# Patient Record
Sex: Male | Born: 1990 | Race: White | Hispanic: No | Marital: Married | Smoking: Current every day smoker
Health system: Southern US, Community
[De-identification: ages and names within clinical notes are randomized; demographics above are authoritative.]

## PROBLEM LIST (undated history)

## (undated) DIAGNOSIS — N179 Acute kidney failure, unspecified: Secondary | ICD-10-CM

## (undated) DIAGNOSIS — M6282 Rhabdomyolysis: Secondary | ICD-10-CM

## (undated) DIAGNOSIS — J45909 Unspecified asthma, uncomplicated: Secondary | ICD-10-CM

## (undated) DIAGNOSIS — K649 Unspecified hemorrhoids: Secondary | ICD-10-CM

## (undated) DIAGNOSIS — N529 Male erectile dysfunction, unspecified: Secondary | ICD-10-CM

## (undated) DIAGNOSIS — F1121 Opioid dependence, in remission: Secondary | ICD-10-CM

## (undated) DIAGNOSIS — F329 Major depressive disorder, single episode, unspecified: Secondary | ICD-10-CM

## (undated) DIAGNOSIS — Z89611 Acquired absence of right leg above knee: Secondary | ICD-10-CM

## (undated) DIAGNOSIS — G935 Compression of brain: Secondary | ICD-10-CM

## (undated) DIAGNOSIS — F4323 Adjustment disorder with mixed anxiety and depressed mood: Secondary | ICD-10-CM

## (undated) DIAGNOSIS — I469 Cardiac arrest, cause unspecified: Secondary | ICD-10-CM

## (undated) DIAGNOSIS — F1910 Other psychoactive substance abuse, uncomplicated: Secondary | ICD-10-CM

## (undated) DIAGNOSIS — T50901A Poisoning by unspecified drugs, medicaments and biological substances, accidental (unintentional), initial encounter: Secondary | ICD-10-CM

## (undated) DIAGNOSIS — J40 Bronchitis, not specified as acute or chronic: Secondary | ICD-10-CM

## (undated) DIAGNOSIS — J690 Pneumonitis due to inhalation of food and vomit: Secondary | ICD-10-CM

## (undated) DIAGNOSIS — R569 Unspecified convulsions: Secondary | ICD-10-CM

## (undated) DIAGNOSIS — Z720 Tobacco use: Secondary | ICD-10-CM

## (undated) DIAGNOSIS — G629 Polyneuropathy, unspecified: Secondary | ICD-10-CM

## (undated) DIAGNOSIS — R251 Tremor, unspecified: Secondary | ICD-10-CM

## (undated) DIAGNOSIS — I10 Essential (primary) hypertension: Secondary | ICD-10-CM

## (undated) DIAGNOSIS — T8859XA Other complications of anesthesia, initial encounter: Secondary | ICD-10-CM

## (undated) DIAGNOSIS — R570 Cardiogenic shock: Secondary | ICD-10-CM

## (undated) DIAGNOSIS — F1410 Cocaine abuse, uncomplicated: Secondary | ICD-10-CM

## (undated) DIAGNOSIS — D649 Anemia, unspecified: Secondary | ICD-10-CM

## (undated) DIAGNOSIS — F411 Generalized anxiety disorder: Secondary | ICD-10-CM

## (undated) DIAGNOSIS — Z8709 Personal history of other diseases of the respiratory system: Secondary | ICD-10-CM

## (undated) HISTORY — PX: OTHER SURGICAL HISTORY: SHX169

## (undated) HISTORY — PX: CERVICAL SPINE SURGERY: SHX589

## (undated) HISTORY — DX: Unspecified convulsions: R56.9

## (undated) HISTORY — DX: Polyneuropathy, unspecified: G62.9

---
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
## 2008-08-13 ENCOUNTER — Emergency Department: Payer: Self-pay | Admitting: Unknown Physician Specialty

## 2008-11-24 ENCOUNTER — Emergency Department: Payer: Self-pay | Admitting: Emergency Medicine

## 2010-04-21 ENCOUNTER — Emergency Department: Payer: Self-pay | Admitting: Emergency Medicine

## 2010-06-03 ENCOUNTER — Emergency Department: Payer: Self-pay | Admitting: Emergency Medicine

## 2010-11-18 ENCOUNTER — Emergency Department: Payer: Self-pay | Admitting: Internal Medicine

## 2011-08-15 ENCOUNTER — Emergency Department: Payer: Self-pay | Admitting: *Deleted

## 2011-08-21 ENCOUNTER — Emergency Department: Payer: Self-pay | Admitting: *Deleted

## 2011-12-07 ENCOUNTER — Emergency Department: Payer: Self-pay | Admitting: Emergency Medicine

## 2011-12-27 DIAGNOSIS — M79609 Pain in unspecified limb: Secondary | ICD-10-CM | POA: Insufficient documentation

## 2012-01-08 ENCOUNTER — Emergency Department: Payer: Self-pay | Admitting: Emergency Medicine

## 2012-02-02 ENCOUNTER — Emergency Department: Payer: Self-pay | Admitting: Emergency Medicine

## 2012-02-25 ENCOUNTER — Emergency Department: Payer: Self-pay | Admitting: *Deleted

## 2012-05-03 ENCOUNTER — Emergency Department: Payer: Self-pay | Admitting: Emergency Medicine

## 2012-05-04 ENCOUNTER — Emergency Department: Payer: Self-pay | Admitting: Emergency Medicine

## 2012-06-06 ENCOUNTER — Emergency Department: Payer: Self-pay | Admitting: Emergency Medicine

## 2012-06-06 LAB — URINALYSIS, COMPLETE
Blood: NEGATIVE
Glucose,UR: NEGATIVE mg/dL (ref 0–75)
Nitrite: NEGATIVE
Ph: 8 (ref 4.5–8.0)
Protein: NEGATIVE
Specific Gravity: 1.012 (ref 1.003–1.030)
WBC UR: <1 /HPF (ref 0–5)

## 2012-06-06 LAB — DRUG SCREEN, URINE
Amphetamines, Ur Screen: NEGATIVE (ref ?–1000)
Cocaine Metabolite,Ur ~~LOC~~: NEGATIVE (ref ?–300)
MDMA (Ecstasy)Ur Screen: NEGATIVE (ref ?–500)
Methadone, Ur Screen: NEGATIVE (ref ?–300)
Opiate, Ur Screen: POSITIVE (ref ?–300)
Phencyclidine (PCP) Ur S: NEGATIVE (ref ?–25)

## 2012-06-06 LAB — CBC
HCT: 47.8 % (ref 40.0–52.0)
HGB: 16.7 g/dL (ref 13.0–18.0)
MCH: 31.9 pg (ref 26.0–34.0)
MCHC: 35 g/dL (ref 32.0–36.0)
MCV: 91 fL (ref 80–100)
RBC: 5.24 10*6/uL (ref 4.40–5.90)
RDW: 12.9 % (ref 11.5–14.5)

## 2012-06-06 LAB — COMPREHENSIVE METABOLIC PANEL
Albumin: 4.7 g/dL (ref 3.4–5.0)
Calcium, Total: 9.1 mg/dL (ref 8.5–10.1)
Chloride: 105 mmol/L (ref 98–107)
Co2: 27 mmol/L (ref 21–32)
EGFR (African American): >60
EGFR (Non-African Amer.): >60
SGOT(AST): 25 U/L (ref 15–37)
SGPT (ALT): 22 U/L (ref 12–78)
Sodium: 142 mmol/L (ref 136–145)

## 2012-06-06 LAB — TSH: Thyroid Stimulating Horm: 0.32 u[IU]/mL — ABNORMAL LOW

## 2012-06-06 LAB — ETHANOL
Ethanol %: <0.003 % (ref 0.000–0.080)
Ethanol: <3 mg/dL

## 2013-02-06 ENCOUNTER — Emergency Department: Payer: Self-pay | Admitting: Emergency Medicine

## 2013-02-19 ENCOUNTER — Emergency Department: Payer: Self-pay | Admitting: Emergency Medicine

## 2013-02-19 LAB — URINALYSIS, COMPLETE
Bilirubin,UR: NEGATIVE
Blood: NEGATIVE
Nitrite: NEGATIVE
Protein: NEGATIVE
Specific Gravity: 1.006 (ref 1.003–1.030)

## 2013-02-19 LAB — COMPREHENSIVE METABOLIC PANEL
Albumin: 4.5 g/dL (ref 3.4–5.0)
Bilirubin,Total: 0.6 mg/dL (ref 0.2–1.0)
Chloride: 108 mmol/L — ABNORMAL HIGH (ref 98–107)
Creatinine: 0.98 mg/dL (ref 0.60–1.30)
EGFR (Non-African Amer.): >60
Glucose: 96 mg/dL (ref 65–99)
Osmolality: 276 (ref 275–301)
Potassium: 4.2 mmol/L (ref 3.5–5.1)
SGPT (ALT): 35 U/L (ref 12–78)
Sodium: 139 mmol/L (ref 136–145)
Total Protein: 7.6 g/dL (ref 6.4–8.2)

## 2013-02-19 LAB — CBC
HCT: 44.4 % (ref 40.0–52.0)
HGB: 15.7 g/dL (ref 13.0–18.0)
MCH: 31.9 pg (ref 26.0–34.0)
MCHC: 35.4 g/dL (ref 32.0–36.0)
MCV: 90 fL (ref 80–100)
Platelet: 282 10*3/uL (ref 150–440)
RBC: 4.92 10*6/uL (ref 4.40–5.90)
RDW: 12.9 % (ref 11.5–14.5)
WBC: 12.5 10*3/uL — ABNORMAL HIGH (ref 3.8–10.6)

## 2013-03-20 ENCOUNTER — Emergency Department: Payer: Self-pay | Admitting: Emergency Medicine

## 2013-03-20 LAB — CBC
HCT: 47 % (ref 40.0–52.0)
HGB: 16.3 g/dL (ref 13.0–18.0)
MCH: 31.3 pg (ref 26.0–34.0)
MCHC: 34.7 g/dL (ref 32.0–36.0)
MCV: 90 fL (ref 80–100)
Platelet: 265 10*3/uL (ref 150–440)
RBC: 5.21 10*6/uL (ref 4.40–5.90)
RDW: 12.5 % (ref 11.5–14.5)
WBC: 11.8 10*3/uL — ABNORMAL HIGH (ref 3.8–10.6)

## 2013-03-20 LAB — URINALYSIS, COMPLETE
Bacteria: NONE SEEN
Bilirubin,UR: NEGATIVE
Glucose,UR: NEGATIVE mg/dL (ref 0–75)
Leukocyte Esterase: NEGATIVE
Nitrite: NEGATIVE
Protein: NEGATIVE
RBC,UR: 1 /HPF (ref 0–5)
Squamous Epithelial: NONE SEEN
WBC UR: 1 /HPF (ref 0–5)

## 2013-03-20 LAB — BASIC METABOLIC PANEL
Calcium, Total: 9.6 mg/dL (ref 8.5–10.1)
Chloride: 104 mmol/L (ref 98–107)
Co2: 27 mmol/L (ref 21–32)
Creatinine: 1.04 mg/dL (ref 0.60–1.30)
EGFR (Non-African Amer.): >60
Glucose: 87 mg/dL (ref 65–99)
Potassium: 4 mmol/L (ref 3.5–5.1)

## 2013-04-17 ENCOUNTER — Emergency Department: Payer: Self-pay | Admitting: Emergency Medicine

## 2013-04-17 LAB — URINALYSIS, COMPLETE
Bacteria: NONE SEEN
Bilirubin,UR: NEGATIVE
Blood: NEGATIVE
Ketone: NEGATIVE
Ph: 6 (ref 4.5–8.0)
RBC,UR: 1 /HPF (ref 0–5)
Specific Gravity: 1.02 (ref 1.003–1.030)
Squamous Epithelial: NONE SEEN

## 2013-04-19 LAB — URINE CULTURE

## 2013-04-20 ENCOUNTER — Emergency Department: Payer: Self-pay | Admitting: Internal Medicine

## 2013-04-20 LAB — URINALYSIS, COMPLETE
Bacteria: NONE SEEN
Glucose,UR: NEGATIVE mg/dL (ref 0–75)
Protein: NEGATIVE
RBC,UR: 67 /HPF (ref 0–5)
Specific Gravity: 1.021 (ref 1.003–1.030)
WBC UR: 5 /HPF (ref 0–5)

## 2013-04-20 LAB — BASIC METABOLIC PANEL
Co2: 26 mmol/L (ref 21–32)
EGFR (African American): >60
EGFR (Non-African Amer.): >60
Potassium: 4.1 mmol/L (ref 3.5–5.1)

## 2013-04-20 LAB — CBC
HGB: 16.3 g/dL (ref 13.0–18.0)
MCV: 91 fL (ref 80–100)
Platelet: 222 10*3/uL (ref 150–440)
RBC: 5.21 10*6/uL (ref 4.40–5.90)

## 2013-06-15 ENCOUNTER — Emergency Department: Payer: Self-pay | Admitting: Emergency Medicine

## 2013-06-17 ENCOUNTER — Emergency Department: Payer: Self-pay | Admitting: Emergency Medicine

## 2013-06-19 LAB — BETA STREP CULTURE(ARMC)

## 2013-07-16 ENCOUNTER — Emergency Department: Payer: Self-pay | Admitting: Emergency Medicine

## 2013-07-17 ENCOUNTER — Emergency Department: Payer: Self-pay | Admitting: Emergency Medicine

## 2013-07-17 LAB — CBC WITH DIFFERENTIAL/PLATELET
Basophil %: 0.3 %
HGB: 15.5 g/dL (ref 13.0–18.0)
Lymphocyte %: 7.2 %
MCH: 31.1 pg (ref 26.0–34.0)
MCV: 91 fL (ref 80–100)
Monocyte #: 1.4 x10 3/mm — ABNORMAL HIGH (ref 0.2–1.0)
Monocyte %: 8.5 %
Neutrophil %: 83.9 %
RBC: 4.98 10*6/uL (ref 4.40–5.90)
RDW: 13 % (ref 11.5–14.5)

## 2013-07-17 LAB — URINALYSIS, COMPLETE
Bilirubin,UR: NEGATIVE
Blood: NEGATIVE
Ketone: NEGATIVE
Leukocyte Esterase: NEGATIVE
Ph: 5 (ref 4.5–8.0)
Protein: NEGATIVE
RBC,UR: 1 /HPF (ref 0–5)
Squamous Epithelial: NONE SEEN
WBC UR: 2 /HPF (ref 0–5)

## 2013-07-17 LAB — COMPREHENSIVE METABOLIC PANEL
Bilirubin,Total: 0.5 mg/dL (ref 0.2–1.0)
Calcium, Total: 9.6 mg/dL (ref 8.5–10.1)
Chloride: 105 mmol/L (ref 98–107)
Creatinine: 0.93 mg/dL (ref 0.60–1.30)
EGFR (African American): >60
Osmolality: 277 (ref 275–301)
Potassium: 4.2 mmol/L (ref 3.5–5.1)
SGOT(AST): 21 U/L (ref 15–37)
SGPT (ALT): 22 U/L (ref 12–78)

## 2013-07-18 ENCOUNTER — Emergency Department (HOSPITAL_COMMUNITY)
Admission: EM | Admit: 2013-07-18 | Discharge: 2013-07-18 | Discharge status: Against Medical Advice | Payer: Self-pay | Attending: Emergency Medicine | Admitting: Emergency Medicine

## 2013-07-18 ENCOUNTER — Other Ambulatory Visit: Payer: Self-pay

## 2013-07-18 ENCOUNTER — Encounter (HOSPITAL_COMMUNITY): Payer: Self-pay | Admitting: Emergency Medicine

## 2013-07-18 DIAGNOSIS — R0789 Other chest pain: Secondary | ICD-10-CM | POA: Insufficient documentation

## 2013-07-18 DIAGNOSIS — R1031 Right lower quadrant pain: Secondary | ICD-10-CM | POA: Insufficient documentation

## 2013-07-18 DIAGNOSIS — R42 Dizziness and giddiness: Secondary | ICD-10-CM | POA: Insufficient documentation

## 2013-07-18 DIAGNOSIS — F17200 Nicotine dependence, unspecified, uncomplicated: Secondary | ICD-10-CM | POA: Insufficient documentation

## 2013-07-18 NOTE — ED Notes (Signed)
Pt states that since yesterday has been having dizziness, reports feet gave out on him, leg pain, and burning in his chest.  Pt reports RLQ pain since this am that is stabbing

## 2013-07-18 NOTE — ED Notes (Signed)
Unable to locate patient for room x3 

## 2013-10-09 ENCOUNTER — Emergency Department: Payer: Self-pay | Admitting: Emergency Medicine

## 2013-10-18 ENCOUNTER — Emergency Department: Payer: Self-pay | Admitting: Emergency Medicine

## 2013-10-18 LAB — CBC
HCT: 46.5 % (ref 40.0–52.0)
HGB: 15.9 g/dL (ref 13.0–18.0)
MCH: 31.4 pg (ref 26.0–34.0)
MCHC: 34.1 g/dL (ref 32.0–36.0)
MCV: 92 fL (ref 80–100)
PLATELETS: 248 10*3/uL (ref 150–440)
RBC: 5.05 10*6/uL (ref 4.40–5.90)
RDW: 12.6 % (ref 11.5–14.5)
WBC: 9.6 10*3/uL (ref 3.8–10.6)

## 2013-10-18 LAB — URINALYSIS, COMPLETE
BACTERIA: NONE SEEN
BLOOD: NEGATIVE
Bilirubin,UR: NEGATIVE
Glucose,UR: NEGATIVE mg/dL (ref 0–75)
Ketone: NEGATIVE
Leukocyte Esterase: NEGATIVE
Nitrite: NEGATIVE
PH: 6 (ref 4.5–8.0)
Protein: NEGATIVE
RBC,UR: <1 /HPF (ref 0–5)
Specific Gravity: 1.016 (ref 1.003–1.030)
Squamous Epithelial: NONE SEEN

## 2013-10-18 LAB — COMPREHENSIVE METABOLIC PANEL
ALK PHOS: 46 U/L
AST: 29 U/L (ref 15–37)
Albumin: 4.5 g/dL (ref 3.4–5.0)
Anion Gap: 5 — ABNORMAL LOW (ref 7–16)
BILIRUBIN TOTAL: 0.3 mg/dL (ref 0.2–1.0)
BUN: 10 mg/dL (ref 7–18)
CALCIUM: 9.1 mg/dL (ref 8.5–10.1)
CREATININE: 1.04 mg/dL (ref 0.60–1.30)
Chloride: 107 mmol/L (ref 98–107)
Co2: 26 mmol/L (ref 21–32)
EGFR (African American): >60
EGFR (Non-African Amer.): >60
Glucose: 87 mg/dL (ref 65–99)
Osmolality: 274 (ref 275–301)
POTASSIUM: 3.9 mmol/L (ref 3.5–5.1)
SGPT (ALT): 25 U/L (ref 12–78)
Sodium: 138 mmol/L (ref 136–145)
Total Protein: 8.1 g/dL (ref 6.4–8.2)

## 2013-10-18 LAB — DRUG SCREEN, URINE
AMPHETAMINES, UR SCREEN: NEGATIVE (ref ?–1000)
Barbiturates, Ur Screen: NEGATIVE (ref ?–200)
Benzodiazepine, Ur Scrn: NEGATIVE (ref ?–200)
COCAINE METABOLITE, UR ~~LOC~~: NEGATIVE (ref ?–300)
Cannabinoid 50 Ng, Ur ~~LOC~~: NEGATIVE (ref ?–50)
MDMA (Ecstasy)Ur Screen: NEGATIVE (ref ?–500)
METHADONE, UR SCREEN: NEGATIVE (ref ?–300)
OPIATE, UR SCREEN: POSITIVE (ref ?–300)
PHENCYCLIDINE (PCP) UR S: NEGATIVE (ref ?–25)
Tricyclic, Ur Screen: NEGATIVE (ref ?–1000)

## 2013-10-18 LAB — ETHANOL
Ethanol %: <0.003 % (ref 0.000–0.080)
Ethanol: <3 mg/dL

## 2013-10-18 LAB — ACETAMINOPHEN LEVEL

## 2013-10-18 LAB — SALICYLATE LEVEL: Salicylates, Serum: 2.7 mg/dL

## 2014-03-16 ENCOUNTER — Emergency Department (HOSPITAL_COMMUNITY)
Admission: EM | Admit: 2014-03-16 | Discharge: 2014-03-16 | Disposition: A | Discharge status: Home / Self Care | Payer: No Typology Code available for payment source | Attending: Emergency Medicine | Admitting: Emergency Medicine

## 2014-03-16 ENCOUNTER — Encounter (HOSPITAL_COMMUNITY): Payer: Self-pay | Admitting: Emergency Medicine

## 2014-03-16 DIAGNOSIS — K0510 Chronic gingivitis, plaque induced: Secondary | ICD-10-CM

## 2014-03-16 DIAGNOSIS — K044 Acute apical periodontitis of pulpal origin: Secondary | ICD-10-CM | POA: Insufficient documentation

## 2014-03-16 DIAGNOSIS — F17200 Nicotine dependence, unspecified, uncomplicated: Secondary | ICD-10-CM | POA: Diagnosis not present

## 2014-03-16 DIAGNOSIS — K047 Periapical abscess without sinus: Secondary | ICD-10-CM

## 2014-03-16 DIAGNOSIS — K029 Dental caries, unspecified: Secondary | ICD-10-CM

## 2014-03-16 DIAGNOSIS — K089 Disorder of teeth and supporting structures, unspecified: Secondary | ICD-10-CM | POA: Diagnosis present

## 2014-03-16 MED ORDER — AMOXICILLIN 500 MG PO CAPS: 500.0000 mg | ORAL_CAPSULE | Freq: Three times a day (TID) | ORAL | Status: DC

## 2014-03-16 MED ORDER — IBUPROFEN 800 MG PO TABS: 800.0000 mg | ORAL_TABLET | Freq: Three times a day (TID) | ORAL | Status: DC

## 2014-03-16 MED ORDER — ACETAMINOPHEN-CODEINE #3 300-30 MG PO TABS: 1.0000 | ORAL_TABLET | Freq: Four times a day (QID) | ORAL | Status: DC | PRN

## 2014-03-16 NOTE — ED Provider Notes (Signed)
CSN: [id number]     Arrival date & time 03/16/14  1312 History  This chart was scribed for non-physician practitioner Johnnette Gourd, working with Geoffery Lyons, MD by Carl Best, ED Scribe. This patient was seen in room TR08C/TR08C and the patient's care was started at 1:24 PM.    No chief complaint on file.  The history is provided by the patient. No language interpreter was used.   HPI Comments: Joshua [name] is a 22 y.o. male who presents to the Emergency Department complaining of constant lower dental pain that started 3 days ago.  He states that he cracked his left lower molar prior to the onset of his pain and believes he has an infection in his gums.  He states that he has taken Tylenol with no relief to his symptoms.  He denies fever and trouble swallowing as associated symptoms.  The patient states that he cannot make an appointment with his dentist because he is on vacation.  He is a smoker and allergic to codeine.     History reviewed. No pertinent past medical history. Past Surgical History  Procedure Laterality Date  . Ankle/foot surgery with metal plates     No family history on file. History  Substance Use Topics  . Smoking status: Current Every Day Smoker  . Smokeless tobacco: Not on file  . Alcohol Use: No     Comment: occ    Review of Systems  Constitutional: Negative for fever.  HENT: Positive for dental problem. Negative for trouble swallowing.   All other systems reviewed and are negative.     Allergies  Review of patient's allergies indicates no known allergies.  Home Medications   Prior to Admission medications   Medication Sig Start Date End Date Taking? Authorizing Provider  acetaminophen-codeine (TYLENOL #3) 300-30 MG per tablet Take 1-2 tablets by mouth every 6 (six) hours as needed for moderate pain. 03/16/14   Trevor Mace, PA-C  amoxicillin (AMOXIL) 500 MG capsule Take 1 capsule (500 mg total) by mouth 3 (three) times daily. 03/16/14   Trevor Mace, PA-C  ibuprofen (ADVIL,MOTRIN) 800 MG tablet Take 1 tablet (800 mg total) by mouth 3 (three) times daily. 03/16/14   Trevor Mace, PA-C   Triage Vitals: BP 138/85  Pulse 78  Temp(Src) 99 F (37.2 C) (Oral)  Resp 18  SpO2 100%  Physical Exam  Nursing note and vitals reviewed. Constitutional: He is oriented to person, place, and time. He appears well-developed and well-nourished.  HENT:  Head: Normocephalic and atraumatic.  Poor dentition with multiple carries.  Chipped left lower second molar with surrounding erythema and edema.  No abscess.   Dark discoloration to the front lower aspect of his gingiva.    Eyes: EOM are normal.  Neck: Normal range of motion.  Cardiovascular: Normal rate.   Pulmonary/Chest: Effort normal.  Musculoskeletal: Normal range of motion.  Neurological: He is alert and oriented to person, place, and time.  Skin: Skin is warm and dry.  Psychiatric: He has a normal mood and affect. His behavior is normal.    ED Course  Procedures (including critical care time)  DIAGNOSTIC STUDIES: Oxygen Saturation is 100% on room air, normal by my interpretation.    COORDINATION OF CARE: 1:25 PM- Discussed discharging the patient with antibiotic and pain medication and the patient agreed to the treatment plan.   Labs Review Labs Reviewed - No data to display  Imaging Review No results found.   EKG  Interpretation None      MDM   Final diagnoses:  Dental caries  Gingivitis  Dental infection     Dental pain associated with dental infection, door dentition and gingivitis. No evidence of dental abscess. Patient is afebrile, non toxic appearing and swallowing secretions well. I gave patient referral to dentist and stressed the importance of dental follow up for ultimate management of dental pain. I will also give amoxicillin and pain control. Patient voices understanding and is agreeable to plan.   I personally performed the services described in this  documentation, which was scribed in my presence. The recorded information has been reviewed and is accurate.   Trevor MaceRobyn M Albert, PA-C 03/16/14 1336

## 2014-03-16 NOTE — Discharge Instructions (Signed)
Take antibiotic to completion. Take Tylenol #3 for severe pain only. No driving or operating heavy machinery while taking Tylenol #3. This medication may cause drowsiness. Follow up with the dentist.  Dental Care and Dentist Visits Dental care supports good overall health. Regular dental visits can also help you avoid dental pain, bleeding, infection, and other more serious health problems in the future. It is important to keep the mouth healthy because diseases in the teeth, gums, and other oral tissues can spread to other areas of the body. Some problems, such as diabetes, heart disease, and pre-term labor have been associated with poor oral health.  See your dentist every 6 months. If you experience emergency problems such as a toothache or broken tooth, go to the dentist right away. If you see your dentist regularly, you may catch problems early. It is easier to be treated for problems in the early stages.  WHAT TO EXPECT AT A DENTIST VISIT  Your dentist will look for many common oral health problems and recommend proper treatment. At your regular dental visit, you can expect:  Gentle cleaning of the teeth and gums. This includes scraping and polishing. This helps to remove the sticky substance around the teeth and gums (plaque). Plaque forms in the mouth shortly after eating. Over time, plaque hardens on the teeth as tartar. If tartar is not removed regularly, it can cause problems. Cleaning also helps remove stains.  Periodic X-rays. These pictures of the teeth and supporting bone will help your dentist assess the health of your teeth.  Periodic fluoride treatments. Fluoride is a natural mineral shown to help strengthen teeth. Fluoride treatmentinvolves applying a fluoride gel or varnish to the teeth. It is most commonly done in children.  Examination of the mouth, tongue, jaws, teeth, and gums to look for any oral health problems, such as:  Cavities (dental caries). This is decay on the  tooth caused by plaque, sugar, and acid in the mouth. It is best to catch a cavity when it is small.  Inflammation of the gums caused by plaque buildup (gingivitis).  Problems with the mouth or malformed or misaligned teeth.  Oral cancer or other diseases of the soft tissues or jaws. KEEP YOUR TEETH AND GUMS HEALTHY For healthy teeth and gums, follow these general guidelines as well as your dentist's specific advice:  Have your teeth professionally cleaned at the dentist every 6 months.  Brush twice daily with a fluoride toothpaste.  Floss your teeth daily.  Ask your dentist if you need fluoride supplements, treatments, or fluoride toothpaste.  Eat a healthy diet. Reduce foods and drinks with added sugar.  Avoid smoking. TREATMENT FOR ORAL HEALTH PROBLEMS If you have oral health problems, treatment varies depending on the conditions present in your teeth and gums.  Your caregiver will most likely recommend good oral hygiene at each visit.  For cavities, gingivitis, or other oral health disease, your caregiver will perform a procedure to treat the problem. This is typically done at a separate appointment. Sometimes your caregiver will refer you to another dental specialist for specific tooth problems or for surgery. SEEK IMMEDIATE DENTAL CARE IF:  You have pain, bleeding, or soreness in the gum, tooth, jaw, or mouth area.  A permanent tooth becomes loose or separated from the gum socket.  You experience a blow or injury to the mouth or jaw area. Document Released: 04/23/2011 Document Revised: 11/03/2011 Document Reviewed: 04/23/2011 [facility] Patient Information 2015 [place]. This information is not intended to  replace advice given to you by your health care provider. Make sure you discuss any questions you have with your health care provider.  Dental Caries Dental caries (also called tooth decay) is the most common oral disease. It can occur at any age but is more  common in children and young adults.  HOW DENTAL CARIES DEVELOPS  The process of decay begins when bacteria and foods (particularly sugars and starches) combine in your mouth to produce plaque. Plaque is a substance that sticks to the hard, outer surface of a tooth (enamel). The bacteria in plaque produce acids that attack enamel. These acids may also attack the root surface of a tooth (cementum) if it is exposed. Repeated attacks dissolve these surfaces and create holes in the tooth (cavities). If left untreated, the acids destroy the other layers of the tooth.  RISK FACTORS  Frequent sipping of sugary beverages.   Frequent snacking on sugary and starchy foods, especially those that easily get stuck in the teeth.   Poor oral hygiene.   Dry mouth.   Substance abuse such as methamphetamine abuse.   Broken or poor-fitting dental restorations.   Eating disorders.   Gastroesophageal reflux disease (GERD).   Certain radiation treatments to the head and neck. SYMPTOMS In the early stages of dental caries, symptoms are seldom present. Sometimes white, chalky areas may be seen on the enamel or other tooth layers. In later stages, symptoms may include:  Pits and holes on the enamel.  Toothache after sweet, hot, or cold foods or drinks are consumed.  Pain around the tooth.  Swelling around the tooth. DIAGNOSIS  Most of the time, dental caries is detected during a regular dental checkup. A diagnosis is made after a thorough medical and dental history is taken and the surfaces of your teeth are checked for signs of dental caries. Sometimes special instruments, such as lasers, are used to check for dental caries. Dental X-ray exams may be taken so that areas not visible to the eye (such as between the contact areas of the teeth) can be checked for cavities.  TREATMENT  If dental caries is in its early stages, it may be reversed with a fluoride treatment or an application of a  remineralizing agent at the dental office. Thorough brushing and flossing at home is needed to aid these treatments. If it is in its later stages, treatment depends on the location and extent of tooth destruction:   If a small area of the tooth has been destroyed, the destroyed area will be removed and cavities will be filled with a material such as gold, silver amalgam, or composite resin.   If a large area of the tooth has been destroyed, the destroyed area will be removed and a cap (crown) will be fitted over the remaining tooth structure.   If the center part of the tooth (pulp) is affected, a procedure called a root canal will be needed before a filling or crown can be placed.   If most of the tooth has been destroyed, the tooth may need to be pulled (extracted). HOME CARE INSTRUCTIONS You can prevent, stop, or reverse dental caries at home by practicing good oral hygiene. Good oral hygiene includes:  Thoroughly cleaning your teeth at least twice a day with a toothbrush and dental floss.   Using a fluoride toothpaste. A fluoride mouth rinse may also be used if recommended by your dentist or health care provider.   Restricting the amount of sugary and starchy foods  and sugary liquids you consume.   Avoiding frequent snacking on these foods and sipping of these liquids.   Keeping regular visits with a dentist for checkups and cleanings. PREVENTION   Practice good oral hygiene.  Consider a dental sealant. A dental sealant is a coating material that is applied by your dentist to the pits and grooves of teeth. The sealant prevents food from being trapped in them. It may protect the teeth for several years.  Ask about fluoride supplements if you live in a community without fluorinated water or with water that has a low fluoride content. Use fluoride supplements as directed by your dentist or health care provider.  Allow fluoride varnish applications to teeth if directed by your  dentist or health care provider. Document Released: 05/03/2002 Document Revised: 12/26/2013 Document Reviewed: 08/13/2012 [facility] Patient Information 2015 [place], [place]. This information is not intended to replace advice given to you by your health care provider. Make sure you discuss any questions you have with your health care provider.  Dental Pain A tooth ache may be caused by cavities (tooth decay). Cavities expose the nerve of the tooth to air and hot or cold temperatures. It may come from an infection or abscess (also called a boil or furuncle) around your tooth. It is also often caused by dental caries (tooth decay). This causes the pain you are having. DIAGNOSIS  Your caregiver can diagnose this problem by exam. TREATMENT   If caused by an infection, it may be treated with medications which kill germs (antibiotics) and pain medications as prescribed by your caregiver. Take medications as directed.  Only take over-the-counter or prescription medicines for pain, discomfort, or fever as directed by your caregiver.  Whether the tooth ache today is caused by infection or dental disease, you should see your dentist as soon as possible for further care. SEEK MEDICAL CARE IF: The exam and treatment you received today has been provided on an emergency basis only. This is not a substitute for complete medical or dental care. If your problem worsens or new problems (symptoms) appear, and you are unable to meet with your dentist, call or return to this location. SEEK IMMEDIATE MEDICAL CARE IF:   You have a fever.  You develop redness and swelling of your face, jaw, or neck.  You are unable to open your mouth.  You have severe pain uncontrolled by pain medicine. MAKE SURE YOU:   Understand these instructions.  Will watch your condition.  Will get help right away if you are not doing well or get worse. Document Released: 08/11/2005 Document Revised: 11/03/2011 Document Reviewed:  03/29/2008 [facility] Patient Information 2015 [place], [place]. This information is not intended to replace advice given to you by your health care provider. Make sure you discuss any questions you have with your health care provider.  Gingivitis Gingivitis is a form of gum (periodontal) disease that causes redness, soreness, and swelling (inflammation) of your gums. CAUSES The most common cause of gingivitis is poor oral hygiene. A sticky substance made of bacteria, mucus, and food particles (plaque), is deposited on the exposed part of teeth. As plaque builds up, it reacts with the saliva in your mouth to form something called  tartar. Tartar is a hard deposit that becomes trapped around the base of the tooth. Plaque and tartar irritate the gums, leading to the formation of gingivitis. Other factors that increase your risk for gingivitis include:   Tobacco use.  Diabetes.  Older age.  Certain medications.  Certain viral or fungal infections.  Dry mouth.  Hormonal changes such as during pregnancy.  Poor nutrition.  Substance abuse.  Poor fitting dental restorations or appliances. SYMPTOMS You may notice inflammation of the soft tissue (gingiva) around the teeth. When these tissues become inflamed, they bleed easily, especially during flossing or brushing. The gums may also be:   Tender to the touch.  Bright red, purple red, or have a shiny appearance.  Swollen.  Wearing away from the teeth (receding), which exposes more of the tooth. Bad breath is often present. Continued infection around teeth can eventually cause cavities and loosen teeth. This may lead to eventual tooth loss. DIAGNOSIS A medical and dental history will be taken. Your mouth, teeth, and gums will be examined. Your dentist will look for soft, swollen purple-red, irritated gums. There may be deposits of plaque and tartar at the base of the teeth. Your gums will be looked at for the degree of redness, puffiness,  and bleeding tendencies. Your dentist will see if any of the teeth are loose. X-rays may be taken to see if the inflammation has spread to the supporting structures of the teeth. TREATMENT The goal is to reduce and reverse the inflammation. Proper treatment can usually reverse the symptoms of gingivitis and prevent further progression of the disease. Have your teeth cleaned. During the cleaning, all plaque and tartar will be removed. Instruction for proper home care will be given. You will need regular professional cleanings and check-ups in the future. HOME CARE INSTRUCTIONS  Brush your teeth twice a day and floss at least once per day. When flossing, it is best to floss first then brush.  Limit sugar between meals and maintain a well-balanced diet.  Even the best dental hygiene will not prevent plaque from developing. It is necessary for you to see your dentist on a regular basis for cleaning and regular checkups.  Your dentist can recommend proper oral hygiene and mouth care and suggest special toothpastes or mouth rinses.  Stop smoking. SEEK DENTAL OR MEDICAL CARE IF:  You have painful, reddened tissue around your teeth, or you have puffy swollen gums.  You have difficulty chewing.  You notice any loose or infected teeth.  You have swollen glands.  Your gums bleed easily when you brush your teeth or are very tender to the touch. Document Released: 02/04/2001 Document Revised: 11/03/2011 Document Reviewed: 11/15/2010 [facility] Patient Information 2015 [place], [place]. This information is not intended to replace advice given to you by your health care provider. Make sure you discuss any questions you have with your health care provider.

## 2014-03-16 NOTE — ED Notes (Signed)
Pt c/o painful gums x 2-3 days. Also reports a broken molar, left lower.

## 2014-03-17 NOTE — ED Provider Notes (Signed)
Medical screening examination/treatment/procedure(s) were performed by non-physician practitioner and as supervising physician I was immediately available for consultation/collaboration.     Sindee Stucker, MD 03/17/14 1527 

## 2014-03-26 ENCOUNTER — Emergency Department: Payer: Self-pay | Admitting: Emergency Medicine

## 2014-03-26 LAB — URINALYSIS, COMPLETE
BLOOD: NEGATIVE
Bacteria: NONE SEEN
Bilirubin,UR: NEGATIVE
Glucose,UR: NEGATIVE mg/dL (ref 0–75)
Ketone: NEGATIVE
Leukocyte Esterase: NEGATIVE
NITRITE: NEGATIVE
PROTEIN: NEGATIVE
Ph: 6 (ref 4.5–8.0)
RBC,UR: 1 /HPF (ref 0–5)
Specific Gravity: 1.02 (ref 1.003–1.030)
Squamous Epithelial: NONE SEEN
WBC UR: <1 /HPF (ref 0–5)

## 2014-03-26 LAB — GC/CHLAMYDIA PROBE AMP

## 2014-05-02 ENCOUNTER — Emergency Department: Payer: Self-pay | Admitting: Student

## 2014-05-08 ENCOUNTER — Emergency Department: Payer: Self-pay | Admitting: Emergency Medicine

## 2014-05-08 LAB — CBC WITH DIFFERENTIAL/PLATELET
BASOS ABS: 0.2 10*3/uL — AB (ref 0.0–0.1)
Basophil %: 1.3 %
EOS ABS: 0.4 10*3/uL (ref 0.0–0.7)
Eosinophil %: 3.3 %
HCT: 48 % (ref 40.0–52.0)
HGB: 16.2 g/dL (ref 13.0–18.0)
LYMPHS ABS: 3.9 10*3/uL — AB (ref 1.0–3.6)
Lymphocyte %: 28.4 %
MCH: 31.4 pg (ref 26.0–34.0)
MCHC: 33.7 g/dL (ref 32.0–36.0)
MCV: 93 fL (ref 80–100)
Monocyte #: 1.1 x10 3/mm — ABNORMAL HIGH (ref 0.2–1.0)
Monocyte %: 7.7 %
Neutrophil #: 8.1 10*3/uL — ABNORMAL HIGH (ref 1.4–6.5)
Neutrophil %: 59.3 %
Platelet: 297 10*3/uL (ref 150–440)
RBC: 5.16 10*6/uL (ref 4.40–5.90)
RDW: 12.4 % (ref 11.5–14.5)
WBC: 13.7 10*3/uL — ABNORMAL HIGH (ref 3.8–10.6)

## 2014-05-08 LAB — COMPREHENSIVE METABOLIC PANEL
AST: 27 U/L (ref 15–37)
Albumin: 4.1 g/dL (ref 3.4–5.0)
Alkaline Phosphatase: 51 U/L
Anion Gap: 7 (ref 7–16)
BUN: 19 mg/dL — ABNORMAL HIGH (ref 7–18)
Bilirubin,Total: 0.2 mg/dL (ref 0.2–1.0)
CREATININE: 1.02 mg/dL (ref 0.60–1.30)
Calcium, Total: 9.6 mg/dL (ref 8.5–10.1)
Chloride: 103 mmol/L (ref 98–107)
Co2: 31 mmol/L (ref 21–32)
EGFR (African American): >60
EGFR (Non-African Amer.): >60
Glucose: 95 mg/dL (ref 65–99)
Osmolality: 283 (ref 275–301)
POTASSIUM: 4 mmol/L (ref 3.5–5.1)
SGPT (ALT): 37 U/L
Sodium: 141 mmol/L (ref 136–145)
Total Protein: 7.5 g/dL (ref 6.4–8.2)

## 2014-05-08 LAB — D-DIMER(ARMC): D-Dimer: 70 ng/ml

## 2014-05-08 LAB — TROPONIN I: Troponin-I: <0.02 ng/mL

## 2014-05-08 LAB — LIPASE, BLOOD: LIPASE: 147 U/L (ref 73–393)

## 2014-05-09 LAB — URINALYSIS, COMPLETE
BACTERIA: NONE SEEN
BILIRUBIN, UR: NEGATIVE
Blood: NEGATIVE
Glucose,UR: NEGATIVE mg/dL (ref 0–75)
Ketone: NEGATIVE
Leukocyte Esterase: NEGATIVE
Nitrite: NEGATIVE
PROTEIN: NEGATIVE
Ph: 8 (ref 4.5–8.0)
RBC,UR: 2 /HPF (ref 0–5)
SPECIFIC GRAVITY: 1.012 (ref 1.003–1.030)
Squamous Epithelial: NONE SEEN
WBC UR: NONE SEEN /HPF (ref 0–5)

## 2014-07-10 ENCOUNTER — Emergency Department: Payer: Self-pay | Admitting: Emergency Medicine

## 2014-11-09 ENCOUNTER — Emergency Department: Payer: Self-pay | Admitting: Emergency Medicine

## 2014-12-19 ENCOUNTER — Emergency Department
Admit: 2014-12-19 | Disposition: A | Discharge status: Home / Self Care | Payer: Self-pay | Admitting: Emergency Medicine

## 2015-05-21 ENCOUNTER — Encounter: Payer: Self-pay | Admitting: Emergency Medicine

## 2015-05-21 ENCOUNTER — Other Ambulatory Visit: Payer: Self-pay

## 2015-05-21 ENCOUNTER — Emergency Department
Admission: EM | Admit: 2015-05-21 | Discharge: 2015-05-21 | Disposition: A | Discharge status: Home / Self Care | Payer: 59 | Attending: Emergency Medicine | Admitting: Emergency Medicine

## 2015-05-21 ENCOUNTER — Emergency Department: Payer: 59

## 2015-05-21 DIAGNOSIS — J069 Acute upper respiratory infection, unspecified: Secondary | ICD-10-CM | POA: Diagnosis not present

## 2015-05-21 DIAGNOSIS — Z791 Long term (current) use of non-steroidal anti-inflammatories (NSAID): Secondary | ICD-10-CM | POA: Insufficient documentation

## 2015-05-21 DIAGNOSIS — J45909 Unspecified asthma, uncomplicated: Secondary | ICD-10-CM | POA: Diagnosis not present

## 2015-05-21 DIAGNOSIS — R05 Cough: Secondary | ICD-10-CM | POA: Diagnosis present

## 2015-05-21 DIAGNOSIS — Z720 Tobacco use: Secondary | ICD-10-CM | POA: Insufficient documentation

## 2015-05-21 DIAGNOSIS — Z792 Long term (current) use of antibiotics: Secondary | ICD-10-CM | POA: Insufficient documentation

## 2015-05-21 DIAGNOSIS — Z79899 Other long term (current) drug therapy: Secondary | ICD-10-CM | POA: Diagnosis not present

## 2015-05-21 HISTORY — DX: Unspecified asthma, uncomplicated: J45.909

## 2015-05-21 HISTORY — DX: Bronchitis, not specified as acute or chronic: J40

## 2015-05-21 LAB — URINALYSIS COMPLETE WITH MICROSCOPIC (ARMC ONLY)
BACTERIA UA: NONE SEEN
BILIRUBIN URINE: NEGATIVE
Glucose, UA: NEGATIVE mg/dL
Hgb urine dipstick: NEGATIVE
Ketones, ur: NEGATIVE mg/dL
Leukocytes, UA: NEGATIVE
NITRITE: NEGATIVE
Protein, ur: NEGATIVE mg/dL
Specific Gravity, Urine: 1.013 (ref 1.005–1.030)
Squamous Epithelial / LPF: NONE SEEN
pH: 5 (ref 5.0–8.0)

## 2015-05-21 MED ORDER — IPRATROPIUM-ALBUTEROL 0.5-2.5 (3) MG/3ML IN SOLN
3.0000 mL | Freq: Once | RESPIRATORY_TRACT | Status: AC
  Administered 2015-05-21: 3 mL via RESPIRATORY_TRACT
  Filled 2015-05-21: qty 3

## 2015-05-21 MED ORDER — BENZONATATE 100 MG PO CAPS: 100.0000 mg | ORAL_CAPSULE | Freq: Three times a day (TID) | ORAL | Status: DC | PRN

## 2015-05-21 MED ORDER — ALBUTEROL SULFATE HFA 108 (90 BASE) MCG/ACT IN AERS: 2.0000 | INHALATION_SPRAY | Freq: Four times a day (QID) | RESPIRATORY_TRACT | Status: DC | PRN

## 2015-05-21 NOTE — Discharge Instructions (Signed)
Upper Respiratory Infection, Adult An upper respiratory infection (URI) is also sometimes known as the common cold. The upper respiratory tract includes the nose, sinuses, throat, trachea, and bronchi. Bronchi are the airways leading to the lungs. Most people improve within 1 week, but symptoms can last up to 2 weeks. A residual cough may last even longer.  CAUSES Many different viruses can infect the tissues lining the upper respiratory tract. The tissues become irritated and inflamed and often become very moist. Mucus production is also common. A cold is contagious. You can easily spread the virus to others by oral contact. This includes kissing, sharing a glass, coughing, or sneezing. Touching your mouth or nose and then touching a surface, which is then touched by another person, can also spread the virus. SYMPTOMS  Symptoms typically develop 1 to 3 days after you come in contact with a cold virus. Symptoms vary from person to person. They may include:  Runny nose.  Sneezing.  Nasal congestion.  Sinus irritation.  Sore throat.  Loss of voice (laryngitis).  Cough.  Fatigue.  Muscle aches.  Loss of appetite.  Headache.  Low-grade fever. DIAGNOSIS  You might diagnose your own cold based on familiar symptoms, since most people get a cold 2 to 3 times a year. Your caregiver can confirm this based on your exam. Most importantly, your caregiver can check that your symptoms are not due to another disease such as strep throat, sinusitis, pneumonia, asthma, or epiglottitis. Blood tests, throat tests, and X-rays are not necessary to diagnose a common cold, but they may sometimes be helpful in excluding other more serious diseases. Your caregiver will decide if any further tests are required. RISKS AND COMPLICATIONS  You may be at risk for a more severe case of the common cold if you smoke cigarettes, have chronic heart disease (such as heart failure) or lung disease (such as asthma), or if  you have a weakened immune system. The very young and very old are also at risk for more serious infections. Bacterial sinusitis, middle ear infections, and bacterial pneumonia can complicate the common cold. The common cold can worsen asthma and chronic obstructive pulmonary disease (COPD). Sometimes, these complications can require emergency medical care and may be life-threatening. PREVENTION  The best way to protect against getting a cold is to practice good hygiene. Avoid oral or hand contact with people with cold symptoms. Wash your hands often if contact occurs. There is no clear evidence that vitamin C, vitamin E, echinacea, or exercise reduces the chance of developing a cold. However, it is always recommended to get plenty of rest and practice good nutrition. TREATMENT  Treatment is directed at relieving symptoms. There is no cure. Antibiotics are not effective, because the infection is caused by a virus, not by bacteria. Treatment may include:  Increased fluid intake. Sports drinks offer valuable electrolytes, sugars, and fluids.  Breathing heated mist or steam (vaporizer or shower).  Eating chicken soup or other clear broths, and maintaining good nutrition.  Getting plenty of rest.  Using gargles or lozenges for comfort.  Controlling fevers with ibuprofen or acetaminophen as directed by your caregiver.  Increasing usage of your inhaler if you have asthma. Zinc gel and zinc lozenges, taken in the first 24 hours of the common cold, can shorten the duration and lessen the severity of symptoms. Pain medicines may help with fever, muscle aches, and throat pain. A variety of non-prescription medicines are available to treat congestion and runny nose. Your caregiver   can make recommendations and may suggest nasal or lung inhalers for other symptoms.  HOME CARE INSTRUCTIONS   Only take over-the-counter or prescription medicines for pain, discomfort, or fever as directed by your  caregiver.  Use a warm mist humidifier or inhale steam from a shower to increase air moisture. This may keep secretions moist and make it easier to breathe.  Drink enough water and fluids to keep your urine clear or pale yellow.  Rest as needed.  Return to work when your temperature has returned to normal or as your caregiver advises. You may need to stay home longer to avoid infecting others. You can also use a face mask and careful hand washing to prevent spread of the virus. SEEK MEDICAL CARE IF:   After the first few days, you feel you are getting worse rather than better.  You need your caregiver's advice about medicines to control symptoms.  You develop chills, worsening shortness of breath, or brown or red sputum. These may be signs of pneumonia.  You develop yellow or brown nasal discharge or pain in the face, especially when you bend forward. These may be signs of sinusitis.  You develop a fever, swollen neck glands, pain with swallowing, or white areas in the back of your throat. These may be signs of strep throat. SEEK IMMEDIATE MEDICAL CARE IF:   You have a fever.  You develop severe or persistent headache, ear pain, sinus pain, or chest pain.  You develop wheezing, a prolonged cough, cough up blood, or have a change in your usual mucus (if you have chronic lung disease).  You develop sore muscles or a stiff neck. Document Released: 02/04/2001 Document Revised: 11/03/2011 Document Reviewed: 11/16/2013 [employer] Patient Information 2015 [employer]. This information is not intended to replace advice given to you by your health care provider. Make sure you discuss any questions you have with your health care provider.  

## 2015-05-21 NOTE — ED Notes (Signed)
Pt presents with cough, shortness of breath. Pt with hx of bronchitis. Pt wants antibiotics, does not have PCP.

## 2015-05-21 NOTE — ED Provider Notes (Signed)
[facility] Emergency Department Provider Note  ____________________________________________  Time seen: Approximately 8:42 AM  I have reviewed the triage vital signs and the nursing notes.   HISTORY  Chief Complaint Cough and Bronchitis    HPI Joshua [name] is a 24 y.o. male presenting with cough and SOB since yesterday. Patient has history of asthma and bronchitis. Patient states that he is out of his inhaler and he does not have a PCP. Patient states that he has a pain in the middle of his chest when he takes a deep breath that feels like a "stab with a knife" and "like a cinder block is sitting on my chest." The pain does not radiate. Pain is worsened with dry cough and makes him feel SOB. Patient has taken Tylenol and Ibuprofen that do not relieve the symptoms. Patient denies trauma to the chest. Patient denies wheezing and tongue/throat swelling. Patient denies sick contacts and recent travel. Patient is UTD on vaccinations. Patient denies fever, chills, nasal congestion, sore throat, diarrhea, and constipation. Patient smokes 1 ppd.   Patient states he has been having acid reflux that causes him to vomit daily for the last few weeks. He takes an OTC generic Tums that mildly relieves symptoms. With the acid reflux he occasionally has nausea and a decreased appetite.    Past Medical History  Diagnosis Date  . Bronchitis   . Asthma     There are no active problems to display for this patient.   Past Surgical History  Procedure Laterality Date  . Ankle/foot surgery with metal plates      Current Outpatient Rx  Name  Route  Sig  Dispense  Refill  . acetaminophen-codeine (TYLENOL #3) 300-30 MG per tablet   Oral   Take 1-2 tablets by mouth every 6 (six) hours as needed for moderate pain.   6 tablet   0   . albuterol (PROVENTIL HFA;VENTOLIN HFA) 108 (90 BASE) MCG/ACT inhaler   Inhalation   Inhale 2 puffs into the lungs every 6 (six) hours as needed for  wheezing or shortness of breath.   1 Inhaler   2   . amoxicillin (AMOXIL) 500 MG capsule   Oral   Take 1 capsule (500 mg total) by mouth 3 (three) times daily.   21 capsule   0   . benzonatate (TESSALON PERLES) 100 MG capsule   Oral   Take 1 capsule (100 mg total) by mouth 3 (three) times daily as needed for cough.   30 capsule   0   . ibuprofen (ADVIL,MOTRIN) 800 MG tablet   Oral   Take 1 tablet (800 mg total) by mouth 3 (three) times daily.   21 tablet   0     Allergies Review of patient's allergies indicates no known allergies.  No family history on file.  Social History Social History  Substance Use Topics  . Smoking status: Current Every Day Smoker  . Smokeless tobacco: None  . Alcohol Use: No     Comment: occ    Review of Systems Constitutional: No fever/chills Eyes: No visual changes. ENT: No sore throat. No nasal congestion.  Cardiovascular: Positive for chest pain worsened with cough. Respiratory: Positive for shortness of breath. Gastrointestinal: No abdominal pain.  Positive for acid reflux with occasional nausea and vomiting.  No diarrhea.  No constipation. Genitourinary: Occasionally feels a pain near his testicles for the last 10 years that occasionally causes dysuria. He wants to see a urologist. Pain  is not currently feeling pain or dysuria. Negative for hematuria.  Musculoskeletal: Negative for back pain. Skin: Negative for rash. Neurological: Negative for headaches, focal weakness or numbness. Allergic/Immunilogical: no known allergies  10-point ROS otherwise negative.  ____________________________________________   PHYSICAL EXAM:  VITAL SIGNS: ED Triage Vitals  Enc Vitals Group     BP 05/21/15 0815 131/74 mmHg     Pulse Rate 05/21/15 0815 84     Resp 05/21/15 0815 20     Temp 05/21/15 0815 98.5 F (36.9 C)     Temp Source 05/21/15 0815 Oral     SpO2 05/21/15 0815 96 %     Weight 05/21/15 0814 178 lb (80.74 kg)     Height 05/21/15  0814  (1.778 m)     Head Cir --      Peak Flow --      Pain Score 05/21/15 0814 8     Pain Loc --      Pain Edu? --      Excl. in GC? --     Constitutional: Alert and oriented. Well appearing and in no acute distress. Eyes: Conjunctivae are normal. PERRL. EOMI. Head: Atraumatic. Nose: No congestion/rhinnorhea. Mouth/Throat: Mucous membranes are moist.  Oropharynx non-erythematous. Neck: No stridor. Neck mildly tender to palpation   Hematological/Lymphatic/Immunilogical: No cervical lymphadenopathy. Cardiovascular: Normal rate, regular rhythm. Grossly normal heart sounds.  Good peripheral circulation. Chest pain reproducible on palpation of sternum and right chest. No swelling, bruising, or erythema noted to chest. No retractions.  Respiratory: Normal respiratory effort but states it is painful to take a deep breath.  Lungs CTAB.  Gastrointestinal: Soft and nontender. No distention. No abdominal bruits. No CVA tenderness. Musculoskeletal: No lower extremity tenderness nor edema.  No joint effusions. Neurologic:  Normal speech and language. No gross focal neurologic deficits are appreciated. No gait instability. Skin:  Skin is warm, dry and intact. No rash noted. Psychiatric: Mood and affect are normal. Speech and behavior are normal.  ____________________________________________   LABS (all labs ordered are listed, but only abnormal results are displayed)  Labs Reviewed  URINALYSIS COMPLETEWITH MICROSCOPIC ([facility] ONLY) - Abnormal; Notable for the following:    Color, Urine YELLOW (*)    APPearance CLEAR (*)    All other components within normal limits   ____________________________________________  EKG  EKG: vent rate 74 bpm, PR interval 150 ms. Normal axis. No STEMI.  ____________________________________________  RADIOLOGY  DG Chest 2 View: IMPRESSION: No active cardiopulmonary disease. ____________________________________________   PROCEDURES  Procedure(s)  performed: None  Critical Care performed: No  ____________________________________________   INITIAL IMPRESSION / ASSESSMENT AND PLAN / ED COURSE  Pertinent labs & imaging results that were available during my care of the patient were reviewed by me and considered in my medical decision making (see chart for details).  24 yo male presenting with cough, SOB, and chest pain. Patient with history of asthma and bronchitis. No recent antibiotics. Patient is afebrile in ED. Chest pain reproducible on exam. EKG normal with no evidence of STEMI. CXR negative for active cardiopulmonary disease. Symptoms most consistent with URI. DuoNeb's administered in ED for SOB, which improved symptoms. Rx Proventil 2 puffs q6h prn for SOB and wheezing. Rx Tessalon Perles prn for cough.   UA due to history of dysuria and wish for urology referral. UA is normal except for mucous present.   Patient voices no other emergency medical complaints at this time. Patient to follow up with PCP or return to ED  if symptoms continue or worsen. Work note given.  ____________________________________________   FINAL CLINICAL IMPRESSION(S) / ED DIAGNOSES  Final diagnoses:  URI (upper respiratory infection)     Evangeline Dakin, PA-C 05/21/15 1016  Sharyn Creamer, MD 05/21/15 [id number]

## 2015-05-29 ENCOUNTER — Encounter: Payer: Self-pay | Admitting: Emergency Medicine

## 2015-05-29 ENCOUNTER — Emergency Department
Admission: EM | Admit: 2015-05-29 | Discharge: 2015-05-30 | Discharge status: Against Medical Advice | Payer: 59 | Attending: Emergency Medicine | Admitting: Emergency Medicine

## 2015-05-29 DIAGNOSIS — Z720 Tobacco use: Secondary | ICD-10-CM | POA: Diagnosis not present

## 2015-05-29 DIAGNOSIS — R3911 Hesitancy of micturition: Secondary | ICD-10-CM | POA: Diagnosis not present

## 2015-05-29 DIAGNOSIS — N50811 Right testicular pain: Secondary | ICD-10-CM | POA: Diagnosis not present

## 2015-05-29 DIAGNOSIS — N50812 Left testicular pain: Secondary | ICD-10-CM | POA: Diagnosis present

## 2015-05-29 NOTE — ED Notes (Signed)
No answer when called from lobby 

## 2015-05-29 NOTE — ED Notes (Addendum)
Patient ambulatory to triage with steady gait, without difficulty or distress noted; pt reports teTamela OddiShaAla KMadaline GuthrZOX:WJKentuckyeri M1[place]61Bernerd LNorman Regional Health System 40-5Designer, fashion/cloParkway Surgery Center Dba ParkwJiGarne208GarTamela OddiShaAla [place]810-4098Catha GosNormaEHeJeralMadaline GuthrZOX:WJKentuckyeri M1[place]61Bernerd Lim289[facility] S27u8Designer, fashion/cloKaiser Fnd Hosp - San DiegotRJiGarnet(413)Indian SpringAdriana SimasATamela OddiShaAla (3[place]30) 4098CMadaline GuthrZOX:WJKentuckJiGarnet601KadokAdriana SimasTamela OddiShaAla ([place]646)4098Catha GosNormaERMadaline GuthrZOX:WJKentuckyeri M1[place]61Bernerd LimAmbulatory Surgery Cen74(9Designer, fashion/cloPresbyterian HospiJiGarnetChatta(802Los RanchoAdriana STamela OddiShaAla [place]209-4098CathMadaline GuthrZOX:WJKentuckyeri M1[place]61BernerdThedacare Medical Center63 3Designer, fashion/cloSanford University Of South Dakota MedicJiGarnetBent707DiehlstadTamela OddiShaAla ([place]217)4098Catha GosNoMadaline GuthrZOX:WJKentuckyeri M1[place]61Bernerd Lim7Fa[facility] Health Servic21e4Designer, fashion/cloEuclid JiGarn(910SpringvillAdriTamela OddiShaAla [place]361-4098Catha GosNormaEWewaJMadaline GuthrZOX:WJKentuckyeri M1[place]61Bernerd Lim8253Marion Ge56n3Designer, fashion/cloCitrus Valley Medical Center - QJiGarnetL770ArtoiTamela OddiShaAla (5KentuckMadaline GuthrZOX:WJKentuckyeri M1[place]61BernMaine Centers 57F7Designer, fashion/cloOrlando Outpatient Surgery CenterJiGarne(507)Santa SusaTamela OddiShaAla [place]579-4098Catha GosNormaEGreeMadaline GuthrZOX:WJKentuckyeri M1[place]61Bernerd Lim2Helena S44u9Designer, fashion/cloVaughan Regional Medical Center-Parkway CampustRhonaJiGarnetWest Wyo249ReynoldAdriana Tamela OddiShaAla (4KentuckMadalinJiGarn779CopalTamela OddiShaAla [place]213 4098Catha GosNormaEHoJeraMadaline GuthrZOX:WJKentuckyeri M1[place]61Bernerd LimMount Grant 34GeDesigner, fashion/cloBibb Medical CentertRJiGarnetRancho830Ashland HeiTamela OddiShaAla [place]574-Madaline GuthrZOX:WJKentuckyeri M1[place]61Bernerd Lim6Eye Surgery Center O58f6Designer, fashion/cloBuchananJiGarnetAre757Mackinac IslanAdriana SimasAlbTamela OddiShaAla Madaline GuthrZOX:WJKentuckyeri M1[place]61Bernerd Center For39 2Designer, fashion/cloNorwood EndoJiGaTamela OddiShaAla ([place]307Madaline GuthrZOX:WJKentuckyeri M1[place]61Bernerd Williamson 25S4Designer, fashion/cloNoble SurgerJiGarnetR248Sand RidgAdriana SimTamela OddiShaAla [place]479-4098CatMadaline GuthrZOX:WJKentuckyeri M1[place]61Bernerd Laurel He62i4Designer, fashion/cloUnion Surgery CeJiGarnet613Meadow WoTamela OddiShaAla KentMadaline GuthrZOX:WJKentuckyeri M1[place]61Bernerd Lim69C North Duke Unive67r9Designer, fashion/cloHumboldt County Memorial HospitaltRhona LeavensP(32KentuckyJeral Fr67-Madaline GuthrKentuckyZOXMarylanBernerd L45imDesigner, fashion/cloCigna Outpatient Surgery CentertRhona LeavensConservJiGarnetPurp8Tamela OddiShaAla KMadaline GuthrZOX:WJKentuckyeri M1[place]6Bernerd Lim2 SStephens C66o5Designer, fashion/cloSouth Central Surgical Center LLCtRhJiGarnet(203)CTamela OddiShaAla ([place]365)4098CatMadaline GuthrZOX:WJKentuckyeri M1[place]61BernNorth Shore Cataract And 55LaDesigner, fashion/cloCapitol City Surgery CentertRhona LJiGarnetFranklin(72Tamela OddiShaAla (6[place]0Madaline GuthrZOX:WJKentuckyeri M1[place]61Bernerd Penn Highla36n3Designer, fashion/clTamela 7ZOX:WJeri MJiGarnet574WoodlTamela OddiShaAlKentuckya 334Madaline GuthrZOX:WJKentuckyeri M1[place]6BernerAdventhea13l8Designer, fashion/cloNortheast Florida StateJiGarneTamela OddiShaAlKentucMadaline GuthrZOX:WJKentuckyeri M1[place]61Bernerd LiCare One At Humc 62P7Designer, fashion/cloSurgicare Surgical Associates Of RiJiGarnet(978WilliamsfielAdriana Tamela OddiShaAla [place]986-Madaline GuthrZOX:WJKentuckyeri M1[place]61BernerSt. Joseph'S Child33r3Designer, fashion/cloYoakum Community HospitaltRhoJiGarnet613Breckinridge CenteAdriana SimasAlbaTamela OddiShaAla ([place]725)4098CaMadaline GuthrZOX:WJKentuckyeri M1[place]61Bernerd [facility](9398Designer, fashion/cloWest Florida Medical CenterJiGarnetBunk(281WinterTamela OddiShaAla [place]985-4098Catha GMadaline GuthrZOX:WJKentuckyeri M1[place]61Bernerd LAssension Sacred Heart Hospital On80 4Designer, fashion/cloJacobson Memorial Hospital & Care CentertRhona LeavensPre459103 HalConservator, m(627Tamela O34[place]0-MJeral FruiSisters Of Charity 75-mCCaryl9Madaline GuthrKentuckyZOXMarylanBerne54rdDesigner, fashion/cloRummel Eye CaretRhona LeavensPr244 FosteConservator, mTed P7353e5JiGarnetBTamela OddiShaAla [place]503 4098CatMadaline GuthrZOX:WJKentuckyeri M1[place]61Bernerd LiBlueridge Vista Health16(2Designer, fashion/cloCrescent Medical Center LJiGarne(610)EllisTamela OddiShaAla ([place]405)4098Catha GosNormaENewingtonJMadaline GuthrZOX:WJKentuckyeri M1[place]61Bernerd LiBaptist M66(6Designer, fashion/cloIrvine Digestive Disease Center IncJiGarne770Tamela OddiShaAla [place]769-4098CatMadaline GuthrZOX:WJKentuckyeri M1[place]6Bernerd Lim4Westwood/[facility] S3y3Designer, fashion/cloDukes Memorial HospJiGarnetVan 269Bowmans ATamela OddiShaAla (2[place]28) 4098Catha GosNoMadaline GuthrZOX:WJKentuckyeri M1[place]61BernNorthern Arizona S1u3Designer, fashion/cloSacred Heart University DJiGarne986MenTamela OddiShaAla KentuckMadaline GuthrZOX:WJKentuckyeri M1[place]61Bernerd LiBeartooth B101i4Designer, fashion/cloSouth Meadows EndoscopyJiGarnetLa C(647AllTamela OddiShaAlKentuckya 23Madaline GuthrZOX:WJKentuckyeri M1[place]61Bernerd Lim8[facility]34 8Designer, fashion/cloEastwind SurgicalJiGarnetHun832CTamela OddiShaAlKenMadaline GuthrZOX:WJKentuckyeri M1[place]6BernerdSouthern California Hospi44taDesigner, fashion/cloJps Health Network - Trinity Springs NorthtRJiGarne6Tamela OddiShaAla [place]920-Madaline GuthrZOX:WJKentuckyeri M1[place]61Bernerd LChesterton Surg66e4Designer, fashion/cloVa Medical Center - Brockton JiGarnetGros(Tamela OddiShaAlKentuckya 914Madaline GuthrZOX:WJKentuckyeri M1[place]6Bernerd LiSurgical Center O64(3Designer, fashion/cloSt. Lukes Sugar Land HospitaltRhoJiGarnetPort A701Franklin ParAdrianaTamela OddiShaAlKentuckya 224098Catha GosNormaJeral FruiChandler EndoscoMadaline GuthrZOX:WJKentuckyeri M1[place]61Bernerd Bal34d3Designer, fashion/cloSouth Broward EnJiGarnetC540SkyTamela OddiShaAla [place]719 4098CMadaline GuthrZOX:WJKentuckyeri M1[place]61Bernerd Lim7Spart[facility] S40u2Designer, fashion/cloBraxton County Memorial HoJiGarnetBro864EtTamela OddiShaAla [place]646-4098Catha GosNorMadaline GuthrZOX:WJKentuckyeri M1[place]61BerneEastern Regional 66M3Designer, fashion/cloClinica SanJiGarnetBlad915Tamela OddiShaAlKentuckya 214098CMadaline GuthrZOX:WJKentuckyeri M1[place]61BerneFirst Hill Su49rgDesigner, fashion/cloSummit OaksJiGarnetJohnst325Livonia CTamela OddiShaAla KentuckyMadaline GuthrZOX:WJKentuckyeri M1[place]61BernerEssentia Healt57h Designer, fashion/cloFairlawn Rehabilitation HosJiGaTamela OddiShaAlKentuckyMadaline GuthrZOX:WJKentuckyeri M1[place]61Bernerd Lim964Swall Medic31a8Designer, fashion/cloNorth Valley Health CJiGarnetPhill407LakeriTamela OddiShaAla [place]502-Madaline GuthrZOX:WJKentuckyeri M1[place]61Bernerd LimE81x4Designer, fashion/cloLane Regional Medical JiGarnet65Tamela OddiShaAlKentuckya 644098Catha GosNormaEFroJeral FrMadaline GuthrZOX:WJKentuckyeri M1[place]61Bernerd Lim8Coffey C64o2Designer, fashion/cloAffinity Medical CentJiGarnetCr570Tamela OddiShaAla (9[place]25) 4098CMadaline GuthrZOX:WJKentuckyeri M1[place]61Bernerd Uchealth Gr63e7Designer, fashion/cloTlc Asc LLC Dba Tlc Outpatient Surgery And Laser CJiGarnet864Tamela OddiShaAla [place]279-4098Catha Madaline GuthrZOX:WJKentuckyeri M1[place]61Bernerd LCastle Rock S64u9Designer, fashion/cloMadelia Community HospitJiGarnetNu309SpringfielAdriTamela OddiShaAla [place]586-4098Catha GosNormaEStMadaline GuthrZOX:WJKentuckyeri M1[place]61BSt. Jude M66(4Designer, fashion/cloPage Memorial HospJiGarnet562GreenvillAdriaTamela OddiShaAla [place]206-4Madaline GuthrZOX:WJKentuckyeri M1[place]61Bernerd Lim58851P8Designer, fashion/cloVa Medical Center - DurhJiGaTamela OddiShaAlKentuckya 254098Catha GosNormaMadaline GuthrZOX:WJKentuckyeri M1[place]61Bernerd LIndiana University Health Tipto38n3Designer, fashion/cloSutter Health Palo Alto Medical FoundJiGarnetDoctor P(2Tamela OddiShaAlaMadaline GuthrZOX:WJKentuckyeri M1[place]61BernerMemorial Hermann Surgery Center Texas M14(7Designer, fashion/cloGreenville Community HJiGarnetBlack M647EdgarAdriana SimasAlbanLayGLaural BAwTamela OddiShaAla KeMadaline GuthrZOX:WJKentuckyeri M1[place]61Bernerd LiMethodist Mansfield M36(8Designer, fashion/cloIntegris Baptist Medical CenterJiGarne7Tamela OddiShaAla [place]320-40Madaline GuthrZOX:WJKentuckyeri M1[place]61Bernerd Memorial90 3Designer, fashion/cloEastside Endoscopy Center PLLCtRJiGarnetPa80Tamela OddiShaAla ([place]765)40Madaline GuthrZOX:WJKentuckyeri M1[place]61Bernerd Lim94Cincinnati Chil74d2Designer, fashion/cloMemorial Hermann Surgery Center RichmondJiGarnet2SocTamela OddiShaAla (5[place]01) 4098Catha GosNoMadaline GuthrZOX:WJKentuckyeri M1[place]61BerneLongs (2440Designer, fashion/cloMarion Hospital Corporation Heartland Regional MedicaJiGarnetW206WoodstocAdriana Tamela OddiShaAla [place]262 4098Catha GosNormaECeMadaline GuthrZOX:WJKentuckyeri M1[place]3761Designer, fashion/cloNorthern California Surgery Center LPtRJiGarn938ETamela OddiShaAla Kentucky9Madaline GuthrZOX:WJKentuckyeri M1[place]61BernerShenandoah Mem26o3Designer, fashion/cloNorthwest Specialty HospitaltRhonJiGarnetWest585ShoemTamela OddiShaAla [place]514-4098Catha GoMadaline GuthrZOX:WJKentuckyeri M1[place]61Bernerd LV[facility] 47S6Designer, fashion/cloD. W. Mcmillan Memorial HospitalJiGarnetHugh(Tamela OddiShaAla [place]715-4098Catha GosNormaECentreJeralMadaline GuthrZOX:WJKentuckyeri M1[place]61BernerdMid Hudson Forensic Psyc53h9Designer, fashion/cloSurgical Park Center LtdtRhona LeavensPre977347 SuConservator322mH.25wD(4Adriana SimasAlLaural BJeral Fr19-Madaline GuKentuckythBernerd L38Holy Cross Germantown HospitalRhona LeavensConservJiGarnet972SisterAdrianaTamela OddiShaAla (Madaline GuthrZOX:WJKentuckyeri M1[place]61Bernerd Lim8Pa27r8Designer, fashion/cloSouth Florida Baptist HoJiGarnetNor662Port AlleAdrianaTamela OddiShaAla KentuckyMadaline GuthrZOX:WJKentuckyeri M1[place]61Bernerd LValley Memorial Hospit38a6Designer, fashion/cloGalloway Surgery CeJiGarnetEl947DerbAdTamela OddiShaAla [place]425-40Madaline GuthrZOX:WJKentuckyeri M1[place]61Bernerd Lim9Nort70h2Designer, fashion/cloEyehealth Eastside Surgery Center LLCtRJiGarn731JericTamela OddiShaAla [place]906-4098Catha GosNormaEJeral FruiCentral VirginiaMadaline GuthrZOX:WJKentuckyeri M1[place]61Bernerd [facility] [place] Surg57e8Designer, fashion/cloUniversity Hospital And Medical CentertRhona LeavensPre207823 MeConservator,Tamela O(564[place]) EsJeral FruiDallas County Medica35-mKodiak SCaryl(43Madaline GuthrKentuckyZOXMarylan57BeDesigner, fashion/cloEgnm LLC Dba Lewes Surgery CentertRhona LeavensPr390 AnnadalConservator, mTed P3959e4JiGarnetKean Uni(402)Lake SenTamela OddiShaAla (5[place]87) 40Madaline GuthrZOX:WJKentuckyeri M1[place]61Bernerd 93(6Designer, fashion/cloCommon Wealth Endoscopy CentertRhoJiGar615AlpinTamela OddiShaAla ([place]641)4098CatMadaline GuthrZOX:WJKentuckyeri M1[place]6BerneIncline Village24 5Designer, fashion/cloSt. Lukes Des Peres HospitaJiGarnetCrystal 442SunmaAdriaTamela OddiShaAla [place]641-4098Catha GosNormaEGulf Park Jeral FruMadaline GuthrZOX:WJKentuckyeri M1[place]61Bernerd LimPasadena Advanced Sur71g6Designer, fashion/cloVa Amarillo HealJiGarnetMi(832TrentoAdriana SimasAlbanLayGLaural BAwTamela OddiShaAla KentuckMadaline GuthrZOX:WJKentuckyeri M1[place]61Bernerd Lim7258 JKingwood 3P6Designer, fashion/cloOwensboro HealthtRhona LeavensPre917579 South RConservato(724)010-9357ear25letEHaynR70ena4579-543-20385862028821Gus Puman13HazleKor 51w5d35 ppened again"; pt st pain increases with coughing and concerned that he has a hernia

## 2015-05-30 ENCOUNTER — Telehealth: Payer: Self-pay | Admitting: Emergency Medicine

## 2015-05-30 NOTE — ED Notes (Signed)
Called patient due to lwot to inquire about condition and follow up plans. Left message with my number. 

## 2015-05-30 NOTE — ED Notes (Signed)
No answer when called from lobby 

## 2015-06-05 ENCOUNTER — Encounter: Payer: Self-pay | Admitting: Emergency Medicine

## 2015-06-05 ENCOUNTER — Emergency Department: Payer: 59

## 2015-06-05 ENCOUNTER — Emergency Department
Admission: EM | Admit: 2015-06-05 | Discharge: 2015-06-05 | Disposition: A | Discharge status: Home / Self Care | Payer: 59 | Attending: Emergency Medicine | Admitting: Emergency Medicine

## 2015-06-05 DIAGNOSIS — Z791 Long term (current) use of non-steroidal anti-inflammatories (NSAID): Secondary | ICD-10-CM | POA: Diagnosis not present

## 2015-06-05 DIAGNOSIS — R05 Cough: Secondary | ICD-10-CM | POA: Diagnosis present

## 2015-06-05 DIAGNOSIS — Z792 Long term (current) use of antibiotics: Secondary | ICD-10-CM | POA: Insufficient documentation

## 2015-06-05 DIAGNOSIS — Z720 Tobacco use: Secondary | ICD-10-CM | POA: Insufficient documentation

## 2015-06-05 DIAGNOSIS — Z79899 Other long term (current) drug therapy: Secondary | ICD-10-CM | POA: Insufficient documentation

## 2015-06-05 DIAGNOSIS — J45901 Unspecified asthma with (acute) exacerbation: Secondary | ICD-10-CM | POA: Insufficient documentation

## 2015-06-05 DIAGNOSIS — J209 Acute bronchitis, unspecified: Secondary | ICD-10-CM

## 2015-06-05 MED ORDER — ALBUTEROL SULFATE HFA 108 (90 BASE) MCG/ACT IN AERS: 2.0000 | INHALATION_SPRAY | RESPIRATORY_TRACT | Status: DC | PRN

## 2015-06-05 MED ORDER — PREDNISONE 10 MG PO TABS: 10.0000 mg | ORAL_TABLET | ORAL | Status: DC

## 2015-06-05 MED ORDER — IPRATROPIUM-ALBUTEROL 0.5-2.5 (3) MG/3ML IN SOLN
3.0000 mL | Freq: Once | RESPIRATORY_TRACT | Status: AC
  Administered 2015-06-05: 3 mL via RESPIRATORY_TRACT
  Filled 2015-06-05: qty 3

## 2015-06-05 MED ORDER — AZITHROMYCIN 250 MG PO TABS: ORAL_TABLET | ORAL | Status: DC

## 2015-06-05 NOTE — ED Provider Notes (Signed)
[facility] Emergency Department Provider Note  ____________________________________________  Time seen: Approximately 12:53 PM  I have reviewed the triage vital signs and the nursing notes.   HISTORY  Chief Complaint Cough    HPI Joshua [name] is a 24 y.o. male presents to the emergency department complaining of shortness of breath, cough, nasal congestion, and "just not feeling good." He states that he was seen in the emergency department roughly 2 weeks ago for same symptoms. He states that he has not improved since. He said he has a history of bronchitis and that "it feels the same way as it has in the past." He is taking some allergy medication but denies any improvement with same.He states that he has back pain with coughing that is moderate to severe. He denies headache, neck pain, nausea or vomiting, abdominal pain.   Past Medical History  Diagnosis Date  . Bronchitis   . Asthma     There are no active problems to display for this patient.   Past Surgical History  Procedure Laterality Date  . Ankle/foot surgery with metal plates      Current Outpatient Rx  Name  Route  Sig  Dispense  Refill  . acetaminophen-codeine (TYLENOL #3) 300-30 MG per tablet   Oral   Take 1-2 tablets by mouth every 6 (six) hours as needed for moderate pain.   6 tablet   0   . albuterol (PROVENTIL HFA;VENTOLIN HFA) 108 (90 BASE) MCG/ACT inhaler   Inhalation   Inhale 2 puffs into the lungs every 6 (six) hours as needed for wheezing or shortness of breath.   1 Inhaler   2   . albuterol (PROVENTIL HFA;VENTOLIN HFA) 108 (90 BASE) MCG/ACT inhaler   Inhalation   Inhale 2 puffs into the lungs every 4 (four) hours as needed for wheezing or shortness of breath.   1 Inhaler   0   . amoxicillin (AMOXIL) 500 MG capsule   Oral   Take 1 capsule (500 mg total) by mouth 3 (three) times daily.   21 capsule   0   . azithromycin (ZITHROMAX Z-PAK) 250 MG tablet      Take  two pills on day 1, one pill a day for days 2-5.   6 each   0   . benzonatate (TESSALON PERLES) 100 MG capsule   Oral   Take 1 capsule (100 mg total) by mouth 3 (three) times daily as needed for cough.   30 capsule   0   . ibuprofen (ADVIL,MOTRIN) 800 MG tablet   Oral   Take 1 tablet (800 mg total) by mouth 3 (three) times daily.   21 tablet   0   . predniSONE (DELTASONE) 10 MG tablet   Oral   Take 1 tablet (10 mg total) by mouth as directed.   21 tablet   0     Take on a daily basis of 6, 5, 4, 3, 2, 1     Allergies Review of patient's allergies indicates no known allergies.  History reviewed. No pertinent family history.  Social History Social History  Substance Use Topics  . Smoking status: Current Every Day Smoker  . Smokeless tobacco: None  . Alcohol Use: No     Comment: occ    Review of Systems Constitutional: Endorses low-grade fever/chills Eyes: No visual changes. ENT: No sore throat. Cardiovascular: Denies chest pain. Respiratory: Endorses shortness of breath and cough. Gastrointestinal: No abdominal pain.  No nausea, no  vomiting.  No diarrhea.  No constipation. Genitourinary: Negative for dysuria. Musculoskeletal: Negative for back pain. Skin: Negative for rash. Neurological: Negative for headaches, focal weakness or numbness.  10-point ROS otherwise negative.  ____________________________________________   PHYSICAL EXAM:  VITAL SIGNS: ED Triage Vitals  Enc Vitals Group     BP 06/05/15 1227 138/85 mmHg     Pulse Rate 06/05/15 1227 94     Resp 06/05/15 1227 18     Temp 06/05/15 1227 98 F (36.7 C)     Temp Source 06/05/15 1227 Oral     SpO2 06/05/15 1227 98 %     Weight 06/05/15 1227 165 lb (74.844 kg)     Height 06/05/15 1227  (1.803 m)     Head Cir --      Peak Flow --      Pain Score 06/05/15 1228 4     Pain Loc --      Pain Edu? --      Excl. in GC? --     Constitutional: Alert and oriented. Well appearing and in no  acute distress. Eyes: Conjunctivae are normal. PERRL. EOMI. Head: Atraumatic. Nose: Mild purulent congestion/rhinnorhea. Mouth/Throat: Mucous membranes are moist.  Oropharynx non-erythematous. Neck: No stridor.   Hematological/Lymphatic/Immunilogical: No cervical lymphadenopathy. Cardiovascular: Normal rate, regular rhythm. Grossly normal heart sounds.  Good peripheral circulation. Respiratory: Normal respiratory effort.  No retractions. Lungs with coarse breath sounds bilaterally. Moderate expiratory wheezing auscultated. Gastrointestinal: Soft and nontender. No distention. No abdominal bruits. No CVA tenderness. Musculoskeletal: No lower extremity tenderness nor edema.  No joint effusions. Neurologic:  Normal speech and language. No gross focal neurologic deficits are appreciated. No gait instability. Skin:  Skin is warm, dry and intact. No rash noted. Psychiatric: Mood and affect are normal. Speech and behavior are normal.  ____________________________________________   LABS (all labs ordered are listed, but only abnormal results are displayed)  Labs Reviewed - No data to display ____________________________________________  EKG   ____________________________________________  RADIOLOGY  Two-view chest x-ray Impression: No active cardiopulmonary disease. ____________________________________________   PROCEDURES  Procedure(s) performed: None  Critical Care performed: No  ____________________________________________   INITIAL IMPRESSION / ASSESSMENT AND PLAN / ED COURSE  Pertinent labs & imaging results that were available during my care of the patient were reviewed by me and considered in my medical decision making (see chart for details).  The patient's history, symptoms, and physical exam are consistent with a diagnosis of bronchitis. Advised patient of findings and diagnosis and he understands and verbalizes agreement with same. Advised the patient noted place him  on antibiotics, prednisone taper, albuterol inhaler 4 symptomatic relief. Advised patient to continue over-the-counter allergy medications. The patient verbalizes understanding and compliance with treatment plan.  ____________________________________________   FINAL CLINICAL IMPRESSION(S) / ED DIAGNOSES  Final diagnoses:  Acute bronchitis, unspecified organism      Racheal Patches, PA-C 06/05/15 1330  Sharman Cheek, MD 06/06/15 [phone]

## 2015-06-05 NOTE — ED Notes (Signed)
Pt presents with cough for a week and fever of 101 .

## 2015-06-05 NOTE — Discharge Instructions (Signed)

## 2015-09-02 ENCOUNTER — Emergency Department: Payer: 59

## 2015-09-02 ENCOUNTER — Emergency Department
Admission: EM | Admit: 2015-09-02 | Discharge: 2015-09-02 | Disposition: A | Discharge status: Home / Self Care | Payer: 59 | Attending: Emergency Medicine | Admitting: Emergency Medicine

## 2015-09-02 ENCOUNTER — Encounter: Payer: Self-pay | Admitting: Emergency Medicine

## 2015-09-02 DIAGNOSIS — Y93H1 Activity, digging, shoveling and raking: Secondary | ICD-10-CM | POA: Diagnosis not present

## 2015-09-02 DIAGNOSIS — F17200 Nicotine dependence, unspecified, uncomplicated: Secondary | ICD-10-CM | POA: Diagnosis not present

## 2015-09-02 DIAGNOSIS — S300XXA Contusion of lower back and pelvis, initial encounter: Secondary | ICD-10-CM | POA: Diagnosis not present

## 2015-09-02 DIAGNOSIS — W000XXA Fall on same level due to ice and snow, initial encounter: Secondary | ICD-10-CM | POA: Diagnosis not present

## 2015-09-02 DIAGNOSIS — S3992XA Unspecified injury of lower back, initial encounter: Secondary | ICD-10-CM | POA: Diagnosis present

## 2015-09-02 DIAGNOSIS — Y9289 Other specified places as the place of occurrence of the external cause: Secondary | ICD-10-CM | POA: Insufficient documentation

## 2015-09-02 DIAGNOSIS — Y998 Other external cause status: Secondary | ICD-10-CM | POA: Insufficient documentation

## 2015-09-02 MED ORDER — HYDROCODONE-ACETAMINOPHEN 5-325 MG PO TABS
2.0000 | ORAL_TABLET | Freq: Once | ORAL | Status: AC
  Administered 2015-09-02: 2 via ORAL
  Filled 2015-09-02: qty 2

## 2015-09-02 MED ORDER — IBUPROFEN 800 MG PO TABS: 800.0000 mg | ORAL_TABLET | Freq: Three times a day (TID) | ORAL | Status: DC | PRN

## 2015-09-02 MED ORDER — KETOROLAC TROMETHAMINE 60 MG/2ML IM SOLN
60.0000 mg | Freq: Once | INTRAMUSCULAR | Status: AC
  Administered 2015-09-02: 60 mg via INTRAMUSCULAR
  Filled 2015-09-02: qty 2

## 2015-09-02 MED ORDER — CYCLOBENZAPRINE HCL 10 MG PO TABS: 10.0000 mg | ORAL_TABLET | Freq: Three times a day (TID) | ORAL | Status: DC | PRN

## 2015-09-02 MED ORDER — HYDROCODONE-ACETAMINOPHEN 5-325 MG PO TABS: 1.0000 | ORAL_TABLET | ORAL | Status: DC | PRN

## 2015-09-02 NOTE — ED Notes (Signed)
NAD noted at time of D/C. Pt denies questions or concerns. Pt ambulatory to the lobby at this time. Pt refused wheelchair to the lobby.  

## 2015-09-02 NOTE — ED Provider Notes (Signed)
[facility] Emergency Department Provider Note  ____________________________________________  Time seen: Approximately 11:09 AM  I have reviewed the triage vital signs and the nursing notes.   HISTORY  Chief Complaint Fall    HPI Joshua [name] is a 25 y.o. male presents for evaluation of lower back pain. Patient states he slipped while shoveling snow yesterday landing on his lower back.   Past Medical History  Diagnosis Date  . Bronchitis   . Asthma     There are no active problems to display for this patient.   Past Surgical History  Procedure Laterality Date  . Ankle/foot surgery with metal plates      Current Outpatient Rx  Name  Route  Sig  Dispense  Refill  . albuterol (PROVENTIL HFA;VENTOLIN HFA) 108 (90 BASE) MCG/ACT inhaler   Inhalation   Inhale 2 puffs into the lungs every 6 (six) hours as needed for wheezing or shortness of breath.   1 Inhaler   2   . albuterol (PROVENTIL HFA;VENTOLIN HFA) 108 (90 BASE) MCG/ACT inhaler   Inhalation   Inhale 2 puffs into the lungs every 4 (four) hours as needed for wheezing or shortness of breath.   1 Inhaler   0   . cyclobenzaprine (FLEXERIL) 10 MG tablet   Oral   Take 1 tablet (10 mg total) by mouth every 8 (eight) hours as needed for muscle spasms.   30 tablet   1   . HYDROcodone-acetaminophen (NORCO) 5-325 MG tablet   Oral   Take 1-2 tablets by mouth every 4 (four) hours as needed for moderate pain.   15 tablet   0   . ibuprofen (ADVIL,MOTRIN) 800 MG tablet   Oral   Take 1 tablet (800 mg total) by mouth every 8 (eight) hours as needed.   30 tablet   0     Allergies Review of patient's allergies indicates no known allergies.  History reviewed. No pertinent family history.  Social History Social History  Substance Use Topics  . Smoking status: Current Every Day Smoker  . Smokeless tobacco: None  . Alcohol Use: No     Comment: occ    Review of Systems Constitutional: No  fever/chills Eyes: No visual changes. ENT: No sore throat. Cardiovascular: Denies chest pain. Respiratory: Denies shortness of breath. Gastrointestinal: No abdominal pain.  No nausea, no vomiting.  No diarrhea.  No constipation. Genitourinary: Negative for dysuria. Musculoskeletal: Positive for lower back pain. Skin: Negative for rash. Neurological: Negative for headaches, focal weakness or numbness.  10-point ROS otherwise negative.  ____________________________________________   PHYSICAL EXAM:  VITAL SIGNS: ED Triage Vitals  Enc Vitals Group     BP 09/02/15 1054 143/75 mmHg     Pulse Rate 09/02/15 1054 90     Resp 09/02/15 1054 18     Temp 09/02/15 1054 97.8 F (36.6 C)     Temp Source 09/02/15 1054 Oral     SpO2 09/02/15 1054 99 %     Weight 09/02/15 1054 170 lb (77.111 kg)     Height 09/02/15 1054 5\' 10"  (1.778 m)     Head Cir --      Peak Flow --      Pain Score 09/02/15 1057 9     Pain Loc --      Pain Edu? --      Excl. in GC? --     Constitutional: Alert and oriented. Well appearing and in no acute distress. Cardiovascular: Normal rate,  regular rhythm. Grossly normal heart sounds.  Good peripheral circulation. Respiratory: Normal respiratory effort.  No retractions. Lungs CTAB. Gastrointestinal: Soft and nontender. No distention. No abdominal bruits. No CVA tenderness. Musculoskeletal: No lower extremity tenderness nor edema.  No joint effusions. Trachea leg raise positive bilaterally at approximately 40 distally neurovascularly intact. No ecchymosis or bruising noted around the lumbar spine. Point tenderness to the lower lumbar and paraspinal areas. Neurologic:  Normal speech and language. No gross focal neurologic deficits are appreciated. No gait instability. Skin:  Skin is warm, dry and intact. No rash noted. Psychiatric: Mood and affect are normal. Speech and behavior are normal.  ____________________________________________   LABS (all labs ordered are  listed, but only abnormal results are displayed)  Labs Reviewed - No data to display ____________________________________________   RADIOLOGY  Negative for any acute osseous findings. ____________________________________________   PROCEDURES  Procedure(s) performed: None  Critical Care performed: No  ____________________________________________   INITIAL IMPRESSION / ASSESSMENT AND PLAN / ED COURSE  Pertinent labs & imaging results that were available during my care of the patient were reviewed by me and considered in my medical decision making (see chart for details).  Status post fall with acute lumbar contusion. Toradol 60 mg IM and Vicodin 5/325 given while in the ED. Patient reports some symptomatic relief. Discharge home with a prescription for Flexeril 10 mg, Naprosyn 500 mg, and Vicodin 5/325. Patient to follow up with PCP or return to the ER with any worsening symptomology. Patient voices no other emergency medical complaints at this time. ____________________________________________   FINAL CLINICAL IMPRESSION(S) / ED DIAGNOSES  Final diagnoses:  Lumbar contusion, initial encounter      Evangeline Dakin, PA-C 09/02/15 1157  Rockne Menghini, MD 09/02/15 [phone]

## 2015-09-02 NOTE — ED Notes (Signed)
Reports slipped while shoveling snow yesterday, lower back pain.  Ambulates well.

## 2015-09-02 NOTE — Discharge Instructions (Signed)
Tailbone Injury °The tailbone (coccyx) is the small bone at the lower end of the spine. A tailbone injury may involve stretched ligaments, bruising, or a broken bone (fracture). Tailbone injuries can be painful, and some may take a long time to heal. °CAUSES °This condition is often caused by falling and landing on the tailbone. Other causes include: °· Repeated strain or friction from actions such as rowing and bicycling. °· Childbirth. °In some cases, the cause may not be known. °RISK FACTORS °This condition is more common in women than in men. °SYMPTOMS °Symptoms of this condition include: °· Pain in the lower back, especially when sitting. °· Pain or difficulty when standing up from a sitting position. °· Bruising in the tailbone area. °· Painful bowel movements. °· In women, pain during intercourse. °DIAGNOSIS °This condition may be diagnosed based on your symptoms and a physical exam. X-rays may be taken if a fracture is suspected. You may also have other tests, such as a CT scan or MRI. °TREATMENT °This condition may be treated with medicines to help relieve your pain. Most tailbone injuries heal on their own in 4-6 weeks. However, recovery time may be longer if the injury involves a fracture. °HOME CARE INSTRUCTIONS °· Take medicines only as directed by your health care provider. °· If directed, apply ice to the injured area: °¨ Put ice in a plastic bag. °¨ Place a towel between your skin and the bag. °¨ Leave the ice on for 20 minutes, 2-3 times per day for the first 1-2 days. °· Sit on a large, rubber or inflated ring or cushion to ease your pain. Lean forward when you are sitting to help decrease discomfort. °· Avoid sitting for long periods of time. °· Increase your activity as the pain allows. Perform any exercises that are recommended by your health care provider or physical therapist. °· If you have pain during bowel movements, use stool softeners as directed by your health care provider. °· Eat a  diet that includes plenty of fiber to help prevent constipation. °· Keep all follow-up visits as directed by your health care provider. This is important. °PREVENTION °Wear appropriate padding and sports gear when bicycling and rowing. This can help to prevent developing an injury that is caused by repeated strain or friction. °SEEK MEDICAL CARE IF: °· Your pain becomes worse. °· Your bowel movements cause a great deal of discomfort. °· You are unable to have a bowel movement. °· You have uncontrolled urine loss (urinary incontinence). °· You have a fever. °  °This information is not intended to replace advice given to you by your health care provider. Make sure you discuss any questions you have with your health care provider. °  °Document Released: 08/08/2000 Document Revised: 12/26/2014 Document Reviewed: 08/07/2014 °Elsevier Interactive Patient Education ©2016 [employer] ° °

## 2015-10-01 ENCOUNTER — Encounter: Payer: Self-pay | Admitting: Emergency Medicine

## 2015-10-01 ENCOUNTER — Emergency Department: Payer: 59

## 2015-10-01 ENCOUNTER — Emergency Department
Admission: EM | Admit: 2015-10-01 | Discharge: 2015-10-01 | Disposition: A | Discharge status: Home / Self Care | Payer: 59 | Attending: Emergency Medicine | Admitting: Emergency Medicine

## 2015-10-01 DIAGNOSIS — W2209XA Striking against other stationary object, initial encounter: Secondary | ICD-10-CM | POA: Insufficient documentation

## 2015-10-01 DIAGNOSIS — Y998 Other external cause status: Secondary | ICD-10-CM | POA: Insufficient documentation

## 2015-10-01 DIAGNOSIS — Y9289 Other specified places as the place of occurrence of the external cause: Secondary | ICD-10-CM | POA: Diagnosis not present

## 2015-10-01 DIAGNOSIS — F17210 Nicotine dependence, cigarettes, uncomplicated: Secondary | ICD-10-CM | POA: Diagnosis not present

## 2015-10-01 DIAGNOSIS — L089 Local infection of the skin and subcutaneous tissue, unspecified: Secondary | ICD-10-CM | POA: Insufficient documentation

## 2015-10-01 DIAGNOSIS — Y9389 Activity, other specified: Secondary | ICD-10-CM | POA: Diagnosis not present

## 2015-10-01 DIAGNOSIS — S61032A Puncture wound without foreign body of left thumb without damage to nail, initial encounter: Secondary | ICD-10-CM

## 2015-10-01 DIAGNOSIS — Z23 Encounter for immunization: Secondary | ICD-10-CM | POA: Diagnosis not present

## 2015-10-01 DIAGNOSIS — S6992XA Unspecified injury of left wrist, hand and finger(s), initial encounter: Secondary | ICD-10-CM | POA: Diagnosis present

## 2015-10-01 DIAGNOSIS — T798XXA Other early complications of trauma, initial encounter: Secondary | ICD-10-CM

## 2015-10-01 MED ORDER — CEPHALEXIN 500 MG PO CAPS: 500.0000 mg | ORAL_CAPSULE | Freq: Four times a day (QID) | ORAL | Status: DC

## 2015-10-01 MED ORDER — IBUPROFEN 600 MG PO TABS
600.0000 mg | ORAL_TABLET | Freq: Once | ORAL | Status: AC
  Administered 2015-10-01: 600 mg via ORAL
  Filled 2015-10-01: qty 1

## 2015-10-01 MED ORDER — TETANUS-DIPHTH-ACELL PERTUSSIS 5-2.5-18.5 LF-MCG/0.5 IM SUSP
0.5000 mL | Freq: Once | INTRAMUSCULAR | Status: AC
  Administered 2015-10-01: 0.5 mL via INTRAMUSCULAR
  Filled 2015-10-01: qty 0.5

## 2015-10-01 MED ORDER — IBUPROFEN 800 MG PO TABS: 800.0000 mg | ORAL_TABLET | Freq: Three times a day (TID) | ORAL | Status: DC

## 2015-10-01 NOTE — ED Provider Notes (Signed)
[facility] Emergency Department Provider Note  ____________________________________________  Time seen: Approximately 8:16 AM  I have reviewed the triage vital signs and the nursing notes.   HISTORY  Chief Complaint Foreign Body   HPI Joshua [name] is a 25 y.o. male dysuria complaint of possible foreign body to his left thumb. Patient states that he thinks he possibly got a piece of cast iron stomach and his thumb. He noted a little bit of soreness and a small puncture wound. He states his wife tried digging it out with a sewing needle that is unaware if anything came out. He is also not had a tetanus shot and "long time". Today his thumb is sore, tender to touch and slightly red. He denies any drainage from the puncture wound. At this time he rates his pain 6/10.   Past Medical History  Diagnosis Date  . Bronchitis   . Asthma     There are no active problems to display for this patient.   Past Surgical History  Procedure Laterality Date  . Ankle/foot surgery with metal plates      Current Outpatient Rx  Name  Route  Sig  Dispense  Refill  . albuterol (PROVENTIL HFA;VENTOLIN HFA) 108 (90 BASE) MCG/ACT inhaler   Inhalation   Inhale 2 puffs into the lungs every 6 (six) hours as needed for wheezing or shortness of breath.   1 Inhaler   2   . albuterol (PROVENTIL HFA;VENTOLIN HFA) 108 (90 BASE) MCG/ACT inhaler   Inhalation   Inhale 2 puffs into the lungs every 4 (four) hours as needed for wheezing or shortness of breath.   1 Inhaler   0   . cephALEXin (KEFLEX) 500 MG capsule   Oral   Take 1 capsule (500 mg total) by mouth 4 (four) times daily.   28 capsule   0   . cyclobenzaprine (FLEXERIL) 10 MG tablet   Oral   Take 1 tablet (10 mg total) by mouth every 8 (eight) hours as needed for muscle spasms.   30 tablet   1   . HYDROcodone-acetaminophen (NORCO) 5-325 MG tablet   Oral   Take 1-2 tablets by mouth every 4 (four) hours as needed for  moderate pain.   15 tablet   0   . ibuprofen (ADVIL,MOTRIN) 800 MG tablet   Oral   Take 1 tablet (800 mg total) by mouth 3 (three) times daily.   30 tablet   0     Allergies Review of patient's allergies indicates no known allergies.  No family history on file.  Social History Social History  Substance Use Topics  . Smoking status: Current Every Day Smoker -- 1.50 packs/day    Types: Cigarettes  . Smokeless tobacco: None  . Alcohol Use: No     Comment: occ    Review of Systems Constitutional: No fever/chills Cardiovascular: Denies chest pain. Respiratory: Denies shortness of breath. Gastrointestinal:   No nausea, no vomiting.  Musculoskeletal: Positive left thumb pain. Skin: Puncture wound left thumb Neurological: Negative for headaches, focal weakness or numbness.  10-point ROS otherwise negative.  ____________________________________________   PHYSICAL EXAM:  VITAL SIGNS: ED Triage Vitals  Enc Vitals Group     BP 10/01/15 0806 138/84 mmHg     Pulse Rate 10/01/15 0806 84     Resp 10/01/15 0806 18     Temp 10/01/15 0806 98.2 F (36.8 C)     Temp Source 10/01/15 0806 Oral  SpO2 10/01/15 0806 99 %     Weight 10/01/15 0806 170 lb (77.111 kg)     Height 10/01/15 0806  (1.778 m)     Head Cir --      Peak Flow --      Pain Score 10/01/15 0807 6     Pain Loc --      Pain Edu? --      Excl. in GC? --     Constitutional: Alert and oriented. Well appearing and in no acute distress. Eyes: Conjunctivae are normal. PERRL. EOMI. Head: Atraumatic. Nose: No congestion/rhinnorhea. Neck: No stridor.   Cardiovascular: Normal rate, regular rhythm. Grossly normal heart sounds.  Good peripheral circulation. Respiratory: Normal respiratory effort.  No retractions. Lungs CTAB. Gastrointestinal: Soft and nontender. No distention. Musculoskeletal: Moves upper and lower extremities without any difficulty. Left thumb flexion and extension is without any difficulty.  There is moderate tenderness on palpation of the soft tissue. Neurologic:  Normal speech and language. No gross focal neurologic deficits are appreciated. No gait instability. Skin:  Skin is warm, dry . There is one single puncture wound present at the distal tip without active drainage. There is some mild erythema present. Psychiatric: Mood and affect are normal. Speech and behavior are normal.  ____________________________________________   LABS (all labs ordered are listed, but only abnormal results are displayed)  Labs Reviewed - No data to display  RADIOLOGY  Left thumb no foreign body was noted. ____________________________________________   PROCEDURES  Procedure(s) performed: None  Critical Care performed: No  ____________________________________________   INITIAL IMPRESSION / ASSESSMENT AND PLAN / ED COURSE  Pertinent labs & imaging results that were available during my care of the patient were reviewed by me and considered in my medical decision making (see chart for details).  Patient was given a tetanus booster while in the emergency room. Patient was instructed to soak thumb frequently today and warm water. He was started on a prescription of Keflex 500 mg 4 times a day for 7 days and ibuprofen as needed for pain. He did not want to file this as workman's comp. ____________________________________________   FINAL CLINICAL IMPRESSION(S) / ED DIAGNOSES  Final diagnoses:  Puncture wound of thumb, left, initial encounter  Wound infection, initial encounter      Tommi Rumps, PA-C 10/01/15 4098  Myrna Blazer, MD 10/01/15 [phone]

## 2015-10-01 NOTE — Discharge Instructions (Signed)
Soak your hand frequently today approximately 15-20 minutes every hour. Take Keflex 4 times a day for 7 days. Ibuprofen as needed for pain. Follow-up with [facility] clinic or Dr. Joice Lofts if any continued problems.

## 2015-10-01 NOTE — ED Notes (Signed)
Pt states he got a small piece of cast iron stuck in his first digit on left hand, states he was on the job when it happened. Pt states his wife tried to dig it out with a sewing needle. Tip of thumb red.

## 2015-10-01 NOTE — ED Notes (Signed)
Pt states he does not want to file workers comp.

## 2015-10-01 NOTE — ED Notes (Signed)
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 Small punture noted to tip of thumb. Mild swelling noted. +PMS

## 2015-11-12 ENCOUNTER — Emergency Department
Admission: EM | Admit: 2015-11-12 | Discharge: 2015-11-12 | Disposition: A | Discharge status: Home / Self Care | Payer: Managed Care, Other (non HMO) | Attending: Emergency Medicine | Admitting: Emergency Medicine

## 2015-11-12 ENCOUNTER — Emergency Department: Payer: Managed Care, Other (non HMO)

## 2015-11-12 ENCOUNTER — Encounter: Payer: Self-pay | Admitting: Emergency Medicine

## 2015-11-12 DIAGNOSIS — T148XXA Other injury of unspecified body region, initial encounter: Secondary | ICD-10-CM

## 2015-11-12 DIAGNOSIS — S3992XA Unspecified injury of lower back, initial encounter: Secondary | ICD-10-CM | POA: Diagnosis present

## 2015-11-12 DIAGNOSIS — Y9289 Other specified places as the place of occurrence of the external cause: Secondary | ICD-10-CM | POA: Insufficient documentation

## 2015-11-12 DIAGNOSIS — F17210 Nicotine dependence, cigarettes, uncomplicated: Secondary | ICD-10-CM | POA: Insufficient documentation

## 2015-11-12 DIAGNOSIS — Z79899 Other long term (current) drug therapy: Secondary | ICD-10-CM | POA: Diagnosis not present

## 2015-11-12 DIAGNOSIS — X58XXXA Exposure to other specified factors, initial encounter: Secondary | ICD-10-CM | POA: Insufficient documentation

## 2015-11-12 DIAGNOSIS — Y998 Other external cause status: Secondary | ICD-10-CM | POA: Insufficient documentation

## 2015-11-12 DIAGNOSIS — Y9389 Activity, other specified: Secondary | ICD-10-CM | POA: Insufficient documentation

## 2015-11-12 DIAGNOSIS — S39012A Strain of muscle, fascia and tendon of lower back, initial encounter: Secondary | ICD-10-CM | POA: Diagnosis not present

## 2015-11-12 DIAGNOSIS — G8911 Acute pain due to trauma: Secondary | ICD-10-CM

## 2015-11-12 MED ORDER — MELOXICAM 15 MG PO TABS: 15.0000 mg | ORAL_TABLET | Freq: Every day | ORAL | Status: DC

## 2015-11-12 MED ORDER — CYCLOBENZAPRINE HCL 10 MG PO TABS: 10.0000 mg | ORAL_TABLET | Freq: Three times a day (TID) | ORAL | Status: DC | PRN

## 2015-11-12 MED ORDER — TRAMADOL HCL 50 MG PO TABS: 50.0000 mg | ORAL_TABLET | Freq: Four times a day (QID) | ORAL | Status: DC | PRN

## 2015-11-12 MED ORDER — CYCLOBENZAPRINE HCL 10 MG PO TABS
10.0000 mg | ORAL_TABLET | Freq: Once | ORAL | Status: AC
Start: 2015-11-12 — End: 2015-11-12
  Administered 2015-11-12: 10 mg via ORAL
  Filled 2015-11-12: qty 1

## 2015-11-12 NOTE — ED Notes (Signed)
Pt discharged home after verbalizing understanding of discharge instructions; nad noted. 

## 2015-11-12 NOTE — ED Notes (Signed)
States he developed lower back pain for 1-2 days.pain is non radiating  In lower back  Describes as sharp in intensity.  Developed pain this weekend after working on car

## 2015-11-12 NOTE — ED Provider Notes (Signed)
[facility] Emergency Department Provider Note ____________________________________________  Time seen: Approximately 4:50 PM  I have reviewed the triage vital signs and the nursing notes.   HISTORY  Chief Complaint Back Pain    HPI Joshua [name] is a 25 y.o. male presents to the emergency department for evaluation of lower back pain. He states that the pain started was 2 days ago after pulling the transmission out of his car. He states that the pain is sharp and burning in nature. He has not had any relief with ibuprofen and heating pad. Returning back pain in the past, but this is different. He states that he feels "like bones are rubbing together."  Past Medical History  Diagnosis Date  . Bronchitis   . Asthma     There are no active problems to display for this patient.   Past Surgical History  Procedure Laterality Date  . Ankle/foot surgery with metal plates      Current Outpatient Rx  Name  Route  Sig  Dispense  Refill  . albuterol (PROVENTIL HFA;VENTOLIN HFA) 108 (90 BASE) MCG/ACT inhaler   Inhalation   Inhale 2 puffs into the lungs every 6 (six) hours as needed for wheezing or shortness of breath.   1 Inhaler   2   . albuterol (PROVENTIL HFA;VENTOLIN HFA) 108 (90 BASE) MCG/ACT inhaler   Inhalation   Inhale 2 puffs into the lungs every 4 (four) hours as needed for wheezing or shortness of breath.   1 Inhaler   0   . cephALEXin (KEFLEX) 500 MG capsule   Oral   Take 1 capsule (500 mg total) by mouth 4 (four) times daily.   28 capsule   0   . cyclobenzaprine (FLEXERIL) 10 MG tablet   Oral   Take 1 tablet (10 mg total) by mouth every 8 (eight) hours as needed for muscle spasms.   30 tablet   1   . meloxicam (MOBIC) 15 MG tablet   Oral   Take 1 tablet (15 mg total) by mouth daily.   30 tablet   0   . traMADol (ULTRAM) 50 MG tablet   Oral   Take 1 tablet (50 mg total) by mouth every 6 (six) hours as needed.   12 tablet   0      Allergies Review of patient's allergies indicates no known allergies.  No family history on file.  Social History Social History  Substance Use Topics  . Smoking status: Current Every Day Smoker -- 1.50 packs/day    Types: Cigarettes  . Smokeless tobacco: None  . Alcohol Use: No     Comment: occ    Review of Systems Constitutional: No recent illness. Respiratory: Denies shortness of breath. Gastrointestinal: No abdominal pain.  Genitourinary: Negative for dysuria. Musculoskeletal: Pain in lower back. Skin: Negative for rash. Neurological: Negative for headaches, focal weakness or numbness.   ____________________________________________   PHYSICAL EXAM:  VITAL SIGNS: ED Triage Vitals  Enc Vitals Group     BP 11/12/15 1453 143/87 mmHg     Pulse Rate 11/12/15 1453 101     Resp 11/12/15 1453 20     Temp 11/12/15 1453 97.6 F (36.4 C)     Temp Source 11/12/15 1453 Oral     SpO2 11/12/15 1453 100 %     Weight 11/12/15 1453 170 lb (77.111 kg)     Height --      Head Cir --      Peak  Flow --      Pain Score 11/12/15 1454 6     Pain Loc --      Pain Edu? --      Excl. in GC? --     Constitutional: Alert and oriented. Well appearing and in no acute distress. Eyes: Conjunctivae are normal. EOMI. Head: Atraumatic. Nose: No congestion/rhinnorhea. Neck: No stridor.  Respiratory: Normal respiratory effort.   Musculoskeletal: Active range of motion of the lumbar spine observed. Neurologic:  Normal speech and language. No gross focal neurologic deficits are appreciated. Speech is normal. Observed ambulating without assistance or difficulty. Skin:  Skin is warm, dry and intact. Atraumatic. Psychiatric: Mood and affect are normal. Speech and behavior are normal.  ____________________________________________   LABS (all labs ordered are listed, but only abnormal results are displayed)  Labs Reviewed - No data to  display ____________________________________________  RADIOLOGY  Lumbar spine negative for acute abnormality per radiology. ____________________________________________   PROCEDURES  Procedure(s) performed: None   ____________________________________________   INITIAL IMPRESSION / ASSESSMENT AND PLAN / ED COURSE  Pertinent labs & imaging results that were available during my care of the patient were reviewed by me and considered in my medical decision making (see chart for details).  Patient was advised to follow-up with orthopedics for symptoms that are not improving over the week. He was advised to avoid lifting while he is experiencing pain. He was advised to return to the emergency department for symptoms that change or worsen if he is unable schedule an appointment. ____________________________________________   FINAL CLINICAL IMPRESSION(S) / ED DIAGNOSES  Final diagnoses:  Acute pain due to injury  Musculoskeletal strain       Chinita PesterCari B Aella Ronda, FNP 11/12/15 1853  Minna AntisKevin Paduchowski, MD 11/12/15 2009

## 2015-11-12 NOTE — ED Notes (Signed)
Pt to ed with c/o back pain x several years.  Pt reports pain is burning in lower back.  Denies trouble urinating.  Pt states " i feel like it is my disc"

## 2015-12-05 ENCOUNTER — Ambulatory Visit: Payer: Self-pay

## 2015-12-05 ENCOUNTER — Encounter: Payer: Managed Care, Other (non HMO) | Admitting: Podiatry

## 2015-12-05 DIAGNOSIS — M79671 Pain in right foot: Secondary | ICD-10-CM

## 2015-12-05 DIAGNOSIS — M79672 Pain in left foot: Principal | ICD-10-CM

## 2015-12-13 ENCOUNTER — Ambulatory Visit (INDEPENDENT_AMBULATORY_CARE_PROVIDER_SITE_OTHER): Payer: Managed Care, Other (non HMO)

## 2015-12-13 ENCOUNTER — Encounter: Payer: Self-pay | Admitting: Podiatry

## 2015-12-13 ENCOUNTER — Ambulatory Visit: Payer: Managed Care, Other (non HMO) | Admitting: Podiatry

## 2015-12-13 ENCOUNTER — Ambulatory Visit (INDEPENDENT_AMBULATORY_CARE_PROVIDER_SITE_OTHER): Payer: Managed Care, Other (non HMO) | Admitting: Podiatry

## 2015-12-13 VITALS — BP 143/89 | HR 95 | Resp 16

## 2015-12-13 DIAGNOSIS — M79672 Pain in left foot: Secondary | ICD-10-CM

## 2015-12-13 DIAGNOSIS — M779 Enthesopathy, unspecified: Secondary | ICD-10-CM | POA: Diagnosis not present

## 2015-12-13 DIAGNOSIS — M722 Plantar fascial fibromatosis: Secondary | ICD-10-CM

## 2015-12-13 DIAGNOSIS — M79671 Pain in right foot: Secondary | ICD-10-CM

## 2015-12-13 MED ORDER — TRIAMCINOLONE ACETONIDE 10 MG/ML IJ SUSP
10.0000 mg | Freq: Once | INTRAMUSCULAR | Status: AC
  Administered 2015-12-13: 10 mg

## 2015-12-13 MED ORDER — HYDROCODONE-ACETAMINOPHEN 10-325 MG PO TABS: 1.0000 | ORAL_TABLET | Freq: Three times a day (TID) | ORAL | Status: DC | PRN

## 2015-12-13 MED ORDER — PREDNISONE 10 MG PO TABS: ORAL_TABLET | ORAL | Status: DC

## 2015-12-13 NOTE — Progress Notes (Signed)
   Subjective:    Patient ID: Joshua [name], male    DOB: 02/23/1991, 25 y.o.   MRN: [id number]  HPI    Review of Systems  All other systems reviewed and are negative.      Objective:   Physical Exam        Assessment & Plan:

## 2015-12-14 NOTE — Progress Notes (Signed)
Subjective:     Patient ID: Joshua [name], male   DOB: 07/20/1991, 25 y.o.   MRN: [id number]  HPI patient presents after not being seen for a number of years with a lot of pain in his heels and his forefoot and states that his feet are flat and he has to work on cement floors. Also states he gets a reasonable amount of burning pain   Review of Systems  All other systems reviewed and are negative.      Objective:   Physical Exam  Constitutional: He is oriented to person, place, and time.  Cardiovascular: Intact distal pulses.   Musculoskeletal: Normal range of motion.  Neurological: He is oriented to person, place, and time.  Skin: Skin is warm.  Nursing note and vitals reviewed.  neurovascular status found to be intact muscle strength is adequate with range of motion within normal limits. 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 He has had numerous surgeries by me several years ago and they're all healing well with excellent range of motion first MPJ and good digital position.     Assessment:     Inflammatory fasciitis with possibility for low-grade neurological type symptoms due to the burning he is experiencing with flatfoot deformity as part of the complicating factor    Plan:     H&P and all conditions reviewed with patient. I went ahead today and I injected the plantar fascia bilateral 3 mg Kenalog 5 mg Xylocaine and applied fascial brace is to lift the arch is. Instructed on physical therapy supportive shoe gear and scanned for custom orthotics to reduce pressure against his feet. Placed on Sterapred DS 12 day Dosepak and also hydrocodone for nighttime usage and discussed long-term gabapentin treatment to try to help his condition  Hates that there is good healing of the osteotomies metatarsals first right and left and there is good digital position of all  digits

## 2015-12-21 ENCOUNTER — Telehealth: Payer: Self-pay | Admitting: *Deleted

## 2015-12-21 NOTE — Telephone Encounter (Signed)
Pt request refill of the Gabapentin.  I reviewed pt's medication orders and did not see Gabapentin, but did see Dr. Charlsie Merlesegal had discussed with pt in the 12/13/2015 appt.  I left message [phone] for pt to call his pharmacy and have them call our office, fax or send request electronically to our office and I would have Dr. Charlsie Merlesegal review, but if prescribed by another doctor the refill may have to come from there.

## 2016-01-03 ENCOUNTER — Ambulatory Visit: Payer: Managed Care, Other (non HMO) | Admitting: Podiatry

## 2016-01-23 NOTE — Progress Notes (Signed)
This encounter was created in error - please disregard.

## 2016-02-08 ENCOUNTER — Emergency Department
Admission: EM | Admit: 2016-02-08 | Discharge: 2016-02-08 | Disposition: A | Discharge status: Home / Self Care | Payer: Managed Care, Other (non HMO) | Attending: Emergency Medicine | Admitting: Emergency Medicine

## 2016-02-08 ENCOUNTER — Encounter: Payer: Self-pay | Admitting: Emergency Medicine

## 2016-02-08 DIAGNOSIS — Z79899 Other long term (current) drug therapy: Secondary | ICD-10-CM | POA: Insufficient documentation

## 2016-02-08 DIAGNOSIS — J45909 Unspecified asthma, uncomplicated: Secondary | ICD-10-CM | POA: Diagnosis not present

## 2016-02-08 DIAGNOSIS — Z792 Long term (current) use of antibiotics: Secondary | ICD-10-CM | POA: Insufficient documentation

## 2016-02-08 DIAGNOSIS — F17210 Nicotine dependence, cigarettes, uncomplicated: Secondary | ICD-10-CM | POA: Diagnosis not present

## 2016-02-08 DIAGNOSIS — K0889 Other specified disorders of teeth and supporting structures: Secondary | ICD-10-CM | POA: Insufficient documentation

## 2016-02-08 DIAGNOSIS — Z7952 Long term (current) use of systemic steroids: Secondary | ICD-10-CM | POA: Diagnosis not present

## 2016-02-08 DIAGNOSIS — Z791 Long term (current) use of non-steroidal anti-inflammatories (NSAID): Secondary | ICD-10-CM | POA: Insufficient documentation

## 2016-02-08 MED ORDER — OXYCODONE-ACETAMINOPHEN 5-325 MG PO TABS
ORAL_TABLET | ORAL | Status: AC
  Administered 2016-02-08: 1 via ORAL
  Filled 2016-02-08: qty 1

## 2016-02-08 MED ORDER — AMOXICILLIN 500 MG PO TABS: 500.0000 mg | ORAL_TABLET | Freq: Three times a day (TID) | ORAL | Status: DC

## 2016-02-08 MED ORDER — OXYCODONE-ACETAMINOPHEN 5-325 MG PO TABS
1.0000 | ORAL_TABLET | Freq: Once | ORAL | Status: AC
  Administered 2016-02-08: 1 via ORAL

## 2016-02-08 MED ORDER — AMOXICILLIN 500 MG PO CAPS
500.0000 mg | ORAL_CAPSULE | Freq: Once | ORAL | Status: AC
Start: 2016-02-08 — End: 2016-02-08
  Administered 2016-02-08: 500 mg via ORAL
  Filled 2016-02-08: qty 1

## 2016-02-08 MED ORDER — LIDOCAINE-EPINEPHRINE 2 %-1:100000 IJ SOLN
1.7000 mL | Freq: Once | INTRAMUSCULAR | Status: AC
  Administered 2016-02-08: 1.7 mL via INTRADERMAL
  Filled 2016-02-08: qty 1.7

## 2016-02-08 MED ORDER — LIDOCAINE VISCOUS 2 % MT SOLN: 15.0000 mL | Freq: Once | OROMUCOSAL | Status: DC

## 2016-02-08 MED ORDER — TRAMADOL HCL 50 MG PO TABS: 50.0000 mg | ORAL_TABLET | Freq: Four times a day (QID) | ORAL | Status: DC | PRN

## 2016-02-08 NOTE — ED Notes (Signed)
Pt verbalized understanding of discharge instructions. NAD at this time. 

## 2016-02-08 NOTE — ED Notes (Signed)
Dental pain x 3 days.  No resp distress

## 2016-02-08 NOTE — ED Provider Notes (Signed)
[facility] Emergency Department Provider Note  ____________________________________________  Time seen: Approximately 4:48 PM  I have reviewed the triage vital signs and the nursing notes.   HISTORY  Chief Complaint Dental Pain    HPI Joshua [name] is a 25 y.o. male who presents with acute dental pain today. He believes part of his tooth broke off earlier this morning. Have any worsening pain and spasms into the right jaw. No fevers or chills. No congestion. No sore throat.   Past Medical History  Diagnosis Date  . Bronchitis   . Asthma     There are no active problems to display for this patient.   Past Surgical History  Procedure Laterality Date  . Ankle/foot surgery with metal plates      Current Outpatient Rx  Name  Route  Sig  Dispense  Refill  . albuterol (PROVENTIL HFA;VENTOLIN HFA) 108 (90 BASE) MCG/ACT inhaler   Inhalation   Inhale 2 puffs into the lungs every 6 (six) hours as needed for wheezing or shortness of breath.   1 Inhaler   2   . albuterol (PROVENTIL HFA;VENTOLIN HFA) 108 (90 BASE) MCG/ACT inhaler   Inhalation   Inhale 2 puffs into the lungs every 4 (four) hours as needed for wheezing or shortness of breath.   1 Inhaler   0   . amoxicillin (AMOXIL) 500 MG tablet   Oral   Take 1 tablet (500 mg total) by mouth 3 (three) times daily.   30 tablet   0   . cephALEXin (KEFLEX) 500 MG capsule   Oral   Take 1 capsule (500 mg total) by mouth 4 (four) times daily. Patient not taking: Reported on 12/13/2015   28 capsule   0   . cyclobenzaprine (FLEXERIL) 10 MG tablet   Oral   Take 1 tablet (10 mg total) by mouth every 8 (eight) hours as needed for muscle spasms. Patient not taking: Reported on 12/13/2015   30 tablet   1   . HYDROcodone-acetaminophen (NORCO) 10-325 MG tablet   Oral   Take 1 tablet by mouth every 8 (eight) hours as needed.   30 tablet   0   . meloxicam (MOBIC) 15 MG tablet   Oral   Take 1 tablet (15  mg total) by mouth daily. Patient not taking: Reported on 12/13/2015   30 tablet   0   . predniSONE (DELTASONE) 10 MG tablet      12 day tapering dose   48 tablet   0   . traMADol (ULTRAM) 50 MG tablet   Oral   Take 1 tablet (50 mg total) by mouth every 6 (six) hours as needed. Patient not taking: Reported on 12/13/2015   12 tablet   0   . traMADol (ULTRAM) 50 MG tablet   Oral   Take 1 tablet (50 mg total) by mouth every 6 (six) hours as needed.   20 tablet   0     Allergies Review of patient's allergies indicates no known allergies.  History reviewed. No pertinent family history.  Social History Social History  Substance Use Topics  . Smoking status: Current Every Day Smoker -- 1.50 packs/day    Types: Cigarettes  . Smokeless tobacco: None  . Alcohol Use: No     Comment: occ    Review of Systems Constitutional: No fever/chills Eyes: No visual changes. ENT: No sore throat. Cardiovascular: Denies chest pain. Respiratory: Denies shortness of breath. Gastrointestinal: No abdominal pain.  No nausea, no vomiting.  No diarrhea.  No constipation. Genitourinary: Negative for dysuria. Musculoskeletal: Negative for back pain. Skin: Negative for rash. Neurological: Negative for headaches, focal weakness or numbness. 10-point ROS otherwise negative.  ____________________________________________   PHYSICAL EXAM:  VITAL SIGNS: ED Triage Vitals  Enc Vitals Group     BP 02/08/16 1544 133/78 mmHg     Pulse Rate 02/08/16 1544 94     Resp 02/08/16 1544 18     Temp 02/08/16 1544 98.8 F (37.1 C)     Temp Source 02/08/16 1544 Oral     SpO2 02/08/16 1544 99 %     Weight 02/08/16 1544 170 lb (77.111 kg)     Height 02/08/16 1544 5\' 10"  (1.778 m)     Head Cir --      Peak Flow --      Pain Score 02/08/16 1544 6     Pain Loc --      Pain Edu? --      Excl. in GC? --     Constitutional: Alert and oriented. Well appearing and in no acute distress. Eyes: Conjunctivae  are normal. PERRL. EOMI. Ears:  Clear with normal landmarks. No erythema. Head: Atraumatic. Nose: No congestion/rhinnorhea. Mouth/Throat: Mucous membranes are moist.  Oropharynx non-erythematous. No lesions. Fractured tooth noted right upper posterior molar Neck:  Supple.  No adenopathy.   Cardiovascular: Normal rate, regular rhythm. Grossly normal heart sounds.  Good peripheral circulation. Respiratory: Normal respiratory effort.  No retractions. Lungs CTAB. Musculoskeletal: Nml ROM of upper and lower extremity joints. Neurologic:  Normal speech and language. No gross focal neurologic deficits are appreciated. No gait instability. Skin:  Skin is warm, dry and intact. No rash noted. Psychiatric: Mood and affect are normal. Speech and behavior are normal.  ____________________________________________   LABS (all labs ordered are listed, but only abnormal results are displayed)  Labs Reviewed - No data to display ____________________________________________  EKG   ____________________________________________  RADIOLOGY   ____________________________________________   PROCEDURES  Procedure(s) performed: Perform dental block to the right upper posterior molar with lidocaine and epinephrine. Injection over the right posterior molar. Tolerated well. No complications Critical Care performed: No  ____________________________________________   INITIAL IMPRESSION / ASSESSMENT AND PLAN / ED COURSE  Pertinent labs & imaging results that were available during my care of the patient were reviewed by me and considered in my medical decision making (see chart for details).  25 year old with acute dental pain, per history of present illness. Performed a dental block. He reports benefit. Given amoxicillin and tramadol. Given handout on dentist for follow-up. Return to emergency for any worsening symptoms. ____________________________________________   FINAL CLINICAL IMPRESSION(S) / ED  DIAGNOSES  Final diagnoses:  Pain, dental      Ignacia BayleyRobert Tokiko Diefenderfer, PA-C 02/08/16 1652  Sharman CheekPhillip Stafford, MD 02/09/16 0011

## 2016-02-08 NOTE — Discharge Instructions (Signed)
Take antibiotics as directed. Use pain medicine as needed. Follow-up with dentist.    OPTIONS FOR DENTAL FOLLOW UP CARE  [place] Department of Health and Human Services - Local Safety Net Dental Clinics TripDoors.comhttp://www.ncdhhs.gov/dph/oralhealth/services/safetynetclinics.htm   [facility] [phone])  [facility] [phone])  [facility] [phone])  [facility] [phone])  [facility] [phone]) This clinic caters to the indigent population and is on a lottery system. Location: [employer], [address], [place] [facility] Hours: Wednesdays from 6pm - 9pm, patients seen by a lottery system. For dates, call or go to [email or web address] Services: Cleanings, fillings and simple extractions. Payment Options: DENTAL WORK IS FREE OF CHARGE. Bring proof of income or support. Best way to get seen: Arrive at 5:15 pm - this is a lottery, NOT first come/first serve, so arriving earlier will not increase your chances of being seen.     [facility] [phone] Select option 1 for emergencies   Location: [facility], [facility], [address], [place] [facility] Hours: No walk-ins accepted - call the day before to schedule an appointment. Check in times are 9:30 am and 1:30 pm. Services: Simple extractions, temporary fillings, pulpectomy/pulp debridement, uncomplicated abscess drainage. Payment Options: PAYMENT IS DUE AT THE TIME OF SERVICE.  Fee is usually $100-200, additional surgical procedures (e.g. abscess drainage) may be extra. Cash, checks, Visa/MasterCard accepted.  Can file Medicaid if patient is covered for dental - patient should call case worker to check. No discount for [facility] patients. Best way to get seen: MUST call the day before and get onto the schedule. Can usually be seen the next 1-2  days. No walk-ins accepted.     [facility] [phone]   Location: [facility], [address], [place] [facility] Hours: M, W, Th, F 8am or 1:30pm, Tues 9a or 1:30 - first come/first served. Services: Simple extractions, temporary fillings, uncomplicated abscess drainage.  You do not need to be [place] resident. Payment Options: PAYMENT IS DUE AT THE TIME OF SERVICE. Dental insurance, otherwise sliding scale - bring proof of income or support. Depending on income and treatment needed, cost is usually $50-200. Best way to get seen: Arrive early as it is first come/first served.     [facility] [phone]   Location: [address] Clinic Hours: Mon-Thu 8a-5p Services: Most basic dental services including extractions and fillings. Payment Options: PAYMENT IS DUE AT THE TIME OF SERVICE. Sliding scale, up to 50% off - bring proof if income or support. Medicaid with dental option accepted. Best way to get seen: Call to schedule an appointment, can usually be seen within 2 weeks OR they will try to see walk-ins - show up at 8a or 2p (you may have to wait).     [facility] [phone] [place] RESIDENTS ONLY   Location: [facility], [address], [place], [place] [address] Clinic Hours: By appointment only. Monday - Thursday 8am-5pm, Friday 8am-12pm Services: Cleanings, fillings, extractions. Payment Options: PAYMENT IS DUE AT THE TIME OF SERVICE. Cash, Visa or MasterCard. Sliding scale - $30 minimum per service. Best way to get seen: Come in to office, complete packet and make an appointment - need proof of income or support monies for each household member and proof of [place] residence. Usually takes about a month to get in.  [facility] [phone]   Location: [address],  [place] Clinic Hours: Walk-in Urgent Care Dental Services are offered Monday-Friday mornings only. The numbers of emergencies accepted daily is limited to the number of providers available. Maximum 15 - Mondays, Wednesdays & Thursdays Maximum 10 - Tuesdays & Fridays Services: You do not need to be a [facility] resident to be seen for a dental emergency. Emergencies are defined as pain, swelling, abnormal bleeding, or dental trauma. Walkins will receive x-rays if needed. NOTE: Dental cleaning is not an emergency. Payment Options: PAYMENT IS DUE AT THE TIME OF SERVICE. Minimum co-pay is $40.00 for uninsured patients. Minimum co-pay is $3.00 for Medicaid with dental coverage. Dental Insurance is accepted and must be presented at time of visit. Medicare does not cover dental. Forms of payment: Cash, credit card, checks. Best way to get seen: If not previously registered with the clinic, walk-in dental registration begins at 7:15 am and is on a first come/first serve basis. If previously registered with the clinic, call to make an appointment.     [facility] [place] ONLY   Location: [address], [place], [place] Clinic Hours: Mon-Thu 10a-2p Services: Extractions only! Payment Options: FREE (donations accepted) - bring proof of income or support Best way to get seen: Call and schedule an appointment OR come at 8am on the 1st Monday of every month (except for holidays) when it is first come/first served.     [facility] [phone]   Location: [place], [place] Clinic Hours: Friday mornings Services, Payment Options, Best way to get seen: Call for info

## 2016-02-28 ENCOUNTER — Encounter: Payer: Self-pay | Admitting: Podiatry

## 2016-02-28 ENCOUNTER — Ambulatory Visit (INDEPENDENT_AMBULATORY_CARE_PROVIDER_SITE_OTHER): Payer: Managed Care, Other (non HMO) | Admitting: Podiatry

## 2016-02-28 DIAGNOSIS — M722 Plantar fascial fibromatosis: Secondary | ICD-10-CM

## 2016-02-28 DIAGNOSIS — M79672 Pain in left foot: Secondary | ICD-10-CM

## 2016-02-28 DIAGNOSIS — M79671 Pain in right foot: Secondary | ICD-10-CM

## 2016-02-28 MED ORDER — GABAPENTIN 300 MG PO CAPS: 300.0000 mg | ORAL_CAPSULE | Freq: Three times a day (TID) | ORAL | Status: DC

## 2016-02-28 MED ORDER — TRIAMCINOLONE ACETONIDE 10 MG/ML IJ SUSP
10.0000 mg | Freq: Once | INTRAMUSCULAR | Status: AC
  Administered 2016-02-28: 10 mg

## 2016-02-28 MED ORDER — HYDROCODONE-IBUPROFEN 5-200 MG PO TABS: 1.0000 | ORAL_TABLET | Freq: Three times a day (TID) | ORAL | Status: DC | PRN

## 2016-02-28 NOTE — Patient Instructions (Signed)

## 2016-02-29 ENCOUNTER — Other Ambulatory Visit: Payer: Self-pay | Admitting: Sports Medicine

## 2016-02-29 DIAGNOSIS — M79673 Pain in unspecified foot: Secondary | ICD-10-CM

## 2016-02-29 MED ORDER — HYDROCODONE-ACETAMINOPHEN 5-325 MG PO TABS: 1.0000 | ORAL_TABLET | Freq: Four times a day (QID) | ORAL | Status: DC | PRN

## 2016-03-15 NOTE — Progress Notes (Signed)
Subjective:     Patient ID: Joshua [name], male   DOB: March 15, 1991, 25 y.o.   MRN: [id number]  HPI patient states he still has a lot of pain in his feet and he still has trouble with a lot of ambulation   Review of Systems     Objective:   Physical Exam Neurovascular status unchanged with discomfort in his plantar feet bilateral especially in the heels with depression of arch noted    Assessment:     Chronic foot pain still occurring    Plan:     I advised the eventually may need to utilize a pain clinic but at this time were to start him on gabapentin to give him a small dosage of Vicodin 5-3 25 and I reinjected the plantar fascia bilateral 3 mg Kenalog 5 mg Xylocaine. Patient will be seen back for reevaluation

## 2016-05-28 ENCOUNTER — Encounter: Payer: Self-pay | Admitting: Emergency Medicine

## 2016-05-28 ENCOUNTER — Emergency Department
Admission: EM | Admit: 2016-05-28 | Discharge: 2016-05-28 | Disposition: A | Discharge status: Home / Self Care | Payer: Self-pay | Attending: Emergency Medicine | Admitting: Emergency Medicine

## 2016-05-28 ENCOUNTER — Emergency Department: Payer: Self-pay

## 2016-05-28 DIAGNOSIS — J45909 Unspecified asthma, uncomplicated: Secondary | ICD-10-CM | POA: Insufficient documentation

## 2016-05-28 DIAGNOSIS — R1031 Right lower quadrant pain: Secondary | ICD-10-CM | POA: Insufficient documentation

## 2016-05-28 DIAGNOSIS — F17210 Nicotine dependence, cigarettes, uncomplicated: Secondary | ICD-10-CM | POA: Insufficient documentation

## 2016-05-28 DIAGNOSIS — Z79899 Other long term (current) drug therapy: Secondary | ICD-10-CM | POA: Insufficient documentation

## 2016-05-28 LAB — LIPASE, BLOOD: LIPASE: 23 U/L (ref 11–51)

## 2016-05-28 LAB — COMPREHENSIVE METABOLIC PANEL
ALBUMIN: 4.6 g/dL (ref 3.5–5.0)
ALK PHOS: 38 U/L (ref 38–126)
ALT: 15 U/L — ABNORMAL LOW (ref 17–63)
ANION GAP: 5 (ref 5–15)
AST: 17 U/L (ref 15–41)
BUN: 15 mg/dL (ref 6–20)
CALCIUM: 9.3 mg/dL (ref 8.9–10.3)
CO2: 27 mmol/L (ref 22–32)
CREATININE: 1.04 mg/dL (ref 0.61–1.24)
Chloride: 108 mmol/L (ref 101–111)
GFR calc Af Amer: >60 mL/min (ref >60)
GFR calc non Af Amer: >60 mL/min (ref >60)
Glucose, Bld: 99 mg/dL (ref 65–99)
POTASSIUM: 3.9 mmol/L (ref 3.5–5.1)
SODIUM: 140 mmol/L (ref 135–145)
TOTAL PROTEIN: 7.4 g/dL (ref 6.5–8.1)
Total Bilirubin: 0.7 mg/dL (ref 0.3–1.2)

## 2016-05-28 LAB — URINALYSIS COMPLETE WITH MICROSCOPIC (ARMC ONLY)
Bacteria, UA: NONE SEEN
Bilirubin Urine: NEGATIVE
GLUCOSE, UA: NEGATIVE mg/dL
Hgb urine dipstick: NEGATIVE
KETONES UR: NEGATIVE mg/dL
Leukocytes, UA: NEGATIVE
NITRITE: NEGATIVE
PROTEIN: NEGATIVE mg/dL
RBC / HPF: NONE SEEN RBC/hpf (ref 0–5)
SPECIFIC GRAVITY, URINE: 1.015 (ref 1.005–1.030)
Squamous Epithelial / LPF: NONE SEEN
pH: 7 (ref 5.0–8.0)

## 2016-05-28 LAB — CBC WITH DIFFERENTIAL/PLATELET
BASOS PCT: 1 %
Basophils Absolute: 0.1 10*3/uL (ref 0–0.1)
EOS ABS: 0.3 10*3/uL (ref 0–0.7)
Eosinophils Relative: 3 %
HCT: 44.6 % (ref 40.0–52.0)
HEMOGLOBIN: 14.9 g/dL (ref 13.0–18.0)
Lymphocytes Relative: 22 %
Lymphs Abs: 2.1 10*3/uL (ref 1.0–3.6)
MCH: 30.4 pg (ref 26.0–34.0)
MCHC: 33.4 g/dL (ref 32.0–36.0)
MCV: 91.1 fL (ref 80.0–100.0)
MONOS PCT: 10 %
Monocytes Absolute: 0.9 10*3/uL (ref 0.2–1.0)
NEUTROS PCT: 64 %
Neutro Abs: 6 10*3/uL (ref 1.4–6.5)
Platelets: 276 10*3/uL (ref 150–440)
RBC: 4.89 MIL/uL (ref 4.40–5.90)
RDW: 12.2 % (ref 11.5–14.5)
WBC: 9.4 10*3/uL (ref 3.8–10.6)

## 2016-05-28 MED ORDER — SODIUM CHLORIDE 0.9 % IV BOLUS (SEPSIS)
1000.0000 mL | Freq: Once | INTRAVENOUS | Status: AC
  Administered 2016-05-28: 1000 mL via INTRAVENOUS

## 2016-05-28 MED ORDER — IOPAMIDOL (ISOVUE-300) INJECTION 61%
30.0000 mL | Freq: Once | INTRAVENOUS | Status: AC
  Administered 2016-05-28: 30 mL via ORAL
  Filled 2016-05-28: qty 30

## 2016-05-28 MED ORDER — MORPHINE SULFATE (PF) 4 MG/ML IV SOLN
4.0000 mg | Freq: Once | INTRAVENOUS | Status: AC
  Administered 2016-05-28: 4 mg via INTRAVENOUS
  Filled 2016-05-28: qty 1

## 2016-05-28 MED ORDER — IOPAMIDOL (ISOVUE-300) INJECTION 61%
100.0000 mL | Freq: Once | INTRAVENOUS | Status: AC | PRN
  Administered 2016-05-28: 100 mL via INTRAVENOUS
  Filled 2016-05-28: qty 100

## 2016-05-28 MED ORDER — DICYCLOMINE HCL 20 MG PO TABS: 20.0000 mg | ORAL_TABLET | Freq: Three times a day (TID) | ORAL | 0 refills | Status: DC | PRN

## 2016-05-28 MED ORDER — ONDANSETRON HCL 4 MG/2ML IJ SOLN
4.0000 mg | Freq: Once | INTRAMUSCULAR | Status: AC
  Administered 2016-05-28: 4 mg via INTRAVENOUS
  Filled 2016-05-28: qty 2

## 2016-05-28 NOTE — ED Triage Notes (Signed)
C/o right side abd pain and n/v x 1

## 2016-05-28 NOTE — ED Provider Notes (Signed)
[facility] Emergency Department Provider Note    ____________________________________________   I have reviewed the triage vital signs and the nursing notes.   HISTORY  Chief Complaint Abdominal Pain   History limited by: Not Limited   HPI Joshua [name] is a 25 y.o. male who presents to the emergency department today because of abdominal pain. States that it started yesterday. Has been progressively getting worse. It is located in the right lower quadrant. He did have associated nausea with one episode of emesis today. He has not had any measured fevers but has felt hot. He denies similar pain in the past. No diarrhea or bowel movements today.   Past Medical History:  Diagnosis Date  . Asthma   . Bronchitis     There are no active problems to display for this patient.   Past Surgical History:  Procedure Laterality Date  . ankle/foot surgery with metal plates      Prior to Admission medications   Medication Sig Start Date End Date Taking? Authorizing Provider  albuterol (PROVENTIL HFA;VENTOLIN HFA) 108 (90 BASE) MCG/ACT inhaler Inhale 2 puffs into the lungs every 6 (six) hours as needed for wheezing or shortness of breath. 05/21/15   Charmayne Sheer Beers, PA-C  gabapentin (NEURONTIN) 300 MG capsule Take 1 capsule (300 mg total) by mouth 3 (three) times daily. 02/28/16   Lenn Sink, DPM  HYDROcodone-acetaminophen (NORCO/VICODIN) 5-325 MG tablet Take 1 tablet by mouth every 6 (six) hours as needed for moderate pain. 02/29/16   Titorya Stover, DPM  hydrocodone-ibuprofen (VICOPROFEN) 5-200 MG tablet Take 1 tablet by mouth every 8 (eight) hours as needed for pain. 02/28/16   Lenn Sink, DPM    Allergies Review of patient's allergies indicates no known allergies.  No family history on file.  Social History Social History  Substance Use Topics  . Smoking status: Current Every Day Smoker    Packs/day: 1.50    Types: Cigarettes  . Smokeless tobacco:  Never Used  . Alcohol use No     Comment: occ    Review of Systems  Constitutional: Negative for fever. Cardiovascular: Negative for chest pain. Respiratory: Negative for shortness of breath. Gastrointestinal: Positive for right lower quadrant pain. Genitourinary: Negative for dysuria. Musculoskeletal: Negative for back pain. Skin: Negative for rash. Neurological: Negative for headaches, focal weakness or numbness.  10-point ROS otherwise negative.  ____________________________________________   PHYSICAL EXAM:  VITAL SIGNS: ED Triage Vitals  Enc Vitals Group     BP 05/28/16 1414 (!) 148/77     Pulse Rate 05/28/16 1414 67     Resp 05/28/16 1414 18     Temp 05/28/16 1414 98.2 F (36.8 C)     Temp Source 05/28/16 1414 Oral     SpO2 05/28/16 1414 100 %     Weight 05/28/16 1415 165 lb (74.8 kg)     Height 05/28/16 1415 5\' 10"  (1.778 m)   Constitutional: Alert and oriented. Well appearing and in no distress. Eyes: Conjunctivae are normal. Normal extraocular movements. ENT   Head: Normocephalic and atraumatic.   Nose: No congestion/rhinnorhea.   Mouth/Throat: Mucous membranes are moist.   Neck: No stridor. Hematological/Lymphatic/Immunilogical: No cervical lymphadenopathy. Cardiovascular: Normal rate, regular rhythm.  No murmurs, rubs, or gallops. Respiratory: Normal respiratory effort without tachypnea nor retractions. Breath sounds are clear and equal bilaterally. No wheezes/rales/rhonchi. Gastrointestinal: Soft. Tender to palpation in the right lower quadrant. Positive rovsings sign. Genitourinary: Deferred Musculoskeletal: Normal range of motion in all  extremities. No lower extremity edema. Neurologic:  Normal speech and language. No gross focal neurologic deficits are appreciated.  Skin:  Skin is warm, dry and intact. No rash noted. Psychiatric: Mood and affect are normal. Speech and behavior are normal. Patient exhibits appropriate insight and  judgment.  ____________________________________________    LABS (pertinent positives/negatives)  Labs Reviewed  COMPREHENSIVE METABOLIC PANEL - Abnormal; Notable for the following:       Result Value   ALT 15 (*)    All other components within normal limits  URINALYSIS COMPLETEWITH MICROSCOPIC ([facility] ONLY) - Abnormal; Notable for the following:    Color, Urine YELLOW (*)    APPearance CLEAR (*)    All other components within normal limits  CBC WITH DIFFERENTIAL/PLATELET  LIPASE, BLOOD     ____________________________________________   EKG  None  ____________________________________________    RADIOLOGY  CT abd/pel IMPRESSION:  1. No explanation for RIGHT lower quadrant pain.  2. Normal appendix.     ____________________________________________   PROCEDURES  Procedures  ____________________________________________   INITIAL IMPRESSION / ASSESSMENT AND PLAN / ED COURSE  Pertinent labs & imaging results that were available during my care of the patient were reviewed by me and considered in my medical decision making (see chart for details).  Patient presented to the emergency department today with concerns for right lower quadrant pain. Exam he was tender in the right lower quadrant with positive Rovsing sign. Because of this a CT abdomen and pelvis was obtained. This did not show any signs of appendicitis. This point given negative CT scan I think possible viral illness or food poisoning likely. Will give patient prescription for Bentyl and return precautions. ____________________________________________   FINAL CLINICAL IMPRESSION(S) / ED DIAGNOSES  Final diagnoses:  Right lower quadrant abdominal pain     Note: This dictation was prepared with Dragon dictation. Any transcriptional errors that result from this process are unintentional    Joshua SemenGraydon Ermina Oberman, MD 05/28/16 [phone]

## 2016-05-28 NOTE — Discharge Instructions (Signed)
Please seek medical attention for any high fevers, chest pain, shortness of breath, change in behavior, persistent vomiting, bloody stool or any other new or concerning symptoms.  

## 2016-09-25 ENCOUNTER — Emergency Department
Admission: EM | Admit: 2016-09-25 | Discharge: 2016-09-25 | Discharge status: Absent without leave | Payer: Managed Care, Other (non HMO)

## 2016-09-25 NOTE — ED Notes (Signed)
Called for patient x2. No response.

## 2016-09-29 ENCOUNTER — Emergency Department
Admission: EM | Admit: 2016-09-29 | Discharge: 2016-09-29 | Disposition: A | Discharge status: Home / Self Care | Payer: Self-pay | Attending: Emergency Medicine | Admitting: Emergency Medicine

## 2016-09-29 ENCOUNTER — Encounter: Payer: Self-pay | Admitting: Intensive Care

## 2016-09-29 ENCOUNTER — Emergency Department: Payer: Self-pay

## 2016-09-29 DIAGNOSIS — J45909 Unspecified asthma, uncomplicated: Secondary | ICD-10-CM | POA: Insufficient documentation

## 2016-09-29 DIAGNOSIS — K529 Noninfective gastroenteritis and colitis, unspecified: Secondary | ICD-10-CM | POA: Insufficient documentation

## 2016-09-29 DIAGNOSIS — Z79899 Other long term (current) drug therapy: Secondary | ICD-10-CM | POA: Insufficient documentation

## 2016-09-29 DIAGNOSIS — F17210 Nicotine dependence, cigarettes, uncomplicated: Secondary | ICD-10-CM | POA: Insufficient documentation

## 2016-09-29 HISTORY — DX: Compression of brain: G93.5

## 2016-09-29 LAB — COMPREHENSIVE METABOLIC PANEL
ALT: 14 U/L — AB (ref 17–63)
AST: 20 U/L (ref 15–41)
Albumin: 4.9 g/dL (ref 3.5–5.0)
Alkaline Phosphatase: 47 U/L (ref 38–126)
Anion gap: 7 (ref 5–15)
BUN: 15 mg/dL (ref 6–20)
CALCIUM: 9.2 mg/dL (ref 8.9–10.3)
CHLORIDE: 105 mmol/L (ref 101–111)
CO2: 26 mmol/L (ref 22–32)
CREATININE: 1.37 mg/dL — AB (ref 0.61–1.24)
Glucose, Bld: 101 mg/dL — ABNORMAL HIGH (ref 65–99)
Potassium: 4.2 mmol/L (ref 3.5–5.1)
Sodium: 138 mmol/L (ref 135–145)
TOTAL PROTEIN: 8.3 g/dL — AB (ref 6.5–8.1)
Total Bilirubin: 0.4 mg/dL (ref 0.3–1.2)

## 2016-09-29 LAB — CBC
HCT: 47.6 % (ref 40.0–52.0)
Hemoglobin: 16.2 g/dL (ref 13.0–18.0)
MCH: 30.2 pg (ref 26.0–34.0)
MCHC: 34.1 g/dL (ref 32.0–36.0)
MCV: 88.6 fL (ref 80.0–100.0)
PLATELETS: 313 10*3/uL (ref 150–440)
RBC: 5.37 MIL/uL (ref 4.40–5.90)
RDW: 12.1 % (ref 11.5–14.5)
WBC: 11.4 10*3/uL — ABNORMAL HIGH (ref 3.8–10.6)

## 2016-09-29 LAB — TYPE AND SCREEN
ABO/RH(D): A POS
ANTIBODY SCREEN: NEGATIVE

## 2016-09-29 LAB — SEDIMENTATION RATE: SED RATE: 2 mm/h (ref 0–15)

## 2016-09-29 LAB — LIPASE, BLOOD: Lipase: 29 U/L (ref 11–51)

## 2016-09-29 MED ORDER — CIPROFLOXACIN HCL 500 MG PO TABS: 500.0000 mg | ORAL_TABLET | Freq: Two times a day (BID) | ORAL | 0 refills | Status: DC

## 2016-09-29 MED ORDER — SODIUM CHLORIDE 0.9 % IV BOLUS (SEPSIS)
1000.0000 mL | Freq: Once | INTRAVENOUS | Status: AC
  Administered 2016-09-29: 1000 mL via INTRAVENOUS

## 2016-09-29 MED ORDER — MORPHINE SULFATE (PF) 4 MG/ML IV SOLN
4.0000 mg | Freq: Once | INTRAVENOUS | Status: AC
  Administered 2016-09-29: 4 mg via INTRAVENOUS
  Filled 2016-09-29: qty 1

## 2016-09-29 MED ORDER — METRONIDAZOLE 500 MG PO TABS: 500.0000 mg | ORAL_TABLET | Freq: Three times a day (TID) | ORAL | 0 refills | Status: DC

## 2016-09-29 MED ORDER — METRONIDAZOLE 500 MG PO TABS
500.0000 mg | ORAL_TABLET | Freq: Once | ORAL | Status: AC
  Administered 2016-09-29: 500 mg via ORAL

## 2016-09-29 MED ORDER — IOPAMIDOL (ISOVUE-300) INJECTION 61%
30.0000 mL | Freq: Once | INTRAVENOUS | Status: AC | PRN
  Administered 2016-09-29: 30 mL via ORAL
  Filled 2016-09-29: qty 30

## 2016-09-29 MED ORDER — HYDROCODONE-ACETAMINOPHEN 5-325 MG PO TABS: 1.0000 | ORAL_TABLET | Freq: Four times a day (QID) | ORAL | 0 refills | Status: DC | PRN

## 2016-09-29 MED ORDER — CIPROFLOXACIN HCL 500 MG PO TABS
500.0000 mg | ORAL_TABLET | Freq: Once | ORAL | Status: AC
  Administered 2016-09-29: 500 mg via ORAL

## 2016-09-29 MED ORDER — ONDANSETRON 4 MG PO TBDP
ORAL_TABLET | ORAL | Status: AC
  Administered 2016-09-29: 4 mg via ORAL
  Filled 2016-09-29: qty 1

## 2016-09-29 MED ORDER — ONDANSETRON 4 MG PO TBDP
4.0000 mg | ORAL_TABLET | Freq: Once | ORAL | Status: AC
  Administered 2016-09-29: 4 mg via ORAL

## 2016-09-29 MED ORDER — HYDROCODONE-ACETAMINOPHEN 5-325 MG PO TABS
ORAL_TABLET | ORAL | Status: AC
  Administered 2016-09-29: 1 via ORAL
  Filled 2016-09-29: qty 1

## 2016-09-29 MED ORDER — ONDANSETRON 4 MG PO TBDP: 4.0000 mg | ORAL_TABLET | Freq: Four times a day (QID) | ORAL | 0 refills | Status: DC | PRN

## 2016-09-29 MED ORDER — HYDROCODONE-ACETAMINOPHEN 5-325 MG PO TABS
1.0000 | ORAL_TABLET | Freq: Once | ORAL | Status: AC
  Administered 2016-09-29: 1 via ORAL

## 2016-09-29 MED ORDER — ONDANSETRON HCL 4 MG/2ML IJ SOLN
4.0000 mg | Freq: Once | INTRAMUSCULAR | Status: AC
  Administered 2016-09-29: 4 mg via INTRAVENOUS
  Filled 2016-09-29: qty 2

## 2016-09-29 MED ORDER — CIPROFLOXACIN HCL 500 MG PO TABS
ORAL_TABLET | ORAL | Status: AC
  Administered 2016-09-29: 500 mg via ORAL
  Filled 2016-09-29: qty 1

## 2016-09-29 MED ORDER — IOPAMIDOL (ISOVUE-300) INJECTION 61%
100.0000 mL | Freq: Once | INTRAVENOUS | Status: AC | PRN
  Administered 2016-09-29: 100 mL via INTRAVENOUS
  Filled 2016-09-29: qty 100

## 2016-09-29 MED ORDER — METRONIDAZOLE 500 MG PO TABS
ORAL_TABLET | ORAL | Status: AC
  Administered 2016-09-29: 500 mg via ORAL
  Filled 2016-09-29: qty 1

## 2016-09-29 NOTE — ED Notes (Signed)
Pt reports he vomited last nigh, diarrhea, had blood from on emesis and when he had last BM this morning he noticed bright red blood. Pt reports last emesis around lunch time. Pt reports still nausea, reports fever at was 101 took Ibuprofen. Reports started to feel lower back pain and left testicle pain. Pt talks in complete sentence no respiratory distress pt is awake alert and oriented

## 2016-09-29 NOTE — Discharge Instructions (Signed)
? ?  Please return to the emergency room right away if you are to develop a fever, severe nausea, your pain becomes severe or worsens, you are unable to keep food down, begin vomiting any dark or bloody fluid, you develop any dark or bloody stools, feel dehydrated, or other new concerns or symptoms arise. ? ?

## 2016-09-29 NOTE — ED Notes (Signed)
Pt alert and oriented X4, active, cooperative, pt in NAD. RR even and unlabored, color WNL.  Pt informed to return if any life threatening symptoms occur.   

## 2016-09-29 NOTE — ED Provider Notes (Addendum)
[facility] Emergency Department Provider Note   ____________________________________________   First MD Initiated Contact with Patient 09/29/16 2020     (approximate)  I have reviewed the triage vital signs and the nursing notes.   HISTORY  Chief Complaint Rectal Bleeding and Fever    HPI Joshua [name] is a 26 y.o. male reports for about the last 3 days he's had a bag sense of discomfort in his left lower abdomen, radiates somewhat towards his left groin. Then last night began having multiple episodes of vomiting, pain primarily in the left lower abdomen, and at about 2:00 this afternoon he noticed red blood in his stool.  He reports moderate pain in the left lower abdomen, also radiates pain towards his left groin and testicle area. No pain or swelling in the groin or testicle itself  Reports he felt like he had a fever this morning, associated nausea.  Denies any history of ulcerative colitis, Crohn's, or inflammatory bowel disease, and denies any recent sick contacts. He does however note that when he was 16 he had episodes of blood in his stool and perhaps a similar symptoms, but does not have a formal diagnosis and that went away   Past Medical History:  Diagnosis Date  . Asthma   . Bronchitis   . Hernia cerebri (HCC)     There are no active problems to display for this patient.   Past Surgical History:  Procedure Laterality Date  . ankle/foot surgery with metal plates      Prior to Admission medications   Medication Sig Start Date End Date Taking? Authorizing Provider  albuterol (PROVENTIL HFA;VENTOLIN HFA) 108 (90 BASE) MCG/ACT inhaler Inhale 2 puffs into the lungs every 6 (six) hours as needed for wheezing or shortness of breath. 05/21/15   Charmayne Sheer Beers, PA-C  dicyclomine (BENTYL) 20 MG tablet Take 1 tablet (20 mg total) by mouth 3 (three) times daily as needed (abdominal pain). 05/28/16   Phineas Semen, MD  gabapentin (NEURONTIN)  300 MG capsule Take 1 capsule (300 mg total) by mouth 3 (three) times daily. 02/28/16   Lenn Sink, DPM  HYDROcodone-acetaminophen (NORCO/VICODIN) 5-325 MG tablet Take 1 tablet by mouth every 6 (six) hours as needed for moderate pain. 02/29/16   Titorya Stover, DPM  hydrocodone-ibuprofen (VICOPROFEN) 5-200 MG tablet Take 1 tablet by mouth every 8 (eight) hours as needed for pain. 02/28/16   Lenn Sink, DPM    Allergies Patient has no known allergies.  History reviewed. No pertinent family history.  Social History Social History  Substance Use Topics  . Smoking status: Current Every Day Smoker    Packs/day: 1.50    Types: Cigarettes  . Smokeless tobacco: Never Used  . Alcohol use No     Comment: occ    Review of Systems Constitutional: Felt warm with some chills this morning Eyes: No visual changes. ENT: No sore throat. Cardiovascular: Denies chest pain. Respiratory: Denies shortness of breath. Gastrointestinal: See history of present illness  No constipation. Genitourinary: Negative for dysuria. Musculoskeletal: Negative for back pain. Skin: Negative for rash. Neurological: Negative for headaches, focal weakness or numbness.  10-point ROS otherwise negative.  ____________________________________________   PHYSICAL EXAM:  VITAL SIGNS: ED Triage Vitals  Enc Vitals Group     BP 09/29/16 1720 (!) 139/91     Pulse Rate 09/29/16 1720 90     Resp 09/29/16 1720 16     Temp 09/29/16 1720 98.2 F (36.8 C)  Temp Source 09/29/16 1720 Oral     SpO2 09/29/16 1720 98 %     Weight 09/29/16 1721 175 lb (79.4 kg)     Height 09/29/16 1721 5\' 10"  (1.778 m)     Head Circumference --      Peak Flow --      Pain Score 09/29/16 1721 6     Pain Loc --      Pain Edu? --      Excl. in GC? --     Constitutional: Alert and oriented. Appears mildly ill, somewhat uncomfortable reporting pain in his left lower abdomen. Eyes: Conjunctivae are normal. PERRL. EOMI. Head:  Atraumatic. Nose: No congestion/rhinnorhea. Mouth/Throat: Mucous membranes are moist.  Oropharynx non-erythematous. Neck: No stridor.   Cardiovascular: Normal rate, regular rhythm. Grossly normal heart sounds.  Good peripheral circulation. Respiratory: Normal respiratory effort.  No retractions. Lungs CTAB. Gastrointestinal: Soft and nontender except for moderate tenderness in the left lower quadrant without rebound or guarding.. No distention. No abdominal bruits. No CVA tenderness. No groin mass. No hernia palpated. Testicles descended nontender with normal-appearing penis and scrotum. The perineum is normal. Rectal exam performed demonstrates negative Hemoccult, green somewhat looser stool without any bloodiness. He does report when rectal exam is performed that it causes pain to radiate up into his lower left quadrant. Musculoskeletal: No lower extremity tenderness nor edema.   Neurologic:  Normal speech and language. No gross focal neurologic deficits are appreciated. Skin:  Skin is warm, dry and intact. No rash noted. Psychiatric: Mood and affect are normal. Speech and behavior are normal.  ____________________________________________   LABS (all labs ordered are listed, but only abnormal results are displayed)  Labs Reviewed  COMPREHENSIVE METABOLIC PANEL - Abnormal; Notable for the following:       Result Value   Glucose, Bld 101 (*)    Creatinine, Ser 1.37 (*)    Total Protein 8.3 (*)    ALT 14 (*)    All other components within normal limits  CBC - Abnormal; Notable for the following:    WBC 11.4 (*)    All other components within normal limits  GASTROINTESTINAL PANEL BY PCR, STOOL (REPLACES STOOL CULTURE)  SEDIMENTATION RATE  LIPASE, BLOOD  POC OCCULT BLOOD, ED  TYPE AND SCREEN   ____________________________________________  EKG   ____________________________________________  RADIOLOGY  Ct Abdomen Pelvis W Contrast  Result Date: 09/29/2016 CLINICAL DATA:  Blood  in stool with vomiting for 2 days and fever EXAM: CT ABDOMEN AND PELVIS WITH CONTRAST TECHNIQUE: Multidetector CT imaging of the abdomen and pelvis was performed using the standard protocol following bolus administration of intravenous contrast. CONTRAST:  100mL ISOVUE-300 IOPAMIDOL (ISOVUE-300) INJECTION 61% COMPARISON:  05/28/2016 FINDINGS: Lower chest: Lung bases are clear.  Heart size is normal. Hepatobiliary: No focal hepatic abnormality. Slight prominence of the central intrahepatic bile ducts. Contracted gallbladder. No calcified stones. Extrahepatic common bowel duct prominent at 8 mm. Pancreas: Unremarkable. No pancreatic ductal dilatation or surrounding inflammatory changes. Spleen: Normal in size without focal abnormality. Adrenals/Urinary Tract: Adrenal glands are unremarkable. Kidneys are normal, without renal calculi, focal lesion, or hydronephrosis. Bladder is unremarkable. Stomach/Bowel: The stomach is nonenlarged. There is no dilated small bowel. Appendix normal. There is mild wall thickening involving the transverse colon and the descending and sigmoid colon. Vascular/Lymphatic: No significant vascular findings are present. No enlarged abdominal or pelvic lymph nodes. Reproductive: Few prostate gland calcifications.  No enlargement Other: No abdominal wall hernia or abnormality. No abdominopelvic ascites. Musculoskeletal: No  acute or significant osseous findings. IMPRESSION: 1. Mild wall thickening involving the transverse, descending and sigmoid colon suggestive of a mild colitis as may be seen with infectious or inflammatory bowel disease. Normal appendix. 2. Mild intra and extrahepatic biliary dilatation of uncertain significance. Suggest correlation with laboratory values. Gallbladder is contracted. Electronically Signed   By: Jasmine Pang M.D.   On: 09/29/2016 22:01    ____________________________________________   PROCEDURES  Procedure(s) performed: None  Procedures  Critical  Care performed: No  ____________________________________________   INITIAL IMPRESSION / ASSESSMENT AND PLAN / ED COURSE  Pertinent labs & imaging results that were available during my care of the patient were reviewed by me and considered in my medical decision making (see chart for details).  Differential diagnosis includes but is not limited to, abdominal perforation, aortic dissection, cholecystitis, appendicitis, diverticulitis, colitis, esophagitis/gastritis, kidney stone, pyelonephritis, urinary tract infection, Crohns, Ulc colitis, etc.. All are considered in decision and treatment plan. Based upon the patient's presentation and risk factors, proceed with a CT scan given the focality of symptoms. I'm primarily concerned about a colitis/diverticulitis-like picture and this patient. Especially given he reports something similar occurring at a younger age.  Labs reviewed, no indication of thrombocytopenia, HUS, TTP. CT shows evidence of colitis, appears to fit his clinical profile well. Given the patient's unclear differentiation as the cause, but feeling this is unlikely to represent HUS, I will provide the patient prescription for Cipro and Flagyl, Zofran as well as limited supply of Vicodin. I have advised him closely to follow-up with gastroenterology, and discuss careful return precautions  I will prescribe the patient a narcotic pain medicine due to their condition which I anticipate will cause at least moderate pain short term. I discussed with the patient safe use of narcotic pain medicines, and that they are not to drive, work in dangerous areas, or ever take more than prescribed (no more than 1 pill every 6 hours). We discussed that this is the type of medication that can be  overdosed on and the risks of this type of medicine. Patient is very agreeable to only use as prescribed and to never use more than prescribed.  Patient appears clinically stable and appropriate for outpatient  management with close follow-up.  Return precautions and treatment recommendations and follow-up discussed with the patient who is agreeable with the plan.        ____________________________________________   FINAL CLINICAL IMPRESSION(S) / ED DIAGNOSES  Final diagnoses:  Colitis      NEW MEDICATIONS STARTED DURING THIS VISIT:  New Prescriptions   No medications on file     Note:  This document was prepared using Dragon voice recognition software and may include unintentional dictation errors.     Sharyn Creamer, MD 09/29/16 2320  Patient reports he is not driving himself home. Family will pick him up. Understands not to drive while taking hydrocodone or this evening.   Sharyn Creamer, MD 09/29/16 [phone]

## 2016-09-29 NOTE — ED Triage Notes (Signed)
Patient presents to ER with c/o rectal bleeding, nausea, emesis, fevers, and lower back pain. Pt reports when he is finished urinating his L testical will have have severe pain off and on. Denies abnormal d/c from penis. Pt ambulatory in triage with NAD noted. HX hernia in groin area, rectal bleeding per patient. A&O x3. Denies SOB or chest pain

## 2016-11-14 ENCOUNTER — Emergency Department
Admission: EM | Admit: 2016-11-14 | Discharge: 2016-11-14 | Disposition: A | Discharge status: Home / Self Care | Payer: Managed Care, Other (non HMO) | Attending: Emergency Medicine | Admitting: Emergency Medicine

## 2016-11-14 ENCOUNTER — Encounter: Payer: Self-pay | Admitting: Medical Oncology

## 2016-11-14 DIAGNOSIS — Y990 Civilian activity done for income or pay: Secondary | ICD-10-CM | POA: Insufficient documentation

## 2016-11-14 DIAGNOSIS — Z79899 Other long term (current) drug therapy: Secondary | ICD-10-CM | POA: Insufficient documentation

## 2016-11-14 DIAGNOSIS — W268XXA Contact with other sharp object(s), not elsewhere classified, initial encounter: Secondary | ICD-10-CM | POA: Insufficient documentation

## 2016-11-14 DIAGNOSIS — J45909 Unspecified asthma, uncomplicated: Secondary | ICD-10-CM | POA: Insufficient documentation

## 2016-11-14 DIAGNOSIS — F17210 Nicotine dependence, cigarettes, uncomplicated: Secondary | ICD-10-CM | POA: Insufficient documentation

## 2016-11-14 DIAGNOSIS — Y9389 Activity, other specified: Secondary | ICD-10-CM | POA: Insufficient documentation

## 2016-11-14 DIAGNOSIS — S71112A Laceration without foreign body, left thigh, initial encounter: Secondary | ICD-10-CM | POA: Insufficient documentation

## 2016-11-14 DIAGNOSIS — Y929 Unspecified place or not applicable: Secondary | ICD-10-CM | POA: Insufficient documentation

## 2016-11-14 DIAGNOSIS — S81812A Laceration without foreign body, left lower leg, initial encounter: Secondary | ICD-10-CM

## 2016-11-14 MED ORDER — OXYCODONE-ACETAMINOPHEN 5-325 MG PO TABS: 1.0000 | ORAL_TABLET | Freq: Four times a day (QID) | ORAL | 0 refills | Status: DC | PRN

## 2016-11-14 MED ORDER — SULFAMETHOXAZOLE-TRIMETHOPRIM 800-160 MG PO TABS
1.0000 | ORAL_TABLET | Freq: Once | ORAL | Status: AC
Start: 2016-11-14 — End: 2016-11-14
  Administered 2016-11-14: 1 via ORAL
  Filled 2016-11-14: qty 1

## 2016-11-14 MED ORDER — IBUPROFEN 600 MG PO TABS: 600.0000 mg | ORAL_TABLET | Freq: Three times a day (TID) | ORAL | 0 refills | Status: DC | PRN

## 2016-11-14 MED ORDER — BACITRACIN ZINC 500 UNIT/GM EX OINT
TOPICAL_OINTMENT | CUTANEOUS | Status: AC
  Administered 2016-11-14: 1 via TOPICAL
  Filled 2016-11-14: qty 0.9

## 2016-11-14 MED ORDER — BACITRACIN ZINC 500 UNIT/GM EX OINT
TOPICAL_OINTMENT | Freq: Two times a day (BID) | CUTANEOUS | Status: DC
  Administered 2016-11-14: 1 via TOPICAL

## 2016-11-14 MED ORDER — TRAMADOL HCL 50 MG PO TABS
50.0000 mg | ORAL_TABLET | Freq: Once | ORAL | Status: AC
  Administered 2016-11-14: 50 mg via ORAL
  Filled 2016-11-14: qty 1

## 2016-11-14 MED ORDER — NEOMYCIN-POLYMYXIN-PRAMOXINE 1 % EX CREA: TOPICAL_CREAM | Freq: Two times a day (BID) | CUTANEOUS | 0 refills | Status: DC

## 2016-11-14 MED ORDER — SULFAMETHOXAZOLE-TRIMETHOPRIM 800-160 MG PO TABS: 1.0000 | ORAL_TABLET | Freq: Two times a day (BID) | ORAL | 0 refills | Status: DC

## 2016-11-14 NOTE — ED Notes (Signed)
Pt states had accident at work yesterday ( no wc), pt has wound to lft upper thigh, states he super glued it to stop the bleeding. Redness around area noted, Denies fevers.

## 2016-11-14 NOTE — ED Provider Notes (Signed)
[facility] Emergency Department Provider Note   ____________________________________________   None    (approximate)  I have reviewed the triage vital signs and the nursing notes.   HISTORY  Chief Complaint Wound Infection    HPI Joshua [name] is a 26 y.o. male patient complaining of laceration to the anterior upper left thigh. Patient state he is working yesterday morning with a grinding wheel fell off of the mount  and lacerated his left leg. Patient state he applied superglue stopped the bleeding.Patient stated areas become red with increased pain. Patient rates pain as a 7/10. No other palliative measures for this complaint. Patient described a pain as "achy". Patient state tetanus shot is up-to-date.   Past Medical History:  Diagnosis Date  . Asthma   . Bronchitis   . Hernia cerebri (HCC)     There are no active problems to display for this patient.   Past Surgical History:  Procedure Laterality Date  . ankle/foot surgery with metal plates      Prior to Admission medications   Medication Sig Start Date End Date Taking? Authorizing Provider  albuterol (PROVENTIL HFA;VENTOLIN HFA) 108 (90 BASE) MCG/ACT inhaler Inhale 2 puffs into the lungs every 6 (six) hours as needed for wheezing or shortness of breath. 05/21/15   Charmayne Sheer Beers, PA-C  ciprofloxacin (CIPRO) 500 MG tablet Take 1 tablet (500 mg total) by mouth 2 (two) times daily. 09/29/16   Sharyn Creamer, MD  dicyclomine (BENTYL) 20 MG tablet Take 1 tablet (20 mg total) by mouth 3 (three) times daily as needed (abdominal pain). 05/28/16   Phineas Semen, MD  gabapentin (NEURONTIN) 300 MG capsule Take 1 capsule (300 mg total) by mouth 3 (three) times daily. 02/28/16   Lenn Sink, DPM  HYDROcodone-acetaminophen (NORCO/VICODIN) 5-325 MG tablet Take 1 tablet by mouth every 6 (six) hours as needed for moderate pain. 09/29/16   Sharyn Creamer, MD  ibuprofen (ADVIL,MOTRIN) 600 MG tablet Take 1 tablet (600 mg  total) by mouth every 8 (eight) hours as needed. 11/14/16   Joni Reining, PA-C  metroNIDAZOLE (FLAGYL) 500 MG tablet Take 1 tablet (500 mg total) by mouth 3 (three) times daily. 09/29/16   Sharyn Creamer, MD  neomycin-polymyxin-pramoxine (NEOSPORIN PLUS) 1 % cream Apply topically 2 (two) times daily. 11/14/16   Joni Reining, PA-C  ondansetron (ZOFRAN ODT) 4 MG disintegrating tablet Take 1 tablet (4 mg total) by mouth every 6 (six) hours as needed for nausea or vomiting. 09/29/16   Sharyn Creamer, MD  oxyCODONE-acetaminophen (ROXICET) 5-325 MG tablet Take 1 tablet by mouth every 6 (six) hours as needed for moderate pain. 11/14/16   Joni Reining, PA-C  sulfamethoxazole-trimethoprim (BACTRIM DS,SEPTRA DS) 800-160 MG tablet Take 1 tablet by mouth 2 (two) times daily. 11/14/16   Joni Reining, PA-C    Allergies Patient has no known allergies.  No family history on file.  Social History Social History  Substance Use Topics  . Smoking status: Current Every Day Smoker    Packs/day: 1.50    Types: Cigarettes  . Smokeless tobacco: Never Used  . Alcohol use No     Comment: occ    Review of Systems Constitutional: No fever/chills Eyes: No visual changes. ENT: No sore throat. Cardiovascular: Denies chest pain. Respiratory: Denies shortness of breath. Gastrointestinal: No abdominal pain.  No nausea, no vomiting.  No diarrhea.  No constipation. Genitourinary: Negative for dysuria. Musculoskeletal: Negative for back pain. Skin: Negative for rash.  Leg laceration  Neurological: Negative for headaches, focal weakness or numbness.  10-point ROS otherwise negative.  ____________________________________________   PHYSICAL EXAM:  VITAL SIGNS: ED Triage Vitals  Enc Vitals Group     BP 11/14/16 1836 135/90     Pulse Rate 11/14/16 1836 83     Resp 11/14/16 1836 16     Temp 11/14/16 1836 97.6 F (36.4 C)     Temp Source 11/14/16 1836 Oral     SpO2 11/14/16 1836 98 %     Weight 11/14/16 1835 175 lb  (79.4 kg)     Height 11/14/16 1835 5\' 10"  (1.778 m)     Head Circumference --      Peak Flow --      Pain Score 11/14/16 1835 6     Pain Loc --      Pain Edu? --      Excl. in GC? --     Constitutional: Alert and oriented. Well appearing and in no acute distress. Eyes: Conjunctivae are normal. PERRL. EOMI. Head: Atraumatic. Nose: No congestion/rhinnorhea. Mouth/Throat: Mucous membranes are moist.  Oropharynx non-erythematous. Neck: No stridor.  No cervical spine tenderness to palpation. Hematological/Lymphatic/Immunilogical: No cervical lymphadenopathy. Cardiovascular: Normal rate, regular rhythm. Grossly normal heart sounds.  Good peripheral circulation. Respiratory: Normal respiratory effort.  No retractions. Lungs CTAB. Gastrointestinal: Soft and nontender. No distention. No abdominal bruits. No CVA tenderness. Musculoskeletal: No lower extremity tenderness nor edema.  No joint effusions. Neurologic:  Normal speech and language. No gross focal neurologic deficits are appreciated. No gait instability. Skin:  5 cm laceration anterior left thigh for area of erythema radiating 2 cm from the wound.Marland Kitchen. Psychiatric: Mood and affect are normal. Speech and behavior are normal.  ____________________________________________   LABS (all labs ordered are listed, but only abnormal results are displayed)  Labs Reviewed - No data to display ____________________________________________  EKG   ____________________________________________  RADIOLOGY   ____________________________________________   PROCEDURES  Procedure(s) performed: None  Procedures  Critical Care performed: No  ____________________________________________   INITIAL IMPRESSION / ASSESSMENT AND PLAN / ED COURSE  Pertinent labs & imaging results that were available during my care of the patient were reviewed by me and considered in my medical decision making (see chart for details).  Left upper leg laceration.  Discussed with patient rationale for not suturing this time. Patient given Bactrim DS, Neosporin, Percocet, and ibuprofen. Patient advised follow-up in 3 days if no improvement.       ____________________________________________   FINAL CLINICAL IMPRESSION(S) / ED DIAGNOSES  Final diagnoses:  Leg laceration, left, initial encounter      NEW MEDICATIONS STARTED DURING THIS VISIT:  New Prescriptions   IBUPROFEN (ADVIL,MOTRIN) 600 MG TABLET    Take 1 tablet (600 mg total) by mouth every 8 (eight) hours as needed.   NEOMYCIN-POLYMYXIN-PRAMOXINE (NEOSPORIN PLUS) 1 % CREAM    Apply topically 2 (two) times daily.   OXYCODONE-ACETAMINOPHEN (ROXICET) 5-325 MG TABLET    Take 1 tablet by mouth every 6 (six) hours as needed for moderate pain.   SULFAMETHOXAZOLE-TRIMETHOPRIM (BACTRIM DS,SEPTRA DS) 800-160 MG TABLET    Take 1 tablet by mouth 2 (two) times daily.     Note:  This document was prepared using Dragon voice recognition software and may include unintentional dictation errors.    Joni Reiningonald K Niambi Smoak, PA-C 11/14/16 1905    Joni Reiningonald K Zakkiyya Barno, PA-C 11/14/16 1906    Merrily BrittleNeil Rifenbark, MD 11/14/16 Windell [id number]

## 2016-11-14 NOTE — ED Triage Notes (Signed)
Pt reports he was at work when one of the machines wheels flew off and hit him in left upper leg.

## 2017-03-14 ENCOUNTER — Emergency Department
Admission: EM | Admit: 2017-03-14 | Discharge: 2017-03-15 | Disposition: A | Discharge status: Home / Self Care | Payer: Self-pay | Attending: Emergency Medicine | Admitting: Emergency Medicine

## 2017-03-14 ENCOUNTER — Encounter: Payer: Self-pay | Admitting: Emergency Medicine

## 2017-03-14 DIAGNOSIS — J45909 Unspecified asthma, uncomplicated: Secondary | ICD-10-CM | POA: Insufficient documentation

## 2017-03-14 DIAGNOSIS — L0100 Impetigo, unspecified: Secondary | ICD-10-CM | POA: Insufficient documentation

## 2017-03-14 DIAGNOSIS — F17210 Nicotine dependence, cigarettes, uncomplicated: Secondary | ICD-10-CM | POA: Insufficient documentation

## 2017-03-14 DIAGNOSIS — R590 Localized enlarged lymph nodes: Secondary | ICD-10-CM | POA: Insufficient documentation

## 2017-03-14 DIAGNOSIS — Z79899 Other long term (current) drug therapy: Secondary | ICD-10-CM | POA: Insufficient documentation

## 2017-03-14 DIAGNOSIS — K029 Dental caries, unspecified: Secondary | ICD-10-CM | POA: Insufficient documentation

## 2017-03-14 DIAGNOSIS — M542 Cervicalgia: Secondary | ICD-10-CM | POA: Insufficient documentation

## 2017-03-14 LAB — CBC WITH DIFFERENTIAL/PLATELET
Basophils Absolute: 0.1 10*3/uL (ref 0–0.1)
Basophils Relative: 1 %
Eosinophils Absolute: 0.4 10*3/uL (ref 0–0.7)
Eosinophils Relative: 6 %
HCT: 45.1 % (ref 40.0–52.0)
Hemoglobin: 15.4 g/dL (ref 13.0–18.0)
Lymphocytes Relative: 30 %
Lymphs Abs: 2 10*3/uL (ref 1.0–3.6)
MCH: 31.2 pg (ref 26.0–34.0)
MCHC: 34.2 g/dL (ref 32.0–36.0)
MCV: 91.2 fL (ref 80.0–100.0)
Monocytes Absolute: 1 10*3/uL (ref 0.2–1.0)
Monocytes Relative: 15 %
Neutro Abs: 3.2 10*3/uL (ref 1.4–6.5)
Neutrophils Relative %: 48 %
Platelets: 213 10*3/uL (ref 150–440)
RBC: 4.94 MIL/uL (ref 4.40–5.90)
RDW: 12.3 % (ref 11.5–14.5)
WBC: 6.6 10*3/uL (ref 3.8–10.6)

## 2017-03-14 LAB — COMPREHENSIVE METABOLIC PANEL
ALT: 13 U/L — ABNORMAL LOW (ref 17–63)
AST: 19 U/L (ref 15–41)
Albumin: 3.6 g/dL (ref 3.5–5.0)
Alkaline Phosphatase: 39 U/L (ref 38–126)
Anion gap: 6 (ref 5–15)
BUN: 10 mg/dL (ref 6–20)
CO2: 28 mmol/L (ref 22–32)
Calcium: 8.4 mg/dL — ABNORMAL LOW (ref 8.9–10.3)
Chloride: 105 mmol/L (ref 101–111)
Creatinine, Ser: 1.33 mg/dL — ABNORMAL HIGH (ref 0.61–1.24)
GFR calc Af Amer: >60 mL/min (ref >60)
GFR calc non Af Amer: >60 mL/min (ref >60)
Glucose, Bld: 96 mg/dL (ref 65–99)
Potassium: 3.6 mmol/L (ref 3.5–5.1)
Sodium: 139 mmol/L (ref 135–145)
Total Bilirubin: 0.5 mg/dL (ref 0.3–1.2)
Total Protein: 6.1 g/dL — ABNORMAL LOW (ref 6.5–8.1)

## 2017-03-14 MED ORDER — LIDOCAINE VISCOUS 2 % MT SOLN
OROMUCOSAL | Status: AC
  Filled 2017-03-14: qty 15

## 2017-03-14 MED ORDER — LIDOCAINE VISCOUS 2 % MT SOLN
15.0000 mL | Freq: Once | OROMUCOSAL | Status: AC
  Administered 2017-03-14: 15 mL via OROMUCOSAL

## 2017-03-14 NOTE — ED Triage Notes (Signed)
Patient with complaint of pain to his upper and lower gums that started this morning.

## 2017-03-14 NOTE — ED Provider Notes (Signed)
[facility] Emergency Department Provider Note  ____________________________________________  Time seen: Approximately 11:37 PM  I have reviewed the triage vital signs and the nursing notes.   HISTORY  Chief Complaint Dental Pain    HPI Joshua [name] is a 26 y.o. male presenting to the emergency department with "gum pain". Patient states that he noticed ulceration along the left upper jaw gum line and "white patches". Patient has also noticed a 1.5 cm x 1.5 cm circumferential round, tender and immobile mass along the left midline neck. Patient states that he has had some weight loss. He denies fever and chills. Patient states that he was tested negative for HIV 6 months ago. Patient denies any type of recreational drug use. He denies environmental exposures to the mouth. He has been tolerating fluids by mouth and his own secretions. No alleviating measures have been attempted.   Past Medical History:  Diagnosis Date  . Asthma   . Bronchitis   . Hernia cerebri (HCC)     There are no active problems to display for this patient.   Past Surgical History:  Procedure Laterality Date  . ankle/foot surgery with metal plates      Prior to Admission medications   Medication Sig Start Date End Date Taking? Authorizing Provider  albuterol (PROVENTIL HFA;VENTOLIN HFA) 108 (90 BASE) MCG/ACT inhaler Inhale 2 puffs into the lungs every 6 (six) hours as needed for wheezing or shortness of breath. 05/21/15   Beers, Charmayne Sheerharles M, PA-C  ciprofloxacin (CIPRO) 500 MG tablet Take 1 tablet (500 mg total) by mouth 2 (two) times daily. 09/29/16   Sharyn CreamerQuale, Shourya, MD  dicyclomine (BENTYL) 20 MG tablet Take 1 tablet (20 mg total) by mouth 3 (three) times daily as needed (abdominal pain). 05/28/16   Phineas SemenGoodman, Graydon, MD  gabapentin (NEURONTIN) 300 MG capsule Take 1 capsule (300 mg total) by mouth 3 (three) times daily. 02/28/16   Lenn Sinkegal, Norman S, DPM  HYDROcodone-acetaminophen (NORCO/VICODIN)  5-325 MG tablet Take 1 tablet by mouth every 6 (six) hours as needed for moderate pain. 09/29/16   Sharyn CreamerQuale, Cayton, MD  ibuprofen (ADVIL,MOTRIN) 600 MG tablet Take 1 tablet (600 mg total) by mouth every 8 (eight) hours as needed. 11/14/16   Joni ReiningSmith, Ronald K, PA-C  metroNIDAZOLE (FLAGYL) 500 MG tablet Take 1 tablet (500 mg total) by mouth 3 (three) times daily. 09/29/16   Sharyn CreamerQuale, Weslee, MD  neomycin-polymyxin-pramoxine (NEOSPORIN PLUS) 1 % cream Apply topically 2 (two) times daily. 11/14/16   Joni ReiningSmith, Ronald K, PA-C  ondansetron (ZOFRAN ODT) 4 MG disintegrating tablet Take 1 tablet (4 mg total) by mouth every 6 (six) hours as needed for nausea or vomiting. 09/29/16   Sharyn CreamerQuale, Artie, MD  oxyCODONE-acetaminophen (ROXICET) 5-325 MG tablet Take 1 tablet by mouth every 6 (six) hours as needed for moderate pain. 11/14/16   Joni ReiningSmith, Ronald K, PA-C  sulfamethoxazole-trimethoprim (BACTRIM DS,SEPTRA DS) 800-160 MG tablet Take 1 tablet by mouth 2 (two) times daily. 11/14/16   Joni ReiningSmith, Ronald K, PA-C    Allergies Patient has no known allergies.  No family history on file.  Social History Social History  Substance Use Topics  . Smoking status: Current Every Day Smoker    Packs/day: 1.50    Types: Cigarettes  . Smokeless tobacco: Never Used  . Alcohol use No     Review of Systems  Constitutional: No fever/chills Eyes: No visual changes. No discharge WJX:BJYNWGNENT:Patient has palpable mass of the neck. Cardiovascular: no chest pain. Respiratory: no cough. No  SOB. Gastrointestinal: No abdominal pain.  No nausea, no vomiting.  No diarrhea.  No constipation. Musculoskeletal: Negative for musculoskeletal pain. Skin: Patient has ulceration of the mouth. Neurological: Negative for headaches, focal weakness or numbness.  ____________________________________________   PHYSICAL EXAM:  VITAL SIGNS: ED Triage Vitals [03/14/17 2254]  Enc Vitals Group     BP (!) 166/84     Pulse Rate 79     Resp 18     Temp 98.4 F (36.9 C)      Temp Source Oral     SpO2 100 %     Weight 160 lb (72.6 kg)     Height 5\' 11"  (1.803 m)     Head Circumference      Peak Flow      Pain Score 10     Pain Loc      Pain Edu?      Excl. in GC?      Constitutional: Alert and oriented. Well appearing and in no acute distress. Eyes: Conjunctivae are normal. PERRL. EOMI. Head: Atraumatic. ENT:      Ears: Tympanic membranes are pearly bilaterally.      Nose: No congestion/rhinnorhea.      Mouth/Throat: Mucous membranes are moist. Numerous regions of ulceration surrounded by erythematous halos. Hematological/Lymphatic/Immunilogical: Patient has a 1.5 cm x 1.5 cm round, palpable mass along the left midline neck that is tender to the touch. Cardiovascular: Normal rate, regular rhythm. Normal S1 and S2.  Good peripheral circulation. Respiratory: Normal respiratory effort without tachypnea or retractions. Lungs CTAB. Good air entry to the bases with no decreased or absent breath sounds. Gastrointestinal: Bowel sounds 4 quadrants. Soft and nontender to palpation. No guarding or rigidity. No palpable masses. No distention. No CVA tenderness. Musculoskeletal: Full range of motion to all extremities. No gross deformities appreciated. Neurologic:  Normal speech and language. No gross focal neurologic deficits are appreciated.  Skin: Patient has an erythematous rash with honey colored crusts exudating serosanguineous fluid along the chin. Psychiatric: Mood and affect are normal. Speech and behavior are normal. Patient exhibits appropriate insight and judgement.   ____________________________________________   LABS (all labs ordered are listed, but only abnormal results are displayed)  Labs Reviewed  COMPREHENSIVE METABOLIC PANEL - Abnormal; Notable for the following:       Result Value   Creatinine, Ser 1.33 (*)    Calcium 8.4 (*)    Total Protein 6.1 (*)    ALT 13 (*)    All other components within normal limits  CBC WITH  DIFFERENTIAL/PLATELET   ____________________________________________  EKG   ____________________________________________  RADIOLOGY  No results found.  ____________________________________________    PROCEDURES  Procedure(s) performed:    Procedures    Medications  lidocaine (XYLOCAINE) 2 % viscous mouth solution (not administered)  lidocaine (XYLOCAINE) 2 % viscous mouth solution 15 mL (15 mLs Mouth/Throat Given 03/14/17 2338)  iopamidol (ISOVUE-300) 61 % injection 75 mL (75 mLs Intravenous Contrast Given 03/15/17 0008)     ____________________________________________   INITIAL IMPRESSION / ASSESSMENT AND PLAN / ED COURSE  Pertinent labs & imaging results that were available during my care of the patient were reviewed by me and considered in my medical decision making (see chart for details).  Review of the [place] CSRS was performed in accordance of the [facility] prior to dispensing any controlled drugs.     Assessment and plan Gingivostomatitis Anterior Neck Pain Patient presents to the emergency department with oral ulcerations surrounded by an erythematous halo consistent  with gingivostomatitis. As patient has had recent weight loss with 1.5 cm by 1.5 cm palpable mass along left anterior neck, CT was warranted. Basic labs and CT were ordered as patient was transitioned to main side of the emergency department for further care and management as [facility] closed for the evening. Dr. Manson Passey assumed patient care. All patient questions were answered.  ___________________________________________  FINAL CLINICAL IMPRESSION(S) / ED DIAGNOSES  Final diagnoses:  Neck pain      NEW MEDICATIONS STARTED DURING THIS VISIT:  New Prescriptions   No medications on file        This chart was dictated using voice recognition software/Dragon. Despite best efforts to proofread, errors can occur which can change the meaning. Any change was purely unintentional.    Orvil Feil, PA-C 03/15/17 0013    Darci Current, MD 03/15/17 [phone]

## 2017-03-14 NOTE — ED Notes (Signed)
Pt complains of gum pain to entire mouth. Pt states he also has lymph node tenderness to left lower jaw. Pt also complains of "sore" on chin that has had clear drainage. Sore appears herpetic in nature. Pt with ulceration noted along inner left upper gumline. Pt denies fever.

## 2017-03-15 ENCOUNTER — Emergency Department: Payer: Self-pay

## 2017-03-15 ENCOUNTER — Encounter: Payer: Self-pay | Admitting: Radiology

## 2017-03-15 MED ORDER — OXYCODONE-ACETAMINOPHEN 5-325 MG PO TABS
1.0000 | ORAL_TABLET | Freq: Once | ORAL | Status: AC
  Administered 2017-03-15: 1 via ORAL
  Filled 2017-03-15: qty 1

## 2017-03-15 MED ORDER — KETOROLAC TROMETHAMINE 10 MG PO TABS: 10.0000 mg | ORAL_TABLET | Freq: Four times a day (QID) | ORAL | 0 refills | Status: DC | PRN

## 2017-03-15 MED ORDER — CEPHALEXIN 500 MG PO CAPS
500.0000 mg | ORAL_CAPSULE | Freq: Once | ORAL | Status: AC
  Administered 2017-03-15: 500 mg via ORAL
  Filled 2017-03-15: qty 1

## 2017-03-15 MED ORDER — IOPAMIDOL (ISOVUE-300) INJECTION 61%
75.0000 mL | Freq: Once | INTRAVENOUS | Status: AC | PRN
  Administered 2017-03-15: 75 mL via INTRAVENOUS
  Filled 2017-03-15: qty 75

## 2017-03-15 MED ORDER — CEPHALEXIN 500 MG PO CAPS: 500.0000 mg | ORAL_CAPSULE | Freq: Two times a day (BID) | ORAL | 0 refills | Status: AC

## 2017-03-15 NOTE — ED Notes (Signed)
Pt returned from ct

## 2017-04-11 ENCOUNTER — Emergency Department
Admission: EM | Admit: 2017-04-11 | Discharge: 2017-04-11 | Disposition: A | Discharge status: Home / Self Care | Payer: Managed Care, Other (non HMO) | Attending: Emergency Medicine | Admitting: Emergency Medicine

## 2017-04-11 ENCOUNTER — Emergency Department: Payer: Managed Care, Other (non HMO)

## 2017-04-11 DIAGNOSIS — R4781 Slurred speech: Secondary | ICD-10-CM | POA: Insufficient documentation

## 2017-04-11 DIAGNOSIS — Z5321 Procedure and treatment not carried out due to patient leaving prior to being seen by health care provider: Secondary | ICD-10-CM | POA: Insufficient documentation

## 2017-04-11 DIAGNOSIS — M79604 Pain in right leg: Secondary | ICD-10-CM | POA: Diagnosis not present

## 2017-04-11 DIAGNOSIS — R51 Headache: Secondary | ICD-10-CM | POA: Insufficient documentation

## 2017-04-11 DIAGNOSIS — M545 Low back pain: Secondary | ICD-10-CM | POA: Insufficient documentation

## 2017-04-11 DIAGNOSIS — M25571 Pain in right ankle and joints of right foot: Secondary | ICD-10-CM | POA: Diagnosis not present

## 2017-04-11 DIAGNOSIS — M542 Cervicalgia: Secondary | ICD-10-CM | POA: Diagnosis not present
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
## 2017-04-11 NOTE — ED Notes (Signed)
Pt refusing CT scan. States "i want a MRI".

## 2017-04-11 NOTE — ED Notes (Signed)
Pt refusing to wear c- collar. Pt advised of dangers of taking it off. Placed back by this RN.

## 2017-04-11 NOTE — ED Triage Notes (Signed)
Pt presents via EMS following [facility]. Pt with multiple complaints. Reports lower back pain, face pain, neck pain, right leg pain, and right ankle pain. Pt drowsy in triage, slurring words.

## 2017-04-13 ENCOUNTER — Emergency Department
Admission: EM | Admit: 2017-04-13 | Discharge: 2017-04-13 | Disposition: A | Discharge status: Home / Self Care | Payer: No Typology Code available for payment source | Attending: Emergency Medicine | Admitting: Emergency Medicine

## 2017-04-13 ENCOUNTER — Emergency Department: Payer: No Typology Code available for payment source

## 2017-04-13 DIAGNOSIS — J45909 Unspecified asthma, uncomplicated: Secondary | ICD-10-CM | POA: Insufficient documentation

## 2017-04-13 DIAGNOSIS — F17210 Nicotine dependence, cigarettes, uncomplicated: Secondary | ICD-10-CM | POA: Insufficient documentation

## 2017-04-13 DIAGNOSIS — Y999 Unspecified external cause status: Secondary | ICD-10-CM | POA: Insufficient documentation

## 2017-04-13 DIAGNOSIS — M79644 Pain in right finger(s): Secondary | ICD-10-CM | POA: Insufficient documentation

## 2017-04-13 DIAGNOSIS — S0511XA Contusion of eyeball and orbital tissues, right eye, initial encounter: Secondary | ICD-10-CM | POA: Insufficient documentation

## 2017-04-13 DIAGNOSIS — Y929 Unspecified place or not applicable: Secondary | ICD-10-CM | POA: Diagnosis not present

## 2017-04-13 DIAGNOSIS — Y9389 Activity, other specified: Secondary | ICD-10-CM | POA: Insufficient documentation

## 2017-04-13 DIAGNOSIS — Z79899 Other long term (current) drug therapy: Secondary | ICD-10-CM | POA: Insufficient documentation

## 2017-04-13 DIAGNOSIS — M545 Low back pain: Secondary | ICD-10-CM | POA: Diagnosis present

## 2017-04-13 MED ORDER — NAPROXEN 500 MG PO TBEC: 500.0000 mg | DELAYED_RELEASE_TABLET | Freq: Two times a day (BID) | ORAL | 0 refills | Status: DC
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
## 2017-04-13 NOTE — ED Notes (Signed)
Pt provided warm tap water in place of urine specimen. Lab confirmed that the specimen was not urine. RN explained to pt that he falsely provided a specimen that was unusable as it was not urine.

## 2017-08-25 DIAGNOSIS — N179 Acute kidney failure, unspecified: Secondary | ICD-10-CM

## 2017-08-25 DIAGNOSIS — M6282 Rhabdomyolysis: Secondary | ICD-10-CM

## 2017-08-25 DIAGNOSIS — J690 Pneumonitis due to inhalation of food and vomit: Secondary | ICD-10-CM

## 2017-08-25 DIAGNOSIS — R570 Cardiogenic shock: Secondary | ICD-10-CM

## 2017-08-25 HISTORY — DX: Cardiogenic shock: R57.0

## 2017-08-25 HISTORY — DX: Rhabdomyolysis: M62.82

## 2017-08-25 HISTORY — DX: Acute kidney failure, unspecified: N17.9

## 2017-08-25 HISTORY — DX: Pneumonitis due to inhalation of food and vomit: J69.0

## 2017-10-03 ENCOUNTER — Other Ambulatory Visit: Payer: Self-pay

## 2017-10-03 ENCOUNTER — Emergency Department: Payer: Self-pay

## 2017-10-03 ENCOUNTER — Emergency Department
Admission: EM | Admit: 2017-10-03 | Discharge: 2017-10-03 | Disposition: A | Discharge status: Home / Self Care | Payer: Self-pay | Attending: Emergency Medicine | Admitting: Emergency Medicine

## 2017-10-03 ENCOUNTER — Encounter: Payer: Self-pay | Admitting: Emergency Medicine

## 2017-10-03 DIAGNOSIS — M25541 Pain in joints of right hand: Secondary | ICD-10-CM | POA: Insufficient documentation

## 2017-10-03 DIAGNOSIS — F17210 Nicotine dependence, cigarettes, uncomplicated: Secondary | ICD-10-CM | POA: Insufficient documentation

## 2017-10-03 DIAGNOSIS — M25542 Pain in joints of left hand: Secondary | ICD-10-CM | POA: Insufficient documentation

## 2017-10-03 DIAGNOSIS — M25561 Pain in right knee: Secondary | ICD-10-CM | POA: Insufficient documentation

## 2017-10-03 DIAGNOSIS — M25562 Pain in left knee: Secondary | ICD-10-CM | POA: Insufficient documentation

## 2017-10-03 DIAGNOSIS — Z79899 Other long term (current) drug therapy: Secondary | ICD-10-CM | POA: Insufficient documentation

## 2017-10-03 DIAGNOSIS — J45909 Unspecified asthma, uncomplicated: Secondary | ICD-10-CM | POA: Insufficient documentation

## 2017-10-03 LAB — CBC WITH DIFFERENTIAL/PLATELET
BASOS PCT: 1 %
Basophils Absolute: 0.1 10*3/uL (ref 0–0.1)
EOS ABS: 0.2 10*3/uL (ref 0–0.7)
EOS PCT: 2 %
HEMATOCRIT: 44.6 % (ref 40.0–52.0)
Hemoglobin: 15 g/dL (ref 13.0–18.0)
Lymphocytes Relative: 15 %
Lymphs Abs: 1.6 10*3/uL (ref 1.0–3.6)
MCH: 31.7 pg (ref 26.0–34.0)
MCHC: 33.7 g/dL (ref 32.0–36.0)
MCV: 94.2 fL (ref 80.0–100.0)
MONO ABS: 0.9 10*3/uL (ref 0.2–1.0)
MONOS PCT: 8 %
NEUTROS ABS: 8.4 10*3/uL — AB (ref 1.4–6.5)
Neutrophils Relative %: 74 %
PLATELETS: 267 10*3/uL (ref 150–440)
RBC: 4.73 MIL/uL (ref 4.40–5.90)
RDW: 12.5 % (ref 11.5–14.5)
WBC: 11.2 10*3/uL — ABNORMAL HIGH (ref 3.8–10.6)

## 2017-10-03 LAB — BASIC METABOLIC PANEL
Anion gap: 9 (ref 5–15)
BUN: 14 mg/dL (ref 6–20)
CALCIUM: 9 mg/dL (ref 8.9–10.3)
CO2: 26 mmol/L (ref 22–32)
CREATININE: 1.07 mg/dL (ref 0.61–1.24)
Chloride: 105 mmol/L (ref 101–111)
GFR calc Af Amer: >60 mL/min (ref >60)
GFR calc non Af Amer: >60 mL/min (ref >60)
GLUCOSE: 130 mg/dL — AB (ref 65–99)
Potassium: 3.9 mmol/L (ref 3.5–5.1)
Sodium: 140 mmol/L (ref 135–145)

## 2017-10-03 LAB — URIC ACID: Uric Acid, Serum: 6.6 mg/dL (ref 4.4–7.6)

## 2017-10-03 LAB — SEDIMENTATION RATE: SED RATE: 3 mm/h (ref 0–15)

## 2017-10-03 MED ORDER — NAPROXEN 500 MG PO TABS: 500.0000 mg | ORAL_TABLET | Freq: Two times a day (BID) | ORAL | Status: DC

## 2017-10-03 NOTE — ED Provider Notes (Signed)
[facility] Emergency Department Provider Note   ____________________________________________   First MD Initiated Contact with Patient 10/03/17 1428     (approximate)  I have reviewed the triage vital signs and the nursing notes.   HISTORY  Chief Complaint Knee Pain    HPI Joshua [name] is a 27 y.o. male patient complained of bilateral nontraumatic knee pain greater than 1 month.  Patient also complaining of pain in the hands and feet.  Patient rates pain as a 10/10.  Patient described the pain is "acute".  No palates measured for complaint.  Patient is anxious and concerned secondary to history of rheumatoid arthritis in the family occurring at an early age.  Past Medical History:  Diagnosis Date  . Asthma   . Bronchitis   . Hernia cerebri (HCC)     There are no active problems to display for this patient.   Past Surgical History:  Procedure Laterality Date  . ankle/foot surgery with metal plates      Prior to Admission medications   Medication Sig Start Date End Date Taking? Authorizing Provider  albuterol (PROVENTIL HFA;VENTOLIN HFA) 108 (90 BASE) MCG/ACT inhaler Inhale 2 puffs into the lungs every 6 (six) hours as needed for wheezing or shortness of breath. 05/21/15   Beers, Charmayne Sheer, PA-C  ciprofloxacin (CIPRO) 500 MG tablet Take 1 tablet (500 mg total) by mouth 2 (two) times daily. 09/29/16   Sharyn Creamer, MD  dicyclomine (BENTYL) 20 MG tablet Take 1 tablet (20 mg total) by mouth 3 (three) times daily as needed (abdominal pain). 05/28/16   Phineas Semen, MD  gabapentin (NEURONTIN) 300 MG capsule Take 1 capsule (300 mg total) by mouth 3 (three) times daily. 02/28/16   Lenn Sink, DPM  HYDROcodone-acetaminophen (NORCO/VICODIN) 5-325 MG tablet Take 1 tablet by mouth every 6 (six) hours as needed for moderate pain. 09/29/16   Sharyn Creamer, MD  ibuprofen (ADVIL,MOTRIN) 600 MG tablet Take 1 tablet (600 mg total) by mouth every 8 (eight) hours as  needed. 11/14/16   Joni Reining, PA-C  ketorolac (TORADOL) 10 MG tablet Take 1 tablet (10 mg total) by mouth every 6 (six) hours as needed. 03/15/17   Darci Current, MD  metroNIDAZOLE (FLAGYL) 500 MG tablet Take 1 tablet (500 mg total) by mouth 3 (three) times daily. 09/29/16   Sharyn Creamer, MD  naproxen (NAPROSYN) 500 MG tablet Take 1 tablet (500 mg total) by mouth 2 (two) times daily with a meal. 10/03/17   Joni Reining, PA-C  neomycin-polymyxin-pramoxine (NEOSPORIN PLUS) 1 % cream Apply topically 2 (two) times daily. 11/14/16   Joni Reining, PA-C  ondansetron (ZOFRAN ODT) 4 MG disintegrating tablet Take 1 tablet (4 mg total) by mouth every 6 (six) hours as needed for nausea or vomiting. 09/29/16   Sharyn Creamer, MD  oxyCODONE-acetaminophen (ROXICET) 5-325 MG tablet Take 1 tablet by mouth every 6 (six) hours as needed for moderate pain. 11/14/16   Joni Reining, PA-C  sulfamethoxazole-trimethoprim (BACTRIM DS,SEPTRA DS) 800-160 MG tablet Take 1 tablet by mouth 2 (two) times daily. 11/14/16   Joni Reining, PA-C    Allergies Patient has no known allergies.  No family history on file.  Social History Social History   Tobacco Use  . Smoking status: Current Every Day Smoker    Packs/day: 0.50    Types: Cigarettes  . Smokeless tobacco: Never Used  Substance Use Topics  . Alcohol use: No  . Drug use:  No    Review of Systems Constitutional: No fever/chills Eyes: No visual changes. ENT: No sore throat. Cardiovascular: Denies chest pain. Respiratory: Denies shortness of breath. Gastrointestinal: No abdominal pain.  No nausea, no vomiting.  No diarrhea.  No constipation. Genitourinary: Negative for dysuria. Musculoskeletal: Upper and lower extremity joint pain. Skin: Negative for rash. Neurological: Negative for headaches, focal weakness or numbness.   ____________________________________________   PHYSICAL EXAM:  VITAL SIGNS: ED Triage Vitals  Enc Vitals Group     BP  10/03/17 1328 (!) 164/84     Pulse Rate 10/03/17 1328 82     Resp 10/03/17 1328 18     Temp 10/03/17 1328 98.7 F (37.1 C)     Temp Source 10/03/17 1328 Oral     SpO2 10/03/17 1328 99 %     Weight 10/03/17 1329 170 lb (77.1 kg)     Height 10/03/17 1329 5\' 10"  (1.778 m)     Head Circumference --      Peak Flow --      Pain Score 10/03/17 1329 10     Pain Loc --      Pain Edu? --      Excl. in GC? --    Constitutional: Alert and oriented. Well appearing and in no acute distress. Eyes: Conjunctivae are normal. PERRL. EOMI. Head: Atraumatic. Nose: No congestion/rhinnorhea. Mouth/Throat: Mucous membranes are moist.  Oropharynx non-erythematous. Neck: No stridor.   Cardiovascular: Normal rate, regular rhythm. Grossly normal heart sounds.  Good peripheral circulation. Respiratory: Normal respiratory effort.  No retractions. Lungs CTAB. Gastrointestinal: Soft and nontender. No distention. No abdominal bruits. No CVA tenderness. Musculoskeletal: No obvious deformity or edema to the bilateral knee.  No crepitus with palpation.  No joint effusions. Neurologic:  Normal speech and language. No gross focal neurologic deficits are appreciated. No gait instability. Skin:  Skin is warm, dry and intact. No rash noted. Psychiatric: Mood and affect are normal. Speech and behavior are normal.  ____________________________________________   LABS (all labs ordered are listed, but only abnormal results are displayed)  Labs Reviewed  BASIC METABOLIC PANEL - Abnormal; Notable for the following components:      Result Value   Glucose, Bld 130 (*)    All other components within normal limits  CBC WITH DIFFERENTIAL/PLATELET - Abnormal; Notable for the following components:   WBC 11.2 (*)    Neutro Abs 8.4 (*)    All other components within normal limits  URIC ACID  SEDIMENTATION RATE    ____________________________________________  EKG   ____________________________________________  RADIOLOGY  ED MD interpretation: No acute findings x-ray of the right knee Official radiology report(s): Dg Knee 2 Views Right  Result Date: 10/03/2017 CLINICAL DATA:  Chronic pain EXAM: RIGHT KNEE - 1-2 VIEW COMPARISON:  None. FINDINGS: Frontal and lateral views were obtained. No evident fracture or dislocation. No appreciable joint effusion. Joint spaces appear normal. No erosive change. IMPRESSION: No fracture or joint effusion.  No appreciable arthropathy. Electronically Signed   By: Bretta Bang III M.D.   On: 10/03/2017 14:54    ____________________________________________   PROCEDURES  Procedure(s) performed: None  Procedures  Critical Care performed: No  ____________________________________________   INITIAL IMPRESSION / ASSESSMENT AND PLAN / ED COURSE  As part of my medical decision making, I reviewed the following data within the electronic MEDICAL RECORD NUMBER    Arthralgia.  Discussed negative x-ray report of the right knee.  Discussed negative findings on patient labs for inflammatory process.  Patient  given discharge care instruction advised [facility].      ____________________________________________   FINAL CLINICAL IMPRESSION(S) / ED DIAGNOSES  Final diagnoses:  Arthralgia of both knees  Arthralgia of both hands     ED Discharge Orders        Ordered    naproxen (NAPROSYN) 500 MG tablet  2 times daily with meals     10/03/17 1614       Note:  This document was prepared using Dragon voice recognition software and may include unintentional dictation errors.    Joni ReiningSmith, Angelyn Osterberg K, PA-C 10/03/17 1617    Governor RooksLord, Rebecca, MD 10/06/17 1135

## 2017-10-03 NOTE — ED Triage Notes (Signed)
Bilateral knee pain x 1 month, denies injury.

## 2017-10-03 NOTE — ED Notes (Signed)

## 2017-10-19 ENCOUNTER — Other Ambulatory Visit: Payer: Self-pay

## 2017-10-19 ENCOUNTER — Emergency Department
Admission: EM | Admit: 2017-10-19 | Discharge: 2017-10-19 | Disposition: A | Discharge status: Home / Self Care | Payer: Self-pay | Attending: Emergency Medicine | Admitting: Emergency Medicine

## 2017-10-19 ENCOUNTER — Encounter: Payer: Self-pay | Admitting: Emergency Medicine

## 2017-10-19 DIAGNOSIS — F17210 Nicotine dependence, cigarettes, uncomplicated: Secondary | ICD-10-CM | POA: Insufficient documentation

## 2017-10-19 DIAGNOSIS — K047 Periapical abscess without sinus: Secondary | ICD-10-CM | POA: Insufficient documentation

## 2017-10-19 DIAGNOSIS — Z79899 Other long term (current) drug therapy: Secondary | ICD-10-CM | POA: Insufficient documentation

## 2017-10-19 DIAGNOSIS — J45909 Unspecified asthma, uncomplicated: Secondary | ICD-10-CM | POA: Insufficient documentation

## 2017-10-19 MED ORDER — AMOXICILLIN 875 MG PO TABS: 875.0000 mg | ORAL_TABLET | Freq: Two times a day (BID) | ORAL | 0 refills | Status: DC

## 2017-10-19 MED ORDER — LIDOCAINE-EPINEPHRINE 2 %-1:100000 IJ SOLN
1.7000 mL | Freq: Once | INTRAMUSCULAR | Status: AC
  Administered 2017-10-19: 1.7 mL
  Filled 2017-10-19: qty 1.7

## 2017-10-19 MED ORDER — MAGIC MOUTHWASH W/LIDOCAINE: 5.0000 mL | Freq: Four times a day (QID) | ORAL | 0 refills | Status: DC

## 2017-10-19 NOTE — Discharge Instructions (Signed)
OPTIONS FOR DENTAL FOLLOW UP CARE ° °[place] [facility] and [facility] - Local Safety Net Dental Clinics °http://www.ncdhhs.gov/dph/oralhealth/services/safetynetclinics.htm °  °[facility] (336-562-3123) ° °[facility] [place] (919-933-9087) ° °[facility] ([phone] ext [phone]) ° ° [facility] ([phone]) ° °[facility] ([phone]) °This clinic caters to the indigent population and is on a lottery system. °Location: °[facility], [address], [place] °Clinic Hours: °Wednesdays from 6pm - 9pm, patients seen by a lottery system. °For dates, call or go to [email or web address].[facility].edu/[facility]/patients/Dental-[facility] °Services: °Cleanings, fillings and simple extractions. °Payment Options: °DENTAL WORK IS FREE OF CHARGE. Bring proof of income or support. °Best way to get seen: °Arrive at 5:15 pm - this is a lottery, NOT first come/first serve, so arriving earlier will not increase your chances of being seen. °  °  °[facility] °[phone] °Select option 1 for emergencies °  °Location: °[facility], [address], [place] °Clinic Hours: °No walk-ins accepted - call the day before to schedule an appointment. °Check in times are 9:30 am and 1:30 pm. °Services: °Simple extractions, temporary fillings, pulpectomy/pulp debridement, uncomplicated abscess drainage. °Payment Options: °PAYMENT IS DUE AT THE TIME OF SERVICE.  Fee is usually $100-200, additional surgical procedures (e.g. abscess drainage) may be extra. °Cash, checks, Visa/MasterCard accepted.  Can file Medicaid if patient is covered for dental - patient should call case worker to check. °No discount for [facility] patients. °Best way to get seen: °MUST call the day before and get onto the schedule. Can usually be seen the next 1-2 days. No walk-ins accepted. °  °  °[facility] °[phone] °   °Location: °[facility], [address], [place] °Clinic Hours: °M, W, Th, F 8am or 1:30pm, Tues 9a or 1:30 - first come/first served. °Services: °Simple extractions, temporary fillings, uncomplicated abscess drainage.  You do not need to be an [place] resident. °Payment Options: °PAYMENT IS DUE AT THE TIME OF SERVICE. °Dental insurance, otherwise sliding scale - bring proof of income or support. °Depending on income and treatment needed, cost is usually $50-200. °Best way to get seen: °Arrive early as it is first come/first served. °  °  °[facility] °[phone] °  °Location: °[address] °Clinic Hours: °Mon-Thu 8a-5p °Services: °Most basic dental services including extractions and fillings. °Payment Options: °PAYMENT IS DUE AT THE TIME OF SERVICE. °Sliding scale, up to 50% off - bring proof if income or support. °Medicaid with dental option accepted. °Best way to get seen: °Call to schedule an appointment, can usually be seen within 2 weeks OR they will try to see walk-ins - show up at 8a or 2p (you may have to wait). °  °  °[facility] °919-245-2435 °[place] RESIDENTS ONLY °  °Location: °[facility], [address], [place], [place] [address] °Clinic Hours: By appointment only. °Monday - Thursday 8am-5pm, Friday 8am-12pm °Services: Cleanings, fillings, extractions. °Payment Options: °PAYMENT IS DUE AT THE TIME OF SERVICE. °Cash, Visa or MasterCard. Sliding scale - $30 minimum per service. °Best way to get seen: °Come in to office, complete packet and make an appointment - need proof of income °or support monies for each household member and proof of [place] residence. °Usually takes about a month to get in. °  °  °[facility] °[phone] °  °Location: °[address]   [place] °Clinic Hours: Walk-in Urgent Care Dental Services are offered Monday-Friday  mornings only. °The numbers of emergencies accepted daily is limited to the number of °providers available. °Maximum 15 - Mondays, Wednesdays & Thursdays °Maximum 10 - Tuesdays & Fridays °Services: °You do not need to be a [place] resident to be seen for a dental emergency. °Emergencies are defined as pain, swelling, abnormal bleeding, or dental trauma. Walkins will receive x-rays if needed. °NOTE: Dental cleaning is not an emergency. °Payment Options: °PAYMENT IS DUE AT THE TIME OF SERVICE. °Minimum co-pay is $40.00 for uninsured patients. °Minimum co-pay is $3.00 for Medicaid with dental coverage. °Dental Insurance is accepted and must be presented at time of visit. °Medicare does not cover dental. °Forms of payment: Cash, credit card, checks. °Best way to get seen: °If not previously registered with the clinic, walk-in dental registration begins at 7:15 am and is on a first come/first serve basis. °If previously registered with the clinic, call to make an appointment. °  °  °[facility] °[phone] °[place] RESIDENTS ONLY °  °Location: °[address], [place], [place] °Clinic Hours: °Mon-Thu 10a-2p °Services: Extractions only! °Payment Options: °FREE (donations accepted) - bring proof of income or support °Best way to get seen: °Call and schedule an appointment OR come at 8am on the 1st Monday of every month (except for holidays) when it is first come/first served. °  °  °[facility] °[phone] °  °Location: °[address], [place] °Clinic Hours: °Friday mornings °Services, Payment Options, Best way to get seen: °Call for info °

## 2017-10-19 NOTE — ED Provider Notes (Signed)
[facility] Emergency Department Provider Note  ____________________________________________  Time seen: Approximately 3:29 PM  I have reviewed the triage vital signs and the nursing notes.   HISTORY  Chief Complaint Dental Pain    HPI Joshua [name] is a 27 y.o. male who presents the emergency department complaining of right lower dental pain.  Patient reports that he has a patient does not have a dentist and has not seen anybody for same.  Patient reports that pain began yesterday but drastically increased this morning.  Patient denies any gross swelling.  He denies any fevers or chills, difficulty breathing or swallowing, chest pain, shortness of breath, abdominal pain, nausea or vomiting.  Patient is tried Motrin at home.  No other medications.  No other complaints at this time.  Past Medical History:  Diagnosis Date  . Asthma   . Bronchitis   . Hernia cerebri (HCC)     There are no active problems to display for this patient.   Past Surgical History:  Procedure Laterality Date  . ankle/foot surgery with metal plates      Prior to Admission medications   Medication Sig Start Date End Date Taking? Authorizing Provider  albuterol (PROVENTIL HFA;VENTOLIN HFA) 108 (90 BASE) MCG/ACT inhaler Inhale 2 puffs into the lungs every 6 (six) hours as needed for wheezing or shortness of breath. 05/21/15   Beers, Charmayne Sheer, PA-C  amoxicillin (AMOXIL) 875 MG tablet Take 1 tablet (875 mg total) by mouth 2 (two) times daily. 10/19/17   Cuthriell, Delorise Royals, PA-C  ciprofloxacin (CIPRO) 500 MG tablet Take 1 tablet (500 mg total) by mouth 2 (two) times daily. 09/29/16   Sharyn Creamer, MD  dicyclomine (BENTYL) 20 MG tablet Take 1 tablet (20 mg total) by mouth 3 (three) times daily as needed (abdominal pain). 05/28/16   Phineas Semen, MD  gabapentin (NEURONTIN) 300 MG capsule Take 1 capsule (300 mg total) by mouth 3 (three) times daily. 02/28/16   Lenn Sink, DPM   HYDROcodone-acetaminophen (NORCO/VICODIN) 5-325 MG tablet Take 1 tablet by mouth every 6 (six) hours as needed for moderate pain. 09/29/16   Sharyn Creamer, MD  ibuprofen (ADVIL,MOTRIN) 600 MG tablet Take 1 tablet (600 mg total) by mouth every 8 (eight) hours as needed. 11/14/16   Joni Reining, PA-C  ketorolac (TORADOL) 10 MG tablet Take 1 tablet (10 mg total) by mouth every 6 (six) hours as needed. 03/15/17   Darci Current, MD  magic mouthwash w/lidocaine SOLN Take 5 mLs by mouth 4 (four) times daily. 10/19/17   Cuthriell, Delorise Royals, PA-C  metroNIDAZOLE (FLAGYL) 500 MG tablet Take 1 tablet (500 mg total) by mouth 3 (three) times daily. 09/29/16   Sharyn Creamer, MD  naproxen (NAPROSYN) 500 MG tablet Take 1 tablet (500 mg total) by mouth 2 (two) times daily with a meal. 10/03/17   Joni Reining, PA-C  neomycin-polymyxin-pramoxine (NEOSPORIN PLUS) 1 % cream Apply topically 2 (two) times daily. 11/14/16   Joni Reining, PA-C  ondansetron (ZOFRAN ODT) 4 MG disintegrating tablet Take 1 tablet (4 mg total) by mouth every 6 (six) hours as needed for nausea or vomiting. 09/29/16   Sharyn Creamer, MD  oxyCODONE-acetaminophen (ROXICET) 5-325 MG tablet Take 1 tablet by mouth every 6 (six) hours as needed for moderate pain. 11/14/16   Joni Reining, PA-C  sulfamethoxazole-trimethoprim (BACTRIM DS,SEPTRA DS) 800-160 MG tablet Take 1 tablet by mouth 2 (two) times daily. 11/14/16   Joni Reining, PA-C  Allergies Patient has no known allergies.  No family history on file.  Social History Social History   Tobacco Use  . Smoking status: Current Every Day Smoker    Packs/day: 0.50    Types: Cigarettes  . Smokeless tobacco: Never Used  Substance Use Topics  . Alcohol use: No  . Drug use: No     Review of Systems  Constitutional: No fever/chills Eyes: No visual changes. No discharge ENT: Positive for right lower dental pain Cardiovascular: no chest pain. Respiratory: no cough. No SOB. Gastrointestinal:  No abdominal pain.  No nausea, no vomiting.   Musculoskeletal: Negative for musculoskeletal pain. Skin: Negative for rash, abrasions, lacerations, ecchymosis. Neurological: Negative for headaches, focal weakness or numbness. 10-point ROS otherwise negative.  ____________________________________________   PHYSICAL EXAM:  VITAL SIGNS: ED Triage Vitals  Enc Vitals Group     BP 10/19/17 1323 124/70     Pulse Rate 10/19/17 1323 61     Resp 10/19/17 1323 16     Temp 10/19/17 1323 97.8 F (36.6 C)     Temp Source 10/19/17 1323 Oral     SpO2 10/19/17 1323 100 %     Weight 10/19/17 1323 165 lb (74.8 kg)     Height 10/19/17 1323 5\' 10"  (1.778 m)     Head Circumference --      Peak Flow --      Pain Score 10/19/17 1333 10     Pain Loc --      Pain Edu? --      Excl. in GC? --      Constitutional: Alert and oriented. Well appearing and in no acute distress. Eyes: Conjunctivae are normal. PERRL. EOMI. Head: Atraumatic. ENT:      Ears:       Nose: No congestion/rhinnorhea.      Mouth/Throat: Mucous membranes are moist.  Overall patient with good dentition.  Patient's #32 tooth is eroded into the gumline.  Surrounding erythema and edema.  No appreciable abscess on visualization or palpation with tongue depressor.  No pustular drainage.  Uvula is midline.  No evidence of deep space infection/Ludwig's angina. Neck: No stridor. Hematological/Lymphatic/Immunilogical: No cervical lymphadenopathy. Cardiovascular: Normal rate, regular rhythm. Normal S1 and S2.  Good peripheral circulation. Respiratory: Normal respiratory effort without tachypnea or retractions. Lungs CTAB. Good air entry to the bases with no decreased or absent breath sounds. Musculoskeletal: Full range of motion to all extremities. No gross deformities appreciated. Neurologic:  Normal speech and language. No gross focal neurologic deficits are appreciated.  Skin:  Skin is warm, dry and intact. No rash noted. Psychiatric:  Mood and affect are normal. Speech and behavior are normal. Patient exhibits appropriate insight and judgement.   ____________________________________________   LABS (all labs ordered are listed, but only abnormal results are displayed)  Labs Reviewed - No data to display ____________________________________________  EKG   ____________________________________________  RADIOLOGY   No results found.  ____________________________________________    PROCEDURES  Procedure(s) performed:    .Nerve Block Date/Time: 10/19/2017 3:38 PM Performed by: Racheal Patches, PA-C Authorized by: Racheal Patches, PA-C   Consent:    Consent obtained:  Verbal   Consent given by:  Patient   Risks discussed:  Allergic reaction, swelling, unsuccessful block and pain Indications:    Indications:  Pain relief Location:    Body area:  Head   Head nerve blocked: inferior alveolar nerve.   Laterality:  Right Procedure details (see MAR for exact dosages):  Block needle gauge:  27 G   Anesthetic injected:  Lidocaine 1% WITH epi   Steroid injected:  None   Additive injected:  None   Injection procedure:  Anatomic landmarks identified, introduced needle, negative aspiration for blood and incremental injection   Paresthesia:  None Post-procedure details:    Dressing:  None   Outcome:  Pain improved   Patient tolerance of procedure:  Tolerated well, no immediate complications      Medications  lidocaine-EPINEPHrine (XYLOCAINE W/EPI) 2 %-1:100000 (with pres) injection 1.7 mL (not administered)     ____________________________________________   INITIAL IMPRESSION / ASSESSMENT AND PLAN / ED COURSE  Pertinent labs & imaging results that were available during my care of the patient were reviewed by me and considered in my medical decision making (see chart for details).  Review of the [place] CSRS was performed in accordance of the [facility] prior to dispensing any controlled  drugs.     Patient's diagnosis is consistent with dental infection.  Patient presents emergency department complaining of right lower dental pain.  Differential included dental abscess, dental infection, dental caries, broken or loose dentition.  On exam, consistent with dental infection to a tooth that is eroded to the gumline.  Patient is given dental block as described above.  Patient tolerated well with no complications.  Patient states that he will follow-up with a dentist after his antibiotics have finished for extraction of this tooth.. Patient will be discharged home with prescriptions for amoxicillin and Magic mouthwash. Patient is to follow up with dentist as needed or otherwise directed. Patient is given ED precautions to return to the ED for any worsening or new symptoms.     ____________________________________________  FINAL CLINICAL IMPRESSION(S) / ED DIAGNOSES  Final diagnoses:  Dental infection      NEW MEDICATIONS STARTED DURING THIS VISIT:  ED Discharge Orders        Ordered    amoxicillin (AMOXIL) 875 MG tablet  2 times daily     10/19/17 1537    magic mouthwash w/lidocaine SOLN  4 times daily    Comments:  Dispense in a 1/1/1/1 ratio. Use lidocaine, diphenhydramine, prednisolone, nystatin   10/19/17 1537          This chart was dictated using voice recognition software/Dragon. Despite best efforts to proofread, errors can occur which can change the meaning. Any change was purely unintentional.    Racheal PatchesCuthriell, Jonathan D, PA-C 10/19/17 1551    Pershing ProudSchaevitz, Myra Rudeavid Matthew, MD 10/19/17 [phone]

## 2017-10-19 NOTE — ED Notes (Signed)
Pt reports that he "broke my wisdom tooth on the right side".  Pt reports that the pain is unbearable.

## 2017-10-19 NOTE — ED Triage Notes (Signed)
Dental pain R lower began today.

## 2017-10-23 DIAGNOSIS — I4901 Ventricular fibrillation: Secondary | ICD-10-CM

## 2017-10-23 DIAGNOSIS — I469 Cardiac arrest, cause unspecified: Secondary | ICD-10-CM

## 2017-10-23 DIAGNOSIS — I96 Gangrene, not elsewhere classified: Secondary | ICD-10-CM

## 2017-10-23 HISTORY — DX: Ventricular fibrillation: I49.01

## 2017-10-23 HISTORY — DX: Cardiac arrest, cause unspecified: I46.9

## 2017-10-23 HISTORY — DX: Gangrene, not elsewhere classified: I96

## 2017-10-30 ENCOUNTER — Emergency Department: Payer: Self-pay

## 2017-10-30 ENCOUNTER — Inpatient Hospital Stay: Payer: Self-pay

## 2017-10-30 ENCOUNTER — Inpatient Hospital Stay
Admission: EM | Admit: 2017-10-30 | Discharge: 2017-11-27 | DRG: 907 | Disposition: A | Discharge status: Home Health | Payer: Self-pay | Attending: Internal Medicine | Admitting: Internal Medicine

## 2017-10-30 DIAGNOSIS — R402432 Glasgow coma scale score 3-8, at arrival to emergency department: Secondary | ICD-10-CM | POA: Diagnosis present

## 2017-10-30 DIAGNOSIS — I998 Other disorder of circulatory system: Secondary | ICD-10-CM | POA: Diagnosis present

## 2017-10-30 DIAGNOSIS — N5089 Other specified disorders of the male genital organs: Secondary | ICD-10-CM | POA: Diagnosis not present

## 2017-10-30 DIAGNOSIS — Z9911 Dependence on respirator [ventilator] status: Secondary | ICD-10-CM

## 2017-10-30 DIAGNOSIS — W19XXXA Unspecified fall, initial encounter: Secondary | ICD-10-CM | POA: Diagnosis not present

## 2017-10-30 DIAGNOSIS — M6282 Rhabdomyolysis: Secondary | ICD-10-CM

## 2017-10-30 DIAGNOSIS — R0602 Shortness of breath: Secondary | ICD-10-CM

## 2017-10-30 DIAGNOSIS — R570 Cardiogenic shock: Secondary | ICD-10-CM

## 2017-10-30 DIAGNOSIS — I96 Gangrene, not elsewhere classified: Secondary | ICD-10-CM | POA: Diagnosis not present

## 2017-10-30 DIAGNOSIS — Z9189 Other specified personal risk factors, not elsewhere classified: Secondary | ICD-10-CM

## 2017-10-30 DIAGNOSIS — F17210 Nicotine dependence, cigarettes, uncomplicated: Secondary | ICD-10-CM | POA: Diagnosis present

## 2017-10-30 DIAGNOSIS — I110 Hypertensive heart disease with heart failure: Secondary | ICD-10-CM | POA: Diagnosis present

## 2017-10-30 DIAGNOSIS — E872 Acidosis: Secondary | ICD-10-CM | POA: Diagnosis present

## 2017-10-30 DIAGNOSIS — J45909 Unspecified asthma, uncomplicated: Secondary | ICD-10-CM | POA: Diagnosis present

## 2017-10-30 DIAGNOSIS — T405X1A Poisoning by cocaine, accidental (unintentional), initial encounter: Principal | ICD-10-CM | POA: Diagnosis present

## 2017-10-30 DIAGNOSIS — F1910 Other psychoactive substance abuse, uncomplicated: Secondary | ICD-10-CM

## 2017-10-30 DIAGNOSIS — J9601 Acute respiratory failure with hypoxia: Secondary | ICD-10-CM

## 2017-10-30 DIAGNOSIS — F14129 Cocaine abuse with intoxication, unspecified: Secondary | ICD-10-CM | POA: Diagnosis present

## 2017-10-30 DIAGNOSIS — E46 Unspecified protein-calorie malnutrition: Secondary | ICD-10-CM | POA: Diagnosis not present

## 2017-10-30 DIAGNOSIS — J9600 Acute respiratory failure, unspecified whether with hypoxia or hypercapnia: Secondary | ICD-10-CM

## 2017-10-30 DIAGNOSIS — T68XXXA Hypothermia, initial encounter: Secondary | ICD-10-CM

## 2017-10-30 DIAGNOSIS — R45851 Suicidal ideations: Secondary | ICD-10-CM | POA: Diagnosis not present

## 2017-10-30 DIAGNOSIS — E876 Hypokalemia: Secondary | ICD-10-CM | POA: Diagnosis present

## 2017-10-30 DIAGNOSIS — A419 Sepsis, unspecified organism: Secondary | ICD-10-CM | POA: Diagnosis present

## 2017-10-30 DIAGNOSIS — J690 Pneumonitis due to inhalation of food and vomit: Secondary | ICD-10-CM | POA: Diagnosis present

## 2017-10-30 DIAGNOSIS — I5021 Acute systolic (congestive) heart failure: Secondary | ICD-10-CM | POA: Diagnosis present

## 2017-10-30 DIAGNOSIS — E870 Hyperosmolality and hypernatremia: Secondary | ICD-10-CM | POA: Diagnosis present

## 2017-10-30 DIAGNOSIS — M79A21 Nontraumatic compartment syndrome of right lower extremity: Secondary | ICD-10-CM | POA: Diagnosis present

## 2017-10-30 DIAGNOSIS — Y92230 Patient room in hospital as the place of occurrence of the external cause: Secondary | ICD-10-CM | POA: Diagnosis not present

## 2017-10-30 DIAGNOSIS — M549 Dorsalgia, unspecified: Secondary | ICD-10-CM | POA: Diagnosis not present

## 2017-10-30 DIAGNOSIS — D631 Anemia in chronic kidney disease: Secondary | ICD-10-CM | POA: Diagnosis not present

## 2017-10-30 DIAGNOSIS — N170 Acute kidney failure with tubular necrosis: Secondary | ICD-10-CM | POA: Diagnosis present

## 2017-10-30 DIAGNOSIS — R6521 Severe sepsis with septic shock: Secondary | ICD-10-CM | POA: Diagnosis not present

## 2017-10-30 DIAGNOSIS — Y9259 Other trade areas as the place of occurrence of the external cause: Secondary | ICD-10-CM

## 2017-10-30 DIAGNOSIS — Z888 Allergy status to other drugs, medicaments and biological substances status: Secondary | ICD-10-CM

## 2017-10-30 DIAGNOSIS — G894 Chronic pain syndrome: Secondary | ICD-10-CM | POA: Diagnosis not present

## 2017-10-30 DIAGNOSIS — G92 Toxic encephalopathy: Secondary | ICD-10-CM | POA: Diagnosis present

## 2017-10-30 DIAGNOSIS — I4901 Ventricular fibrillation: Secondary | ICD-10-CM | POA: Diagnosis present

## 2017-10-30 DIAGNOSIS — E875 Hyperkalemia: Secondary | ICD-10-CM | POA: Diagnosis present

## 2017-10-30 DIAGNOSIS — I248 Other forms of acute ischemic heart disease: Secondary | ICD-10-CM | POA: Diagnosis present

## 2017-10-30 DIAGNOSIS — I10 Essential (primary) hypertension: Secondary | ICD-10-CM | POA: Diagnosis present

## 2017-10-30 DIAGNOSIS — I469 Cardiac arrest, cause unspecified: Secondary | ICD-10-CM

## 2017-10-30 DIAGNOSIS — T40601A Poisoning by unspecified narcotics, accidental (unintentional), initial encounter: Secondary | ICD-10-CM | POA: Diagnosis present

## 2017-10-30 DIAGNOSIS — F4323 Adjustment disorder with mixed anxiety and depressed mood: Secondary | ICD-10-CM | POA: Diagnosis present

## 2017-10-30 DIAGNOSIS — D65 Disseminated intravascular coagulation [defibrination syndrome]: Secondary | ICD-10-CM | POA: Diagnosis not present

## 2017-10-30 DIAGNOSIS — N179 Acute kidney failure, unspecified: Secondary | ICD-10-CM

## 2017-10-30 DIAGNOSIS — I429 Cardiomyopathy, unspecified: Secondary | ICD-10-CM | POA: Diagnosis present

## 2017-10-30 DIAGNOSIS — Z452 Encounter for adjustment and management of vascular access device: Secondary | ICD-10-CM

## 2017-10-30 DIAGNOSIS — T50904A Poisoning by unspecified drugs, medicaments and biological substances, undetermined, initial encounter: Secondary | ICD-10-CM

## 2017-10-30 DIAGNOSIS — D62 Acute posthemorrhagic anemia: Secondary | ICD-10-CM | POA: Diagnosis not present

## 2017-10-30 LAB — ACETAMINOPHEN LEVEL: Acetaminophen (Tylenol), Serum: <10 ug/mL — ABNORMAL LOW (ref 10–30)

## 2017-10-30 LAB — CBC WITH DIFFERENTIAL/PLATELET
BASOS PCT: 0 %
BASOS PCT: 0 %
Basophils Absolute: 0.2 10*3/uL — ABNORMAL HIGH (ref 0–0.1)
Basophils Absolute: 0.1 10*3/uL (ref 0–0.1)
EOS ABS: 0.2 10*3/uL (ref 0–0.7)
EOS ABS: 0 10*3/uL (ref 0–0.7)
Eosinophils Relative: 1 %
Eosinophils Relative: 0 %
HCT: 50.9 % (ref 40.0–52.0)
HEMATOCRIT: 55.7 % — AB (ref 40.0–52.0)
HEMOGLOBIN: 16.3 g/dL (ref 13.0–18.0)
Hemoglobin: 17.1 g/dL (ref 13.0–18.0)
LYMPHS ABS: 7.1 10*3/uL — AB (ref 1.0–3.6)
Lymphocytes Relative: 14 %
Lymphocytes Relative: 4 %
Lymphs Abs: 1.7 10*3/uL (ref 1.0–3.6)
MCH: 30.9 pg (ref 26.0–34.0)
MCH: 30.5 pg (ref 26.0–34.0)
MCHC: 30.8 g/dL — AB (ref 32.0–36.0)
MCHC: 32 g/dL (ref 32.0–36.0)
MCV: 100.3 fL — ABNORMAL HIGH (ref 80.0–100.0)
MCV: 95.4 fL (ref 80.0–100.0)
MONO ABS: 2.9 10*3/uL — AB (ref 0.2–1.0)
MONOS PCT: 6 %
MONOS PCT: 2 %
Monocytes Absolute: 0.6 10*3/uL (ref 0.2–1.0)
NEUTROS PCT: 94 %
Neutro Abs: 39 10*3/uL — ABNORMAL HIGH (ref 1.4–6.5)
Neutro Abs: 37 10*3/uL — ABNORMAL HIGH (ref 1.4–6.5)
Neutrophils Relative %: 79 %
Platelets: 282 10*3/uL (ref 150–440)
Platelets: 214 10*3/uL (ref 150–440)
RBC: 5.55 MIL/uL (ref 4.40–5.90)
RBC: 5.33 MIL/uL (ref 4.40–5.90)
RDW: 13.2 % (ref 11.5–14.5)
RDW: 12.2 % (ref 11.5–14.5)
WBC: 49.5 10*3/uL — ABNORMAL HIGH (ref 3.8–10.6)
WBC: 39.5 10*3/uL — ABNORMAL HIGH (ref 3.8–10.6)

## 2017-10-30 LAB — BLOOD GAS, ARTERIAL
ACID-BASE DEFICIT: 14.1 mmol/L — AB (ref 0.0–2.0)
Acid-base deficit: 6.4 mmol/L — ABNORMAL HIGH (ref 0.0–2.0)
BICARBONATE: 16.6 mmol/L — AB (ref 20.0–28.0)
BICARBONATE: 22.4 mmol/L (ref 20.0–28.0)
FIO2: 0.8
FIO2: 0.8
LHR: 25 {breaths}/min
LHR: 30 {breaths}/min
MECHVT: 500 mL
O2 SAT: 85 %
O2 SAT: 92.5 %
PATIENT TEMPERATURE: 36.4
PATIENT TEMPERATURE: 36.2
PCO2 ART: 56 mmHg — AB (ref 32.0–48.0)
PEEP/CPAP: 5 cmH2O
PEEP/CPAP: 5 cmH2O
PH ART: 7.09 — AB (ref 7.350–7.450)
PO2 ART: 67 mmHg — AB (ref 83.0–108.0)
PO2 ART: 75 mmHg — AB (ref 83.0–108.0)
VT: 500 mL
pCO2 arterial: 56 mmHg — ABNORMAL HIGH (ref 32.0–48.0)
pH, Arterial: 7.22 — ABNORMAL LOW (ref 7.350–7.450)

## 2017-10-30 LAB — URINALYSIS, COMPLETE (UACMP) WITH MICROSCOPIC
BACTERIA UA: NONE SEEN
Bilirubin Urine: NEGATIVE
GLUCOSE, UA: NEGATIVE mg/dL
Ketones, ur: NEGATIVE mg/dL
Leukocytes, UA: NEGATIVE
Nitrite: NEGATIVE
PROTEIN: 30 mg/dL — AB
Specific Gravity, Urine: 1.023 (ref 1.005–1.030)
pH: 5 (ref 5.0–8.0)

## 2017-10-30 LAB — CK
Total CK: 7162 U/L — ABNORMAL HIGH (ref 49–397)
Total CK: >50000 U/L — ABNORMAL HIGH (ref 49–397)

## 2017-10-30 LAB — COMPREHENSIVE METABOLIC PANEL
ALK PHOS: 84 U/L (ref 38–126)
ALT: 233 U/L — ABNORMAL HIGH (ref 17–63)
ALT: 474 U/L — ABNORMAL HIGH (ref 17–63)
ANION GAP: 27 — AB (ref 5–15)
ANION GAP: 22 — AB (ref 5–15)
AST: 316 U/L — ABNORMAL HIGH (ref 15–41)
AST: 1027 U/L — AB (ref 15–41)
Albumin: 4.9 g/dL (ref 3.5–5.0)
Albumin: 2.2 g/dL — ABNORMAL LOW (ref 3.5–5.0)
Alkaline Phosphatase: 44 U/L (ref 38–126)
BILIRUBIN TOTAL: 1.8 mg/dL — AB (ref 0.3–1.2)
BUN: 19 mg/dL (ref 6–20)
BUN: 29 mg/dL — AB (ref 6–20)
CALCIUM: 9.6 mg/dL (ref 8.9–10.3)
CHLORIDE: 101 mmol/L (ref 101–111)
CO2: 16 mmol/L — ABNORMAL LOW (ref 22–32)
CO2: 27 mmol/L (ref 22–32)
Calcium: 6 mg/dL — CL (ref 8.9–10.3)
Chloride: 101 mmol/L (ref 101–111)
Creatinine, Ser: 2.96 mg/dL — ABNORMAL HIGH (ref 0.61–1.24)
Creatinine, Ser: 2.74 mg/dL — ABNORMAL HIGH (ref 0.61–1.24)
GFR calc Af Amer: 32 mL/min — ABNORMAL LOW (ref >60)
GFR, EST AFRICAN AMERICAN: 35 mL/min — AB (ref >60)
GFR, EST NON AFRICAN AMERICAN: 27 mL/min — AB (ref >60)
GFR, EST NON AFRICAN AMERICAN: 30 mL/min — AB (ref >60)
Glucose, Bld: 34 mg/dL — CL (ref 65–99)
Glucose, Bld: 418 mg/dL — ABNORMAL HIGH (ref 65–99)
Potassium: 6.3 mmol/L (ref 3.5–5.1)
Potassium: 4.1 mmol/L (ref 3.5–5.1)
SODIUM: 144 mmol/L (ref 135–145)
Sodium: 150 mmol/L — ABNORMAL HIGH (ref 135–145)
TOTAL PROTEIN: 8.1 g/dL (ref 6.5–8.1)
TOTAL PROTEIN: 3.8 g/dL — AB (ref 6.5–8.1)
Total Bilirubin: 1.4 mg/dL — ABNORMAL HIGH (ref 0.3–1.2)

## 2017-10-30 LAB — URINE DRUG SCREEN, QUALITATIVE (ARMC ONLY)
AMPHETAMINES, UR SCREEN: NOT DETECTED
BENZODIAZEPINE, UR SCRN: NOT DETECTED
Barbiturates, Ur Screen: NOT DETECTED
Cannabinoid 50 Ng, Ur ~~LOC~~: NOT DETECTED
Cocaine Metabolite,Ur ~~LOC~~: POSITIVE — AB
MDMA (Ecstasy)Ur Screen: NOT DETECTED
METHADONE SCREEN, URINE: NOT DETECTED
Opiate, Ur Screen: POSITIVE — AB
Phencyclidine (PCP) Ur S: NOT DETECTED
TRICYCLIC, UR SCREEN: NOT DETECTED

## 2017-10-30 LAB — PROCALCITONIN: Procalcitonin: 2.97 ng/mL

## 2017-10-30 LAB — BASIC METABOLIC PANEL
ANION GAP: 26 — AB (ref 5–15)
BUN: 24 mg/dL — AB (ref 6–20)
CALCIUM: 7 mg/dL — AB (ref 8.9–10.3)
CO2: 19 mmol/L — ABNORMAL LOW (ref 22–32)
Chloride: 105 mmol/L (ref 101–111)
Creatinine, Ser: 2.63 mg/dL — ABNORMAL HIGH (ref 0.61–1.24)
GFR calc Af Amer: 36 mL/min — ABNORMAL LOW (ref >60)
GFR, EST NON AFRICAN AMERICAN: 31 mL/min — AB (ref >60)
GLUCOSE: 254 mg/dL — AB (ref 65–99)
Potassium: 4.5 mmol/L (ref 3.5–5.1)
Sodium: 150 mmol/L — ABNORMAL HIGH (ref 135–145)

## 2017-10-30 LAB — GLUCOSE, CAPILLARY
GLUCOSE-CAPILLARY: 155 mg/dL — AB (ref 65–99)
GLUCOSE-CAPILLARY: 168 mg/dL — AB (ref 65–99)
Glucose-Capillary: 275 mg/dL — ABNORMAL HIGH (ref 65–99)
Glucose-Capillary: 408 mg/dL — ABNORMAL HIGH (ref 65–99)
Glucose-Capillary: 393 mg/dL — ABNORMAL HIGH (ref 65–99)

## 2017-10-30 LAB — FIBRINOGEN: FIBRINOGEN: 63 mg/dL — AB (ref 210–475)

## 2017-10-30 LAB — PROTIME-INR
INR: 1.65
INR: 2.91
INR: 3.17
PROTHROMBIN TIME: 19.4 s — AB (ref 11.4–15.2)
PROTHROMBIN TIME: 30.2 s — AB (ref 11.4–15.2)
Prothrombin Time: 32.3 seconds — ABNORMAL HIGH (ref 11.4–15.2)

## 2017-10-30 LAB — ETHANOL

## 2017-10-30 LAB — MRSA PCR SCREENING: MRSA by PCR: NEGATIVE

## 2017-10-30 LAB — PHOSPHORUS: PHOSPHORUS: 16 mg/dL — AB (ref 2.5–4.6)

## 2017-10-30 LAB — AMMONIA: AMMONIA: 32 umol/L (ref 9–35)

## 2017-10-30 LAB — APTT: APTT: 45 s — AB (ref 24–36)

## 2017-10-30 LAB — FIBRIN DERIVATIVES D-DIMER (ARMC ONLY)

## 2017-10-30 LAB — TROPONIN I
TROPONIN I: 0.92 ng/mL — AB (ref <0.03)
TROPONIN I: 3.31 ng/mL — AB (ref <0.03)

## 2017-10-30 LAB — MAGNESIUM: Magnesium: 2.8 mg/dL — ABNORMAL HIGH (ref 1.7–2.4)

## 2017-10-30 LAB — SALICYLATE LEVEL

## 2017-10-30 LAB — LACTIC ACID, PLASMA: Lactic Acid, Venous: 12.3 mmol/L (ref 0.5–1.9)

## 2017-10-30 MED ORDER — DEXTROSE 50 % IV SOLN
25.0000 mL | INTRAVENOUS | Status: DC | PRN
  Administered 2017-10-31: 25 mL via INTRAVENOUS
  Filled 2017-10-30 (×2): qty 50

## 2017-10-30 MED ORDER — SODIUM CHLORIDE 0.9 % IV SOLN
3.0000 g | Freq: Four times a day (QID) | INTRAVENOUS | Status: DC
  Administered 2017-10-30 – 2017-11-06 (×27): 3 g via INTRAVENOUS
  Filled 2017-10-30 (×29): qty 3

## 2017-10-30 MED ORDER — ATROPINE SULFATE 1 MG/ML IJ SOLN
INTRAMUSCULAR | Status: AC | PRN
  Administered 2017-10-30 (×2): 1 mg via INTRAVENOUS

## 2017-10-30 MED ORDER — SODIUM BICARBONATE 8.4 % IV SOLN
INTRAVENOUS | Status: AC | PRN
  Administered 2017-10-30: 50 meq via INTRAVENOUS

## 2017-10-30 MED ORDER — NOREPINEPHRINE BITARTRATE 1 MG/ML IV SOLN
0.0000 ug/min | INTRAVENOUS | Status: DC
  Filled 2017-10-30: qty 16

## 2017-10-30 MED ORDER — VANCOMYCIN HCL 10 G IV SOLR
1250.0000 mg | INTRAVENOUS | Status: DC
  Filled 2017-10-30: qty 1250

## 2017-10-30 MED ORDER — SODIUM BICARBONATE 8.4 % IV SOLN
INTRAVENOUS | Status: DC
Start: 2017-10-30 — End: 2017-10-31
  Administered 2017-10-30 – 2017-10-31 (×2): via INTRAVENOUS
  Filled 2017-10-30 (×7): qty 150

## 2017-10-30 MED ORDER — SODIUM CHLORIDE 0.9 % IV SOLN
INTRAVENOUS | Status: DC
  Administered 2017-10-30: 23:00:00 via INTRAVENOUS

## 2017-10-30 MED ORDER — ORAL CARE MOUTH RINSE
15.0000 mL | OROMUCOSAL | Status: DC
  Administered 2017-10-30 – 2017-11-09 (×83): 15 mL via OROMUCOSAL

## 2017-10-30 MED ORDER — VANCOMYCIN HCL IN DEXTROSE 1-5 GM/200ML-% IV SOLN
1000.0000 mg | Freq: Once | INTRAVENOUS | Status: AC
  Administered 2017-10-30: 1000 mg via INTRAVENOUS
  Filled 2017-10-30: qty 200

## 2017-10-30 MED ORDER — SODIUM CHLORIDE 0.9 % IV SOLN
2.0000 g | Freq: Once | INTRAVENOUS | Status: AC
  Administered 2017-10-30: 2 g via INTRAVENOUS
  Filled 2017-10-30: qty 20

## 2017-10-30 MED ORDER — PIPERACILLIN-TAZOBACTAM 3.375 G IVPB 30 MIN
3.3750 g | Freq: Once | INTRAVENOUS | Status: AC
Start: 2017-10-30 — End: 2017-10-30
  Administered 2017-10-30: 3.375 g via INTRAVENOUS
  Filled 2017-10-30: qty 50

## 2017-10-30 MED ORDER — VECURONIUM BROMIDE 10 MG IV SOLR
10.0000 mg | Freq: Once | INTRAVENOUS | Status: AC
  Filled 2017-10-30: qty 10

## 2017-10-30 MED ORDER — HEPARIN SODIUM (PORCINE) 5000 UNIT/ML IJ SOLN: 5000.0000 [IU] | Freq: Three times a day (TID) | INTRAMUSCULAR | Status: DC

## 2017-10-30 MED ORDER — FENTANYL CITRATE (PF) 100 MCG/2ML IJ SOLN: 100.0000 ug | INTRAMUSCULAR | Status: DC | PRN

## 2017-10-30 MED ORDER — DOCUSATE SODIUM 100 MG PO CAPS: 100.0000 mg | ORAL_CAPSULE | Freq: Two times a day (BID) | ORAL | Status: DC | PRN

## 2017-10-30 MED ORDER — VECURONIUM BROMIDE 10 MG IV SOLR
10.0000 mg | Freq: Once | INTRAVENOUS | Status: AC
  Administered 2017-10-30: 10 mg via INTRAVENOUS
  Filled 2017-10-30: qty 10

## 2017-10-30 MED ORDER — PANTOPRAZOLE SODIUM 40 MG IV SOLR
40.0000 mg | Freq: Every day | INTRAVENOUS | Status: DC
  Administered 2017-10-30 – 2017-11-01 (×3): 40 mg via INTRAVENOUS
  Filled 2017-10-30 (×3): qty 40

## 2017-10-30 MED ORDER — VECURONIUM BROMIDE 10 MG IV SOLR
INTRAVENOUS | Status: AC | PRN
  Administered 2017-10-30: 10 mg via INTRAVENOUS

## 2017-10-30 MED ORDER — BISACODYL 10 MG RE SUPP
10.0000 mg | Freq: Every day | RECTAL | Status: DC | PRN
Start: 2017-10-30 — End: 2017-11-27

## 2017-10-30 MED ORDER — SODIUM BICARBONATE 8.4 % IV SOLN
200.0000 meq | INTRAVENOUS | Status: AC
  Administered 2017-10-30: 200 meq via INTRAVENOUS

## 2017-10-30 MED ORDER — FAMOTIDINE IN NACL 20-0.9 MG/50ML-% IV SOLN
20.0000 mg | INTRAVENOUS | Status: DC
  Administered 2017-10-30 – 2017-10-31 (×2): 20 mg via INTRAVENOUS
  Filled 2017-10-30 (×2): qty 50

## 2017-10-30 MED ORDER — EPINEPHRINE PF 1 MG/10ML IJ SOSY
PREFILLED_SYRINGE | INTRAMUSCULAR | Status: AC | PRN
  Administered 2017-10-30 (×4): 1 mg via INTRAVENOUS

## 2017-10-30 MED ORDER — MIDAZOLAM HCL 2 MG/2ML IJ SOLN
2.0000 mg | INTRAMUSCULAR | Status: AC | PRN
  Administered 2017-11-01 – 2017-11-03 (×3): 2 mg via INTRAVENOUS
  Filled 2017-10-30 (×5): qty 2

## 2017-10-30 MED ORDER — VECURONIUM BOLUS VIA INFUSION: 10.0000 mg | INTRAVENOUS | Status: DC | PRN

## 2017-10-30 MED ORDER — SODIUM CHLORIDE 0.9% FLUSH: 10.0000 mL | INTRAVENOUS | Status: DC | PRN

## 2017-10-30 MED ORDER — SODIUM CHLORIDE 0.9 % IV SOLN: INTRAVENOUS | Status: DC

## 2017-10-30 MED ORDER — NOREPINEPHRINE 4 MG/250ML-% IV SOLN
0.0000 ug/min | INTRAVENOUS | Status: DC
  Administered 2017-10-30: 5 ug/min via INTRAVENOUS
  Administered 2017-10-30: 20 ug/min via INTRAVENOUS

## 2017-10-30 MED ORDER — CALCIUM CHLORIDE 10 % IV SOLN
INTRAVENOUS | Status: AC | PRN
  Administered 2017-10-30: 1 g via INTRAVENOUS

## 2017-10-30 MED ORDER — SODIUM BICARBONATE 8.4 % IV SOLN
200.0000 meq | INTRAVENOUS | Status: AC
  Administered 2017-10-30: 200 meq via INTRAVENOUS
  Filled 2017-10-30: qty 50

## 2017-10-30 MED ORDER — CHLORHEXIDINE GLUCONATE 0.12% ORAL RINSE (MEDLINE KIT)
15.0000 mL | Freq: Two times a day (BID) | OROMUCOSAL | Status: DC
  Administered 2017-10-30 – 2017-11-09 (×19): 15 mL via OROMUCOSAL

## 2017-10-30 MED ORDER — INSULIN REGULAR HUMAN 100 UNIT/ML IJ SOLN
INTRAMUSCULAR | Status: DC
  Administered 2017-10-30: 3.3 [IU]/h via INTRAVENOUS
  Administered 2017-10-31: 23.1 [IU]/h via INTRAVENOUS
  Filled 2017-10-30 (×3): qty 1

## 2017-10-30 MED ORDER — VASOPRESSIN 20 UNIT/ML IV SOLN
0.0400 [IU]/min | INTRAVENOUS | Status: DC
  Administered 2017-10-30 – 2017-11-05 (×7): 0.04 [IU]/min via INTRAVENOUS
  Filled 2017-10-30 (×9): qty 2

## 2017-10-30 MED ORDER — NALOXONE HCL 2 MG/2ML IJ SOSY
8.0000 mg | PREFILLED_SYRINGE | Freq: Once | INTRAMUSCULAR | Status: AC
  Administered 2017-10-30: 16:00:00 via INTRAVENOUS

## 2017-10-30 MED ORDER — SODIUM CHLORIDE 0.9 % IV BOLUS (SEPSIS)
8000.0000 mL | Freq: Once | INTRAVENOUS | Status: AC
  Administered 2017-10-30: 8000 mL via INTRAVENOUS

## 2017-10-30 MED ORDER — FENTANYL 2500MCG IN NS 250ML (10MCG/ML) PREMIX INFUSION
25.0000 ug/h | INTRAVENOUS | Status: DC
  Administered 2017-10-30: 100 ug/h via INTRAVENOUS
  Administered 2017-10-31: 175 ug/h via INTRAVENOUS
  Administered 2017-10-31: 150 ug/h via INTRAVENOUS
  Administered 2017-11-01: 50 ug/h via INTRAVENOUS
  Administered 2017-11-02: 100 ug/h via INTRAVENOUS
  Administered 2017-11-03: 10 ug/h via INTRAVENOUS
  Administered 2017-11-03: 100 ug/h via INTRAVENOUS
  Administered 2017-11-04 (×2): 200 ug/h via INTRAVENOUS
  Administered 2017-11-05: 300 ug/h via INTRAVENOUS
  Administered 2017-11-05: 275 ug/h via INTRAVENOUS
  Administered 2017-11-05: 300 ug/h via INTRAVENOUS
  Administered 2017-11-06: 250 ug/h via INTRAVENOUS
  Administered 2017-11-06: 300 ug/h via INTRAVENOUS
  Administered 2017-11-06: 250 ug/h via INTRAVENOUS
  Administered 2017-11-07 – 2017-11-08 (×3): 350 ug/h via INTRAVENOUS
  Administered 2017-11-09: 300 ug/h via INTRAVENOUS
  Filled 2017-10-30 (×21): qty 250

## 2017-10-30 MED ORDER — DEXTROSE-NACL 5-0.45 % IV SOLN
INTRAVENOUS | Status: DC
  Administered 2017-10-31: 19:00:00 via INTRAVENOUS

## 2017-10-30 MED ORDER — DEXTROSE 5 % IV SOLN
INTRAVENOUS | Status: AC | PRN
  Administered 2017-10-30: 300 mg via INTRAVENOUS
  Administered 2017-10-30: 150 mg via INTRAVENOUS

## 2017-10-30 MED ORDER — SENNOSIDES 8.8 MG/5ML PO SYRP
5.0000 mL | ORAL_SOLUTION | Freq: Two times a day (BID) | ORAL | Status: DC | PRN
  Filled 2017-10-30: qty 5

## 2017-10-30 MED ORDER — NALOXONE HCL 2 MG/2ML IJ SOSY
PREFILLED_SYRINGE | INTRAMUSCULAR | Status: AC | PRN
  Administered 2017-10-30 (×3): 2 mg via INTRAVENOUS

## 2017-10-30 MED ORDER — INSULIN REGULAR HUMAN 100 UNIT/ML IJ SOLN
15.0000 [IU] | Freq: Once | INTRAMUSCULAR | Status: DC
  Filled 2017-10-30: qty 0.15

## 2017-10-30 MED ORDER — EPINEPHRINE PF 1 MG/ML IJ SOLN
0.5000 ug/min | INTRAVENOUS | Status: DC
  Administered 2017-10-30: 2 ug/min via INTRAVENOUS
  Administered 2017-10-31: 10 ug/min via INTRAVENOUS
  Filled 2017-10-30 (×4): qty 4

## 2017-10-30 MED ORDER — SODIUM CHLORIDE 0.9 % IV BOLUS (SEPSIS)
1000.0000 mL | Freq: Once | INTRAVENOUS | Status: AC
  Administered 2017-10-30: 1000 mL via INTRAVENOUS

## 2017-10-30 MED ORDER — SODIUM CHLORIDE 0.9% FLUSH
10.0000 mL | Freq: Two times a day (BID) | INTRAVENOUS | Status: DC
  Administered 2017-10-30: 40 mL
  Administered 2017-10-31: 10 mL
  Administered 2017-10-31 – 2017-11-01 (×2): 20 mL
  Administered 2017-11-01 – 2017-11-06 (×6): 10 mL
  Administered 2017-11-06: 20 mL
  Administered 2017-11-07: 3 mL
  Administered 2017-11-07: 20 mL
  Administered 2017-11-08: 3 mL
  Administered 2017-11-08 – 2017-11-26 (×28): 10 mL

## 2017-10-30 MED ORDER — FENTANYL CITRATE (PF) 100 MCG/2ML IJ SOLN
50.0000 ug | Freq: Once | INTRAMUSCULAR | Status: AC
  Administered 2017-10-30: 50 ug via INTRAVENOUS
  Filled 2017-10-30: qty 2

## 2017-10-30 MED ORDER — NALOXONE HCL 2 MG/2ML IJ SOSY
PREFILLED_SYRINGE | INTRAMUSCULAR | Status: AC
  Filled 2017-10-30: qty 8

## 2017-10-30 MED ORDER — MIDAZOLAM HCL 2 MG/2ML IJ SOLN
4.0000 mg | Freq: Once | INTRAMUSCULAR | Status: AC
  Administered 2017-10-30: 4 mg via INTRAVENOUS

## 2017-10-30 MED ORDER — INSULIN REGULAR BOLUS VIA INFUSION
0.0000 [IU] | Freq: Three times a day (TID) | INTRAVENOUS | Status: DC
  Filled 2017-10-30: qty 10

## 2017-10-30 MED ORDER — MIDAZOLAM HCL 2 MG/2ML IJ SOLN
INTRAMUSCULAR | Status: AC
  Administered 2017-10-30: 4 mg via INTRAVENOUS
  Filled 2017-10-30: qty 4

## 2017-10-30 MED ORDER — NOREPINEPHRINE 4 MG/250ML-% IV SOLN
0.0000 ug/min | Freq: Once | INTRAVENOUS | Status: DC
  Filled 2017-10-30: qty 250

## 2017-10-30 MED ORDER — STERILE WATER FOR INJECTION IJ SOLN
INTRAMUSCULAR | Status: AC
  Administered 2017-10-30: 23:00:00
  Filled 2017-10-30: qty 10

## 2017-10-30 MED ORDER — ATROPINE SULFATE 1 MG/ML IJ SOLN
1.0000 mg | Freq: Once | INTRAMUSCULAR | Status: AC
  Administered 2017-10-30: 1 mg via INTRAVENOUS
  Filled 2017-10-30: qty 1

## 2017-10-30 MED ORDER — FENTANYL BOLUS VIA INFUSION
50.0000 ug | INTRAVENOUS | Status: DC | PRN
  Administered 2017-10-31 – 2017-11-01 (×3): 100 ug via INTRAVENOUS
  Administered 2017-11-01: 50 ug via INTRAVENOUS
  Administered 2017-11-02 – 2017-11-03 (×2): 100 ug via INTRAVENOUS
  Administered 2017-11-04: 50 ug via INTRAVENOUS
  Administered 2017-11-04: 100 ug via INTRAVENOUS
  Administered 2017-11-04: 50 ug via INTRAVENOUS
  Administered 2017-11-04: 100 ug via INTRAVENOUS
  Administered 2017-11-04: 50 ug via INTRAVENOUS
  Administered 2017-11-05 – 2017-11-08 (×9): 100 ug via INTRAVENOUS
  Filled 2017-10-30: qty 100

## 2017-10-30 MED ORDER — MIDAZOLAM HCL 2 MG/2ML IJ SOLN
2.0000 mg | INTRAMUSCULAR | Status: DC | PRN
  Administered 2017-10-30 – 2017-11-05 (×12): 2 mg via INTRAVENOUS
  Filled 2017-10-30 (×11): qty 2

## 2017-10-30 NOTE — Consult Note (Signed)
[facility] Critical Care Medicine Consultation   ASSESSMENT/PLAN   Status post cardiac arrest. Patient unknown downtime prior to EMS arriving with prolonged CPR in emergency department. Ventricular fibrillation was noted, we'll place on targeted temperature management, hypothermia protocol and 36. Will obtain EEG. CT scan of the head has already been done. Cardiology has been consulted on patient  Polysubstance abuse. Known history of drug abuse. Per friends and family he snorts his drugs and are not taken IV area urine drug screen was positive for cocaine and opiates  Renal failure. Patient is hyperkalemic at 6.3 and a creatinine is elevated 2.96. Will need to recheck all laboratory studies. Patient has received aggressive volume resuscitation per chart 16 L  Rhabdomyolysis. Rhabdomyolysis with renal failure. Elevated CK of 7162. Patient will most likely require nephrology consultation and eventual CRRT. We'll place on bicarbonate infusion, repeat BMP  Leukocytosis. Elevated white count at 49.5. Place on broad-spectrum antibiotic coverage  When patient arrives in the intensive care unit will obtain ABG, BMP, CBC and repeat chest x-ray. We'll also obtain arterial and venous studies of his right lower extremity  Critical care time 40 minutes  VENTILATOR SETTINGS: Vent Mode: AC FiO2 (%):  [100 %] 100 % Set Rate:  [20 bmp] 20 bmp Vt Set:  [500 mL] 500 mL PEEP:  [5 cmH20] 5 cmH20  INTAKE / OUTPUT:  Intake/Output Summary (Last 24 hours) at 10/30/2017 1656 Last data filed at 10/30/2017 1454 Gross per 24 hour  Intake 16109 ml  Output -  Net 16200 ml   Name: Joshua [name] MRN: [id number] DOB: Jan 19, 1991    ADMISSION DATE:  10/30/2017 CONSULTATION DATE:  10/30/2017  REFERRING MD :  Hospitalist service/emergency department  CHIEF COMPLAINT:  Status post arrest   HISTORY OF PRESENT ILLNESS:  Joshua [name] is a 27 year old gentleman with a past medical history remarkable for asthma/bronchitis,  polysubstance abuse in particular heroin, cocaine, opiates, apparently was found in his hotel room, unresponsive, with crushed up powder on a table Rolled up in a dollar bill. EMS reported that he was breathing and had pinpoint pupils. He was given Narcan however remained unresponsive and subtotally transferred to the emergency department. Just prior to arriving in emergency department he became unresponsive and apneic. Per emergency room physician he was coded on and off for proximally 45 minutes with eventual return of spontaneous circulation. Ventricular fibrillation was noted in the rhythm. Presently he is in the emergency department, intubated, on an epinephrine infusion with marginal blood pressure. He is unresponsive. Targeted temperature management has not yet been started. Pertinent labs reveal urine drug screen positive for cocaine and opiates, Potassium is elevated at 6.3, he is in renal failure with a BUN of 19 and a creatinine of 2.96, his troponin is 0.92, Tylenol level was less than 10, white count was 49.5, hematocrit 55.7, INR was 1.65, CT head did not reveal any acute intracranial abnormality please see full report. Chest x-ray in emergency department revealed a mainstem intubation with groundglass opacities in the left upper lung zone. Per emergency room physician the endotracheal tube has been retracted.  PAST MEDICAL HISTORY :  Past Medical History:  Diagnosis Date  . Asthma   . Bronchitis   . Hernia cerebri [facility])    Past Surgical History:  Procedure Laterality Date  . ankle/foot surgery with metal plates     Prior to Admission medications   Medication Sig Start Date End Date Taking? Authorizing Provider  albuterol (PROVENTIL HFA;VENTOLIN HFA) 108 (90 BASE)  MCG/ACT inhaler Inhale 2 puffs into the lungs every 6 (six) hours as needed for wheezing or shortness of breath. Patient not taking: Reported on 10/30/2017 05/21/15   Evangeline Dakin, PA-C  amoxicillin (AMOXIL) 875 MG tablet  Take 1 tablet (875 mg total) by mouth 2 (two) times daily. Patient not taking: Reported on 10/30/2017 10/19/17   Cuthriell, Delorise Royals, PA-C  ciprofloxacin (CIPRO) 500 MG tablet Take 1 tablet (500 mg total) by mouth 2 (two) times daily. Patient not taking: Reported on 10/30/2017 09/29/16   Sharyn Creamer, MD  dicyclomine (BENTYL) 20 MG tablet Take 1 tablet (20 mg total) by mouth 3 (three) times daily as needed (abdominal pain). Patient not taking: Reported on 10/30/2017 05/28/16   Phineas Semen, MD  gabapentin (NEURONTIN) 300 MG capsule Take 1 capsule (300 mg total) by mouth 3 (three) times daily. Patient not taking: Reported on 10/30/2017 02/28/16   Lenn Sink, DPM  HYDROcodone-acetaminophen (NORCO/VICODIN) 5-325 MG tablet Take 1 tablet by mouth every 6 (six) hours as needed for moderate pain. Patient not taking: Reported on 10/30/2017 09/29/16   Sharyn Creamer, MD  ibuprofen (ADVIL,MOTRIN) 600 MG tablet Take 1 tablet (600 mg total) by mouth every 8 (eight) hours as needed. Patient not taking: Reported on 10/30/2017 11/14/16   Joni Reining, PA-C  ketorolac (TORADOL) 10 MG tablet Take 1 tablet (10 mg total) by mouth every 6 (six) hours as needed. Patient not taking: Reported on 10/30/2017 03/15/17   Darci Current, MD  magic mouthwash w/lidocaine SOLN Take 5 mLs by mouth 4 (four) times daily. Patient not taking: Reported on 10/30/2017 10/19/17   Cuthriell, Delorise Royals, PA-C  metroNIDAZOLE (FLAGYL) 500 MG tablet Take 1 tablet (500 mg total) by mouth 3 (three) times daily. Patient not taking: Reported on 10/30/2017 09/29/16   Sharyn Creamer, MD  naproxen (NAPROSYN) 500 MG tablet Take 1 tablet (500 mg total) by mouth 2 (two) times daily with a meal. Patient not taking: Reported on 10/30/2017 10/03/17   Joni Reining, PA-C  neomycin-polymyxin-pramoxine (NEOSPORIN PLUS) 1 % cream Apply topically 2 (two) times daily. Patient not taking: Reported on 10/30/2017 11/14/16   Joni Reining, PA-C  ondansetron (ZOFRAN ODT) 4 MG  disintegrating tablet Take 1 tablet (4 mg total) by mouth every 6 (six) hours as needed for nausea or vomiting. Patient not taking: Reported on 10/30/2017 09/29/16   Sharyn Creamer, MD  oxyCODONE-acetaminophen (ROXICET) 5-325 MG tablet Take 1 tablet by mouth every 6 (six) hours as needed for moderate pain. Patient not taking: Reported on 10/30/2017 11/14/16   Joni Reining, PA-C  sulfamethoxazole-trimethoprim (BACTRIM DS,SEPTRA DS) 800-160 MG tablet Take 1 tablet by mouth 2 (two) times daily. Patient not taking: Reported on 10/30/2017 11/14/16   Joni Reining, PA-C   No Known Allergies  FAMILY HISTORY:  No family history on file. SOCIAL HISTORY:  reports that he has been smoking cigarettes.  He has been smoking about 0.50 packs per day. he has never used smokeless tobacco. He reports that he does not drink alcohol or use drugs.  REVIEW OF SYSTEMS:   Patient is unresponsive and unable to contribute to history  VITAL SIGNS: Temp:  [85.2 F (29.6 C)-88.2 F (31.2 C)] 88.2 F (31.2 C) (03/08 1615) Pulse Rate:  [36-156] 79 (03/08 1615) Resp:  [20-39] 20 (03/08 1615) BP: (32-159)/(22-146) 79/61 (03/08 1615) SpO2:  [34 %-100 %] 100 % (03/08 1615) FiO2 (%):  [100 %] 100 % (03/08 1220) Weight:  [74.8  kg (165 lb)] 74.8 kg (165 lb) (03/08 1258) HEMODYNAMICS:   VENTILATOR SETTINGS: Vent Mode: AC FiO2 (%):  [100 %] 100 % Set Rate:  [20 bmp] 20 bmp Vt Set:  [500 mL] 500 mL PEEP:  [5 cmH20] 5 cmH20 INTAKE / OUTPUT:  Intake/Output Summary (Last 24 hours) at 10/30/2017 1656 Last data filed at 10/30/2017 1454 Gross per 24 hour  Intake 0102716200 ml  Output -  Net 16200 ml    Physical Examination:   VS: BP (!) 79/61   Pulse 79   Temp (!) 88.2 F (31.2 C)   Resp 20   Wt 74.8 kg (165 lb)   SpO2 100%   BMI 23.68 kg/m   General Appearance: Unresponsive, orally intubated, hypotensive on epinephrine with multiple contusions noted Neuro: Patient is completely unresponsive HEENT: Orally intubated,  contusion noted above the right eye and forehead, trachea is midline, no stridor noted Pulmonary: Coarse breath sounds left greater than right CardiovascularNormal S1,S2.  No m/r/g.    Abdomen: Benign, Soft Extremities: His right extremity is ecchymotic, purple, extending up the right leg and posteriorly. It is warm and pulses are dopplerable per nurse.   LABS: Reviewed   LABORATORY PANEL:   CBC Recent Labs  Lab 10/30/17 1204  WBC 49.5*  HGB 17.1  HCT 55.7*  PLT 282    Chemistries  Recent Labs  Lab 10/30/17 1204  NA 144  K 6.3*  CL 101  CO2 16*  GLUCOSE 34*  BUN 19  CREATININE 2.96*  CALCIUM 9.6  AST 316*  ALT 233*  ALKPHOS 84  BILITOT 1.4*    Recent Labs  Lab 10/30/17 1445  GLUCAP 155*   No results for input(s): PHART, PCO2ART, PO2ART in the last 168 hours. Recent Labs  Lab 10/30/17 1204  AST 316*  ALT 233*  ALKPHOS 84  BILITOT 1.4*  ALBUMIN 4.9    Cardiac Enzymes Recent Labs  Lab 10/30/17 1204  TROPONINI 0.92*    RADIOLOGY:  Ct Head Wo Contrast  Result Date: 10/30/2017 CLINICAL DATA:  Patient found unresponsive today. Possible drug overdose. EXAM: CT HEAD WITHOUT CONTRAST TECHNIQUE: Contiguous axial images were obtained from the base of the skull through the vertex without intravenous contrast. COMPARISON:  None. FINDINGS: Brain: No evidence of acute infarction, hemorrhage, hydrocephalus, extra-axial collection or mass lesion/mass effect. Vascular: No hyperdense vessel or unexpected calcification. Skull: Intact. Sinuses/Orbits: Scattered ethmoid air cell disease is noted. Minimal mucosal thickening left sphenoid sinus is seen. Other: None. IMPRESSION: No acute abnormality. Mild left sphenoid sinus and scattered ethmoid air cell disease. Electronically Signed   By: Drusilla Kannerhomas  Dalessio M.D.   On: 10/30/2017 15:20   Dg Chest Portable 1 View  Result Date: 10/30/2017 CLINICAL DATA:  Endotracheal tube placement. EXAM: PORTABLE CHEST 1 VIEW COMPARISON:   Radiographs of June 05, 2015. FINDINGS: The heart size and mediastinal contours are within normal limits. Endotracheal tube is seen directed into left mainstem bronchus; withdrawal by 3-4 cm is recommended. Mild bilateral perihilar edema is noted. No pneumothorax is noted. No pleural effusion is noted. The visualized skeletal structures are unremarkable. IMPRESSION: Endotracheal tube directed into left mainstem bronchus; withdrawal by 3-4 cm is recommended. Critical Value/emergent results were called by telephone at the time of interpretation on 10/30/2017 at 12:50 pm to Dr. Sharman CheekPHILLIP STAFFORD , who verbally acknowledged these results. Mild bilateral perihilar edema is noted. Electronically Signed   By: Lupita RaiderJames  Green Jr, M.D.   On: 10/30/2017 12:51    Tora KindredJohn Arcenia Scarbro, DO  10/30/2017, 4:56 PM

## 2017-10-30 NOTE — Progress Notes (Signed)
Notified Maggie, NP that patient was starting to mildly shiver. Also notified Maggie, NP that patient met target temperature goal of 36 degrees at 2100. See new orders for sedation. Will continue to monitor.

## 2017-10-30 NOTE — Progress Notes (Signed)
I spoke with April MansonJason Foster from [facility] regarding this patient's referral.  He will send staff from [facility] to the ICU tomorrow to offer recommendations and determine appropriateness of referral.

## 2017-10-30 NOTE — Code Documentation (Signed)
Dr arida at bedside.  

## 2017-10-30 NOTE — H&P (Signed)
[facility]   PATIENT NAME: Joshua [name]    MR#:  [id number]  DATE OF BIRTH:  08/18/1991  DATE OF ADMISSION:  10/30/2017  PRIMARY CARE PHYSICIAN: Patient, No Pcp Per   REQUESTING/REFERRING PHYSICIAN: stafford  CHIEF COMPLAINT:   Chief Complaint  Patient presents with  . Drug Overdose    HISTORY OF PRESENT ILLNESS: Joshua [name]  is a 27 y.o. male with a known history of asthma- found unresponsive in hotel room,with a white powder crushed up on a table that he had apparently been snorting through a rolled up dollar bill. EMS report that he was breathing on scene and had pinpoint pupils. They gave Narcan but he was still unresponsive and they transported to the ED. Just prior to arrival in the ED the report that he stopped breathing. On arrival he is unresponsive. Was noted V fib arrest, CPR and intubated. Hospitalsit team is called for admission. History obtained from ER physician.    PAST MEDICAL HISTORY:   Past Medical History:  Diagnosis Date  . Asthma   . Bronchitis   . Hernia cerebri (HCC)     PAST SURGICAL HISTORY:  Past Surgical History:  Procedure Laterality Date  . ankle/foot surgery with metal plates      SOCIAL HISTORY:  Social History   Tobacco Use  . Smoking status: Current Every Day Smoker    Packs/day: 0.50    Types: Cigarettes  . Smokeless tobacco: Never Used  Substance Use Topics  . Alcohol use: No    FAMILY HISTORY: No family history on file.  DRUG ALLERGIES: No Known Allergies  REVIEW OF SYSTEMS:   Pt is unresponsive, Intubated, can not give ROS.  MEDICATIONS AT HOME:  Prior to Admission medications   Medication Sig Start Date End Date Taking? Authorizing Provider  albuterol (PROVENTIL HFA;VENTOLIN HFA) 108 (90 BASE) MCG/ACT inhaler Inhale 2 puffs into the lungs every 6 (six) hours as needed for wheezing or shortness of breath. Patient not taking: Reported on 10/30/2017 05/21/15   Evangeline DakinBeers, Charles M, PA-C   amoxicillin (AMOXIL) 875 MG tablet Take 1 tablet (875 mg total) by mouth 2 (two) times daily. Patient not taking: Reported on 10/30/2017 10/19/17   Cuthriell, Delorise RoyalsJonathan D, PA-C  ciprofloxacin (CIPRO) 500 MG tablet Take 1 tablet (500 mg total) by mouth 2 (two) times daily. Patient not taking: Reported on 10/30/2017 09/29/16   Sharyn CreamerQuale, Trig, MD  dicyclomine (BENTYL) 20 MG tablet Take 1 tablet (20 mg total) by mouth 3 (three) times daily as needed (abdominal pain). Patient not taking: Reported on 10/30/2017 05/28/16   Phineas SemenGoodman, Graydon, MD  gabapentin (NEURONTIN) 300 MG capsule Take 1 capsule (300 mg total) by mouth 3 (three) times daily. Patient not taking: Reported on 10/30/2017 02/28/16   Lenn Sinkegal, Norman S, DPM  HYDROcodone-acetaminophen (NORCO/VICODIN) 5-325 MG tablet Take 1 tablet by mouth every 6 (six) hours as needed for moderate pain. Patient not taking: Reported on 10/30/2017 09/29/16   Sharyn CreamerQuale, Elnathan, MD  ibuprofen (ADVIL,MOTRIN) 600 MG tablet Take 1 tablet (600 mg total) by mouth every 8 (eight) hours as needed. Patient not taking: Reported on 10/30/2017 11/14/16   Joni ReiningSmith, Ronald K, PA-C  ketorolac (TORADOL) 10 MG tablet Take 1 tablet (10 mg total) by mouth every 6 (six) hours as needed. Patient not taking: Reported on 10/30/2017 03/15/17   Darci CurrentBrown, Silverton N, MD  magic mouthwash w/lidocaine SOLN Take 5 mLs by mouth 4 (four) times daily. Patient not taking: Reported on  10/30/2017 10/19/17   Cuthriell, Delorise Royals, PA-C  metroNIDAZOLE (FLAGYL) 500 MG tablet Take 1 tablet (500 mg total) by mouth 3 (three) times daily. Patient not taking: Reported on 10/30/2017 09/29/16   Sharyn Creamer, MD  naproxen (NAPROSYN) 500 MG tablet Take 1 tablet (500 mg total) by mouth 2 (two) times daily with a meal. Patient not taking: Reported on 10/30/2017 10/03/17   Joni Reining, PA-C  neomycin-polymyxin-pramoxine (NEOSPORIN PLUS) 1 % cream Apply topically 2 (two) times daily. Patient not taking: Reported on 10/30/2017 11/14/16   Joni Reining, PA-C   ondansetron (ZOFRAN ODT) 4 MG disintegrating tablet Take 1 tablet (4 mg total) by mouth every 6 (six) hours as needed for nausea or vomiting. Patient not taking: Reported on 10/30/2017 09/29/16   Sharyn Creamer, MD  oxyCODONE-acetaminophen (ROXICET) 5-325 MG tablet Take 1 tablet by mouth every 6 (six) hours as needed for moderate pain. Patient not taking: Reported on 10/30/2017 11/14/16   Joni Reining, PA-C  sulfamethoxazole-trimethoprim (BACTRIM DS,SEPTRA DS) 800-160 MG tablet Take 1 tablet by mouth 2 (two) times daily. Patient not taking: Reported on 10/30/2017 11/14/16   Joni Reining, PA-C      PHYSICAL EXAMINATION:   VITAL SIGNS: Blood pressure (!) 79/61, pulse 79, temperature (!) 88.2 F (31.2 C), resp. rate 20, weight 74.8 kg (165 lb), SpO2 100 %.  GENERAL:  27 y.o.-year-old patient lying in the bed with no acute distress.  EYES: Pupils equal, dilated up to 7-8 mm round, not reactive to light . No scleral icterus.  HEENT: Head atraumatic, normocephalic. Oropharynx and nasopharynx clear. ETT in place. NECK:  Supple, no jugular venous distention. No thyroid enlargement, no tenderness.  LUNGS: Normal breath sounds bilaterally, no wheezing, rales,rhonchi or crepitation. No use of accessory muscles of respiration. On vent support. CARDIOVASCULAR: S1, S2 normal. No murmurs, rubs, or gallops.  ABDOMEN: Soft,  nondistended. Bowel sounds present. No organomegaly or mass.  EXTREMITIES: No pedal edema, have right leg and foot cyanosis, or clubbing.  NEUROLOGIC: pt is comatose, Intubated, no response to deep painful stimuli.  PSYCHIATRIC: The patient is comatose.  SKIN: No obvious rash, lesion, or ulcer.   LABORATORY PANEL:   CBC Recent Labs  Lab 10/30/17 1204  WBC 49.5*  HGB 17.1  HCT 55.7*  PLT 282  MCV 100.3*  MCH 30.9  MCHC 30.8*  RDW 13.2  LYMPHSABS 7.1*  MONOABS 2.9*  EOSABS 0.2  BASOSABS 0.2*    ------------------------------------------------------------------------------------------------------------------  Chemistries  Recent Labs  Lab 10/30/17 1204  NA 144  K 6.3*  CL 101  CO2 16*  GLUCOSE 34*  BUN 19  CREATININE 2.96*  CALCIUM 9.6  AST 316*  ALT 233*  ALKPHOS 84  BILITOT 1.4*   ------------------------------------------------------------------------------------------------------------------ estimated creatinine clearance is 38.4 mL/min (A) (by C-G formula based on SCr of 2.96 mg/dL (H)). ------------------------------------------------------------------------------------------------------------------ No results for input(s): TSH, T4TOTAL, T3FREE, THYROIDAB in the last 72 hours.  Invalid input(s): FREET3   Coagulation profile Recent Labs  Lab 10/30/17 1204  INR 1.65   ------------------------------------------------------------------------------------------------------------------- No results for input(s): DDIMER in the last 72 hours. -------------------------------------------------------------------------------------------------------------------  Cardiac Enzymes Recent Labs  Lab 10/30/17 1204  TROPONINI 0.92*   ------------------------------------------------------------------------------------------------------------------ Invalid input(s): POCBNP  ---------------------------------------------------------------------------------------------------------------  Urinalysis    Component Value Date/Time   COLORURINE AMBER (A) 10/30/2017 1315   APPEARANCEUR CLOUDY (A) 10/30/2017 1315   APPEARANCEUR Turbid 05/08/2014 2307   LABSPEC 1.023 10/30/2017 1315   LABSPEC 1.012 05/08/2014 2307   PHURINE 5.0 10/30/2017 1315  GLUCOSEU NEGATIVE 10/30/2017 1315   GLUCOSEU Negative 05/08/2014 2307   HGBUR SMALL (A) 10/30/2017 1315   BILIRUBINUR NEGATIVE 10/30/2017 1315   BILIRUBINUR Negative 05/08/2014 2307   KETONESUR NEGATIVE 10/30/2017 1315    PROTEINUR 30 (A) 10/30/2017 1315   NITRITE NEGATIVE 10/30/2017 1315   LEUKOCYTESUR NEGATIVE 10/30/2017 1315   LEUKOCYTESUR Negative 05/08/2014 2307     RADIOLOGY: Ct Head Wo Contrast  Result Date: 10/30/2017 CLINICAL DATA:  Patient found unresponsive today. Possible drug overdose. EXAM: CT HEAD WITHOUT CONTRAST TECHNIQUE: Contiguous axial images were obtained from the base of the skull through the vertex without intravenous contrast. COMPARISON:  None. FINDINGS: Brain: No evidence of acute infarction, hemorrhage, hydrocephalus, extra-axial collection or mass lesion/mass effect. Vascular: No hyperdense vessel or unexpected calcification. Skull: Intact. Sinuses/Orbits: Scattered ethmoid air cell disease is noted. Minimal mucosal thickening left sphenoid sinus is seen. Other: None. IMPRESSION: No acute abnormality. Mild left sphenoid sinus and scattered ethmoid air cell disease. Electronically Signed   By: Drusilla Kanner M.D.   On: 10/30/2017 15:20   Dg Chest Portable 1 View  Result Date: 10/30/2017 CLINICAL DATA:  Endotracheal tube placement. EXAM: PORTABLE CHEST 1 VIEW COMPARISON:  Radiographs of June 05, 2015. FINDINGS: The heart size and mediastinal contours are within normal limits. Endotracheal tube is seen directed into left mainstem bronchus; withdrawal by 3-4 cm is recommended. Mild bilateral perihilar edema is noted. No pneumothorax is noted. No pleural effusion is noted. The visualized skeletal structures are unremarkable. IMPRESSION: Endotracheal tube directed into left mainstem bronchus; withdrawal by 3-4 cm is recommended. Critical Value/emergent results were called by telephone at the time of interpretation on 10/30/2017 at 12:50 pm to Dr. Sharman Cheek , who verbally acknowledged these results. Mild bilateral perihilar edema is noted. Electronically Signed   By: Lupita Raider, M.D.   On: 10/30/2017 12:51    EKG: Orders placed or performed during the hospital encounter of 10/30/17   . EKG 12-Lead  . EKG 12-Lead  . EKG 12-Lead  . EKG 12-Lead  . EKG 12-Lead  . 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 Coccaine and Opioid in urine.    Ac renal failure    Metabolic acidosis     Rhabdomyolysis    IV fluids, Vent support   Need cooling protocol as per ICU   Bicarb drip.   Vasopressors to support circulation and pressure.   Need to assess anoxic brain damage in 1-2 days, mostly major damage.  * cyanosis on right LL   May have loss of circulation due to shock?   Will need further eval once and if significant recovery.  * Elevated troponin   Echo and monitor.   Need further eval, once significant recovery.  * High WBCs    May be due to this event.   Broad spectrum Abx given by ER.  All the records are reviewed and case discussed with ED provider. Management plans discussed with the patient, family and they are in agreement.  CODE STATUS: Full. Code Status History    This patient does not have a recorded code status. Please follow your organizational policy for patients in this situation.     I spoke to his sister and brother in law in room explained the very poor prognosis.  TOTAL TIME TAKING CARE OF THIS PATIENT: 50 critical care minutes.    Altamese Dilling M.D on 10/30/2017  Between 7am to 6pm - Pager - [phone]  After 6pm go to [email or web address] - password [email or web address] [facility]  Office  [phone]  CC: Primary care physician; Patient, No Pcp Per   Note: This dictation was prepared with Dragon dictation along with smaller phrase technology. Any transcriptional errors that result from this process are unintentional.

## 2017-10-30 NOTE — ED Triage Notes (Addendum)
Pt arrives via ems from hotel room, ems reports that pt was given 8mg  of narcan prior to arrival, pt breathing for ems, pt is agonal at this time. No pulse palpated at this time, cpr initiated.  Police report that housekeeping checked on room and found pt unresponsive. Police report pt was found curled in the fetal position, pt is cold to touch and has mottling to his body

## 2017-10-30 NOTE — Progress Notes (Signed)
Pulmonary/critical care  Consulting nephrology secondary to renal failure will most likely require CRRT. Initial pH is 6.9% on bicarbonate infusion and given multiple doses of bicarbonate. Case discussed with vascular surgery Dr. Gilda [name], concerning for possiblecompartment syndrome with decreased vascular supply. Discussed possibility of further imaging when we have stabilized him. He felt that there is not much that we can do at this time and that if he does have vascular compromise he will most likely require amputation at some point. We'll try to improve pH and hemodynamics, check labs and hopefully leg will improve.  Joshua [name], D.O.

## 2017-10-30 NOTE — Progress Notes (Signed)
Pulmonary/critical care  Procedure note Central line placement Indication: Patient without central access requiring multiple pressors Emergently performed Performed under ultrasound guidance Left internal jugular vein identified Complete contact precautions utilized Topical Hibiclens Subdermal lidocaine given Under ultrasound guidance left internal jugular vein was accessed first pass without difficulty. Dark nonpulsatile venous return obtained Using the Seldinger technique a triple-lumen catheter was placed The guidewire was removed intact Sutured into place Step portable chest x-ray reveals adequate placement of central line without any evidence of pneumothorax or complication  Joshua KindredJohn Montana [name], D.O.

## 2017-10-30 NOTE — ED Notes (Signed)
Report given to ICU staff

## 2017-10-30 NOTE — Progress Notes (Signed)
CDS referral made for patient who's GCS is 3.  I spoke with Joylene JohnVicki Berrier, CDS Representative.  Referral number is [id number].  Chip BoerVicki stated that April MansonJason Foster from CDS will be assigned this case and will call me within 10 minutes.

## 2017-10-30 NOTE — ED Notes (Signed)
Epi and atropine given - verbal order from Dr. Scotty CourtStafford.

## 2017-10-30 NOTE — Progress Notes (Signed)
Chaplain was page for a Overdose. Prayed with care team. No family at this time   10/30/17 1200  Clinical Encounter Type  Visited With Patient  Visit Type Initial;Spiritual support  Referral From Nurse  Consult/Referral To Nurse  Spiritual Encounters  Spiritual Needs Prayer

## 2017-10-30 NOTE — ED Notes (Signed)
CODE STEMI CALLED TO Joshua [name] AT [facility] 

## 2017-10-30 NOTE — Progress Notes (Signed)
Notified Maggie, NP of elevated lactic acid and BG. Also notified of calcium level of 6.0. See new orders will continue to monitor.

## 2017-10-30 NOTE — Progress Notes (Signed)
   Chart reviewed. Echocardiogram ordered.  We will be happy to assist pending echo and meaningful recovery.  Nicolasa Duckinghristopher Keaden Gunnoe, NP

## 2017-10-30 NOTE — Progress Notes (Signed)
Pharmacy Antibiotic Note  Levonne HubertMark A Mikaelian is a 27 y.o. male admitted on 10/30/2017 with Vfib arrest and prolonged CPR. Patient with hyperkalemia, rhabdomyolysis, leukocytosis, and possible compartment syndrome. Pharmacy has been consulted for Unasyn and Vancomycin dosing. Patient with potential to transition to CRRT during current hospitalization.   Plan: Unasyn 3g IV Q6hr.   Patient ordered vancomycin 1g IV x 1. Due to dynamic situation, will not order stacked dose and will continue patient on vancomycin 1250mg  IV Q24hr for goal trough of 15-20. Will obtain trough as clinically indicated and will obtain serum creatinine daily x 3 days.   Weight: 165 lb (74.8 kg)  Temp (24hrs), Avg:86.9 F (30.5 C), Min:85.2 F (29.6 C), Max:88.2 F (31.2 C)  Recent Labs  Lab 10/30/17 1204  WBC 49.5*  CREATININE 2.96*    Estimated Creatinine Clearance: 38.4 mL/min (A) (by C-G formula based on SCr of 2.96 mg/dL (H)).    No Known Allergies  Antimicrobials this admission: Zosyn 3/8 x 1 Unasyn 3/8 >>  Vancomycin 3/8 >>   Dose adjustments this admission: N/A  Microbiology results: 3/8 MRSA PCR: pending   Thank you for allowing pharmacy to be a part of this patient's care.  Simpson,Michael L 10/30/2017 7:02 PM

## 2017-10-30 NOTE — ED Notes (Signed)
Dr. Scotty CourtStafford notified of patient's blood pressure and heart rate. New orders received.

## 2017-10-30 NOTE — ED Notes (Signed)
Family at bedside. 

## 2017-10-30 NOTE — Progress Notes (Signed)
   10/30/17 2200  Clinical Encounter Type  Visited With Family;Patient not available  Visit Type Initial;Critical Care  Referral From Nurse  Consult/Referral To Chaplain  Spiritual Encounters  Spiritual Needs Prayer;Emotional  Stress Factors  Family Stress Factors Family relationships;Health changes   Per pts mother, pt overdosed.  Mother voiced being concerned about pts salvation.  Chaplain comforted mother and facilitated imagery of a loving, accepting God.  Mother requested prayer for family and then requested prayer at bedside.  Chaplain remains available for continued spiritual/emotional support.  Rev. Astoria Condon Zenaida NieceVan [employer] Chaplain

## 2017-10-30 NOTE — Consult Note (Signed)
Date: 10/30/2017                  Patient Name:  Joshua [name]  MRN: [id number]  DOB: 13-May-1991  Age / Sex: 27 y.o., male         PCP: Patient, No Pcp Per                 Service Requesting Consult: CCU/ Altamese Dilling, *                 Reason for Consult: ARF            History of Present Illness: Patient is a 27 y.o. male with medical problems of Intravenous drug abuse, who was admitted to [facility] on 10/30/2017.  Patient is not able to provide any meaningful information.  All information is obtained from the chart Patient arrived via EMS from a hotel room.  EMS reported that patient had agonal breathing.  When housekeeping checked on the patient he was found to be unresponsive, called in a fetal position and cold to touch and had mottling to his body.  He was breathing for EMS.  Drug overdose was suspected because of some powder that was found near to him Patient then underwent prolonged CPR in the emergency room for about 45 minutes.  Ventricular fibrillation was noted and he was placed on targeted temperature management hypothermia protocol Lab results show acute kidney injury with elevated creatinine of 2.96, baseline of 1.07 about 3 weeks ago Glucose was low at 34 Potassium is elevated at 6.3 Alcohol level negative, troponin 0.9, salicylate negative, acetaminophen negative  CK level is elevated at 7100 Urinalysis shows small amount of protein, small blood, 6-30 RBCs Urine drug screen is positive for cocaine and opiates Nephrology consult has been requested for evaluation Currently requiring multiple pressors and ventilator support  Medications: Outpatient medications: Medications Prior to Admission  Medication Sig Dispense Refill Last Dose  . albuterol (PROVENTIL HFA;VENTOLIN HFA) 108 (90 BASE) MCG/ACT inhaler Inhale 2 puffs into the lungs every 6 (six) hours as needed for wheezing or shortness of breath. (Patient not taking: Reported on 10/30/2017) 1 Inhaler 2 Not Taking at  Unknown time  . amoxicillin (AMOXIL) 875 MG tablet Take 1 tablet (875 mg total) by mouth 2 (two) times daily. (Patient not taking: Reported on 10/30/2017) 14 tablet 0 Not Taking at Unknown time  . ciprofloxacin (CIPRO) 500 MG tablet Take 1 tablet (500 mg total) by mouth 2 (two) times daily. (Patient not taking: Reported on 10/30/2017) 20 tablet 0 Not Taking at Unknown time  . dicyclomine (BENTYL) 20 MG tablet Take 1 tablet (20 mg total) by mouth 3 (three) times daily as needed (abdominal pain). (Patient not taking: Reported on 10/30/2017) 30 tablet 0 Not Taking at Unknown time  . gabapentin (NEURONTIN) 300 MG capsule Take 1 capsule (300 mg total) by mouth 3 (three) times daily. (Patient not taking: Reported on 10/30/2017) 90 capsule 3 Not Taking at Unknown time  . HYDROcodone-acetaminophen (NORCO/VICODIN) 5-325 MG tablet Take 1 tablet by mouth every 6 (six) hours as needed for moderate pain. (Patient not taking: Reported on 10/30/2017) 7 tablet 0 Not Taking at Unknown time  . ibuprofen (ADVIL,MOTRIN) 600 MG tablet Take 1 tablet (600 mg total) by mouth every 8 (eight) hours as needed. (Patient not taking: Reported on 10/30/2017) 15 tablet 0 Not Taking at Unknown time  . ketorolac (TORADOL) 10 MG tablet Take 1 tablet (10 mg total) by mouth every 6 (  six) hours as needed. (Patient not taking: Reported on 10/30/2017) 20 tablet 0 Not Taking at Unknown time  . magic mouthwash w/lidocaine SOLN Take 5 mLs by mouth 4 (four) times daily. (Patient not taking: Reported on 10/30/2017) 240 mL 0 Not Taking at Unknown time  . metroNIDAZOLE (FLAGYL) 500 MG tablet Take 1 tablet (500 mg total) by mouth 3 (three) times daily. (Patient not taking: Reported on 10/30/2017) 30 tablet 0 Not Taking at Unknown time  . naproxen (NAPROSYN) 500 MG tablet Take 1 tablet (500 mg total) by mouth 2 (two) times daily with a meal. (Patient not taking: Reported on 10/30/2017) 20 tablet 00 Not Taking at Unknown time  . neomycin-polymyxin-pramoxine (NEOSPORIN PLUS)  1 % cream Apply topically 2 (two) times daily. (Patient not taking: Reported on 10/30/2017) 14.2 g 0 Not Taking at Unknown time  . ondansetron (ZOFRAN ODT) 4 MG disintegrating tablet Take 1 tablet (4 mg total) by mouth every 6 (six) hours as needed for nausea or vomiting. (Patient not taking: Reported on 10/30/2017) 20 tablet 0 Not Taking at Unknown time  . oxyCODONE-acetaminophen (ROXICET) 5-325 MG tablet Take 1 tablet by mouth every 6 (six) hours as needed for moderate pain. (Patient not taking: Reported on 10/30/2017) 12 tablet 0 Not Taking at Unknown time  . sulfamethoxazole-trimethoprim (BACTRIM DS,SEPTRA DS) 800-160 MG tablet Take 1 tablet by mouth 2 (two) times daily. (Patient not taking: Reported on 10/30/2017) 20 tablet 0 Not Taking at Unknown time    Current medications: Current Facility-Administered Medications  Medication Dose Route Frequency Provider Last Rate Last Dose  . Ampicillin-Sulbactam (UNASYN) 3 g in sodium chloride 0.9 % 100 mL IVPB  3 g Intravenous Q6H Conforti, John, DO      . docusate sodium (COLACE) capsule 100 mg  100 mg Oral BID PRN Altamese Dilling, MD      . EPINEPHrine (ADRENALIN) 4 mg in dextrose 5 % 250 mL (0.016 mg/mL) infusion  0.5-20 mcg/min Intravenous Titrated Sharman Cheek, MD 75 mL/hr at 10/30/17 1454 20 mcg/min at 10/30/17 1454  . famotidine (PEPCID) IVPB 20 mg premix  20 mg Intravenous Q24H Conforti, John, DO      . heparin injection 5,000 Units  5,000 Units Subcutaneous Q8H Altamese Dilling, MD      . norepinephrine (LEVOPHED) 4mg  in D5W premix infusion  0-40 mcg/min Intravenous Titrated Altamese Dilling, MD      . sodium bicarbonate 150 mEq in dextrose 5 % 1,000 mL infusion   Intravenous Continuous Sharman Cheek, MD      . sodium bicarbonate injection 200 mEq  200 mEq Intravenous STAT Conforti, John, DO      . vancomycin (VANCOCIN) IVPB 1000 mg/200 mL premix  1,000 mg Intravenous Once Conforti, John, DO          Allergies: No  Known Allergies    Past Medical History: Past Medical History:  Diagnosis Date  . Asthma   . Bronchitis   . Hernia cerebri [facility])      Past Surgical History: Past Surgical History:  Procedure Laterality Date  . ankle/foot surgery with metal plates       Family History: No family history on file.   Social History: Social History   Socioeconomic History  . Marital status: Married    Spouse name: Not on file  . Number of children: Not on file  . Years of education: Not on file  . Highest education level: Not on file  Social Needs  . Financial resource strain:  Not on file  . Food insecurity - worry: Not on file  . Food insecurity - inability: Not on file  . Transportation needs - medical: Not on file  . Transportation needs - non-medical: Not on file  Occupational History  . Not on file  Tobacco Use  . Smoking status: Current Every Day Smoker    Packs/day: 0.50    Types: Cigarettes  . Smokeless tobacco: Never Used  Substance and Sexual Activity  . Alcohol use: No  . Drug use: No  . Sexual activity: Not on file  Other Topics Concern  . Not on file  Social History Narrative  . Not on file     Review of Systems: Not available Gen:  HEENT:  CV:  Resp:  GI: GU :  MS:  Derm:   Psych: Heme:  Neuro:  Endocrine  Vital Signs: Blood pressure (!) 79/61, pulse 79, temperature (!) 88.2 F (31.2 C), resp. rate 20, weight 74.8 kg (165 lb), SpO2 100 %.   Intake/Output Summary (Last 24 hours) at 10/30/2017 1829 Last data filed at 10/30/2017 1454 Gross per 24 hour  Intake 1610916200 ml  Output -  Net 16200 ml    Weight trends: [employer]   10/30/17 1258  Weight: 74.8 kg (165 lb)    Physical Exam: General:  Critically ill appearing  HEENT  pupils are bilaterally widely dilated, nonreactive to light  Neck:  No masses  Lungs:  Ventilator assisted  Heart::  Regular rhythm  Abdomen:  Soft  Extremities:  No edema, right leg below the knee mottling,   Neurologic:  Unresponsive  Skin:  Mottling changes of the right leg below the knee     Foley:  In place with dark yellow urine       Lab results: Basic Metabolic Panel: Recent Labs  Lab 10/30/17 1204  NA 144  K 6.3*  CL 101  CO2 16*  GLUCOSE 34*  BUN 19  CREATININE 2.96*  CALCIUM 9.6    Liver Function Tests: Recent Labs  Lab 10/30/17 1204  AST 316*  ALT 233*  ALKPHOS 84  BILITOT 1.4*  PROT 8.1  ALBUMIN 4.9   No results for input(s): LIPASE, AMYLASE in the last 168 hours. No results for input(s): AMMONIA in the last 168 hours.  CBC: Recent Labs  Lab 10/30/17 1204  WBC 49.5*  NEUTROABS 39.0*  HGB 17.1  HCT 55.7*  MCV 100.3*  PLT 282    Cardiac Enzymes: Recent Labs  Lab 10/30/17 1204  CKTOTAL 7,162*  TROPONINI 0.92*    BNP: Invalid input(s): POCBNP  CBG: Recent Labs  Lab 10/30/17 1445 10/30/17 1742  GLUCAP 155* 168*    Microbiology: No results found for this or any previous visit (from the past 720 hour(s)).   Coagulation Studies: Recent Labs    10/30/17 1204  LABPROT 19.4*  INR 1.65    Urinalysis: Recent Labs    10/30/17 1315  COLORURINE AMBER*  LABSPEC 1.023  PHURINE 5.0  GLUCOSEU NEGATIVE  HGBUR SMALL*  BILIRUBINUR NEGATIVE  KETONESUR NEGATIVE  PROTEINUR 30*  NITRITE NEGATIVE  LEUKOCYTESUR NEGATIVE        Imaging: Ct Head Wo Contrast  Result Date: 10/30/2017 CLINICAL DATA:  Patient found unresponsive today. Possible drug overdose. EXAM: CT HEAD WITHOUT CONTRAST TECHNIQUE: Contiguous axial images were obtained from the base of the skull through the vertex without intravenous contrast. COMPARISON:  None. FINDINGS: Brain: No evidence of acute infarction, hemorrhage, hydrocephalus, extra-axial collection or mass lesion/mass  effect. Vascular: No hyperdense vessel or unexpected calcification. Skull: Intact. Sinuses/Orbits: Scattered ethmoid air cell disease is noted. Minimal mucosal thickening left sphenoid sinus is seen.  Other: None. IMPRESSION: No acute abnormality. Mild left sphenoid sinus and scattered ethmoid air cell disease. Electronically Signed   By: Drusilla Kanner M.D.   On: 10/30/2017 15:20   Dg Chest Portable 1 View  Result Date: 10/30/2017 CLINICAL DATA:  Endotracheal tube placement. EXAM: PORTABLE CHEST 1 VIEW COMPARISON:  Radiographs of June 05, 2015. FINDINGS: The heart size and mediastinal contours are within normal limits. Endotracheal tube is seen directed into left mainstem bronchus; withdrawal by 3-4 cm is recommended. Mild bilateral perihilar edema is noted. No pneumothorax is noted. No pleural effusion is noted. The visualized skeletal structures are unremarkable. IMPRESSION: Endotracheal tube directed into left mainstem bronchus; withdrawal by 3-4 cm is recommended. Critical Value/emergent results were called by telephone at the time of interpretation on 10/30/2017 at 12:50 pm to Dr. Sharman Cheek , who verbally acknowledged these results. Mild bilateral perihilar edema is noted. Electronically Signed   By: Lupita Raider, M.D.   On: 10/30/2017 12:51      Assessment & Plan: Pt is a 27 y.o. Caucasian   male with a PMHX of asthma, bronchitis, polysubstance abuse, was admitted on 10/30/2017 after he was found down in a hotel for an unknown period of time in the fetal position.  Hospital course is complicated by prolonged CPR for 45 minutes in the emergency room.  Ventricular fibrillation arrest, hypothermia protocol.  Patient is also noted to have severe hyperkalemia, acute renal failure  1.  Acute renal failure, nonoliguric 2.  Severe hyperkalemia 3.  Rhabdomyolysis, possible compartment syndrome of rt leg 4.  Elevated LFTs 5.  Elevated troponin 6.  Hypotensive requiring IV pressors 7.  Acute respiratory failure, requiring ventilator support  Patient is critically ill and has rhabdomyolysis along with possible compartment syndrome of his right leg Potassium and  ABGs are being serially  monitored He has started to have some urine output with IV fluid resuscitation EEG is being done Agree with bicarb supplementation Overall patient's prognosis is poor as he had a prolonged CPR for V. fib arrest If family desires aggressive care, CRRT can be considered if hyperkalemia and acidosis are persistent Case d/w Dr Lonn [place]   LOS: 0 Harjit Leider Thedore Mins 3/8/20196:29 PM  [facility] [address] [place], [place] [phone]  Note: This note was prepared with Dragon dictation. Any transcription errors are unintentional

## 2017-10-30 NOTE — ED Notes (Signed)
2 amps of bicarb given- V.O. Dr. Scotty CourtStafford.

## 2017-10-30 NOTE — ED Provider Notes (Addendum)
[facility] Emergency Department Provider Note  ____________________________________________  Time seen: Approximately 1:15 PM  I have reviewed the triage vital signs and the nursing notes.   HISTORY  Chief Complaint Drug Overdose  Level 5 caveat:  Portions of the history and physical were unable to be obtained due to: cardiac arrest and unresponsive   HPI Joshua [name] is a 27 y.o. male brought to the ED due to unresponsiveness. He was found in a hotel room with a white powder crushed up on a table that he had apparently been snorting through a rolled up dollar bill. EMS report that he was breathing on scene and had pinpoint pupils. They gave Narcan but he was still unresponsive and they transported to the ED. Just prior to arrival in the ED the report that he stopped breathing. On arrival he is unresponsive.     Past Medical History:  Diagnosis Date  . Asthma   . Bronchitis   . Hernia cerebri [facility])      Patient Active Problem List   Diagnosis Date Noted  . Cardiogenic shock ([place]) 10/30/2017  . Cardiac arrest ([place]) 10/30/2017  . Drug abuse ([place]) 10/30/2017     Past Surgical History:  Procedure Laterality Date  . ankle/foot surgery with metal plates       Prior to Admission medications   Medication Sig Start Date End Date Taking? Authorizing Provider  albuterol (PROVENTIL HFA;VENTOLIN HFA) 108 (90 BASE) MCG/ACT inhaler Inhale 2 puffs into the lungs every 6 (six) hours as needed for wheezing or shortness of breath. Patient not taking: Reported on 10/30/2017 05/21/15   Arlyss Repress, PA-C  amoxicillin (AMOXIL) 875 MG tablet Take 1 tablet (875 mg total) by mouth 2 (two) times daily. Patient not taking: Reported on 10/30/2017 10/19/17   Cuthriell, Charline Bills, PA-C  ciprofloxacin (CIPRO) 500 MG tablet Take 1 tablet (500 mg total) by mouth 2 (two) times daily. Patient not taking: Reported on 10/30/2017 09/29/16   Delman Kitten, MD  dicyclomine (BENTYL) 20 MG  tablet Take 1 tablet (20 mg total) by mouth 3 (three) times daily as needed (abdominal pain). Patient not taking: Reported on 10/30/2017 05/28/16   Nance Pear, MD  gabapentin (NEURONTIN) 300 MG capsule Take 1 capsule (300 mg total) by mouth 3 (three) times daily. Patient not taking: Reported on 10/30/2017 02/28/16   Wallene Huh, DPM  HYDROcodone-acetaminophen (NORCO/VICODIN) 5-325 MG tablet Take 1 tablet by mouth every 6 (six) hours as needed for moderate pain. Patient not taking: Reported on 10/30/2017 09/29/16   Delman Kitten, MD  ibuprofen (ADVIL,MOTRIN) 600 MG tablet Take 1 tablet (600 mg total) by mouth every 8 (eight) hours as needed. Patient not taking: Reported on 10/30/2017 11/14/16   Sable Feil, PA-C  ketorolac (TORADOL) 10 MG tablet Take 1 tablet (10 mg total) by mouth every 6 (six) hours as needed. Patient not taking: Reported on 10/30/2017 03/15/17   Gregor Hams, MD  magic mouthwash w/lidocaine SOLN Take 5 mLs by mouth 4 (four) times daily. Patient not taking: Reported on 10/30/2017 10/19/17   Cuthriell, Charline Bills, PA-C  metroNIDAZOLE (FLAGYL) 500 MG tablet Take 1 tablet (500 mg total) by mouth 3 (three) times daily. Patient not taking: Reported on 10/30/2017 09/29/16   Delman Kitten, MD  naproxen (NAPROSYN) 500 MG tablet Take 1 tablet (500 mg total) by mouth 2 (two) times daily with a meal. Patient not taking: Reported on 10/30/2017 10/03/17   Sable Feil, PA-C  neomycin-polymyxin-pramoxine (NEOSPORIN PLUS) 1 % cream Apply topically 2 (two) times daily. Patient not taking: Reported on 10/30/2017 11/14/16   Sable Feil, PA-C  ondansetron (ZOFRAN ODT) 4 MG disintegrating tablet Take 1 tablet (4 mg total) by mouth every 6 (six) hours as needed for nausea or vomiting. Patient not taking: Reported on 10/30/2017 09/29/16   Delman Kitten, MD  oxyCODONE-acetaminophen (ROXICET) 5-325 MG tablet Take 1 tablet by mouth every 6 (six) hours as needed for moderate pain. Patient not taking: Reported on  10/30/2017 11/14/16   Sable Feil, PA-C  sulfamethoxazole-trimethoprim (BACTRIM DS,SEPTRA DS) 800-160 MG tablet Take 1 tablet by mouth 2 (two) times daily. Patient not taking: Reported on 10/30/2017 11/14/16   Sable Feil, PA-C     Allergies Patient has no known allergies.   No family history on file.  Social History Social History   Tobacco Use  . Smoking status: Current Every Day Smoker    Packs/day: 0.50    Types: Cigarettes  . Smokeless tobacco: Never Used  Substance Use Topics  . Alcohol use: No  . Drug use: No    Review of Systems unable to obtain, unresponsive  ____________________________________________   PHYSICAL EXAM:  VITAL SIGNS: ED Triage Vitals  Enc Vitals Group     BP 10/30/17 1210 94/78     Pulse Rate 10/30/17 1205 (!) 156     Resp 10/30/17 1205 (!) 25     Temp --      Temp src --      SpO2 10/30/17 1205 (!) 78 %     Weight 10/30/17 1258 165 lb (74.8 kg)     Height --      Head Circumference --      Peak Flow --      Pain Score --      Pain Loc --      Pain Edu? --      Excl. in [place]? --     Vital signs reviewed, nursing assessments reviewed.   Constitutional:  unresponsive Eyes:   dilated, unreactive. ENT   Head:   Normocephalicwith small superficial laceration at the bridge of the nose.   Nose:   No congestion/rhinnorhea.no epistaxis    Mouth/Throat:   dry mucous membranes    Neck:   no obvious deformity or injury Hematological/Lymphatic/Immunilogical:   No cervical lymphadenopathy. Cardiovascular:   cardiac arrests.  Respiratory:   no spontaneous respirations Gastrointestinal:   Soft . Non distended.  Genitourinary:   normal genitalia Musculoskeletal:   mottled skin, cold extremities. Scattered abrasions, no obvious wounds.. Neurologic:   GCS 3  Skin:    Skin is warm, dry and intact. No rash noted.  No petechiae, purpura, or bullae.  ____________________________________________    LABS (pertinent  positives/negatives) (all labs ordered are listed, but only abnormal results are displayed) Labs Reviewed  COMPREHENSIVE METABOLIC PANEL - Abnormal; Notable for the following components:      Result Value   Potassium 6.3 (*)    CO2 16 (*)    Glucose, Bld 34 (*)    Creatinine, Ser 2.96 (*)    AST 316 (*)    ALT 233 (*)    Total Bilirubin 1.4 (*)    GFR calc non Af Amer 27 (*)    GFR calc Af Amer 32 (*)    Anion gap 27 (*)    All other components within normal limits  TROPONIN I - Abnormal; Notable for the following components:   Troponin I  0.92 (*)    All other components within normal limits  ACETAMINOPHEN LEVEL - Abnormal; Notable for the following components:   Acetaminophen (Tylenol), Serum <10 (*)    All other components within normal limits  CBC WITH DIFFERENTIAL/PLATELET - Abnormal; Notable for the following components:   WBC 49.5 (*)    HCT 55.7 (*)    MCV 100.3 (*)    MCHC 30.8 (*)    Neutro Abs 39.0 (*)    Lymphs Abs 7.1 (*)    Monocytes Absolute 2.9 (*)    Basophils Absolute 0.2 (*)    All other components within normal limits  PROTIME-INR - Abnormal; Notable for the following components:   Prothrombin Time 19.4 (*)    All other components within normal limits  CK - Abnormal; Notable for the following components:   Total CK 7,162 (*)    All other components within normal limits  URINALYSIS, COMPLETE (UACMP) WITH MICROSCOPIC - Abnormal; Notable for the following components:   Color, Urine AMBER (*)    APPearance CLOUDY (*)    Hgb urine dipstick SMALL (*)    Protein, ur 30 (*)    Squamous Epithelial / LPF 0-5 (*)    All other components within normal limits  URINE DRUG SCREEN, QUALITATIVE ([facility] ONLY) - Abnormal; Notable for the following components:   Cocaine Metabolite,Ur [place] POSITIVE (*)    Opiate, Ur Screen POSITIVE (*)    All other components within normal limits  GLUCOSE, CAPILLARY - Abnormal; Notable for the following components:   Glucose-Capillary 155  (*)    All other components within normal limits  ETHANOL  SALICYLATE LEVEL  CBG MONITORING, ED   ____________________________________________   EKG  initial EKG at 12:13 PM shows junctional bradycardia, rate of 42, normal axis, wide QRS, right bundle branch block, ST elevation  Repeat EKG at 12:18 PM shows junctional rhythm, rate of 42, grossly unchanged.  Repeat EKG at 12:29 PM (after 4 A of bicarbonate) shows junctional rhythm, rate of 50, normal axis. Right bundle branch block without any narrowing of the QRS interval. Persistent ST depression in anterior leads and repolarization abnormality. No significant change.  ____________________________________________    RADIOLOGY  Ct Head Wo Contrast  Result Date: 10/30/2017 CLINICAL DATA:  Patient found unresponsive today. Possible drug overdose. EXAM: CT HEAD WITHOUT CONTRAST TECHNIQUE: Contiguous axial images were obtained from the base of the skull through the vertex without intravenous contrast. COMPARISON:  None. FINDINGS: Brain: No evidence of acute infarction, hemorrhage, hydrocephalus, extra-axial collection or mass lesion/mass effect. Vascular: No hyperdense vessel or unexpected calcification. Skull: Intact. Sinuses/Orbits: Scattered ethmoid air cell disease is noted. Minimal mucosal thickening left sphenoid sinus is seen. Other: None. IMPRESSION: No acute abnormality. Mild left sphenoid sinus and scattered ethmoid air cell disease. Electronically Signed   By: Inge Rise M.D.   On: 10/30/2017 15:20   Dg Chest Portable 1 View  Result Date: 10/30/2017 CLINICAL DATA:  Endotracheal tube placement. EXAM: PORTABLE CHEST 1 VIEW COMPARISON:  Radiographs of June 05, 2015. FINDINGS: The heart size and mediastinal contours are within normal limits. Endotracheal tube is seen directed into left mainstem bronchus; withdrawal by 3-4 cm is recommended. Mild bilateral perihilar edema is noted. No pneumothorax is noted. No pleural effusion is  noted. The visualized skeletal structures are unremarkable. IMPRESSION: Endotracheal tube directed into left mainstem bronchus; withdrawal by 3-4 cm is recommended. Critical Value/emergent results were called by telephone at the time of interpretation on 10/30/2017 at 12:50 pm to  Dr. Carrie Mew , who verbally acknowledged these results. Mild bilateral perihilar edema is noted. Electronically Signed   By: Marijo Conception, M.D.   On: 10/30/2017 12:51    ____________________________________________   PROCEDURES .Critical Care Performed by: Carrie Mew, MD Authorized by: Carrie Mew, MD   Critical care provider statement:    Critical care time (minutes):  90   Critical care time was exclusive of:  Separately billable procedures and treating other patients   Critical care was necessary to treat or prevent imminent or life-threatening deterioration of the following conditions:  Circulatory failure, cardiac failure, respiratory failure, renal failure, shock and toxidrome   Critical care was time spent personally by me on the following activities:  Development of treatment plan with patient or surrogate, discussions with consultants, evaluation of patient's response to treatment, examination of patient, obtaining history from patient or surrogate, ordering and performing treatments and interventions, ordering and review of laboratory studies, ordering and review of radiographic studies, pulse oximetry, re-evaluation of patient's condition and review of old charts  Procedure Name: Intubation Date/Time: 10/30/2017 4:04 PM Performed by: Carrie Mew, MD Pre-anesthesia Checklist: Patient identified, Emergency Drugs available, Suction available and Patient being monitored Oxygen Delivery Method: Ambu bag Preoxygenation: Pre-oxygenation with 100% oxygen Induction Type: Cricoid Pressure applied Laryngoscope Size: Glidescope, Mac and 3 Grade View: Grade I Tube size: 8.0 mm Number of  attempts: 1 Airway Equipment and Method: Stylet Placement Confirmation: ETT inserted through vocal cords under direct vision,  CO2 detector and Breath sounds checked- equal and bilateral Secured at: 24 cm Tube secured with: ETT holder Dental Injury: Teeth and Oropharynx as per pre-operative assessment        ____________________________________________    CLINICAL IMPRESSION / [place] / ED COURSE  Pertinent labs & imaging results that were available during my care of the patient were reviewed by me and considered in my medical decision making (see chart for details).   patient presents unresponsive, cardiac arrest on arrival. Received ACLS including multiple rounds of epinephrine, 2 electrical defibrillations for ventricular fibrillation on pulse check, 2 doses of amiodarone, multiple doses of IV calcium and bicarbonate. Did have return of spontaneous circulation and patient has been intubated, and did start exhibiting some purposeful movements although he was not able to follow commands and was given vecuronium to allow for continued safe medical management pending further workup and management of his acute critical illness. Prognosis is poor.  Clinical Course as of Oct 30 1708  Fri Oct 30, 2017  1302 D/w mother by phone. She is coming to ED. Serum glucose 34. D50 x2  [PS]  1312 ? Due to multiple epi doses.  Will cover with abx in case there is underlying infection, though I doubt sepsis at this time.  WBC: (!) 49.5 [PS]  1343 Labs c/w acute renal failure, rhabdomyolysis, nstemi. Code stemi was called at 12:17; Dr. Fletcher Anon arrived at bedside within 10 minutes but advised further medicasl workup and management as patient was not a candidate for cath lab at this time, ekg non-diagnositic per his review.   [PS]  1740 Persistently hypotensive and hypothermic despite ongoing fluid boluses and warming measures with fluid and baer hugger. Prognosis increasingly poor with failure to  respond to resuscitative efforts. Aunt as arrived to bedside, is awaiting pt's mother and sister to arrive.  [PS]  1406 Currently no available ICU beds at [facility] or [facility] or [facility] system. Pt will continue to receive care in the ED until ICU  bed availability improves.   [PS]  1432 Uds +cocaine and opiate. CK very elevated c/w rhabdomyolysis.   [PS]  80 Sister and mother updated.  [PS]  7017 Currently no ICU beds at [facility], [facility], or [facility] system. Pt will have to board in ED.   [PS]  7939 CT head without acute disease.  No evidence of hypoxic brain injury or ICH.  [PS]  0300 D/w critical care, advises pt should be a code ice, target temp 36 degrees celcius.  Currenly pt is 31 degrees celsius, which is the warmest he has been so far in the ED.   [PS]    Clinical Course User Index [PS] Carrie Mew, MD    ----------------------------------------- 4:27 PM on 10/30/2017 -----------------------------------------  Discussed with critical care who will evaluate for further recommendations. May have ICU bed available. Discussed with hospitalist.  ----------------------------------------- 5:10 PM on 10/30/2017 -----------------------------------------  Intensivist recommends bicarbonate infusion for rhabdomyolysis as well as a second pressor for refractory hypertension. Would recommend targeted temperature management, target 36C. Patient currently 31C, we'll continue to warm until targeted temperature and then maintain hypothermic.   ____________________________________________   FINAL CLINICAL IMPRESSION(S) / ED DIAGNOSES    Final diagnoses:  Polysubstance abuse ([place])  Drug overdose, undetermined intent, initial encounter  Non-traumatic rhabdomyolysis  Cardiac arrest ([place])  Acute respiratory failure with hypoxia ([place])  Hypothermia, initial encounter  Acute renal failure, unspecified acute renal failure type [facility])     ED Discharge Orders    None      Portions of this note were  generated with dragon dictation software. Dictation errors may occur despite best attempts at proofreading.    Carrie Mew, MD 10/30/17 Marlana Latus    Carrie Mew, MD 10/30/17 [phone]

## 2017-10-31 ENCOUNTER — Inpatient Hospital Stay: Payer: Self-pay

## 2017-10-31 ENCOUNTER — Inpatient Hospital Stay (HOSPITAL_COMMUNITY)
Admit: 2017-10-31 | Discharge: 2017-10-31 | Disposition: A | Discharge status: Home / Self Care | Payer: Self-pay | Attending: Nurse Practitioner | Admitting: Nurse Practitioner

## 2017-10-31 DIAGNOSIS — I96 Gangrene, not elsewhere classified: Secondary | ICD-10-CM

## 2017-10-31 DIAGNOSIS — I371 Nonrheumatic pulmonary valve insufficiency: Secondary | ICD-10-CM

## 2017-10-31 LAB — COMPREHENSIVE METABOLIC PANEL
ALK PHOS: 39 U/L (ref 38–126)
ALT: 659 U/L — AB (ref 17–63)
AST: 1662 U/L — AB (ref 15–41)
Albumin: 2.6 g/dL — ABNORMAL LOW (ref 3.5–5.0)
Anion gap: 21 — ABNORMAL HIGH (ref 5–15)
BILIRUBIN TOTAL: 1.4 mg/dL — AB (ref 0.3–1.2)
BUN: 37 mg/dL — AB (ref 6–20)
CHLORIDE: 101 mmol/L (ref 101–111)
CO2: 25 mmol/L (ref 22–32)
CREATININE: 3.45 mg/dL — AB (ref 0.61–1.24)
Calcium: 6.4 mg/dL — CL (ref 8.9–10.3)
GFR, EST AFRICAN AMERICAN: 26 mL/min — AB (ref >60)
GFR, EST NON AFRICAN AMERICAN: 23 mL/min — AB (ref >60)
Glucose, Bld: 445 mg/dL — ABNORMAL HIGH (ref 65–99)
Potassium: 2.8 mmol/L — ABNORMAL LOW (ref 3.5–5.1)
Sodium: 147 mmol/L — ABNORMAL HIGH (ref 135–145)
TOTAL PROTEIN: 4.9 g/dL — AB (ref 6.5–8.1)

## 2017-10-31 LAB — GLUCOSE, CAPILLARY
GLUCOSE-CAPILLARY: 377 mg/dL — AB (ref 65–99)
GLUCOSE-CAPILLARY: 366 mg/dL — AB (ref 65–99)
GLUCOSE-CAPILLARY: 397 mg/dL — AB (ref 65–99)
GLUCOSE-CAPILLARY: 125 mg/dL — AB (ref 65–99)
GLUCOSE-CAPILLARY: 46 mg/dL — AB (ref 65–99)
GLUCOSE-CAPILLARY: 42 mg/dL — AB (ref 65–99)
GLUCOSE-CAPILLARY: 183 mg/dL — AB (ref 65–99)
GLUCOSE-CAPILLARY: 74 mg/dL (ref 65–99)
GLUCOSE-CAPILLARY: 177 mg/dL — AB (ref 65–99)
GLUCOSE-CAPILLARY: 69 mg/dL (ref 65–99)
GLUCOSE-CAPILLARY: 79 mg/dL (ref 65–99)
Glucose-Capillary: 236 mg/dL — ABNORMAL HIGH (ref 65–99)
Glucose-Capillary: 290 mg/dL — ABNORMAL HIGH (ref 65–99)
Glucose-Capillary: 331 mg/dL — ABNORMAL HIGH (ref 65–99)
Glucose-Capillary: 351 mg/dL — ABNORMAL HIGH (ref 65–99)
Glucose-Capillary: 365 mg/dL — ABNORMAL HIGH (ref 65–99)
Glucose-Capillary: 383 mg/dL — ABNORMAL HIGH (ref 65–99)
Glucose-Capillary: 390 mg/dL — ABNORMAL HIGH (ref 65–99)
Glucose-Capillary: 42 mg/dL — CL (ref 65–99)
Glucose-Capillary: 67 mg/dL (ref 65–99)
Glucose-Capillary: 72 mg/dL (ref 65–99)
Glucose-Capillary: 86 mg/dL (ref 65–99)
Glucose-Capillary: 95 mg/dL (ref 65–99)
Glucose-Capillary: 99 mg/dL (ref 65–99)

## 2017-10-31 LAB — CBC
HEMATOCRIT: 46.8 % (ref 40.0–52.0)
Hemoglobin: 15.6 g/dL (ref 13.0–18.0)
MCH: 31.1 pg (ref 26.0–34.0)
MCHC: 33.4 g/dL (ref 32.0–36.0)
MCV: 93.4 fL (ref 80.0–100.0)
Platelets: 151 10*3/uL (ref 150–440)
RBC: 5.01 MIL/uL (ref 4.40–5.90)
RDW: 12.4 % (ref 11.5–14.5)
WBC: 26.8 10*3/uL — ABNORMAL HIGH (ref 3.8–10.6)

## 2017-10-31 LAB — ECHOCARDIOGRAM COMPLETE
HEIGHTINCHES: 70 in
WEIGHTICAEL: 2814.83 [oz_av]

## 2017-10-31 LAB — RENAL FUNCTION PANEL
ALBUMIN: 2.5 g/dL — AB (ref 3.5–5.0)
ANION GAP: 17 — AB (ref 5–15)
ANION GAP: 14 (ref 5–15)
ANION GAP: 10 (ref 5–15)
Albumin: 2.4 g/dL — ABNORMAL LOW (ref 3.5–5.0)
Albumin: 2.5 g/dL — ABNORMAL LOW (ref 3.5–5.0)
Albumin: 2.5 g/dL — ABNORMAL LOW (ref 3.5–5.0)
Anion gap: 14 (ref 5–15)
BUN: 36 mg/dL — ABNORMAL HIGH (ref 6–20)
BUN: 33 mg/dL — AB (ref 6–20)
BUN: 29 mg/dL — ABNORMAL HIGH (ref 6–20)
BUN: 27 mg/dL — ABNORMAL HIGH (ref 6–20)
CALCIUM: 6.3 mg/dL — AB (ref 8.9–10.3)
CHLORIDE: 105 mmol/L (ref 101–111)
CHLORIDE: 104 mmol/L (ref 101–111)
CO2: 27 mmol/L (ref 22–32)
CO2: 27 mmol/L (ref 22–32)
CO2: 25 mmol/L (ref 22–32)
CO2: 28 mmol/L (ref 22–32)
CREATININE: 3.05 mg/dL — AB (ref 0.61–1.24)
Calcium: 6.5 mg/dL — ABNORMAL LOW (ref 8.9–10.3)
Calcium: 6.6 mg/dL — ABNORMAL LOW (ref 8.9–10.3)
Calcium: 6.7 mg/dL — ABNORMAL LOW (ref 8.9–10.3)
Chloride: 102 mmol/L (ref 101–111)
Chloride: 104 mmol/L (ref 101–111)
Creatinine, Ser: 3.48 mg/dL — ABNORMAL HIGH (ref 0.61–1.24)
Creatinine, Ser: 2.9 mg/dL — ABNORMAL HIGH (ref 0.61–1.24)
Creatinine, Ser: 2.95 mg/dL — ABNORMAL HIGH (ref 0.61–1.24)
GFR calc Af Amer: 26 mL/min — ABNORMAL LOW (ref >60)
GFR calc Af Amer: 30 mL/min — ABNORMAL LOW (ref >60)
GFR calc Af Amer: 32 mL/min — ABNORMAL LOW (ref >60)
GFR calc non Af Amer: 22 mL/min — ABNORMAL LOW (ref >60)
GFR calc non Af Amer: 28 mL/min — ABNORMAL LOW (ref >60)
GFR, EST AFRICAN AMERICAN: 32 mL/min — AB (ref >60)
GFR, EST NON AFRICAN AMERICAN: 26 mL/min — AB (ref >60)
GFR, EST NON AFRICAN AMERICAN: 27 mL/min — AB (ref >60)
GLUCOSE: 261 mg/dL — AB (ref 65–99)
GLUCOSE: 52 mg/dL — AB (ref 65–99)
Glucose, Bld: 70 mg/dL (ref 65–99)
Glucose, Bld: 74 mg/dL (ref 65–99)
PHOSPHORUS: 4.2 mg/dL (ref 2.5–4.6)
POTASSIUM: 5.5 mmol/L — AB (ref 3.5–5.1)
POTASSIUM: 5.8 mmol/L — AB (ref 3.5–5.1)
Phosphorus: 3.7 mg/dL (ref 2.5–4.6)
Phosphorus: 4.8 mg/dL — ABNORMAL HIGH (ref 2.5–4.6)
Phosphorus: 4.3 mg/dL (ref 2.5–4.6)
Potassium: 3.1 mmol/L — ABNORMAL LOW (ref 3.5–5.1)
Potassium: 3.9 mmol/L (ref 3.5–5.1)
SODIUM: 146 mmol/L — AB (ref 135–145)
SODIUM: 146 mmol/L — AB (ref 135–145)
SODIUM: 143 mmol/L (ref 135–145)
Sodium: 142 mmol/L (ref 135–145)

## 2017-10-31 LAB — BLOOD GAS, ARTERIAL
Acid-Base Excess: 3.8 mmol/L — ABNORMAL HIGH (ref 0.0–2.0)
Allens test (pass/fail): POSITIVE — AB
Bicarbonate: 29.7 mmol/L — ABNORMAL HIGH (ref 20.0–28.0)
FIO2: 80
FIO2: 0.7
O2 Saturation: 93.5 %
PATIENT TEMPERATURE: 37
PCO2 ART: 72 mmHg — AB (ref 32.0–48.0)
PCO2 ART: 48 mmHg (ref 32.0–48.0)
PEEP/CPAP: 5 cmH2O
PO2 ART: 82 mmHg — AB (ref 83.0–108.0)
Patient temperature: 37
RATE: 20 resp/min
RATE: 30 resp/min
VT: 450 mL
VT: 500 mL
pH, Arterial: 7.4 (ref 7.350–7.450)
pO2, Arterial: 69 mmHg — ABNORMAL LOW (ref 83.0–108.0)

## 2017-10-31 LAB — TROPONIN I
Troponin I: 6.44 ng/mL (ref <0.03)
Troponin I: 6.06 ng/mL (ref <0.03)

## 2017-10-31 LAB — CK

## 2017-10-31 LAB — PROTIME-INR
INR: 1.69
INR: 1.69
PROTHROMBIN TIME: 19.7 s — AB (ref 11.4–15.2)
Prothrombin Time: 19.7 seconds — ABNORMAL HIGH (ref 11.4–15.2)

## 2017-10-31 LAB — MAGNESIUM
MAGNESIUM: 1.8 mg/dL (ref 1.7–2.4)
MAGNESIUM: 1.6 mg/dL — AB (ref 1.7–2.4)
MAGNESIUM: 2 mg/dL (ref 1.7–2.4)
Magnesium: 5.1 mg/dL — ABNORMAL HIGH (ref 1.7–2.4)

## 2017-10-31 LAB — PROCALCITONIN: Procalcitonin: 24.55 ng/mL

## 2017-10-31 LAB — LACTIC ACID, PLASMA
Lactic Acid, Venous: 10.5 mmol/L (ref 0.5–1.9)
Lactic Acid, Venous: 12.3 mmol/L (ref 0.5–1.9)

## 2017-10-31 LAB — TYPE AND SCREEN
ABO/RH(D): A POS
ANTIBODY SCREEN: NEGATIVE

## 2017-10-31 LAB — FIBRINOGEN: FIBRINOGEN: 166 mg/dL — AB (ref 210–475)

## 2017-10-31 LAB — TRIGLYCERIDES: Triglycerides: 133 mg/dL (ref <150)

## 2017-10-31 MED ORDER — POTASSIUM CHLORIDE 10 MEQ/50ML IV SOLN
10.0000 meq | INTRAVENOUS | Status: AC
  Administered 2017-10-31 (×2): 10 meq via INTRAVENOUS
  Filled 2017-10-31 (×2): qty 50

## 2017-10-31 MED ORDER — NOREPINEPHRINE BITARTRATE 1 MG/ML IV SOLN
0.0000 ug/min | INTRAVENOUS | Status: DC
  Administered 2017-10-31: 20 ug/min via INTRAVENOUS
  Administered 2017-10-31: 23 ug/min via INTRAVENOUS
  Administered 2017-11-04: 2 ug/min via INTRAVENOUS
  Filled 2017-10-31 (×4): qty 16

## 2017-10-31 MED ORDER — MAGNESIUM SULFATE 2 GM/50ML IV SOLN
2.0000 g | Freq: Once | INTRAVENOUS | Status: AC
  Administered 2017-10-31: 2 g via INTRAVENOUS
  Filled 2017-10-31: qty 50

## 2017-10-31 MED ORDER — VANCOMYCIN HCL IN DEXTROSE 1-5 GM/200ML-% IV SOLN
1000.0000 mg | INTRAVENOUS | Status: DC
  Administered 2017-10-31: 1000 mg via INTRAVENOUS
  Filled 2017-10-31 (×2): qty 200

## 2017-10-31 MED ORDER — POTASSIUM CHLORIDE 10 MEQ/100ML IV SOLN
10.0000 meq | INTRAVENOUS | Status: DC
  Administered 2017-10-31: 10 meq via INTRAVENOUS
  Filled 2017-10-31 (×4): qty 100

## 2017-10-31 MED ORDER — PROPOFOL 1000 MG/100ML IV EMUL
5.0000 ug/kg/min | INTRAVENOUS | Status: DC
  Administered 2017-10-31 – 2017-11-01 (×2): 25 ug/kg/min via INTRAVENOUS
  Administered 2017-11-01 – 2017-11-02 (×5): 35 ug/kg/min via INTRAVENOUS
  Administered 2017-11-03: 30 ug/kg/min via INTRAVENOUS
  Administered 2017-11-03 (×3): 35 ug/kg/min via INTRAVENOUS
  Administered 2017-11-04 (×2): 30 ug/kg/min via INTRAVENOUS
  Administered 2017-11-04 (×2): 35 ug/kg/min via INTRAVENOUS
  Administered 2017-11-05: 40 ug/kg/min via INTRAVENOUS
  Administered 2017-11-05: 25 ug/kg/min via INTRAVENOUS
  Administered 2017-11-05: 40 ug/kg/min via INTRAVENOUS
  Administered 2017-11-06: 35 ug/kg/min via INTRAVENOUS
  Filled 2017-10-31 (×22): qty 100

## 2017-10-31 MED ORDER — PUREFLOW DIALYSIS SOLUTION
INTRAVENOUS | Status: DC
  Administered 2017-10-31: 22:00:00 via INTRAVENOUS_CENTRAL

## 2017-10-31 MED ORDER — DEXTROSE-NACL 5-0.45 % IV SOLN: INTRAVENOUS | Status: DC

## 2017-10-31 MED ORDER — PUREFLOW DIALYSIS SOLUTION
INTRAVENOUS | Status: DC
  Administered 2017-10-31: 12:00:00 via INTRAVENOUS_CENTRAL

## 2017-10-31 MED ORDER — MIDAZOLAM HCL 2 MG/2ML IJ SOLN
2.0000 mg | Freq: Once | INTRAMUSCULAR | Status: AC
  Administered 2017-10-31: 2 mg via INTRAVENOUS
  Filled 2017-10-31: qty 2

## 2017-10-31 MED ORDER — POTASSIUM CHLORIDE 10 MEQ/100ML IV SOLN
10.0000 meq | Freq: Once | INTRAVENOUS | Status: AC
  Administered 2017-10-31: 10 meq via INTRAVENOUS
  Filled 2017-10-31: qty 100

## 2017-10-31 MED ORDER — HEPARIN SODIUM (PORCINE) 1000 UNIT/ML DIALYSIS
1000.0000 [IU] | INTRAMUSCULAR | Status: DC | PRN
  Administered 2017-11-02: 2600 [IU] via INTRAVENOUS_CENTRAL
  Filled 2017-10-31 (×2): qty 6
  Filled 2017-10-31: qty 4

## 2017-10-31 MED ORDER — SODIUM CHLORIDE 0.9 % IV SOLN
Freq: Once | INTRAVENOUS | Status: AC
  Administered 2017-10-31: 04:00:00 via INTRAVENOUS

## 2017-10-31 NOTE — Progress Notes (Signed)
[facility] Critical Care Medicine Progess Note    SYNOPSIS   27 year old gentleman ith a past medical history of polysubstance abuse, status post prolonged cardiac arrest, respiratory failure, renal failure, rhabdomyolysis, shock  ASSESSMENT/PLAN   Status post cardiac arrest. Patient unknown downtime prior to EMS arriving with prolonged CPR in emergency department. Ventricular fibrillation was noted, we'll place on targeted temperature management, hypothermia protocol and 36. EEG pending,  Patient does startle when you call his name but nothing purposeful as of yet we'll continue targeted temperature management at this time.  Polysubstance abuse. Known history of drug abuse. Per friends and family he snorts his drugs and are not taken IV area urine drug screen was positive for cocaine and opiates  Renal failure. Pending laboratory studies from this morning however patient is making minimal amount of urine, discuss with nephrology will start CRRT Will place catheter  Rhabdomyolysis. Rhabdomyolysis with renal failure. Elevated CK, on bicarbonate infusion, CK is now greater than 50,000,will start CRRT  Leukocytosis. Elevated white count has improved to 26.8 Place on broad-spectrum antibiotic coverage  Severe acidosis. Presently on bicarbonate infusion last pH was 7.22. Pending CRRT  Critical care time 40 minutes  VENTILATOR SETTINGS: Vent Mode: PRVC FiO2 (%):  [80 %-100 %] 80 % Set Rate:  [20 bmp-30 bmp] 30 bmp Vt Set:  [450 mL-500 mL] 500 mL PEEP:  [5 cmH20] 5 cmH20  HEMODYNAMICS: CVP:  [10 mmHg] 10 mmHg  INTAKE / OUTPUT:  Intake/Output Summary (Last 24 hours) at 10/31/2017 0818 Last data filed at 10/31/2017 0645 Gross per 24 hour  Intake 21286.12 ml  Output 600 ml  Net 20686.12 ml    Name: Joshua [name] MRN: [id number] DOB: [birth year]    ADMISSION DATE:  10/30/2017  SUBJECTIVE:   Overnight patient has remained on vasopressin, norepinephrine, epinephrine, bicarbonate  infusion, mechanical ventilation. Right leg still looks mottled Patient remains on hypothermia protocol. This morning patient does startle when you call his name but nothing purposefully performed  VITAL SIGNS: Temp:  [85.2 F (29.6 C)-97.5 F (36.4 C)] 97.2 F (36.2 C) (03/09 0700) Pulse Rate:  [36-156] 106 (03/09 0700) Resp:  [20-39] 30 (03/09 0700) BP: (32-160)/(22-146) 160/99 (03/09 0700) SpO2:  [34 %-100 %] 100 % (03/09 0700) FiO2 (%):  [80 %-100 %] 80 % (03/09 0420) Weight:  [74.8 kg (165 lb)-79.8 kg (175 lb 14.8 oz)] 79.8 kg (175 lb 14.8 oz) (03/08 1800)  PHYSICAL EXAMINATION: Physical Examination:   VS: BP (!) 160/99   Pulse (!) 106   Temp (!) 97.2 F (36.2 C) (Bladder)   Resp (!) 30   Ht 5\' 10"  (1.778 m)   Wt 79.8 kg (175 lb 14.8 oz)   SpO2 100%   BMI 25.24 kg/m   General Appearance: critically ill unresponsive in the intensive care unit Neuro:patient does startle when you call his name HEENT:pupils dilated and sluggish to respond, orally intubated, oral gastric tube, trachea is midline Pulmonary: coarse rhonchi appreciated CardiovascularNormal S1,S2.  No m/r/g.   Abdomen: soft exam, positive bowel sounds Extremities: patient's right lower extremity ecchymosis noted, pulses appreciated popliteal but no dorsalis pedisor posterior tibial.  LABORATORY PANEL:   CBC Recent Labs  Lab 10/31/17 0454  WBC 26.8*  HGB 15.6  HCT 46.8  PLT 151    Chemistries  Recent Labs  Lab 10/30/17 1858 10/30/17 2132  NA 150* 150*  K 4.5 4.1  CL 105 101  CO2 19* 27  GLUCOSE 254* 418*  BUN 24* 29*  CREATININE  2.63* 2.74*  CALCIUM 7.0* 6.0*  MG 2.8*  --   PHOS 16.0*  --   AST  --  1,027*  ALT  --  474*  ALKPHOS  --  44  BILITOT  --  1.8*    Recent Labs  Lab 10/31/17 0141 10/31/17 0254 10/31/17 0416 10/31/17 0531 10/31/17 0624 10/31/17 0729  GLUCAP 377* 365* 366* 351* 390* 383*   Recent Labs  Lab 10/30/17 1744 10/30/17 1951 10/30/17 2300  PHART  --  7.09*  7.22*  PCO2ART 72* 56* 56*  PO2ART 82* 67* 75*   Recent Labs  Lab 10/30/17 1204 10/30/17 2132  AST 316* 1,027*  ALT 233* 474*  ALKPHOS 84 44  BILITOT 1.4* 1.8*  ALBUMIN 4.9 2.2*    Cardiac Enzymes Recent Labs  Lab 10/31/17 0216  TROPONINI 6.44*    RADIOLOGY:  Ct Head Wo Contrast  Result Date: 10/30/2017 CLINICAL DATA:  Patient found unresponsive today. Possible drug overdose. EXAM: CT HEAD WITHOUT CONTRAST TECHNIQUE: Contiguous axial images were obtained from the base of the skull through the vertex without intravenous contrast. COMPARISON:  None. FINDINGS: Brain: No evidence of acute infarction, hemorrhage, hydrocephalus, extra-axial collection or mass lesion/mass effect. Vascular: No hyperdense vessel or unexpected calcification. Skull: Intact. Sinuses/Orbits: Scattered ethmoid air cell disease is noted. Minimal mucosal thickening left sphenoid sinus is seen. Other: None. IMPRESSION: No acute abnormality. Mild left sphenoid sinus and scattered ethmoid air cell disease. Electronically Signed   By: Drusilla Kannerhomas  Dalessio M.D.   On: 10/30/2017 15:20   Dg Chest Port 1 View  Result Date: 10/31/2017 CLINICAL DATA:  Intubation, overdose EXAM: PORTABLE CHEST 1 VIEW COMPARISON:  10/30/2017 FINDINGS: Support devices are stable. Areas of bilateral airspace opacities, improved in the upper lobes since prior study. Continued left perihilar and lower lobe airspace opacity could reflect infection or aspiration. No pneumothorax. No visible significant effusions. No acute bony abnormality. IMPRESSION: Improving bilateral upper lobe airspace opacities. Continued mild left perihilar and lower lung airspace opacity which could reflect pneumonia or aspiration. Electronically Signed   By: Charlett NoseKevin  Dover M.D.   On: 10/31/2017 07:41   Dg Chest Port 1 View  Result Date: 10/30/2017 CLINICAL DATA:  S/p central line placement and OG and ETT. Hx of asthma and bronchitis EXAM: PORTABLE CHEST 1 VIEW COMPARISON:  10/30/2017  at 12:25 hours FINDINGS: Endotracheal tube has been retracted, tip now well-positioned, 15 mm above the carina. Orogastric tube passes below the diaphragm into the stomach. Left internal jugular central venous line has its tip in the lower superior vena cava. Hazy airspace opacity is noted in both upper lungs and in the medial lower lungs, left greater than right, mildly increased when compared to the prior study. No pneumothorax. IMPRESSION: 1. Endotracheal tube, nasal/orogastric tube and left internal jugular central venous line are all well positioned as detailed above. 2. No pneumothorax. 3. Mild increase in hazy airspace opacities. This may reflect asymmetric edema or multi lobar pneumonia. Electronically Signed   By: Amie Portlandavid  Ormond M.D.   On: 10/30/2017 18:58   Dg Chest Portable 1 View  Result Date: 10/30/2017 CLINICAL DATA:  Endotracheal tube placement. EXAM: PORTABLE CHEST 1 VIEW COMPARISON:  Radiographs of June 05, 2015. FINDINGS: The heart size and mediastinal contours are within normal limits. Endotracheal tube is seen directed into left mainstem bronchus; withdrawal by 3-4 cm is recommended. Mild bilateral perihilar edema is noted. No pneumothorax is noted. No pleural effusion is noted. The visualized skeletal structures are unremarkable. IMPRESSION: Endotracheal  tube directed into left mainstem bronchus; withdrawal by 3-4 cm is recommended. Critical Value/emergent results were called by telephone at the time of interpretation on 10/30/2017 at 12:50 pm to Dr. Sharman Cheek , who verbally acknowledged these results. Mild bilateral perihilar edema is noted. Electronically Signed   By: Lupita Raider, M.D.   On: 10/30/2017 12:51   Dg Abd Portable 1v  Result Date: 10/30/2017 CLINICAL DATA:  Orogastric tube placement. EXAM: PORTABLE ABDOMEN - 1 VIEW COMPARISON:  None. FINDINGS: Orogastric tube extends below the diaphragm into the mid stomach, well positioned. Stomach is moderately distended with  air. No bowel dilation to suggest obstruction. IMPRESSION: Well-positioned orogastric tube within the mid stomach. Electronically Signed   By: Amie Portland M.D.   On: 10/30/2017 18:59    Tora Kindred, DO  10/31/2017

## 2017-10-31 NOTE — Consult Note (Signed)
[facility] Consult Note  MRN : [id number]  Joshua [name] is a 27 y.o. (1991/04/08) male who presents with chief complaint of  Chief Complaint  Patient presents with  . Drug Overdose   History of Present Illness:  Unable to interview patient as he is sedated and on vent.  Information for this consult was derived from previous EPIC notes.  The patient is a 28 year old male with a past medical history of a substance drug abuse found down for an unknown amount of time in a hotel room.  Patient brought into [facility] ED and underwent 45 minutes of CPR for V. fib arrest.  Toxicology screen positive for cocaine and opiates.  Patient placed on hypothermia protocol of 36 degrees.  Patient in acute renal failure with rhabdomyolysis and severe acidosis, dialysis catheter placed this morning by ICU team and CRRT started.   Patient has been seen by cardiology, echo is pending. Patient has been seen by neurology, EEG is pending.  CTA of the head without contrast yesterday without acute abnormality.  As per the patient's nurse, he has become progressively more alert now responding to his name and trying to speak to family members at the bedside.  Vascular surgery was consulted due to right lower extremity ischemia and possible compartment syndrome.   Current Facility-Administered Medications  Medication Dose Route Frequency Provider Last Rate Last Dose  . 0.9 %  sodium chloride infusion   Intravenous Continuous Mikael Spray, NP   Stopped at 10/30/17 2135  . Ampicillin-Sulbactam (UNASYN) 3 g in sodium chloride 0.9 % 100 mL IVPB  3 g Intravenous Q6H Conforti, John, DO   Stopped at 10/31/17 1316  . bisacodyl (DULCOLAX) suppository 10 mg  10 mg Rectal Daily PRN Dorene Sorrow S, NP      . chlorhexidine gluconate (MEDLINE KIT) (PERIDEX) 0.12 % solution 15 mL  15 mL Mouth Rinse BID Conforti, John, DO   15 mL at 10/31/17 0845  . dextrose 5 %-0.45  % sodium chloride infusion   Intravenous Continuous Tukov, Magadalene S, NP      . dextrose 50 % solution 25 mL  25 mL Intravenous PRN Tukov, Magadalene S, NP      . docusate sodium (COLACE) capsule 100 mg  100 mg Oral BID PRN Vaughan Basta, MD      . EPINEPHrine (ADRENALIN) 4 mg in dextrose 5 % 250 mL (0.016 mg/mL) infusion  0.5-20 mcg/min Intravenous Titrated Carrie Mew, MD   Stopped at 10/31/17 0901  . famotidine (PEPCID) IVPB 20 mg premix  20 mg Intravenous Q24H Conforti, John, DO   Stopped at 10/30/17 2333  . fentaNYL (SUBLIMAZE) bolus via infusion 50-100 mcg  50-100 mcg Intravenous Q1H PRN Tukov, Magadalene S, NP      . fentaNYL 2573mg in NS 2571m(1048mml) infusion-PREMIX  25-400 mcg/hr Intravenous Continuous TukDorene Sorrow NP 17.5 mL/hr at 10/31/17 0942 175 mcg/hr at 10/31/17 0942  . heparin injection 1,000-6,000 Units  1,000-6,000 Units CRRT PRN SinMurlean IbaD      . insulin regular (NOVOLIN R,HUMULIN R) 100 Units in sodium chloride 0.9 % 100 mL (1 Units/mL) infusion   Intravenous Continuous Tukov, Magadalene S, NP 1.4 mL/hr at 10/31/17 1426 1.4 Units/hr at 10/31/17 1426  . insulin regular (NOVOLIN R,HUMULIN R) 100 units/mL injection 15 Units  15 Units Subcutaneous Once Tukov, Magadalene S, NP      . insulin regular bolus via infusion 0-10 Units  0-10 Units  Intravenous TID WC Tukov, Magadalene S, NP      . magnesium sulfate IVPB 2 g 50 mL  2 g Intravenous Once Gladstone Lighter, MD      . MEDLINE mouth rinse  15 mL Mouth Rinse 10 times per day Conforti, John, DO   15 mL at 10/31/17 1430  . midazolam (VERSED) injection 2 mg  2 mg Intravenous Q15 min PRN Dorene Sorrow S, NP      . midazolam (VERSED) injection 2 mg  2 mg Intravenous Q2H PRN Mikael Spray, NP   2 mg at 10/30/17 2223  . norepinephrine (LEVOPHED) 16 mg in dextrose 5 % 250 mL (0.064 mg/mL) infusion  0-40 mcg/min Intravenous Titrated Tukov, Magadalene S, NP 28.1 mL/hr at 10/31/17 1047 30 mcg/min  at 10/31/17 1047  . pantoprazole (PROTONIX) injection 40 mg  40 mg Intravenous QHS Dorene Sorrow S, NP   40 mg at 10/30/17 2242  . potassium chloride 10 mEq in 50 mL *CENTRAL LINE* IVPB  10 mEq Intravenous Q1 Hr x 2 Gladstone Lighter, MD      . pureflow IV solution for Dialysis   CRRT Continuous Murlean Iba, MD 2,000 mL/hr at 10/31/17 1200    . sennosides (SENOKOT) 8.8 MG/5ML syrup 5 mL  5 mL Per Tube BID PRN Tukov, Magadalene S, NP      . sodium chloride flush (NS) 0.9 % injection 10-40 mL  10-40 mL Intracatheter Q12H Conforti, John, DO   20 mL at 10/31/17 1000  . sodium chloride flush (NS) 0.9 % injection 10-40 mL  10-40 mL Intracatheter PRN Conforti, John, DO      . vancomycin (VANCOCIN) IVPB 1000 mg/200 mL premix  1,000 mg Intravenous Q24H Lifsey, Betti Cruz, RPH      . vasopressin (PITRESSIN) 40 Units in sodium chloride 0.9 % 250 mL (0.16 Units/mL) infusion  0.04 Units/min Intravenous Continuous Tukov, Magadalene S, NP 15 mL/hr at 10/31/17 0600 0.04 Units/min at 10/31/17 0600   Past Medical History:  Diagnosis Date  . Asthma   . Bronchitis   . Hernia cerebri [facility])    Past Surgical History:  Procedure Laterality Date  . ankle/foot surgery with metal plates     Social History Social History   Tobacco Use  . Smoking status: Current Every Day Smoker    Packs/day: 0.50    Types: Cigarettes  . Smokeless tobacco: Never Used  Substance Use Topics  . Alcohol use: No  . Drug use: No   Family History No family history on file.  On able to provide a family history as the patient is sedated and on the vent  No Known Allergies  REVIEW OF SYSTEMS (Negative unless checked)  Unable to provide a review of systems as the patient is sedated and on a vent  Constitutional: []Weight loss  []Fever  []Chills Cardiac: []Chest pain   []Chest pressure   []Palpitations   []Shortness of breath when laying flat   []Shortness of breath at rest   []Shortness of breath with exertion. Vascular:   []Pain in legs with walking   []Pain in legs at rest   []Pain in legs when laying flat   []Claudication   []Pain in feet when walking  []Pain in feet at rest  []Pain in feet when laying flat   []History of DVT   []Phlebitis   []Swelling in legs   []Varicose veins   []Non-healing ulcers Pulmonary:   []Uses home oxygen   []Productive cough   []Hemoptysis   []  Wheeze  []COPD   []Asthma Neurologic:  []Dizziness  []Blackouts   []Seizures   []History of stroke   []History of TIA  []Aphasia   []Temporary blindness   []Dysphagia   []Weakness or numbness in arms   []Weakness or numbness in legs Musculoskeletal:  []Arthritis   []Joint swelling   []Joint pain   []Low back pain Hematologic:  []Easy bruising  []Easy bleeding   []Hypercoagulable state   []Anemic  []Hepatitis Gastrointestinal:  []Blood in stool   []Vomiting blood  []Gastroesophageal reflux/heartburn   []Difficulty swallowing. Genitourinary:  []Chronic kidney disease   []Difficult urination  []Frequent urination  []Burning with urination   []Blood in urine Skin:  []Rashes   []Ulcers   []Wounds Psychological:  []History of anxiety   [] History of major depression.  Physical Examination  Vitals:   10/31/17 0900 10/31/17 1120 10/31/17 1200 10/31/17 1300  BP: (!) 159/96  103/75   Pulse: 95   96  Resp: (!) 30  (!) 30 (!) 30  Temp:      TempSrc:      SpO2: 93% 94%  100%  Weight:      Height:       Body mass index is 25.24 kg/m. Gen: Patient is sedated and on a vent.  Patient does flutter his eyes when you call his name. Head: [place]/AT, No temporalis wasting. Prominent temp pulse not noted. Ear/Nose/Throat: Nares w/o erythema or drainage, oropharynx w/o Erythema/Exudate Eyes: Sclera non-icteric, conjunctiva clear Neck: Trachea midline.  No JVD.  Pulmonary:  Good air movement, respirations not labored, equal bilaterally.  Cardiac: RRR, normal S1, S2. Vascular:  Vessel Right Left  Radial Palpable Palpable  Ulnar Palpable Palpable  Brachial  Palpable Palpable  Carotid Palpable, without bruit Palpable, without bruit  Aorta Not palpable N/A  Femoral Palpable Palpable  Popliteal  nonpalpable Palpable  PT  nonpalpable Palpable  DP  nonpalpable Palpable   Right lower extremity: Mild bleeding is noted from toes approximately 2 mid calf.  There is a streaking of mottling approximately located on the back of the leg to approximately mid thigh.  The extremity is cold.  Pedal pulses are nonpalpable.  Mild to moderate edema noted.  Skin is intact.  Gastrointestinal: soft, non-tender/non-distended. No guarding/reflex.  Musculoskeletal: No deformity or atrophy.  Neurologic: As above Psychiatric: Patient is sedated and on vent Dermatologic: No rashes or ulcers noted.  No cellulitis or open wounds. Lymph : No Cervical, Axillary, or Inguinal lymphadenopathy.  CBC Lab Results  Component Value Date   WBC 26.8 (H) 10/31/2017   HGB 15.6 10/31/2017   HCT 46.8 10/31/2017   MCV 93.4 10/31/2017   PLT 151 10/31/2017   BMET    Component Value Date/Time   NA 146 (H) 10/31/2017 1419   NA 141 05/08/2014 2307   K 3.9 10/31/2017 1419   K 4.0 05/08/2014 2307   CL 105 10/31/2017 1419   CL 103 05/08/2014 2307   CO2 27 10/31/2017 1419   CO2 31 05/08/2014 2307   GLUCOSE 70 10/31/2017 1419   GLUCOSE 95 05/08/2014 2307   BUN 33 (H) 10/31/2017 1419   BUN 19 (H) 05/08/2014 2307   CREATININE 3.05 (H) 10/31/2017 1419   CREATININE 1.02 05/08/2014 2307   CALCIUM 6.5 (L) 10/31/2017 1419   CALCIUM 9.6 05/08/2014 2307   GFRNONAA 26 (L) 10/31/2017 1419   GFRNONAA >60 05/08/2014 2307   GFRAA 30 (L) 10/31/2017 1419   GFRAA >60 05/08/2014 2307   Estimated Creatinine Clearance:  37.2 mL/min (A) (by C-G formula based on SCr of 3.05 mg/dL (H)).  COAG Lab Results  Component Value Date   INR 1.69 10/31/2017   INR 3.17 10/30/2017   INR 2.91 10/30/2017   Radiology Dg Knee 2 Views Right  Result Date: 10/03/2017 CLINICAL DATA:  Chronic pain EXAM: RIGHT  KNEE - 1-2 VIEW COMPARISON:  None. FINDINGS: Frontal and lateral views were obtained. No evident fracture or dislocation. No appreciable joint effusion. Joint spaces appear normal. No erosive change. IMPRESSION: No fracture or joint effusion.  No appreciable arthropathy. Electronically Signed   By: Lowella Grip III M.D.   On: 10/03/2017 14:54   Ct Head Wo Contrast  Result Date: 10/30/2017 CLINICAL DATA:  Patient found unresponsive today. Possible drug overdose. EXAM: CT HEAD WITHOUT CONTRAST TECHNIQUE: Contiguous axial images were obtained from the base of the skull through the vertex without intravenous contrast. COMPARISON:  None. FINDINGS: Brain: No evidence of acute infarction, hemorrhage, hydrocephalus, extra-axial collection or mass lesion/mass effect. Vascular: No hyperdense vessel or unexpected calcification. Skull: Intact. Sinuses/Orbits: Scattered ethmoid air cell disease is noted. Minimal mucosal thickening left sphenoid sinus is seen. Other: None. IMPRESSION: No acute abnormality. Mild left sphenoid sinus and scattered ethmoid air cell disease. Electronically Signed   By: Inge Rise M.D.   On: 10/30/2017 15:20   Dg Chest Port 1 View  Result Date: 10/31/2017 CLINICAL DATA:  Dialysis catheter placement. Overdose. Asthma bronchitis. EXAM: PORTABLE CHEST 1 VIEW COMPARISON:  Earlier today at 0718 hours. FINDINGS: 1044 hours. Right internal jugular dialysis catheter tip at low SVC. Left internal jugular line unchanged. Endotracheal tube 5.2 cm above carina. Nasogastric tube extends beyond the inferior aspect of the film. Normal heart size for level of inspiration. No pleural effusion or pneumothorax. Lower lobe predominant interstitial and airspace disease is primarily similar. Felt to be slightly increased in the right infrahilar region. IMPRESSION: Right internal jugular dialysis catheter tip at low SVC; no pneumothorax. Slight worsening right infrahilar aeration. Otherwise similar pulmonary  edema and/or infection. Electronically Signed   By: Abigail Miyamoto M.D.   On: 10/31/2017 11:14   Dg Chest Port 1 View  Result Date: 10/31/2017 CLINICAL DATA:  Intubation, overdose EXAM: PORTABLE CHEST 1 VIEW COMPARISON:  10/30/2017 FINDINGS: Support devices are stable. Areas of bilateral airspace opacities, improved in the upper lobes since prior study. Continued left perihilar and lower lobe airspace opacity could reflect infection or aspiration. No pneumothorax. No visible significant effusions. No acute bony abnormality. IMPRESSION: Improving bilateral upper lobe airspace opacities. Continued mild left perihilar and lower lung airspace opacity which could reflect pneumonia or aspiration. Electronically Signed   By: Rolm Baptise M.D.   On: 10/31/2017 07:41   Dg Chest Port 1 View  Result Date: 10/30/2017 CLINICAL DATA:  S/p central line placement and OG and ETT. Hx of asthma and bronchitis EXAM: PORTABLE CHEST 1 VIEW COMPARISON:  10/30/2017 at 12:25 hours FINDINGS: Endotracheal tube has been retracted, tip now well-positioned, 15 mm above the carina. Orogastric tube passes below the diaphragm into the stomach. Left internal jugular central venous line has its tip in the lower superior vena cava. Hazy airspace opacity is noted in both upper lungs and in the medial lower lungs, left greater than right, mildly increased when compared to the prior study. No pneumothorax. IMPRESSION: 1. Endotracheal tube, nasal/orogastric tube and left internal jugular central venous line are all well positioned as detailed above. 2. No pneumothorax. 3. Mild increase in hazy airspace opacities. This may  reflect asymmetric edema or multi lobar pneumonia. Electronically Signed   By: Lajean Manes M.D.   On: 10/30/2017 18:58   Dg Chest Portable 1 View  Result Date: 10/30/2017 CLINICAL DATA:  Endotracheal tube placement. EXAM: PORTABLE CHEST 1 VIEW COMPARISON:  Radiographs of June 05, 2015. FINDINGS: The heart size and mediastinal  contours are within normal limits. Endotracheal tube is seen directed into left mainstem bronchus; withdrawal by 3-4 cm is recommended. Mild bilateral perihilar edema is noted. No pneumothorax is noted. No pleural effusion is noted. The visualized skeletal structures are unremarkable. IMPRESSION: Endotracheal tube directed into left mainstem bronchus; withdrawal by 3-4 cm is recommended. Critical Value/emergent results were called by telephone at the time of interpretation on 10/30/2017 at 12:50 pm to Dr. Carrie Mew , who verbally acknowledged these results. Mild bilateral perihilar edema is noted. Electronically Signed   By: Marijo Conception, M.D.   On: 10/30/2017 12:51   Dg Abd Portable 1v  Result Date: 10/30/2017 CLINICAL DATA:  Orogastric tube placement. EXAM: PORTABLE ABDOMEN - 1 VIEW COMPARISON:  None. FINDINGS: Orogastric tube extends below the diaphragm into the mid stomach, well positioned. Stomach is moderately distended with air. No bowel dilation to suggest obstruction. IMPRESSION: Well-positioned orogastric tube within the mid stomach. Electronically Signed   By: Lajean Manes M.D.   On: 10/30/2017 18:59   Assessment/Plan The patient is a 27 year old male who was found down in a hotel room for a unknown amount of time.  Patient underwent approximately 45 minutes of CPR for A. fib cardiac arrest.  Patient's toxicology screen was positive for cocaine and opiates. Patient is now in acute renal failure with rhabdomyolysis and severe acidosis. The patient continues to be sedated and vented. 1.  Right lower extremity ischemia: Patient with ischemia noted to the right lower extremity. Most likely from trauma). Mottled appearance to the right lower extremity on exam however there is some improvement when compared to yesterday.  No palpable pulses.  The extremity is cold however there is also improvement. Patient is improving in general. At this time, no indication for knee articulation. Will continue  to monitor - possible BKA if continues to improve as patient will most likely not have any function to right lower extremity.   Discussed with Dr. Francene Castle, PA-C  10/31/2017 3:31 PM  This note was created with Dragon medical transcription system.  Any error is purely unintentional

## 2017-10-31 NOTE — Progress Notes (Signed)
PT continues to arouse to verbal stimuli.  He is tracking with his eyes, and responds when I call his name.  He is mouthing sentences around the ET tube.  Denies pain.  Visitors at bedside.  No DP or PT pulse doplarable in RLE.

## 2017-10-31 NOTE — Progress Notes (Signed)
Pt has remained unresponsive for majority of shift, with pupils unreactive,  7mm bilaterally.  Around 0630, pt opens eyes to voice, doesn't follow commands.  Pt remains at goal targeted temperature of 36 C, and to remain at 36 C until 2100 tonight.  NSR on telemetry, remains NPO with OG to Low intermittent suction.  Remains on Levophed, Epinephrine, and Vasopressin gtt's to maintain MAP >65.  Foley has produced 100 ml of urine since 0000.  Pt received 2 unit's of FFP without any adverse reaction.

## 2017-10-31 NOTE — Progress Notes (Signed)
ELECTROLYTE CONSULT  Pharmacy Consult for electrolyte management  Indication: hypokalemia  No Known Allergies  Patient Measurements: Height: 5\' 10"  (177.8 cm) Weight: 175 lb 14.8 oz (79.8 kg) IBW/kg (Calculated) : 73   Labs: Recent Labs    10/30/17 1204 10/30/17 1858 10/30/17 2132 10/31/17 0454 10/31/17 0811 10/31/17 1031  WBC 49.5* 39.5*  --  26.8*  --   --   HGB 17.1 16.3  --  15.6  --   --   HCT 55.7* 50.9  --  46.8  --   --   PLT 282 214  --  151  --   --   APTT  --   --  45*  --   --   --   CREATININE 2.96* 2.63* 2.74*  --  3.45* 3.48*  MG  --  2.8*  --   --   --  1.8  PHOS  --  16.0*  --   --   --  4.2  ALBUMIN 4.9  --  2.2*  --  2.6* 2.5*  PROT 8.1  --  3.8*  --  4.9*  --   AST 316*  --  1,027*  --  1,662*  --   ALT 233*  --  474*  --  659*  --   ALKPHOS 84  --  44  --  39  --   BILITOT 1.4*  --  1.8*  --  1.4*  --    Estimated Creatinine Clearance: 32.6 mL/min (A) (by C-G formula based on SCr of 3.48 mg/dL (H)).  Sodium (mmol/L)  Date Value  10/31/2017 146 (H)  05/08/2014 141   Potassium (mmol/L)  Date Value  10/31/2017 3.1 (L)  05/08/2014 4.0   Magnesium (mg/dL)  Date Value  16/10/960403/04/2018 1.8   Calcium (mg/dL)  Date Value  54/09/811903/04/2018 6.3 (LL)   Calcium, Total (mg/dL)  Date Value  14/78/295609/14/2015 9.6   Albumin (g/dL)  Date Value  21/30/865703/04/2018 2.5 (L)  05/08/2014 4.1    Assessment: 27 yo male admitted with cardiac arrest and found to have ventricular fibrillation. Patient has known history of polysubstance abuse and his drug screen was positive for cocaine and opioids. Patient is critically ill and has acute renal failure. Patient is on hypothermia protocol and will be started on CRRT after dialysis catheter placement. Pharmacy has been consulted to monitor and replace electrolytes. Electrolyte replacement should be done cautiously as patient is critically ill and on hypothermia protocol.   Goal of Therapy:  K > 4 Mg > 2   Plan:  AM K = 2.7 >>  3.1 prior to first K 10mEq being given. Will replace with K 10mEq x 2 doses. Patient is being started on CRRT. Pharmacy will continue to follow and adjust.   Mg add on from AM resulted as 1.8. As is near goal will continue to monitor and before replacing. Patient has Mg labs ordered Q4H.   Yolanda BonineHannah Lifsey, PharmD Pharmacy Resident 10/31/2017,11:48 AM

## 2017-10-31 NOTE — Progress Notes (Signed)
Spoke with Dr. Lonn Georgiaonforti about patients blood sugar.  Repeat 176 mg/dl.  He reports to continue to hold insulin, and start D5 0.45% NS at 6850ml/hr.  Order entered, and started at this time.

## 2017-10-31 NOTE — Progress Notes (Signed)
Vascular surgeon at bedside to see patient and discuss plan with sister.  Plan for now is to continue to watch RLE, mild improvement noted by surgeon.  Will reassess tomorrow.

## 2017-10-31 NOTE — Progress Notes (Signed)
Pt blood sugar found to be 46mg /dl.  1 amp D50 given.  MD conforti made aware.

## 2017-10-31 NOTE — Progress Notes (Addendum)
[facility] [place] [facility]   PATIENT NAME: Joshua [name]    MR#:  [id number]  DATE OF BIRTH:  08/09/1991  SUBJECTIVE:  CHIEF COMPLAINT:   Chief Complaint  Patient presents with  . Drug Overdose   - brought in after found unresponsive in hotel room doing drugs - Urine tox with cocaine and opioid positive - Intubated and sedated. On hypothermia protocol  REVIEW OF SYSTEMS:  Review of Systems  Unable to perform ROS: Critical illness    DRUG ALLERGIES:  No Known Allergies  VITALS:  Blood pressure (!) 160/99, pulse (!) 106, temperature (!) 97.2 F (36.2 C), temperature source Bladder, resp. rate (!) 30, height 5\' 10"  (1.778 m), weight 79.8 kg (175 lb 14.8 oz), SpO2 100 %.  PHYSICAL EXAMINATION:  Physical Exam  GENERAL:  27 y.o.-year-old patient lying in the bed, critically ill appearing EYES: Pupils equal, round, dilated, reactive to light and accommodation. No scleral icterus. Extraocular muscles intact. Swollen eyelids noted HEENT: Head atraumatic, normocephalic. Oropharynx and nasopharynx clear.  NECK:  Supple, no jugular venous distention. No thyroid enlargement, no tenderness.  LUNGS: Normal breath sounds bilaterally, no wheezing, rales,rhonchi or crepitation. No use of accessory muscles of respiration.  CARDIOVASCULAR: S1, S2 normal. No murmurs, rubs, or gallops.  ABDOMEN: Soft, nontender, nondistended. Bowel sounds present. No organomegaly or mass.  EXTREMITIES: Right lower extremity is erythematous especially at the foot extremely cyanotic. Unable to palpate pulses on the right foot. Likely compartment syndrome  NEUROLOGIC: Sedated, myoclonic jerks noted. Opening eyes to verbal commands.  PSYCHIATRIC: The patient is sedated.  SKIN: No obvious rash, lesion, or ulcer.    LABORATORY PANEL:   CBC Recent Labs  Lab 10/31/17 0454  WBC 26.8*  HGB 15.6  HCT 46.8  PLT 151    ------------------------------------------------------------------------------------------------------------------  Chemistries  Recent Labs  Lab 10/30/17 1858 10/30/17 2132  NA 150* 150*  K 4.5 4.1  CL 105 101  CO2 19* 27  GLUCOSE 254* 418*  BUN 24* 29*  CREATININE 2.63* 2.74*  CALCIUM 7.0* 6.0*  MG 2.8*  --   AST  --  1,027*  ALT  --  474*  ALKPHOS  --  44  BILITOT  --  1.8*   ------------------------------------------------------------------------------------------------------------------  Cardiac Enzymes Recent Labs  Lab 10/31/17 0216  TROPONINI 6.44*   ------------------------------------------------------------------------------------------------------------------  RADIOLOGY:  Ct Head Wo Contrast  Result Date: 10/30/2017 CLINICAL DATA:  Patient found unresponsive today. Possible drug overdose. EXAM: CT HEAD WITHOUT CONTRAST TECHNIQUE: Contiguous axial images were obtained from the base of the skull through the vertex without intravenous contrast. COMPARISON:  None. FINDINGS: Brain: No evidence of acute infarction, hemorrhage, hydrocephalus, extra-axial collection or mass lesion/mass effect. Vascular: No hyperdense vessel or unexpected calcification. Skull: Intact. Sinuses/Orbits: Scattered ethmoid air cell disease is noted. Minimal mucosal thickening left sphenoid sinus is seen. Other: None. IMPRESSION: No acute abnormality. Mild left sphenoid sinus and scattered ethmoid air cell disease. Electronically Signed   By: Drusilla Kanner M.D.   On: 10/30/2017 15:20   Dg Chest Port 1 View  Result Date: 10/31/2017 CLINICAL DATA:  Intubation, overdose EXAM: PORTABLE CHEST 1 VIEW COMPARISON:  10/30/2017 FINDINGS: Support devices are stable. Areas of bilateral airspace opacities, improved in the upper lobes since prior study. Continued left perihilar and lower lobe airspace opacity could reflect infection or aspiration. No pneumothorax. No visible significant effusions. No acute  bony abnormality. IMPRESSION: Improving bilateral upper lobe airspace opacities. Continued mild left perihilar and lower lung  airspace opacity which could reflect pneumonia or aspiration. Electronically Signed   By: Charlett Nose M.D.   On: 10/31/2017 07:41   Dg Chest Port 1 View  Result Date: 10/30/2017 CLINICAL DATA:  S/p central line placement and OG and ETT. Hx of asthma and bronchitis EXAM: PORTABLE CHEST 1 VIEW COMPARISON:  10/30/2017 at 12:25 hours FINDINGS: Endotracheal tube has been retracted, tip now well-positioned, 15 mm above the carina. Orogastric tube passes below the diaphragm into the stomach. Left internal jugular central venous line has its tip in the lower superior vena cava. Hazy airspace opacity is noted in both upper lungs and in the medial lower lungs, left greater than right, mildly increased when compared to the prior study. No pneumothorax. IMPRESSION: 1. Endotracheal tube, nasal/orogastric tube and left internal jugular central venous line are all well positioned as detailed above. 2. No pneumothorax. 3. Mild increase in hazy airspace opacities. This may reflect asymmetric edema or multi lobar pneumonia. Electronically Signed   By: Amie Portland M.D.   On: 10/30/2017 18:58   Dg Chest Portable 1 View  Result Date: 10/30/2017 CLINICAL DATA:  Endotracheal tube placement. EXAM: PORTABLE CHEST 1 VIEW COMPARISON:  Radiographs of June 05, 2015. FINDINGS: The heart size and mediastinal contours are within normal limits. Endotracheal tube is seen directed into left mainstem bronchus; withdrawal by 3-4 cm is recommended. Mild bilateral perihilar edema is noted. No pneumothorax is noted. No pleural effusion is noted. The visualized skeletal structures are unremarkable. IMPRESSION: Endotracheal tube directed into left mainstem bronchus; withdrawal by 3-4 cm is recommended. Critical Value/emergent results were called by telephone at the time of interpretation on 10/30/2017 at 12:50 pm to Dr.  Sharman Cheek , who verbally acknowledged these results. Mild bilateral perihilar edema is noted. Electronically Signed   By: Lupita Raider, M.D.   On: 10/30/2017 12:51   Dg Abd Portable 1v  Result Date: 10/30/2017 CLINICAL DATA:  Orogastric tube placement. EXAM: PORTABLE ABDOMEN - 1 VIEW COMPARISON:  None. FINDINGS: Orogastric tube extends below the diaphragm into the mid stomach, well positioned. Stomach is moderately distended with air. No bowel dilation to suggest obstruction. IMPRESSION: Well-positioned orogastric tube within the mid stomach. Electronically Signed   By: Amie Portland M.D.   On: 10/30/2017 18:59    EKG:   Orders placed or performed during the hospital encounter of 10/30/17  . EKG 12-Lead  . EKG 12-Lead  . EKG 12-Lead  . EKG 12-Lead  . EKG 12-Lead  . EKG 12-Lead  . EKG 12-Lead  . EKG 12-Lead    ASSESSMENT AND PLAN:   27 year old male with known history of asthma, tobacco abuse presents to hospital secondary to an unresponsive state.  1. Acute cardio-respiratory failure-concern for drug overdose. -Intubated, on ventilator. -On hypothermia protocol. -Appreciate pulmonary intensive care consult - Myoclonic jerks noted while on sedation, EEG to rule out status epilepticus.  2. Acute renal failure-secondary to rhabdomyolysis. -Appreciate nephrology consult. Potassium is corrected. Continue to monitor urine output on fluids -CRRT if no improvement noted.  3. Rhabdomyolysis-possible right leg compartment syndrome. -Recommend vascular consult. If leg is not salvageable, might need amputation. -CPK significantly elevated -Also transaminases elevated due to rhabdomyolysis. Monitor troponin as well.  4. Sepsis-significantly elevated pro calcitonin, WBC and lactic acid -Continue broad-spectrum antibiotics with vancomycin and Unasyn at this time  5. Polysubstance abuse-will be addressed after extubation. Will need psych consult as well.  6. DVT prophylaxis -  Ted's and SCDs recommended at least  All the records are reviewed and case discussed with Care Management/Social Workerr. Management plans discussed with the patient, family and they are in agreement.  CODE STATUS: Full code  TOTAL TIME TAKING CARE OF THIS PATIENT: 38 minutes.   POSSIBLE D/C IN 4-5 DAYS, DEPENDING ON CLINICAL CONDITION.   Enid BaasKALISETTI,Sharlynn Seckinger M.D on 10/31/2017 at 7:58 AM  Between 7am to 6pm - Pager - [phone]  After 6pm go to [email or web address] - password [email or web address]  [facility]  Office  [phone]  CC: Primary care physician; Patient, No Pcp Per

## 2017-10-31 NOTE — Progress Notes (Signed)
ELECTROLYTE CONSULT  Pharmacy Consult for electrolyte management  Indication: hypokalemia  No Known Allergies  Patient Measurements: Height: 5\' 10"  (177.8 cm) Weight: 175 lb 14.8 oz (79.8 kg) IBW/kg (Calculated) : 73   Labs: Recent Labs    10/30/17 1204 10/30/17 1858 10/30/17 2132 10/31/17 0454 10/31/17 0811 10/31/17 1031 10/31/17 1419  WBC 49.5* 39.5*  --  26.8*  --   --   --   HGB 17.1 16.3  --  15.6  --   --   --   HCT 55.7* 50.9  --  46.8  --   --   --   PLT 282 214  --  151  --   --   --   APTT  --   --  45*  --   --   --   --   CREATININE 2.96* 2.63* 2.74*  --  3.45* 3.48* 3.05*  MG  --  2.8*  --   --   --  1.8 1.6*  PHOS  --  16.0*  --   --   --  4.2 3.7  ALBUMIN 4.9  --  2.2*  --  2.6* 2.5* 2.4*  PROT 8.1  --  3.8*  --  4.9*  --   --   AST 316*  --  1,027*  --  1,662*  --   --   ALT 233*  --  474*  --  659*  --   --   ALKPHOS 84  --  44  --  39  --   --   BILITOT 1.4*  --  1.8*  --  1.4*  --   --    Estimated Creatinine Clearance: 37.2 mL/min (A) (by C-G formula based on SCr of 3.05 mg/dL (H)).  Sodium (mmol/L)  Date Value  10/31/2017 146 (H)  05/08/2014 141   Potassium (mmol/L)  Date Value  10/31/2017 3.9  05/08/2014 4.0   Magnesium (mg/dL)  Date Value  96/04/540903/04/2018 1.6 (L)   Calcium (mg/dL)  Date Value  81/19/147803/04/2018 6.5 (L)   Calcium, Total (mg/dL)  Date Value  29/56/213009/14/2015 9.6   Albumin (g/dL)  Date Value  86/57/846903/04/2018 2.4 (L)  05/08/2014 4.1    Assessment: 27 yo male admitted with cardiac arrest and found to have ventricular fibrillation. Patient has known history of polysubstance abuse and his drug screen was positive for cocaine and opioids. Patient is critically ill and has acute renal failure. Patient is on hypothermia protocol and will be started on CRRT after dialysis catheter placement. Pharmacy has been consulted to monitor and replace electrolytes. Electrolyte replacement should be done cautiously as patient is critically ill and on  hypothermia protocol.   Goal of Therapy:  K > 4 Mg > 2   Plan:  Patient is currently on CRRT with Mg= 1.6 and K= 3.9. Will replace with Mg sulfate 2 g iv once and KCl 10 meq IV x 2. Will continue to follow serial chemistries.   Luisa HartScott Shaquisha Wynn, PharmD Clinical Pharmacist  10/31/2017,2:58 PM

## 2017-10-31 NOTE — Progress Notes (Signed)
ELECTROLYTE CONSULT  Pharmacy Consult for electrolyte management  Indication: hypokalemia  No Known Allergies  Patient Measurements: Height: 5\' 10"  (177.8 cm) Weight: 175 lb 14.8 oz (79.8 kg) IBW/kg (Calculated) : 73   Labs: Recent Labs    10/30/17 1204  10/30/17 1858 10/30/17 2132 10/31/17 0454 10/31/17 0811 10/31/17 1031 10/31/17 1419 10/31/17 1820 10/31/17 2029  WBC 49.5*  --  39.5*  --  26.8*  --   --   --   --   --   HGB 17.1  --  16.3  --  15.6  --   --   --   --   --   HCT 55.7*  --  50.9  --  46.8  --   --   --   --   --   PLT 282  --  214  --  151  --   --   --   --   --   APTT  --   --   --  45*  --   --   --   --   --   --   CREATININE 2.96*  --  2.63* 2.74*  --  3.45* 3.48* 3.05* 2.90*  --   MG  --    < > 2.8*  --   --   --  1.8 1.6* 5.1* 2.0  PHOS  --    < > 16.0*  --   --   --  4.2 3.7 4.8*  --   ALBUMIN 4.9  --   --  2.2*  --  2.6* 2.5* 2.4* 2.5*  --   PROT 8.1  --   --  3.8*  --  4.9*  --   --   --   --   AST 316*  --   --  1,027*  --  1,662*  --   --   --   --   ALT 233*  --   --  474*  --  659*  --   --   --   --   ALKPHOS 84  --   --  44  --  39  --   --   --   --   BILITOT 1.4*  --   --  1.8*  --  1.4*  --   --   --   --    < > = values in this interval not displayed.   Estimated Creatinine Clearance: 39.2 mL/min (A) (by C-G formula based on SCr of 2.9 mg/dL (H)).  Sodium (mmol/L)  Date Value  10/31/2017 143  05/08/2014 141   Potassium (mmol/L)  Date Value  10/31/2017 5.5 (H)  05/08/2014 4.0   Magnesium (mg/dL)  Date Value  62/13/0865 2.0   Calcium (mg/dL)  Date Value  78/46/9629 6.6 (L)   Calcium, Total (mg/dL)  Date Value  52/84/1324 9.6   Albumin (g/dL)  Date Value  40/05/2724 2.5 (L)  05/08/2014 4.1    Assessment: 27 yo male admitted with cardiac arrest and found to have ventricular fibrillation. Patient has known history of polysubstance abuse and his drug screen was positive for cocaine and opioids. Patient is  critically ill and has acute renal failure. Patient is on hypothermia protocol and will be started on CRRT after dialysis catheter placement. Pharmacy has been consulted to monitor and replace electrolytes. Electrolyte replacement should be done cautiously as patient is critically ill and on hypothermia protocol.   Goal of Therapy:  K > 4 Mg > 2   Plan:  K and Mg are elevated. Suspect that infusions were running at time of sample collection. Will f/u next labs.   Luisa HartScott Ennis Delpozo, PharmD Clinical Pharmacist  10/31/2017,9:10 PM

## 2017-10-31 NOTE — Progress Notes (Signed)
Dr. Thedore MinsSingh made aware of patient potassium of 5.8, new order for 2k bath for CRRT.

## 2017-10-31 NOTE — Progress Notes (Signed)
Progress Note  Patient Name: Joshua [name] Date of Encounter: 10/31/2017  Primary Cardiologist: new  Primary Electrophysiologist: new   Patient Profile     27 y.o. male admitted 3/8 cardiac arrest with prolonged downtime.  Found ultimately to have a ventricular fibrillation and defibrillated.  Shock  Known history of polysubstance abuse.  Drug screen positive for cocaine and opiates. Renal failure with potassium 6.3 and creatinine 2.96.  CK now over 50,000.  WBC 49K >>26  Anuric being strated on RRT Currently maintained on Norepi  16 L fluid on admission for shock   Subjective   *intubated and unresponsive   Inpatient Medications    Scheduled Meds: . chlorhexidine gluconate (MEDLINE KIT)  15 mL Mouth Rinse BID  . insulin regular  15 Units Subcutaneous Once  . insulin regular  0-10 Units Intravenous TID WC  . mouth rinse  15 mL Mouth Rinse 10 times per day  . pantoprazole (PROTONIX) IV  40 mg Intravenous QHS  . sodium chloride flush  10-40 mL Intracatheter Q12H   Continuous Infusions: . sodium chloride Stopped (10/30/17 2135)  . ampicillin-sulbactam (UNASYN) IV Stopped (10/31/17 0730)  . dextrose 5 % and 0.45% NaCl    . epinephrine Stopped (10/31/17 0901)  . famotidine (PEPCID) IV Stopped (10/30/17 2333)  . fentaNYL infusion INTRAVENOUS 175 mcg/hr (10/31/17 0942)  . insulin (NOVOLIN-R) infusion 17.6 Units/hr (10/31/17 1044)  . norepinephrine (LEVOPHED) Adult infusion 30 mcg/min (10/31/17 1047)  . potassium chloride 10 mEq (10/31/17 1047)  . pureflow    . vancomycin    . vasopressin (PITRESSIN) infusion - *FOR SHOCK* 0.04 Units/min (10/31/17 0600)   PRN Meds: bisacodyl, dextrose, docusate sodium, fentaNYL, heparin, midazolam, midazolam, sennosides, sodium chloride flush   Vital Signs    Vitals:   10/31/17 0645 10/31/17 0700 10/31/17 0800 10/31/17 0900  BP: 116/69 (!) 160/99 (!) 165/71 (!) 159/96  Pulse: 81 (!) 106 94 95  Resp: (!) 30 (!) 30 (!) 30 (!) 30  Temp: (!)  97.2 F (36.2 C) (!) 97.2 F (36.2 C)    TempSrc: Bladder Bladder    SpO2: 100% 100% 97% 93%  Weight:      Height:        Intake/Output Summary (Last 24 hours) at 10/31/2017 1121 Last data filed at 10/31/2017 0645 Gross per 24 hour  Intake 21286.12 ml  Output 600 ml  Net 20686.12 ml   Filed Weights   10/30/17 1258 10/30/17 1800  Weight: 165 lb (74.8 kg) 175 lb 14.8 oz (79.8 kg)    Telemetry    sinus- Personally Reviewed  ECG       Physical Exam    GEN: intubated and unresponsive Neck:   Cardiac: Rapid but RR,  Respiratory: good air movement   MS: + edema; No deformity. Neuro:  Nonfocal  Psych: Normal affect  Skin Warm and dry   Labs    Chemistry Recent Labs  Lab 10/30/17 1204 10/30/17 1858 10/30/17 2132 10/31/17 0811  NA 144 150* 150* 147*  K 6.3* 4.5 4.1 2.8*  CL 101 105 101 101  CO2 16* 19* 27 25  GLUCOSE 34* 254* 418* 445*  BUN 19 24* 29* 37*  CREATININE 2.96* 2.63* 2.74* 3.45*  CALCIUM 9.6 7.0* 6.0* 6.4*  PROT 8.1  --  3.8* 4.9*  ALBUMIN 4.9  --  2.2* 2.6*  AST 316*  --  1,027* 1,662*  ALT 233*  --  474* 659*  ALKPHOS 84  --  44 39  BILITOT 1.4*  --  1.8* 1.4*  GFRNONAA 27* 31* 30* 23*  GFRAA 32* 36* 35* 26*  ANIONGAP 27* 26* 22* 21*     Hematology Recent Labs  Lab 10/30/17 1204 10/30/17 1858 10/31/17 0454  WBC 49.5* 39.5* 26.8*  RBC 5.55 5.33 5.01  HGB 17.1 16.3 15.6  HCT 55.7* 50.9 46.8  MCV 100.3* 95.4 93.4  MCH 30.9 30.5 31.1  MCHC 30.8* 32.0 33.4  RDW 13.2 12.2 12.4  PLT 282 214 151    Cardiac Enzymes Recent Labs  Lab 10/30/17 1204 10/30/17 2132 10/31/17 0216 10/31/17 0811  TROPONINI 0.92* 3.31* 6.44* 6.06*   No results for input(s): TROPIPOC in the last 168 hours.   BNPNo results for input(s): BNP, PROBNP in the last 168 hours.   DDimer No results for input(s): DDIMER in the last 168 hours.   Radiology    Ct Head Wo Contrast  Result Date: 10/30/2017 CLINICAL DATA:  Patient found unresponsive today. Possible  drug overdose. EXAM: CT HEAD WITHOUT CONTRAST TECHNIQUE: Contiguous axial images were obtained from the base of the skull through the vertex without intravenous contrast. COMPARISON:  None. FINDINGS: Brain: No evidence of acute infarction, hemorrhage, hydrocephalus, extra-axial collection or mass lesion/mass effect. Vascular: No hyperdense vessel or unexpected calcification. Skull: Intact. Sinuses/Orbits: Scattered ethmoid air cell disease is noted. Minimal mucosal thickening left sphenoid sinus is seen. Other: None. IMPRESSION: No acute abnormality. Mild left sphenoid sinus and scattered ethmoid air cell disease. Electronically Signed   By: Inge Rise M.D.   On: 10/30/2017 15:20   Dg Chest Port 1 View  Result Date: 10/31/2017 CLINICAL DATA:  Dialysis catheter placement. Overdose. Asthma bronchitis. EXAM: PORTABLE CHEST 1 VIEW COMPARISON:  Earlier today at 0718 hours. FINDINGS: 1044 hours. Right internal jugular dialysis catheter tip at low SVC. Left internal jugular line unchanged. Endotracheal tube 5.2 cm above carina. Nasogastric tube extends beyond the inferior aspect of the film. Normal heart size for level of inspiration. No pleural effusion or pneumothorax. Lower lobe predominant interstitial and airspace disease is primarily similar. Felt to be slightly increased in the right infrahilar region. IMPRESSION: Right internal jugular dialysis catheter tip at low SVC; no pneumothorax. Slight worsening right infrahilar aeration. Otherwise similar pulmonary edema and/or infection. Electronically Signed   By: Abigail Miyamoto M.D.   On: 10/31/2017 11:14   Dg Chest Port 1 View  Result Date: 10/31/2017 CLINICAL DATA:  Intubation, overdose EXAM: PORTABLE CHEST 1 VIEW COMPARISON:  10/30/2017 FINDINGS: Support devices are stable. Areas of bilateral airspace opacities, improved in the upper lobes since prior study. Continued left perihilar and lower lobe airspace opacity could reflect infection or aspiration. No  pneumothorax. No visible significant effusions. No acute bony abnormality. IMPRESSION: Improving bilateral upper lobe airspace opacities. Continued mild left perihilar and lower lung airspace opacity which could reflect pneumonia or aspiration. Electronically Signed   By: Rolm Baptise M.D.   On: 10/31/2017 07:41   Dg Chest Port 1 View  Result Date: 10/30/2017 CLINICAL DATA:  S/p central line placement and OG and ETT. Hx of asthma and bronchitis EXAM: PORTABLE CHEST 1 VIEW COMPARISON:  10/30/2017 at 12:25 hours FINDINGS: Endotracheal tube has been retracted, tip now well-positioned, 15 mm above the carina. Orogastric tube passes below the diaphragm into the stomach. Left internal jugular central venous line has its tip in the lower superior vena cava. Hazy airspace opacity is noted in both upper lungs and in the medial lower lungs, left greater than right, mildly increased  when compared to the prior study. No pneumothorax. IMPRESSION: 1. Endotracheal tube, nasal/orogastric tube and left internal jugular central venous line are all well positioned as detailed above. 2. No pneumothorax. 3. Mild increase in hazy airspace opacities. This may reflect asymmetric edema or multi lobar pneumonia. Electronically Signed   By: Lajean Manes M.D.   On: 10/30/2017 18:58   Dg Chest Portable 1 View  Result Date: 10/30/2017 CLINICAL DATA:  Endotracheal tube placement. EXAM: PORTABLE CHEST 1 VIEW COMPARISON:  Radiographs of June 05, 2015. FINDINGS: The heart size and mediastinal contours are within normal limits. Endotracheal tube is seen directed into left mainstem bronchus; withdrawal by 3-4 cm is recommended. Mild bilateral perihilar edema is noted. No pneumothorax is noted. No pleural effusion is noted. The visualized skeletal structures are unremarkable. IMPRESSION: Endotracheal tube directed into left mainstem bronchus; withdrawal by 3-4 cm is recommended. Critical Value/emergent results were called by telephone at the  time of interpretation on 10/30/2017 at 12:50 pm to Dr. Carrie Mew , who verbally acknowledged these results. Mild bilateral perihilar edema is noted. Electronically Signed   By: Marijo Conception, M.D.   On: 10/30/2017 12:51   Dg Abd Portable 1v  Result Date: 10/30/2017 CLINICAL DATA:  Orogastric tube placement. EXAM: PORTABLE ABDOMEN - 1 VIEW COMPARISON:  None. FINDINGS: Orogastric tube extends below the diaphragm into the mid stomach, well positioned. Stomach is moderately distended with air. No bowel dilation to suggest obstruction. IMPRESSION: Well-positioned orogastric tube within the mid stomach. Electronically Signed   By: Lajean Manes M.D.   On: 10/30/2017 18:59    Cardiac Studies  Echo P    Assessment & Plan    Shock  VF arrest  Troponin elevation  Cooled  Acute renal failure with Acidosis  Polysubstance abuse  Currently cardiovascular support done by CCM Await echo Tn is likely secondary in the context of his arrest and prolonged down time.  The initial inciting event remains unclear-- ie why did he go down   Will have to wait and see how he does Not optimistic but.     Signed, Virl Axe, MD  10/31/2017, 11:21 AM

## 2017-10-31 NOTE — Progress Notes (Signed)
Pt FSBS 74mg /dl.  Insulin gtt remains off at this time per MD Conforti.  Vital signs stable.

## 2017-10-31 NOTE — Progress Notes (Signed)
PHARMACY CONSULT CRRT MONITORING FOR DOSE ADJUSTMENT   Patient electrolytes are being followed by pharmacy. Will replace as needed.   Unasyn dose is appropriate for CRRT.   Vancomycin dose has been changed from 1250mg  Q24H to 1000mg  Q24H. Pharmacy will continue to follow and adjust as needed.   No other changes needed at this time.   Yolanda BonineHannah Takita Riecke, PharmD Pharmacy Resident

## 2017-10-31 NOTE — Progress Notes (Signed)
Pharmacy Antibiotic Note  Joshua [name] is a 27 y.o. male admitted on 10/30/2017 with Vfib arrest and prolonged CPR. Patient with hyperkalemia, rhabdomyolysis, leukocytosis, and possible compartment syndrome. Pharmacy has been consulted for Unasyn and Vancomycin dosing. Patient with potential to transition to CRRT during current hospitalization.   Plan: Unasyn 3g IV Q6hr.   Patient started on CRRT so changed Vancomycin from 1250mg  Q24H to 1000mg  Q24H. Will obtain random vancomycin level with AM labs on 3/11.  Height: 5\' 10"  (177.8 cm) Weight: 175 lb 14.8 oz (79.8 kg) IBW/kg (Calculated) : 73  Temp (24hrs), Avg:95.7 F (35.4 C), Min:88.2 F (31.2 C), Max:97.5 F (36.4 C)  Recent Labs  Lab 10/30/17 1204 10/30/17 1858 10/30/17 2132 10/30/17 2356 10/31/17 0454 10/31/17 0811 10/31/17 1031 10/31/17 1419  WBC 49.5* 39.5*  --   --  26.8*  --   --   --   CREATININE 2.96* 2.63* 2.74*  --   --  3.45* 3.48* 3.05*  LATICACIDVEN  --   --  12.3* 12.3*  --  10.5*  --   --     Estimated Creatinine Clearance: 37.2 mL/min (A) (by C-G formula based on SCr of 3.05 mg/dL (H)).    No Known Allergies  Antimicrobials this admission: Zosyn 3/8 x 1 Unasyn 3/8 >>  Vancomycin 3/8 >>   Dose adjustments this admission: N/A  Microbiology results: 3/8 MRSA PCR: pending   Thank you for allowing pharmacy to be a part of this patient's care.  Atha StarksHannah C Jovann Luse 10/31/2017 4:02 PM

## 2017-10-31 NOTE — Progress Notes (Signed)
Pt care assumed at this time.  Report received.  Pt found laying in bed on vent.  NAD noted.  Pt opens eyes to verbal stimuli.  MD conforti made aware.  Cooling continued for now.  Will continue to monitor level of responsiveness.

## 2017-10-31 NOTE — Consult Note (Signed)
Reason for Consult:cardiac arrest  Referring Physician: Dr. Lonn Georgia  CC: cardiac arrest   HPI: Joshua [name] is an 27 y.o. male admitted 3/8 cardiac arrest with prolonged downtime.  Found ultimately to have a ventricular fibrillation and defibrillated.  Shock  Known history of polysubstance abuse.  Drug screen positive for cocaine and opiates.Pt has elevated WBC on antibiotics, renal failure that will require HD, and possibly amputation of the RLE.  Pt was on hypothermia protocol but started to wake up and follow commands.      Past Medical History:  Diagnosis Date  . Asthma   . Bronchitis   . Hernia cerebri [facility])     Past Surgical History:  Procedure Laterality Date  . ankle/foot surgery with metal plates      No family history on file.  Social History:  reports that he has been smoking cigarettes.  He has been smoking about 0.50 packs per day. he has never used smokeless tobacco. He reports that he does not drink alcohol or use drugs.  No Known Allergies  Medications: I have reviewed the patient's current medications.  ROS: Unable to obtain  Physical Examination: Blood pressure 103/75, pulse 96, temperature (!) 97.2 F (36.2 C), temperature source Bladder, resp. rate (!) 30, height 5\' 10"  (1.778 m), weight 175 lb 14.8 oz (79.8 kg), SpO2 100 %.  Neurological Examination   Mental Status: Unable to test.  Cranial Nerves: II: Discs flat bilaterally III,IV, VI: Unable to test.  V,VII: Unable to test.  VIII: Unable to test.  IX,X: gag reflex present XI: Unable to test.  XII: Unable to test.  Motor: Was able to squeeze b/l upper extremities Not moving b/l LE Tone and bulk:normal tone throughout; no atrophy noted Sensory: Pinprick and light touch intact throughout, bilaterally Deep Tendon Reflexes: Unable to test.  Cerebellar: Unable to test.     Laboratory Studies:   Basic Metabolic Panel: Recent Labs  Lab 10/30/17 1204 10/30/17 1858 10/30/17 2132  10/31/17 0811 10/31/17 1031  NA 144 150* 150* 147* 146*  K 6.3* 4.5 4.1 2.8* 3.1*  CL 101 105 101 101 102  CO2 16* 19* 27 25 27   GLUCOSE 34* 254* 418* 445* 261*  BUN 19 24* 29* 37* 36*  CREATININE 2.96* 2.63* 2.74* 3.45* 3.48*  CALCIUM 9.6 7.0* 6.0* 6.4* 6.3*  MG  --  2.8*  --   --  1.8  PHOS  --  16.0*  --   --  4.2    Liver Function Tests: Recent Labs  Lab 10/30/17 1204 10/30/17 2132 10/31/17 0811 10/31/17 1031  AST 316* 1,027* 1,662*  --   ALT 233* 474* 659*  --   ALKPHOS 84 44 39  --   BILITOT 1.4* 1.8* 1.4*  --   PROT 8.1 3.8* 4.9*  --   ALBUMIN 4.9 2.2* 2.6* 2.5*   No results for input(s): LIPASE, AMYLASE in the last 168 hours. Recent Labs  Lab 10/30/17 2132  AMMONIA 32    CBC: Recent Labs  Lab 10/30/17 1204 10/30/17 1858 10/31/17 0454  WBC 49.5* 39.5* 26.8*  NEUTROABS 39.0* 37.0*  --   HGB 17.1 16.3 15.6  HCT 55.7* 50.9 46.8  MCV 100.3* 95.4 93.4  PLT 282 214 151    Cardiac Enzymes: Recent Labs  Lab 10/30/17 1204 10/30/17 2132 10/31/17 0216 10/31/17 0454 10/31/17 0811  CKTOTAL 7,162* >50,000* >50,000* >50,000*  --   TROPONINI 0.92* 3.31* 6.44*  --  6.06*    BNP: Invalid  input(s): POCBNP  CBG: Recent Labs  Lab 10/31/17 1154 10/31/17 1258 10/31/17 1300 10/31/17 1405 10/31/17 1425  GLUCAP 125* 46* 99 67 95    Microbiology: Results for orders placed or performed during the hospital encounter of 10/30/17  MRSA PCR Screening     Status: None   Collection Time: 10/30/17  7:54 PM  Result Value Ref Range Status   MRSA by PCR NEGATIVE NEGATIVE Final    Comment:        The GeneXpert MRSA Assay (FDA approved for NASAL specimens only), is one component of a comprehensive MRSA colonization surveillance program. It is not intended to diagnose MRSA infection nor to guide or monitor treatment for MRSA infections. Performed at [facility], [address] [place], [place] [id number]     Coagulation Studies: Recent Labs     10/30/17 1204 10/30/17 1858 10/30/17 2132  LABPROT 19.4* 30.2* 32.3*  INR 1.65 2.91 3.17    Urinalysis:  Recent Labs  Lab 10/30/17 1315  COLORURINE AMBER*  LABSPEC 1.023  PHURINE 5.0  GLUCOSEU NEGATIVE  HGBUR SMALL*  BILIRUBINUR NEGATIVE  KETONESUR NEGATIVE  PROTEINUR 30*  NITRITE NEGATIVE  LEUKOCYTESUR NEGATIVE    Lipid Panel:  No results found for: CHOL, TRIG, HDL, CHOLHDL, VLDL, LDLCALC  HgbA1C: No results found for: HGBA1C  Urine Drug Screen:      Component Value Date/Time   LABOPIA POSITIVE (A) 10/30/2017 1315   COCAINSCRNUR POSITIVE (A) 10/30/2017 1315   LABBENZ NONE DETECTED 10/30/2017 1315   AMPHETMU NONE DETECTED 10/30/2017 1315   THCU NONE DETECTED 10/30/2017 1315   LABBARB NONE DETECTED 10/30/2017 1315    Alcohol Level:  Recent Labs  Lab 10/30/17 1204  ETH <10     Imaging: Ct Head Wo Contrast  Result Date: 10/30/2017 CLINICAL DATA:  Patient found unresponsive today. Possible drug overdose. EXAM: CT HEAD WITHOUT CONTRAST TECHNIQUE: Contiguous axial images were obtained from the base of the skull through the vertex without intravenous contrast. COMPARISON:  None. FINDINGS: Brain: No evidence of acute infarction, hemorrhage, hydrocephalus, extra-axial collection or mass lesion/mass effect. Vascular: No hyperdense vessel or unexpected calcification. Skull: Intact. Sinuses/Orbits: Scattered ethmoid air cell disease is noted. Minimal mucosal thickening left sphenoid sinus is seen. Other: None. IMPRESSION: No acute abnormality. Mild left sphenoid sinus and scattered ethmoid air cell disease. Electronically Signed   By: Drusilla Kannerhomas  Dalessio M.D.   On: 10/30/2017 15:20   Dg Chest Port 1 View  Result Date: 10/31/2017 CLINICAL DATA:  Dialysis catheter placement. Overdose. Asthma bronchitis. EXAM: PORTABLE CHEST 1 VIEW COMPARISON:  Earlier today at 0718 hours. FINDINGS: 1044 hours. Right internal jugular dialysis catheter tip at low SVC. Left internal jugular line  unchanged. Endotracheal tube 5.2 cm above carina. Nasogastric tube extends beyond the inferior aspect of the film. Normal heart size for level of inspiration. No pleural effusion or pneumothorax. Lower lobe predominant interstitial and airspace disease is primarily similar. Felt to be slightly increased in the right infrahilar region. IMPRESSION: Right internal jugular dialysis catheter tip at low SVC; no pneumothorax. Slight worsening right infrahilar aeration. Otherwise similar pulmonary edema and/or infection. Electronically Signed   By: Jeronimo GreavesKyle  Talbot M.D.   On: 10/31/2017 11:14   Dg Chest Port 1 View  Result Date: 10/31/2017 CLINICAL DATA:  Intubation, overdose EXAM: PORTABLE CHEST 1 VIEW COMPARISON:  10/30/2017 FINDINGS: Support devices are stable. Areas of bilateral airspace opacities, improved in the upper lobes since prior study. Continued left perihilar and lower lobe airspace opacity could reflect  infection or aspiration. No pneumothorax. No visible significant effusions. No acute bony abnormality. IMPRESSION: Improving bilateral upper lobe airspace opacities. Continued mild left perihilar and lower lung airspace opacity which could reflect pneumonia or aspiration. Electronically Signed   By: Charlett Nose M.D.   On: 10/31/2017 07:41   Dg Chest Port 1 View  Result Date: 10/30/2017 CLINICAL DATA:  S/p central line placement and OG and ETT. Hx of asthma and bronchitis EXAM: PORTABLE CHEST 1 VIEW COMPARISON:  10/30/2017 at 12:25 hours FINDINGS: Endotracheal tube has been retracted, tip now well-positioned, 15 mm above the carina. Orogastric tube passes below the diaphragm into the stomach. Left internal jugular central venous line has its tip in the lower superior vena cava. Hazy airspace opacity is noted in both upper lungs and in the medial lower lungs, left greater than right, mildly increased when compared to the prior study. No pneumothorax. IMPRESSION: 1. Endotracheal tube, nasal/orogastric tube and  left internal jugular central venous line are all well positioned as detailed above. 2. No pneumothorax. 3. Mild increase in hazy airspace opacities. This may reflect asymmetric edema or multi lobar pneumonia. Electronically Signed   By: Amie Portland M.D.   On: 10/30/2017 18:58   Dg Chest Portable 1 View  Result Date: 10/30/2017 CLINICAL DATA:  Endotracheal tube placement. EXAM: PORTABLE CHEST 1 VIEW COMPARISON:  Radiographs of June 05, 2015. FINDINGS: The heart size and mediastinal contours are within normal limits. Endotracheal tube is seen directed into left mainstem bronchus; withdrawal by 3-4 cm is recommended. Mild bilateral perihilar edema is noted. No pneumothorax is noted. No pleural effusion is noted. The visualized skeletal structures are unremarkable. IMPRESSION: Endotracheal tube directed into left mainstem bronchus; withdrawal by 3-4 cm is recommended. Critical Value/emergent results were called by telephone at the time of interpretation on 10/30/2017 at 12:50 pm to Dr. Sharman Cheek , who verbally acknowledged these results. Mild bilateral perihilar edema is noted. Electronically Signed   By: Lupita Raider, M.D.   On: 10/30/2017 12:51   Dg Abd Portable 1v  Result Date: 10/30/2017 CLINICAL DATA:  Orogastric tube placement. EXAM: PORTABLE ABDOMEN - 1 VIEW COMPARISON:  None. FINDINGS: Orogastric tube extends below the diaphragm into the mid stomach, well positioned. Stomach is moderately distended with air. No bowel dilation to suggest obstruction. IMPRESSION: Well-positioned orogastric tube within the mid stomach. Electronically Signed   By: Amie Portland M.D.   On: 10/30/2017 18:59     Assessment/Plan:  27 y.o. male admitted 3/8 cardiac arrest with prolonged downtime.  Found ultimately to have a ventricular fibrillation and defibrillated.  Shock  Known history of polysubstance abuse.  Drug screen positive for cocaine and opiates.Pt has elevated WBC on antibiotics, renal failure that  will require HD, and possibly amputation of the RLE.  Pt was on hypothermia protocol but started to wake up and follow commands.    Improved prognosis given the fact he was starting to follow commands CTH no acute abnormalities Unable to obtain EEG  If does not continue to improve then would obtain CTH Spoke to family at bedside.   10/31/2017, 2:42 PM

## 2017-10-31 NOTE — Progress Notes (Signed)
Pt with hypoglycemia.  Given D50 per Glucose stabilizer.  Dr. Lonn Georgiaonforti made aware, insulin gtt stopped at this time.  Will continue to monitor blood sugars every hour for now.

## 2017-10-31 NOTE — Progress Notes (Signed)
Pulmonary/critical care  Procedure note Dialysis catheter placement Indication: Renal failure acute Consent is obtained, spoke with wife Performed under ultrasound guidance Right internal jugular vein approach site Complete contact barrier precautions utilized Topical Hibiclens Subdermal lidocaine given Under direct visualization the right internal jugular vein was accessed without difficulty Dark nonpulsatile venous return noted Guidewire was placed and needle was removed Sequential dilation with eventual double lumen dialysis catheter placement Guidewire was removed intact Sutured into place Both ports had easy venous return and were flushed Stat portable chest x-ray pending  Joshua KindredJohn Marshal [name], D.O.
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
## 2017-10-31 NOTE — Progress Notes (Signed)
Notified Maude Leriche, NP of Troponin increasing to 6.44 on latest check. No new orders at this time per [facility].

## 2017-10-31 NOTE — Progress Notes (Signed)
Pt following commands minimal grip strength, however squeezing on command.  Opens eyes to voice.  Appears to be trying to speak to visitors at bedside.  Shaking head in the affirmative when asked some questions.  Appears calm and cooperative at this time.  Family at bedise.  Dr. Gerarda Fractiononfort reports to rewarm patient at this time.  Cooling stopped, and rewarming initiated.  Pts current temp  36.0

## 2017-11-01 ENCOUNTER — Inpatient Hospital Stay: Payer: Self-pay

## 2017-11-01 LAB — COMPREHENSIVE METABOLIC PANEL
ALT: 756 U/L — AB (ref 17–63)
AST: 2233 U/L — AB (ref 15–41)
Albumin: 2.5 g/dL — ABNORMAL LOW (ref 3.5–5.0)
Alkaline Phosphatase: 46 U/L (ref 38–126)
Anion gap: 10 (ref 5–15)
BUN: 28 mg/dL — AB (ref 6–20)
CHLORIDE: 103 mmol/L (ref 101–111)
CO2: 25 mmol/L (ref 22–32)
CREATININE: 2.74 mg/dL — AB (ref 0.61–1.24)
Calcium: 6.9 mg/dL — ABNORMAL LOW (ref 8.9–10.3)
GFR calc Af Amer: 35 mL/min — ABNORMAL LOW (ref >60)
GFR, EST NON AFRICAN AMERICAN: 30 mL/min — AB (ref >60)
Glucose, Bld: 102 mg/dL — ABNORMAL HIGH (ref 65–99)
Potassium: 6.2 mmol/L — ABNORMAL HIGH (ref 3.5–5.1)
Sodium: 138 mmol/L (ref 135–145)
Total Bilirubin: 1.2 mg/dL (ref 0.3–1.2)
Total Protein: 4.8 g/dL — ABNORMAL LOW (ref 6.5–8.1)

## 2017-11-01 LAB — PREPARE FRESH FROZEN PLASMA
UNIT DIVISION: 0
Unit division: 0

## 2017-11-01 LAB — MAGNESIUM
MAGNESIUM: 1.9 mg/dL (ref 1.7–2.4)
MAGNESIUM: 1.9 mg/dL (ref 1.7–2.4)
MAGNESIUM: 1.9 mg/dL (ref 1.7–2.4)
Magnesium: 2 mg/dL (ref 1.7–2.4)
Magnesium: 2 mg/dL (ref 1.7–2.4)
Magnesium: 1.9 mg/dL (ref 1.7–2.4)

## 2017-11-01 LAB — GLUCOSE, CAPILLARY
GLUCOSE-CAPILLARY: 114 mg/dL — AB (ref 65–99)
Glucose-Capillary: 100 mg/dL — ABNORMAL HIGH (ref 65–99)
Glucose-Capillary: 126 mg/dL — ABNORMAL HIGH (ref 65–99)
Glucose-Capillary: 89 mg/dL (ref 65–99)
Glucose-Capillary: 91 mg/dL (ref 65–99)
Glucose-Capillary: 97 mg/dL (ref 65–99)
Glucose-Capillary: 98 mg/dL (ref 65–99)

## 2017-11-01 LAB — RENAL FUNCTION PANEL
ALBUMIN: 2.4 g/dL — AB (ref 3.5–5.0)
ALBUMIN: 2.3 g/dL — AB (ref 3.5–5.0)
ANION GAP: 10 (ref 5–15)
ANION GAP: 13 (ref 5–15)
ANION GAP: 11 (ref 5–15)
Albumin: 2.6 g/dL — ABNORMAL LOW (ref 3.5–5.0)
Albumin: 2.4 g/dL — ABNORMAL LOW (ref 3.5–5.0)
Albumin: 2.4 g/dL — ABNORMAL LOW (ref 3.5–5.0)
Anion gap: 8 (ref 5–15)
Anion gap: 10 (ref 5–15)
BUN: 27 mg/dL — AB (ref 6–20)
BUN: 27 mg/dL — ABNORMAL HIGH (ref 6–20)
BUN: 28 mg/dL — AB (ref 6–20)
BUN: 28 mg/dL — ABNORMAL HIGH (ref 6–20)
BUN: 29 mg/dL — ABNORMAL HIGH (ref 6–20)
CALCIUM: 6.9 mg/dL — AB (ref 8.9–10.3)
CALCIUM: 6.7 mg/dL — AB (ref 8.9–10.3)
CHLORIDE: 101 mmol/L (ref 101–111)
CHLORIDE: 103 mmol/L (ref 101–111)
CO2: 25 mmol/L (ref 22–32)
CO2: 26 mmol/L (ref 22–32)
CO2: 26 mmol/L (ref 22–32)
CO2: 27 mmol/L (ref 22–32)
CO2: 28 mmol/L (ref 22–32)
CREATININE: 2.88 mg/dL — AB (ref 0.61–1.24)
Calcium: 6.8 mg/dL — ABNORMAL LOW (ref 8.9–10.3)
Calcium: 7 mg/dL — ABNORMAL LOW (ref 8.9–10.3)
Calcium: 6.9 mg/dL — ABNORMAL LOW (ref 8.9–10.3)
Chloride: 101 mmol/L (ref 101–111)
Chloride: 99 mmol/L — ABNORMAL LOW (ref 101–111)
Chloride: 102 mmol/L (ref 101–111)
Creatinine, Ser: 2.87 mg/dL — ABNORMAL HIGH (ref 0.61–1.24)
Creatinine, Ser: 2.81 mg/dL — ABNORMAL HIGH (ref 0.61–1.24)
Creatinine, Ser: 2.72 mg/dL — ABNORMAL HIGH (ref 0.61–1.24)
Creatinine, Ser: 2.71 mg/dL — ABNORMAL HIGH (ref 0.61–1.24)
GFR calc Af Amer: 33 mL/min — ABNORMAL LOW (ref >60)
GFR calc Af Amer: 33 mL/min — ABNORMAL LOW (ref >60)
GFR calc Af Amer: 34 mL/min — ABNORMAL LOW (ref >60)
GFR calc Af Amer: 35 mL/min — ABNORMAL LOW (ref >60)
GFR calc non Af Amer: 29 mL/min — ABNORMAL LOW (ref >60)
GFR, EST AFRICAN AMERICAN: 35 mL/min — AB (ref >60)
GFR, EST NON AFRICAN AMERICAN: 28 mL/min — AB (ref >60)
GFR, EST NON AFRICAN AMERICAN: 28 mL/min — AB (ref >60)
GFR, EST NON AFRICAN AMERICAN: 30 mL/min — AB (ref >60)
GFR, EST NON AFRICAN AMERICAN: 30 mL/min — AB (ref >60)
GLUCOSE: 124 mg/dL — AB (ref 65–99)
GLUCOSE: 143 mg/dL — AB (ref 65–99)
Glucose, Bld: 96 mg/dL (ref 65–99)
Glucose, Bld: 112 mg/dL — ABNORMAL HIGH (ref 65–99)
Glucose, Bld: 141 mg/dL — ABNORMAL HIGH (ref 65–99)
PHOSPHORUS: 5.4 mg/dL — AB (ref 2.5–4.6)
PHOSPHORUS: 6.8 mg/dL — AB (ref 2.5–4.6)
POTASSIUM: 5.6 mmol/L — AB (ref 3.5–5.1)
POTASSIUM: 5.3 mmol/L — AB (ref 3.5–5.1)
POTASSIUM: 3.8 mmol/L (ref 3.5–5.1)
Phosphorus: 3.4 mg/dL (ref 2.5–4.6)
Phosphorus: 6.2 mg/dL — ABNORMAL HIGH (ref 2.5–4.6)
Phosphorus: 6.8 mg/dL — ABNORMAL HIGH (ref 2.5–4.6)
Potassium: 6.8 mmol/L (ref 3.5–5.1)
Potassium: 4.7 mmol/L (ref 3.5–5.1)
SODIUM: 138 mmol/L (ref 135–145)
Sodium: 139 mmol/L (ref 135–145)
Sodium: 138 mmol/L (ref 135–145)
Sodium: 137 mmol/L (ref 135–145)
Sodium: 138 mmol/L (ref 135–145)

## 2017-11-01 LAB — LACTIC ACID, PLASMA: LACTIC ACID, VENOUS: 2.9 mmol/L — AB (ref 0.5–1.9)

## 2017-11-01 LAB — CBC
HCT: 40.8 % (ref 40.0–52.0)
Hemoglobin: 13.6 g/dL (ref 13.0–18.0)
MCH: 31.3 pg (ref 26.0–34.0)
MCHC: 33.4 g/dL (ref 32.0–36.0)
MCV: 93.6 fL (ref 80.0–100.0)
PLATELETS: 106 10*3/uL — AB (ref 150–440)
RBC: 4.36 MIL/uL — AB (ref 4.40–5.90)
RDW: 12.5 % (ref 11.5–14.5)
WBC: 31.8 10*3/uL — AB (ref 3.8–10.6)

## 2017-11-01 LAB — PROTIME-INR
INR: 1.66
INR: 1.72
PROTHROMBIN TIME: 20 s — AB (ref 11.4–15.2)
Prothrombin Time: 19.5 seconds — ABNORMAL HIGH (ref 11.4–15.2)

## 2017-11-01 LAB — BLOOD GAS, ARTERIAL
Acid-Base Excess: 2.9 mmol/L — ABNORMAL HIGH (ref 0.0–2.0)
Bicarbonate: 26.1 mmol/L (ref 20.0–28.0)
FIO2: 0.45
MECHVT: 500 mL
O2 SAT: 90.5 %
PATIENT TEMPERATURE: 37
PEEP/CPAP: 5 cmH2O
RATE: 30 resp/min
pCO2 arterial: 35 mmHg (ref 32.0–48.0)
pH, Arterial: 7.48 — ABNORMAL HIGH (ref 7.350–7.450)
pO2, Arterial: 55 mmHg — ABNORMAL LOW (ref 83.0–108.0)

## 2017-11-01 LAB — CK: Total CK: >50000 U/L — ABNORMAL HIGH (ref 49–397)

## 2017-11-01 LAB — BPAM FFP
Blood Product Expiration Date: 201903142359
Blood Product Expiration Date: 201903142359
ISSUE DATE / TIME: 201903090354
ISSUE DATE / TIME: 201903090541
UNIT TYPE AND RH: 6200
Unit Type and Rh: 6200

## 2017-11-01 LAB — PHOSPHORUS: Phosphorus: 4.5 mg/dL (ref 2.5–4.6)

## 2017-11-01 LAB — PROCALCITONIN: Procalcitonin: 33.57 ng/mL

## 2017-11-01 LAB — TROPONIN I: Troponin I: 7.17 ng/mL (ref <0.03)

## 2017-11-01 MED ORDER — SODIUM POLYSTYRENE SULFONATE PO POWD
30.0000 g | Freq: Once | ORAL | Status: AC
  Administered 2017-11-01: 30 g via ORAL
  Filled 2017-11-01: qty 30

## 2017-11-01 MED ORDER — PUREFLOW DIALYSIS SOLUTION
INTRAVENOUS | Status: DC
  Administered 2017-11-01: 02:00:00 via INTRAVENOUS_CENTRAL

## 2017-11-01 MED ORDER — PRO-STAT SUGAR FREE PO LIQD
60.0000 mL | Freq: Three times a day (TID) | ORAL | Status: DC
  Administered 2017-11-01 – 2017-11-03 (×6): 60 mL

## 2017-11-01 MED ORDER — ADULT MULTIVITAMIN LIQUID CH
15.0000 mL | Freq: Every day | ORAL | Status: DC
  Administered 2017-11-02: 15 mL
  Filled 2017-11-01 (×4): qty 15

## 2017-11-01 MED ORDER — PUREFLOW DIALYSIS SOLUTION
INTRAVENOUS | Status: DC
  Administered 2017-11-01: via INTRAVENOUS_CENTRAL

## 2017-11-01 MED ORDER — DEXTROSE 10 % IV SOLN
INTRAVENOUS | Status: DC
  Administered 2017-11-01 – 2017-11-02 (×2): via INTRAVENOUS

## 2017-11-01 MED ORDER — SODIUM CHLORIDE 0.9 % IV SOLN
1.0000 g | Freq: Once | INTRAVENOUS | Status: AC
  Administered 2017-11-01: 1 g via INTRAVENOUS
  Filled 2017-11-01: qty 10

## 2017-11-01 MED ORDER — NEPRO/CARBSTEADY PO LIQD
1000.0000 mL | ORAL | Status: DC
  Administered 2017-11-01: 1000 mL

## 2017-11-01 MED ORDER — VITAL HIGH PROTEIN PO LIQD: 1000.0000 mL | ORAL | Status: DC

## 2017-11-01 MED ORDER — ENOXAPARIN SODIUM 100 MG/ML ~~LOC~~ SOLN
1.0000 mg/kg | Freq: Two times a day (BID) | SUBCUTANEOUS | Status: DC
  Administered 2017-11-01 – 2017-11-02 (×2): 85 mg via SUBCUTANEOUS
  Filled 2017-11-01 (×3): qty 1

## 2017-11-01 NOTE — Progress Notes (Signed)
Chaplain followed up with family. Aunt and friend were at the bedside. Chaplain suggested self care to the family. Family agreed and said they would. Family and Chaplain discussed prognosis of Pt and the impact on his life  The agreed to act as witness to AD.   11/01/17 1000  Clinical Encounter Type  Visited With Family  Visit Type Follow-up;Spiritual support  Referral From Chaplain  Spiritual Encounters  Spiritual Needs Emotional

## 2017-11-01 NOTE — Progress Notes (Signed)
ELECTROLYTE CONSULT  Pharmacy Consult for electrolyte management  Indication: hypokalemia  No Known Allergies  Patient Measurements: Height: 5\' 10"  (177.8 cm) Weight: 187 lb 13.3 oz (85.2 kg) IBW/kg (Calculated) : 73   Labs: Recent Labs    10/30/17 1858 10/30/17 2132 10/31/17 0454 10/31/17 0811  10/31/17 2029 11/01/17 0023 11/01/17 0549  WBC 39.5*  --  26.8*  --   --   --   --  31.8*  HGB 16.3  --  15.6  --   --   --   --  13.6  HCT 50.9  --  46.8  --   --   --   --  40.8  PLT 214  --  151  --   --   --   --  106*  APTT  --  45*  --   --   --   --   --   --   CREATININE 2.63* 2.74*  --  3.45*   < > 2.95* 2.88* 2.74*  MG 2.8*  --   --   --    < > 2.0 1.9 1.9  PHOS 16.0*  --   --   --    < > 4.3 3.4 4.5  ALBUMIN  --  2.2*  --  2.6*   < > 2.5* 2.6* 2.5*  PROT  --  3.8*  --  4.9*  --   --   --  4.8*  AST  --  1,027*  --  1,662*  --   --   --  2,233*  ALT  --  474*  --  659*  --   --   --  756*  ALKPHOS  --  44  --  39  --   --   --  46  BILITOT  --  1.8*  --  1.4*  --   --   --  1.2   < > = values in this interval not displayed.   Estimated Creatinine Clearance: 41.4 mL/min (A) (by C-G formula based on SCr of 2.74 mg/dL (H)).  Sodium (mmol/L)  Date Value  11/01/2017 138  05/08/2014 141   Potassium (mmol/L)  Date Value  11/01/2017 6.2 (H)  05/08/2014 4.0   Magnesium (mg/dL)  Date Value  16/10/960403/05/2018 1.9   Calcium (mg/dL)  Date Value  54/09/811903/05/2018 6.9 (L)   Calcium, Total (mg/dL)  Date Value  14/78/295609/14/2015 9.6   Albumin (g/dL)  Date Value  21/30/865703/05/2018 2.5 (L)  05/08/2014 4.1    Assessment: 27 yo male admitted with cardiac arrest and found to have ventricular fibrillation. Patient has known history of polysubstance abuse and his drug screen was positive for cocaine and opioids. Patient is critically ill and has acute renal failure. Patient is on hypothermia protocol and will be started on CRRT after dialysis catheter placement. Pharmacy has been consulted to  monitor and replace electrolytes. Electrolyte replacement should be done cautiously as patient is critically ill and on hypothermia protocol.   Goal of Therapy:  K > 4 Mg > 2   Plan:  K 6.2 and Mg1.9. Patient on CRRT. Insulin drip stopped  MD ordered Kayexelate Will f/u next labs.   Bari MantisKristin Ansley Stanwood PharmD Clinical Pharmacist 11/01/2017

## 2017-11-01 NOTE — Progress Notes (Signed)
Initial Nutrition Assessment  DOCUMENTATION CODES:   Not applicable  INTERVENTION:  Initiate Nepro at 20 mL/hr (480 mL goal daily volume) + Pro-Stat 60 mL TID via OGT. Provides 1464 kcal, 129 grams of protein, 350 mL H2O. With current propofol rate provides 1908 kcal daily.  Provide liquid MVI daily per tube.  NUTRITION DIAGNOSIS:   Inadequate oral intake related to inability to eat as evidenced by NPO status.  GOAL:   Provide needs based on ASPEN/SCCM guidelines  MONITOR:   Vent status, Labs, Weight trends, TF tolerance, Skin, I & O's  REASON FOR ASSESSMENT:   Ventilator    ASSESSMENT:   27 year old male with PMHx of bronchitis, asthma, polysubstance abuse who is admitted after being found unresponsive in hotel room s/p cardiac arrest with unknown down time and found to be in ventricular fibrillation, also with renal failure, rhabdomyolysis, leukocytosis, shock, and ischemic right lower extremity after being in fetal position. Patient was intubated 3/8 and started on 36C TTM.   -Per vascular note from 3/9 possible BKA as right lower extremity will not likely have any function.  Patient intubated and sedated. On CVVHD with UF of 100 mL/hr and 0K bath in setting of hyperkalemia. Patient is now re-warmed and at 37C. Sister at bedside reports patient ate well PTA. Abdomen soft on NFPE. Per weight history in chart his UBW is likely 165-175 lbs. Current body weight likely falsely elevated with fluid. Will use 79.8 kg from admission to estimate needs.  Access: 18 Fr. OGT placed 3/8; terminates in mid stomach per abdominal x-ray 3/8; 60 cm at corner of mouth  MAP: 67-79 mmHg  Patient is currently intubated on ventilator support MV: 8.7 L/min Temp (24hrs), Avg:98.5 F (36.9 C), Min:98.2 F (36.8 C), Max:98.8 F (37.1 C)  Propofol: 16.8 ml/hr (444 kcal daily)  Medications reviewed and include: pantoprazole, Unasyn, D10 at 20 mL/hr (48 grams dextrose, 163 kcal daily), fentanyl  gtt, norepinephrine gtt at 2 mcg/min, propofol gtt, vancomycin, vasopressin gtt at 0.04 units/min.  Labs reviewed: CBG 89-100, Potassium 6.2, BUN 28, Creatinine 2.74, Phosphorus 4.5, Magnesium 2.  I/O: 60 mL UOP yesterday  Patient does not meet criteria for malnutrition.  Discussed with RN and MD. Molli Knockkay to start tube feeds today.  NUTRITION - FOCUSED PHYSICAL EXAM:    Most Recent Value  Orbital Region  No depletion  Upper Arm Region  No depletion  Thoracic and Lumbar Region  No depletion  Buccal Region  Unable to assess  Temple Region  No depletion  Clavicle Bone Region  No depletion  Clavicle and Acromion Bone Region  No depletion  Scapular Bone Region  Unable to assess  Dorsal Hand  No depletion  Patellar Region  No depletion  Anterior Thigh Region  No depletion  Posterior Calf Region  No depletion  Edema (RD Assessment)  Mild  Hair  Reviewed  Eyes  Unable to assess  Mouth  Unable to assess  Skin  Reviewed  Nails  Reviewed     Diet Order:  Diet NPO time specified  EDUCATION NEEDS:   No education needs have been identified at this time  Skin:  Skin Assessment: Reviewed RN Assessment(pale, mottled)  Last BM:  Unknown  Height:   Ht Readings from Last 1 Encounters:  10/30/17 5\' 10"  (1.778 m)    Weight:   Wt Readings from Last 1 Encounters:  11/01/17 187 lb 13.3 oz (85.2 kg)    Ideal Body Weight:  75.5 kg  BMI:  Body mass index is 26.95 kg/m.  Estimated Nutritional Needs:   Kcal:  1958 (PSU 2003b w/ MSJ 1776, Ve 8.7, Tmax 37.1)  Protein:  120-144 grams (1.5-1.8 grams/kg)  Fluid:  1.5 L/day  Helane Rima, MS, RD, LDN Office: [phone] Pager: [phone] After Hours/Weekend Pager: [phone]

## 2017-11-01 NOTE — Progress Notes (Signed)
NP Luci Bankukov and Dr. Thedore MinsSingh made aware of critical potassium of 6.8. New orders placed. Will continue to monitor.

## 2017-11-01 NOTE — Progress Notes (Signed)
Pt agitated, on vent attempting to pull ET tube and Dialysis cath from neck.  Pt placed in soft wrist restraints.  Bolus of fentanyl given.

## 2017-11-01 NOTE — Progress Notes (Signed)
ELECTROLYTE CONSULT  Pharmacy Consult for electrolyte management  Indication: hypokalemia  No Known Allergies  Patient Measurements: Height: 5\' 10"  (177.8 cm) Weight: 187 lb 13.3 oz (85.2 kg) IBW/kg (Calculated) : 73   Labs: Recent Labs    10/30/17 1858 10/30/17 2132 10/31/17 0454 10/31/17 0811  11/01/17 0549 11/01/17 0937 11/01/17 1409 11/01/17 1803  WBC 39.5*  --  26.8*  --   --  31.8*  --   --   --   HGB 16.3  --  15.6  --   --  13.6  --   --   --   HCT 50.9  --  46.8  --   --  40.8  --   --   --   PLT 214  --  151  --   --  106*  --   --   --   APTT  --  45*  --   --   --   --   --   --   --   CREATININE 2.63* 2.74*  --  3.45*   < > 2.74* 2.87* 2.81* 2.72*  MG 2.8*  --   --   --    < > 1.9 2.0 1.9 2.0  PHOS 16.0*  --   --   --    < > 4.5 5.4* 6.2* 6.8*  ALBUMIN  --  2.2*  --  2.6*   < > 2.5* 2.4* 2.4* 2.4*  PROT  --  3.8*  --  4.9*  --  4.8*  --   --   --   AST  --  1,027*  --  1,662*  --  2,233*  --   --   --   ALT  --  474*  --  659*  --  756*  --   --   --   ALKPHOS  --  44  --  39  --  46  --   --   --   BILITOT  --  1.8*  --  1.4*  --  1.2  --   --   --    < > = values in this interval not displayed.   Estimated Creatinine Clearance: 41.7 mL/min (A) (by C-G formula based on SCr of 2.72 mg/dL (H)).  Sodium (mmol/L)  Date Value  11/01/2017 138  05/08/2014 141   Potassium (mmol/L)  Date Value  11/01/2017 4.7  05/08/2014 4.0   Magnesium (mg/dL)  Date Value  16/10/960403/05/2018 2.0   Calcium (mg/dL)  Date Value  54/09/811903/05/2018 6.9 (L)   Calcium, Total (mg/dL)  Date Value  14/78/295609/14/2015 9.6   Albumin (g/dL)  Date Value  21/30/865703/05/2018 2.4 (L)  05/08/2014 4.1    Assessment: 27 yo male admitted with cardiac arrest and found to have ventricular fibrillation. Patient has known history of polysubstance abuse and his drug screen was positive for cocaine and opioids. Patient is critically ill and has acute renal failure. Patient is on hypothermia protocol and will be  started on CRRT after dialysis catheter placement. Pharmacy has been consulted to monitor and replace electrolytes. Electrolyte replacement should be done cautiously as patient is critically ill and on hypothermia protocol.   Goal of Therapy:  K > 4 Mg > 2   Plan:  WNL. Will f/u next labs.   Luisa HartScott Woodward Klem, PharmD Clinical Pharmacist  11/01/2017

## 2017-11-01 NOTE — Progress Notes (Signed)
Progress Note  Patient Name: Joshua [name] Date of Encounter: 11/01/2017  Primary Cardiologist: new  Primary Electrophysiologist: new   Patient Profile     27 y.o. male admitted 3/8 cardiac arrest with prolonged downtime.  Found ultimately to have a ventricular fibrillation and defibrillated.  Shock  Known history of polysubstance abuse.  Drug screen positive for cocaine and opiates. Renal failure with potassium 6.3 and creatinine 2.96.  CK now over 50,000.  WBC 49K >>26  Anuric being strated on RRT Currently maintained on Norepi  16 L fluid on admission for shock   Subjective   Intubated sedated and unresponsive   Inpatient Medications    Scheduled Meds: . chlorhexidine gluconate (MEDLINE KIT)  15 mL Mouth Rinse BID  . feeding supplement (NEPRO CARB STEADY)  1,000 mL Per Tube Q24H  . feeding supplement (PRO-STAT SUGAR FREE 64)  60 mL Per Tube TID  . insulin regular  15 Units Subcutaneous Once  . mouth rinse  15 mL Mouth Rinse 10 times per day  . multivitamin  15 mL Per Tube Daily  . pantoprazole (PROTONIX) IV  40 mg Intravenous QHS  . sodium chloride flush  10-40 mL Intracatheter Q12H   Continuous Infusions: . sodium chloride Stopped (10/30/17 2135)  . ampicillin-sulbactam (UNASYN) IV 3 g (11/01/17 1255)  . dextrose 20 mL/hr at 11/01/17 0600  . epinephrine Stopped (10/31/17 0901)  . fentaNYL infusion INTRAVENOUS 100 mcg/hr (11/01/17 0604)  . norepinephrine (LEVOPHED) Adult infusion 2 mcg/min (11/01/17 0625)  . propofol (DIPRIVAN) infusion 35 mcg/kg/min (11/01/17 0935)  . pureflow 2,000 mL/hr at 11/01/17 0130  . vasopressin (PITRESSIN) infusion - *FOR SHOCK* 0.04 Units/min (11/01/17 0400)   PRN Meds: bisacodyl, dextrose, docusate sodium, fentaNYL, heparin, midazolam, midazolam, sennosides, sodium chloride flush   Vital Signs    Vitals:   11/01/17 0900 11/01/17 1000 11/01/17 1100 11/01/17 1137  BP: (!) 96/51 (!) 96/51 (!) 106/51   Pulse: 86 83 86   Resp: '18 18 18     ' Temp:      TempSrc:      SpO2:  98% 99% 98%  Weight:      Height:        Intake/Output Summary (Last 24 hours) at 11/01/2017 1331 Last data filed at 11/01/2017 1100 Gross per 24 hour  Intake 3092.88 ml  Output 2566 ml  Net 526.88 ml   Filed Weights   10/30/17 1800 10/31/17 1700 11/01/17 0500  Weight: 175 lb 14.8 oz (79.8 kg) 175 lb 14.8 oz (79.8 kg) 187 lb 13.3 oz (85.2 kg)    Telemetry    Sinus tach- Personally Reviewed  ECG       Physical Exam  Well developed and nourished intubated unresponsive HENT normal Neck supple  Clear Rapid but regular rate and rhythm, 2/6 Abd-soft with active BS No Clubbing cyanosis edema mottled R LE  Skin-warm and dry A & Oriented  Grossly normal sensory and motor function   Labs    Chemistry Recent Labs  Lab 10/30/17 2132 10/31/17 0811  11/01/17 0023 11/01/17 0549 11/01/17 0937  NA 150* 147*   < > 139 138 138  K 4.1 2.8*   < > 6.8* 6.2* 5.6*  CL 101 101   < > 103 103 101  CO2 27 25   < > '28 25 27  ' GLUCOSE 418* 445*   < > 96 102* 112*  BUN 29* 37*   < > 27* 28* 28*  CREATININE 2.74* 3.45*   < >  2.88* 2.74* 2.87*  CALCIUM 6.0* 6.4*   < > 6.8* 6.9* 7.0*  PROT 3.8* 4.9*  --   --  4.8*  --   ALBUMIN 2.2* 2.6*   < > 2.6* 2.5* 2.4*  AST 1,027* 1,662*  --   --  2,233*  --   ALT 474* 659*  --   --  756*  --   ALKPHOS 44 39  --   --  46  --   BILITOT 1.8* 1.4*  --   --  1.2  --   GFRNONAA 30* 23*   < > 28* 30* 28*  GFRAA 35* 26*   < > 33* 35* 33*  ANIONGAP 22* 21*   < > '8 10 10   ' < > = values in this interval not displayed.     Hematology Recent Labs  Lab 10/30/17 1858 10/31/17 0454 11/01/17 0549  WBC 39.5* 26.8* 31.8*  RBC 5.33 5.01 4.36*  HGB 16.3 15.6 13.6  HCT 50.9 46.8 40.8  MCV 95.4 93.4 93.6  MCH 30.5 31.1 31.3  MCHC 32.0 33.4 33.4  RDW 12.2 12.4 12.5  PLT 214 151 106*    Cardiac Enzymes Recent Labs  Lab 10/30/17 2132 10/31/17 0216 10/31/17 0811 11/01/17 0549  TROPONINI 3.31* 6.44* 6.06* 7.17*    No results for input(s): TROPIPOC in the last 168 hours.   BNPNo results for input(s): BNP, PROBNP in the last 168 hours.   DDimer No results for input(s): DDIMER in the last 168 hours.   Radiology    Ct Head Wo Contrast  Result Date: 10/30/2017 CLINICAL DATA:  Patient found unresponsive today. Possible drug overdose. EXAM: CT HEAD WITHOUT CONTRAST TECHNIQUE: Contiguous axial images were obtained from the base of the skull through the vertex without intravenous contrast. COMPARISON:  None. FINDINGS: Brain: No evidence of acute infarction, hemorrhage, hydrocephalus, extra-axial collection or mass lesion/mass effect. Vascular: No hyperdense vessel or unexpected calcification. Skull: Intact. Sinuses/Orbits: Scattered ethmoid air cell disease is noted. Minimal mucosal thickening left sphenoid sinus is seen. Other: None. IMPRESSION: No acute abnormality. Mild left sphenoid sinus and scattered ethmoid air cell disease. Electronically Signed   By: Inge Rise M.D.   On: 10/30/2017 15:20   Dg Chest Port 1 View  Result Date: 11/01/2017 CLINICAL DATA:  Acute respiratory failure EXAM: PORTABLE CHEST 1 VIEW COMPARISON:  10/31/2017 FINDINGS: Support devices are stable. Layering bilateral effusions have increased since prior study with bilateral lower lobe atelectasis or infiltrates. Heart is normal size. IMPRESSION: Increasing bilateral effusions and bibasilar atelectasis or infiltrates. Electronically Signed   By: Rolm Baptise M.D.   On: 11/01/2017 07:26   Dg Chest Port 1 View  Result Date: 10/31/2017 CLINICAL DATA:  Dialysis catheter placement. Overdose. Asthma bronchitis. EXAM: PORTABLE CHEST 1 VIEW COMPARISON:  Earlier today at 0718 hours. FINDINGS: 1044 hours. Right internal jugular dialysis catheter tip at low SVC. Left internal jugular line unchanged. Endotracheal tube 5.2 cm above carina. Nasogastric tube extends beyond the inferior aspect of the film. Normal heart size for level of inspiration. No  pleural effusion or pneumothorax. Lower lobe predominant interstitial and airspace disease is primarily similar. Felt to be slightly increased in the right infrahilar region. IMPRESSION: Right internal jugular dialysis catheter tip at low SVC; no pneumothorax. Slight worsening right infrahilar aeration. Otherwise similar pulmonary edema and/or infection. Electronically Signed   By: Abigail Miyamoto M.D.   On: 10/31/2017 11:14   Dg Chest Port 1 View  Result Date: 10/31/2017 CLINICAL  DATA:  Intubation, overdose EXAM: PORTABLE CHEST 1 VIEW COMPARISON:  10/30/2017 FINDINGS: Support devices are stable. Areas of bilateral airspace opacities, improved in the upper lobes since prior study. Continued left perihilar and lower lobe airspace opacity could reflect infection or aspiration. No pneumothorax. No visible significant effusions. No acute bony abnormality. IMPRESSION: Improving bilateral upper lobe airspace opacities. Continued mild left perihilar and lower lung airspace opacity which could reflect pneumonia or aspiration. Electronically Signed   By: Rolm Baptise M.D.   On: 10/31/2017 07:41   Dg Chest Port 1 View  Result Date: 10/30/2017 CLINICAL DATA:  S/p central line placement and OG and ETT. Hx of asthma and bronchitis EXAM: PORTABLE CHEST 1 VIEW COMPARISON:  10/30/2017 at 12:25 hours FINDINGS: Endotracheal tube has been retracted, tip now well-positioned, 15 mm above the carina. Orogastric tube passes below the diaphragm into the stomach. Left internal jugular central venous line has its tip in the lower superior vena cava. Hazy airspace opacity is noted in both upper lungs and in the medial lower lungs, left greater than right, mildly increased when compared to the prior study. No pneumothorax. IMPRESSION: 1. Endotracheal tube, nasal/orogastric tube and left internal jugular central venous line are all well positioned as detailed above. 2. No pneumothorax. 3. Mild increase in hazy airspace opacities. This may  reflect asymmetric edema or multi lobar pneumonia. Electronically Signed   By: Lajean Manes M.D.   On: 10/30/2017 18:58   Dg Abd Portable 1v  Result Date: 10/30/2017 CLINICAL DATA:  Orogastric tube placement. EXAM: PORTABLE ABDOMEN - 1 VIEW COMPARISON:  None. FINDINGS: Orogastric tube extends below the diaphragm into the mid stomach, well positioned. Stomach is moderately distended with air. No bowel dilation to suggest obstruction. IMPRESSION: Well-positioned orogastric tube within the mid stomach. Electronically Signed   By: Lajean Manes M.D.   On: 10/30/2017 18:59    Cardiac Studies  Echo EF 35%    Assessment & Plan    Shock  VF arrest  Troponin elevation continuing   Cooled  Acute renal failure with Acidosis/hyperkalemia  Polysubstance abuse   Cardiomyopathy  Currently supported by Norepi  On CRRT for acidosis, hyperkalemia and renal failure  Troponin continuing to trend up.  At his age, the pretest likelihood for CAD is low, but drugs can make it different.  Will follow troponin and ask CCM re full dose LMWH   Continue supportive care   Signed, Virl Axe, MD  11/01/2017, 1:31 PM

## 2017-11-01 NOTE — Progress Notes (Signed)
Chaplain followed up visit with mother of the Pt. She talked about her family relationships and the cause of her PTSD. We discussed her pain and possible pain of her son. We talked about the dynamics of what the prognosis of the Pt .   11/01/17 1400  Clinical Encounter Type  Visited With Patient and family together  Visit Type Follow-up;Spiritual support  Referral From Chaplain  Spiritual Encounters  Spiritual Needs Prayer;Emotional  Stress Factors  Family Stress Factors Family relationships

## 2017-11-01 NOTE — Progress Notes (Signed)
[facility] Progess Note    SYNOPSIS   27 year old gentleman ith a past medical history of polysubstance abuse, status post prolonged cardiac arrest, respiratory failure, renal failure, rhabdomyolysis, shock  ASSESSMENT/PLAN   Status post cardiac arrest.   Patient with significant improvement in mental status.  Awake, alert and communicating.  Stopped targeted temperature management.  Polysubstance abuse. Known history of drug abuse. Per friends and family he snorts his drugs and are not taken IV area urine drug screen was positive for cocaine and opiates  Renal failure.  Hemodialysis catheter was placed, continues on CRRT.  Rhabdomyolysis. Rhabdomyolysis with renal failure.  CK is greater than 50,000,will start CRRT  Leukocytosis. Elevated white count on broad-spectrum antibiotic coverage.  Patient is presently on Unasyn and vancomycin   hyperkalemia.  Patient given calcium, Kayexalate, switch to a 0 potassium bath on CRRT  Ischemic lower extremity.  Appreciate vascular surgery input and follow-up.  Leg is somewhat warmer today and demarcating lower down the skin.  No peripheral pulses noted, dorsalis pedis, posterior tibial  Critical care time 35  minutes  VENTILATOR SETTINGS: Vent Mode: PRVC FiO2 (%):  [45 %-80 %] 50 % Set Rate:  [18 bmp-30 bmp] 18 bmp Vt Set:  [500 mL] 500 mL PEEP:  [5 cmH20] 5 cmH20 Plateau Pressure:  [15 cmH20] 15 cmH20  HEMODYNAMICS: CVP:  [8 mmHg-15 mmHg] 9 mmHg  INTAKE / OUTPUT:  Intake/Output Summary (Last 24 hours) at 11/01/2017 1001 Last data filed at 11/01/2017 0600 Gross per 24 hour  Intake 3092.88 ml  Output 2202 ml  Net 890.88 ml    Name: Joshua [name] MRN: [id number] DOB: 10/16/1990    ADMISSION DATE:  10/30/2017  SUBJECTIVE:   Overnight patient has remained on vasopressin, norepinephrine, epinephrine, bicarbonate infusion, mechanical ventilation. Right leg still looks mottled Patient remains on hypothermia  protocol. This morning patient does startle when you call his name but nothing purposefully performed  VITAL SIGNS: Temp:  [98.2 F (36.8 C)-98.8 F (37.1 C)] 98.6 F (37 C) (03/10 0800) Pulse Rate:  [74-96] 87 (03/10 0800) Resp:  [16-30] 18 (03/10 0800) BP: (83-126)/(52-86) 99/55 (03/10 0800) SpO2:  [94 %-100 %] 98 % (03/10 0800) FiO2 (%):  [45 %-80 %] 50 % (03/10 0744) Weight:  [79.8 kg (175 lb 14.8 oz)-85.2 kg (187 lb 13.3 oz)] 85.2 kg (187 lb 13.3 oz) (03/10 0500)  PHYSICAL EXAMINATION: Physical Examination:   VS: BP (!) 99/55   Pulse 87   Temp 98.6 F (37 C) (Core)   Resp 18   Ht 5\' 10"  (1.778 m)   Wt 85.2 kg (187 lb 13.3 oz)   SpO2 98%   BMI 26.95 kg/m   General Appearance: critically ill unresponsive in the intensive care unit Neuro:patient does startle when you call his name HEENT:pupils dilated and sluggish to respond, orally intubated, oral gastric tube, trachea is midline Pulmonary: coarse rhonchi appreciated CardiovascularNormal S1,S2.  No m/r/g.   Abdomen: soft exam, positive bowel sounds Extremities: patient's right lower extremity ecchymosis noted, pulses appreciated popliteal but no dorsalis pedisor posterior tibial.  LABORATORY PANEL:   CBC Recent Labs  Lab 11/01/17 0549  WBC 31.8*  HGB 13.6  HCT 40.8  PLT 106*    Chemistries  Recent Labs  Lab 11/01/17 0549  NA 138  K 6.2*  CL 103  CO2 25  GLUCOSE 102*  BUN 28*  CREATININE 2.74*  CALCIUM 6.9*  MG 1.9  PHOS 4.5  AST 2,233*  ALT 756*  ALKPHOS 46  BILITOT 1.2    Recent Labs  Lab 10/31/17 2318 11/01/17 0025 11/01/17 0129 11/01/17 0429 11/01/17 0548 11/01/17 0623  GLUCAP 86 97 91 89 98 100*   Recent Labs  Lab 10/30/17 2300 10/31/17 1747 11/01/17 0023  PHART 7.22* 7.40 7.48*  PCO2ART 56* 48 35  PO2ART 75* 69* 55*   Recent Labs  Lab 10/30/17 2132 10/31/17 0811  10/31/17 2029 11/01/17 0023 11/01/17 0549  AST 1,027* 1,662*  --   --   --  2,233*  ALT 474* 659*  --    --   --  756*  ALKPHOS 44 39  --   --   --  46  BILITOT 1.8* 1.4*  --   --   --  1.2  ALBUMIN 2.2* 2.6*   < > 2.5* 2.6* 2.5*   < > = values in this interval not displayed.    Cardiac Enzymes Recent Labs  Lab 11/01/17 0549  TROPONINI 7.17*    RADIOLOGY:  Ct Head Wo Contrast  Result Date: 10/30/2017 CLINICAL DATA:  Patient found unresponsive today. Possible drug overdose. EXAM: CT HEAD WITHOUT CONTRAST TECHNIQUE: Contiguous axial images were obtained from the base of the skull through the vertex without intravenous contrast. COMPARISON:  None. FINDINGS: Brain: No evidence of acute infarction, hemorrhage, hydrocephalus, extra-axial collection or mass lesion/mass effect. Vascular: No hyperdense vessel or unexpected calcification. Skull: Intact. Sinuses/Orbits: Scattered ethmoid air cell disease is noted. Minimal mucosal thickening left sphenoid sinus is seen. Other: None. IMPRESSION: No acute abnormality. Mild left sphenoid sinus and scattered ethmoid air cell disease. Electronically Signed   By: Drusilla Kanner M.D.   On: 10/30/2017 15:20   Dg Chest Port 1 View  Result Date: 11/01/2017 CLINICAL DATA:  Acute respiratory failure EXAM: PORTABLE CHEST 1 VIEW COMPARISON:  10/31/2017 FINDINGS: Support devices are stable. Layering bilateral effusions have increased since prior study with bilateral lower lobe atelectasis or infiltrates. Heart is normal size. IMPRESSION: Increasing bilateral effusions and bibasilar atelectasis or infiltrates. Electronically Signed   By: Charlett Nose M.D.   On: 11/01/2017 07:26   Dg Chest Port 1 View  Result Date: 10/31/2017 CLINICAL DATA:  Dialysis catheter placement. Overdose. Asthma bronchitis. EXAM: PORTABLE CHEST 1 VIEW COMPARISON:  Earlier today at 0718 hours. FINDINGS: 1044 hours. Right internal jugular dialysis catheter tip at low SVC. Left internal jugular line unchanged. Endotracheal tube 5.2 cm above carina. Nasogastric tube extends beyond the inferior aspect  of the film. Normal heart size for level of inspiration. No pleural effusion or pneumothorax. Lower lobe predominant interstitial and airspace disease is primarily similar. Felt to be slightly increased in the right infrahilar region. IMPRESSION: Right internal jugular dialysis catheter tip at low SVC; no pneumothorax. Slight worsening right infrahilar aeration. Otherwise similar pulmonary edema and/or infection. Electronically Signed   By: Jeronimo Greaves M.D.   On: 10/31/2017 11:14   Dg Chest Port 1 View  Result Date: 10/31/2017 CLINICAL DATA:  Intubation, overdose EXAM: PORTABLE CHEST 1 VIEW COMPARISON:  10/30/2017 FINDINGS: Support devices are stable. Areas of bilateral airspace opacities, improved in the upper lobes since prior study. Continued left perihilar and lower lobe airspace opacity could reflect infection or aspiration. No pneumothorax. No visible significant effusions. No acute bony abnormality. IMPRESSION: Improving bilateral upper lobe airspace opacities. Continued mild left perihilar and lower lung airspace opacity which could reflect pneumonia or aspiration. Electronically Signed   By: Charlett Nose M.D.   On: 10/31/2017 07:41   Dg Chest  Port 1 View  Result Date: 10/30/2017 CLINICAL DATA:  S/p central line placement and OG and ETT. Hx of asthma and bronchitis EXAM: PORTABLE CHEST 1 VIEW COMPARISON:  10/30/2017 at 12:25 hours FINDINGS: Endotracheal tube has been retracted, tip now well-positioned, 15 mm above the carina. Orogastric tube passes below the diaphragm into the stomach. Left internal jugular central venous line has its tip in the lower superior vena cava. Hazy airspace opacity is noted in both upper lungs and in the medial lower lungs, left greater than right, mildly increased when compared to the prior study. No pneumothorax. IMPRESSION: 1. Endotracheal tube, nasal/orogastric tube and left internal jugular central venous line are all well positioned as detailed above. 2. No  pneumothorax. 3. Mild increase in hazy airspace opacities. This may reflect asymmetric edema or multi lobar pneumonia. Electronically Signed   By: Amie Portland M.D.   On: 10/30/2017 18:58   Dg Chest Portable 1 View  Result Date: 10/30/2017 CLINICAL DATA:  Endotracheal tube placement. EXAM: PORTABLE CHEST 1 VIEW COMPARISON:  Radiographs of June 05, 2015. FINDINGS: The heart size and mediastinal contours are within normal limits. Endotracheal tube is seen directed into left mainstem bronchus; withdrawal by 3-4 cm is recommended. Mild bilateral perihilar edema is noted. No pneumothorax is noted. No pleural effusion is noted. The visualized skeletal structures are unremarkable. IMPRESSION: Endotracheal tube directed into left mainstem bronchus; withdrawal by 3-4 cm is recommended. Critical Value/emergent results were called by telephone at the time of interpretation on 10/30/2017 at 12:50 pm to Dr. Sharman Cheek , who verbally acknowledged these results. Mild bilateral perihilar edema is noted. Electronically Signed   By: Lupita Raider, M.D.   On: 10/30/2017 12:51   Dg Abd Portable 1v  Result Date: 10/30/2017 CLINICAL DATA:  Orogastric tube placement. EXAM: PORTABLE ABDOMEN - 1 VIEW COMPARISON:  None. FINDINGS: Orogastric tube extends below the diaphragm into the mid stomach, well positioned. Stomach is moderately distended with air. No bowel dilation to suggest obstruction. IMPRESSION: Well-positioned orogastric tube within the mid stomach. Electronically Signed   By: Amie Portland M.D.   On: 10/30/2017 18:59    Tora Kindred, DO  11/01/2017
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
## 2017-11-01 NOTE — Progress Notes (Signed)
[employer] [facility] - [place] [facility]   PATIENT NAME: Joshua [name]    MR#:  [id number]  DATE OF BIRTH:  [birth year]  SUBJECTIVE:  CHIEF COMPLAINT:   Chief Complaint  Patient presents with  . Drug Overdose   - Cardiac arrest and hypothermia protocol. Improving mental status. -Started on CRRT -Still needing low-dose Levophed and vasopressin  REVIEW OF SYSTEMS:  Review of Systems  Unable to perform ROS: Critical illness    DRUG ALLERGIES:  No Known Allergies  VITALS:  Blood pressure (!) 99/55, pulse 87, temperature 98.6 F (37 C), temperature source Core, resp. rate 18, height 5\' 10"  (1.778 m), weight 85.2 kg (187 lb 13.3 oz), SpO2 98 %.  PHYSICAL EXAMINATION:  Physical Exam  GENERAL:  27 y.o.-year-old patient lying in the bed, critically ill appearing EYES: Pupils equal, round, dilated, reactive to light and accommodation. No scleral icterus. Extraocular muscles intact. Swollen eyelids noted HEENT: Head atraumatic, normocephalic. Oropharynx and nasopharynx clear.  NECK:  Supple, no jugular venous distention. No thyroid enlargement, no tenderness.  LUNGS: Normal breath sounds bilaterally, no wheezing, rales,rhonchi or crepitation. No use of accessory muscles of respiration.  CARDIOVASCULAR: S1, S2 normal. No murmurs, rubs, or gallops.  ABDOMEN: Soft, nontender, nondistended. Bowel sounds present. No organomegaly or mass.  EXTREMITIES: Improved Right lower extremity erythema especially at the foot extremely cyanotic. Still cold and Unable to palpate pulses on the right foot. Likely compartment syndrome  -2+ bilateral lower extremity edema noted NEUROLOGIC: Sedated. Opening eyes to verbal commands.  PSYCHIATRIC: The patient is sedated.  SKIN: No obvious rash, lesion, or ulcer.    LABORATORY PANEL:   CBC Recent Labs  Lab 11/01/17 0549  WBC 31.8*  HGB 13.6  HCT 40.8  PLT 106*    ------------------------------------------------------------------------------------------------------------------  Chemistries  Recent Labs  Lab 11/01/17 0549  NA 138  K 6.2*  CL 103  CO2 25  GLUCOSE 102*  BUN 28*  CREATININE 2.74*  CALCIUM 6.9*  MG 1.9  AST 2,233*  ALT 756*  ALKPHOS 46  BILITOT 1.2   ------------------------------------------------------------------------------------------------------------------  Cardiac Enzymes Recent Labs  Lab 11/01/17 0549  TROPONINI 7.17*   ------------------------------------------------------------------------------------------------------------------  RADIOLOGY:  Ct Head Wo Contrast  Result Date: 10/30/2017 CLINICAL DATA:  Patient found unresponsive today. Possible drug overdose. EXAM: CT HEAD WITHOUT CONTRAST TECHNIQUE: Contiguous axial images were obtained from the base of the skull through the vertex without intravenous contrast. COMPARISON:  None. FINDINGS: Brain: No evidence of acute infarction, hemorrhage, hydrocephalus, extra-axial collection or mass lesion/mass effect. Vascular: No hyperdense vessel or unexpected calcification. Skull: Intact. Sinuses/Orbits: Scattered ethmoid air cell disease is noted. Minimal mucosal thickening left sphenoid sinus is seen. Other: None. IMPRESSION: No acute abnormality. Mild left sphenoid sinus and scattered ethmoid air cell disease. Electronically Signed   By: Drusilla Kanner M.D.   On: 10/30/2017 15:20   Dg Chest Port 1 View  Result Date: 11/01/2017 CLINICAL DATA:  Acute respiratory failure EXAM: PORTABLE CHEST 1 VIEW COMPARISON:  10/31/2017 FINDINGS: Support devices are stable. Layering bilateral effusions have increased since prior study with bilateral lower lobe atelectasis or infiltrates. Heart is normal size. IMPRESSION: Increasing bilateral effusions and bibasilar atelectasis or infiltrates. Electronically Signed   By: Charlett Nose M.D.   On: 11/01/2017 07:26   Dg Chest Port 1  View  Result Date: 10/31/2017 CLINICAL DATA:  Dialysis catheter placement. Overdose. Asthma bronchitis. EXAM: PORTABLE CHEST 1 VIEW COMPARISON:  Earlier today at 0718 hours. FINDINGS: 1044 hours. Right internal  jugular dialysis catheter tip at low SVC. Left internal jugular line unchanged. Endotracheal tube 5.2 cm above carina. Nasogastric tube extends beyond the inferior aspect of the film. Normal heart size for level of inspiration. No pleural effusion or pneumothorax. Lower lobe predominant interstitial and airspace disease is primarily similar. Felt to be slightly increased in the right infrahilar region. IMPRESSION: Right internal jugular dialysis catheter tip at low SVC; no pneumothorax. Slight worsening right infrahilar aeration. Otherwise similar pulmonary edema and/or infection. Electronically Signed   By: Jeronimo GreavesKyle  Talbot M.D.   On: 10/31/2017 11:14   Dg Chest Port 1 View  Result Date: 10/31/2017 CLINICAL DATA:  Intubation, overdose EXAM: PORTABLE CHEST 1 VIEW COMPARISON:  10/30/2017 FINDINGS: Support devices are stable. Areas of bilateral airspace opacities, improved in the upper lobes since prior study. Continued left perihilar and lower lobe airspace opacity could reflect infection or aspiration. No pneumothorax. No visible significant effusions. No acute bony abnormality. IMPRESSION: Improving bilateral upper lobe airspace opacities. Continued mild left perihilar and lower lung airspace opacity which could reflect pneumonia or aspiration. Electronically Signed   By: Charlett NoseKevin  Dover M.D.   On: 10/31/2017 07:41   Dg Chest Port 1 View  Result Date: 10/30/2017 CLINICAL DATA:  S/p central line placement and OG and ETT. Hx of asthma and bronchitis EXAM: PORTABLE CHEST 1 VIEW COMPARISON:  10/30/2017 at 12:25 hours FINDINGS: Endotracheal tube has been retracted, tip now well-positioned, 15 mm above the carina. Orogastric tube passes below the diaphragm into the stomach. Left internal jugular central venous  line has its tip in the lower superior vena cava. Hazy airspace opacity is noted in both upper lungs and in the medial lower lungs, left greater than right, mildly increased when compared to the prior study. No pneumothorax. IMPRESSION: 1. Endotracheal tube, nasal/orogastric tube and left internal jugular central venous line are all well positioned as detailed above. 2. No pneumothorax. 3. Mild increase in hazy airspace opacities. This may reflect asymmetric edema or multi lobar pneumonia. Electronically Signed   By: Amie Portlandavid  Ormond M.D.   On: 10/30/2017 18:58   Dg Chest Portable 1 View  Result Date: 10/30/2017 CLINICAL DATA:  Endotracheal tube placement. EXAM: PORTABLE CHEST 1 VIEW COMPARISON:  Radiographs of June 05, 2015. FINDINGS: The heart size and mediastinal contours are within normal limits. Endotracheal tube is seen directed into left mainstem bronchus; withdrawal by 3-4 cm is recommended. Mild bilateral perihilar edema is noted. No pneumothorax is noted. No pleural effusion is noted. The visualized skeletal structures are unremarkable. IMPRESSION: Endotracheal tube directed into left mainstem bronchus; withdrawal by 3-4 cm is recommended. Critical Value/emergent results were called by telephone at the time of interpretation on 10/30/2017 at 12:50 pm to Dr. Sharman CheekPHILLIP STAFFORD , who verbally acknowledged these results. Mild bilateral perihilar edema is noted. Electronically Signed   By: Lupita RaiderJames  Green Jr, M.D.   On: 10/30/2017 12:51   Dg Abd Portable 1v  Result Date: 10/30/2017 CLINICAL DATA:  Orogastric tube placement. EXAM: PORTABLE ABDOMEN - 1 VIEW COMPARISON:  None. FINDINGS: Orogastric tube extends below the diaphragm into the mid stomach, well positioned. Stomach is moderately distended with air. No bowel dilation to suggest obstruction. IMPRESSION: Well-positioned orogastric tube within the mid stomach. Electronically Signed   By: Amie Portlandavid  Ormond M.D.   On: 10/30/2017 18:59    EKG:   Orders placed  or performed during the hospital encounter of 10/30/17  . EKG 12-Lead  . EKG 12-Lead  . EKG 12-Lead  . EKG 12-Lead  .  EKG 12-Lead  . EKG 12-Lead  . EKG 12-Lead  . EKG 12-Lead    ASSESSMENT AND PLAN:   27 year old male with known history of asthma, tobacco abuse presents to hospital secondary to an unresponsive state.  1. Acute cardio-respiratory failure-concern for drug overdose. -Intubated, on ventilator. -On hypothermia protocol. Improving mental status -Appreciate pulmonary intensive care consult and neurology consult -If no improvement, recommend repeat CT head  2. Acute renal failure and hyperkalemia-secondary to rhabdomyolysis. -Appreciate nephrology consult.  -Started on CRRT  3. Rhabdomyolysis-possible right leg compartment syndrome. -Appreciate vascular consult. Redness is improving, still cold and unable to palpate pulses. -CPK significantly elevated -Also transaminases elevated due to rhabdomyolysis. Monitor troponin as well. -If the leg does not improve, amputation might be needed, however improving at this time  4. Sepsis-significantly elevated pro calcitonin, WBC and lactic acid -Continue broad-spectrum antibiotics with vancomycin and Unasyn at this time  5. Polysubstance abuse-will be addressed after extubation. Will need psych consult as well.  6. DVT prophylaxis - Ted's and SCDs recommended at least     All the records are reviewed and case discussed with Care Management/Social Workerr. Management plans discussed with the patient, family and they are in agreement.  CODE STATUS: Full code  TOTAL TIME TAKING CARE OF THIS PATIENT: 36 minutes.   POSSIBLE D/C IN 4-5 DAYS, DEPENDING ON CLINICAL CONDITION.   Enid Baas M.D on 11/01/2017 at 8:57 AM  Between 7am to 6pm - Pager - [phone]  After 6pm go to [email or web address] - password [email or web address]  [facility]  Office  [phone]  CC: Primary care physician; Patient, No Pcp Per

## 2017-11-01 NOTE — Progress Notes (Signed)
ELECTROLYTE CONSULT  Pharmacy Consult for electrolyte management  Indication: hypokalemia  No Known Allergies  Patient Measurements: Height: 5\' 10"  (177.8 cm) Weight: 187 lb 13.3 oz (85.2 kg) IBW/kg (Calculated) : 73   Labs: Recent Labs    10/30/17 1858 10/30/17 2132 10/31/17 0454 10/31/17 0811  11/01/17 0549 11/01/17 0937 11/01/17 1409  WBC 39.5*  --  26.8*  --   --  31.8*  --   --   HGB 16.3  --  15.6  --   --  13.6  --   --   HCT 50.9  --  46.8  --   --  40.8  --   --   PLT 214  --  151  --   --  106*  --   --   APTT  --  45*  --   --   --   --   --   --   CREATININE 2.63* 2.74*  --  3.45*   < > 2.74* 2.87* 2.81*  MG 2.8*  --   --   --    < > 1.9 2.0 1.9  PHOS 16.0*  --   --   --    < > 4.5 5.4* 6.2*  ALBUMIN  --  2.2*  --  2.6*   < > 2.5* 2.4* 2.4*  PROT  --  3.8*  --  4.9*  --  4.8*  --   --   AST  --  1,027*  --  1,662*  --  2,233*  --   --   ALT  --  474*  --  659*  --  756*  --   --   ALKPHOS  --  44  --  39  --  46  --   --   BILITOT  --  1.8*  --  1.4*  --  1.2  --   --    < > = values in this interval not displayed.   Estimated Creatinine Clearance: 40.4 mL/min (A) (by C-G formula based on SCr of 2.81 mg/dL (H)).  Sodium (mmol/L)  Date Value  11/01/2017 137  05/08/2014 141   Potassium (mmol/L)  Date Value  11/01/2017 5.3 (H)  05/08/2014 4.0   Magnesium (mg/dL)  Date Value  40/98/119103/05/2018 1.9   Calcium (mg/dL)  Date Value  47/82/956203/05/2018 6.9 (L)   Calcium, Total (mg/dL)  Date Value  13/08/657809/14/2015 9.6   Albumin (g/dL)  Date Value  46/96/295203/05/2018 2.4 (L)  05/08/2014 4.1    Assessment: 27 yo male admitted with cardiac arrest and found to have ventricular fibrillation. Patient has known history of polysubstance abuse and his drug screen was positive for cocaine and opioids. Patient is critically ill and has acute renal failure. Patient is on hypothermia protocol and will be started on CRRT after dialysis catheter placement. Pharmacy has been consulted to  monitor and replace electrolytes. Electrolyte replacement should be done cautiously as patient is critically ill and on hypothermia protocol.   Goal of Therapy:  K > 4 Mg > 2   Plan:  3/10 0937: K 5.6 and Mg2.0, Phos 5.4. Patient on CRRT. Insulin drip stopped Nephrology following for CRRT Will f/u next labs.   3/10 1409: K 5.3, Mag 1.9, Phos 6.2. CRRT patient-nephrology following. CRRT now with K+ removed.  Bari MantisKristin Joash Tony PharmD Clinical Pharmacist 11/01/2017

## 2017-11-01 NOTE — Consult Note (Signed)
Reason for Consult:cardiac arrest  Referring Physician: Dr. Lonn Georgia  CC: cardiac arrest   Exam improved overnight and pt was seen moving all his extremities and trying to vocalize despite being on sedation.     Past Medical History:  Diagnosis Date  . Asthma   . Bronchitis   . Hernia cerebri [facility])     Past Surgical History:  Procedure Laterality Date  . ankle/foot surgery with metal plates      No family history on file.  Social History:  reports that he has been smoking cigarettes.  He has been smoking about 0.50 packs per day. he has never used smokeless tobacco. He reports that he does not drink alcohol or use drugs.  No Known Allergies  Medications: I have reviewed the patient's current medications.  ROS: Unable to obtain  Physical Examination: Blood pressure (!) 106/51, pulse 86, temperature 98.6 F (37 C), temperature source Core, resp. rate 18, height 5\' 10"  (1.778 m), weight 187 lb 13.3 oz (85.2 kg), SpO2 98 %.  Neurological Examination   Mental Status: Unable to test.  Cranial Nerves: II: Discs flat bilaterally III,IV, VI: EOM intact as tracking through the room  V,VII: Unable to test.  VIII: Unable to test.  IX,X: gag reflex present XI: Unable to test.  XII: Unable to test.  Motor: Moves his extremities symmetrically  RLE weaker Tone and bulk:normal tone throughout; no atrophy noted Sensory: not tested Deep Tendon Reflexes: Unable to test.  Cerebellar: Unable to test.     Laboratory Studies:   Basic Metabolic Panel: Recent Labs  Lab 10/31/17 1820 10/31/17 2029 11/01/17 0023 11/01/17 0549 11/01/17 0937  NA 143 142 139 138 138  K 5.5* 5.8* 6.8* 6.2* 5.6*  CL 104 104 103 103 101  CO2 25 28 28 25 27   GLUCOSE 52* 74 96 102* 112*  BUN 29* 27* 27* 28* 28*  CREATININE 2.90* 2.95* 2.88* 2.74* 2.87*  CALCIUM 6.6* 6.7* 6.8* 6.9* 7.0*  MG 5.1* 2.0 1.9 1.9 2.0  PHOS 4.8* 4.3 3.4 4.5 5.4*    Liver Function Tests: Recent Labs  Lab  10/30/17 1204 10/30/17 2132 10/31/17 0811  10/31/17 1820 10/31/17 2029 11/01/17 0023 11/01/17 0549 11/01/17 0937  AST 316* 1,027* 1,662*  --   --   --   --  2,233*  --   ALT 233* 474* 659*  --   --   --   --  756*  --   ALKPHOS 84 44 39  --   --   --   --  46  --   BILITOT 1.4* 1.8* 1.4*  --   --   --   --  1.2  --   PROT 8.1 3.8* 4.9*  --   --   --   --  4.8*  --   ALBUMIN 4.9 2.2* 2.6*   < > 2.5* 2.5* 2.6* 2.5* 2.4*   < > = values in this interval not displayed.   No results for input(s): LIPASE, AMYLASE in the last 168 hours. Recent Labs  Lab 10/30/17 2132  AMMONIA 32    CBC: Recent Labs  Lab 10/30/17 1204 10/30/17 1858 10/31/17 0454 11/01/17 0549  WBC 49.5* 39.5* 26.8* 31.8*  NEUTROABS 39.0* 37.0*  --   --   HGB 17.1 16.3 15.6 13.6  HCT 55.7* 50.9 46.8 40.8  MCV 100.3* 95.4 93.4 93.6  PLT 282 214 151 106*    Cardiac Enzymes: Recent Labs  Lab 10/30/17 1204 10/30/17  2132 10/31/17 0216 10/31/17 0454 10/31/17 0811 10/31/17 1820 11/01/17 0549  CKTOTAL 7,162* >50,000* >50,000* >50,000*  --  >50,000* >50,000*  TROPONINI 0.92* 3.31* 6.44*  --  6.06*  --  7.17*    BNP: Invalid input(s): POCBNP  CBG: Recent Labs  Lab 11/01/17 0025 11/01/17 0129 11/01/17 0429 11/01/17 0548 11/01/17 0623  GLUCAP 97 91 89 98 100*    Microbiology: Results for orders placed or performed during the hospital encounter of 10/30/17  MRSA PCR Screening     Status: None   Collection Time: 10/30/17  7:54 PM  Result Value Ref Range Status   MRSA by PCR NEGATIVE NEGATIVE Final    Comment:        The GeneXpert MRSA Assay (FDA approved for NASAL specimens only), is one component of a comprehensive MRSA colonization surveillance program. It is not intended to diagnose MRSA infection nor to guide or monitor treatment for MRSA infections. Performed at [facility], [address] [place], [place] [address]     Coagulation Studies: Recent Labs     10/30/17 2132 10/31/17 1419 10/31/17 2029 11/01/17 0023 11/01/17 0549  LABPROT 32.3* 19.7* 19.7* 19.5* 20.0*  INR 3.17 1.69 1.69 1.66 1.72    Urinalysis:  Recent Labs  Lab 10/30/17 1315  COLORURINE AMBER*  LABSPEC 1.023  PHURINE 5.0  GLUCOSEU NEGATIVE  HGBUR SMALL*  BILIRUBINUR NEGATIVE  KETONESUR NEGATIVE  PROTEINUR 30*  NITRITE NEGATIVE  LEUKOCYTESUR NEGATIVE    Lipid Panel:     Component Value Date/Time   TRIG 133 10/31/2017 2029    HgbA1C: No results found for: HGBA1C  Urine Drug Screen:      Component Value Date/Time   LABOPIA POSITIVE (A) 10/30/2017 1315   COCAINSCRNUR POSITIVE (A) 10/30/2017 1315   LABBENZ NONE DETECTED 10/30/2017 1315   AMPHETMU NONE DETECTED 10/30/2017 1315   THCU NONE DETECTED 10/30/2017 1315   LABBARB NONE DETECTED 10/30/2017 1315    Alcohol Level:  Recent Labs  Lab 10/30/17 1204  ETH <10     Imaging: Ct Head Wo Contrast  Result Date: 10/30/2017 CLINICAL DATA:  Patient found unresponsive today. Possible drug overdose. EXAM: CT HEAD WITHOUT CONTRAST TECHNIQUE: Contiguous axial images were obtained from the base of the skull through the vertex without intravenous contrast. COMPARISON:  None. FINDINGS: Brain: No evidence of acute infarction, hemorrhage, hydrocephalus, extra-axial collection or mass lesion/mass effect. Vascular: No hyperdense vessel or unexpected calcification. Skull: Intact. Sinuses/Orbits: Scattered ethmoid air cell disease is noted. Minimal mucosal thickening left sphenoid sinus is seen. Other: None. IMPRESSION: No acute abnormality. Mild left sphenoid sinus and scattered ethmoid air cell disease. Electronically Signed   By: Drusilla Kanner M.D.   On: 10/30/2017 15:20   Dg Chest Port 1 View  Result Date: 11/01/2017 CLINICAL DATA:  Acute respiratory failure EXAM: PORTABLE CHEST 1 VIEW COMPARISON:  10/31/2017 FINDINGS: Support devices are stable. Layering bilateral effusions have increased since prior study with  bilateral lower lobe atelectasis or infiltrates. Heart is normal size. IMPRESSION: Increasing bilateral effusions and bibasilar atelectasis or infiltrates. Electronically Signed   By: Charlett Nose M.D.   On: 11/01/2017 07:26   Dg Chest Port 1 View  Result Date: 10/31/2017 CLINICAL DATA:  Dialysis catheter placement. Overdose. Asthma bronchitis. EXAM: PORTABLE CHEST 1 VIEW COMPARISON:  Earlier today at 0718 hours. FINDINGS: 1044 hours. Right internal jugular dialysis catheter tip at low SVC. Left internal jugular line unchanged. Endotracheal tube 5.2 cm above carina. Nasogastric tube extends beyond the inferior aspect  of the film. Normal heart size for level of inspiration. No pleural effusion or pneumothorax. Lower lobe predominant interstitial and airspace disease is primarily similar. Felt to be slightly increased in the right infrahilar region. IMPRESSION: Right internal jugular dialysis catheter tip at low SVC; no pneumothorax. Slight worsening right infrahilar aeration. Otherwise similar pulmonary edema and/or infection. Electronically Signed   By: Jeronimo GreavesKyle  Talbot M.D.   On: 10/31/2017 11:14   Dg Chest Port 1 View  Result Date: 10/31/2017 CLINICAL DATA:  Intubation, overdose EXAM: PORTABLE CHEST 1 VIEW COMPARISON:  10/30/2017 FINDINGS: Support devices are stable. Areas of bilateral airspace opacities, improved in the upper lobes since prior study. Continued left perihilar and lower lobe airspace opacity could reflect infection or aspiration. No pneumothorax. No visible significant effusions. No acute bony abnormality. IMPRESSION: Improving bilateral upper lobe airspace opacities. Continued mild left perihilar and lower lung airspace opacity which could reflect pneumonia or aspiration. Electronically Signed   By: Charlett NoseKevin  Dover M.D.   On: 10/31/2017 07:41   Dg Chest Port 1 View  Result Date: 10/30/2017 CLINICAL DATA:  S/p central line placement and OG and ETT. Hx of asthma and bronchitis EXAM: PORTABLE CHEST  1 VIEW COMPARISON:  10/30/2017 at 12:25 hours FINDINGS: Endotracheal tube has been retracted, tip now well-positioned, 15 mm above the carina. Orogastric tube passes below the diaphragm into the stomach. Left internal jugular central venous line has its tip in the lower superior vena cava. Hazy airspace opacity is noted in both upper lungs and in the medial lower lungs, left greater than right, mildly increased when compared to the prior study. No pneumothorax. IMPRESSION: 1. Endotracheal tube, nasal/orogastric tube and left internal jugular central venous line are all well positioned as detailed above. 2. No pneumothorax. 3. Mild increase in hazy airspace opacities. This may reflect asymmetric edema or multi lobar pneumonia. Electronically Signed   By: Amie Portlandavid  Ormond M.D.   On: 10/30/2017 18:58   Dg Abd Portable 1v  Result Date: 10/30/2017 CLINICAL DATA:  Orogastric tube placement. EXAM: PORTABLE ABDOMEN - 1 VIEW COMPARISON:  None. FINDINGS: Orogastric tube extends below the diaphragm into the mid stomach, well positioned. Stomach is moderately distended with air. No bowel dilation to suggest obstruction. IMPRESSION: Well-positioned orogastric tube within the mid stomach. Electronically Signed   By: Amie Portlandavid  Ormond M.D.   On: 10/30/2017 18:59     Assessment/Plan:  27 y.o. male admitted 3/8 cardiac arrest with prolonged downtime.  Found ultimately to have a ventricular fibrillation and defibrillated.  Shock  Known history of polysubstance abuse.  Drug screen positive for cocaine and opiates.Pt has elevated WBC on antibiotics, renal failure that will require HD, and possibly amputation of the RLE.  Pt was on hypothermia protocol but started to wake up and follow commands.    - Discussed with family - Pt is on propofol and fentanyl - More awake and trying to verbalize which is better prognosis - Agree on continued sedation until deciding on status of the RLE by vascular - Family appears to understand the  current situation  - Will follow - hold off further imaging.    11/01/2017, 1:10 PM

## 2017-11-01 NOTE — Progress Notes (Signed)
[facility]  Daily Progress Note   Subjective: Unable to gather any subjective information as the patient is sedated and vented.  As per nursing, patient relatively stable overnight.  With increasing potassium and now on CCRT.  Increase in sedation given due to patient agitation.  Objective: Vitals:   11/01/17 0900 11/01/17 1000 11/01/17 1100 11/01/17 1137  BP: (!) 96/51 (!) 96/51 (!) 106/51   Pulse: 86 83 86   Resp: 18 18 18    Temp:      TempSrc:      SpO2:  98% 99% 98%  Weight:      Height:        Intake/Output Summary (Last 24 hours) at 11/01/2017 1345 Last data filed at 11/01/2017 1100 Gross per 24 hour  Intake 3092.88 ml  Output 2566 ml  Net 526.88 ml   Physical Exam: Responds to name being called and pain however sedated and on vent. Neck: Catheter in place.  No signs of infection or swelling noted. CV: RRR Pulmonary: On vent. CTA Bilaterally Abdomen: Soft, Nontender, Nondistended Vascular:  Right Lower Extremity: Mottling/cooler temperature to foot stable.  There continues to be an improvement in the mottling which was tracking of the patient's leg to approximately mid thigh.  There has been temperature improvement in this area as well.  Minor skin tears to the back of the calf see being a serous fluid.   Laboratory: CBC    Component Value Date/Time   WBC 31.8 (H) 11/01/2017 0549   HGB 13.6 11/01/2017 0549   HGB 16.2 05/08/2014 2307   HCT 40.8 11/01/2017 0549   HCT 48.0 05/08/2014 2307   PLT 106 (L) 11/01/2017 0549   PLT 297 05/08/2014 2307   BMET    Component Value Date/Time   NA 138 11/01/2017 0937   NA 141 05/08/2014 2307   K 5.6 (H) 11/01/2017 0937   K 4.0 05/08/2014 2307   CL 101 11/01/2017 0937   CL 103 05/08/2014 2307   CO2 27 11/01/2017 0937   CO2 31 05/08/2014 2307   GLUCOSE 112 (H) 11/01/2017 0937   GLUCOSE 95 05/08/2014 2307   BUN 28 (H) 11/01/2017 0937   BUN 19 (H) 05/08/2014 2307   CREATININE 2.87 (H) 11/01/2017  0937   CREATININE 1.02 05/08/2014 2307   CALCIUM 7.0 (L) 11/01/2017 0937   CALCIUM 9.6 05/08/2014 2307   GFRNONAA 28 (L) 11/01/2017 0937   GFRNONAA >60 05/08/2014 2307   GFRAA 33 (L) 11/01/2017 0937   GFRAA >60 05/08/2014 2307   Assessment/Planning: 27 year old male status post cardiac arrest after being found down in hotel room.  Toxicology positive for cocaine and opiates.  Prolonged CPR for approximately 45 minutes - stable 1) Right lower extremity ischemia: Prolonged ischemia to right lower extremity most likely due to trauma / patient lying on leg for an unknown amount of time depriving the leg of blood flow.  There has been small improvements to the mottling and increase in temperature to the right lower extremity from about the ankle proximally.  Foot remains stable in mottling and temperature.  There are no palpable pedal pulses to the right foot.  At this time, the patient is relatively stable and we will continue to monitor.  We are hoping to possibly take the leg at a below the knee amputation versus an above-the-knee amputation.  Due to the prolonged ischemia the patient would most likely not have any function to the foot.  We will continue to monitor.  Discussed with Dr. Charlie Pitter Yves Fodor PA-C 11/01/2017 1:45 PM

## 2017-11-01 NOTE — Progress Notes (Signed)
Chaplain encountered patient's mother in a disagreement with security about her sleeping in the waiting room. Chaplain helped deescalate the situation through supportive and active listening.  Mother declined opportunity to sleep in a lounge chair or to gain privacy in a family consult room. Chaplain prayed for patient separately.

## 2017-11-01 NOTE — Progress Notes (Signed)
ANTICOAGULATION CONSULT NOTE   Pharmacy Consult for Lovenox Indication: chest pain/ACS  No Known Allergies  Patient Measurements: Height: 5\' 10"  (177.8 cm) Weight: 187 lb 13.3 oz (85.2 kg) IBW/kg (Calculated) : 73   Vital Signs: Temp: 98.6 F (37 C) (03/10 0800) Temp Source: Core (03/10 0800) BP: 106/51 (03/10 1100) Pulse Rate: 86 (03/10 1100)  Labs: Recent Labs    10/30/17 1858 10/30/17 2132 10/31/17 0216 10/31/17 0454 10/31/17 0811  10/31/17 1820 10/31/17 2029 11/01/17 0023 11/01/17 0549 11/01/17 0937  HGB 16.3  --   --  15.6  --   --   --   --   --  13.6  --   HCT 50.9  --   --  46.8  --   --   --   --   --  40.8  --   PLT 214  --   --  151  --   --   --   --   --  106*  --   APTT  --  45*  --   --   --   --   --   --   --   --   --   LABPROT 30.2* 32.3*  --   --   --    < >  --  19.7* 19.5* 20.0*  --   INR 2.91 3.17  --   --   --    < >  --  1.69 1.66 1.72  --   CREATININE 2.63* 2.74*  --   --  3.45*   < > 2.90* 2.95* 2.88* 2.74* 2.87*  CKTOTAL  --  >50,000* >50,000* >50,000*  --   --  >50,000*  --   --  >50,000*  --   TROPONINI  --  3.31* 6.44*  --  6.06*  --   --   --   --  7.17*  --    < > = values in this interval not displayed.    Estimated Creatinine Clearance: 39.6 mL/min (A) (by C-G formula based on SCr of 2.87 mg/dL (H)).   Medical History: Past Medical History:  Diagnosis Date  . Asthma   . Bronchitis   . Hernia cerebri (HCC)     Medications:  Patient does not take any anticoagulants at home.   Assessment: 27 yo male admitted with cardiac arrest and found to have ventricular fibrillation. Patient has known history of polysubstance abuse and his drug screen was positive for cocaine and opioids. Pharmacy has been consulted to dose and monitor Lovenox for ACS.    Goal of Therapy:  Heparin level 0.3-0.7 units/ml Monitor platelets by anticoagulation protocol: Yes   Plan:  Will give Lovenox 85mg  Q12H (1mg /kg). Pharmacy will continue to  follow.   Yolanda BonineHannah Lifsey, PharmD Pharmacy Resident 11/01/2017,2:04 PM

## 2017-11-01 NOTE — Progress Notes (Signed)
Pharmacy Antibiotic Note  Levonne HubertMark A Schillaci is a 27 y.o. male admitted on 10/30/2017 with Vfib arrest and prolonged CPR. Patient with hyperkalemia, rhabdomyolysis, leukocytosis, and possible compartment syndrome. Pharmacy has been consulted for Unasyn and Vancomycin dosing. Patient with potential to transition to CRRT during current hospitalization.   3/10 :PCT 33.5   3/10 Vancomycin discontinued  Plan: Day 3-Unasyn 3g IV Q6hr.    Height: 5\' 10"  (177.8 cm) Weight: 187 lb 13.3 oz (85.2 kg) IBW/kg (Calculated) : 73  Temp (24hrs), Avg:98.5 F (36.9 C), Min:98.2 F (36.8 C), Max:98.8 F (37.1 C)  Recent Labs  Lab 10/30/17 1204 10/30/17 1858 10/30/17 2132 10/30/17 2356 10/31/17 0454 10/31/17 0811  10/31/17 1419 10/31/17 1820 10/31/17 2029 11/01/17 0023 11/01/17 0549  WBC 49.5* 39.5*  --   --  26.8*  --   --   --   --   --   --  31.8*  CREATININE 2.96* 2.63* 2.74*  --   --  3.45*   < > 3.05* 2.90* 2.95* 2.88* 2.74*  LATICACIDVEN  --   --  12.3* 12.3*  --  10.5*  --   --   --   --   --  2.9*   < > = values in this interval not displayed.    Estimated Creatinine Clearance: 41.4 mL/min (A) (by C-G formula based on SCr of 2.74 mg/dL (H)).    No Known Allergies  Antimicrobials this admission: Zosyn 3/8 x 1 Unasyn 3/8 >>  Vancomycin 3/8 >> 3/10  Dose adjustments this admission: N/A  Microbiology results: 3/8 MRSA PCR: negative  Thank you for allowing pharmacy to be a part of this patient's care.  Izaiha Lo A 11/01/2017 10:52 AM

## 2017-11-01 NOTE — Progress Notes (Signed)
ELECTROLYTE CONSULT  Pharmacy Consult for electrolyte management  Indication: hypokalemia  No Known Allergies  Patient Measurements: Height: 5\' 10"  (177.8 cm) Weight: 187 lb 13.3 oz (85.2 kg) IBW/kg (Calculated) : 73   Labs: Recent Labs    10/30/17 1858 10/30/17 2132 10/31/17 0454 10/31/17 0811  11/01/17 0023 11/01/17 0549 11/01/17 0937  WBC 39.5*  --  26.8*  --   --   --  31.8*  --   HGB 16.3  --  15.6  --   --   --  13.6  --   HCT 50.9  --  46.8  --   --   --  40.8  --   PLT 214  --  151  --   --   --  106*  --   APTT  --  45*  --   --   --   --   --   --   CREATININE 2.63* 2.74*  --  3.45*   < > 2.88* 2.74* 2.87*  MG 2.8*  --   --   --    < > 1.9 1.9 2.0  PHOS 16.0*  --   --   --    < > 3.4 4.5 5.4*  ALBUMIN  --  2.2*  --  2.6*   < > 2.6* 2.5* 2.4*  PROT  --  3.8*  --  4.9*  --   --  4.8*  --   AST  --  1,027*  --  1,662*  --   --  2,233*  --   ALT  --  474*  --  659*  --   --  756*  --   ALKPHOS  --  44  --  39  --   --  46  --   BILITOT  --  1.8*  --  1.4*  --   --  1.2  --    < > = values in this interval not displayed.   Estimated Creatinine Clearance: 39.6 mL/min (A) (by C-G formula based on SCr of 2.87 mg/dL (H)).  Sodium (mmol/L)  Date Value  11/01/2017 138  05/08/2014 141   Potassium (mmol/L)  Date Value  11/01/2017 5.6 (H)  05/08/2014 4.0   Magnesium (mg/dL)  Date Value  96/04/540903/05/2018 2.0   Calcium (mg/dL)  Date Value  81/19/147803/05/2018 7.0 (L)   Calcium, Total (mg/dL)  Date Value  29/56/213009/14/2015 9.6   Albumin (g/dL)  Date Value  86/57/846903/05/2018 2.4 (L)  05/08/2014 4.1    Assessment: 27 yo male admitted with cardiac arrest and found to have ventricular fibrillation. Patient has known history of polysubstance abuse and his drug screen was positive for cocaine and opioids. Patient is critically ill and has acute renal failure. Patient is on hypothermia protocol and will be started on CRRT after dialysis catheter placement. Pharmacy has been consulted to  monitor and replace electrolytes. Electrolyte replacement should be done cautiously as patient is critically ill and on hypothermia protocol.   Goal of Therapy:  K > 4 Mg > 2   Plan:  3/10 0937: K 5.6 and Mg2.0, Phos 5.4. Patient on CRRT. Insulin drip stopped Nephrology following for CRRT Will f/u next labs.   Bari MantisKristin Rhyland Hinderliter PharmD Clinical Pharmacist 11/01/2017

## 2017-11-02 ENCOUNTER — Inpatient Hospital Stay: Payer: Self-pay

## 2017-11-02 DIAGNOSIS — I469 Cardiac arrest, cause unspecified: Secondary | ICD-10-CM

## 2017-11-02 DIAGNOSIS — N179 Acute kidney failure, unspecified: Secondary | ICD-10-CM

## 2017-11-02 DIAGNOSIS — M6282 Rhabdomyolysis: Secondary | ICD-10-CM | POA: Insufficient documentation

## 2017-11-02 DIAGNOSIS — I96 Gangrene, not elsewhere classified: Secondary | ICD-10-CM

## 2017-11-02 DIAGNOSIS — J9600 Acute respiratory failure, unspecified whether with hypoxia or hypercapnia: Secondary | ICD-10-CM

## 2017-11-02 LAB — RENAL FUNCTION PANEL
ALBUMIN: 2.6 g/dL — AB (ref 3.5–5.0)
ALBUMIN: 2.5 g/dL — AB (ref 3.5–5.0)
ANION GAP: 10 (ref 5–15)
Albumin: 2.5 g/dL — ABNORMAL LOW (ref 3.5–5.0)
Albumin: 2.5 g/dL — ABNORMAL LOW (ref 3.5–5.0)
Anion gap: 10 (ref 5–15)
Anion gap: 10 (ref 5–15)
Anion gap: 10 (ref 5–15)
BUN: 30 mg/dL — AB (ref 6–20)
BUN: 27 mg/dL — ABNORMAL HIGH (ref 6–20)
BUN: 30 mg/dL — ABNORMAL HIGH (ref 6–20)
BUN: 26 mg/dL — AB (ref 6–20)
CALCIUM: 6.9 mg/dL — AB (ref 8.9–10.3)
CALCIUM: 7.3 mg/dL — AB (ref 8.9–10.3)
CHLORIDE: 99 mmol/L — AB (ref 101–111)
CO2: 27 mmol/L (ref 22–32)
CO2: 25 mmol/L (ref 22–32)
CO2: 26 mmol/L (ref 22–32)
CO2: 26 mmol/L (ref 22–32)
CREATININE: 2.47 mg/dL — AB (ref 0.61–1.24)
Calcium: 6.9 mg/dL — ABNORMAL LOW (ref 8.9–10.3)
Calcium: 7.6 mg/dL — ABNORMAL LOW (ref 8.9–10.3)
Chloride: 102 mmol/L (ref 101–111)
Chloride: 101 mmol/L (ref 101–111)
Chloride: 103 mmol/L (ref 101–111)
Creatinine, Ser: 2.51 mg/dL — ABNORMAL HIGH (ref 0.61–1.24)
Creatinine, Ser: 2.58 mg/dL — ABNORMAL HIGH (ref 0.61–1.24)
Creatinine, Ser: 2.37 mg/dL — ABNORMAL HIGH (ref 0.61–1.24)
GFR calc Af Amer: 39 mL/min — ABNORMAL LOW (ref >60)
GFR calc Af Amer: 41 mL/min — ABNORMAL LOW (ref >60)
GFR calc non Af Amer: 33 mL/min — ABNORMAL LOW (ref >60)
GFR calc non Af Amer: 32 mL/min — ABNORMAL LOW (ref >60)
GFR calc non Af Amer: 36 mL/min — ABNORMAL LOW (ref >60)
GFR, EST AFRICAN AMERICAN: 38 mL/min — AB (ref >60)
GFR, EST AFRICAN AMERICAN: 37 mL/min — AB (ref >60)
GFR, EST NON AFRICAN AMERICAN: 34 mL/min — AB (ref >60)
GLUCOSE: 153 mg/dL — AB (ref 65–99)
Glucose, Bld: 143 mg/dL — ABNORMAL HIGH (ref 65–99)
Glucose, Bld: 151 mg/dL — ABNORMAL HIGH (ref 65–99)
Glucose, Bld: 143 mg/dL — ABNORMAL HIGH (ref 65–99)
PHOSPHORUS: 4.6 mg/dL (ref 2.5–4.6)
POTASSIUM: 3.6 mmol/L (ref 3.5–5.1)
POTASSIUM: 3.2 mmol/L — AB (ref 3.5–5.1)
POTASSIUM: 3.9 mmol/L (ref 3.5–5.1)
Phosphorus: 5.9 mg/dL — ABNORMAL HIGH (ref 2.5–4.6)
Phosphorus: 4.2 mg/dL (ref 2.5–4.6)
Phosphorus: 3.8 mg/dL (ref 2.5–4.6)
Potassium: 4 mmol/L (ref 3.5–5.1)
SODIUM: 137 mmol/L (ref 135–145)
SODIUM: 137 mmol/L (ref 135–145)
Sodium: 136 mmol/L (ref 135–145)
Sodium: 139 mmol/L (ref 135–145)

## 2017-11-02 LAB — CBC
HCT: 35.8 % — ABNORMAL LOW (ref 40.0–52.0)
HEMATOCRIT: 34.9 % — AB (ref 40.0–52.0)
HEMOGLOBIN: 11.6 g/dL — AB (ref 13.0–18.0)
HEMOGLOBIN: 11.9 g/dL — AB (ref 13.0–18.0)
MCH: 31 pg (ref 26.0–34.0)
MCH: 31.1 pg (ref 26.0–34.0)
MCHC: 33.2 g/dL (ref 32.0–36.0)
MCHC: 33.3 g/dL (ref 32.0–36.0)
MCV: 93.5 fL (ref 80.0–100.0)
MCV: 93.6 fL (ref 80.0–100.0)
Platelets: 71 10*3/uL — ABNORMAL LOW (ref 150–440)
Platelets: 66 10*3/uL — ABNORMAL LOW (ref 150–440)
RBC: 3.74 MIL/uL — AB (ref 4.40–5.90)
RBC: 3.82 MIL/uL — AB (ref 4.40–5.90)
RDW: 12.6 % (ref 11.5–14.5)
RDW: 12.6 % (ref 11.5–14.5)
WBC: 22.8 10*3/uL — AB (ref 3.8–10.6)
WBC: 23.5 10*3/uL — AB (ref 3.8–10.6)

## 2017-11-02 LAB — GLUCOSE, CAPILLARY
GLUCOSE-CAPILLARY: 100 mg/dL — AB (ref 65–99)
GLUCOSE-CAPILLARY: 114 mg/dL — AB (ref 65–99)
GLUCOSE-CAPILLARY: 135 mg/dL — AB (ref 65–99)
GLUCOSE-CAPILLARY: 142 mg/dL — AB (ref 65–99)
Glucose-Capillary: 26 mg/dL — CL (ref 65–99)
Glucose-Capillary: 122 mg/dL — ABNORMAL HIGH (ref 65–99)
Glucose-Capillary: 138 mg/dL — ABNORMAL HIGH (ref 65–99)
Glucose-Capillary: 114 mg/dL — ABNORMAL HIGH (ref 65–99)

## 2017-11-02 LAB — HIV ANTIBODY (ROUTINE TESTING W REFLEX): HIV Screen 4th Generation wRfx: NONREACTIVE

## 2017-11-02 LAB — TROPONIN I: TROPONIN I: 2.43 ng/mL — AB (ref <0.03)

## 2017-11-02 LAB — MAGNESIUM: Magnesium: 1.8 mg/dL (ref 1.7–2.4)

## 2017-11-02 LAB — CK

## 2017-11-02 LAB — PROTIME-INR
INR: 1.14
PROTHROMBIN TIME: 14.5 s (ref 11.4–15.2)

## 2017-11-02 LAB — FIBRINOGEN: FIBRINOGEN: 705 mg/dL — AB (ref 210–475)

## 2017-11-02 LAB — APTT: APTT: 40 s — AB (ref 24–36)

## 2017-11-02 MED ORDER — HEPARIN SODIUM (PORCINE) 5000 UNIT/ML IJ SOLN: 5000.0000 [IU] | Freq: Three times a day (TID) | INTRAMUSCULAR | Status: DC

## 2017-11-02 MED ORDER — PUREFLOW DIALYSIS SOLUTION
INTRAVENOUS | Status: DC
  Administered 2017-11-02 – 2017-11-04 (×5): via INTRAVENOUS_CENTRAL
  Administered 2017-11-05: 5000 via INTRAVENOUS_CENTRAL
  Administered 2017-11-05 – 2017-11-07 (×3): via INTRAVENOUS_CENTRAL

## 2017-11-02 MED ORDER — ENOXAPARIN SODIUM 80 MG/0.8ML ~~LOC~~ SOLN: 1.0000 mg/kg | Freq: Two times a day (BID) | SUBCUTANEOUS | Status: DC

## 2017-11-02 MED ORDER — HYDROCORTISONE NA SUCCINATE PF 100 MG IJ SOLR
100.0000 mg | Freq: Three times a day (TID) | INTRAMUSCULAR | Status: DC
  Administered 2017-11-02 – 2017-11-06 (×10): 100 mg via INTRAVENOUS
  Filled 2017-11-02 (×10): qty 2

## 2017-11-02 MED ORDER — PANTOPRAZOLE SODIUM 40 MG PO PACK
40.0000 mg | PACK | Freq: Every day | ORAL | Status: DC
  Administered 2017-11-02 – 2017-11-03 (×2): 40 mg
  Filled 2017-11-02 (×2): qty 20

## 2017-11-02 MED ORDER — POTASSIUM CHLORIDE 10 MEQ/100ML IV SOLN
10.0000 meq | INTRAVENOUS | Status: DC
  Filled 2017-11-02 (×2): qty 100

## 2017-11-02 NOTE — Progress Notes (Signed)
[facility]  ROUNDING NOTE   Subjective:   UOP - Intubated, sedated On norepinephrine gtt & vasopressin gtt  CRRT UF 153m/hr   Mother and sister at bedside  CPK >50000  Objective:  Vital signs in last 24 hours:  Temp:  [97.3 F (36.3 C)-98.4 F (36.9 C)] 97.5 F (36.4 C) (03/11 0800) Pulse Rate:  [75-86] 76 (03/11 0800) Resp:  [18-20] 18 (03/11 0800) BP: (88-106)/(46-70) 99/66 (03/11 0800) SpO2:  [97 %-100 %] 100 % (03/11 0802) FiO2 (%):  [30 %-50 %] 30 % (03/11 0802) Weight:  [80 kg (176 lb 5.9 oz)] 80 kg (176 lb 5.9 oz) (03/11 0500)  Weight change: 0.2 kg (7.1 oz) Filed Weights   10/31/17 1700 11/01/17 0500 11/02/17 0500  Weight: 79.8 kg (175 lb 14.8 oz) 85.2 kg (187 lb 13.3 oz) 80 kg (176 lb 5.9 oz)    Intake/Output: I/O last 3 completed shifts: In: 4544.9 [I.V.:3014.9; NG/GT:530; IV Piggyback:1000] Out: 3937 [Urine:25; Emesis/NG output:500; OXLKGM:0102 Stool:150]   Intake/Output this shift:  Total I/O In: 231.2 [I.V.:131.2; NG/GT:100] Out: -   Physical Exam: General: Critically ill  Head: ETT  Eyes:  PERRL  Neck: Right dialysis catheter, Left TLC  Lungs:  PRVC 40%  Heart: Regular rate and rhythm  Abdomen:  Soft, nontender,   Extremities: + cyanosis  Neurologic: Intubated, sedated  Skin: Mottled lower extremities  Access: RIJ temp HD catheter 3/8 Dr. CJefferson Fuel   Basic Metabolic Panel: Recent Labs  Lab 11/01/17 0549 11/01/17 0937 11/01/17 1409 11/01/17 1803 11/01/17 2216 11/02/17 0256  NA 138 138 137 138 138 136  K 6.2* 5.6* 5.3* 4.7 3.8 3.6  CL 103 101 101 99* 102 99*  CO2 _0 GLUCOSE 102* 112* 124* 141* 143* 153*  BUN 28* 28* 29* 27* 28* 30*  CREATININE 2.74* 2.87* 2.81* 2.72* 2.71* 2.47*  CALCIUM 6.9* 7.0* 6.9* 6.9* 6.7* 6.9*  MG 1.9 2.0 1.9 2.0 1.9  --   PHOS 4.5 5.4* 6.2* 6.8* 6.8* 5.9*    Liver Function Tests: Recent Labs  Lab 10/30/17 1204 10/30/17 2132 10/31/17 0811  11/01/17 0549  11/01/17 0937 11/01/17 1409 11/01/17 1803 11/01/17 2216 11/02/17 0256  AST 316* 1,027* 1,662*  --  2,233*  --   --   --   --   --   ALT 233* 474* 659*  --  756*  --   --   --   --   --   ALKPHOS 84 44 39  --  46  --   --   --   --   --   BILITOT 1.4* 1.8* 1.4*  --  1.2  --   --   --   --   --   PROT 8.1 3.8* 4.9*  --  4.8*  --   --   --   --   --   ALBUMIN 4.9 2.2* 2.6*   < > 2.5* 2.4* 2.4* 2.4* 2.3* 2.5*   < > = values in this interval not displayed.   No results for input(s): LIPASE, AMYLASE in the last 168 hours. Recent Labs  Lab 10/30/17 2132  AMMONIA 32    CBC: Recent Labs  Lab 10/30/17 1204 10/30/17 1858 10/31/17 0454 11/01/17 0549 11/02/17 0527  WBC 49.5* 39.5* 26.8* 31.8* 22.8*  NEUTROABS 39.0* 37.0*  --   --   --   HGB 17.1 16.3 15.6 13.6 11.6*  HCT 55.7* 50.9 46.8 40.8 34.9*  MCV 100.3* 95.4 93.4 93.6 93.5  PLT 282 214 151 106* 71*    Cardiac Enzymes: Recent Labs  Lab 10/30/17 10-18-30 10/31/17 0216 10/31/17 0454 10/31/17 0811 10/31/17 1820 11/01/17 0549 11/01/17 1803 11/02/17 0527  CKTOTAL >50,000* >50,000* >50,000*  --  >50,000* >50,000* >50,000* >50,000*  TROPONINI 3.31* 6.44*  --  6.06*  --  7.17*  --  2.43*    BNP: Invalid input(s): POCBNP  CBG: Recent Labs  Lab 11/01/17 1547 11/01/17 October 18, 2006 11/02/17 0002 11/02/17 0523 11/02/17 0743  GLUCAP 114* 126* 142* 122* 100*    Microbiology: Results for orders placed or performed during the hospital encounter of 10/30/17  MRSA PCR Screening     Status: None   Collection Time: 10/30/17  7:54 PM  Result Value Ref Range Status   MRSA by PCR NEGATIVE NEGATIVE Final    Comment:        The GeneXpert MRSA Assay (FDA approved for NASAL specimens only), is one component of a comprehensive MRSA colonization surveillance program. It is not intended to diagnose MRSA infection nor to guide or monitor treatment for MRSA infections. Performed at [facility], [address]  [place], [place] [address]     Coagulation Studies: Recent Labs    10/30/17 Oct 18, 2130 10/31/17 1419 10/31/17 10-19-27 11/01/17 0023 11/01/17 0549  LABPROT 32.3* 19.7* 19.7* 19.5* 20.0*  INR 3.17 1.69 1.69 1.66 1.72    Urinalysis: Recent Labs    10/30/17 1315  COLORURINE AMBER*  LABSPEC 1.023  PHURINE 5.0  GLUCOSEU NEGATIVE  HGBUR SMALL*  BILIRUBINUR NEGATIVE  KETONESUR NEGATIVE  PROTEINUR 30*  NITRITE NEGATIVE  LEUKOCYTESUR NEGATIVE      Imaging: Dg Chest Port 1 View  Result Date: 11/02/2017 CLINICAL DATA:  Acute respiratory failure. EXAM: PORTABLE CHEST 1 VIEW COMPARISON:  11/01/2017 FINDINGS: Endotracheal tube terminates 3.3 cm above the carina. Bilateral jugular catheters terminate over the lower SVC. Enteric tube courses into the upper abdomen with tip not imaged. The cardiac silhouette is normal in size for AP technique. Layering pleural effusions and mild bilateral lower lobe airspace opacities are unchanged. IMPRESSION: Unchanged pleural effusions and bibasilar atelectasis or infiltrates. Electronically Signed   By: Logan Bores M.D.   On: 11/02/2017 07:45   Dg Chest Port 1 View  Result Date: 11/01/2017 CLINICAL DATA:  Acute respiratory failure EXAM: PORTABLE CHEST 1 VIEW COMPARISON:  10/31/2017 FINDINGS: Support devices are stable. Layering bilateral effusions have increased since prior study with bilateral lower lobe atelectasis or infiltrates. Heart is normal size. IMPRESSION: Increasing bilateral effusions and bibasilar atelectasis or infiltrates. Electronically Signed   By: Rolm Baptise M.D.   On: 11/01/2017 07:26   Dg Chest Port 1 View  Result Date: 10/31/2017 CLINICAL DATA:  Dialysis catheter placement. Overdose. Asthma bronchitis. EXAM: PORTABLE CHEST 1 VIEW COMPARISON:  Earlier today at 0718 hours. FINDINGS: 1044 hours. Right internal jugular dialysis catheter tip at low SVC. Left internal jugular line unchanged. Endotracheal tube 5.2 cm above carina. Nasogastric tube  extends beyond the inferior aspect of the film. Normal heart size for level of inspiration. No pleural effusion or pneumothorax. Lower lobe predominant interstitial and airspace disease is primarily similar. Felt to be slightly increased in the right infrahilar region. IMPRESSION: Right internal jugular dialysis catheter tip at low SVC; no pneumothorax. Slight worsening right infrahilar aeration. Otherwise similar pulmonary edema and/or infection. Electronically Signed   By: Abigail Miyamoto M.D.   On: 10/31/2017 11:14     Medications:   . sodium chloride  Stopped (10/30/17 2135)  . ampicillin-sulbactam (UNASYN) IV Stopped (11/02/17 0768)  . dextrose 20 mL/hr at 11/02/17 0600  . epinephrine Stopped (10/31/17 0901)  . fentaNYL infusion INTRAVENOUS 100 mcg/hr (11/02/17 0600)  . norepinephrine (LEVOPHED) Adult infusion 4 mcg/min (11/02/17 0215)  . potassium chloride    . propofol (DIPRIVAN) infusion 35.088 mcg/kg/min (11/02/17 0600)  . pureflow 2,000 mL/hr at 11/01/17 2352  . vasopressin (PITRESSIN) infusion - *FOR SHOCK* 0.04 Units/min (11/02/17 0600)   . chlorhexidine gluconate (MEDLINE KIT)  15 mL Mouth Rinse BID  . enoxaparin (LOVENOX) injection  1 mg/kg Subcutaneous Q12H  . feeding supplement (NEPRO CARB STEADY)  1,000 mL Per Tube Q24H  . feeding supplement (PRO-STAT SUGAR FREE 64)  60 mL Per Tube TID  . hydrocortisone sod succinate (SOLU-CORTEF) inj  100 mg Intravenous Q8H  . insulin regular  15 Units Subcutaneous Once  . mouth rinse  15 mL Mouth Rinse 10 times per day  . multivitamin  15 mL Per Tube Daily  . pantoprazole sodium  40 mg Per Tube QHS  . sodium chloride flush  10-40 mL Intracatheter Q12H   bisacodyl, dextrose, docusate sodium, fentaNYL, heparin, midazolam, midazolam, sennosides, sodium chloride flush  Assessment/ Plan:  Mr. RIELLY CORLETT is a 27 y.o. white male with asthma, bronchitis, polysubstance abuse, was admitted on3/8/2019after he was found down in a hotel for an  unknown period of time in the fetal position. Hospital course is complicated by prolonged CPR for 45 minutes in the emergency room. Ventricular fibrillation arrest, hypothermia protocol.  Urine tox screen positive for cocaine and opiates.  Hospital course complicated by acute respiratory failure and acute renal failure  1.  Acute renal failure: anuric 2.  Rhabdomyolysis: >50,000 - cocaine induced rhabdomyolysis 3.  Acute respiratory failure requiring ventilator support 4.  Shock- elevated LFTs, Troponin. Cardiogenic  Plan: Continues to require renal replacement therapy. Continues to be anuric.  Continue CRRT UF 100 cc/hr        LOS: 3 Merion Caton 3/11/20199:50 AM

## 2017-11-02 NOTE — Progress Notes (Signed)
[facility] Medicine Progess Note    SYNOPSIS   27 year old gentleman ith a past medical history of polysubstance abuse, status post prolonged cardiac arrest, respiratory failure, renal failure, rhabdomyolysis, shock  ASSESSMENT/PLAN   Status post cardiac arrest.   Patient with significant improvement in mental status.  Awake, alert and communicating.  Stopped targeted temperature management.  Respiratory failure. If no definitive plan for any intervention for his right leg will perform spontaneous awakening and breathing trial.  Septic shock. On low-dose norepinephrine and fixed dose vasopressin. Will try to wean today will add hydrocortisone  Polysubstance abuse. Known history of drug abuse. Per friends and family he snorts his drugs and are not taken IV area urine drug screen was positive for cocaine and opiates  Renal failure.  Hemodialysis catheter was placed, continues on CRRT.  Rhabdomyolysis. Rhabdomyolysis with renal failure.  CK is still greater than 50,000, on CRRT  Leukocytosis. Elevated white count on broad-spectrum antibiotic coverage.  Patient is presently on Unasyn and vancomycin  Hyperkalemia.  Decreased to 3.6 being adjusted by Nephrology  Ischemic lower extremity.  Appreciate vascular surgery input and follow-up.  Leg is somewhat warmer today and demarcating lower down the skin.  No peripheral pulses noted, dorsalis pedis, posterior tibial  Critical care time 40  minutes  VENTILATOR SETTINGS: Vent Mode: PRVC FiO2 (%):  [30 %-50 %] 30 % Set Rate:  [15 bmp-18 bmp] 18 bmp Vt Set:  [500 mL] 500 mL PEEP:  [5 cmH20] 5 cmH20 Plateau Pressure:  [14 cmH20-16 cmH20] 14 cmH20  HEMODYNAMICS: CVP:  [8 mmHg-25 mmHg] 14 mmHg  INTAKE / OUTPUT:  Intake/Output Summary (Last 24 hours) at 11/02/2017 0813 Last data filed at 11/02/2017 0600 Gross per 24 hour  Intake 3174.11 ml  Output 2287 ml  Net 887.11 ml    Name: Joshua [name] MRN: [id number] DOB: [birth year]    ADMISSION DATE:  10/30/2017  SUBJECTIVE:   Overnight patient has remained on vasopressin, norepinephrine, epinephrine, bicarbonate infusion, mechanical ventilation. Right leg still looks mottled Patient remains on hypothermia protocol. This morning patient does startle when you call his name but nothing purposefully performed  VITAL SIGNS: Temp:  [97.3 F (36.3 C)-98.4 F (36.9 C)] 97.7 F (36.5 C) (03/11 0600) Pulse Rate:  [75-86] 75 (03/11 0600) Resp:  [18-20] 18 (03/11 0600) BP: (88-106)/(46-70) 88/56 (03/11 0600) SpO2:  [97 %-100 %] 100 % (03/11 0802) FiO2 (%):  [30 %-50 %] 30 % (03/11 0802) Weight:  [80 kg (176 lb 5.9 oz)] 80 kg (176 lb 5.9 oz) (03/11 0500)  PHYSICAL EXAMINATION: Physical Examination:   VS: BP (!) 88/56   Pulse 75   Temp 97.7 F (36.5 C)   Resp 18   Ht 5\' 10"  (1.778 m)   Wt 80 kg (176 lb 5.9 oz)   SpO2 100%   BMI 25.31 kg/m   General Appearance: critically ill unresponsive in the intensive care unit Neuro:patient does startle when you call his name HEENT:pupils dilated and sluggish to respond, orally intubated, oral gastric tube, trachea is midline Pulmonary: coarse rhonchi appreciated CardiovascularNormal S1,S2.  No m/r/g.   Abdomen: soft exam, positive bowel sounds Extremities: patient's right lower extremity ecchymosis noted, pulses appreciated popliteal but no dorsalis pedisor posterior tibial.  LABORATORY PANEL:   CBC Recent Labs  Lab 11/02/17 0527  WBC 22.8*  HGB 11.6*  HCT 34.9*  PLT 71*    Chemistries  Recent Labs  Lab 11/01/17 0549  11/01/17 2216 11/02/17 0256  NA  138   < > 138 136  K 6.2*   < > 3.8 3.6  CL 103   < > 102 99*  CO2 25   < > 25 27  GLUCOSE 102*   < > 143* 153*  BUN 28*   < > 28* 30*  CREATININE 2.74*   < > 2.71* 2.47*  CALCIUM 6.9*   < > 6.7* 6.9*  MG 1.9   < > 1.9  --   PHOS 4.5   < > 6.8* 5.9*  AST 2,233*  --   --   --   ALT 756*  --   --   --   ALKPHOS 46  --    --   --   BILITOT 1.2  --   --   --    < > = values in this interval not displayed.    Recent Labs  Lab 11/01/17 0623 11/01/17 1547 11/01/17 2008 11/02/17 0002 11/02/17 0523 11/02/17 0743  GLUCAP 100* 114* 126* 142* 122* 100*   Recent Labs  Lab 10/30/17 2300 10/31/17 1747 11/01/17 0023  PHART 7.22* 7.40 7.48*  PCO2ART 56* 48 35  PO2ART 75* 69* 55*   Recent Labs  Lab 10/30/17 2132 10/31/17 0811  11/01/17 0549  11/01/17 1803 11/01/17 2216 11/02/17 0256  AST 1,027* 6,295*  --  2,233*  --   --   --   --   ALT 474* 659*  --  756*  --   --   --   --   ALKPHOS 44 39  --  46  --   --   --   --   BILITOT 1.8* 1.4*  --  1.2  --   --   --   --   ALBUMIN 2.2* 2.6*   < > 2.5*   < > 2.4* 2.3* 2.5*   < > = values in this interval not displayed.    Cardiac Enzymes Recent Labs  Lab 11/02/17 0527  TROPONINI 2.43*    RADIOLOGY:  Dg Chest Port 1 View  Result Date: 11/02/2017 CLINICAL DATA:  Acute respiratory failure. EXAM: PORTABLE CHEST 1 VIEW COMPARISON:  11/01/2017 FINDINGS: Endotracheal tube terminates 3.3 cm above the carina. Bilateral jugular catheters terminate over the lower SVC. Enteric tube courses into the upper abdomen with tip not imaged. The cardiac silhouette is normal in size for AP technique. Layering pleural effusions and mild bilateral lower lobe airspace opacities are unchanged. IMPRESSION: Unchanged pleural effusions and bibasilar atelectasis or infiltrates. Electronically Signed   By: Sebastian Ache M.D.   On: 11/02/2017 07:45   Dg Chest Port 1 View  Result Date: 11/01/2017 CLINICAL DATA:  Acute respiratory failure EXAM: PORTABLE CHEST 1 VIEW COMPARISON:  10/31/2017 FINDINGS: Support devices are stable. Layering bilateral effusions have increased since prior study with bilateral lower lobe atelectasis or infiltrates. Heart is normal size. IMPRESSION: Increasing bilateral effusions and bibasilar atelectasis or infiltrates. Electronically Signed   By: Charlett Nose  M.D.   On: 11/01/2017 07:26   Dg Chest Port 1 View  Result Date: 10/31/2017 CLINICAL DATA:  Dialysis catheter placement. Overdose. Asthma bronchitis. EXAM: PORTABLE CHEST 1 VIEW COMPARISON:  Earlier today at 0718 hours. FINDINGS: 1044 hours. Right internal jugular dialysis catheter tip at low SVC. Left internal jugular line unchanged. Endotracheal tube 5.2 cm above carina. Nasogastric tube extends beyond the inferior aspect of the film. Normal heart size for level of inspiration. No pleural effusion or pneumothorax. Lower lobe predominant interstitial  and airspace disease is primarily similar. Felt to be slightly increased in the right infrahilar region. IMPRESSION: Right internal jugular dialysis catheter tip at low SVC; no pneumothorax. Slight worsening right infrahilar aeration. Otherwise similar pulmonary edema and/or infection. Electronically Signed   By: Jeronimo GreavesKyle  Talbot M.D.   On: 10/31/2017 11:14    Tora KindredJohn Mikah Poss, DO  11/02/2017

## 2017-11-02 NOTE — Progress Notes (Signed)
ELECTROLYTE CONSULT  Pharmacy Consult for electrolyte management  Indication: hypokalemia  No Known Allergies  Patient Measurements: Height: 5\' 10"  (177.8 cm) Weight: 176 lb 5.9 oz (80 kg) IBW/kg (Calculated) : 73   Labs: Recent Labs    10/30/17 2132 10/31/17 0454 10/31/17 0811  11/01/17 0549  11/01/17 1409 11/01/17 1803 11/01/17 2216 11/02/17 0256 11/02/17 0527  WBC  --  26.8*  --   --  31.8*  --   --   --   --   --  22.8*  HGB  --  15.6  --   --  13.6  --   --   --   --   --  11.6*  HCT  --  46.8  --   --  40.8  --   --   --   --   --  34.9*  PLT  --  151  --   --  106*  --   --   --   --   --  71*  APTT 45*  --   --   --   --   --   --   --   --   --   --   CREATININE 2.74*  --  3.45*   < > 2.74*   < > 2.81* 2.72* 2.71* 2.47*  --   MG  --   --   --    < > 1.9   < > 1.9 2.0 1.9  --   --   PHOS  --   --   --    < > 4.5   < > 6.2* 6.8* 6.8* 5.9*  --   ALBUMIN 2.2*  --  2.6*   < > 2.5*   < > 2.4* 2.4* 2.3* 2.5*  --   PROT 3.8*  --  4.9*  --  4.8*  --   --   --   --   --   --   AST 1,027*  --  1,662*  --  2,233*  --   --   --   --   --   --   ALT 474*  --  659*  --  756*  --   --   --   --   --   --   ALKPHOS 44  --  39  --  46  --   --   --   --   --   --   BILITOT 1.8*  --  1.4*  --  1.2  --   --   --   --   --   --    < > = values in this interval not displayed.   Estimated Creatinine Clearance: 46 mL/min (A) (by C-G formula based on SCr of 2.47 mg/dL (H)).  Sodium (mmol/L)  Date Value  11/02/2017 136  05/08/2014 141   Potassium (mmol/L)  Date Value  11/02/2017 3.6  05/08/2014 4.0   Magnesium (mg/dL)  Date Value  45/40/981103/05/2018 1.9   Calcium (mg/dL)  Date Value  91/47/829503/06/2018 6.9 (L)   Calcium, Total (mg/dL)  Date Value  62/13/086509/14/2015 9.6   Albumin (g/dL)  Date Value  78/46/962903/06/2018 2.5 (L)  05/08/2014 4.1    Assessment: 27 yo male admitted with cardiac arrest and found to have ventricular fibrillation. Patient has known history of polysubstance abuse and  his drug screen was positive for cocaine and opioids. Patient is critically ill and has acute  renal failure. Patient is on hypothermia protocol and will be started on CRRT after dialysis catheter placement. Pharmacy has been consulted to monitor and replace electrolytes. Electrolyte replacement should be done cautiously as patient is critically ill and on hypothermia protocol.   Goal of Therapy:  K > 4 Mg > 2   Plan:  Will replace potassium (K=3.6) with KCl x2 doses.   Mg at 1.9, will follow up with next lab to see if replacement needed.   Cleopatra Cedar, PharmD Pharmacy Resident  11/02/2017

## 2017-11-02 NOTE — Progress Notes (Signed)
[facility] - [place] [facility]   PATIENT NAME: Joshua PlanasMark Hofmann    MR#:  [id number]  DATE OF BIRTH:  02/18/1991  SUBJECTIVE:  CHIEF COMPLAINT:   Chief Complaint  Patient presents with  . Drug Overdose   - remains intubated, on two pressors. -Remains on CRRT. Has ischemic right leg  REVIEW OF SYSTEMS:  Review of Systems  Unable to perform ROS: Critical illness    DRUG ALLERGIES:  No Known Allergies  VITALS:  Blood pressure 103/66, pulse 76, temperature (!) 97.3 F (36.3 C), resp. rate 18, height 5\' 10"  (1.778 m), weight 80 kg (176 lb 5.9 oz), SpO2 99 %.  PHYSICAL EXAMINATION:  Physical Exam  GENERAL:  27 y.o.-year-old patient lying in the bed, critically ill appearing EYES: Pupils equal, round, dilated, reactive to light and accommodation. No scleral icterus. Extraocular muscles intact. Swollen eyelids noted HEENT: Head atraumatic, normocephalic. Oropharynx and nasopharynx clear.  NECK:  Supple, no jugular venous distention. No thyroid enlargement, no tenderness.  LUNGS: Normal breath sounds bilaterally, no wheezing, rales,rhonchi or crepitation. No use of accessory muscles of respiration.  CARDIOVASCULAR: S1, S2 normal. No murmurs, rubs, or gallops.  ABDOMEN: Soft, nontender, nondistended. Bowel sounds present. No organomegaly or mass.  EXTREMITIES: Improved Right lower extremity erythema especially at the foot extremely cyanotic. Still cold and Unable to palpate pulses on the right foot. Likely compartment syndrome  -2+ bilateral lower extremity edema noted NEUROLOGIC: Sedated. Opening eyes to verbal commands.  PSYCHIATRIC: The patient is sedated.  SKIN: No obvious rash, lesion, or ulcer.    LABORATORY PANEL:   CBC Recent Labs  Lab 11/02/17 0527  WBC 22.8*  HGB 11.6*  HCT 34.9*  PLT 71*   ------------------------------------------------------------------------------------------------------------------  Chemistries  Recent Labs  Lab  11/01/17 0549  11/02/17 0953  NA 138   < > 137  K 6.2*   < > 3.2*  CL 103   < > 102  CO2 25   < > 25  GLUCOSE 102*   < > 143*  BUN 28*   < > 27*  CREATININE 2.74*   < > 2.51*  CALCIUM 6.9*   < > 6.9*  MG 1.9   < > 1.8  AST 2,233*  --   --   ALT 756*  --   --   ALKPHOS 46  --   --   BILITOT 1.2  --   --    < > = values in this interval not displayed.   ------------------------------------------------------------------------------------------------------------------  Cardiac Enzymes Recent Labs  Lab 11/02/17 0527  TROPONINI 2.43*   ------------------------------------------------------------------------------------------------------------------  RADIOLOGY:  Dg Chest Port 1 View  Result Date: 11/02/2017 CLINICAL DATA:  Acute respiratory failure. EXAM: PORTABLE CHEST 1 VIEW COMPARISON:  11/01/2017 FINDINGS: Endotracheal tube terminates 3.3 cm above the carina. Bilateral jugular catheters terminate over the lower SVC. Enteric tube courses into the upper abdomen with tip not imaged. The cardiac silhouette is normal in size for AP technique. Layering pleural effusions and mild bilateral lower lobe airspace opacities are unchanged. IMPRESSION: Unchanged pleural effusions and bibasilar atelectasis or infiltrates. Electronically Signed   By: Sebastian AcheAllen  Grady M.D.   On: 11/02/2017 07:45   Dg Chest Port 1 View  Result Date: 11/01/2017 CLINICAL DATA:  Acute respiratory failure EXAM: PORTABLE CHEST 1 VIEW COMPARISON:  10/31/2017 FINDINGS: Support devices are stable. Layering bilateral effusions have increased since prior study with bilateral lower lobe atelectasis or infiltrates. Heart is normal size. IMPRESSION: Increasing bilateral effusions  and bibasilar atelectasis or infiltrates. Electronically Signed   By: Charlett Nose M.D.   On: 11/01/2017 07:26    EKG:   Orders placed or performed during the hospital encounter of 10/30/17  . EKG 12-Lead  . EKG 12-Lead  . EKG 12-Lead  . EKG 12-Lead    . EKG 12-Lead  . EKG 12-Lead  . EKG 12-Lead  . EKG 12-Lead    ASSESSMENT AND PLAN:   27 year old male with known history of asthma, tobacco abuse presents to hospital secondary to an unresponsive state.  1. Acute cardio-respiratory failure-concern for drug overdose. - cardiac arrhythmia from electrolyte abnormalities likely  - decreased EF from stunned myocardium- repeat ECHO again after recovery -Intubated, on ventilator. -On sedation-wean sedation to assess mental status -Appreciate pulmonary intensive care consult and neurology consult -If no improvement, recommend repeat CT head  2. Acute renal failure and hyperkalemia-secondary to rhabdomyolysis. -Appreciate nephrology consult.  -on CRRT  3. Rhabdomyolysis-possible ischemic right leg with compartment syndrome. -Appreciate vascular consult. Redness is improving, still cold and unable to palpate pulses. -CPK still significantly elevated -Also transaminases elevated due to rhabdomyolysis. Monitor troponin as well. -If the leg does not improve, amputation might be needed, however improving at this time  4. Sepsis-significantly elevated pro calcitonin, WBC and lactic acid -Continue broad-spectrum antibiotics with  Unasyn at this time -vancomycin has been discontinued - on stress dose steroids  5. Polysubstance abuse-will be addressed after extubation. Will need psych consult as well.  6. DVT prophylaxis - Ted's and SCDs recommended at least     All the records are reviewed and case discussed with Care Management/Social Workerr. Management plans discussed with the patient, family and they are in agreement.  CODE STATUS: Full code  TOTAL TIME TAKING CARE OF THIS PATIENT: 33 minutes.   POSSIBLE D/C IN 4-5 DAYS, DEPENDING ON CLINICAL CONDITION.   Enid Baas M.D on 11/02/2017 at 12:31 PM  Between 7am to 6pm - Pager - [phone]  After 6pm go to [email or web address] - password [email or web address]  [facility]  Office  [phone]  CC: Primary care physician; Patient, No Pcp Per

## 2017-11-02 NOTE — Progress Notes (Signed)
Progress Note  Patient Name: Joshua [name] Date of Encounter: 11/02/2017  Primary Cardiologist: new  Primary Electrophysiologist: new   Patient Profile     27 y.o. male admitted 3/8 cardiac arrest with prolonged downtime.  Found ultimately to have a ventricular fibrillation and defibrillated.  Shock  Known history of polysubstance abuse.  Drug screen positive for cocaine and opiates. Renal failure with potassium 6.3 and creatinine 2.96.  CK now over 50,000.  WBC 49K >>26  Anuric being strated on RRT  He is still anuric.   Subjective   Intubated sedated and unresponsive .  According to the notes, he was responsive when sedation was turned down.  Inpatient Medications    Scheduled Meds: . chlorhexidine gluconate (MEDLINE KIT)  15 mL Mouth Rinse BID  . enoxaparin (LOVENOX) injection  1 mg/kg Subcutaneous Q12H  . feeding supplement (NEPRO CARB STEADY)  1,000 mL Per Tube Q24H  . feeding supplement (PRO-STAT SUGAR FREE 64)  60 mL Per Tube TID  . hydrocortisone sod succinate (SOLU-CORTEF) inj  100 mg Intravenous Q8H  . insulin regular  15 Units Subcutaneous Once  . mouth rinse  15 mL Mouth Rinse 10 times per day  . multivitamin  15 mL Per Tube Daily  . pantoprazole (PROTONIX) IV  40 mg Intravenous QHS  . sodium chloride flush  10-40 mL Intracatheter Q12H   Continuous Infusions: . sodium chloride Stopped (10/30/17 2135)  . ampicillin-sulbactam (UNASYN) IV Stopped (11/02/17 8786)  . dextrose 20 mL/hr at 11/02/17 0600  . epinephrine Stopped (10/31/17 0901)  . fentaNYL infusion INTRAVENOUS 100 mcg/hr (11/02/17 0600)  . norepinephrine (LEVOPHED) Adult infusion 4 mcg/min (11/02/17 0215)  . potassium chloride    . propofol (DIPRIVAN) infusion 35.088 mcg/kg/min (11/02/17 0600)  . pureflow 2,000 mL/hr at 11/01/17 2352  . vasopressin (PITRESSIN) infusion - *FOR SHOCK* 0.04 Units/min (11/02/17 0600)   PRN Meds: bisacodyl, dextrose, docusate sodium, fentaNYL, heparin, midazolam,  midazolam, sennosides, sodium chloride flush   Vital Signs    Vitals:   11/02/17 0600 11/02/17 0700 11/02/17 0800 11/02/17 0802  BP: (!) 88/56 104/67 99/66   Pulse: 75 76 76   Resp: '18 18 18   ' Temp: 97.7 F (36.5 C) (!) 97.5 F (36.4 C) (!) 97.5 F (36.4 C)   TempSrc:   Core (Comment)   SpO2: 99% 100% 99% 100%  Weight:      Height:        Intake/Output Summary (Last 24 hours) at 11/02/2017 0935 Last data filed at 11/02/2017 0800 Gross per 24 hour  Intake 3405.31 ml  Output 2190 ml  Net 1215.31 ml   Filed Weights   10/31/17 1700 11/01/17 0500 11/02/17 0500  Weight: 175 lb 14.8 oz (79.8 kg) 187 lb 13.3 oz (85.2 kg) 176 lb 5.9 oz (80 kg)    Telemetry    Sinus tach- Personally Reviewed  ECG       Physical Exam  Well developed and nourished intubated unresponsive HENT normal Neck supple  Lungs; clear bilaterally.  CV: RRR with mild tachycardia.  No heart murmurs.   Abd-soft with active BS No Clubbing cyanosis edema mottled R LE  Skin-warm and dry A & Oriented  Grossly normal sensory and motor function   Labs    Chemistry Recent Labs  Lab 10/30/17 2132 10/31/17 0811  11/01/17 0549  11/01/17 1803 11/01/17 2216 11/02/17 0256  NA 150* 147*   < > 138   < > 138 138 136  K 4.1 2.8*   < >  6.2*   < > 4.7 3.8 3.6  CL 101 101   < > 103   < > 99* 102 99*  CO2 27 25   < > 25   < > '26 25 27  ' GLUCOSE 418* 445*   < > 102*   < > 141* 143* 153*  BUN 29* 37*   < > 28*   < > 27* 28* 30*  CREATININE 2.74* 3.45*   < > 2.74*   < > 2.72* 2.71* 2.47*  CALCIUM 6.0* 6.4*   < > 6.9*   < > 6.9* 6.7* 6.9*  PROT 3.8* 4.9*  --  4.8*  --   --   --   --   ALBUMIN 2.2* 2.6*   < > 2.5*   < > 2.4* 2.3* 2.5*  AST 1,027* 1,662*  --  2,233*  --   --   --   --   ALT 474* 659*  --  756*  --   --   --   --   ALKPHOS 44 39  --  46  --   --   --   --   BILITOT 1.8* 1.4*  --  1.2  --   --   --   --   GFRNONAA 30* 23*   < > 30*   < > 30* 30* 34*  GFRAA 35* 26*   < > 35*   < > 35* 35* 39*    ANIONGAP 22* 21*   < > 10   < > '13 11 10   ' < > = values in this interval not displayed.     Hematology Recent Labs  Lab 10/31/17 0454 11/01/17 0549 11/02/17 0527  WBC 26.8* 31.8* 22.8*  RBC 5.01 4.36* 3.74*  HGB 15.6 13.6 11.6*  HCT 46.8 40.8 34.9*  MCV 93.4 93.6 93.5  MCH 31.1 31.3 31.0  MCHC 33.4 33.4 33.2  RDW 12.4 12.5 12.6  PLT 151 106* 71*    Cardiac Enzymes Recent Labs  Lab 10/31/17 0216 10/31/17 0811 11/01/17 0549 11/02/17 0527  TROPONINI 6.44* 6.06* 7.17* 2.43*   No results for input(s): TROPIPOC in the last 168 hours.   BNPNo results for input(s): BNP, PROBNP in the last 168 hours.   DDimer No results for input(s): DDIMER in the last 168 hours.   Radiology    Dg Chest Port 1 View  Result Date: 11/02/2017 CLINICAL DATA:  Acute respiratory failure. EXAM: PORTABLE CHEST 1 VIEW COMPARISON:  11/01/2017 FINDINGS: Endotracheal tube terminates 3.3 cm above the carina. Bilateral jugular catheters terminate over the lower SVC. Enteric tube courses into the upper abdomen with tip not imaged. The cardiac silhouette is normal in size for AP technique. Layering pleural effusions and mild bilateral lower lobe airspace opacities are unchanged. IMPRESSION: Unchanged pleural effusions and bibasilar atelectasis or infiltrates. Electronically Signed   By: Logan Bores M.D.   On: 11/02/2017 07:45   Dg Chest Port 1 View  Result Date: 11/01/2017 CLINICAL DATA:  Acute respiratory failure EXAM: PORTABLE CHEST 1 VIEW COMPARISON:  10/31/2017 FINDINGS: Support devices are stable. Layering bilateral effusions have increased since prior study with bilateral lower lobe atelectasis or infiltrates. Heart is normal size. IMPRESSION: Increasing bilateral effusions and bibasilar atelectasis or infiltrates. Electronically Signed   By: Rolm Baptise M.D.   On: 11/01/2017 07:26   Dg Chest Port 1 View  Result Date: 10/31/2017 CLINICAL DATA:  Dialysis catheter placement. Overdose. Asthma bronchitis.  EXAM: PORTABLE CHEST  1 VIEW COMPARISON:  Earlier today at 0718 hours. FINDINGS: 1044 hours. Right internal jugular dialysis catheter tip at low SVC. Left internal jugular line unchanged. Endotracheal tube 5.2 cm above carina. Nasogastric tube extends beyond the inferior aspect of the film. Normal heart size for level of inspiration. No pleural effusion or pneumothorax. Lower lobe predominant interstitial and airspace disease is primarily similar. Felt to be slightly increased in the right infrahilar region. IMPRESSION: Right internal jugular dialysis catheter tip at low SVC; no pneumothorax. Slight worsening right infrahilar aeration. Otherwise similar pulmonary edema and/or infection. Electronically Signed   By: Abigail Miyamoto M.D.   On: 10/31/2017 11:14    Cardiac Studies  Echo EF 35%    Assessment & Plan    1.  Ventricular fibrillation arrest in the setting of severe cocaine intoxication with rhabdomyolysis: I suspect that his ventricular arrhythmia was likely due to severe hypokalemia and other electrolyte abnormalities.  Continue supportive care.  2.  Acute systolic heart failure: EF was 35-40% by echo which is likely due to stunned myocardium.  Recommend repeat echocardiogram once the patient recovers to reevaluate his EF.  Given his young age, Coronary artery disease  is unlikely but he might require further ischemic workup if EF does not recover.  3.  Severe rhabdomyolysis: CPK remains above 50,000.  Continue supportive care.  4.  Acute renal failure : likely due to severe hypoperfusion and rhabdomyolysis.  Managed by nephrology.  5.  Elevated troponin: Likely due to supply demand ischemia.  6.  Ischemic right leg due to hypoperfusion.  Followed by vascular.    Signed, Kathlyn Sacramento, MD  11/02/2017, 9:35 AM

## 2017-11-02 NOTE — Progress Notes (Addendum)
Spoke with Dr. Kirke CorinArida and pharmacy to transition patient from therapeutic Lovenox to prophylactic heparin.   Cleopatra CedarStephanie Mettie Roylance, PharmD Pharmacy Resident

## 2017-11-02 NOTE — Progress Notes (Signed)
   11/02/17 1056  Clinical Encounter Type  Visited With Patient  Visit Type Follow-up  Spiritual Encounters  Spiritual Needs Prayer   Chaplain offered prayer for patient.

## 2017-11-02 NOTE — Progress Notes (Signed)
[facility]  Daily Progress Note   Subjective  -  Patient seen at 8:10 this am.  Remains intubated and sedated but follow command with sedation holiday.  Objective Vitals:   11/02/17 1700 11/02/17 1800 11/02/17 2000 11/02/17 2048  BP: 90/61 (!) 89/57 100/65   Pulse: 76 75 73   Resp: 18 18 18    Temp: 98.1 F (36.7 C) 98.1 F (36.7 C) 97.9 F (36.6 C)   TempSrc:  Core Core   SpO2: 100% 100% 100% 100%  Weight:      Height:        Intake/Output Summary (Last 24 hours) at 11/02/2017 2246 Last data filed at 11/02/2017 2000 Gross per 24 hour  Intake 2156.63 ml  Output 2513 ml  Net -356.37 ml    PULM  Normal effort , no use of accessory muscles CV  No JVD, RRR Abd      No distended, nontender VASC  Right foot is warm to the ankle now and the mottling continues to improve slightly.  Right pedal pulses are not palpable  Laboratory CBC    Component Value Date/Time   WBC 23.5 (H) 11/02/2017 1520   HGB 11.9 (L) 11/02/2017 1520   HGB 16.2 05/08/2014 2307   HCT 35.8 (L) 11/02/2017 1520   HCT 48.0 05/08/2014 2307   PLT 66 (L) 11/02/2017 1520   PLT 297 05/08/2014 2307    BMET    Component Value Date/Time   NA 139 11/02/2017 2124   NA 141 05/08/2014 2307   K 3.9 11/02/2017 2124   K 4.0 05/08/2014 2307   CL 103 11/02/2017 2124   CL 103 05/08/2014 2307   CO2 26 11/02/2017 2124   CO2 31 05/08/2014 2307   GLUCOSE 143 (H) 11/02/2017 2124   GLUCOSE 95 05/08/2014 2307   BUN 26 (H) 11/02/2017 2124   BUN 19 (H) 05/08/2014 2307   CREATININE 2.37 (H) 11/02/2017 2124   CREATININE 1.02 05/08/2014 2307   CALCIUM 7.6 (L) 11/02/2017 2124   CALCIUM 9.6 05/08/2014 2307   GFRNONAA 36 (L) 11/02/2017 2124   GFRNONAA >60 05/08/2014 2307   GFRAA 41 (L) 11/02/2017 2124   GFRAA >60 05/08/2014 2307    Assessment/Planning:   Right lower extremity ischemia: Prolonged ischemia to right lower extremity most likely due to trauma / patient lying on leg for an unknown  amount of time depriving the leg of blood flow.  There continues to be some improvements to the mottling and increase in temperature to the right lower extremity from about the ankle proximally.  Foot remains stable in mottling and temperature.  There are no palpable pedal pulses to the right foot.  At this time, the patient is relatively stable and the plan is to wean him from vent today.  Once the patient is off sedation we will be better able to access the extent of neurologic damage and I  Am hoping we may be able to salvage a below the knee amputation versus an above-the-knee amputation.  I think it is unlikely tha this foot would recover completely but I am not able to say this for sure quite yet. We will continue to monitor.     [name] Schnier  11/02/2017, 10:46 PM

## 2017-11-03 ENCOUNTER — Encounter: Payer: Self-pay | Admitting: Anesthesiology

## 2017-11-03 ENCOUNTER — Inpatient Hospital Stay: Payer: Self-pay | Admitting: Anesthesiology

## 2017-11-03 ENCOUNTER — Inpatient Hospital Stay: Payer: Self-pay

## 2017-11-03 ENCOUNTER — Encounter
Admission: EM | Disposition: A | Discharge status: Home Health | Payer: Self-pay | Source: Home / Self Care | Attending: Specialist

## 2017-11-03 DIAGNOSIS — I70261 Atherosclerosis of native arteries of extremities with gangrene, right leg: Secondary | ICD-10-CM

## 2017-11-03 HISTORY — PX: AMPUTATION: SHX166

## 2017-11-03 LAB — RENAL FUNCTION PANEL
ANION GAP: 8 (ref 5–15)
ANION GAP: 8 (ref 5–15)
Albumin: 2.5 g/dL — ABNORMAL LOW (ref 3.5–5.0)
Albumin: 2.5 g/dL — ABNORMAL LOW (ref 3.5–5.0)
Albumin: 2.6 g/dL — ABNORMAL LOW (ref 3.5–5.0)
Albumin: 2.6 g/dL — ABNORMAL LOW (ref 3.5–5.0)
Anion gap: 8 (ref 5–15)
Anion gap: 10 (ref 5–15)
BUN: 29 mg/dL — ABNORMAL HIGH (ref 6–20)
BUN: 30 mg/dL — ABNORMAL HIGH (ref 6–20)
BUN: 29 mg/dL — AB (ref 6–20)
BUN: 30 mg/dL — AB (ref 6–20)
CALCIUM: 7.7 mg/dL — AB (ref 8.9–10.3)
CALCIUM: 7.5 mg/dL — AB (ref 8.9–10.3)
CHLORIDE: 104 mmol/L (ref 101–111)
CHLORIDE: 104 mmol/L (ref 101–111)
CO2: 26 mmol/L (ref 22–32)
CO2: 26 mmol/L (ref 22–32)
CO2: 26 mmol/L (ref 22–32)
CO2: 25 mmol/L (ref 22–32)
CREATININE: 2.41 mg/dL — AB (ref 0.61–1.24)
Calcium: 7.6 mg/dL — ABNORMAL LOW (ref 8.9–10.3)
Calcium: 7.8 mg/dL — ABNORMAL LOW (ref 8.9–10.3)
Chloride: 104 mmol/L (ref 101–111)
Chloride: 105 mmol/L (ref 101–111)
Creatinine, Ser: 2.32 mg/dL — ABNORMAL HIGH (ref 0.61–1.24)
Creatinine, Ser: 2.23 mg/dL — ABNORMAL HIGH (ref 0.61–1.24)
Creatinine, Ser: 2.27 mg/dL — ABNORMAL HIGH (ref 0.61–1.24)
GFR calc Af Amer: 44 mL/min — ABNORMAL LOW (ref >60)
GFR calc Af Amer: 43 mL/min — ABNORMAL LOW (ref >60)
GFR calc non Af Amer: 35 mL/min — ABNORMAL LOW (ref >60)
GFR calc non Af Amer: 38 mL/min — ABNORMAL LOW (ref >60)
GFR, EST AFRICAN AMERICAN: 40 mL/min — AB (ref >60)
GFR, EST AFRICAN AMERICAN: 42 mL/min — AB (ref >60)
GFR, EST NON AFRICAN AMERICAN: 37 mL/min — AB (ref >60)
GFR, EST NON AFRICAN AMERICAN: 38 mL/min — AB (ref >60)
GLUCOSE: 136 mg/dL — AB (ref 65–99)
Glucose, Bld: 142 mg/dL — ABNORMAL HIGH (ref 65–99)
Glucose, Bld: 139 mg/dL — ABNORMAL HIGH (ref 65–99)
Glucose, Bld: 138 mg/dL — ABNORMAL HIGH (ref 65–99)
PHOSPHORUS: 3.3 mg/dL (ref 2.5–4.6)
POTASSIUM: 3.9 mmol/L (ref 3.5–5.1)
POTASSIUM: 3.9 mmol/L (ref 3.5–5.1)
Phosphorus: 3.5 mg/dL (ref 2.5–4.6)
Phosphorus: 3.3 mg/dL (ref 2.5–4.6)
Phosphorus: 3.2 mg/dL (ref 2.5–4.6)
Potassium: 4.1 mmol/L (ref 3.5–5.1)
Potassium: 4 mmol/L (ref 3.5–5.1)
SODIUM: 139 mmol/L (ref 135–145)
Sodium: 138 mmol/L (ref 135–145)
Sodium: 138 mmol/L (ref 135–145)
Sodium: 139 mmol/L (ref 135–145)

## 2017-11-03 LAB — GLUCOSE, CAPILLARY
GLUCOSE-CAPILLARY: 122 mg/dL — AB (ref 65–99)
GLUCOSE-CAPILLARY: 58 mg/dL — AB (ref 65–99)
GLUCOSE-CAPILLARY: 124 mg/dL — AB (ref 65–99)
Glucose-Capillary: 128 mg/dL — ABNORMAL HIGH (ref 65–99)
Glucose-Capillary: 120 mg/dL — ABNORMAL HIGH (ref 65–99)
Glucose-Capillary: 109 mg/dL — ABNORMAL HIGH (ref 65–99)

## 2017-11-03 LAB — CBC
HCT: 33.1 % — ABNORMAL LOW (ref 40.0–52.0)
Hemoglobin: 11.2 g/dL — ABNORMAL LOW (ref 13.0–18.0)
MCH: 31.4 pg (ref 26.0–34.0)
MCHC: 33.8 g/dL (ref 32.0–36.0)
MCV: 92.7 fL (ref 80.0–100.0)
PLATELETS: 66 10*3/uL — AB (ref 150–440)
RBC: 3.57 MIL/uL — AB (ref 4.40–5.90)
RDW: 12.5 % (ref 11.5–14.5)
WBC: 21.2 10*3/uL — AB (ref 3.8–10.6)

## 2017-11-03 LAB — FIBRIN DERIVATIVES D-DIMER (ARMC ONLY): Fibrin derivatives D-dimer (ARMC): 5708.79 ng/mL (FEU) — ABNORMAL HIGH (ref 0.00–499.00)

## 2017-11-03 LAB — PROTIME-INR
INR: 1.09
PROTHROMBIN TIME: 14 s (ref 11.4–15.2)

## 2017-11-03 LAB — TRIGLYCERIDES: Triglycerides: 264 mg/dL — ABNORMAL HIGH (ref <150)

## 2017-11-03 LAB — CK: Total CK: >50000 U/L — ABNORMAL HIGH (ref 49–397)

## 2017-11-03 LAB — MAGNESIUM
MAGNESIUM: 2 mg/dL (ref 1.7–2.4)
MAGNESIUM: 2 mg/dL (ref 1.7–2.4)
MAGNESIUM: 1.8 mg/dL (ref 1.7–2.4)

## 2017-11-03 SURGERY — AMPUTATION, ABOVE KNEE
Anesthesia: General | Laterality: Right

## 2017-11-03 MED ORDER — MIDAZOLAM HCL 2 MG/2ML IJ SOLN
INTRAMUSCULAR | Status: AC
  Filled 2017-11-03: qty 2

## 2017-11-03 MED ORDER — SODIUM CHLORIDE 0.9 % IV SOLN: 250.0000 mL | INTRAVENOUS | Status: DC | PRN

## 2017-11-03 MED ORDER — LIDOCAINE HCL (PF) 2 % IJ SOLN
INTRAMUSCULAR | Status: AC
  Filled 2017-11-03: qty 10

## 2017-11-03 MED ORDER — SODIUM CHLORIDE 0.9 % IV SOLN: Freq: Once | INTRAVENOUS | Status: DC

## 2017-11-03 MED ORDER — SODIUM CHLORIDE 0.9% FLUSH
3.0000 mL | Freq: Two times a day (BID) | INTRAVENOUS | Status: DC
  Administered 2017-11-03: 3 mL via INTRAVENOUS

## 2017-11-03 MED ORDER — ROCURONIUM BROMIDE 50 MG/5ML IV SOLN
INTRAVENOUS | Status: AC
  Filled 2017-11-03: qty 1

## 2017-11-03 MED ORDER — SODIUM CHLORIDE 0.9% FLUSH: 3.0000 mL | INTRAVENOUS | Status: DC | PRN

## 2017-11-03 MED ORDER — ROCURONIUM BROMIDE 100 MG/10ML IV SOLN
INTRAVENOUS | Status: DC | PRN
  Administered 2017-11-03: 30 mg via INTRAVENOUS
  Administered 2017-11-03: 20 mg via INTRAVENOUS

## 2017-11-03 MED ORDER — PROPOFOL 500 MG/50ML IV EMUL
INTRAVENOUS | Status: AC
  Filled 2017-11-03: qty 50

## 2017-11-03 MED ORDER — BUPIVACAINE-EPINEPHRINE 0.25% -1:200000 IJ SOLN
INTRAMUSCULAR | Status: DC | PRN
  Administered 2017-11-03: 50 mL

## 2017-11-03 MED ORDER — DEXTROSE 10 % IV SOLN
INTRAVENOUS | Status: DC | PRN
  Administered 2017-11-03: 16:00:00 via INTRAVENOUS

## 2017-11-03 MED ORDER — FENTANYL CITRATE (PF) 100 MCG/2ML IJ SOLN
INTRAMUSCULAR | Status: DC | PRN
  Administered 2017-11-03: 25 ug via INTRAVENOUS

## 2017-11-03 MED ORDER — PROPOFOL 10 MG/ML IV BOLUS
INTRAVENOUS | Status: AC
  Filled 2017-11-03: qty 20

## 2017-11-03 MED ORDER — MIDAZOLAM HCL 2 MG/2ML IJ SOLN
INTRAMUSCULAR | Status: DC | PRN
  Administered 2017-11-03: 1 mg via INTRAVENOUS

## 2017-11-03 MED ORDER — ANTICOAGULANT SODIUM CITRATE 4% (200MG/5ML) IV SOLN
5.0000 mL | Status: DC | PRN
Start: 2017-11-03 — End: 2017-11-09
  Filled 2017-11-03 (×11): qty 250

## 2017-11-03 MED ORDER — FENTANYL CITRATE (PF) 100 MCG/2ML IJ SOLN
INTRAMUSCULAR | Status: AC
  Filled 2017-11-03: qty 2

## 2017-11-03 SURGICAL SUPPLY — 41 items
BAG COUNTER SPONGE EZ (MISCELLANEOUS) IMPLANT
BANDAGE ELASTIC 6 LF NS (GAUZE/BANDAGES/DRESSINGS) ×3 IMPLANT
BLADE SAW GIGLI 510 (BLADE) IMPLANT
BLADE SAW GIGLI 510MM (BLADE)
BLADE SAW SAG 25.4X90 (BLADE) IMPLANT
BLADE SURG SZ10 CARB STEEL (BLADE) ×3 IMPLANT
BNDG COHESIVE 4X5 TAN STRL (GAUZE/BANDAGES/DRESSINGS) ×3 IMPLANT
BNDG GAUZE 4.5X4.1 6PLY STRL (MISCELLANEOUS) ×6 IMPLANT
CANISTER SUCT 1200ML W/VALVE (MISCELLANEOUS) ×3 IMPLANT
CHLORAPREP W/TINT 26ML (MISCELLANEOUS) ×3 IMPLANT
COUNTER SPONGE BAG EZ (MISCELLANEOUS)
DRAPE INCISE IOBAN 66X45 STRL (DRAPES) ×3 IMPLANT
ELECT CAUTERY BLADE 6.4 (BLADE) ×3 IMPLANT
ELECT REM PT RETURN 9FT ADLT (ELECTROSURGICAL) ×3
ELECTRODE REM PT RTRN 9FT ADLT (ELECTROSURGICAL) ×1 IMPLANT
GAUZE FLUFF 18X24 1PLY STRL (GAUZE/BANDAGES/DRESSINGS) ×9 IMPLANT
GAUZE PETRO XEROFOAM 1X8 (MISCELLANEOUS) IMPLANT
GAUZE PETRO XEROFOAM 5X9 (MISCELLANEOUS) ×6 IMPLANT
GLOVE BIO SURGEON STRL SZ7 (GLOVE) ×3 IMPLANT
GLOVE INDICATOR 7.5 STRL GRN (GLOVE) ×3 IMPLANT
GLOVE SURG SYN 8.0 (GLOVE) ×3 IMPLANT
GOWN STRL REUS W/ TWL LRG LVL3 (GOWN DISPOSABLE) ×2 IMPLANT
GOWN STRL REUS W/ TWL XL LVL3 (GOWN DISPOSABLE) ×1 IMPLANT
GOWN STRL REUS W/TWL LRG LVL3 (GOWN DISPOSABLE) ×4
GOWN STRL REUS W/TWL XL LVL3 (GOWN DISPOSABLE) ×2
HANDLE YANKAUER SUCT BULB TIP (MISCELLANEOUS) ×3 IMPLANT
KIT TURNOVER KIT A (KITS) ×3 IMPLANT
NS IRRIG 500ML POUR BTL (IV SOLUTION) ×3 IMPLANT
PACK EXTREMITY ARMC (MISCELLANEOUS) ×3 IMPLANT
PAD PREP 24X41 OB/GYN DISP (PERSONAL CARE ITEMS) ×3 IMPLANT
SPONGE LAP 18X18 5 PK (GAUZE/BANDAGES/DRESSINGS) ×6 IMPLANT
STAPLER SKIN PROX 35W (STAPLE) IMPLANT
STOCKINETTE M/LG 89821 (MISCELLANEOUS) ×3 IMPLANT
SUT ETHIBOND 0 36 GRN (SUTURE) IMPLANT
SUT SILK 2 0 (SUTURE)
SUT SILK 2-0 18XBRD TIE 12 (SUTURE) IMPLANT
SUT VIC AB 0 CT1 36 (SUTURE) IMPLANT
SUT VIC AB 3-0 SH 27 (SUTURE) ×4
SUT VIC AB 3-0 SH 27X BRD (SUTURE) ×2 IMPLANT
SUT VICRYL PLUS ABS 0 54 (SUTURE) ×3 IMPLANT
TAPE UMBIL 1/8X18 RADIOPA (MISCELLANEOUS) IMPLANT

## 2017-11-03 NOTE — Anesthesia Post-op Follow-up Note (Signed)
Anesthesia QCDR form completed.        

## 2017-11-03 NOTE — Progress Notes (Signed)
[facility]  Daily Progress Note   Subjective  -  The patient remains critically ill.  He remains intubated.  He failed a weaning trial yesterday and in fact had several episodes of emesis which is raised concern for aspiration.  He is requiring increased pressors and his CRT is not maintaining him adequately.  Furthermore his CKs remain greater than 50,000 do not appear to be coming down.  Objective Vitals:   11/03/17 1000 11/03/17 1100 11/03/17 1200 11/03/17 1300  BP: 97/71 102/76 100/63 91/61  Pulse: 63 68 67 65  Resp: 18 18 18 18   Temp: (!) 96.8 F (36 C) (!) 96.8 F (36 C) (!) 97.3 F (36.3 C) (!) 97.3 F (36.3 C)  TempSrc: Core (Comment)  Core (Comment)   SpO2: 100% 100% 99% 97%  Weight:      Height:        Intake/Output Summary (Last 24 hours) at 11/03/2017 1438 Last data filed at 11/03/2017 1200 Gross per 24 hour  Intake 1902.13 ml  Output 2926 ml  Net -1023.87 ml    PULM  Normal effort , no use of accessory muscles CV  No JVD, RRR Abd      No distended, nontender VASC  right foot appears worse today he is now cold above the ankle and the ruborous mottling is worse is more intense and on much of what seem to have improved is now back to the original.  Laboratory CBC    Component Value Date/Time   WBC 21.2 (H) 11/03/2017 0411   HGB 11.2 (L) 11/03/2017 0411   HGB 16.2 05/08/2014 2307   HCT 33.1 (L) 11/03/2017 0411   HCT 48.0 05/08/2014 2307   PLT 66 (L) 11/03/2017 0411   PLT 297 05/08/2014 2307    BMET    Component Value Date/Time   NA 138 11/03/2017 1034   NA 141 05/08/2014 2307   K 4.1 11/03/2017 1034   K 4.0 05/08/2014 2307   CL 104 11/03/2017 1034   CL 103 05/08/2014 2307   CO2 26 11/03/2017 1034   CO2 31 05/08/2014 2307   GLUCOSE 139 (H) 11/03/2017 1034   GLUCOSE 95 05/08/2014 2307   BUN 30 (H) 11/03/2017 1034   BUN 19 (H) 05/08/2014 2307   CREATININE 2.32 (H) 11/03/2017 1034   CREATININE 1.02 05/08/2014 2307   CALCIUM 7.7 (L) 11/03/2017 1034   CALCIUM 9.6 05/08/2014 2307   GFRNONAA 37 (L) 11/03/2017 1034   GFRNONAA >60 05/08/2014 2307   GFRAA 42 (L) 11/03/2017 1034   GFRAA >60 05/08/2014 2307    Assessment/Planning:  Right lower extremity ischemia:Prolonged ischemia to right lower extremity most likely due to trauma/patient lying on leg for an unknown amount of time depriving the legofblood flow.There continues to be some improvements to the mottling and increase in temperature to the rightlower extremity from about the ankleproximally. Foot is worse today with increased mottling and decreased temperature. There are no palpable pedal pulses to the right foot.  His CKs remain elevated and do not appear to be improving.  His platelet count has dropped dramatically consistent with now DIC.  Given his deterioration over the past 24 hours I will return to my original plan and will move forward with a amputation of the lower leg by a knee disarticulation with the plan for revision to a formal above-knee amputation at a later date.  At this point given  the significant deterioration in his status in association with the above-noted  parameters it appears that his ischemic foot is having a detrimental impact which is motivating the plan for surgery.  I believe this is life saving.  This will be discussed with family and we will move forward  if given permission.  He will require platelet transfusion during surgery.  Knee disarticulation is being performed given  significant reduction in bleeding complications at this level.     [name] Alivya Wegman  11/03/2017, 2:38 PM

## 2017-11-03 NOTE — Op Note (Signed)
  [facility]   OPERATIVE NOTE   PROCEDURE:  Right knee disarticulation with amputation  PRE-OPERATIVE DIAGNOSIS: Right leg ischemia; rhabdomyolysis  POST-OPERATIVE DIAGNOSIS: same as above  SURGEON: Renford DillsGregory G , MD  ANESTHESIA: general  ESTIMATED BLOOD LOSS: 75 cc  FINDING(S): Posterior muscle bellies mildly dusky  SPECIMEN(S):  Right leg disarticulated at the knee  INDICATIONS:   Joshua [name] is a 27 y.o. male who presents with right leg ischemia secondary to rhabdomyolysis.  The patient is scheduled for a right above-the-knee amputation.  I discussed in depth with the patient the risks, benefits, and alternatives to this procedure.  The patient is aware that the risk of this operation included but are not limited to:  bleeding, infection, myocardial infarction, stroke, death, failure to heal amputation wound, and possible need for more proximal amputation.  The patient is aware of the risks and agrees proceed forward with the procedure.  DESCRIPTION: After full informed written consent was obtained from the patient, the patient was taken to the operating room, and placed supine upon the operating table.  Prior to induction, the patient received IV antibiotics.  The patient was then prepped and draped in the standard fashion for a right above-the-knee amputation.  After obtaining adequate anesthesia, the patient was prepped and draped in the standard fashion for an above-the-knee amputation.    Quarter percent Marcaine with epinephrine was then infiltrated into the skin and soft tissue surrounding the knee joint.  A total of 50 cc was used.   Circumferential incision was then made at the level of the mid patella using a #10 blade scalpel.  Skin and subcutaneous tissues were divided with a combination of the scalpel and Bovie cautery.  Saphenous vein was ligated with 3-0 Vicryl.  The fascia was then incised with Bovie cautery.  The patellar tendon was  transected with Bovie cautery.  The joint space was entered and the ligaments severed with Bovie cautery.  Posterior muscle bellies were then transected with Bovie cautery the neurovascular bundle was identified clamped with Kelly clamps and ligated and divided with 0 Vicryl suture.  The wound was left open as revision will be planned when the patient is more stable   The stump was washed off and Xeroform was placed.  The incision was dressed with Xeroform and ABD pads, and  then fluffs were applied.  Kerlix was wrapped around the leg.  A large Ioban was then placed over the ACE wrap to secure the dressing. The patient was then awakened and take to the recovery room in stable condition.   COMPLICATIONS: none  CONDITION: stable  [name]   11/03/2017, 5:12 PM   This note was created with Dragon Medical transcription system. Any errors in dictation are purely unintentional.

## 2017-11-03 NOTE — Anesthesia Postprocedure Evaluation (Signed)
Anesthesia Post Note  Patient: Joshua [name]  Procedure(s) Performed: KNEE DISARTICULATION (Right )  Patient location during evaluation: PACU Anesthesia Type: General Level of consciousness: awake and alert Pain management: pain level controlled Vital Signs Assessment: post-procedure vital signs reviewed and stable Respiratory status: spontaneous breathing, nonlabored ventilation, respiratory function stable and patient connected to nasal cannula oxygen Cardiovascular status: blood pressure returned to baseline and stable Postop Assessment: no apparent nausea or vomiting Anesthetic complications: no     Last Vitals:  Vitals:   11/03/17 1900 11/03/17 1915  BP: 99/74   Pulse: (!) 58   Resp: 18   Temp: (!) 36 C   SpO2: 100% 98%    Last Pain:  Vitals:   11/03/17 1200  TempSrc: Core (Comment)                 [name]

## 2017-11-03 NOTE — Progress Notes (Signed)
[employer] [facility] [facility] [place] [facility]   PATIENT NAME: Joshua PlanasMark Gladney    MR#:  [id number]  DATE OF BIRTH:  08/03/1991  SUBJECTIVE:  CHIEF COMPLAINT:   Chief Complaint  Patient presents with  . Drug Overdose   - remains intubated, on CRRT -ischemic right leg. Platelet counts are dropping and also failed spontaneous breathing trial yesterday  REVIEW OF SYSTEMS:  Review of Systems  Unable to perform ROS: Critical illness    DRUG ALLERGIES:  No Known Allergies  VITALS:  Blood pressure 100/72, pulse 66, temperature (!) 97 F (36.1 C), temperature source Core, resp. rate 18, height 5\' 10"  (1.778 m), weight 82.3 kg (181 lb 7 oz), SpO2 100 %.  PHYSICAL EXAMINATION:  Physical Exam  GENERAL:  27 y.o.-year-old patient lying in the bed, critically ill appearing EYES: Pupils equal, round, dilated, reactive to light and accommodation. No scleral icterus. Extraocular muscles intact. Swollen eyelids noted HEENT: Head atraumatic, normocephalic. Oropharynx and nasopharynx clear.  NECK:  Supple, no jugular venous distention. No thyroid enlargement, no tenderness.  LUNGS: Normal breath sounds bilaterally, no wheezing, rales,rhonchi or crepitation. No use of accessory muscles of respiration.  CARDIOVASCULAR: S1, S2 normal. No murmurs, rubs, or gallops.  ABDOMEN: Soft, nontender, nondistended. Bowel sounds present. No organomegaly or mass.  EXTREMITIES: Improved Right lower extremity erythema with mottling especially at the foot extremely cyanotic. Still cold and Unable to palpate pulses on the right foot.  -2+ bilateral lower extremity edema noted NEUROLOGIC: Sedated.  PSYCHIATRIC: The patient is sedated.  SKIN: No obvious rash, lesion, or ulcer.    LABORATORY PANEL:   CBC Recent Labs  Lab 11/03/17 0411  WBC 21.2*  HGB 11.2*  HCT 33.1*  PLT 66*    ------------------------------------------------------------------------------------------------------------------  Chemistries  Recent Labs  Lab 11/01/17 0549  11/02/17 2124 11/03/17 0410  NA 138   < > 139 138  K 6.2*   < > 3.9 3.9  CL 103   < > 103 104  CO2 25   < > 26 26  GLUCOSE 102*   < > 143* 142*  BUN 28*   < > 26* 29*  CREATININE 2.74*   < > 2.37* 2.41*  CALCIUM 6.9*   < > 7.6* 7.6*  MG 1.9   < > 2.0  --   AST 2,233*  --   --   --   ALT 756*  --   --   --   ALKPHOS 46  --   --   --   BILITOT 1.2  --   --   --    < > = values in this interval not displayed.   ------------------------------------------------------------------------------------------------------------------  Cardiac Enzymes Recent Labs  Lab 11/02/17 0527  TROPONINI 2.43*   ------------------------------------------------------------------------------------------------------------------  RADIOLOGY:  Dg Chest Port 1 View  Result Date: 11/02/2017 CLINICAL DATA:  Acute respiratory failure. EXAM: PORTABLE CHEST 1 VIEW COMPARISON:  11/01/2017 FINDINGS: Endotracheal tube terminates 3.3 cm above the carina. Bilateral jugular catheters terminate over the lower SVC. Enteric tube courses into the upper abdomen with tip not imaged. The cardiac silhouette is normal in size for AP technique. Layering pleural effusions and mild bilateral lower lobe airspace opacities are unchanged. IMPRESSION: Unchanged pleural effusions and bibasilar atelectasis or infiltrates. Electronically Signed   By: Sebastian AcheAllen  Grady M.D.   On: 11/02/2017 07:45    EKG:   Orders placed or performed during the hospital encounter of 10/30/17  . EKG 12-Lead  . EKG 12-Lead  .  EKG 12-Lead  . EKG 12-Lead  . EKG 12-Lead  . EKG 12-Lead  . EKG 12-Lead  . EKG 12-Lead    ASSESSMENT AND PLAN:   27 year old male with known history of asthma, tobacco abuse presents to hospital secondary to an unresponsive state.  1. Acute cardio-respiratory  failure-concern for drug overdose. - cardiac arrhythmia from electrolyte abnormalities likely  - decreased EF from stunned myocardium- repeat ECHO again after recovery -Intubated, on ventilator. -On sedation-wean sedation to assess mental status-to see if any neurological recovery -Appreciate pulmonary intensive care consult and neurology consult -If no improvement, recommend repeat CT head -failed spontaneous breathing trial yesterday.  2. Acute renal failure and hyperkalemia-secondary to rhabdomyolysis. -Appreciate nephrology consult.  -on CRRT  3. Rhabdomyolysis-possible ischemic right leg with compartment syndrome. -Appreciate vascular consult. Redness is improving, still cold and unable to palpate pulses. -CPK still significantly elevated -Also transaminases elevated due to rhabdomyolysis. Monitor troponin as well. -If the leg does not improve, amputation might be needed, however improving at this time  4. Sepsis-significantly elevated pro calcitonin, WBC and lactic acid -Continue broad-spectrum antibiotics with  Unasyn at this time -vancomycin has been discontinued - on stress dose steroids  5. Polysubstance abuse-will be addressed after extubation. Will need psych consult as well.  6. DVT prophylaxis - Ted's and SCDs  Patient is critically ill.     All the records are reviewed and case discussed with Care Management/Social Workerr. Management plans discussed with the patient, family and they are in agreement.  CODE STATUS: Full code  TOTAL TIME TAKING CARE OF THIS PATIENT: 34 minutes.   POSSIBLE D/C IN ? DAYS, DEPENDING ON CLINICAL CONDITION.   Enid Baas M.D on 11/03/2017 at 8:46 AM  Between 7am to 6pm - Pager - [phone]  After 6pm go to [email or web address] - password [email or web address]  [facility]  Office  [phone]  CC: Primary care physician; Patient, No Pcp Per

## 2017-11-03 NOTE — Progress Notes (Addendum)
[facility] Critical Care Medicine Progess Note    SYNOPSIS   27 year old gentleman ith a past medical history of polysubstance abuse, status post prolonged cardiac arrest, respiratory failure, renal failure, rhabdomyolysis, shock  ASSESSMENT/PLAN   Status post cardiac arrest.   Patient with significant improvement in mental status.  Awake, alert and communicating.   Respiratory failure. Attempted spontaneous awakening and breathing trial yesterday. Patient became nauseous, excessive cough and secretions. Also had bleeding from both dialysis catheter and central line. Spontaneous breathing trial was aborted.   Septic shock. Blood pressures have improved, presently only on vasopressin. Will try to wean off today  Polysubstance abuse. Known history of drug abuse. Per friends and family he snorts his drugs and are not taken IV area urine drug screen was positive for cocaine and opiates  Renal failure.  Hemodialysis catheter was placed, continues on CRRT.  Rhabdomyolysis. Consistently elevated CK leg most likely the source, discuss with vascular surgery for consideration of amputation  Leukocytosis. Elevated white count on broad-spectrum antibiotic coverage.  Patient is presently on Unasyn and vancomycin  Thrombocytopenia. Has decreased more than 50% from admission. Although this is most likely secondary to marrow suppression from sepsis. Will convert off of heparin products and send off hit panel  Ischemic lower extremity.  Appreciate vascular surgery input and follow-up.   considering amputation secondary to ongoing abdomen now decreasing platelets.  Critical care time 35  minutes  VENTILATOR SETTINGS: Vent Mode: PRVC FiO2 (%):  [30 %] 30 % Set Rate:  [18 bmp] 18 bmp Vt Set:  [500 mL] 500 mL PEEP:  [5 cmH20] 5 cmH20 Plateau Pressure:  [17 cmH20-19 cmH20] 17 cmH20  HEMODYNAMICS: CVP:  [11 mmHg-17 mmHg] 12 mmHg  INTAKE / OUTPUT:  Intake/Output Summary (Last 24 hours) at  11/03/2017 0831 Last data filed at 11/03/2017 0800 Gross per 24 hour  Intake 2043 ml  Output 2847 ml  Net -804 ml    Name: Joshua [name] MRN: [id number] DOB: June 01, 1991    ADMISSION DATE:  10/30/2017  SUBJECTIVE:   Overnight patient has remained on vasopressin, norepinephrine, epinephrine, bicarbonate infusion, mechanical ventilation. Right leg still looks mottled Patient remains on hypothermia protocol. This morning patient does startle when you call his name but nothing purposefully performed  VITAL SIGNS: Temp:  [97 F (36.1 C)-98.1 F (36.7 C)] 97 F (36.1 C) (03/12 0800) Pulse Rate:  [65-90] 66 (03/12 0800) Resp:  [18] 18 (03/12 0800) BP: (82-114)/(54-72) 100/72 (03/12 0800) SpO2:  [95 %-100 %] 100 % (03/12 0800) FiO2 (%):  [30 %] 30 % (03/12 0745) Weight:  [82.3 kg (181 lb 7 oz)] 82.3 kg (181 lb 7 oz) (03/12 0500)  PHYSICAL EXAMINATION: Physical Examination:   VS: BP 100/72 (BP Location: Left Arm)   Pulse 66   Temp (!) 97 F (36.1 C) (Core)   Resp 18   Ht 5\' 10"  (1.778 m)   Wt 82.3 kg (181 lb 7 oz)   SpO2 100%   BMI 26.03 kg/m   General Appearance: critically ill unresponsive in the intensive care unit Neuro:patient does startle when you call his name HEENT:pupils dilated and sluggish to respond, orally intubated, oral gastric tube, trachea is midline Pulmonary: coarse rhonchi appreciated CardiovascularNormal S1,S2.  No m/r/g.   Abdomen: soft exam, positive bowel sounds Extremities: patient's right lower extremity ecchymosis noted, pulses appreciated popliteal but no dorsalis pedisor posterior tibial.  LABORATORY PANEL:   CBC Recent Labs  Lab 11/03/17 0411  WBC 21.2*  HGB 11.2*  HCT 33.1*  PLT 66*    Chemistries  Recent Labs  Lab 11/01/17 0549  11/02/17 2124 11/03/17 0410  NA 138   < > 139 138  K 6.2*   < > 3.9 3.9  CL 103   < > 103 104  CO2 25   < > 26 26  GLUCOSE 102*   < > 143* 142*  BUN 28*   < > 26* 29*  CREATININE 2.74*   < > 2.37* 2.41*    CALCIUM 6.9*   < > 7.6* 7.6*  MG 1.9   < > 2.0  --   PHOS 4.5   < > 3.8 3.5  AST 2,233*  --   --   --   ALT 756*  --   --   --   ALKPHOS 46  --   --   --   BILITOT 1.2  --   --   --    < > = values in this interval not displayed.    Recent Labs  Lab 11/02/17 1937 11/02/17 2328 11/03/17 0415 11/03/17 0736 11/03/17 0737 11/03/17 0738  GLUCAP 138* 114* 122* 58* 42* 128*   Recent Labs  Lab 10/30/17 2300 10/31/17 1747 11/01/17 0023  PHART 7.22* 7.40 7.48*  PCO2ART 56* 48 35  PO2ART 75* 69* 55*   Recent Labs  Lab 10/30/17 2132 10/31/17 0811  11/01/17 0549  11/02/17 1520 11/02/17 2124 11/03/17 0410  AST 1,027* 1,662*  --  2,233*  --   --   --   --   ALT 474* 659*  --  756*  --   --   --   --   ALKPHOS 44 39  --  46  --   --   --   --   BILITOT 1.8* 1.4*  --  1.2  --   --   --   --   ALBUMIN 2.2* 2.6*   < > 2.5*   < > 2.6* 2.5* 2.5*   < > = values in this interval not displayed.    Cardiac Enzymes Recent Labs  Lab 11/02/17 0527  TROPONINI 2.43*    RADIOLOGY:  Dg Chest Port 1 View  Result Date: 11/02/2017 CLINICAL DATA:  Acute respiratory failure. EXAM: PORTABLE CHEST 1 VIEW COMPARISON:  11/01/2017 FINDINGS: Endotracheal tube terminates 3.3 cm above the carina. Bilateral jugular catheters terminate over the lower SVC. Enteric tube courses into the upper abdomen with tip not imaged. The cardiac silhouette is normal in size for AP technique. Layering pleural effusions and mild bilateral lower lobe airspace opacities are unchanged. IMPRESSION: Unchanged pleural effusions and bibasilar atelectasis or infiltrates. Electronically Signed   By: Sebastian AcheAllen  Grady M.D.   On: 11/02/2017 07:45    Joshua KindredJohn Shontez Sermon, DO  11/03/2017

## 2017-11-03 NOTE — Anesthesia Preprocedure Evaluation (Addendum)
Anesthesia Evaluation  Patient identified by MRN, date of birth, ID band Patient awake    Reviewed: Allergy & Precautions, H&P , NPO status , Patient's Chart, lab work & pertinent test results  History of Anesthesia Complications Negative for: history of anesthetic complications  Airway Mallampati: Intubated  TM Distance: >3 FB     Dental  (+) Chipped, Poor Dentition   Pulmonary asthma , Current Smoker,           Cardiovascular + Past MI       Neuro/Psych  Neuromuscular disease negative psych ROS   GI/Hepatic negative GI ROS, Neg liver ROS,   Endo/Other  negative endocrine ROS  Renal/GU Dialysis and ARFRenal disease     Musculoskeletal   Abdominal   Peds  Hematology negative hematology ROS (+)   Anesthesia Other Findings 27 year old gentleman ith a past medical history of polysubstance abuse, status post prolonged cardiac arrest, respiratory failure, renal failure, rhabdomyolysis, shock He failed a weaning trial yesterday and in fact had several episodes of emesis which is raised concern for aspiration  Past Medical History: No date: Asthma No date: Bronchitis No date: Hernia cerebri (HCC)  Past Surgical History: No date: ankle/foot surgery with metal plates  BMI    Body Mass Index:  26.03 kg/m      Reproductive/Obstetrics negative OB ROS                           Anesthesia Physical Anesthesia Plan  ASA: V  Anesthesia Plan: General ETT   Post-op Pain Management:    Induction: Intravenous  PONV Risk Score and Plan:   Airway Management Planned: Oral ETT  Additional Equipment:   Intra-op Plan:   Post-operative Plan: Post-operative intubation/ventilation  Informed Consent: I have reviewed the patients History and Physical, chart, labs and discussed the procedure including the risks, benefits and alternatives for the proposed anesthesia with the patient or authorized  representative who has indicated his/her understanding and acceptance.   Dental Advisory Given  Plan Discussed with: Anesthesiologist, CRNA and Surgeon  Anesthesia Plan Comments: (Mother and family informed that patient ishigher risk for complications from anesthesia during this procedure due to their medical history.  They voiced understanding.  Mother and family consented for risks of anesthesia including but not limited to:  - adverse reactions to medications - damage to teeth, lips or other oral mucosa - sore throat or hoarseness - Damage to heart, brain, lungs or loss of life  They voiced understanding.)       Anesthesia Quick Evaluation

## 2017-11-03 NOTE — Transfer of Care (Signed)
Immediate Anesthesia Transfer of Care Note  Patient: Joshua [name]  Procedure(s) Performed: KNEE DISARTICULATION (Right )  Patient Location: PACU  Anesthesia Type:General  Level of Consciousness: unresponsive  Airway & Oxygen Therapy: Patient remains intubated per anesthesia plan  Post-op Assessment: Report given to RN and Post -op Vital signs reviewed and stable  Post vital signs: Reviewed and stable  Last Vitals: 101/68 69 20 100% PRVC peak 22 30% PEEP5 rate18 VT 500  Vitals:   11/03/17 1200 11/03/17 1300  BP: 100/63 91/61  Pulse: 67 65  Resp: 18 18  Temp: (!) 36.3 C (!) 36.3 C  SpO2: 99% 97%    Last Pain:  Vitals:   11/03/17 1200  TempSrc: Core (Comment)         Complications: No apparent anesthesia complications

## 2017-11-03 NOTE — Progress Notes (Signed)
[facility]  ROUNDING NOTE   Subjective:    Off norepinephrine gtt  On vasopressin gtt  CRRT UF 122m/hr   Surgery for later today.   CPK >50000  Objective:  Vital signs in last 24 hours:  Temp:  [96.8 F (36 C)-98.1 F (36.7 C)] 97.3 F (36.3 C) (03/12 1300) Pulse Rate:  [63-76] 65 (03/12 1300) Resp:  [18] 18 (03/12 1300) BP: (82-103)/(54-76) 91/61 (03/12 1300) SpO2:  [97 %-100 %] 97 % (03/12 1300) FiO2 (%):  [30 %] 30 % (03/12 1248) Weight:  [82.3 kg (181 lb 7 oz)] 82.3 kg (181 lb 7 oz) (03/12 0500)  Weight change: 2.3 kg (5 lb 1.1 oz) Filed Weights   11/01/17 0500 11/02/17 0500 11/03/17 0500  Weight: 85.2 kg (187 lb 13.3 oz) 80 kg (176 lb 5.9 oz) 82.3 kg (181 lb 7 oz)    Intake/Output: I/O last 3 completed shifts: In: 4350.5 [I.V.:2495.5; NG/GT:455; IV Piggyback:1400] Out: 4362 [Urine:25; OSHFWY:6378 Stool:950]   Intake/Output this shift:  Total I/O In: 548.8 [I.V.:388.8; NG/GT:60; IV Piggyback:100] Out: 482 [Other:482]  Physical Exam: General: Critically ill  Head: ETT  Eyes:  PERRL  Neck: Right dialysis catheter, Left TLC  Lungs:  PRVC 40%  Heart: Regular rate and rhythm  Abdomen:  Soft, nontender,   Extremities: + cyanosis  Neurologic: Intubated, sedated  Skin: Mottled lower extremities  Access: RIJ temp HD catheter 3/8 Dr. CJefferson Fuel   Basic Metabolic Panel: Recent Labs  Lab 11/01/17 1803 11/01/17 2216  11/02/17 0953 11/02/17 1520 11/02/17 2124 11/03/17 0410 11/03/17 1034  NA 138 138   < > 137 137 139 138 138  K 4.7 3.8   < > 3.2* 4.0 3.9 3.9 4.1  CL 99* 102   < > 102 101 103 104 104  CO2 26 25   < > _0 GLUCOSE 141* 143*   < > 143* 151* 143* 142* 139*  BUN 27* 28*   < > 27* 30* 26* 29* 30*  CREATININE 2.72* 2.71*   < > 2.51* 2.58* 2.37* 2.41* 2.32*  CALCIUM 6.9* 6.7*   < > 6.9* 7.3* 7.6* 7.6* 7.7*  MG 2.0 1.9  --  1.8  --  2.0  --  2.0  PHOS 6.8* 6.8*   < > 4.2 4.6 3.8 3.5 3.3   < > = values in this interval  not displayed.    Liver Function Tests: Recent Labs  Lab 10/30/17 1204 10/30/17 2132 10/31/17 0811  11/01/17 0549  11/02/17 0953 11/02/17 1520 11/02/17 2124 11/03/17 0410 11/03/17 1034  AST 316* 1,027* 1,662*  --  2,233*  --   --   --   --   --   --   ALT 233* 474* 659*  --  756*  --   --   --   --   --   --   ALKPHOS 84 44 39  --  46  --   --   --   --   --   --   BILITOT 1.4* 1.8* 1.4*  --  1.2  --   --   --   --   --   --   PROT 8.1 3.8* 4.9*  --  4.8*  --   --   --   --   --   --   ALBUMIN 4.9 2.2* 2.6*   < > 2.5*   < > 2.5* 2.6* 2.5* 2.5* 2.5*   < > =  values in this interval not displayed.   No results for input(s): LIPASE, AMYLASE in the last 168 hours. Recent Labs  Lab 10/30/17 Nov 01, 2130  AMMONIA 32    CBC: Recent Labs  Lab 10/30/17 1204 10/30/17 1858 10/31/17 0454 11/01/17 0549 11/02/17 0527 11/02/17 1520 11/03/17 0411  WBC 49.5* 39.5* 26.8* 31.8* 22.8* 23.5* 21.2*  NEUTROABS 39.0* 37.0*  --   --   --   --   --   HGB 17.1 16.3 15.6 13.6 11.6* 11.9* 11.2*  HCT 55.7* 50.9 46.8 40.8 34.9* 35.8* 33.1*  MCV 100.3* 95.4 93.4 93.6 93.5 93.6 92.7  PLT 282 214 151 106* 71* 66* 66*    Cardiac Enzymes: Recent Labs  Lab 10/30/17 2130/11/01 10/31/17 0216  10/31/17 0811  11/01/17 0549 11/01/17 1803 11/02/17 0527 11/02/17 1520 11/03/17 0411  CKTOTAL >50,000* >50,000*   < >  --    < > >50,000* >50,000* >50,000* >50,000* >50,000*  TROPONINI 3.31* 6.44*  --  6.06*  --  7.17*  --  2.43*  --   --    < > = values in this interval not displayed.    BNP: Invalid input(s): POCBNP  CBG: Recent Labs  Lab 11/03/17 0415 11/03/17 0736 11/03/17 0737 11/03/17 0738 11/03/17 1130  GLUCAP 122* 70* 42* 128* 120*    Microbiology: Results for orders placed or performed during the hospital encounter of 10/30/17  MRSA PCR Screening     Status: None   Collection Time: 10/30/17  7:54 PM  Result Value Ref Range Status   MRSA by PCR NEGATIVE NEGATIVE Final    Comment:         The GeneXpert MRSA Assay (FDA approved for NASAL specimens only), is one component of a comprehensive MRSA colonization surveillance program. It is not intended to diagnose MRSA infection nor to guide or monitor treatment for MRSA infections. Performed at [facility], [address] [place], [place] [address]     Coagulation Studies: Recent Labs    10/31/17 11/02/2027 11/01/17 0023 11/01/17 0549 11/02/17 1520 11/03/17 0411  LABPROT 19.7* 19.5* 20.0* 14.5 14.0  INR 1.69 1.66 1.72 1.14 1.09    Urinalysis: No results for input(s): COLORURINE, LABSPEC, PHURINE, GLUCOSEU, HGBUR, BILIRUBINUR, KETONESUR, PROTEINUR, UROBILINOGEN, NITRITE, LEUKOCYTESUR in the last 72 hours.  Invalid input(s): APPERANCEUR    Imaging: Dg Chest Port 1 View  Result Date: 11/03/2017 CLINICAL DATA:  Intubation. EXAM: PORTABLE CHEST 1 VIEW COMPARISON:  11/02/2017. FINDINGS: Endotracheal tube, right IJ line, left IJ line, NG tube in stable position. Heart size stable. Interim clearing of left pleural effusion. Persistent right pleural effusion. Persistent mild right base atelectasis/infiltrate. No pneumothorax. Heart size stable. IMPRESSION: 1.  Lines and tubes in stable position. 2. Interim clearing of left pleural effusion. Right pleural effusion and mild right base atelectasis/infiltrate again noted without interim change. Electronically Signed   By: Marcello Moores  Register   On: 11/03/2017 10:47   Dg Chest Port 1 View  Result Date: 11/02/2017 CLINICAL DATA:  Acute respiratory failure. EXAM: PORTABLE CHEST 1 VIEW COMPARISON:  11/01/2017 FINDINGS: Endotracheal tube terminates 3.3 cm above the carina. Bilateral jugular catheters terminate over the lower SVC. Enteric tube courses into the upper abdomen with tip not imaged. The cardiac silhouette is normal in size for AP technique. Layering pleural effusions and mild bilateral lower lobe airspace opacities are unchanged. IMPRESSION: Unchanged pleural effusions  and bibasilar atelectasis or infiltrates. Electronically Signed   By: Logan Bores M.D.   On: 11/02/2017 07:45  Medications:   . sodium chloride Stopped (10/30/17 2135)  . [MAR Hold] sodium chloride    . [MAR Hold] sodium chloride Stopped (11/03/17 1130)  . [MAR Hold] ampicillin-sulbactam (UNASYN) IV Stopped (11/03/17 1219)  . [MAR Hold] anticoagulant sodium citrate    . dextrose 20 mL/hr at 11/03/17 0600  . fentaNYL infusion INTRAVENOUS 10 mcg/hr (11/03/17 1549)  . [MAR Hold] norepinephrine (LEVOPHED) Adult infusion 0 mcg/min (11/03/17 1100)  . [MAR Hold] propofol (DIPRIVAN) infusion 35 mcg/kg/min (11/03/17 1550)  . pureflow 2,000 mL/hr at 11/03/17 1051  . vasopressin (PITRESSIN) infusion - *FOR SHOCK* 0.05 Units/min (11/03/17 1611)   . [MAR Hold] chlorhexidine gluconate (MEDLINE KIT)  15 mL Mouth Rinse BID  . [MAR Hold] feeding supplement (NEPRO CARB STEADY)  1,000 mL Per Tube Q24H  . [MAR Hold] feeding supplement (PRO-STAT SUGAR FREE 64)  60 mL Per Tube TID  . [MAR Hold] hydrocortisone sod succinate (SOLU-CORTEF) inj  100 mg Intravenous Q8H  . [MAR Hold] mouth rinse  15 mL Mouth Rinse 10 times per day  . [MAR Hold] multivitamin  15 mL Per Tube Daily  . [MAR Hold] pantoprazole sodium  40 mg Per Tube QHS  . [MAR Hold] sodium chloride flush  10-40 mL Intracatheter Q12H  . [MAR Hold] sodium chloride flush  3 mL Intravenous Q12H   [MAR Hold] sodium chloride, [MAR Hold] anticoagulant sodium citrate, [MAR Hold] bisacodyl, [MAR Hold] dextrose, [MAR Hold] docusate sodium, [MAR Hold] fentaNYL, [MAR Hold] midazolam, [MAR Hold] sennosides, [MAR Hold] sodium chloride flush, [MAR Hold] sodium chloride flush  Assessment/ Plan:  [name] is a 27 y.o. white male with asthma, bronchitis, polysubstance abuse, was admitted on3/8/2019after he was found down in a hotel for an unknown period of time in the fetal position. Hospital course is complicated by prolonged CPR for 45 minutes in the  emergency room. Ventricular fibrillation arrest, hypothermia protocol.  Urine tox screen positive for cocaine and opiates.  Hospital course complicated by acute respiratory failure and acute renal failure  1.  Acute renal failure: anuric 2.  Rhabdomyolysis: >50,000 - cocaine induced rhabdomyolysis 3.  Acute respiratory failure requiring ventilator support 4.  Shock- elevated LFTs, Troponin. Cardiogenic  Plan: Continues to require renal replacement therapy. Continues to be anuric.  Continue CRRT UF 100 cc/hr        LOS: 4 Sahvanna Mcmanigal 3/12/20194:37 PM

## 2017-11-03 NOTE — Progress Notes (Signed)
Chaplain followed up with Pt. Spoke with nurse about Pt condition. Nurse said mother was upset. Chap. Found mother in waiting area. She was on the phone. Chaplain will follow up to speak with mother and visit Pt.    11/03/17 1000  Clinical Encounter Type  Visited With Family  Visit Type Follow-up  Referral From Chaplain  Spiritual Encounters  Spiritual Needs Emotional

## 2017-11-03 NOTE — Progress Notes (Signed)
   11/03/17 1345  Clinical Encounter Type  Visited With Patient not available  Visit Type Follow-up  Spiritual Encounters  Spiritual Needs Prayer   Chaplain attempted follow up; family not present; offered silent prayer for patient.

## 2017-11-04 ENCOUNTER — Encounter: Payer: Self-pay | Admitting: Vascular Surgery

## 2017-11-04 ENCOUNTER — Other Ambulatory Visit: Payer: Self-pay

## 2017-11-04 ENCOUNTER — Inpatient Hospital Stay: Payer: Self-pay

## 2017-11-04 DIAGNOSIS — Z515 Encounter for palliative care: Secondary | ICD-10-CM

## 2017-11-04 DIAGNOSIS — Z7189 Other specified counseling: Secondary | ICD-10-CM

## 2017-11-04 DIAGNOSIS — Z9911 Dependence on respirator [ventilator] status: Secondary | ICD-10-CM

## 2017-11-04 DIAGNOSIS — J9601 Acute respiratory failure with hypoxia: Secondary | ICD-10-CM

## 2017-11-04 LAB — RENAL FUNCTION PANEL
ALBUMIN: 2.4 g/dL — AB (ref 3.5–5.0)
ALBUMIN: 2.3 g/dL — AB (ref 3.5–5.0)
ALBUMIN: 2.2 g/dL — AB (ref 3.5–5.0)
ALBUMIN: 2.3 g/dL — AB (ref 3.5–5.0)
ANION GAP: 5 (ref 5–15)
ANION GAP: 7 (ref 5–15)
Anion gap: 7 (ref 5–15)
Anion gap: 9 (ref 5–15)
BUN: 31 mg/dL — AB (ref 6–20)
BUN: 32 mg/dL — AB (ref 6–20)
BUN: 30 mg/dL — ABNORMAL HIGH (ref 6–20)
BUN: 29 mg/dL — ABNORMAL HIGH (ref 6–20)
CALCIUM: 7.5 mg/dL — AB (ref 8.9–10.3)
CALCIUM: 7.9 mg/dL — AB (ref 8.9–10.3)
CHLORIDE: 104 mmol/L (ref 101–111)
CHLORIDE: 107 mmol/L (ref 101–111)
CO2: 27 mmol/L (ref 22–32)
CO2: 26 mmol/L (ref 22–32)
CO2: 25 mmol/L (ref 22–32)
CO2: 24 mmol/L (ref 22–32)
CREATININE: 2.4 mg/dL — AB (ref 0.61–1.24)
CREATININE: 2.25 mg/dL — AB (ref 0.61–1.24)
Calcium: 7.3 mg/dL — ABNORMAL LOW (ref 8.9–10.3)
Calcium: 7.4 mg/dL — ABNORMAL LOW (ref 8.9–10.3)
Chloride: 106 mmol/L (ref 101–111)
Chloride: 106 mmol/L (ref 101–111)
Creatinine, Ser: 2.28 mg/dL — ABNORMAL HIGH (ref 0.61–1.24)
Creatinine, Ser: 2.07 mg/dL — ABNORMAL HIGH (ref 0.61–1.24)
GFR calc Af Amer: 43 mL/min — ABNORMAL LOW (ref >60)
GFR calc Af Amer: 41 mL/min — ABNORMAL LOW (ref >60)
GFR calc non Af Amer: 35 mL/min — ABNORMAL LOW (ref >60)
GFR, EST AFRICAN AMERICAN: 49 mL/min — AB (ref >60)
GFR, EST AFRICAN AMERICAN: 44 mL/min — AB (ref >60)
GFR, EST NON AFRICAN AMERICAN: 37 mL/min — AB (ref >60)
GFR, EST NON AFRICAN AMERICAN: 42 mL/min — AB (ref >60)
GFR, EST NON AFRICAN AMERICAN: 38 mL/min — AB (ref >60)
GLUCOSE: 149 mg/dL — AB (ref 65–99)
Glucose, Bld: 114 mg/dL — ABNORMAL HIGH (ref 65–99)
Glucose, Bld: 104 mg/dL — ABNORMAL HIGH (ref 65–99)
Glucose, Bld: 90 mg/dL (ref 65–99)
PHOSPHORUS: 2.1 mg/dL — AB (ref 2.5–4.6)
PHOSPHORUS: 2 mg/dL — AB (ref 2.5–4.6)
PHOSPHORUS: 1.8 mg/dL — AB (ref 2.5–4.6)
POTASSIUM: 3.8 mmol/L (ref 3.5–5.1)
POTASSIUM: 4.3 mmol/L (ref 3.5–5.1)
Phosphorus: 3.7 mg/dL (ref 2.5–4.6)
Potassium: 3.6 mmol/L (ref 3.5–5.1)
Potassium: 3.7 mmol/L (ref 3.5–5.1)
SODIUM: 138 mmol/L (ref 135–145)
SODIUM: 139 mmol/L (ref 135–145)
Sodium: 137 mmol/L (ref 135–145)
Sodium: 139 mmol/L (ref 135–145)

## 2017-11-04 LAB — CBC WITH DIFFERENTIAL/PLATELET
BAND NEUTROPHILS: 1 %
BLASTS: 0 %
Basophils Absolute: 0 10*3/uL (ref 0–0.1)
Basophils Relative: 0 %
Eosinophils Absolute: 0.2 10*3/uL (ref 0–0.7)
Eosinophils Relative: 1 %
HEMATOCRIT: 26.4 % — AB (ref 40.0–52.0)
Hemoglobin: 8.8 g/dL — ABNORMAL LOW (ref 13.0–18.0)
LYMPHS PCT: 20 %
Lymphs Abs: 3.5 10*3/uL (ref 1.0–3.6)
MCH: 30.4 pg (ref 26.0–34.0)
MCHC: 33.2 g/dL (ref 32.0–36.0)
MCV: 91.5 fL (ref 80.0–100.0)
MONOS PCT: 10 %
Metamyelocytes Relative: 3 %
Monocytes Absolute: 1.8 10*3/uL — ABNORMAL HIGH (ref 0.2–1.0)
Myelocytes: 3 %
Neutro Abs: 12.2 10*3/uL — ABNORMAL HIGH (ref 1.4–6.5)
Neutrophils Relative %: 62 %
OTHER: 0 %
Platelets: 130 10*3/uL — ABNORMAL LOW (ref 150–440)
Promyelocytes Absolute: 0 %
RBC: 2.89 MIL/uL — AB (ref 4.40–5.90)
RDW: 13.6 % (ref 11.5–14.5)
WBC: 17.7 10*3/uL — AB (ref 3.8–10.6)
nRBC: 0 /100 WBC

## 2017-11-04 LAB — PREPARE PLATELET PHERESIS
UNIT DIVISION: 0
Unit division: 0

## 2017-11-04 LAB — CBC
HCT: 26.9 % — ABNORMAL LOW (ref 40.0–52.0)
HCT: 21.7 % — ABNORMAL LOW (ref 40.0–52.0)
HEMATOCRIT: 22.6 % — AB (ref 40.0–52.0)
HEMATOCRIT: 25.2 % — AB (ref 40.0–52.0)
HEMOGLOBIN: 7.5 g/dL — AB (ref 13.0–18.0)
HEMOGLOBIN: 8.4 g/dL — AB (ref 13.0–18.0)
Hemoglobin: 8.9 g/dL — ABNORMAL LOW (ref 13.0–18.0)
Hemoglobin: 7 g/dL — ABNORMAL LOW (ref 13.0–18.0)
MCH: 31 pg (ref 26.0–34.0)
MCH: 30.7 pg (ref 26.0–34.0)
MCH: 30.5 pg (ref 26.0–34.0)
MCH: 30.7 pg (ref 26.0–34.0)
MCHC: 32.9 g/dL (ref 32.0–36.0)
MCHC: 32.4 g/dL (ref 32.0–36.0)
MCHC: 33.1 g/dL (ref 32.0–36.0)
MCHC: 33.4 g/dL (ref 32.0–36.0)
MCV: 94.1 fL (ref 80.0–100.0)
MCV: 94.7 fL (ref 80.0–100.0)
MCV: 92.4 fL (ref 80.0–100.0)
MCV: 92 fL (ref 80.0–100.0)
Platelets: 156 10*3/uL (ref 150–440)
Platelets: 140 10*3/uL — ABNORMAL LOW (ref 150–440)
Platelets: 132 10*3/uL — ABNORMAL LOW (ref 150–440)
Platelets: 122 10*3/uL — ABNORMAL LOW (ref 150–440)
RBC: 2.86 MIL/uL — ABNORMAL LOW (ref 4.40–5.90)
RBC: 2.29 MIL/uL — ABNORMAL LOW (ref 4.40–5.90)
RBC: 2.45 MIL/uL — AB (ref 4.40–5.90)
RBC: 2.74 MIL/uL — AB (ref 4.40–5.90)
RDW: 12.4 % (ref 11.5–14.5)
RDW: 12.3 % (ref 11.5–14.5)
RDW: 13.1 % (ref 11.5–14.5)
RDW: 13.3 % (ref 11.5–14.5)
WBC: 18.1 10*3/uL — ABNORMAL HIGH (ref 3.8–10.6)
WBC: 18.5 10*3/uL — ABNORMAL HIGH (ref 3.8–10.6)
WBC: 19.4 10*3/uL — ABNORMAL HIGH (ref 3.8–10.6)
WBC: 14.8 10*3/uL — ABNORMAL HIGH (ref 3.8–10.6)

## 2017-11-04 LAB — BPAM PLATELET PHERESIS
BLOOD PRODUCT EXPIRATION DATE: 201903131636
Blood Product Expiration Date: 201903131628
ISSUE DATE / TIME: 201903121643
ISSUE DATE / TIME: 201903121643
Unit Type and Rh: 6200
Unit Type and Rh: 7300

## 2017-11-04 LAB — MAGNESIUM
MAGNESIUM: 1.7 mg/dL (ref 1.7–2.4)
Magnesium: 1.8 mg/dL (ref 1.7–2.4)

## 2017-11-04 LAB — HEPATIC FUNCTION PANEL
ALT: 485 U/L — ABNORMAL HIGH (ref 17–63)
AST: 1253 U/L — ABNORMAL HIGH (ref 15–41)
Albumin: 2.5 g/dL — ABNORMAL LOW (ref 3.5–5.0)
Alkaline Phosphatase: 73 U/L (ref 38–126)
Bilirubin, Direct: 0.1 mg/dL (ref 0.1–0.5)
Indirect Bilirubin: 0.6 mg/dL (ref 0.3–0.9)
Total Bilirubin: 0.7 mg/dL (ref 0.3–1.2)
Total Protein: 5.5 g/dL — ABNORMAL LOW (ref 6.5–8.1)

## 2017-11-04 LAB — GLUCOSE, CAPILLARY
GLUCOSE-CAPILLARY: 42 mg/dL — AB (ref 65–99)
Glucose-Capillary: 104 mg/dL — ABNORMAL HIGH (ref 65–99)
Glucose-Capillary: 130 mg/dL — ABNORMAL HIGH (ref 65–99)
Glucose-Capillary: 84 mg/dL (ref 65–99)
Glucose-Capillary: 98 mg/dL (ref 65–99)
Glucose-Capillary: 99 mg/dL (ref 65–99)

## 2017-11-04 LAB — PROTIME-INR
INR: 1.12
Prothrombin Time: 14.3 seconds (ref 11.4–15.2)

## 2017-11-04 LAB — CK
CK TOTAL: 29343 U/L — AB (ref 49–397)
Total CK: 30625 U/L — ABNORMAL HIGH (ref 49–397)
Total CK: 26190 U/L — ABNORMAL HIGH (ref 49–397)

## 2017-11-04 LAB — FIBRINOGEN: Fibrinogen: 575 mg/dL — ABNORMAL HIGH (ref 210–475)

## 2017-11-04 LAB — PREPARE RBC (CROSSMATCH)

## 2017-11-04 LAB — HEPARIN INDUCED PLATELET AB (HIT ANTIBODY): HEPARIN INDUCED PLT AB: 0.26 {OD_unit} (ref 0.000–0.400)

## 2017-11-04 MED ORDER — PANTOPRAZOLE SODIUM 40 MG IV SOLR
40.0000 mg | Freq: Every day | INTRAVENOUS | Status: DC
  Administered 2017-11-05: 40 mg via INTRAVENOUS
  Filled 2017-11-04: qty 40

## 2017-11-04 MED ORDER — SODIUM CHLORIDE 0.9 % IV SOLN
Freq: Once | INTRAVENOUS | Status: AC
  Administered 2017-11-04: 14:00:00 via INTRAVENOUS

## 2017-11-04 MED ORDER — ATROPINE SULFATE 1 MG/10ML IJ SOSY
1.0000 mg | PREFILLED_SYRINGE | Freq: Once | INTRAMUSCULAR | Status: AC
  Administered 2017-11-04: 1 mg via INTRAVENOUS

## 2017-11-04 MED ORDER — SODIUM CHLORIDE 0.9 % IV SOLN
Freq: Once | INTRAVENOUS | Status: AC
  Administered 2017-11-04: 10:00:00 via INTRAVENOUS

## 2017-11-04 MED ORDER — POTASSIUM PHOSPHATES 15 MMOLE/5ML IV SOLN
30.0000 mmol | Freq: Once | INTRAVENOUS | Status: AC
  Administered 2017-11-04: 30 mmol via INTRAVENOUS
  Filled 2017-11-04: qty 10

## 2017-11-04 NOTE — Progress Notes (Signed)
NP came to assess patient in room around 3am during rounds. Np saw patient bleeding from RLE. NP was aware of patient's labs, hgb 8.9, down from 11.9. Nurse was concerned about bleeding and questioned NP about getting blood transfusion. No new order at this time.  VSS, will continue to monitor.

## 2017-11-04 NOTE — Consult Note (Addendum)
Consultation Note Date: 11/04/2017   Patient Name: Joshua [name]  DOB: 06-14-1991  MRN: [id number]  Age / Sex: 27 y.o., male  PCP: Patient, No Pcp Per Referring Physician: Gladstone Lighter, MD  Reason for Consultation: Establishing goals of care  HPI/Patient Profile:  Joshua [name]  is a 27 y.o. male with a known history of asthma. Per EMR notes, he was found unresponsive in hotel room with a white powder crushed up on a table that he had apparently been snorting through a rolled up dollar bill. EMS report that he was breathing on scene and had pinpoint pupils. They gave Narcan but he was still unresponsive and they transported to the ED. Just prior to arrival in the ED it was reported that he stopped breathing. On arrival he was unresponsive with no pulse, CPR and intubated. 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 CRRT    Clinical Assessment and Goals of Care: Patient resting in bed on ventilator.  Family meeting with patient's wife and mother today.  They state that he has been struggling with alcohol and drugs for a while.  He has been in rehab several times, and his wife states that they have been separated (but still married)  due to his drug and alcohol use; his wife tells me she still loves him.  She states that he has had issues with depression in the has had several jobs which he has been fired from.   We discussed goals of care.  His mother states that she just cannot let her son go.  She states that she could never make that decision.  His wife states that she does not want him to continue to suffer.  She states that she wants to give him a bit longer to see if he shows improvement.  She states that she would not want chest compressions or shocks if his heart were to stop, she states he has suffered enough. She would never want a surgically created feeding tube placed.  They  are worried about his happiness with the quality of life that he could have upon discharge from this hospitalization.  They understand the tenuous situation that he is in and that he has a poor prognosis.  Following our conversation, the patient's sister arrived, and goals of care were discussed with her as part of the family.  At that time she called her husband and placed him on speaker phone and goals of care were discussed further.      SUMMARY OF RECOMMENDATIONS    Continue current level of care.  His wife states she does not want for him to suffer and would not want chest compressions or shocks if his heart were to stop.   Symptom Management:   Per primary team.  Palliative Prophylaxis:   Oral Care   Psycho-social/Spiritual:   Desire for further Chaplaincy support: chaplain is following.   Prognosis:   Poor. multiporgan failure.   Discharge Planning: To Be Determined  Primary Diagnoses: Present on Admission: **None**   I have reviewed the medical record, interviewed the patient and family, and examined the patient. The following aspects are pertinent.  Past Medical History:  Diagnosis Date  . Asthma   . Bronchitis   . Hernia cerebri [facility])    Social History   Socioeconomic History  . Marital status: Married    Spouse name: None  . Number of children: None  . Years of education: None  . Highest education level: None  Social Needs  . Financial resource strain: None  . Food insecurity - worry: None  . Food insecurity - inability: None  . Transportation needs - medical: None  . Transportation needs - non-medical: None  Occupational History  . None  Tobacco Use  . Smoking status: Current Every Day Smoker    Packs/day: 0.50    Types: Cigarettes  . Smokeless tobacco: Never Used  Substance and Sexual Activity  . Alcohol use: No  . Drug use: No  . Sexual activity: None  Other Topics Concern  . None  Social History Narrative  . None    History reviewed. No pertinent family history. Scheduled Meds: . chlorhexidine gluconate (MEDLINE KIT)  15 mL Mouth Rinse BID  . feeding supplement (NEPRO CARB STEADY)  1,000 mL Per Tube Q24H  . feeding supplement (PRO-STAT SUGAR FREE 64)  60 mL Per Tube TID  . hydrocortisone sod succinate (SOLU-CORTEF) inj  100 mg Intravenous Q8H  . mouth rinse  15 mL Mouth Rinse 10 times per day  . pantoprazole (PROTONIX) IV  40 mg Intravenous QHS  . sodium chloride flush  10-40 mL Intracatheter Q12H   Continuous Infusions: . ampicillin-sulbactam (UNASYN) IV Stopped (11/04/17 1230)  . anticoagulant sodium citrate    . dextrose 20 mL/hr at 11/04/17 0600  . fentaNYL infusion INTRAVENOUS 200 mcg/hr (11/04/17 1148)  . norepinephrine (LEVOPHED) Adult infusion 1 mcg/min (11/04/17 1332)  . propofol (DIPRIVAN) infusion 35 mcg/kg/min (11/04/17 1408)  . pureflow 2,000 mL/hr at 11/04/17 0716  . vasopressin (PITRESSIN) infusion - *FOR SHOCK* 0.04 Units/min (11/04/17 0658)   PRN Meds:.anticoagulant sodium citrate, bisacodyl, dextrose, docusate sodium, fentaNYL, midazolam, sennosides, sodium chloride flush Medications Prior to Admission:  Prior to Admission medications   Medication Sig Start Date End Date Taking? Authorizing Provider  albuterol (PROVENTIL HFA;VENTOLIN HFA) 108 (90 BASE) MCG/ACT inhaler Inhale 2 puffs into the lungs every 6 (six) hours as needed for wheezing or shortness of breath. Patient not taking: Reported on 10/30/2017 05/21/15   Arlyss Repress, PA-C  amoxicillin (AMOXIL) 875 MG tablet Take 1 tablet (875 mg total) by mouth 2 (two) times daily. Patient not taking: Reported on 10/30/2017 10/19/17   Cuthriell, Charline Bills, PA-C  ciprofloxacin (CIPRO) 500 MG tablet Take 1 tablet (500 mg total) by mouth 2 (two) times daily. Patient not taking: Reported on 10/30/2017 09/29/16   Delman Kitten, MD  dicyclomine (BENTYL) 20 MG tablet Take 1 tablet (20 mg total) by mouth 3 (three) times daily as needed  (abdominal pain). Patient not taking: Reported on 10/30/2017 05/28/16   Nance Pear, MD  gabapentin (NEURONTIN) 300 MG capsule Take 1 capsule (300 mg total) by mouth 3 (three) times daily. Patient not taking: Reported on 10/30/2017 02/28/16   Wallene Huh, DPM  HYDROcodone-acetaminophen (NORCO/VICODIN) 5-325 MG tablet Take 1 tablet by mouth every 6 (six) hours as needed for moderate pain. Patient not taking: Reported on 10/30/2017 09/29/16   Delman Kitten, MD  ibuprofen (ADVIL,MOTRIN) 600 MG tablet  Take 1 tablet (600 mg total) by mouth every 8 (eight) hours as needed. Patient not taking: Reported on 10/30/2017 11/14/16   Sable Feil, PA-C  ketorolac (TORADOL) 10 MG tablet Take 1 tablet (10 mg total) by mouth every 6 (six) hours as needed. Patient not taking: Reported on 10/30/2017 03/15/17   Gregor Hams, MD  magic mouthwash w/lidocaine SOLN Take 5 mLs by mouth 4 (four) times daily. Patient not taking: Reported on 10/30/2017 10/19/17   Cuthriell, Charline Bills, PA-C  metroNIDAZOLE (FLAGYL) 500 MG tablet Take 1 tablet (500 mg total) by mouth 3 (three) times daily. Patient not taking: Reported on 10/30/2017 09/29/16   Delman Kitten, MD  naproxen (NAPROSYN) 500 MG tablet Take 1 tablet (500 mg total) by mouth 2 (two) times daily with a meal. Patient not taking: Reported on 10/30/2017 10/03/17   Sable Feil, PA-C  neomycin-polymyxin-pramoxine (NEOSPORIN PLUS) 1 % cream Apply topically 2 (two) times daily. Patient not taking: Reported on 10/30/2017 11/14/16   Sable Feil, PA-C  ondansetron (ZOFRAN ODT) 4 MG disintegrating tablet Take 1 tablet (4 mg total) by mouth every 6 (six) hours as needed for nausea or vomiting. Patient not taking: Reported on 10/30/2017 09/29/16   Delman Kitten, MD  oxyCODONE-acetaminophen (ROXICET) 5-325 MG tablet Take 1 tablet by mouth every 6 (six) hours as needed for moderate pain. Patient not taking: Reported on 10/30/2017 11/14/16   Sable Feil, PA-C  sulfamethoxazole-trimethoprim (BACTRIM  DS,SEPTRA DS) 800-160 MG tablet Take 1 tablet by mouth 2 (two) times daily. Patient not taking: Reported on 10/30/2017 11/14/16   Sable Feil, PA-C   Allergies  Allergen Reactions  . Heparin     Patient being ruled out for HIT   Review of Systems  Unable to perform ROS   Physical Exam  Constitutional: No distress.  Pulmonary/Chest:  Ventilator support  Neurological:  Sedated  Skin: Skin is warm and dry.    Vital Signs: BP (!) 95/56   Pulse 69   Temp 97.9 F (36.6 C)   Resp 18   Ht '5\' 10"'  (1.778 m)   Wt 81.1 kg (178 lb 12.7 oz)   SpO2 100%   BMI 25.65 kg/m  Pain Assessment: CPOT       SpO2: SpO2: 100 % O2 Device:SpO2: 100 % O2 Flow Rate: .   IO: Intake/output summary:   Intake/Output Summary (Last 24 hours) at 11/04/2017 1521 Last data filed at 11/04/2017 1512 Gross per 24 hour  Intake 3718.17 ml  Output 1452 ml  Net 2266.17 ml    LBM: Last BM Date: 11/04/17 Baseline Weight: Weight: 74.8 kg (165 lb) Most recent weight: Weight: 81.1 kg (178 lb 12.7 oz)     Palliative Assessment/Data: Intubated     Time In: 1:30 Time Out: 3:30 Time Total: 2 hours Greater than 50%  of this time was spent counseling and coordinating care related to the above assessment and plan.  Signed by: Asencion Gowda, NP   Please contact Palliative Medicine Team phone at [phone] for questions and concerns.  For individual provider: See Shea Evans

## 2017-11-04 NOTE — Progress Notes (Signed)
[facility] Progess Note    SYNOPSIS   27 year old gentleman ith a past medical history of polysubstance abuse, status post prolonged cardiac arrest, respiratory failure, renal failure, rhabdomyolysis, shock  ASSESSMENT/PLAN   Status post cardiac arrest.   Patient's status has deteriorated. Required disarticulation/amputation secondary to persistent rhabdomyolysis and hypotension. Patient has had persistent bleeding at the wound. Also additional episode of bradycardia and hypotension, required atropine and restarting pressors. Family has been made aware.  Respiratory failure. Continue mechanical ventilation secondary to deteriorating clinical status   Septic shock. Requiring reinstitution of norepinephrine, patient is on vasopressin, hemoglobin has decreased to 7, additional packed red blood cell. Will check fibrinogen. INR and platelet counts have been stable  Polysubstance abuse. Known history of drug abuse. Per friends and family he snorts his drugs and are not taken IV area urine drug screen was positive for cocaine and opiates  Renal failure.  Will hold CRRT secondary to hypotension  Rhabdomyolysis. Status post amputation and disarticulation. CK has decreased to 29,343  Leukocytosis. Elevated white count on broad-spectrum antibiotic coverage.  Patient is presently on Unasyn and vancomycin  Thrombocytopenia. Has decreased more than 50% from admission. Although this is most likely secondary to marrow suppression from sepsis. Will convert off of heparin products and send off hit panel   Critical care time 40 minutes  VENTILATOR SETTINGS: Vent Mode: PRVC FiO2 (%):  [30 %] 30 % Set Rate:  [18 bmp] 18 bmp Vt Set:  [500 mL] 500 mL PEEP:  [5 cmH20] 5 cmH20 Plateau Pressure:  [17 cmH20-22 cmH20] 17 cmH20  HEMODYNAMICS: CVP:  [9 mmHg-19 mmHg] 10 mmHg  INTAKE / OUTPUT:  Intake/Output Summary (Last 24 hours) at 11/04/2017 0841 Last data filed at  11/04/2017 0700 Gross per 24 hour  Intake 1910 ml  Output 2030 ml  Net -120 ml    Name: Joshua [name] MRN: [id number] DOB: Apr 11, 1991    ADMISSION DATE:  10/30/2017  SUBJECTIVE:   Status post disarticulation secondary to persistent rhabdomyolysis and hypotension. Patient has had difficulty evening with recurrent bleeding. Received platelets prior to procedure, INR has been checked along with CBC pending additional unit of packed red blood cells. This morning patient's pulse became significantly bradycardic and hypotensive. Given atropine and restarted back on norepinephrine. CRRT held secondary to hypotension. Discussed with mother.  VITAL SIGNS: Temp:  [96.4 F (35.8 C)-99.9 F (37.7 C)] 97.2 F (36.2 C) (03/13 0800) Pulse Rate:  [57-85] 73 (03/13 0800) Resp:  [14-19] 18 (03/13 0800) BP: (91-127)/(50-81) 93/54 (03/13 0800) SpO2:  [90 %-100 %] 100 % (03/13 0800) FiO2 (%):  [30 %] 30 % (03/13 0333) Weight:  [81.1 kg (178 lb 12.7 oz)] 81.1 kg (178 lb 12.7 oz) (03/13 0500)  PHYSICAL EXAMINATION: Physical Examination:   VS: BP (!) 93/54   Pulse 73   Temp (!) 97.2 F (36.2 C)   Resp 18   Ht 5\' 10"  (1.778 m)   Wt 81.1 kg (178 lb 12.7 oz)   SpO2 100%   BMI 25.65 kg/m   General Appearance: critically ill unresponsive in the intensive care unit Neuro:patient does startle when you call his name Pulmonary: coarse rhonchi appreciated CardiovascularNormal S1,S2.  No m/r/g.   Abdomen: soft exam, positive bowel sounds Extremities: Status post disarticulation/amputation with soaking through of his wound and active bleeding.  LABORATORY PANEL:   CBC Recent Labs  Lab 11/04/17 0738  WBC 18.5*  HGB 7.0*  HCT 21.7*  PLT 140*  Chemistries  Recent Labs  Lab 11/03/17 2051 11/04/17 0102  11/04/17 0738  NA 139  --    < > 137  K 3.9  --    < > 4.3  CL 104  --    < > 104  CO2 25  --    < > 26  GLUCOSE 138*  --    < > 149*  BUN 30*  --    < > 32*  CREATININE 2.27*  --    < >  2.40*  CALCIUM 7.8*  --    < > 7.3*  MG 1.8  --   --   --   PHOS 3.2  --    < > 3.7  AST  --  1,253*  --   --   ALT  --  485*  --   --   ALKPHOS  --  73  --   --   BILITOT  --  0.7  --   --    < > = values in this interval not displayed.    Recent Labs  Lab 11/03/17 1130 11/03/17 1848 11/03/17 2019 11/04/17 0006 11/04/17 0409 11/04/17 0745  GLUCAP 120* 124* 109* 98 99 130*   Recent Labs  Lab 10/30/17 2300 10/31/17 1747 11/01/17 0023  PHART 7.22* 7.40 7.48*  PCO2ART 56* 48 35  PO2ART 75* 69* 55*   Recent Labs  Lab 10/31/17 0811  11/01/17 0549  11/04/17 0102 11/04/17 0250 11/04/17 0738  AST 1,662*  --  2,233*  --  1,253*  --   --   ALT 659*  --  756*  --  485*  --   --   ALKPHOS 39  --  46  --  73  --   --   BILITOT 1.4*  --  1.2  --  0.7  --   --   ALBUMIN 2.6*   < > 2.5*   < > 2.5* 2.4* 2.3*   < > = values in this interval not displayed.    Cardiac Enzymes Recent Labs  Lab 11/02/17 0527  TROPONINI 2.43*    RADIOLOGY:  Dg Chest Port 1 View  Result Date: 11/03/2017 CLINICAL DATA:  Intubation. EXAM: PORTABLE CHEST 1 VIEW COMPARISON:  11/02/2017. FINDINGS: Endotracheal tube, right IJ line, left IJ line, NG tube in stable position. Heart size stable. Interim clearing of left pleural effusion. Persistent right pleural effusion. Persistent mild right base atelectasis/infiltrate. No pneumothorax. Heart size stable. IMPRESSION: 1.  Lines and tubes in stable position. 2. Interim clearing of left pleural effusion. Right pleural effusion and mild right base atelectasis/infiltrate again noted without interim change. Electronically Signed   By: Maisie Fushomas  Register   On: 11/03/2017 10:47    Tora KindredJohn Tammala Weider, DO  11/04/2017

## 2017-11-04 NOTE — Progress Notes (Signed)
Telemetry reviewed which shows artifact in the setting of tremor.

## 2017-11-04 NOTE — Progress Notes (Signed)
[facility]  Daily Progress Note   Subjective  - 1 Day Post-Op  Remains intubated and sedated.  He has been more hemodynamically unstable over the past 24 hours.  Dressing has been saturated and reinforced several times.  Objective Vitals:   11/04/17 1530 11/04/17 1531 11/04/17 1600 11/04/17 1630  BP: (!) 95/54  (!) 93/56   Pulse: 69  69 66  Resp: 18  18 18   Temp: 97.9 F (36.6 C)  97.9 F (36.6 C) 97.9 F (36.6 C)  TempSrc:   Core   SpO2: 100% 100% 100% 100%  Weight:      Height:        Intake/Output Summary (Last 24 hours) at 11/04/2017 1648 Last data filed at 11/04/2017 1512 Gross per 24 hour  Intake 3698.17 ml  Output 1452 ml  Net 2246.17 ml    PULM  Normal effort , no use of accessory muscles CV  No JVD, RRR Abd      No distended, nontender VASC  dressing is removed and the open wound is inspected.  No significant bleeding noted.  Laboratory CBC    Component Value Date/Time   WBC 14.8 (H) 11/04/2017 1620   HGB 8.4 (L) 11/04/2017 1620   HGB 16.2 05/08/2014 2307   HCT 25.2 (L) 11/04/2017 1620   HCT 48.0 05/08/2014 2307   PLT 122 (L) 11/04/2017 1620   PLT 297 05/08/2014 2307    BMET    Component Value Date/Time   NA 137 11/04/2017 0738   NA 141 05/08/2014 2307   K 4.3 11/04/2017 0738   K 4.0 05/08/2014 2307   CL 104 11/04/2017 0738   CL 103 05/08/2014 2307   CO2 26 11/04/2017 0738   CO2 31 05/08/2014 2307   GLUCOSE 149 (H) 11/04/2017 0738   GLUCOSE 95 05/08/2014 2307   BUN 32 (H) 11/04/2017 0738   BUN 19 (H) 05/08/2014 2307   CREATININE 2.40 (H) 11/04/2017 0738   CREATININE 1.02 05/08/2014 2307   CALCIUM 7.3 (L) 11/04/2017 0738   CALCIUM 9.6 05/08/2014 2307   GFRNONAA 35 (L) 11/04/2017 0738   GFRNONAA >60 05/08/2014 2307   GFRAA 41 (L) 11/04/2017 0738   GFRAA >60 05/08/2014 2307    Assessment/Planning:   Postoperative day 1 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  Patient's dressing was changed by myself.   Surgicel and thrombin was utilized.  Gauze padding was applied followed by a Ace wrap.  Continue supportive care at this point.    [name] Wlliam Grosso  11/04/2017, 4:48 PM

## 2017-11-04 NOTE — Progress Notes (Signed)
Around 1200 am assessment on patient, nurse noticed bleeding around right LE where surgery was done. Dressing was reinforced but patient continues to bleed. NP was notified and labs were drawn on patient. Dr. Wyn Quakerew was paged and contacted regarding patient situation and new lab results. No new order was given. Will continue to monitor and assess patient.

## 2017-11-04 NOTE — Progress Notes (Signed)
[facility]  ROUNDING NOTE   Subjective:   Right disarticulation of lower extremity by Dr. Delana Meyer yesterday 3/12.   CRRT Wbc 18.1 (21.2) plts 156 status post transfusion  PRBC transfusion today  Norepinephrine. Bradycardia this morning and given atropine and epinephrine.   Objective:  Vital signs in last 24 hours:  Temp:  [96.4 F (35.8 C)-99.9 F (37.7 C)] 97.2 F (36.2 C) (03/13 1130) Pulse Rate:  [57-90] 68 (03/13 1130) Resp:  [14-19] 18 (03/13 1130) BP: (89-169)/(50-111) 95/58 (03/13 1130) SpO2:  [90 %-100 %] 100 % (03/13 1130) FiO2 (%):  [30 %] 30 % (03/13 1125) Weight:  [81.1 kg (178 lb 12.7 oz)] 81.1 kg (178 lb 12.7 oz) (03/13 0500)  Weight change: -1.2 kg (-10.3 oz) Filed Weights   11/02/17 0500 11/03/17 0500 11/04/17 0500  Weight: 80 kg (176 lb 5.9 oz) 82.3 kg (181 lb 7 oz) 81.1 kg (178 lb 12.7 oz)    Intake/Output: I/O last 3 completed shifts: In: 3009.8 [I.V.:2252.8; Blood:367; NG/GT:90; IV Piggyback:300] Out: 3580 [Urine:25; Other:3105; Stool:375; Blood:75]   Intake/Output this shift:  Total I/O In: 1403.9 [I.V.:223.9; Blood:340; NG/GT:30; IV Piggyback:810] Out: -   Physical Exam: General: Critically ill  Head: ETT  Eyes:  PERRL  Neck: Right dialysis catheter, Left TLC  Lungs:  PRVC 40%  Heart: Regular rate and rhythm  Abdomen:  Soft, nontender,   Extremities: Right amputation, left with cyanosis, 1+ dorsalis pedis pulse  Neurologic: Intubated, sedated  Skin: Mottled lower extremities  Access: RIJ temp HD catheter 3/8 Dr. Jefferson Fuel    Basic Metabolic Panel: Recent Labs  Lab 11/02/17 0953  11/02/17 2124  11/03/17 1034 11/03/17 1842 11/03/17 2051 11/04/17 0250 11/04/17 0738  NA 137   < > 139   < > 138 139 139 138 137  K 3.2*   < > 3.9   < > 4.1 4.0 3.9 3.8 4.3  CL 102   < > 103   < > 104 105 104 106 104  CO2 25   < > 26   < > '26 26 25 27 26  ' GLUCOSE 143*   < > 143*   < > 139* 136* 138* 114* 149*  BUN 27*   < > 26*   < >  30* 29* 30* 31* 32*  CREATININE 2.51*   < > 2.37*   < > 2.32* 2.23* 2.27* 2.28* 2.40*  CALCIUM 6.9*   < > 7.6*   < > 7.7* 7.5* 7.8* 7.5* 7.3*  MG 1.8  --  2.0  --  2.0  --  1.8  --  1.8  PHOS 4.2   < > 3.8   < > 3.3 3.3 3.2 2.1* 3.7   < > = values in this interval not displayed.    Liver Function Tests: Recent Labs  Lab 10/30/17 1204 10/30/17 2132 10/31/17 0811  11/01/17 0549  11/03/17 1842 11/03/17 2051 11/04/17 0102 11/04/17 0250 11/04/17 0738  AST 316* 1,027* 1,662*  --  2,233*  --   --   --  1,253*  --   --   ALT 233* 474* 659*  --  756*  --   --   --  485*  --   --   ALKPHOS 84 44 39  --  46  --   --   --  73  --   --   BILITOT 1.4* 1.8* 1.4*  --  1.2  --   --   --  0.7  --   --   PROT 8.1 3.8* 4.9*  --  4.8*  --   --   --  5.5*  --   --   ALBUMIN 4.9 2.2* 2.6*   < > 2.5*   < > 2.6* 2.6* 2.5* 2.4* 2.3*   < > = values in this interval not displayed.   No results for input(s): LIPASE, AMYLASE in the last 168 hours. Recent Labs  Lab 10/30/17 2132  AMMONIA 32    CBC: Recent Labs  Lab 10/30/17 1204 10/30/17 1858  11/02/17 1520 11/03/17 0411 11/04/17 0102 11/04/17 0738 11/04/17 1212  WBC 49.5* 39.5*   < > 23.5* 21.2* 18.1* 18.5* 19.4*  NEUTROABS 39.0* 37.0*  --   --   --   --   --   --   HGB 17.1 16.3   < > 11.9* 11.2* 8.9* 7.0* 7.5*  HCT 55.7* 50.9   < > 35.8* 33.1* 26.9* 21.7* 22.6*  MCV 100.3* 95.4   < > 93.6 92.7 94.1 94.7 92.4  PLT 282 214   < > 66* 66* 156 140* 132*   < > = values in this interval not displayed.    Cardiac Enzymes: Recent Labs  Lab 10/30/17 2132 10/31/17 0216  10/31/17 0865  11/01/17 0549  11/02/17 0527 11/02/17 1520 11/03/17 0411 11/03/17 1842 11/04/17 0628 11/04/17 0738  CKTOTAL >50,000* >50,000*   < >  --    < > >50,000*   < > >50,000* >50,000* >50,000* >50,000* 29,343* 30,625*  TROPONINI 3.31* 6.44*  --  6.06*  --  7.17*  --  2.43*  --   --   --   --   --    < > = values in this interval not displayed.    BNP: Invalid  input(s): POCBNP  CBG: Recent Labs  Lab 11/03/17 2019 11/04/17 0006 11/04/17 0409 11/04/17 0745 11/04/17 1217  GLUCAP 109* 98 99 130* 104*    Microbiology: Results for orders placed or performed during the hospital encounter of 10/30/17  MRSA PCR Screening     Status: None   Collection Time: 10/30/17  7:54 PM  Result Value Ref Range Status   MRSA by PCR NEGATIVE NEGATIVE Final    Comment:        The GeneXpert MRSA Assay (FDA approved for NASAL specimens only), is one component of a comprehensive MRSA colonization surveillance program. It is not intended to diagnose MRSA infection nor to guide or monitor treatment for MRSA infections. Performed at [facility], [address] [place], [place] [address]     Coagulation Studies: Recent Labs    11/02/17 1520 11/03/17 0411 11/04/17 0102  LABPROT 14.5 14.0 14.3  INR 1.14 1.09 1.12    Urinalysis: No results for input(s): COLORURINE, LABSPEC, PHURINE, GLUCOSEU, HGBUR, BILIRUBINUR, KETONESUR, PROTEINUR, UROBILINOGEN, NITRITE, LEUKOCYTESUR in the last 72 hours.  Invalid input(s): APPERANCEUR    Imaging: Dg Chest Port 1 View  Result Date: 11/04/2017 CLINICAL DATA:  27 year old male found down. Suspected drug overdose. V fib arrest status post CPR and resuscitation. Acute renal failure, rhabdomyolysis, right lower extremity rhabdomyolysis and ischemia requiring amputation yesterday. EXAM: PORTABLE CHEST 1 VIEW COMPARISON:  11/03/2017 and earlier. FINDINGS: Portable AP supine view at 0930 hours. Endotracheal tube tip is stable just below the clavicles. Enteric tube courses to the abdomen, side hole up the level of the stomach. Stable dual lumen right IJ vascular catheter. Stable single lumen left IJ central line. Pacer pad  projects over the lower chest. Mediastinal contours remain normal. Bilateral veiling pulmonary opacity persists, greater on the right. No superimposed pneumothorax. Pulmonary vascularity is within  normal limits. No acute osseous abnormality identified. Negative visible bowel gas pattern. IMPRESSION: 1.  Stable lines and tubes. 2. Stable ventilation. Right greater than left pleural effusions with lower lobe atelectasis. Electronically Signed   By: Genevie Ann M.D.   On: 11/04/2017 09:59   Dg Chest Port 1 View  Result Date: 11/03/2017 CLINICAL DATA:  Intubation. EXAM: PORTABLE CHEST 1 VIEW COMPARISON:  11/02/2017. FINDINGS: Endotracheal tube, right IJ line, left IJ line, NG tube in stable position. Heart size stable. Interim clearing of left pleural effusion. Persistent right pleural effusion. Persistent mild right base atelectasis/infiltrate. No pneumothorax. Heart size stable. IMPRESSION: 1.  Lines and tubes in stable position. 2. Interim clearing of left pleural effusion. Right pleural effusion and mild right base atelectasis/infiltrate again noted without interim change. Electronically Signed   By: Marcello Moores  Register   On: 11/03/2017 10:47     Medications:   . ampicillin-sulbactam (UNASYN) IV 3 g (11/04/17 1157)  . anticoagulant sodium citrate    . dextrose 20 mL/hr at 11/04/17 0600  . fentaNYL infusion INTRAVENOUS 200 mcg/hr (11/04/17 1148)  . norepinephrine (LEVOPHED) Adult infusion 2 mcg/min (11/04/17 1148)  . propofol (DIPRIVAN) infusion 30 mcg/kg/min (11/04/17 0953)  . pureflow 2,000 mL/hr at 11/04/17 0716  . vasopressin (PITRESSIN) infusion - *FOR SHOCK* 0.04 Units/min (11/04/17 0658)   . chlorhexidine gluconate (MEDLINE KIT)  15 mL Mouth Rinse BID  . feeding supplement (NEPRO CARB STEADY)  1,000 mL Per Tube Q24H  . feeding supplement (PRO-STAT SUGAR FREE 64)  60 mL Per Tube TID  . hydrocortisone sod succinate (SOLU-CORTEF) inj  100 mg Intravenous Q8H  . mouth rinse  15 mL Mouth Rinse 10 times per day  . pantoprazole (PROTONIX) IV  40 mg Intravenous QHS  . sodium chloride flush  10-40 mL Intracatheter Q12H   anticoagulant sodium citrate, bisacodyl, dextrose, docusate sodium,  fentaNYL, midazolam, sennosides, sodium chloride flush  Assessment/ Plan:  [name] is a 27 y.o. white male with asthma, bronchitis, polysubstance abuse, was admitted on3/8/2019after he was found down in a hotel for an unknown period of time in the fetal position. Hospital course is complicated by prolonged CPR for 45 minutes in the emergency room. Ventricular fibrillation arrest, hypothermia protocol.  Urine tox screen positive for cocaine and opiates.  Hospital course complicated by acute respiratory failure and acute renal failure  1.  Acute renal failure: anuric. Requiring renal replacement therapy. Continuing to get renal injury from hypotension and rhabdomyolysis.  - Continue CRRT.  - Holding ultrafiltation  2.  Rhabdomyolysis: cocaine induced rhabdomyolysis. CPK trending down to 30,625 this morning.   3.  Acute respiratory failure requiring ventilator support  4.  Shock- elevated LFTs, Troponin. Cardiogenic and septic. WBC trending down.  - Empiric unasyn and vancomycin.  - requiring vasopressors.   5. Right lower extremity ischemia: status post disarticulation by Dr. Delana Meyer on 3/12  6. DIC: status post platelet transfusion. HIT panel pending.   Plan: Continues to require renal replacement therapy. Continues to be anuric.  Continue CRRT UF 100 cc/hr        LOS: 5 Ariana Juul 3/13/201912:42 PM

## 2017-11-04 NOTE — Progress Notes (Signed)
Patient is continuing to bleed from RLE site with serosanguineous drainage. At 06:42 am patient had whole body tremor with HR tachy in 160's. 2 MG of versed was given and fentanyl was titrated to 200 mcg/hr. Patient's tremor stopped with administration of versed.

## 2017-11-04 NOTE — Progress Notes (Signed)
[facility] [place] [facility]   PATIENT NAME: Joshua [name]    MR#:  [id number]  DATE OF BIRTH:  Feb 02, 1991  SUBJECTIVE:  CHIEF COMPLAINT:   Chief Complaint  Patient presents with  . 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 Heparin     Patient being ruled out for HIT    VITALS:  Blood pressure (!) 93/54, pulse 73, temperature (!) 97.2 F (36.2 C), resp. rate 18, height 5\' 10"  (1.778 m), weight 81.1 kg (178 lb 12.7 oz), SpO2 100 %.  PHYSICAL EXAMINATION:  Physical Exam  GENERAL:  27 y.o.-year-old patient lying in the bed, critically ill appearing EYES: Pupils equal, round, dilated, reactive to light and accommodation. No scleral icterus. Extraocular muscles intact. Swollen eyelids noted HEENT: Head atraumatic, normocephalic. Oropharynx and nasopharynx clear.  NECK:  Supple, no jugular venous distention. No thyroid enlargement, no tenderness.  LUNGS: Normal breath sounds bilaterally, no wheezing, rales,rhonchi or crepitation. No use of accessory muscles of respiration.  CARDIOVASCULAR: S1, S2 normal. No murmurs, rubs, or gallops.  ABDOMEN: Soft, nontender, nondistended. Bowel sounds present. No organomegaly or mass.  EXTREMITIES:  Right leg disarticulated at the knee- dressing saturated with blood. NEUROLOGIC: Sedated.  PSYCHIATRIC: The patient is sedated.  SKIN: No obvious rash, lesion, or ulcer.    LABORATORY PANEL:   CBC Recent Labs  Lab 11/04/17 0738  WBC 18.5*  HGB 7.0*  HCT 21.7*  PLT 140*   ------------------------------------------------------------------------------------------------------------------  Chemistries  Recent Labs  Lab 11/03/17 2051 11/04/17 0102  11/04/17 0738  NA 139  --    < > 137  K 3.9  --    < > 4.3  CL 104  --     < > 104  CO2 25  --    < > 26  GLUCOSE 138*  --    < > 149*  BUN 30*  --    < > 32*  CREATININE 2.27*  --    < > 2.40*  CALCIUM 7.8*  --    < > 7.3*  MG 1.8  --   --   --   AST  --  1,253*  --   --   ALT  --  485*  --   --   ALKPHOS  --  73  --   --   BILITOT  --  0.7  --   --    < > = values in this interval not displayed.   ------------------------------------------------------------------------------------------------------------------  Cardiac Enzymes Recent Labs  Lab 11/02/17 0527  TROPONINI 2.43*   ------------------------------------------------------------------------------------------------------------------  RADIOLOGY:  Dg Chest Port 1 View  Result Date: 11/03/2017 CLINICAL DATA:  Intubation. EXAM: PORTABLE CHEST 1 VIEW COMPARISON:  11/02/2017. FINDINGS: Endotracheal tube, right IJ line, left IJ line, NG tube in stable position. Heart size stable. Interim clearing of left pleural effusion. Persistent right pleural effusion. Persistent mild right base atelectasis/infiltrate. No pneumothorax. Heart size stable. IMPRESSION: 1.  Lines and tubes in stable position. 2. Interim clearing of left pleural effusion. Right pleural effusion and mild right base atelectasis/infiltrate again noted without interim change. Electronically Signed   By: Maisie Fus  Register   On: 11/03/2017 10:47    EKG:   Orders placed or performed during the hospital encounter  of 10/30/17  . EKG 12-Lead  . EKG 12-Lead  . EKG 12-Lead  . EKG 12-Lead  . EKG 12-Lead  . EKG 12-Lead  . EKG 12-Lead  . EKG 12-Lead    ASSESSMENT AND PLAN:   27 year old male with known history of asthma, tobacco abuse presents to hospital secondary to an unresponsive state.  1. Acute cardio-respiratory failure-concern for drug overdose. - cardiac arrhythmia from electrolyte abnormalities likely  - decreased EF from stunned myocardium- repeat ECHO again after recovery -Intubated, on ventilator. -On sedation  -Appreciate  pulmonary intensive care consult and neurology consult - remains critically ill with DIC now  2. Acute renal failure and hyperkalemia-secondary to rhabdomyolysis. -Appreciate nephrology consult.  -on CRRT, remains oliguric  3. Rhabdomyolysis-possible ischemic right leg with compartment syndrome. -Appreciate vascular consult.  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 Sepsis-significantly elevated pro calcitonin, WBC and lactic acid -Continue broad-spectrum antibiotics with  Unasyn at this time -vancomycin has been discontinued - on stress dose steroids  5. DIC- low plts, active bleeding, elevated fibrin dear with tenderness. -Status post platelet transfusion yesterday. Hemoglobin is low, blood transfusion today  6. Polysubstance abuse-will be addressed after extubation. Will need psych consult as well.  6. DVT prophylaxis - Ted's and SCDs  Patient is critically ill.     All the records are reviewed and case discussed with Care Management/Social Workerr. Management plans discussed with the patient, family and they are in agreement.  CODE STATUS: Full code  TOTAL TIME TAKING CARE OF THIS PATIENT: 32 minutes.   POSSIBLE D/C IN ? DAYS, DEPENDING ON CLINICAL CONDITION.   Enid BaasKALISETTI,Orpha Dain M.D on 11/04/2017 at 9:08 AM  Between 7am to 6pm - Pager - [phone]  After 6pm go to [email or web address] - password [email or web address]  [facility]  Office  [phone]  CC: Primary care physician; Patient, No Pcp Per

## 2017-11-04 NOTE — Progress Notes (Signed)
MEDICATION RELATED CONSULT NOTE - FOLLOW UP   Pharmacy Consult for Electrolyte management Indication: hypophosphatemia  Allergies  Allergen Reactions  . Heparin     Patient being ruled out for HIT    Patient Measurements: Height: '5\' 10"'  (177.8 cm) Weight: 181 lb 7 oz (82.3 kg) IBW/kg (Calculated) : 73   Vital Signs: Temp: 99 F (37.2 C) (03/13 0300) Temp Source: Core (03/12 2000) BP: 96/57 (03/13 0300) Pulse Rate: 71 (03/13 0300) Intake/Output from previous day: 03/12 0701 - 03/13 0700 In: 1785.4 [I.V.:1228.4; Blood:367; NG/GT:90; IV Piggyback:100] Out: 1729 [Urine:5; Stool:75; Blood:75] Intake/Output from this shift: Total I/O In: 782.4 [I.V.:415.4; Blood:367] Out: 769 [Other:769]  Labs: Recent Labs    11/01/17 0549  11/02/17 1520 11/02/17 2124  11/03/17 0411 11/03/17 1034 11/03/17 1842 11/03/17 2051 11/04/17 0102 11/04/17 0250  WBC 31.8*   < > 23.5*  --   --  21.2*  --   --   --  18.1*  --   HGB 13.6   < > 11.9*  --   --  11.2*  --   --   --  8.9*  --   HCT 40.8   < > 35.8*  --   --  33.1*  --   --   --  26.9*  --   PLT 106*   < > 66*  --   --  66*  --   --   --  156  --   APTT  --   --  40*  --   --   --   --   --   --   --   --   CREATININE 2.74*   < > 2.58* 2.37*   < >  --  2.32* 2.23* 2.27*  --  2.28*  MG 1.9   < >  --  2.0  --   --  2.0  --  1.8  --   --   PHOS 4.5   < > 4.6 3.8   < >  --  3.3 3.3 3.2  --  2.1*  ALBUMIN 2.5*   < > 2.6* 2.5*   < >  --  2.5* 2.6* 2.6* 2.5* 2.4*  PROT 4.8*  --   --   --   --   --   --   --   --  5.5*  --   AST 2,233*  --   --   --   --   --   --   --   --  1,253*  --   ALT 756*  --   --   --   --   --   --   --   --  485*  --   ALKPHOS 46  --   --   --   --   --   --   --   --  73  --   BILITOT 1.2  --   --   --   --   --   --   --   --  0.7  --   BILIDIR  --   --   --   --   --   --   --   --   --  0.1  --   IBILI  --   --   --   --   --   --   --   --   --  0.6  --    < > =  values in this interval not displayed.    Estimated Creatinine Clearance: 49.8 mL/min (A) (by C-G formula based on SCr of 2.28 mg/dL (H)).   Microbiology: Recent Results (from the past 720 hour(s))  MRSA PCR Screening     Status: None   Collection Time: 10/30/17  7:54 PM  Result Value Ref Range Status   MRSA by PCR NEGATIVE NEGATIVE Final    Comment:        The GeneXpert MRSA Assay (FDA approved for NASAL specimens only), is one component of a comprehensive MRSA colonization surveillance program. It is not intended to diagnose MRSA infection nor to guide or monitor treatment for MRSA infections. Performed at [facility], [address] [place], [place] [address]     Medications:  Scheduled:  . chlorhexidine gluconate (MEDLINE KIT)  15 mL Mouth Rinse BID  . feeding supplement (NEPRO CARB STEADY)  1,000 mL Per Tube Q24H  . feeding supplement (PRO-STAT SUGAR FREE 64)  60 mL Per Tube TID  . hydrocortisone sod succinate (SOLU-CORTEF) inj  100 mg Intravenous Q8H  . mouth rinse  15 mL Mouth Rinse 10 times per day  . multivitamin  15 mL Per Tube Daily  . pantoprazole sodium  40 mg Per Tube QHS  . sodium chloride flush  10-40 mL Intracatheter Q12H  . sodium chloride flush  3 mL Intravenous Q12H    Assessment: 27 yo male admitted with cardiac arrest and found to have ventricular fibrillation. Patient has known history of polysubstance abuse and his drug screen was positive for cocaine and opioids. Patient is critically ill and has acute renal failure. Patient is on hypothermia protocol and will be started on CRRT after dialysis catheter placement. Pharmacy has been consulted to monitor and replace electrolytes. Electrolyte replacement should be done cautiously as patient is critically ill and on hypothermia protocol.  K current 3.8 Phos 2.1  Goal of Therapy:  K > 4 Phos 2.5 - 4.5  Plan:  Will replace w/ Kphos 30 mmol IV x 1 over 6 hour and will recheck electrolytes @ 1000. This replacement contains 44 mEq  of K+ which will theoretically increase K+ to 4.2  Tobie Lords, PharmD, BCPS Clinical Pharmacist 11/04/2017

## 2017-11-04 NOTE — Progress Notes (Signed)
Pt hemodynamically unstable, no wean today

## 2017-11-05 ENCOUNTER — Other Ambulatory Visit: Payer: Self-pay

## 2017-11-05 LAB — RENAL FUNCTION PANEL
ALBUMIN: 2.3 g/dL — AB (ref 3.5–5.0)
ANION GAP: 9 (ref 5–15)
Albumin: 2.3 g/dL — ABNORMAL LOW (ref 3.5–5.0)
Albumin: 2.3 g/dL — ABNORMAL LOW (ref 3.5–5.0)
Anion gap: 8 (ref 5–15)
Anion gap: 9 (ref 5–15)
BUN: 27 mg/dL — AB (ref 6–20)
BUN: 26 mg/dL — AB (ref 6–20)
BUN: 28 mg/dL — ABNORMAL HIGH (ref 6–20)
CHLORIDE: 106 mmol/L (ref 101–111)
CHLORIDE: 103 mmol/L (ref 101–111)
CO2: 25 mmol/L (ref 22–32)
CO2: 25 mmol/L (ref 22–32)
CO2: 24 mmol/L (ref 22–32)
CREATININE: 2.28 mg/dL — AB (ref 0.61–1.24)
CREATININE: 2.25 mg/dL — AB (ref 0.61–1.24)
Calcium: 7.6 mg/dL — ABNORMAL LOW (ref 8.9–10.3)
Calcium: 7.6 mg/dL — ABNORMAL LOW (ref 8.9–10.3)
Calcium: 7.9 mg/dL — ABNORMAL LOW (ref 8.9–10.3)
Chloride: 106 mmol/L (ref 101–111)
Creatinine, Ser: 2.24 mg/dL — ABNORMAL HIGH (ref 0.61–1.24)
GFR calc Af Amer: 43 mL/min — ABNORMAL LOW (ref >60)
GFR calc Af Amer: 44 mL/min — ABNORMAL LOW (ref >60)
GFR calc non Af Amer: 38 mL/min — ABNORMAL LOW (ref >60)
GFR calc non Af Amer: 38 mL/min — ABNORMAL LOW (ref >60)
GFR, EST AFRICAN AMERICAN: 44 mL/min — AB (ref >60)
GFR, EST NON AFRICAN AMERICAN: 37 mL/min — AB (ref >60)
GLUCOSE: 92 mg/dL (ref 65–99)
Glucose, Bld: 96 mg/dL (ref 65–99)
Glucose, Bld: 118 mg/dL — ABNORMAL HIGH (ref 65–99)
PHOSPHORUS: 1.7 mg/dL — AB (ref 2.5–4.6)
POTASSIUM: 3.9 mmol/L (ref 3.5–5.1)
Phosphorus: 4.1 mg/dL (ref 2.5–4.6)
Phosphorus: 3.4 mg/dL (ref 2.5–4.6)
Potassium: 4.4 mmol/L (ref 3.5–5.1)
Potassium: 4.5 mmol/L (ref 3.5–5.1)
Sodium: 139 mmol/L (ref 135–145)
Sodium: 140 mmol/L (ref 135–145)
Sodium: 136 mmol/L (ref 135–145)

## 2017-11-05 LAB — CBC WITH DIFFERENTIAL/PLATELET
BAND NEUTROPHILS: 5 %
BLASTS: 0 %
Basophils Absolute: 0 10*3/uL (ref 0–0.1)
Basophils Relative: 0 %
EOS ABS: 0 10*3/uL (ref 0–0.7)
Eosinophils Relative: 0 %
HEMATOCRIT: 24.3 % — AB (ref 40.0–52.0)
HEMOGLOBIN: 8.1 g/dL — AB (ref 13.0–18.0)
LYMPHS PCT: 11 %
Lymphs Abs: 1.7 10*3/uL (ref 1.0–3.6)
MCH: 30.3 pg (ref 26.0–34.0)
MCHC: 33.4 g/dL (ref 32.0–36.0)
MCV: 90.9 fL (ref 80.0–100.0)
MONOS PCT: 8 %
Metamyelocytes Relative: 4 %
Monocytes Absolute: 1.3 10*3/uL — ABNORMAL HIGH (ref 0.2–1.0)
Myelocytes: 1 %
NEUTROS ABS: 12.9 10*3/uL — AB (ref 1.4–6.5)
NEUTROS PCT: 71 %
OTHER: 0 %
Platelets: 131 10*3/uL — ABNORMAL LOW (ref 150–440)
Promyelocytes Absolute: 0 %
RBC: 2.67 MIL/uL — AB (ref 4.40–5.90)
RDW: 13.4 % (ref 11.5–14.5)
WBC: 15.9 10*3/uL — AB (ref 3.8–10.6)
nRBC: 0 /100 WBC

## 2017-11-05 LAB — GLUCOSE, CAPILLARY
GLUCOSE-CAPILLARY: 103 mg/dL — AB (ref 65–99)
GLUCOSE-CAPILLARY: 124 mg/dL — AB (ref 65–99)
GLUCOSE-CAPILLARY: 129 mg/dL — AB (ref 65–99)
GLUCOSE-CAPILLARY: 74 mg/dL (ref 65–99)
GLUCOSE-CAPILLARY: 76 mg/dL (ref 65–99)
Glucose-Capillary: 97 mg/dL (ref 65–99)
Glucose-Capillary: 72 mg/dL (ref 65–99)

## 2017-11-05 LAB — MAGNESIUM
MAGNESIUM: 1.6 mg/dL — AB (ref 1.7–2.4)
Magnesium: 2 mg/dL (ref 1.7–2.4)

## 2017-11-05 LAB — CK
CK TOTAL: 21105 U/L — AB (ref 49–397)
Total CK: 16006 U/L — ABNORMAL HIGH (ref 49–397)

## 2017-11-05 MED ORDER — LORAZEPAM 2 MG/ML IJ SOLN
2.0000 mg | INTRAMUSCULAR | Status: DC | PRN
  Administered 2017-11-05: 2 mg via INTRAVENOUS
  Administered 2017-11-06: 1 mg via INTRAVENOUS

## 2017-11-05 MED ORDER — LORAZEPAM 2 MG/ML IJ SOLN
INTRAMUSCULAR | Status: AC
  Administered 2017-11-05: 2 mg via INTRAVENOUS
  Filled 2017-11-05: qty 1

## 2017-11-05 MED ORDER — POTASSIUM PHOSPHATES 15 MMOLE/5ML IV SOLN
45.0000 mmol | Freq: Once | INTRAVENOUS | Status: AC
  Administered 2017-11-05: 45 mmol via INTRAVENOUS
  Filled 2017-11-05: qty 15

## 2017-11-05 MED ORDER — MAGNESIUM SULFATE 2 GM/50ML IV SOLN
2.0000 g | Freq: Once | INTRAVENOUS | Status: AC
  Administered 2017-11-05: 2 g via INTRAVENOUS
  Filled 2017-11-05: qty 50

## 2017-11-05 MED ORDER — PRO-STAT SUGAR FREE PO LIQD
30.0000 mL | Freq: Three times a day (TID) | ORAL | Status: DC
  Administered 2017-11-05 (×2): 30 mL

## 2017-11-05 MED ORDER — VITAL HIGH PROTEIN PO LIQD
1000.0000 mL | ORAL | Status: DC
  Administered 2017-11-05: 1000 mL

## 2017-11-05 MED ORDER — HEPARIN SODIUM (PORCINE) 1000 UNIT/ML DIALYSIS
1000.0000 [IU] | INTRAMUSCULAR | Status: DC | PRN
  Administered 2017-11-05 – 2017-11-07 (×2): 1000 [IU] via INTRAVENOUS_CENTRAL
  Filled 2017-11-05 (×3): qty 6

## 2017-11-05 MED FILL — Medication: Qty: 1 | Status: AC

## 2017-11-05 NOTE — Progress Notes (Signed)
Dr Belia HemanKasa alerted to jerking tremors in left leg, order Ativan

## 2017-11-05 NOTE — Progress Notes (Addendum)
Nutrition Follow-up  DOCUMENTATION CODES:   Not applicable  INTERVENTION:  Initiate Vital High Protein at 20 mL/hr via OGT and advance by 15 mL every 8 hours to goal rate of 50 mL/hr (1200 mL goal daily volume). Also provide Pro-Stat 30 mL TID. Goal regimen provides 1500 kcal, 150 grams of protein, 1008 mL H2O daily. With current rates of propofol and D10 provides 1980 kcal.  Continue liquid MVI daily per tube.  NUTRITION DIAGNOSIS:   Inadequate oral intake related to inability to eat as evidenced by NPO status.  Ongoing - addressing with TF regimen.  GOAL:   Provide needs based on ASPEN/SCCM guidelines  Met with TF regimen.  MONITOR:   Vent status, Labs, Weight trends, TF tolerance, Skin, I & O's  REASON FOR ASSESSMENT:   Ventilator    ASSESSMENT:   27 year old male with PMHx of bronchitis, asthma, polysubstance abuse who is admitted after being found unresponsive in hotel room s/p cardiac arrest with unknown down time and found to be in ventricular fibrillation, also with renal failure, rhabdomyolysis, leukocytosis, shock, and ischemic right lower extremity after being in fetal position. Patient was intubated 3/8 and started on 36C TTM.   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 -Overnight patient began bleeding from RLE. He became bradycardic and hypotensive requiring atropine, pRBC tranfusions, and pressors.  Patient intubated and sedated. Two family members at bedside. Patient now s/p right AKA. On CVVHD with UF at 50 mL/hr. Now off vasopressors.  Access: 18 Fr. OGT placed 3/8; terminates in mid stomach per abdominal x-ray 3/8; now 59 cm at corner of mouth (was previously 60 cm)  MAP: 75-85 mmHg  TF: TFs and Pro-Stat were held yesterday as patient became hemodynamically unstable after bleed requiring pRBC transfusions and increasing pressors  Patient is currently intubated on ventilator support MV: 8.8 L/min Temp (24hrs), Avg:98.1 F (36.7 C), Min:97 F (36.1 C),  Max:98.8 F (37.1 C)  Propofol: 12 mL/hr (317 kcal daily)  Medications reviewed and include: Solu-Cortef 100 mg Q8hrs IV, pantoprazole, Unasyn, D10 at 20 mL/hr (48 grams dextrose, 163 kcal daily), fentanyl gtt, magnesium sulfate 2 grams IV once today, norepinephrine gtt now off, propofol gtt, vasopressin gtt now off.  Labs reviewed: CBG 74-129, BUN 26 (trending down), Creatinine 2.25 (trending down), Magnesium 1.6. Phosphorus 4.1 and Potassium 4.4.  I/O: 10 mL UOP yesterday  Weight trend: 80.2 kg on 3/14; +0.4 kg from admission  Discussed with RN and on rounds. Plan is to resume tube feeds today at a low rate. Transitioning from sodium citrate as anticoagulant to heparin.  Diet Order:  Diet NPO time specified  EDUCATION NEEDS:   No education needs have been identified at this time  Skin:  Skin Assessment: Reviewed RN Assessment(pale, mottled)  Last BM:  Unknown  Height:   Ht Readings from Last 1 Encounters:  10/30/17 '5\' 10"'  (1.778 m)    Weight:   Wt Readings from Last 1 Encounters:  11/05/17 176 lb 12.9 oz (80.2 kg)    Ideal Body Weight:  75.5 kg  BMI:  Body mass index is 25.37 kg/m.  Estimated Nutritional Needs:   Kcal:  1958 (PSU 2003b w/ MSJ 1776, Ve 8.7, Tmax 37.1)  Protein:  144-160 grams (1.8-2 grams/kg)  Fluid:  1.5 L/day  Willey Blade, MS, RD, LDN Office: [phone] Pager: [phone] After Hours/Weekend Pager: [phone]

## 2017-11-05 NOTE — Clinical Social Work Note (Signed)
[facility]. Patient is not eligible to go to a SNF due to no insurance at this time. He could pay out of pocket and go however this would be a decision for him to make. RN CM [facility] (which patient is not able to do at this time due to having no insurance.) York SpanielMonica Hartlee Amedee MSW,LCSW [phone]

## 2017-11-05 NOTE — Progress Notes (Signed)
Pharmacy Antibiotic Note  Joshua [name] is a 27 y.o. male admitted on 10/30/2017 with Vfib arrest and prolonged CPR. Patient with hyperkalemia, rhabdomyolysis, leukocytosis, and possible compartment syndrome. Pharmacy has been consulted for UnasynPatient with potential to transition to CRRT during current hospitalization.   Plan: Day 7-Unasyn 3g IV Q6hr.    Height: 5\' 10"  (177.8 cm) Weight: 176 lb 12.9 oz (80.2 kg) IBW/kg (Calculated) : 73  Temp (24hrs), Avg:98.2 F (36.8 C), Min:97 F (36.1 C), Max:98.8 F (37.1 C)  Recent Labs  Lab 10/30/17 2132 10/30/17 2356  10/31/17 0811  11/01/17 0549  11/04/17 0738 11/04/17 1212 11/04/17 1620 11/04/17 2014 11/04/17 2100 11/05/17 0250 11/05/17 0946  WBC  --   --    < >  --   --  31.8*   < > 18.5* 19.4* 14.8* 17.7*  --   --  15.9*  CREATININE 2.74*  --   --  3.45*   < > 2.74*   < > 2.40*  --  2.07*  --  2.25* 2.28* 2.25*  LATICACIDVEN 12.3* 12.3*  --  10.5*  --  2.9*  --   --   --   --   --   --   --   --    < > = values in this interval not displayed.    Estimated Creatinine Clearance: 50.5 mL/min (A) (by C-G formula based on SCr of 2.25 mg/dL (H)).    No Known Allergies  Antimicrobials this admission: Zosyn 3/8 x 1 Unasyn 3/8 >>  Vancomycin 3/8 >> 3/10  Dose adjustments this admission: N/A  Microbiology results: 3/8 MRSA PCR: negative 3/14 Tracheal Aspirate: pending   Thank you for allowing pharmacy to be a part of this patient's care.  Simpson,Michael L 11/05/2017 7:30 PM

## 2017-11-05 NOTE — Progress Notes (Signed)
 Vein and Vascular Surgery  Daily Progress Note   Subjective  - 2 Days Post-Op  Patient remains intubated and sedated   Dressing placed yesterday  is intact  Objective Vitals:   11/05/17 1200 11/05/17 1300 11/05/17 1400 11/05/17 1500  BP: (!) 106/55 (!) 108/57 (!) 108/59 (!) 100/54  Pulse: 73 77 75 74  Resp: 18 18 18 18   Temp: 97.9 F (36.6 C) 98.4 F (36.9 C) 98.2 F (36.8 C)   TempSrc: Bladder     SpO2: 100% 98% 100% 100%  Weight:      Height:        Intake/Output Summary (Last 24 hours) at 11/05/2017 1729 Last data filed at 11/05/2017 1500 Gross per 24 hour  Intake 2058.13 ml  Output 427 ml  Net 1631.13 ml  ,  PULM  intubated, no use of accessory muscles CV  No JVD, RRR Abd      No distended, nontender VASC   Right leg  Ace wrap is ntact with minimal blood staining  Laboratory CBC    Component Value Date/Time   WBC 15.9 (H) 11/05/2017 0946   HGB 8.1 (L) 11/05/2017 0946   HGB 16.2 05/08/2014 2307   HCT 24.3 (L) 11/05/2017 0946   HCT 48.0 05/08/2014 2307   PLT 131 (L) 11/05/2017 0946   PLT 297 05/08/2014 2307    BMET    Component Value Date/Time   NA 140 11/05/2017 0946   NA 141 05/08/2014 2307   K 4.4 11/05/2017 0946   K 4.0 05/08/2014 2307   CL 106 11/05/2017 0946   CL 103 05/08/2014 2307   CO2 25 11/05/2017 0946   CO2 31 05/08/2014 2307   GLUCOSE 92 11/05/2017 0946   GLUCOSE 95 05/08/2014 2307   BUN 26 (H) 11/05/2017 0946   BUN 19 (H) 05/08/2014 2307   CREATININE 2.25 (H) 11/05/2017 0946   CREATININE 1.02 05/08/2014 2307   CALCIUM 7.6 (L) 11/05/2017 0946   CALCIUM 9.6 05/08/2014 2307   GFRNONAA 38 (L) 11/05/2017 0946   GFRNONAA >60 05/08/2014 2307   GFRAA 44 (L) 11/05/2017 0946   GFRAA >60 05/08/2014 2307    Assessment/Planning: POD #2 s/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  No further episodes of bradycardia  blood pressure is less labile we will leave the postsurgical dressing on for 3 days    [name] Jack Mineau  11/05/2017, 5:29 PM

## 2017-11-05 NOTE — Progress Notes (Signed)
50 UF started this AM approx 1000 AM per Dr Ronn MelenaKolloru.  Vasopressin remains off, levophed was off when writer took over care at 0700.  Pts MAP is maintaining > 65   CRRT stopped at 1200 noon to replace cartridge (5 hours left) and change dialysate bags.  Rinseback performed per policy, dialysis catheter lumens packed with heparin.  CRRT restarted at approx 1300; heparin withdrawn from catheter lumens prior to reconnection.  CRRT running with no issues at this time.  Vital HP enteral feedings started at approx 1155.  20 cc flush water Q4H per policy (renal patient).  Pt remains easily agitated when stimulated, but mostly non purposeful.  Will not open eyes or follow commands.    No UOP this shift.

## 2017-11-05 NOTE — Progress Notes (Signed)
Patient's mother expressed a desire to change pt's code status to "Full Code."  She states that patient and his wife have been separated for months and "she shouldn't make his decisions."  Also stated patient's wife "isn't legal."  Patient's wife is Hispanic.  Writer told patient's mother that a family meeting with her, the patient's wife, the Palliative Np, and the Intensisivst is probably in order.  At this point the patient is somewhat responsive and his wife may be agreeable to full code anyway.  Writer will pass this along in report.  Also, report to Dr Belia HemanKasa that's pt's past two CBGs were in 2070s.  D10 rate increased to 50 ml/hr

## 2017-11-05 NOTE — Progress Notes (Signed)
Daily Progress Note   Patient Name: Joshua [name]       Date: 11/05/2017 DOB: [birth year]  Age: 27 y.o. MRN#: [id number] Attending Physician: Gladstone Lighter, MD Primary Care Physician: Patient, No Pcp Per Admit Date: 10/30/2017  Reason for Consultation/Follow-up: Establishing goals of care  Subjective: Patient resting in bed, intubated and sedated. Wife at bedside. Off pressors at this time. Wife is optimistic he will improve. Support offered to wife. Continuing current care. Leg jerking noted.   Length of Stay: 6  Current Medications: Scheduled Meds:  . chlorhexidine gluconate (MEDLINE KIT)  15 mL Mouth Rinse BID  . feeding supplement (PRO-STAT SUGAR FREE 64)  30 mL Per Tube TID  . hydrocortisone sod succinate (SOLU-CORTEF) inj  100 mg Intravenous Q8H  . mouth rinse  15 mL Mouth Rinse 10 times per day  . pantoprazole (PROTONIX) IV  40 mg Intravenous QHS  . sodium chloride flush  10-40 mL Intracatheter Q12H    Continuous Infusions: . ampicillin-sulbactam (UNASYN) IV Stopped (11/05/17 1244)  . anticoagulant sodium citrate    . dextrose 20 mL/hr at 11/05/17 1500  . feeding supplement (VITAL HIGH PROTEIN) 1,000 mL (11/05/17 1500)  . fentaNYL infusion INTRAVENOUS 300 mcg/hr (11/05/17 1500)  . norepinephrine (LEVOPHED) Adult infusion Stopped (11/05/17 0129)  . propofol (DIPRIVAN) infusion 25.063 mcg/kg/min (11/05/17 1500)  . pureflow 2,000 mL/hr at 11/05/17 1310  . vasopressin (PITRESSIN) infusion - *FOR SHOCK* Stopped (11/05/17 0820)    PRN Meds: anticoagulant sodium citrate, bisacodyl, dextrose, docusate sodium, fentaNYL, heparin, LORazepam, sennosides, sodium chloride flush  Physical Exam  Pulmonary/Chest:  Intubated  Musculoskeletal:  Intermittent leg shaking.   Skin: Skin is  warm and dry.            Vital Signs: BP (!) 100/54   Pulse 74   Temp 98.2 F (36.8 C)   Resp 18   Ht '5\' 10"'  (1.778 m)   Wt 80.2 kg (176 lb 12.9 oz)   SpO2 100%   BMI 25.37 kg/m  SpO2: SpO2: 100 % O2 Device: O2 Device: Ventilator O2 Flow Rate:    Intake/output summary:   Intake/Output Summary (Last 24 hours) at 11/05/2017 1613 Last data filed at 11/05/2017 1500 Gross per 24 hour  Intake 2058.13 ml  Output 427 ml  Net 1631.13 ml   LBM: Last  BM Date: 11/05/17 Baseline Weight: Weight: 74.8 kg (165 lb) Most recent weight: Weight: 80.2 kg (176 lb 12.9 oz)       Palliative Assessment/Data: Intubated/sedated    Flowsheet Rows     Most Recent Value  Intake Tab  Referral Department  Hospitalist  Unit at Time of Referral  Med/Surg Unit  Palliative Care Primary Diagnosis  Other (Comment) [multiporgan failure]  Date Notified  11/04/17  Palliative Care Type  New Palliative care  Reason for referral  Clarify Goals of Care  Date of Admission  10/30/17  Date first seen by Palliative Care  11/04/17  # of days Palliative referral response time  0 Day(s)  # of days IP prior to Palliative referral  5  Clinical Assessment  Psychosocial & Spiritual Assessment  Palliative Care Outcomes      Patient Active Problem List   Diagnosis Date Noted  . Acute kidney failure, unspecified ([place]) 11/02/2017  . Rhabdomyolysis 11/02/2017  . Acute respiratory failure ([place]) 11/02/2017  . Cardiogenic shock ([place]) 10/30/2017  . Cardiac arrest ([place]) 10/30/2017  . Drug abuse ([place]) 10/30/2017    Palliative Care Assessment & Plan   Patient Profile: 27 year old gentleman ith a past medical history of polysubstance abuse, status post prolonged cardiac arrest, respiratory failure, renal failure, rhabdomyolysis, shock.  Assessment/ Recommendations/Plan:  Continuing current care at this time.   Will continue to follow.   Code Status:    Code Status Orders  (From admission, onward)         Start     Ordered   11/04/17 1543  Limited resuscitation (code)  Continuous    Question Answer Comment  In the event of cardiac or respiratory ARREST: Initiate Code Blue, Call Rapid Response Yes   In the event of cardiac or respiratory ARREST: Perform CPR No   In the event of cardiac or respiratory ARREST: Perform Intubation/Mechanical Ventilation Yes   In the event of cardiac or respiratory ARREST: Use NIPPV/BiPAp only if indicated Yes   In the event of cardiac or respiratory ARREST: Administer ACLS medications if indicated Yes   In the event of cardiac or respiratory ARREST: Perform Defibrillation or Cardioversion if indicated No   Comments patient already intubated. Continue current intubation.      11/04/17 1544    Code Status History    Date Active Date Inactive Code Status Order ID Comments User Context   10/30/2017 21:10 11/04/2017 15:44 Full Code [id number]  Mikael Spray, NP Inpatient   10/30/2017 18:13 10/30/2017 21:10 Full Code [id number]  Vaughan Basta, MD Inpatient       Prognosis:  Poor.   Discharge Planning:  To Be Determined   Thank you for allowing the Palliative Medicine Team to assist in the care of this patient.   Total Time 35 min Prolonged Time Billed  NO       Greater than 50%  of this time was spent counseling and coordinating care related to the above assessment and plan.  Asencion Gowda, NP  Please contact Palliative Medicine Team phone at [phone] for questions and concerns.

## 2017-11-05 NOTE — Progress Notes (Signed)
   11/05/17 1130  Clinical Encounter Type  Visited With Patient and family together  Visit Type Follow-up  Spiritual Encounters  Spiritual Needs Prayer;Emotional   Chaplain introduced self to patient's mother and other family present.  Family shared feelings of gratitude for improvements in patient's condition.  Mother asked chaplain to offer a prayer for her son and the family.  Family members spoke of importance of faith and mother engaged in life review of patient, his qualities and his relationships.  Patient's mother asked question of patient and saw patient nod his head.  Mother asking for affirmation from others in the room of what she had seen, which others, including chaplain, did affirm.  Patient's mother spoke of her grandchildren and of their love/support of patient.  Family continues to be open to chaplain support as they have been in dialogue with other chaplains.  Patient's mother asked this chaplain to continue to pray for the patient.

## 2017-11-05 NOTE — Progress Notes (Signed)
[facility] Progess Note    SYNOPSIS   27 year old gentleman ith a past medical history of polysubstance abuse, status post prolonged cardiac arrest, respiratory failure, renal failure, rhabdomyolysis, shock  ASSESSMENT/PLAN   Status post cardiac arrest.   Patient's status has deteriorated. Required disarticulation/amputation secondary to persistent rhabdomyolysis and hypotension. Bleeding appears to be improved today. H&H is stable post transfusion  Respiratory failure. Continue mechanical ventilation secondary to deteriorating clinical status   Shock. Combination septic and hypovolemic. Has been transfused, continues on antibiotic therapy, hydrocortisone and pressors.  Polysubstance abuse. Known history of drug abuse. Per friends and family he snorts his drugs and are not taken IV area urine drug screen was positive for cocaine and opiates  Renal failure.  Will hold CRRT secondary to hypotension  Rhabdomyolysis. Status post amputation and disarticulation. [place] decreased to [address]  Leukocytosis. Elevated white count on broad-spectrum antibiotic coverage.  Patient is presently on Unasyn and vancomycin  Thrombocytopenia. Pending CBC from this morning platelets last night was 130  Critical care time 40 minutes  VENTILATOR SETTINGS: Vent Mode: PRVC FiO2 (%):  [30 %] 30 % Set Rate:  [18 bmp] 18 bmp Vt Set:  [500 mL] 500 mL PEEP:  [5 cmH20] 5 cmH20 Plateau Pressure:  [16 cmH20] 16 cmH20  HEMODYNAMICS: CVP:  [8 mmHg-55 mmHg] 55 mmHg  INTAKE / OUTPUT:  Intake/Output Summary (Last 24 hours) at 11/05/2017 0810 Last data filed at 11/05/2017 0600 Gross per 24 hour  Intake 3314.57 ml  Output 69 ml  Net 3245.57 ml    Name: Levonne HubertMark A Hopwood MRN: [id number] DOB: 08/12/1991    ADMISSION DATE:  10/30/2017  SUBJECTIVE:   Status post disarticulation secondary to persistent rhabdomyolysis and hypotension. Patient has had difficulty evening with recurrent  bleeding. Received platelets prior to procedure, INR has been checked along with CBC pending additional unit of packed red blood cells. This morning patient's pulse became significantly bradycardic and hypotensive. Given atropine and restarted back on norepinephrine. CRRT held secondary to hypotension. Discussed with mother.  VITAL SIGNS: Temp:  [96.8 F (36 C)-98.8 F (37.1 C)] 98.1 F (36.7 C) (03/14 0500) Pulse Rate:  [57-90] 57 (03/14 0600) Resp:  [18-19] 18 (03/14 0600) BP: (87-169)/(49-111) 112/69 (03/14 0600) SpO2:  [93 %-100 %] 98 % (03/14 0600) FiO2 (%):  [30 %] 30 % (03/14 0344) Weight:  [80.2 kg (176 lb 12.9 oz)] 80.2 kg (176 lb 12.9 oz) (03/14 0500)  PHYSICAL EXAMINATION: Physical Examination:   VS: BP 112/69   Pulse (!) 57   Temp 98.1 F (36.7 C)   Resp 18   Ht 5\' 10"  (1.778 m)   Wt 80.2 kg (176 lb 12.9 oz)   SpO2 98%   BMI 25.37 kg/m   General Appearance: critically ill unresponsive in the intensive care unit Neuro:patient does startle when you call his name Pulmonary: coarse rhonchi appreciated CardiovascularNormal S1,S2.  No m/r/g.   Abdomen: soft exam, positive bowel sounds Extremities: Status post disarticulation/amputation with soaking through of his wound and active bleeding.  LABORATORY PANEL:   CBC Recent Labs  Lab 11/04/17 2014  WBC 17.7*  HGB 8.8*  HCT 26.4*  PLT 130*    Chemistries  Recent Labs  Lab 11/04/17 0102  11/04/17 2100 11/05/17 0250  NA  --    < > 139 139  K  --    < > 3.7 3.9  CL  --    < > 106 106  CO2  --    < >  24 25  GLUCOSE  --    < > 90 96  BUN  --    < > 29* 27*  CREATININE  --    < > 2.25* 2.28*  CALCIUM  --    < > 7.9* 7.6*  MG  --    < > 1.7  --   PHOS  --    < > 1.8* 1.7*  AST 1,253*  --   --   --   ALT 485*  --   --   --   ALKPHOS 73  --   --   --   BILITOT 0.7  --   --   --    < > = values in this interval not displayed.    Recent Labs  Lab 11/04/17 0745 11/04/17 1217 11/04/17 1931 11/05/17 0013  11/05/17 0416 11/05/17 0740  GLUCAP 130* 104* 84 74 97 129*   Recent Labs  Lab 10/30/17 2300 10/31/17 1747 11/01/17 0023  PHART 7.22* 7.40 7.48*  PCO2ART 56* 48 35  PO2ART 75* 69* 55*   Recent Labs  Lab 10/31/17 0811  11/01/17 0549  11/04/17 0102  11/04/17 1620 11/04/17 2100 11/05/17 0250  AST 1,662*  --  2,233*  --  1,253*  --   --   --   --   ALT 659*  --  756*  --  485*  --   --   --   --   ALKPHOS 39  --  46  --  73  --   --   --   --   BILITOT 1.4*  --  1.2  --  0.7  --   --   --   --   ALBUMIN 2.6*   < > 2.5*   < > 2.5*   < > 2.2* 2.3* 2.3*   < > = values in this interval not displayed.    Cardiac Enzymes Recent Labs  Lab 11/02/17 0527  TROPONINI 2.43*    RADIOLOGY:  Dg Chest Port 1 View  Result Date: 11/04/2017 CLINICAL DATA:  27 year old male found down. Suspected drug overdose. V fib arrest status post CPR and resuscitation. Acute renal failure, rhabdomyolysis, right lower extremity rhabdomyolysis and ischemia requiring amputation yesterday. EXAM: PORTABLE CHEST 1 VIEW COMPARISON:  11/03/2017 and earlier. FINDINGS: Portable AP supine view at 0930 hours. Endotracheal tube tip is stable just below the clavicles. Enteric tube courses to the abdomen, side hole up the level of the stomach. Stable dual lumen right IJ vascular catheter. Stable single lumen left IJ central line. Pacer pad projects over the lower chest. Mediastinal contours remain normal. Bilateral veiling pulmonary opacity persists, greater on the right. No superimposed pneumothorax. Pulmonary vascularity is within normal limits. No acute osseous abnormality identified. Negative visible bowel gas pattern. IMPRESSION: 1.  Stable lines and tubes. 2. Stable ventilation. Right greater than left pleural effusions with lower lobe atelectasis. Electronically Signed   By: Odessa Fleming M.D.   On: 11/04/2017 09:59   Dg Chest Port 1 View  Result Date: 11/03/2017 CLINICAL DATA:  Intubation. EXAM: PORTABLE CHEST 1 VIEW  COMPARISON:  11/02/2017. FINDINGS: Endotracheal tube, right IJ line, left IJ line, NG tube in stable position. Heart size stable. Interim clearing of left pleural effusion. Persistent right pleural effusion. Persistent mild right base atelectasis/infiltrate. No pneumothorax. Heart size stable. IMPRESSION: 1.  Lines and tubes in stable position. 2. Interim clearing of left pleural effusion. Right pleural effusion and mild right base atelectasis/infiltrate  again noted without interim change. Electronically Signed   By: Maisie Fus  Register   On: 11/03/2017 10:47    Tora Kindred, DO  11/05/2017

## 2017-11-05 NOTE — Progress Notes (Signed)
MEDICATION RELATED CONSULT NOTE - FOLLOW UP   Pharmacy Consult for Electrolyte management Indication: hypophosphatemia  Allergies  Allergen Reactions  . Heparin     Patient being ruled out for HIT    Patient Measurements: Height: '5\' 10"'  (177.8 cm) Weight: 178 lb 12.7 oz (81.1 kg) IBW/kg (Calculated) : 73   Vital Signs: Temp: 98.8 F (37.1 C) (03/14 0300) Temp Source: Core (03/13 1600) BP: 121/64 (03/14 0300) Pulse Rate: 71 (03/14 0300) Intake/Output from previous day: 03/13 0701 - 03/14 0700 In: 3169.4 [I.V.:1529.4; Blood:600; NG/GT:30; IV Piggyback:1010] Out: 155  Intake/Output from this shift: Total I/O In: 591.5 [I.V.:591.5] Out: 0   Labs: Recent Labs    11/02/17 1520  11/03/17 2051 11/04/17 0102  11/04/17 0738 11/04/17 1212 11/04/17 1620 11/04/17 2014 11/04/17 2100 11/05/17 0250  WBC 23.5*   < >  --  18.1*  --  18.5* 19.4* 14.8* 17.7*  --   --   HGB 11.9*   < >  --  8.9*  --  7.0* 7.5* 8.4* 8.8*  --   --   HCT 35.8*   < >  --  26.9*  --  21.7* 22.6* 25.2* 26.4*  --   --   PLT 66*   < >  --  156  --  140* 132* 122* 130*  --   --   APTT 40*  --   --   --   --   --   --   --   --   --   --   CREATININE 2.58*   < > 2.27*  --    < > 2.40*  --  2.07*  --  2.25* 2.28*  MG  --    < > 1.8  --   --  1.8  --   --   --  1.7  --   PHOS 4.6   < > 3.2  --    < > 3.7  --  2.0*  --  1.8* 1.7*  ALBUMIN 2.6*   < > 2.6* 2.5*   < > 2.3*  --  2.2*  --  2.3* 2.3*  PROT  --   --   --  5.5*  --   --   --   --   --   --   --   AST  --   --   --  1,253*  --   --   --   --   --   --   --   ALT  --   --   --  485*  --   --   --   --   --   --   --   ALKPHOS  --   --   --  73  --   --   --   --   --   --   --   BILITOT  --   --   --  0.7  --   --   --   --   --   --   --   BILIDIR  --   --   --  0.1  --   --   --   --   --   --   --   IBILI  --   --   --  0.6  --   --   --   --   --   --   --    < > =  values in this interval not displayed.   Estimated Creatinine Clearance: 49.8  mL/min (A) (by C-G formula based on SCr of 2.28 mg/dL (H)).   Microbiology: Recent Results (from the past 720 hour(s))  MRSA PCR Screening     Status: None   Collection Time: 10/30/17  7:54 PM  Result Value Ref Range Status   MRSA by PCR NEGATIVE NEGATIVE Final    Comment:        The GeneXpert MRSA Assay (FDA approved for NASAL specimens only), is one component of a comprehensive MRSA colonization surveillance program. It is not intended to diagnose MRSA infection nor to guide or monitor treatment for MRSA infections. Performed at [facility], [address] [place], [place] [address]     Medications:  Scheduled:  . chlorhexidine gluconate (MEDLINE KIT)  15 mL Mouth Rinse BID  . feeding supplement (NEPRO CARB STEADY)  1,000 mL Per Tube Q24H  . feeding supplement (PRO-STAT SUGAR FREE 64)  60 mL Per Tube TID  . hydrocortisone sod succinate (SOLU-CORTEF) inj  100 mg Intravenous Q8H  . mouth rinse  15 mL Mouth Rinse 10 times per day  . pantoprazole (PROTONIX) IV  40 mg Intravenous QHS  . sodium chloride flush  10-40 mL Intracatheter Q12H    Assessment: 27 yo male admitted with cardiac arrest and found to have ventricular fibrillation. Patient has known history of polysubstance abuse and his drug screen was positive for cocaine and opioids. Patient is critically ill and has acute renal failure. Patient is on hypothermia protocol and will be started on CRRT after dialysis catheter placement. Pharmacy has been consulted to monitor and replace electrolytes. Electrolyte replacement should be done cautiously as patient is critically ill and on hypothermia protocol.  K current 3.8 Phos 2.1  Goal of Therapy:  K > 4 Phos 2.5 - 4.5  Plan:  Will replace w/ Kphos 30 mmol IV x 1 over 6 hour and will recheck electrolytes @ 1000. This replacement contains 44 mEq of K+ which will theoretically increase K+ to 4.2  03/13 @ 2100 Phos 1.7, K+ 3.9. Will replace w/ Kphos 45 mmol IV x  1 over 6 hours and will recheck electrolytes @ 0900. This will provide 66 mEq of K+ which should theoretically increase K+ to 4.5; although not likely considering K+ has only gone up by 0.2 mEq after prior replacement.  Tobie Lords, PharmD, BCPS Clinical Pharmacist 11/05/2017

## 2017-11-05 NOTE — Progress Notes (Signed)
Subjective: Patient remains intubated and sedated.  Per family has appeared to be aware.  Previously noted to follow some commands.    Objective: Current vital signs: BP (!) 108/59   Pulse 75   Temp 98.2 F (36.8 C)   Resp 18   Ht _0  (1.778 m)   Wt 80.2 kg (176 lb 12.9 oz)   SpO2 100%   BMI 25.37 kg/m  Vital signs in last 24 hours: Temp:  [97 F (36.1 C)-98.8 F (37.1 C)] 98.2 F (36.8 C) (03/14 1400) Pulse Rate:  [53-79] 75 (03/14 1400) Resp:  [18-19] 18 (03/14 1400) BP: (90-121)/(52-74) 108/59 (03/14 1400) SpO2:  [93 %-100 %] 100 % (03/14 1400) FiO2 (%):  [30 %] 30 % (03/14 1200) Weight:  [80.2 kg (176 lb 12.9 oz)] 80.2 kg (176 lb 12.9 oz) (03/14 0500)  Intake/Output from previous day: 03/13 0701 - 03/14 0700 In: 3489.4 [I.V.:1849.4; Blood:600; NG/GT:30; IV Piggyback:1010] Out: 165 [Urine:10] Intake/Output this shift: Total I/O In: 571.1 [I.V.:451.7; NG/GT:69.3; IV Piggyback:50] Out: 370 [Emesis/NG output:100; Other:150; Stool:120] Nutritional status: Diet NPO time specified  Neurologic Exam: Mental Status: Patient does not respond to verbal stimuli.  Grimaces deep sternal rub.  Does not follow commands.  No verbalizations noted.  Cranial Nerves: II: patient does not respond confrontation bilaterally, pupils right 3 mm, left 3 mm,and reactive bilaterally III,IV,VI: doll's response present bilaterally.  V,VII: corneal reflex reduced bilaterally  VIII: patient does not respond to verbal stimuli IX,X: gag reflex reduced, XI: trapezius strength unable to test bilaterally XII: tongue strength unable to test Motor: At times lifts both arms at the elbows.  No other movement noted of the extremities Sensory: Does not respond to noxious stimuli in any extremity.   Lab Results: Basic Metabolic Panel: Recent Labs  Lab 11/03/17 1034  11/03/17 2051  11/04/17 0738 11/04/17 1620 11/04/17 2100 11/05/17 0250 11/05/17 0946  NA 138   < > 139   < > 137 139 139 139  140  K 4.1   < > 3.9   < > 4.3 3.6 3.7 3.9 4.4  CL 104   < > 104   < > 104 107 106 106 106  CO2 26   < > 25   < > _1 GLUCOSE 139*   < > 138*   < > 149* 104* 90 96 92  BUN 30*   < > 30*   < > 32* 30* 29* 27* 26*  CREATININE 2.32*   < > 2.27*   < > 2.40* 2.07* 2.25* 2.28* 2.25*  CALCIUM 7.7*   < > 7.8*   < > 7.3* 7.4* 7.9* 7.6* 7.6*  MG 2.0  --  1.8  --  1.8  --  1.7  --  1.6*  PHOS 3.3   < > 3.2   < > 3.7 2.0* 1.8* 1.7* 4.1   < > = values in this interval not displayed.    Liver Function Tests: Recent Labs  Lab 10/30/17 1204 10/30/17 2132 10/31/17 0811  11/01/17 0549  11/04/17 0102  11/04/17 0738 11/04/17 1620 11/04/17 2100 11/05/17 0250 11/05/17 0946  AST 316* 1,027* 1,662*  --  2,233*  --  1,253*  --   --   --   --   --   --   ALT 233* 474* 659*  --  756*  --  485*  --   --   --   --   --   --  ALKPHOS 84 44 39  --  46  --  73  --   --   --   --   --   --   BILITOT 1.4* 1.8* 1.4*  --  1.2  --  0.7  --   --   --   --   --   --   PROT 8.1 3.8* 4.9*  --  4.8*  --  5.5*  --   --   --   --   --   --   ALBUMIN 4.9 2.2* 2.6*   < > 2.5*   < > 2.5*   < > 2.3* 2.2* 2.3* 2.3* 2.3*   < > = values in this interval not displayed.   No results for input(s): LIPASE, AMYLASE in the last 168 hours. Recent Labs  Lab 10/30/17 2132  AMMONIA 32    CBC: Recent Labs  Lab 10/30/17 1204 10/30/17 1858  11/04/17 0738 11/04/17 1212 11/04/17 1620 11/04/17 2014 11/05/17 0946  WBC 49.5* 39.5*   < > 18.5* 19.4* 14.8* 17.7* 15.9*  NEUTROABS 39.0* 37.0*  --   --   --   --  12.2* 12.9*  HGB 17.1 16.3   < > 7.0* 7.5* 8.4* 8.8* 8.1*  HCT 55.7* 50.9   < > 21.7* 22.6* 25.2* 26.4* 24.3*  MCV 100.3* 95.4   < > 94.7 92.4 92.0 91.5 90.9  PLT 282 214   < > 140* 132* 122* 130* 131*   < > = values in this interval not displayed.    Cardiac Enzymes: Recent Labs  Lab 10/30/17 2132 10/31/17 0216  10/31/17 1610  11/01/17 0549  11/02/17 0527  11/03/17 1842 11/04/17 0628 11/04/17 0738  11/04/17 1620 11/05/17 0453  CKTOTAL >50,000* >50,000*   < >  --    < > >50,000*   < > >50,000*   < > >50,000* 29,343* 30,625* 26,190* 21,105*  TROPONINI 3.31* 6.44*  --  6.06*  --  7.17*  --  2.43*  --   --   --   --   --   --    < > = values in this interval not displayed.    Lipid Panel: Recent Labs  Lab 10/31/17 2029 11/03/17 1842  TRIG 133 264*    CBG: Recent Labs  Lab 11/04/17 1931 11/05/17 0013 11/05/17 0416 11/05/17 0740 11/05/17 1143  GLUCAP 84 74 97 129* 34    Microbiology: Results for orders placed or performed during the hospital encounter of 10/30/17  MRSA PCR Screening     Status: None   Collection Time: 10/30/17  7:54 PM  Result Value Ref Range Status   MRSA by PCR NEGATIVE NEGATIVE Final    Comment:        The GeneXpert MRSA Assay (FDA approved for NASAL specimens only), is one component of a comprehensive MRSA colonization surveillance program. It is not intended to diagnose MRSA infection nor to guide or monitor treatment for MRSA infections. Performed at [facility], [address] [place], [place] [address]   Culture, respiratory (NON-Expectorated)     Status: None (Preliminary result)   Collection Time: 11/05/17  8:17 AM  Result Value Ref Range Status   Specimen Description   Final    TRACHEAL ASPIRATE Performed at [facility], [address], [place], [place] [address]    Special Requests   Final    NONE Performed at [facility], [address] [place], [place] [address]  Gram Stain   Final    FEW WBC PRESENT, PREDOMINANTLY PMN MODERATE GRAM NEGATIVE RODS RARE YEAST Performed at [facility], [place] [address], [place], [place] [address]    Culture PENDING  Incomplete   Report Status PENDING  Incomplete    Coagulation Studies: Recent Labs    11/02/17 1520 11/03/17 0411 11/04/17 0102  LABPROT 14.5 14.0 14.3  INR 1.14 1.09 1.12    Imaging: Dg Chest Port 1 View  Result Date:  11/04/2017 CLINICAL DATA:  27 year old male found down. Suspected drug overdose. V fib arrest status post CPR and resuscitation. Acute renal failure, rhabdomyolysis, right lower extremity rhabdomyolysis and ischemia requiring amputation yesterday. EXAM: PORTABLE CHEST 1 VIEW COMPARISON:  11/03/2017 and earlier. FINDINGS: Portable AP supine view at 0930 hours. Endotracheal tube tip is stable just below the clavicles. Enteric tube courses to the abdomen, side hole up the level of the stomach. Stable dual lumen right IJ vascular catheter. Stable single lumen left IJ central line. Pacer pad projects over the lower chest. Mediastinal contours remain normal. Bilateral veiling pulmonary opacity persists, greater on the right. No superimposed pneumothorax. Pulmonary vascularity is within normal limits. No acute osseous abnormality identified. Negative visible bowel gas pattern. IMPRESSION: 1.  Stable lines and tubes. 2. Stable ventilation. Right greater than left pleural effusions with lower lobe atelectasis. Electronically Signed   By: Genevie Ann M.D.   On: 11/04/2017 09:59    Medications:  I have reviewed the patient's current medications. Scheduled: . chlorhexidine gluconate (MEDLINE KIT)  15 mL Mouth Rinse BID  . feeding supplement (PRO-STAT SUGAR FREE 64)  30 mL Per Tube TID  . hydrocortisone sod succinate (SOLU-CORTEF) inj  100 mg Intravenous Q8H  . mouth rinse  15 mL Mouth Rinse 10 times per day  . pantoprazole (PROTONIX) IV  40 mg Intravenous QHS  . sodium chloride flush  10-40 mL Intracatheter Q12H    Assessment/Plan: Patient remains intubated and sedated but is having some responses.  Full evaluation unable to be performed at this time due to need for sedation.    Will continue to follow with you.  Family questions addressed.      LOS: 6 days   Alexis Goodell, MD Neurology [phone] 11/05/2017  2:19 PM

## 2017-11-05 NOTE — Progress Notes (Signed)
[employer] [facility] [place] at [facility]   PATIENT NAME: Joshua PlanasMark Vandeusen    MR#:  [id number]  DATE OF BIRTH:  03/06/1991  SUBJECTIVE:  CHIEF COMPLAINT:   Chief Complaint  Patient presents with  . Drug Overdose   - remains intubated, on CRRT -s/p right leg disarticulated at the knee joint. Bleeding is improved with reinforced dressing- -received transfusion  REVIEW OF SYSTEMS:  Review of Systems  Unable to perform ROS: Critical illness    DRUG ALLERGIES:   Allergies  Allergen Reactions  . Heparin     Patient being ruled out for HIT    VITALS:  Blood pressure 114/73, pulse 64, temperature (!) 97 F (36.1 C), temperature source Bladder, resp. rate 18, height 5\' 10"  (1.778 m), weight 80.2 kg (176 lb 12.9 oz), SpO2 100 %.  PHYSICAL EXAMINATION:  Physical Exam  GENERAL:  27 y.o.-year-old patient lying in the bed, critically ill appearing EYES: Pupils equal, round, dilated, reactive to light and accommodation. No scleral icterus. Extraocular muscles intact. Swollen eyelids noted HEENT: Head atraumatic, normocephalic. Oropharynx and nasopharynx clear.  NECK:  Supple, no jugular venous distention. No thyroid enlargement, no tenderness.  LUNGS: Normal breath sounds bilaterally, no wheezing, rales,rhonchi or crepitation. No use of accessory muscles of respiration.  CARDIOVASCULAR: S1, S2 normal. No murmurs, rubs, or gallops.  ABDOMEN: Soft, nontender, nondistended. Bowel sounds present. No organomegaly or mass.  EXTREMITIES:  Right leg disarticulated at the knee- dressing with minimal blood only. NEUROLOGIC: Sedated.  PSYCHIATRIC: The patient is sedated. Non-purposeful movements at times SKIN: No obvious rash, lesion, or ulcer.    LABORATORY PANEL:   CBC Recent Labs  Lab 11/04/17 2014  WBC 17.7*  HGB 8.8*  HCT 26.4*  PLT 130*   ------------------------------------------------------------------------------------------------------------------  Chemistries    Recent Labs  Lab 11/04/17 0102  11/05/17 0946  NA  --    < > 140  K  --    < > 4.4  CL  --    < > 106  CO2  --    < > 25  GLUCOSE  --    < > 92  BUN  --    < > 26*  CREATININE  --    < > 2.25*  CALCIUM  --    < > 7.6*  MG  --    < > 1.6*  AST 1,253*  --   --   ALT 485*  --   --   ALKPHOS 73  --   --   BILITOT 0.7  --   --    < > = values in this interval not displayed.   ------------------------------------------------------------------------------------------------------------------  Cardiac Enzymes Recent Labs  Lab 11/02/17 0527  TROPONINI 2.43*   ------------------------------------------------------------------------------------------------------------------  RADIOLOGY:  Dg Chest Port 1 View  Result Date: 11/04/2017 CLINICAL DATA:  27 year old male found down. Suspected drug overdose. V fib arrest status post CPR and resuscitation. Acute renal failure, rhabdomyolysis, right lower extremity rhabdomyolysis and ischemia requiring amputation yesterday. EXAM: PORTABLE CHEST 1 VIEW COMPARISON:  11/03/2017 and earlier. FINDINGS: Portable AP supine view at 0930 hours. Endotracheal tube tip is stable just below the clavicles. Enteric tube courses to the abdomen, side hole up the level of the stomach. Stable dual lumen right IJ vascular catheter. Stable single lumen left IJ central line. Pacer pad projects over the lower chest. Mediastinal contours remain normal. Bilateral veiling pulmonary opacity persists, greater on the right. No superimposed pneumothorax. Pulmonary vascularity is within normal limits.  No acute osseous abnormality identified. Negative visible bowel gas pattern. IMPRESSION: 1.  Stable lines and tubes. 2. Stable ventilation. Right greater than left pleural effusions with lower lobe atelectasis. Electronically Signed   By: Odessa Fleming M.D.   On: 11/04/2017 09:59    EKG:   Orders placed or performed during the hospital encounter of 10/30/17  . EKG 12-Lead  . EKG 12-Lead   . EKG 12-Lead  . EKG 12-Lead  . EKG 12-Lead  . EKG 12-Lead  . EKG 12-Lead  . EKG 12-Lead    ASSESSMENT AND PLAN:   27 year old male with known history of asthma, tobacco abuse presents to hospital secondary to an unresponsive state.  1. Acute cardio-respiratory failure-concern for drug overdose. - cardiac arrhythmia from electrolyte abnormalities likely  - decreased EF from stunned myocardium- repeat ECHO again after recovery -Intubated, on ventilator. -On sedation  -Appreciate pulmonary intensive care consult and neurology consult - remains critically ill  2. Acute renal failure and hyperkalemia-secondary to rhabdomyolysis. -Appreciate nephrology consult.  -on CRRT, remains oliguric -even if he recovers, will need dialysis at this time  3. Rhabdomyolysis-possible ischemic right leg with compartment syndrome. -Appreciate vascular consult.  -CPK is slowly improving -s/p right leg disarticulation at the knee  4. Sepsis-significantly elevated pro calcitonin, WBC and lactic acid -Continue broad-spectrum antibiotics with  Unasyn at this time -vancomycin has been discontinued - on stress dose steroids  5. DIC- low plts, active bleeding, elevated fibrin derivatives -no active bleeding at this time. Platelets are improving. Check CBC today  6. Polysubstance abuse-will be addressed after extubation. Will need psych consult as well.  6. DVT prophylaxis - Ted's and SCDs  Patient is critically ill. Cousin at bedside and updated    All the records are reviewed and case discussed with Care Management/Social Workerr. Management plans discussed with the patient, family and they are in agreement.  CODE STATUS: Partial code  TOTAL TIME TAKING CARE OF THIS PATIENT: 34 minutes.   POSSIBLE D/C IN ? DAYS, DEPENDING ON CLINICAL CONDITION.   Enid Baas M.D on 11/05/2017 at 10:44 AM  Between 7am to 6pm - Pager - [phone]  After 6pm go to [email or web address] - password [email or web address]  [facility]  Office  [phone]  CC: Primary care physician; Patient, No Pcp Per

## 2017-11-05 NOTE — Progress Notes (Signed)
[facility]  ROUNDING NOTE   Subjective:   CRRT. Off vasopressors. Anuric. UF off  Objective:  Vital signs in last 24 hours:  Temp:  [96.8 F (36 C)-98.8 F (37.1 C)] 97 F (36.1 C) (03/14 0800) Pulse Rate:  [53-75] 64 (03/14 0800) Resp:  [18-19] 18 (03/14 0800) BP: (87-121)/(49-74) 114/73 (03/14 0800) SpO2:  [93 %-100 %] 100 % (03/14 0800) FiO2 (%):  [30 %] 30 % (03/14 0800) Weight:  [80.2 kg (176 lb 12.9 oz)] 80.2 kg (176 lb 12.9 oz) (03/14 0500)  Weight change: -0.9 kg (-15.8 oz) Filed Weights   11/03/17 0500 11/04/17 0500 11/05/17 0500  Weight: 82.3 kg (181 lb 7 oz) 81.1 kg (178 lb 12.7 oz) 80.2 kg (176 lb 12.9 oz)    Intake/Output: I/O last 3 completed shifts: In: 22 [I.V.:2542; Blood:967; NG/GT:30; IV Piggyback:1010] Out: 1330 [Urine:25; Other:1305]   Intake/Output this shift:  Total I/O In: 74.7 [I.V.:74.7] Out: 0   Physical Exam: General: Critically ill  Head: ETT  Eyes:  PERRL  Neck: Right dialysis catheter, Left TLC  Lungs:  PRVC 30%  Heart: Regular rate and rhythm  Abdomen:  Soft, nontender,   Extremities: Right amputation, left with cyanosis, 1+ dorsalis pedis pulse  Neurologic: Intubated, sedated  Skin: Mottled lower extremities  Access: RIJ temp HD catheter 3/8 Dr. Jefferson Fuel    Basic Metabolic Panel: Recent Labs  Lab 11/02/17 2124  11/03/17 1034  11/03/17 2051 11/04/17 0250 11/04/17 0738 11/04/17 1620 11/04/17 2100 11/05/17 0250  NA 139   < > 138   < > 139 138 137 139 139 139  K 3.9   < > 4.1   < > 3.9 3.8 4.3 3.6 3.7 3.9  CL 103   < > 104   < > 104 106 104 107 106 106  CO2 26   < > 26   < > _0 GLUCOSE 143*   < > 139*   < > 138* 114* 149* 104* 90 96  BUN 26*   < > 30*   < > 30* 31* 32* 30* 29* 27*  CREATININE 2.37*   < > 2.32*   < > 2.27* 2.28* 2.40* 2.07* 2.25* 2.28*  CALCIUM 7.6*   < > 7.7*   < > 7.8* 7.5* 7.3* 7.4* 7.9* 7.6*  MG 2.0  --  2.0  --  1.8  --  1.8  --  1.7  --   PHOS 3.8   < > 3.3   < >  3.2 2.1* 3.7 2.0* 1.8* 1.7*   < > = values in this interval not displayed.    Liver Function Tests: Recent Labs  Lab 10/30/17 1204 10/30/17 2132 10/31/17 0811  11/01/17 0549  11/04/17 0102 11/04/17 0250 11/04/17 0738 11/04/17 1620 11/04/17 2100 11/05/17 0250  AST 316* 1,027* 1,662*  --  2,233*  --  1,253*  --   --   --   --   --   ALT 233* 474* 659*  --  756*  --  485*  --   --   --   --   --   ALKPHOS 84 44 39  --  46  --  73  --   --   --   --   --   BILITOT 1.4* 1.8* 1.4*  --  1.2  --  0.7  --   --   --   --   --  PROT 8.1 3.8* 4.9*  --  4.8*  --  5.5*  --   --   --   --   --   ALBUMIN 4.9 2.2* 2.6*   < > 2.5*   < > 2.5* 2.4* 2.3* 2.2* 2.3* 2.3*   < > = values in this interval not displayed.   No results for input(s): LIPASE, AMYLASE in the last 168 hours. Recent Labs  Lab 10/30/17 2132  AMMONIA 32    CBC: Recent Labs  Lab 10/30/17 1204 10/30/17 1858  11/04/17 0102 11/04/17 0738 11/04/17 1212 11/04/17 1620 11/04/17 2014  WBC 49.5* 39.5*   < > 18.1* 18.5* 19.4* 14.8* 17.7*  NEUTROABS 39.0* 37.0*  --   --   --   --   --  12.2*  HGB 17.1 16.3   < > 8.9* 7.0* 7.5* 8.4* 8.8*  HCT 55.7* 50.9   < > 26.9* 21.7* 22.6* 25.2* 26.4*  MCV 100.3* 95.4   < > 94.1 94.7 92.4 92.0 91.5  PLT 282 214   < > 156 140* 132* 122* 130*   < > = values in this interval not displayed.    Cardiac Enzymes: Recent Labs  Lab 10/30/17 2132 10/31/17 0216  10/31/17 7412  11/01/17 0549  11/02/17 0527  11/03/17 1842 11/04/17 0628 11/04/17 0738 11/04/17 1620 11/05/17 0453  CKTOTAL >50,000* >50,000*   < >  --    < > >50,000*   < > >50,000*   < > >50,000* 29,343* 30,625* 26,190* 21,105*  TROPONINI 3.31* 6.44*  --  6.06*  --  7.17*  --  2.43*  --   --   --   --   --   --    < > = values in this interval not displayed.    BNP: Invalid input(s): POCBNP  CBG: Recent Labs  Lab 11/04/17 1217 11/04/17 1931 11/05/17 0013 11/05/17 0416 11/05/17 0740  GLUCAP 104* 84 74 97 129*     Microbiology: Results for orders placed or performed during the hospital encounter of 10/30/17  MRSA PCR Screening     Status: None   Collection Time: 10/30/17  7:54 PM  Result Value Ref Range Status   MRSA by PCR NEGATIVE NEGATIVE Final    Comment:        The GeneXpert MRSA Assay (FDA approved for NASAL specimens only), is one component of a comprehensive MRSA colonization surveillance program. It is not intended to diagnose MRSA infection nor to guide or monitor treatment for MRSA infections. Performed at [facility], [address] [place], [place] [address]     Coagulation Studies: Recent Labs    11/02/17 1520 11/03/17 0411 11/04/17 0102  LABPROT 14.5 14.0 14.3  INR 1.14 1.09 1.12    Urinalysis: No results for input(s): COLORURINE, LABSPEC, PHURINE, GLUCOSEU, HGBUR, BILIRUBINUR, KETONESUR, PROTEINUR, UROBILINOGEN, NITRITE, LEUKOCYTESUR in the last 72 hours.  Invalid input(s): APPERANCEUR    Imaging: Dg Chest Port 1 View  Result Date: 11/04/2017 CLINICAL DATA:  27 year old male found down. Suspected drug overdose. V fib arrest status post CPR and resuscitation. Acute renal failure, rhabdomyolysis, right lower extremity rhabdomyolysis and ischemia requiring amputation yesterday. EXAM: PORTABLE CHEST 1 VIEW COMPARISON:  11/03/2017 and earlier. FINDINGS: Portable AP supine view at 0930 hours. Endotracheal tube tip is stable just below the clavicles. Enteric tube courses to the abdomen, side hole up the level of the stomach. Stable dual lumen right IJ vascular catheter. Stable single lumen left IJ central line.  Pacer pad projects over the lower chest. Mediastinal contours remain normal. Bilateral veiling pulmonary opacity persists, greater on the right. No superimposed pneumothorax. Pulmonary vascularity is within normal limits. No acute osseous abnormality identified. Negative visible bowel gas pattern. IMPRESSION: 1.  Stable lines and tubes. 2. Stable  ventilation. Right greater than left pleural effusions with lower lobe atelectasis. Electronically Signed   By: Genevie Ann M.D.   On: 11/04/2017 09:59   Dg Chest Port 1 View  Result Date: 11/03/2017 CLINICAL DATA:  Intubation. EXAM: PORTABLE CHEST 1 VIEW COMPARISON:  11/02/2017. FINDINGS: Endotracheal tube, right IJ line, left IJ line, NG tube in stable position. Heart size stable. Interim clearing of left pleural effusion. Persistent right pleural effusion. Persistent mild right base atelectasis/infiltrate. No pneumothorax. Heart size stable. IMPRESSION: 1.  Lines and tubes in stable position. 2. Interim clearing of left pleural effusion. Right pleural effusion and mild right base atelectasis/infiltrate again noted without interim change. Electronically Signed   By: Marcello Moores  Register   On: 11/03/2017 10:47     Medications:   . ampicillin-sulbactam (UNASYN) IV Stopped (11/05/17 0547)  . anticoagulant sodium citrate    . dextrose 20 mL/hr at 11/05/17 0800  . fentaNYL infusion INTRAVENOUS 300 mcg/hr (11/05/17 0810)  . norepinephrine (LEVOPHED) Adult infusion Stopped (11/05/17 0129)  . propofol (DIPRIVAN) infusion 30 mcg/kg/min (11/05/17 0810)  . pureflow 2,000 mL/hr at 11/05/17 0435  . vasopressin (PITRESSIN) infusion - *FOR SHOCK* Stopped (11/05/17 0820)   . chlorhexidine gluconate (MEDLINE KIT)  15 mL Mouth Rinse BID  . feeding supplement (NEPRO CARB STEADY)  1,000 mL Per Tube Q24H  . feeding supplement (PRO-STAT SUGAR FREE 64)  60 mL Per Tube TID  . hydrocortisone sod succinate (SOLU-CORTEF) inj  100 mg Intravenous Q8H  . mouth rinse  15 mL Mouth Rinse 10 times per day  . pantoprazole (PROTONIX) IV  40 mg Intravenous QHS  . sodium chloride flush  10-40 mL Intracatheter Q12H   anticoagulant sodium citrate, bisacodyl, dextrose, docusate sodium, fentaNYL, midazolam, sennosides, sodium chloride flush  Assessment/ Plan:  Mr. FOREST REDWINE is a 27 y.o. white male with asthma, bronchitis,  polysubstance abuse, was admitted on3/8/2019after he was found down in a hotel for an unknown period of time in the fetal position. Hospital course is complicated by prolonged CPR for 45 minutes in the emergency room. Ventricular fibrillation arrest, hypothermia protocol.  Urine tox screen positive for cocaine and opiates.  Hospital course complicated by acute respiratory failure and acute renal failure  1.  Acute renal failure: anuric. Requiring renal replacement therapy. Continuing to get renal injury from hypotension and rhabdomyolysis.  - Continue CRRT.  - Restart ultrafiltation  2.  Rhabdomyolysis: cocaine induced rhabdomyolysis. CPK trending down to 21,105 this morning.   3.  Acute respiratory failure requiring ventilator support  4.  Shock- elevated LFTs, Troponin. Cardiogenic and septic. WBC pending for today.  - Empiric unasyn  - off vasopressors.   5. Right lower extremity ischemia: status post disarticulation by Dr. Delana Meyer on 3/12   LOS: 6 Joshua [name] 3/14/20199:48 AM

## 2017-11-06 ENCOUNTER — Inpatient Hospital Stay: Payer: Self-pay

## 2017-11-06 LAB — GLUCOSE, CAPILLARY
GLUCOSE-CAPILLARY: 79 mg/dL (ref 65–99)
Glucose-Capillary: 110 mg/dL — ABNORMAL HIGH (ref 65–99)
Glucose-Capillary: 76 mg/dL (ref 65–99)
Glucose-Capillary: 81 mg/dL (ref 65–99)
Glucose-Capillary: 80 mg/dL (ref 65–99)
Glucose-Capillary: 75 mg/dL (ref 65–99)

## 2017-11-06 LAB — RENAL FUNCTION PANEL
ALBUMIN: 2.2 g/dL — AB (ref 3.5–5.0)
ANION GAP: 6 (ref 5–15)
ANION GAP: 10 (ref 5–15)
Albumin: 2.4 g/dL — ABNORMAL LOW (ref 3.5–5.0)
Albumin: 2.5 g/dL — ABNORMAL LOW (ref 3.5–5.0)
Albumin: 2.1 g/dL — ABNORMAL LOW (ref 3.5–5.0)
Anion gap: 10 (ref 5–15)
Anion gap: 6 (ref 5–15)
BUN: 30 mg/dL — AB (ref 6–20)
BUN: 30 mg/dL — ABNORMAL HIGH (ref 6–20)
BUN: 31 mg/dL — ABNORMAL HIGH (ref 6–20)
BUN: 29 mg/dL — AB (ref 6–20)
CALCIUM: 8.3 mg/dL — AB (ref 8.9–10.3)
CHLORIDE: 102 mmol/L (ref 101–111)
CHLORIDE: 105 mmol/L (ref 101–111)
CO2: 28 mmol/L (ref 22–32)
CO2: 24 mmol/L (ref 22–32)
CO2: 26 mmol/L (ref 22–32)
CO2: 26 mmol/L (ref 22–32)
CREATININE: 2.12 mg/dL — AB (ref 0.61–1.24)
Calcium: 7.9 mg/dL — ABNORMAL LOW (ref 8.9–10.3)
Calcium: 8 mg/dL — ABNORMAL LOW (ref 8.9–10.3)
Calcium: 8 mg/dL — ABNORMAL LOW (ref 8.9–10.3)
Chloride: 103 mmol/L (ref 101–111)
Chloride: 102 mmol/L (ref 101–111)
Creatinine, Ser: 2.19 mg/dL — ABNORMAL HIGH (ref 0.61–1.24)
Creatinine, Ser: 2.04 mg/dL — ABNORMAL HIGH (ref 0.61–1.24)
Creatinine, Ser: 2.03 mg/dL — ABNORMAL HIGH (ref 0.61–1.24)
GFR calc Af Amer: 45 mL/min — ABNORMAL LOW (ref >60)
GFR calc Af Amer: 49 mL/min — ABNORMAL LOW (ref >60)
GFR calc Af Amer: 50 mL/min — ABNORMAL LOW (ref >60)
GFR calc non Af Amer: 39 mL/min — ABNORMAL LOW (ref >60)
GFR calc non Af Amer: 43 mL/min — ABNORMAL LOW (ref >60)
GFR calc non Af Amer: 41 mL/min — ABNORMAL LOW (ref >60)
GFR calc non Af Amer: 43 mL/min — ABNORMAL LOW (ref >60)
GFR, EST AFRICAN AMERICAN: 47 mL/min — AB (ref >60)
GLUCOSE: 125 mg/dL — AB (ref 65–99)
GLUCOSE: 129 mg/dL — AB (ref 65–99)
GLUCOSE: 89 mg/dL (ref 65–99)
GLUCOSE: 78 mg/dL (ref 65–99)
PHOSPHORUS: 3.1 mg/dL (ref 2.5–4.6)
POTASSIUM: 4 mmol/L (ref 3.5–5.1)
Phosphorus: 3 mg/dL (ref 2.5–4.6)
Phosphorus: 2 mg/dL — ABNORMAL LOW (ref 2.5–4.6)
Phosphorus: 1.8 mg/dL — ABNORMAL LOW (ref 2.5–4.6)
Potassium: 4 mmol/L (ref 3.5–5.1)
Potassium: 4 mmol/L (ref 3.5–5.1)
Potassium: 3.5 mmol/L (ref 3.5–5.1)
SODIUM: 137 mmol/L (ref 135–145)
SODIUM: 138 mmol/L (ref 135–145)
Sodium: 136 mmol/L (ref 135–145)
Sodium: 137 mmol/L (ref 135–145)

## 2017-11-06 LAB — TYPE AND SCREEN
ABO/RH(D): A POS
Antibody Screen: NEGATIVE
Unit division: 0
Unit division: 0
Unit division: 0

## 2017-11-06 LAB — CBC WITH DIFFERENTIAL/PLATELET
BASOS ABS: 0 10*3/uL (ref 0–0.1)
BLASTS: 0 %
Band Neutrophils: 4 %
Basophils Relative: 0 %
EOS PCT: 0 %
Eosinophils Absolute: 0 10*3/uL (ref 0–0.7)
HCT: 23.3 % — ABNORMAL LOW (ref 40.0–52.0)
Hemoglobin: 7.8 g/dL — ABNORMAL LOW (ref 13.0–18.0)
Lymphocytes Relative: 9 %
Lymphs Abs: 1.7 10*3/uL (ref 1.0–3.6)
MCH: 30.7 pg (ref 26.0–34.0)
MCHC: 33.6 g/dL (ref 32.0–36.0)
MCV: 91.5 fL (ref 80.0–100.0)
MONOS PCT: 4 %
Metamyelocytes Relative: 9 %
Monocytes Absolute: 0.8 10*3/uL (ref 0.2–1.0)
Myelocytes: 3 %
NEUTROS ABS: 16.4 10*3/uL — AB (ref 1.4–6.5)
NEUTROS PCT: 71 %
NRBC: 0 /100{WBCs}
Other: 0 %
Platelets: 152 10*3/uL (ref 150–440)
Promyelocytes Absolute: 0 %
RBC: 2.54 MIL/uL — AB (ref 4.40–5.90)
RDW: 13.1 % (ref 11.5–14.5)
WBC: 18.9 10*3/uL — ABNORMAL HIGH (ref 3.8–10.6)

## 2017-11-06 LAB — BPAM RBC
BLOOD PRODUCT EXPIRATION DATE: 201903262359
BLOOD PRODUCT EXPIRATION DATE: 201903272359
Blood Product Expiration Date: 201903262359
ISSUE DATE / TIME: 201903131350
ISSUE DATE / TIME: 201903130911
UNIT TYPE AND RH: 6200
UNIT TYPE AND RH: 6200
Unit Type and Rh: 6200

## 2017-11-06 LAB — TRIGLYCERIDES: TRIGLYCERIDES: 259 mg/dL — AB (ref <150)

## 2017-11-06 LAB — CK
Total CK: 13244 U/L — ABNORMAL HIGH (ref 49–397)
Total CK: 7941 U/L — ABNORMAL HIGH (ref 49–397)

## 2017-11-06 LAB — MAGNESIUM
MAGNESIUM: 1.8 mg/dL (ref 1.7–2.4)
Magnesium: 1.7 mg/dL (ref 1.7–2.4)

## 2017-11-06 LAB — PREPARE RBC (CROSSMATCH)

## 2017-11-06 LAB — SURGICAL PATHOLOGY

## 2017-11-06 MED ORDER — LORAZEPAM 2 MG/ML IJ SOLN
1.0000 mg | INTRAMUSCULAR | Status: DC | PRN
  Administered 2017-11-06: 1 mg via INTRAVENOUS
  Filled 2017-11-06: qty 1

## 2017-11-06 MED ORDER — NYSTATIN 100000 UNIT/GM EX CREA
TOPICAL_CREAM | Freq: Three times a day (TID) | CUTANEOUS | Status: DC | PRN
  Filled 2017-11-06 (×2): qty 15

## 2017-11-06 MED ORDER — DEXTROSE 5 % IV SOLN
INTRAVENOUS | Status: DC
  Administered 2017-11-06: 21:00:00 via INTRAVENOUS

## 2017-11-06 MED ORDER — DEXMEDETOMIDINE HCL IN NACL 400 MCG/100ML IV SOLN
0.4000 ug/kg/h | INTRAVENOUS | Status: DC
Start: 2017-11-06 — End: 2017-11-09
  Administered 2017-11-06: 0.4 ug/kg/h via INTRAVENOUS
  Administered 2017-11-06 – 2017-11-07 (×2): 1 ug/kg/h via INTRAVENOUS
  Filled 2017-11-06 (×4): qty 100

## 2017-11-06 MED ORDER — HYDROCORTISONE NA SUCCINATE PF 100 MG IJ SOLR
25.0000 mg | Freq: Two times a day (BID) | INTRAMUSCULAR | Status: DC
  Administered 2017-11-06 – 2017-11-08 (×5): 25 mg via INTRAVENOUS
  Filled 2017-11-06 (×5): qty 2

## 2017-11-06 MED ORDER — PANTOPRAZOLE SODIUM 40 MG PO PACK: 40.0000 mg | PACK | Freq: Every day | ORAL | Status: DC

## 2017-11-06 MED ORDER — PANTOPRAZOLE SODIUM 40 MG IV SOLR
40.0000 mg | Freq: Every day | INTRAVENOUS | Status: DC
  Administered 2017-11-06 – 2017-11-08 (×3): 40 mg via INTRAVENOUS
  Filled 2017-11-06 (×3): qty 40

## 2017-11-06 MED ORDER — LORAZEPAM 2 MG/ML IJ SOLN
1.0000 mg | INTRAMUSCULAR | Status: DC
  Administered 2017-11-06 (×2): 1 mg via INTRAVENOUS
  Filled 2017-11-06 (×2): qty 1

## 2017-11-06 NOTE — Progress Notes (Signed)
Pt agitated and tearful, attempting to pull at foley, dialysis catheter and central line.  Explained what each one was for, and reoriented patient at this time.  With no success.  Ativan given as ordered.  See MAr.

## 2017-11-06 NOTE — Progress Notes (Signed)
[employer] [facility] -  at [facility]   PATIENT NAME: Joshua PlanasMark Tonelli    MR#:  [id number]  DATE OF BIRTH:  09/09/1990  SUBJECTIVE:  CHIEF COMPLAINT:   Chief Complaint  Patient presents with  . Drug Overdose   - remains intubated, on CRRT -s/p right leg disarticulated at the knee joint.  - stable, no changes today  REVIEW OF SYSTEMS:  Review of Systems  Unable to perform ROS: Critical illness    DRUG ALLERGIES:   No Known Allergies  VITALS:  Blood pressure 117/66, pulse 68, temperature (!) 96.8 F (36 C), resp. rate 18, height 5\' 10"  (1.778 m), weight 81.6 kg (179 lb 14.3 oz), SpO2 100 %.  PHYSICAL EXAMINATION:  Physical Exam  GENERAL:  27 y.o.-year-old patient lying in the bed, critically ill appearing EYES: Pupils equal, round, dilated, reactive to light and accommodation. No scleral icterus. Extraocular muscles intact. Swollen eyelids noted HEENT: Head atraumatic, normocephalic. Oropharynx and nasopharynx clear.  NECK:  Supple, no jugular venous distention. No thyroid enlargement, no tenderness.  LUNGS: Normal breath sounds bilaterally, no wheezing, rales,rhonchi or crepitation. No use of accessory muscles of respiration.  CARDIOVASCULAR: S1, S2 normal. No murmurs, rubs, or gallops.  ABDOMEN: Soft, nontender, nondistended. Bowel sounds present. No organomegaly or mass.  EXTREMITIES:  Right leg disarticulated at the knee- dressing with minimal blood only. NEUROLOGIC: Sedated.  PSYCHIATRIC: The patient is sedated. Non-purposeful movements at times SKIN: No obvious rash, lesion, or ulcer.    LABORATORY PANEL:   CBC Recent Labs  Lab 11/06/17 0954  WBC 18.9*  HGB 7.8*  HCT 23.3*  PLT 152   ------------------------------------------------------------------------------------------------------------------  Chemistries  Recent Labs  Lab 11/04/17 0102  11/06/17 0954  NA  --    < > 136  K  --    < > 4.0  CL  --    < > 102  CO2  --    < > 24    GLUCOSE  --    < > 129*  BUN  --    < > 30*  CREATININE  --    < > 2.04*  CALCIUM  --    < > 8.0*  MG  --    < > 1.8  AST 1,253*  --   --   ALT 485*  --   --   ALKPHOS 73  --   --   BILITOT 0.7  --   --    < > = values in this interval not displayed.   ------------------------------------------------------------------------------------------------------------------  Cardiac Enzymes Recent Labs  Lab 11/02/17 0527  TROPONINI 2.43*   ------------------------------------------------------------------------------------------------------------------  RADIOLOGY:  Dg Chest Port 1 View  Result Date: 11/06/2017 CLINICAL DATA:  Mechanical assisted ventilation EXAM: PORTABLE CHEST 1 VIEW COMPARISON:  Two days ago FINDINGS: Central lines with tips at the upper cavoatrial junction. An orogastric tube reaches the stomach. Endotracheal tube tip between the clavicular heads and carina. Artifact from EKG leads. Normal heart size. Haziness of the right more than left lower chest that is unchanged. No Kerley lines or visible pneumothorax. IMPRESSION: 1. Stable positioning of tubes and lines. 2. Stable hazy appearance of the lower chest, usually atelectasis or pleural fluid. Electronically Signed   By: Marnee SpringJonathon  Watts M.D.   On: 11/06/2017 09:44    EKG:   Orders placed or performed during the hospital encounter of 10/30/17  . EKG 12-Lead  . EKG 12-Lead  . EKG 12-Lead  . EKG 12-Lead  . EKG  12-Lead  . EKG 12-Lead  . EKG 12-Lead  . EKG 12-Lead    ASSESSMENT AND PLAN:   27 year old male with known history of asthma, tobacco abuse presents to hospital secondary to an unresponsive state.  1. Acute cardio-respiratory failure-concern for drug overdose. - cardiac arrhythmia from electrolyte abnormalities likely  - decreased EF from stunned myocardium- repeat ECHO again after recovery -Intubated, on ventilator. -On sedation  -Appreciate pulmonary intensive care consult and neurology consult -  remains critically ill - plan to wean sedation and assess neurological status today  2. Acute renal failure and hyperkalemia-secondary to rhabdomyolysis. -Appreciate nephrology consult.  -on CRRT, remains oliguric -even if he recovers, will need dialysis at this time  3. Rhabdomyolysis-possible ischemic right leg with compartment syndrome. -Appreciate vascular consult.  -CPK is slowly improving -s/p right leg disarticulation at the knee on 11/03/17  4. Sepsis-elevated pro calcitonin, WBC and lactic acid - Finished course with  Unasyn -vancomycin has been discontinued - on stress dose steroids  5. DIC- low plts, active bleeding, elevated fibrin derivatives -no active bleeding at this time. Platelets are improving.  -  Received some blood transfusion during this admission. Hemoglobin is at 7.8  6. Polysubstance abuse-will be addressed after extubation. Will need psych consult as well.  6. DVT prophylaxis - Ted's and SCDs  Patient is critically ill. Cousin at bedside and updated    All the records are reviewed and case discussed with Care Management/Social Workerr. Management plans discussed with the patient, family and they are in agreement.  CODE STATUS: Partial code  TOTAL TIME TAKING CARE OF THIS PATIENT: 34 minutes.   POSSIBLE D/C IN ? DAYS, DEPENDING ON CLINICAL CONDITION.   Enid Baas M.D on 11/06/2017 at 10:51 AM  Between 7am to 6pm - Pager - [phone]  After 6pm go to [email or web address] - password [email or web address]  [facility]  Office  [phone]  CC: Primary care physician; Patient, No Pcp Per

## 2017-11-06 NOTE — Progress Notes (Signed)
Pts breathing appears easy.  Resp even and unlabored.  He intermittently will startle himself awake and say "i've gotta get out of here."  Informed patient that he will be staying a few days.  He seems satisfied with this each time and goes back to sleep.

## 2017-11-06 NOTE — Progress Notes (Signed)
[facility]  ROUNDING NOTE   Subjective:   CRRT UF of 73m/hr. Total UF of 9167m   Off vasopressors.   D10W at 5066mr   CK [address]  Objective:  Vital signs in last 24 hours:  Temp:  [96.8 F (36 C)-98.4 F (36.9 C)] 96.8 F (36 C) (03/15 0900) Pulse Rate:  [64-79] 68 (03/15 0900) Resp:  [18] 18 (03/15 0900) BP: (97-129)/(53-70) 117/66 (03/15 0900) SpO2:  [96 %-100 %] 100 % (03/15 0900) FiO2 (%):  [30 %] 30 % (03/15 0727) Weight:  [81.6 kg (179 lb 14.3 oz)] 81.6 kg (179 lb 14.3 oz) (03/15 0351)  Weight change: 1.4 kg (3 lb 1.4 oz) Filed Weights   11/04/17 0500 11/05/17 0500 11/06/17 0351  Weight: 81.1 kg (178 lb 12.7 oz) 80.2 kg (176 lb 12.9 oz) 81.6 kg (179 lb 14.3 oz)    Intake/Output: I/O last 3 completed shifts: In: 1746.5 [I.V.:1487.2; NG/GT:109.3; IV Piggyback:150] Out: 1148315rine:10; Emesis/NG output:100; Other:915; Stool:120]   Intake/Output this shift:  No intake/output data recorded.  Physical Exam: General: Critically ill  Head: ETT  Eyes:  PERRL  Neck: Right dialysis catheter, Left TLC  Lungs:  PRVC 30%  Heart: Regular rate and rhythm  Abdomen:  Soft, nontender,   Extremities: Right amputation, left with cyanosis, 1+ dorsalis pedis pulse  Neurologic: Intubated, sedated  Skin: Mottled  lower extremities  Access: RIJ temp HD catheter 3/8 Dr. ConJefferson Fuel Basic Metabolic Panel: Recent Labs  Lab 11/03/17 2051  11/04/17 [id number]/19 2100 11/05/17 0250 11/05/17 0946 11/05/17 2136 11/05/17 2139 11/06/17 0221  NA 139   < > 137   < > 139 139 140 136  --  137  K 3.9   < > 4.3   < > 3.7 3.9 4.4 4.5  --  4.0  CL 104   < > 104   < > 106 106 106 103  --  103  CO2 25   < > 26   < > '24 25 25 24  ' --  28  GLUCOSE 138*   < > 149*   < > 90 96 92 118*  --  125*  BUN 30*   < > 32*   < > 29* 27* 26* 28*  --  30*  CREATININE 2.27*   < > 2.40*   < > 2.25* 2.28* 2.25* 2.24*  --  2.19*  CALCIUM 7.8*   < > 7.3*   < > 7.9* 7.6* 7.6* 7.9*  --  7.9*   MG 1.8  --  1.8  --  1.7  --  1.6*  --  2.0  --   PHOS 3.2   < > 3.7   < > 1.8* 1.7* 4.1 3.4  --  3.0   < > = values in this interval not displayed.    Liver Function Tests: Recent Labs  Lab 10/30/17 1204 10/30/17 2132 10/31/17 0811  11/01/17 0549  11/04/17 0102  11/04/17 2100 11/05/17 0250 11/05/17 0946 11/05/17 2136 11/06/17 0221  AST 316* 1,027* 1,662*  --  2,233*  --  1,253*  --   --   --   --   --   --   ALT 233* 474* 659*  --  756*  --  485*  --   --   --   --   --   --   ALKPHOS 84 44 39  --  46  --  73  --   --   --   --   --   --  BILITOT 1.4* 1.8* 1.4*  --  1.2  --  0.7  --   --   --   --   --   --   PROT 8.1 3.8* 4.9*  --  4.8*  --  5.5*  --   --   --   --   --   --   ALBUMIN 4.9 2.2* 2.6*   < > 2.5*   < > 2.5*   < > 2.3* 2.3* 2.3* 2.3* 2.4*   < > = values in this interval not displayed.   No results for input(s): LIPASE, AMYLASE in the last 168 hours. Recent Labs  Lab 10/30/17 2132  AMMONIA 32    CBC: Recent Labs  Lab 10/30/17 1204 10/30/17 1858  11/04/17 0738 11/04/17 1212 11/04/17 1620 11/04/17 2014 11/05/17 0946  WBC 49.5* 39.5*   < > 18.5* 19.4* 14.8* 17.7* 15.9*  NEUTROABS 39.0* 37.0*  --   --   --   --  12.2* 12.9*  HGB 17.1 16.3   < > 7.0* 7.5* 8.4* 8.8* 8.1*  HCT 55.7* 50.9   < > 21.7* 22.6* 25.2* 26.4* 24.3*  MCV 100.3* 95.4   < > 94.7 92.4 92.0 91.5 90.9  PLT 282 214   < > 140* 132* 122* 130* 131*   < > = values in this interval not displayed.    Cardiac Enzymes: Recent Labs  Lab 10/30/17 2132 10/31/17 0216  10/31/17 6967  11/01/17 0549  11/02/17 0527  11/04/17 0738 11/04/17 1620 11/05/17 0453 11/05/17 1630 11/06/17 0221  CKTOTAL >50,000* >50,000*   < >  --    < > >50,000*   < > >50,000*   < > 30,625* 26,190* 21,105* 16,006* 13,244*  TROPONINI 3.31* 6.44*  --  6.06*  --  7.17*  --  2.43*  --   --   --   --   --   --    < > = values in this interval not displayed.    BNP: Invalid input(s): POCBNP  CBG: Recent Labs  Lab  11/05/17 1143 11/05/17 1650 11/05/17 1926 11/05/17 2321 11/06/17 0350  GLUCAP 76 72 103* 124* 110*    Microbiology: Results for orders placed or performed during the hospital encounter of 10/30/17  MRSA PCR Screening     Status: None   Collection Time: 10/30/17  7:54 PM  Result Value Ref Range Status   MRSA by PCR NEGATIVE NEGATIVE Final    Comment:        The GeneXpert MRSA Assay (FDA approved for NASAL specimens only), is one component of a comprehensive MRSA colonization surveillance program. It is not intended to diagnose MRSA infection nor to guide or monitor treatment for MRSA infections. Performed at [facility], [address] [place], [place] [address]   Culture, respiratory (NON-Expectorated)     Status: None (Preliminary result)   Collection Time: 11/05/17  8:17 AM  Result Value Ref Range Status   Specimen Description   Final    TRACHEAL ASPIRATE Performed at [facility], [address], [place], [place] [address]    Special Requests   Final    NONE Performed at [facility], [address] [place], [place] [address]    Gram Stain   Final    FEW WBC PRESENT, PREDOMINANTLY PMN MODERATE GRAM NEGATIVE RODS RARE YEAST Performed at [facility], [place] [address] [place], [place] [address]    Culture PENDING  Incomplete  Report Status PENDING  Incomplete    Coagulation Studies: Recent Labs    11/04/17 0102  LABPROT 14.3  INR 1.12    Urinalysis: No results for input(s): COLORURINE, LABSPEC, PHURINE, GLUCOSEU, HGBUR, BILIRUBINUR, KETONESUR, PROTEINUR, UROBILINOGEN, NITRITE, LEUKOCYTESUR in the last 72 hours.  Invalid input(s): APPERANCEUR    Imaging: Dg Chest Port 1 View  Result Date: 11/06/2017 CLINICAL DATA:  Mechanical assisted ventilation EXAM: PORTABLE CHEST 1 VIEW COMPARISON:  Two days ago FINDINGS: Central lines with tips at the upper cavoatrial junction. An orogastric tube reaches the stomach.  Endotracheal tube tip between the clavicular heads and carina. Artifact from EKG leads. Normal heart size. Haziness of the right more than left lower chest that is unchanged. No Kerley lines or visible pneumothorax. IMPRESSION: 1. Stable positioning of tubes and lines. 2. Stable hazy appearance of the lower chest, usually atelectasis or pleural fluid. Electronically Signed   By: Monte Fantasia M.D.   On: 11/06/2017 09:44     Medications:   . anticoagulant sodium citrate    . dextrose 50 mL/hr at 11/05/17 1800  . feeding supplement (VITAL HIGH PROTEIN) 1,000 mL (11/05/17 1600)  . fentaNYL infusion INTRAVENOUS 300 mcg/hr (11/06/17 0533)  . propofol (DIPRIVAN) infusion 10 mcg/kg/min (11/06/17 0955)  . pureflow 5,000 each (11/05/17 2022)   . chlorhexidine gluconate (MEDLINE KIT)  15 mL Mouth Rinse BID  . feeding supplement (PRO-STAT SUGAR FREE 64)  30 mL Per Tube TID  . hydrocortisone sod succinate (SOLU-CORTEF) inj  25 mg Intravenous Q12H  . mouth rinse  15 mL Mouth Rinse 10 times per day  . pantoprazole sodium  40 mg Per Tube QHS  . sodium chloride flush  10-40 mL Intracatheter Q12H   anticoagulant sodium citrate, bisacodyl, dextrose, docusate sodium, fentaNYL, heparin, LORazepam, nystatin cream, sennosides, sodium chloride flush  Assessment/ Plan:  [name] is a 27 y.o. white male with asthma, bronchitis, polysubstance abuse, was admitted on3/8/2019after he was found down in a hotel for an unknown period of time in the fetal position. Hospital course is complicated by prolonged CPR for 45 minutes in the emergency room. Ventricular fibrillation arrest, hypothermia protocol.  Urine tox screen positive for cocaine and opiates.  Hospital course complicated by acute respiratory failure and acute renal failure  1.  Acute renal failure: anuric. Requiring renal replacement therapy. Continuing to get renal injury from hypotension and rhabdomyolysis.  - Continue CRRT.  - Increase  ultrafiltation 154m/hr  2.  Rhabdomyolysis: cocaine induced rhabdomyolysis. CPK trending down to 132[address]   3.  Acute respiratory failure requiring ventilator support  4.  Shock- elevated LFTs, Troponin. Cardiogenic and septic.   - Completed course unasyn  - off vasopressors.   5. Right lower extremity ischemia: status post disarticulation by Dr. SDelana Meyeron 3/12   LOS: 7 Cain Fitzhenry 3/15/201910:27 AM

## 2017-11-06 NOTE — Progress Notes (Signed)
Pt self extubated after pausing Prop and placing him on pressure support.  MD conforti at bedside.  ET tube discontinued.  [place] in place at 4lpm. PT appears to be tolerating well.

## 2017-11-06 NOTE — Progress Notes (Signed)
PHARMACIST - PHYSICIAN COMMUNICATION  CONCERNING: IV to Oral Route Change Policy  RECOMMENDATION: This patient is receiving pantoprazole by the intravenous route.  Based on criteria approved by the Pharmacy and Therapeutics Committee, the intravenous medication(s) is/are being converted to the equivalent oral dose form(s).   DESCRIPTION: These criteria include:  The patient is eating (either orally or via tube) and/or has been taking other orally administered medications for a least 24 hours  The patient has no evidence of active gastrointestinal bleeding or impaired GI absorption (gastrectomy, short bowel, patient on TNA or NPO).  If you have questions about this conversion, please contact the Pharmacy Department    [phone] )  [facility] Hawking   [phone] )  [facility]   [phone] )  [facility]   [phone] )  [facility]   [phone] )  [facility]   Simpson,Michael L, [facility] 11/06/2017 9:58 AM

## 2017-11-06 NOTE — Progress Notes (Signed)
[facility]  Daily Progress Note   Subjective  - 2 Days Post-Op  Patient now extubated.  Asking questions   Dressing remains intact   Objective Vitals:   11/06/17 1000 11/06/17 1100 11/06/17 1200 11/06/17 1300  BP: 116/68 (!) 151/80 116/63 (!) 103/58  Pulse: 61 (!) 111 80 76  Resp: 18 18 (!) 21 (!) 22  Temp: (!) 96.4 F (35.8 C) (!) 97.3 F (36.3 C) 98.4 F (36.9 C) 98.1 F (36.7 C)  TempSrc:      SpO2: 100% 95% 100% 94%  Weight:      Height:        Intake/Output Summary (Last 24 hours) at 11/06/2017 1649 Last data filed at 11/06/2017 1300 Gross per 24 hour  Intake -  Output 1089 ml  Net -1089 ml  ,  PULM  intubated, no use of accessory muscles CV  No JVD, RRR Abd      No distended, nontender VASC   Right leg  Ace wrap is intact with minimal blood staining  Laboratory CBC    Component Value Date/Time   WBC 18.9 (H) 11/06/2017 0954   HGB 7.8 (L) 11/06/2017 0954   HGB 16.2 05/08/2014 2307   HCT 23.3 (L) 11/06/2017 0954   HCT 48.0 05/08/2014 2307   PLT 152 11/06/2017 0954   PLT 297 05/08/2014 2307    BMET    Component Value Date/Time   NA 138 11/06/2017 1511   NA 141 05/08/2014 2307   K 4.0 11/06/2017 1511   K 4.0 05/08/2014 2307   CL 102 11/06/2017 1511   CL 103 05/08/2014 2307   CO2 26 11/06/2017 1511   CO2 31 05/08/2014 2307   GLUCOSE 89 11/06/2017 1511   GLUCOSE 95 05/08/2014 2307   BUN 31 (H) 11/06/2017 1511   BUN 19 (H) 05/08/2014 2307   CREATININE 2.12 (H) 11/06/2017 1511   CREATININE 1.02 05/08/2014 2307   CALCIUM 8.3 (L) 11/06/2017 1511   CALCIUM 9.6 05/08/2014 2307   GFRNONAA 41 (L) 11/06/2017 1511   GFRNONAA >60 05/08/2014 2307   GFRAA 47 (L) 11/06/2017 1511   GFRAA >60 05/08/2014 2307    Assessment/Planning: POD #3 s/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  Given he is now off the vent I will  plan to revise his right leg to an above the knee mid week  [name] Thedora Rings  11/06/2017, 4:49 PM

## 2017-11-06 NOTE — Progress Notes (Signed)
[facility] Progess Note    SYNOPSIS   [age] year old gentleman with a past medical history of polysubstance abuse, status post prolonged cardiac arrest, respiratory failure, renal failure, rhabdomyolysis, shock  ASSESSMENT/PLAN   Status post cardiac arrest.   No significant change overnight. Patient on sedation with propofol and fentanyl. Will begin to wean as tolerated.  Respiratory failure. Will wean sedation and perform spontaneous awakening and breathing trial as tolerated. Will obtain arterial blood gas, chest x-ray in morning labs  Shock. Patient has been adequately volume resuscitated, transfused and now weaned off of pressors  Polysubstance abuse. Known history of drug abuse. Per friends and family he snorts his drugs and are not taken IV area urine drug screen was positive for cocaine and opiates  Renal failure.  On CRRT  Rhabdomyolysis. Status post amputation and disarticulation. [place] decreased [place] [address]  Leukocytosis. Elevated white count on broad-spectrum antibiotic coverage.  Patient is presently on Unasyn and vancomycin  Thrombocytopenia. Pending CBC from this morning platelets last night was 131. Hit panel was negative  Critical care time 40 minutes  VENTILATOR SETTINGS: Vent Mode: PRVC FiO2 (%):  [30 %] 30 % Set Rate:  [18 bmp] 18 bmp Vt Set:  [500 mL] 500 mL PEEP:  [5 cmH20] 5 cmH20 Plateau Pressure:  [14 cmH20-16 cmH20] 16 cmH20  HEMODYNAMICS: CVP:  [14 mmHg-24 mmHg] 14 mmHg  INTAKE / OUTPUT:  Intake/Output Summary (Last 24 hours) at 11/06/2017 0832 Last data filed at 11/06/2017 0600 Gross per 24 hour  Intake 760.42 ml  Output 1135 ml  Net -374.58 ml    Name: Joshua [name] MRN: [id number] DOB: 11/06/1990    ADMISSION DATE:  10/30/2017  SUBJECTIVE:   Status post disarticulation secondary to persistent rhabdomyolysis and hypotension. Patient has had difficulty evening with recurrent bleeding. Received platelets prior to  procedure, INR has been checked along with CBC pending additional unit of packed red blood cells. This morning patient's pulse became significantly bradycardic and hypotensive. Given atropine and restarted back on norepinephrine. CRRT held secondary to hypotension. Discussed with mother.  VITAL SIGNS: Temp:  [97.7 F (36.5 C)-98.4 F (36.9 C)] 98.4 F (36.9 C) (03/14 1800) Pulse Rate:  [72-79] 79 (03/14 1800) Resp:  [18] 18 (03/14 1800) BP: (94-121)/(53-70) 117/64 (03/14 1800) SpO2:  [97 %-100 %] 100 % (03/15 0302) FiO2 (%):  [30 %] 30 % (03/15 0302) Weight:  [81.6 kg (179 lb 14.3 oz)] 81.6 kg (179 lb 14.3 oz) (03/15 0351)  PHYSICAL EXAMINATION: Physical Examination:   VS: BP 117/64   Pulse 79   Temp 98.4 F (36.9 C)   Resp 18   Ht 5\' 10"  (1.778 m)   Wt 81.6 kg (179 lb 14.3 oz)   SpO2 100%   BMI 25.81 kg/m   General Appearance: critically ill unresponsive in the intensive care unit Neuro:patient does startle when you call his name Pulmonary: coarse rhonchi appreciated CardiovascularNormal S1,S2.  No m/r/g.   Abdomen: soft exam, positive bowel sounds Extremities: Status post disarticulation/amputation with soaking through of his wound and active bleeding.  LABORATORY PANEL:   CBC Recent Labs  Lab 11/05/17 0946  WBC 15.9*  HGB 8.1*  HCT 24.3*  PLT 131*    Chemistries  Recent Labs  Lab 11/04/17 0102  11/05/17 2139 11/06/17 0221  NA  --    < >  --  137  K  --    < >  --  4.0  CL  --    < >  --  103  CO2  --    < >  --  28  GLUCOSE  --    < >  --  125*  BUN  --    < >  --  30*  CREATININE  --    < >  --  2.19*  CALCIUM  --    < >  --  7.9*  MG  --    < > 2.0  --   PHOS  --    < >  --  3.0  AST 1,253*  --   --   --   ALT 485*  --   --   --   ALKPHOS 73  --   --   --   BILITOT 0.7  --   --   --    < > = values in this interval not displayed.    Recent Labs  Lab 11/05/17 0740 11/05/17 1143 11/05/17 1650 11/05/17 1926 11/05/17 2321 11/06/17 0350    GLUCAP 129* 76 72 103* 124* 110*   Recent Labs  Lab 10/30/17 2300 10/31/17 1747 11/01/17 0023  PHART 7.22* 7.40 7.48*  PCO2ART 56* 48 35  PO2ART 75* 69* 55*   Recent Labs  Lab 10/31/17 0811  11/01/17 0549  11/04/17 0102  11/05/17 0946 11/05/17 2136 11/06/17 0221  AST 1,662*  --  2,233*  --  1,253*  --   --   --   --   ALT 659*  --  756*  --  485*  --   --   --   --   ALKPHOS 39  --  46  --  73  --   --   --   --   BILITOT 1.4*  --  1.2  --  0.7  --   --   --   --   ALBUMIN 2.6*   < > 2.5*   < > 2.5*   < > 2.3* 2.3* 2.4*   < > = values in this interval not displayed.    Cardiac Enzymes Recent Labs  Lab 11/02/17 0527  TROPONINI 2.43*    RADIOLOGY:  Dg Chest Port 1 View  Result Date: 11/04/2017 CLINICAL DATA:  27 year old male found down. Suspected drug overdose. V fib arrest status post CPR and resuscitation. Acute renal failure, rhabdomyolysis, right lower extremity rhabdomyolysis and ischemia requiring amputation yesterday. EXAM: PORTABLE CHEST 1 VIEW COMPARISON:  11/03/2017 and earlier. FINDINGS: Portable AP supine view at 0930 hours. Endotracheal tube tip is stable just below the clavicles. Enteric tube courses to the abdomen, side hole up the level of the stomach. Stable dual lumen right IJ vascular catheter. Stable single lumen left IJ central line. Pacer pad projects over the lower chest. Mediastinal contours remain normal. Bilateral veiling pulmonary opacity persists, greater on the right. No superimposed pneumothorax. Pulmonary vascularity is within normal limits. No acute osseous abnormality identified. Negative visible bowel gas pattern. IMPRESSION: 1.  Stable lines and tubes. 2. Stable ventilation. Right greater than left pleural effusions with lower lobe atelectasis. Electronically Signed   By: Odessa Fleming M.D.   On: 11/04/2017 09:59    Tora Kindred, DO  11/06/2017

## 2017-11-06 NOTE — Progress Notes (Signed)
Subjective: Patient self extubated.  Awake and following commands.   Objective: Current vital signs: BP (!) 103/58   Pulse 76   Temp 98.1 F (36.7 C)   Resp (!) 22   Ht _0  (1.778 m)   Wt 81.6 kg (179 lb 14.3 oz)   SpO2 94%   BMI 25.81 kg/m  Vital signs in last 24 hours: Temp:  [96.4 F (35.8 C)-98.4 F (36.9 C)] 98.1 F (36.7 C) (03/15 1300) Pulse Rate:  [61-111] 76 (03/15 1300) Resp:  [18-22] 22 (03/15 1300) BP: (103-151)/(58-80) 103/58 (03/15 1300) SpO2:  [94 %-100 %] 94 % (03/15 1300) FiO2 (%):  [30 %] 30 % (03/15 1030) Weight:  [81.6 kg (179 lb 14.3 oz)] 81.6 kg (179 lb 14.3 oz) (03/15 0351)  Intake/Output from previous day: 03/14 0701 - 03/15 0700 In: 835.1 [I.V.:575.7; NG/GT:109.3; IV Piggyback:150] Out: 1135 [Emesis/NG output:100; Stool:120] Intake/Output this shift: Total I/O In: -  Out: 420 [Other:420] Nutritional status: Diet NPO time specified  Neurologic Exam: Mental Status: Alert, oriented to name but does not know where he is.  Follows simple commands. Speech fluent.. Cranial Nerves: II: Blinks to bilateral confrontation, pupils equal, round, reactive to light and accommodation III,IV, VI: Ptosis not present, Eyes deviated upward but with stimulation extra-ocular motions intact bilaterally V,VII: smile symmetric, facial light touch sensation normal bilaterally VIII: hearing normal bilaterally IX,X: gag reflex present XI: bilateral shoulder shrug XII: midline tongue extension Motor: Moves all extremities against gravity Sensory: Responds to light stimuli throughout   Lab Results: Basic Metabolic Panel: Recent Labs  Lab 11/04/17 0738  11/04/17 2100 11/05/17 0250 11/05/17 0946 11/05/17 2136 11/05/17 2139 11/06/17 0221 11/06/17 0954  NA 137   < > 139 139 140 136  --  137 136  K 4.3   < > 3.7 3.9 4.4 4.5  --  4.0 4.0  CL 104   < > 106 106 106 103  --  103 102  CO2 26   < > _1 --  28 24  GLUCOSE 149*   < > 90 96 92 118*  --   125* 129*  BUN 32*   < > 29* 27* 26* 28*  --  30* 30*  CREATININE 2.40*   < > 2.25* 2.28* 2.25* 2.24*  --  2.19* 2.04*  CALCIUM 7.3*   < > 7.9* 7.6* 7.6* 7.9*  --  7.9* 8.0*  MG 1.8  --  1.7  --  1.6*  --  2.0  --  1.8  PHOS 3.7   < > 1.8* 1.7* 4.1 3.4  --  3.0 3.1   < > = values in this interval not displayed.    Liver Function Tests: Recent Labs  Lab 10/30/17 2132 10/31/17 0811  11/01/17 0549  11/04/17 0102  11/05/17 0250 11/05/17 0946 11/05/17 2136 11/06/17 0221 11/06/17 0954  AST 1,027* 1,662*  --  2,233*  --  1,253*  --   --   --   --   --   --   ALT 474* 659*  --  756*  --  485*  --   --   --   --   --   --   ALKPHOS 44 39  --  46  --  73  --   --   --   --   --   --   BILITOT 1.8* 1.4*  --  1.2  --  0.7  --   --   --   --   --   --  PROT 3.8* 4.9*  --  4.8*  --  5.5*  --   --   --   --   --   --   ALBUMIN 2.2* 2.6*   < > 2.5*   < > 2.5*   < > 2.3* 2.3* 2.3* 2.4* 2.2*   < > = values in this interval not displayed.   No results for input(s): LIPASE, AMYLASE in the last 168 hours. Recent Labs  Lab 10/30/17 2132  AMMONIA 32    CBC: Recent Labs  Lab 10/30/17 1858  11/04/17 1212 11/04/17 1620 11/04/17 2014 11/05/17 0946 11/06/17 0954  WBC 39.5*   < > 19.4* 14.8* 17.7* 15.9* 18.9*  NEUTROABS 37.0*  --   --   --  12.2* 12.9* 16.4*  HGB 16.3   < > 7.5* 8.4* 8.8* 8.1* 7.8*  HCT 50.9   < > 22.6* 25.2* 26.4* 24.3* 23.3*  MCV 95.4   < > 92.4 92.0 91.5 90.9 91.5  PLT 214   < > 132* 122* 130* 131* 152   < > = values in this interval not displayed.    Cardiac Enzymes: Recent Labs  Lab 10/30/17 2132 10/31/17 0216  10/31/17 1610  11/01/17 0549  11/02/17 0527  11/04/17 0738 11/04/17 1620 11/05/17 0453 11/05/17 1630 11/06/17 0221  CKTOTAL >50,000* >50,000*   < >  --    < > >50,000*   < > >50,000*   < > 30,625* 26,190* 21,105* 16,006* 13,244*  TROPONINI 3.31* 6.44*  --  6.06*  --  7.17*  --  2.43*  --   --   --   --   --   --    < > = values in this interval  not displayed.    Lipid Panel: Recent Labs  Lab 10/31/17 2029 11/03/17 1842  TRIG 133 264*    CBG: Recent Labs  Lab 11/05/17 1926 11/05/17 2321 11/06/17 0350 11/06/17 1118 11/06/17 1120  GLUCAP 103* 124* 110* 81 37    Microbiology: Results for orders placed or performed during the hospital encounter of 10/30/17  MRSA PCR Screening     Status: None   Collection Time: 10/30/17  7:54 PM  Result Value Ref Range Status   MRSA by PCR NEGATIVE NEGATIVE Final    Comment:        The GeneXpert MRSA Assay (FDA approved for NASAL specimens only), is one component of a comprehensive MRSA colonization surveillance program. It is not intended to diagnose MRSA infection nor to guide or monitor treatment for MRSA infections. Performed at [facility], [address] [place], [place] [address]   Culture, respiratory (NON-Expectorated)     Status: None (Preliminary result)   Collection Time: 11/05/17  8:17 AM  Result Value Ref Range Status   Specimen Description   Final    TRACHEAL ASPIRATE Performed at [facility], [address], [place], [place] [address]    Special Requests   Final    NONE Performed at [facility], [address] [place], [place] [address]    Gram Stain   Final    FEW WBC PRESENT, PREDOMINANTLY PMN MODERATE GRAM NEGATIVE RODS RARE YEAST    Culture   Final    CULTURE REINCUBATED FOR BETTER GROWTH Performed at [facility], [place] [address] [place], [place] [address]    Report Status PENDING  Incomplete    Coagulation Studies: Recent Labs    11/04/17 0102  LABPROT 14.3  INR 1.12    Imaging: Dg Chest Port 1 View  Result Date: 11/06/2017 CLINICAL DATA:  Mechanical assisted ventilation EXAM: PORTABLE CHEST 1 VIEW COMPARISON:  Two days ago FINDINGS: Central lines with tips at the upper cavoatrial junction. An orogastric tube reaches the stomach. Endotracheal tube tip between the clavicular heads and carina.  Artifact from EKG leads. Normal heart size. Haziness of the right more than left lower chest that is unchanged. No Kerley lines or visible pneumothorax. IMPRESSION: 1. Stable positioning of tubes and lines. 2. Stable hazy appearance of the lower chest, usually atelectasis or pleural fluid. Electronically Signed   By: Monte Fantasia M.D.   On: 11/06/2017 09:44    Medications:  I have reviewed the patient's current medications. Scheduled: . chlorhexidine gluconate (MEDLINE KIT)  15 mL Mouth Rinse BID  . hydrocortisone sod succinate (SOLU-CORTEF) inj  25 mg Intravenous Q12H  . LORazepam  1 mg Intravenous Q4H  . mouth rinse  15 mL Mouth Rinse 10 times per day  . pantoprazole sodium  40 mg Per Tube QHS  . sodium chloride flush  10-40 mL Intracatheter Q12H    Assessment/Plan: Patient extubated.  Mental status improved despite continued sedation.  Will continue to follow with you.   LOS: 7 days   Alexis Goodell, MD Neurology [phone] 11/06/2017  3:16 PM

## 2017-11-07 LAB — GLUCOSE, CAPILLARY
GLUCOSE-CAPILLARY: 80 mg/dL (ref 65–99)
GLUCOSE-CAPILLARY: 93 mg/dL (ref 65–99)
Glucose-Capillary: 92 mg/dL (ref 65–99)
Glucose-Capillary: 85 mg/dL (ref 65–99)
Glucose-Capillary: 76 mg/dL (ref 65–99)
Glucose-Capillary: 68 mg/dL (ref 65–99)

## 2017-11-07 LAB — RENAL FUNCTION PANEL
ALBUMIN: 2.2 g/dL — AB (ref 3.5–5.0)
ANION GAP: 6 (ref 5–15)
BUN: 30 mg/dL — AB (ref 6–20)
CALCIUM: 8.2 mg/dL — AB (ref 8.9–10.3)
CO2: 26 mmol/L (ref 22–32)
Chloride: 103 mmol/L (ref 101–111)
Creatinine, Ser: 2.26 mg/dL — ABNORMAL HIGH (ref 0.61–1.24)
GFR calc Af Amer: 44 mL/min — ABNORMAL LOW (ref >60)
GFR calc non Af Amer: 38 mL/min — ABNORMAL LOW (ref >60)
GLUCOSE: 94 mg/dL (ref 65–99)
PHOSPHORUS: 1.9 mg/dL — AB (ref 2.5–4.6)
Potassium: 3.9 mmol/L (ref 3.5–5.1)
SODIUM: 135 mmol/L (ref 135–145)

## 2017-11-07 LAB — CBC WITH DIFFERENTIAL/PLATELET
BAND NEUTROPHILS: 8 %
BASOS PCT: 0 %
Basophils Absolute: 0 10*3/uL (ref 0–0.1)
Blasts: 0 %
EOS ABS: 0.2 10*3/uL (ref 0–0.7)
EOS PCT: 1 %
HCT: 25.2 % — ABNORMAL LOW (ref 40.0–52.0)
Hemoglobin: 8.3 g/dL — ABNORMAL LOW (ref 13.0–18.0)
LYMPHS ABS: 2.7 10*3/uL (ref 1.0–3.6)
Lymphocytes Relative: 13 %
MCH: 30.2 pg (ref 26.0–34.0)
MCHC: 33 g/dL (ref 32.0–36.0)
MCV: 91.5 fL (ref 80.0–100.0)
MONO ABS: 0.6 10*3/uL (ref 0.2–1.0)
MYELOCYTES: 3 %
Metamyelocytes Relative: 4 %
Monocytes Relative: 3 %
Neutro Abs: 16.9 10*3/uL — ABNORMAL HIGH (ref 1.4–6.5)
Neutrophils Relative %: 68 %
OTHER: 0 %
PLATELETS: 155 10*3/uL (ref 150–440)
Promyelocytes Absolute: 0 %
RBC: 2.75 MIL/uL — ABNORMAL LOW (ref 4.40–5.90)
RDW: 12.7 % (ref 11.5–14.5)
WBC: 20.4 10*3/uL — ABNORMAL HIGH (ref 3.8–10.6)
nRBC: 0 /100 WBC

## 2017-11-07 LAB — CK: Total CK: 7569 U/L — ABNORMAL HIGH (ref 49–397)

## 2017-11-07 MED ORDER — LORAZEPAM 2 MG/ML IJ SOLN: 1.0000 mg | INTRAMUSCULAR | Status: DC | PRN

## 2017-11-07 MED ORDER — HYDROMORPHONE HCL 1 MG/ML IJ SOLN
INTRAMUSCULAR | Status: AC
  Filled 2017-11-07: qty 1

## 2017-11-07 MED ORDER — NICOTINE 21 MG/24HR TD PT24
21.0000 mg | MEDICATED_PATCH | Freq: Every day | TRANSDERMAL | Status: DC
  Administered 2017-11-07 – 2017-11-27 (×21): 21 mg via TRANSDERMAL
  Filled 2017-11-07 (×22): qty 1

## 2017-11-07 MED ORDER — HYDROMORPHONE HCL 1 MG/ML IJ SOLN
1.0000 mg | INTRAMUSCULAR | Status: DC | PRN
  Administered 2017-11-07 – 2017-11-08 (×5): 1 mg via INTRAVENOUS
  Filled 2017-11-07 (×4): qty 1

## 2017-11-07 MED ORDER — LORAZEPAM 2 MG/ML IJ SOLN
1.0000 mg | INTRAMUSCULAR | Status: DC | PRN
  Administered 2017-11-07 – 2017-11-08 (×7): 2 mg via INTRAVENOUS
  Filled 2017-11-07 (×7): qty 1

## 2017-11-07 NOTE — Progress Notes (Signed)
Pt care assumed, NAD noted.   

## 2017-11-07 NOTE — Progress Notes (Signed)
Hd completed 

## 2017-11-07 NOTE — Progress Notes (Signed)
Pt very agitated.  Pt has been repositioned multiple times, fentanyl drip and precedex drip remain infusing, ativan was given as ordered with still no relief.  Pt attempted to pull out dialysis catheter and central line stating that "it's irritating me!"  Mitts were applied for pt safety.  Pt agreed to wear them.

## 2017-11-07 NOTE — Progress Notes (Signed)
[facility]  ROUNDING NOTE   Subjective:   Off CRRT since 6am.  Off vasopressors  Self extubated yesterday. Requires sedation due to agitation.   Anuric.   Objective:  Vital signs in last 24 hours:  Temp:  [95.9 F (35.5 C)-98.4 F (36.9 C)] 97.3 F (36.3 C) (03/16 0600) Pulse Rate:  [68-111] 70 (03/16 0600) Resp:  [16-24] 18 (03/16 0600) BP: (103-151)/(58-86) 126/71 (03/16 0600) SpO2:  [91 %-100 %] 97 % (03/16 0600) FiO2 (%):  [30 %] 30 % (03/15 1030) Weight:  [81.7 kg (180 lb 1.9 oz)] 81.7 kg (180 lb 1.9 oz) (03/16 0400)  Weight change: 0.1 kg (3.5 oz) Filed Weights   11/05/17 0500 11/06/17 0351 11/07/17 0400  Weight: 80.2 kg (176 lb 12.9 oz) 81.6 kg (179 lb 14.3 oz) 81.7 kg (180 lb 1.9 oz)    Intake/Output: I/O last 3 completed shifts: In: 2066.1 [I.V.:1306.1; NG/GT:760] Out: 2771 [Urine:25; OQHUT:6546; Stool:200]   Intake/Output this shift:  No intake/output data recorded.  Physical Exam: General: Ill appearing  Head: [place]/AT  Eyes:  PERRL  Neck: Right dialysis catheter, Left TLC  Lungs:  Bilateral crackles  Heart: tachycardia  Abdomen:  Soft, nontender  Extremities: Right amputation, left with edema  Neurologic: Confused   Skin: Mottled  Left skin  Access: RIJ temp HD catheter 3/8 Dr. Jefferson Fuel    Basic Metabolic Panel: Recent Labs  Lab 11/04/17 2100  11/05/17 0946  11/05/17 2139 11/06/17 0221 11/06/17 0954 11/06/17 1511 11/06/17 2050 11/07/17 0351  NA 139   < > 140   < >  --  137 136 138 137 135  K 3.7   < > 4.4   < >  --  4.0 4.0 4.0 3.5 3.9  CL 106   < > 106   < >  --  103 102 102 105 103  CO2 24   < > 25   < >  --  _0 GLUCOSE 90   < > 92   < >  --  125* 129* 89 78 94  BUN 29*   < > 26*   < >  --  30* 30* 31* 29* 30*  CREATININE 2.25*   < > 2.25*   < >  --  2.19* 2.04* 2.12* 2.03* 2.26*  CALCIUM 7.9*   < > 7.6*   < >  --  7.9* 8.0* 8.3* 8.0* 8.2*  MG 1.7  --  1.6*  --  2.0  --  1.8  --  1.7  --   PHOS 1.8*   < >  4.1   < >  --  3.0 3.1 2.0* 1.8* 1.9*   < > = values in this interval not displayed.    Liver Function Tests: Recent Labs  Lab 11/01/17 0549  11/04/17 0102  11/06/17 0221 11/06/17 0954 11/06/17 1511 11/06/17 2050 11/07/17 0351  AST 2,233*  --  1,253*  --   --   --   --   --   --   ALT 756*  --  485*  --   --   --   --   --   --   ALKPHOS 46  --  73  --   --   --   --   --   --   BILITOT 1.2  --  0.7  --   --   --   --   --   --  PROT 4.8*  --  5.5*  --   --   --   --   --   --   ALBUMIN 2.5*   < > 2.5*   < > 2.4* 2.2* 2.5* 2.1* 2.2*   < > = values in this interval not displayed.   No results for input(s): LIPASE, AMYLASE in the last 168 hours. No results for input(s): AMMONIA in the last 168 hours.  CBC: Recent Labs  Lab 11/04/17 1620 11/04/17 2014 11/05/17 0946 11/06/17 0954 11/07/17 0351  WBC 14.8* 17.7* 15.9* 18.9* 20.4*  NEUTROABS  --  12.2* 12.9* 16.4* 16.9*  HGB 8.4* 8.8* 8.1* 7.8* 8.3*  HCT 25.2* 26.4* 24.3* 23.3* 25.2*  MCV 92.0 91.5 90.9 91.5 91.5  PLT 122* 130* 131* 152 155    Cardiac Enzymes: Recent Labs  Lab 11/01/17 0549  11/02/17 0527  11/05/17 0453 11/05/17 1630 11/06/17 0221 11/06/17 2050 11/07/17 0351  CKTOTAL >50,000*   < > >50,000*   < > 21,105* 16,006* 13,244* 7,941* 7,569*  TROPONINI 7.17*  --  2.43*  --   --   --   --   --   --    < > = values in this interval not displayed.    BNP: Invalid input(s): POCBNP  CBG: Recent Labs  Lab 11/06/17 1712 11/06/17 1929 11/07/17 0050 11/07/17 0440 11/07/17 0725  GLUCAP 80 75 93 92 85    Microbiology: Results for orders placed or performed during the hospital encounter of 10/30/17  MRSA PCR Screening     Status: None   Collection Time: 10/30/17  7:54 PM  Result Value Ref Range Status   MRSA by PCR NEGATIVE NEGATIVE Final    Comment:        The GeneXpert MRSA Assay (FDA approved for NASAL specimens only), is one component of a comprehensive MRSA colonization surveillance  program. It is not intended to diagnose MRSA infection nor to guide or monitor treatment for MRSA infections. Performed at [facility], [address] [place], [place] [address]   Culture, respiratory (NON-Expectorated)     Status: None (Preliminary result)   Collection Time: 11/05/17  8:17 AM  Result Value Ref Range Status   Specimen Description   Final    TRACHEAL ASPIRATE Performed at [facility], [address] [place], [place] [address]    Special Requests   Final    NONE Performed at [facility], [address] [place], [place] [address]    Gram Stain   Final    FEW WBC PRESENT, PREDOMINANTLY PMN MODERATE GRAM NEGATIVE RODS RARE YEAST    Culture   Final    CULTURE REINCUBATED FOR BETTER GROWTH Performed at [facility], [place] [address], [place],  [address]    Report Status PENDING  Incomplete    Coagulation Studies: No results for input(s): LABPROT, INR in the last 72 hours.  Urinalysis: No results for input(s): COLORURINE, LABSPEC, PHURINE, GLUCOSEU, HGBUR, BILIRUBINUR, KETONESUR, PROTEINUR, UROBILINOGEN, NITRITE, LEUKOCYTESUR in the last 72 hours.  Invalid input(s): APPERANCEUR    Imaging: Dg Chest Port 1 View  Result Date: 11/06/2017 CLINICAL DATA:  Mechanical assisted ventilation EXAM: PORTABLE CHEST 1 VIEW COMPARISON:  Two days ago FINDINGS: Central lines with tips at the upper cavoatrial junction. An orogastric tube reaches the stomach. Endotracheal tube tip between the clavicular heads and carina. Artifact from EKG leads. Normal heart size. Haziness of the right more than left lower chest that is unchanged.  No Kerley lines or visible pneumothorax. IMPRESSION: 1. Stable positioning of tubes and lines. 2. Stable hazy appearance of the lower chest, usually atelectasis or pleural fluid. Electronically Signed   By: Monte Fantasia M.D.   On: 11/06/2017 09:44     Medications:   . anticoagulant sodium citrate    .  dexmedetomidine (PRECEDEX) IV infusion Stopped (11/07/17 0542)  . dextrose 75 mL/hr at 11/06/17 2126  . fentaNYL infusion INTRAVENOUS 300 mcg/hr (11/07/17 0133)  . pureflow Stopped (11/07/17 0600)   . chlorhexidine gluconate (MEDLINE KIT)  15 mL Mouth Rinse BID  . hydrocortisone sod succinate (SOLU-CORTEF) inj  25 mg Intravenous Q12H  . mouth rinse  15 mL Mouth Rinse 10 times per day  . pantoprazole (PROTONIX) IV  40 mg Intravenous QHS  . sodium chloride flush  10-40 mL Intracatheter Q12H   anticoagulant sodium citrate, bisacodyl, dextrose, docusate sodium, fentaNYL, heparin, LORazepam, nystatin cream, sennosides  Assessment/ Plan:  Mr. DMARIUS REEDER is a 27 y.o. white male with asthma, bronchitis, polysubstance abuse, was admitted on3/8/2019after he was found down in a hotel for an unknown period of time in the fetal position. Hospital course is complicated by prolonged CPR for 45 minutes in the emergency room. Ventricular fibrillation arrest, hypothermia protocol.  Urine tox screen positive for cocaine and opiates.  Hospital course complicated by acute respiratory failure and acute renal failure  1.  Acute renal failure: anuric. Requiring renal replacement therapy. Continuing to get renal injury from hypotension and rhabdomyolysis.  Currently with anuric.  - Transition to intermittent hemodialysis. Orders prepared. Scheduled for treatment later today.  - Discontinue D5W - Monitor for renal recovery with urine output, volume status, electrolytes and renal function.   2.  Rhabdomyolysis: cocaine induced rhabdomyolysis. CPK trending down to 7569  3.  Right lower extremity ischemia: status post disarticulation by Dr. Delana Meyer on 3/12. Appreciate vascular surgery.    LOS: 8 Malani Lees 3/16/201910:01 AM

## 2017-11-07 NOTE — Progress Notes (Signed)
POD #4  Subjective/Chief Complaint:  Somewhat confused. Denied pain in Right Leg. Called to patient bedside secondary to significant bleeding through right leg dressing.  Objective: Vital signs in last 24 hours: Temp:  [95.9 F (35.5 C)-100.6 F (38.1 C)] 100.6 F (38.1 C) (03/16 1000) Pulse Rate:  [68-114] 114 (03/16 1000) Resp:  [16-24] 24 (03/16 1000) BP: (103-136)/(58-104) 129/104 (03/16 0900) SpO2:  [91 %-100 %] 96 % (03/16 1000) Weight:  [81.7 kg (180 lb 1.9 oz)] 81.7 kg (180 lb 1.9 oz) (03/16 0400) Last BM Date: 11/05/17  Intake/Output from previous day: 03/15 0701 - 03/16 0700 In: 2066.1 [I.V.:1306.1; NG/GT:760] Out: 2242 [Urine:25; Stool:200] Intake/Output this shift: No intake/output data recorded.  General appearance: no distress Extremities: Right: Dressing removed- significant pulsatile bleeding from 2 small arteries- 2-) vicryl sutures placed-hemostasis obtained. Mild oozing from posterior muscle bed-surgicel placed. Remainder of disarticulation viable. Dry dressing placed.  Lab Results:  Recent Labs    11/06/17 0954 11/07/17 0351  WBC 18.9* 20.4*  HGB 7.8* 8.3*  HCT 23.3* 25.2*  PLT 152 155   BMET Recent Labs    11/06/17 2050 11/07/17 0351  NA 137 135  K 3.5 3.9  CL 105 103  CO2 26 26  GLUCOSE 78 94  BUN 29* 30*  CREATININE 2.03* 2.26*  CALCIUM 8.0* 8.2*   PT/INR No results for input(s): LABPROT, INR in the last 72 hours. ABG No results for input(s): PHART, HCO3 in the last 72 hours.  Invalid input(s): PCO2, PO2  Studies/Results: Dg Chest Port 1 View  Result Date: 11/06/2017 CLINICAL DATA:  Mechanical assisted ventilation EXAM: PORTABLE CHEST 1 VIEW COMPARISON:  Two days ago FINDINGS: Central lines with tips at the upper cavoatrial junction. An orogastric tube reaches the stomach. Endotracheal tube tip between the clavicular heads and carina. Artifact from EKG leads. Normal heart size. Haziness of the right more than left lower chest that  is unchanged. No Kerley lines or visible pneumothorax. IMPRESSION: 1. Stable positioning of tubes and lines. 2. Stable hazy appearance of the lower chest, usually atelectasis or pleural fluid. Electronically Signed   By: Marnee SpringJonathon  Watts M.D.   On: 11/06/2017 09:44    Anti-infectives: Anti-infectives (From admission, onward)   Start     Dose/Rate Route Frequency Ordered Stop   10/31/17 1600  vancomycin (VANCOCIN) 1,250 mg in sodium chloride 0.9 % 250 mL IVPB  Status:  Discontinued     1,250 mg 166.7 mL/hr over 90 Minutes Intravenous Every 24 hours 10/30/17 1909 10/31/17 1528   10/31/17 1600  vancomycin (VANCOCIN) IVPB 1000 mg/200 mL premix  Status:  Discontinued     1,000 mg 200 mL/hr over 60 Minutes Intravenous Every 24 hours 10/31/17 1528 11/01/17 1106   10/30/17 1800  Ampicillin-Sulbactam (UNASYN) 3 g in sodium chloride 0.9 % 100 mL IVPB  Status:  Discontinued     3 g 200 mL/hr over 30 Minutes Intravenous Every 6 hours 10/30/17 1750 11/06/17 1007   10/30/17 1800  vancomycin (VANCOCIN) IVPB 1000 mg/200 mL premix     1,000 mg 200 mL/hr over 60 Minutes Intravenous  Once 10/30/17 1750 10/30/17 2005   10/30/17 1315  piperacillin-tazobactam (ZOSYN) IVPB 3.375 g     3.375 g 100 mL/hr over 30 Minutes Intravenous  Once 10/30/17 1314 10/30/17 1348      Assessment/Plan: s/p Procedure(s): KNEE DISARTICULATION (Right) HgB stable.  Monitor dressing and right stump.  Will plan for right leg revision mid week.  LOS: 8 days  Eli Hose A 11/07/2017

## 2017-11-07 NOTE — Progress Notes (Signed)
[facility] Critical [facility] Medicine Progess Note    SYNOPSIS   27 year old gentleman with a past medical history of polysubstance abuse, status post prolonged cardiac arrest, respiratory failure, renal failure, rhabdomyolysis, shock  ASSESSMENT/PLAN   Status post cardiac arrest.   Patient was communicative after extubationbut confused and agitated.  Respiratory failure. Self extubation yesterday. Tolerated well.Presently on nasal cannula oxygen  Shock. Resolved  Polysubstance abuse. Known history of drug abuse. Per friends and family he snorts his drugs and are not taken IV area urine drug screen was positive for cocaine and opiates  Renal failure.  On CRRT. Machine clotted converting to IHD per nephrology.   Rhabdomyolysis. Status post amputation and disarticulation. CK has decreased to 7569  Leukocytosis. Completed a course of antibiotics, stopped yesterday will follow closely  Thrombocytopenia. Pending CBC from this morning platelets last night was 131. Hit panel was negative  When patient is more awake and coherent and responsive will recount the events to him on his family members present  VENTILATOR SETTINGS: Vent Mode: PSV FiO2 (%):  [30 %] 30 % PEEP:  [5 cmH20] 5 cmH20 Pressure Support:  [5 cmH20] 5 cmH20  HEMODYNAMICS: CVP:  [11 mmHg-20 mmHg] 18 mmHg  INTAKE / OUTPUT:  Intake/Output Summary (Last 24 hours) at 11/07/2017 0454 Last data filed at 11/07/2017 0600 Gross per 24 hour  Intake 2066.11 ml  Output 2194 ml  Net -127.89 ml    Name: Joshua [name] MRN: [id number] DOB: [birth year]    ADMISSION DATE:  10/30/2017  SUBJECTIVE:   Over last 24 hours patient had successful extubation. Presently on nasal cannula. He has been confused and agitated. Has been on and off of Precedex along with as needed benzo. CRRT clotted. Per nephrology converting to IHD as patient is off pressors with adequate blood pressure  VITAL SIGNS: Temp:  [95.9 F (35.5 C)-98.4 F (36.9  C)] 97.3 F (36.3 C) (03/16 0600) Pulse Rate:  [61-111] 70 (03/16 0600) Resp:  [16-24] 18 (03/16 0600) BP: (103-151)/(58-86) 126/71 (03/16 0600) SpO2:  [91 %-100 %] 97 % (03/16 0600) FiO2 (%):  [30 %] 30 % (03/15 1030) Weight:  [81.7 kg (180 lb 1.9 oz)] 81.7 kg (180 lb 1.9 oz) (03/16 0400)  PHYSICAL EXAMINATION: Physical Examination:   VS: BP 126/71   Pulse 70   Temp (!) 97.3 F (36.3 C)   Resp 18   Ht 5\' 10"  (1.778 m)   Wt 81.7 kg (180 lb 1.9 oz)   SpO2 97%   BMI 25.84 kg/m   General Appearance: critically ill sedated in the intensive care unit Neuro:patient does startle when you call his name Pulmonary: coarse rhonchi appreciated CardiovascularNormal S1,S2.  No m/r/g.   Abdomen: soft exam, positive bowel sounds Extremities: does articulation shows no evidence of bleeding,leg is bandaged and wrapped  LABORATORY PANEL:   CBC Recent Labs  Lab 11/07/17 0351  WBC 20.4*  HGB 8.3*  HCT 25.2*  PLT 155    Chemistries  Recent Labs  Lab 11/04/17 0102  11/06/17 2050 11/07/17 0351  NA  --    < > 137 135  K  --    < > 3.5 3.9  CL  --    < > 105 103  CO2  --    < > 26 26  GLUCOSE  --    < > 78 94  BUN  --    < > 29* 30*  CREATININE  --    < > 2.03* 2.26*  CALCIUM  --    < > 8.0* 8.2*  MG  --    < > 1.7  --   PHOS  --    < > 1.8* 1.9*  AST 1,253*  --   --   --   ALT 485*  --   --   --   ALKPHOS 73  --   --   --   BILITOT 0.7  --   --   --    < > = values in this interval not displayed.    Recent Labs  Lab 11/06/17 1437 11/06/17 1712 11/06/17 1929 11/07/17 0050 11/07/17 0440 11/07/17 0725  GLUCAP 79 80 75 93 92 85   Recent Labs  Lab 10/31/17 1747 11/01/17 0023  PHART 7.40 7.48*  PCO2ART 48 35  PO2ART 69* 55*   Recent Labs  Lab 10/31/17 0811  11/01/17 0549  11/04/17 0102  11/06/17 1511 11/06/17 2050 11/07/17 0351  AST 1,662*  --  2,233*  --  1,253*  --   --   --   --   ALT 659*  --  756*  --  485*  --   --   --   --   ALKPHOS 39  --  46  --   73  --   --   --   --   BILITOT 1.4*  --  1.2  --  0.7  --   --   --   --   ALBUMIN 2.6*   < > 2.5*   < > 2.5*   < > 2.5* 2.1* 2.2*   < > = values in this interval not displayed.    Cardiac Enzymes Recent Labs  Lab 11/02/17 0527  TROPONINI 2.43*    RADIOLOGY:  Dg Chest Port 1 View  Result Date: 11/06/2017 CLINICAL DATA:  Mechanical assisted ventilation EXAM: PORTABLE CHEST 1 VIEW COMPARISON:  Two days ago FINDINGS: Central lines with tips at the upper cavoatrial junction. An orogastric tube reaches the stomach. Endotracheal tube tip between the clavicular heads and carina. Artifact from EKG leads. Normal heart size. Haziness of the right more than left lower chest that is unchanged. No Kerley lines or visible pneumothorax. IMPRESSION: 1. Stable positioning of tubes and lines. 2. Stable hazy appearance of the lower chest, usually atelectasis or pleural fluid. Electronically Signed   By: Marnee SpringJonathon  Watts M.D.   On: 11/06/2017 09:44    Tora KindredJohn Liliah Dorian, DO  11/07/2017

## 2017-11-07 NOTE — Progress Notes (Signed)
[employer] [facility] [facility] [place] [facility]   PATIENT NAME: Joshua [name]    MR#:  [id number]  DATE OF BIRTH:  10/15/1990  SUBJECTIVE:  CHIEF COMPLAINT:   Chief Complaint  Patient presents with  . Drug Overdose  Nephrology, neurology, vascular surgery notes reviewed, case discussed with intensivist, continued encephalopathy  REVIEW OF SYSTEMS:  CONSTITUTIONAL: No fever, fatigue or weakness.  EYES: No blurred or double vision.  EARS, NOSE, AND THROAT: No tinnitus or ear pain.  RESPIRATORY: No cough, shortness of breath, wheezing or hemoptysis.  CARDIOVASCULAR: No chest pain, orthopnea, edema.  GASTROINTESTINAL: No nausea, vomiting, diarrhea or abdominal pain.  GENITOURINARY: No dysuria, hematuria.  ENDOCRINE: No polyuria, nocturia,  HEMATOLOGY: No anemia, easy bruising or bleeding SKIN: No rash or lesion. MUSCULOSKELETAL: No joint pain or arthritis.   NEUROLOGIC: No tingling, numbness, weakness.  PSYCHIATRY: No anxiety or depression.   ROS  DRUG ALLERGIES:  No Known Allergies  VITALS:  Blood pressure (!) 129/104, pulse (!) 114, temperature (!) 100.6 F (38.1 C), resp. rate (!) 24, height 5\' 10"  (1.778 m), weight 81.7 kg (180 lb 1.9 oz), SpO2 96 %.  PHYSICAL EXAMINATION:  GENERAL:  27 y.o.-year-old patient lying in the bed with no acute distress.  EYES: Pupils equal, round, reactive to light and accommodation. No scleral icterus. Extraocular muscles intact.  HEENT: Head atraumatic, normocephalic. Oropharynx and nasopharynx clear.  NECK:  Supple, no jugular venous distention. No thyroid enlargement, no tenderness.  LUNGS: Normal breath sounds bilaterally, no wheezing, rales,rhonchi or crepitation. No use of accessory muscles of respiration.  CARDIOVASCULAR: S1, S2 normal. No murmurs, rubs, or gallops.  ABDOMEN: Soft, nontender, nondistended. Bowel sounds present. No organomegaly or mass.  EXTREMITIES: No pedal edema, cyanosis, or clubbing.  NEUROLOGIC: Cranial nerves II  through XII are intact. Muscle strength 5/5 in all extremities. Sensation intact. Gait not checked.  PSYCHIATRIC: The patient is alert and oriented x 3.  SKIN: No obvious rash, lesion, or ulcer.   Physical Exam LABORATORY PANEL:   CBC Recent Labs  Lab 11/07/17 0351  WBC 20.4*  HGB 8.3*  HCT 25.2*  PLT 155   ------------------------------------------------------------------------------------------------------------------  Chemistries  Recent Labs  Lab 11/04/17 0102  11/06/17 2050 11/07/17 0351  NA  --    < > 137 135  K  --    < > 3.5 3.9  CL  --    < > 105 103  CO2  --    < > 26 26  GLUCOSE  --    < > 78 94  BUN  --    < > 29* 30*  CREATININE  --    < > 2.03* 2.26*  CALCIUM  --    < > 8.0* 8.2*  MG  --    < > 1.7  --   AST 1,253*  --   --   --   ALT 485*  --   --   --   ALKPHOS 73  --   --   --   BILITOT 0.7  --   --   --    < > = values in this interval not displayed.   ------------------------------------------------------------------------------------------------------------------  Cardiac Enzymes Recent Labs  Lab 11/01/17 0549 11/02/17 0527  TROPONINI 7.17* 2.43*   ------------------------------------------------------------------------------------------------------------------  RADIOLOGY:  Dg Chest Port 1 View  Result Date: 11/06/2017 CLINICAL DATA:  Mechanical assisted ventilation EXAM: PORTABLE CHEST 1 VIEW COMPARISON:  Two days ago FINDINGS: Central lines with tips at the upper  cavoatrial junction. An orogastric tube reaches the stomach. Endotracheal tube tip between the clavicular heads and carina. Artifact from EKG leads. Normal heart size. Haziness of the right more than left lower chest that is unchanged. No Kerley lines or visible pneumothorax. IMPRESSION: 1. Stable positioning of tubes and lines. 2. Stable hazy appearance of the lower chest, usually atelectasis or pleural fluid. Electronically Signed   By: Marnee Spring M.D.   On: 11/06/2017 09:44     ASSESSMENT AND PLAN:  27 year old male with known history of asthma, tobacco abuse presents to hospital secondary to an unresponsive state.  1. Acute cardiac arrest  Resolved  Concern for possible drug overdose, noted cardiac arrhythmia from electrolyte abnormalities likely   2. Acute renal failure and hyperkalemia Stable secondary to rhabdomyolysis Nephrology following, initially treated with CRRT-to change to hemodialysis given clotting episodes  3. Rhabdomyolysis, acute Secondary to possible ischemic right leg with compartment syndrome. 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 Sepsis, acute Resolved Treated with course of IV Unasyn/vancomycin  5. DIC, acute Improved Secondary to sepsis Received blood transfusion during this admission  6. Polysubstance abuse We will need counseling once sensorium improves.   All the records are reviewed and case discussed with Care Management/Social Workerr. Management plans discussed with the patient, family and they are in agreement.  CODE STATUS: full  TOTAL TIME TAKING CARE OF THIS PATIENT: 35 minutes.   POSSIBLE D/C IN 2-3 DAYS, DEPENDING ON CLINICAL CONDITION.   Evelena Asa Ellean Firman M.D on 11/07/2017   Between 7am to 6pm - Pager - [phone]  After 6pm go to [email or web address] - password [email or web address]  [facility]  Office  [phone]  CC: Primary care physician; Patient, No Pcp Per  Note: This dictation was prepared with Dragon dictation along with smaller phrase technology. Any transcriptional errors that result from this process are unintentional.

## 2017-11-07 NOTE — Progress Notes (Signed)
This note also relates to the following rows which could not be included: Pulse Rate - Cannot attach notes to unvalidated device data Resp - Cannot attach notes to unvalidated device data BP - Cannot attach notes to unvalidated device data SpO2 - Cannot attach notes to unvalidated device data  Hd started  

## 2017-11-08 DIAGNOSIS — R570 Cardiogenic shock: Secondary | ICD-10-CM

## 2017-11-08 LAB — CBC WITH DIFFERENTIAL/PLATELET
BAND NEUTROPHILS: 2 %
BASOS PCT: 0 %
Basophils Absolute: 0 10*3/uL (ref 0–0.1)
Blasts: 0 %
EOS ABS: 0.5 10*3/uL (ref 0–0.7)
EOS PCT: 2 %
HCT: 22.7 % — ABNORMAL LOW (ref 40.0–52.0)
Hemoglobin: 7.4 g/dL — ABNORMAL LOW (ref 13.0–18.0)
LYMPHS ABS: 2.2 10*3/uL (ref 1.0–3.6)
Lymphocytes Relative: 9 %
MCH: 30.1 pg (ref 26.0–34.0)
MCHC: 32.4 g/dL (ref 32.0–36.0)
MCV: 92.9 fL (ref 80.0–100.0)
MONO ABS: 2.4 10*3/uL — AB (ref 0.2–1.0)
Metamyelocytes Relative: 2 %
Monocytes Relative: 10 %
Myelocytes: 1 %
NEUTROS PCT: 74 %
NRBC: 0 /100{WBCs}
Neutro Abs: 19.2 10*3/uL — ABNORMAL HIGH (ref 1.4–6.5)
Other: 0 %
PLATELETS: 225 10*3/uL (ref 150–440)
PROMYELOCYTES ABS: 0 %
RBC: 2.44 MIL/uL — ABNORMAL LOW (ref 4.40–5.90)
RDW: 13 % (ref 11.5–14.5)
WBC: 24.3 10*3/uL — ABNORMAL HIGH (ref 3.8–10.6)

## 2017-11-08 LAB — CULTURE, RESPIRATORY W GRAM STAIN

## 2017-11-08 LAB — BASIC METABOLIC PANEL
Anion gap: 13 (ref 5–15)
BUN: 29 mg/dL — ABNORMAL HIGH (ref 6–20)
CALCIUM: 8.4 mg/dL — AB (ref 8.9–10.3)
CHLORIDE: 102 mmol/L (ref 101–111)
CO2: 23 mmol/L (ref 22–32)
CREATININE: 3.13 mg/dL — AB (ref 0.61–1.24)
GFR calc non Af Amer: 25 mL/min — ABNORMAL LOW (ref >60)
GFR, EST AFRICAN AMERICAN: 29 mL/min — AB (ref >60)
GLUCOSE: 73 mg/dL (ref 65–99)
Potassium: 3.7 mmol/L (ref 3.5–5.1)
Sodium: 138 mmol/L (ref 135–145)

## 2017-11-08 LAB — GLUCOSE, CAPILLARY
GLUCOSE-CAPILLARY: 73 mg/dL (ref 65–99)
GLUCOSE-CAPILLARY: 71 mg/dL (ref 65–99)
GLUCOSE-CAPILLARY: 101 mg/dL — AB (ref 65–99)
Glucose-Capillary: 57 mg/dL — ABNORMAL LOW (ref 65–99)
Glucose-Capillary: 78 mg/dL (ref 65–99)
Glucose-Capillary: 95 mg/dL (ref 65–99)
Glucose-Capillary: 101 mg/dL — ABNORMAL HIGH (ref 65–99)

## 2017-11-08 LAB — CULTURE, RESPIRATORY

## 2017-11-08 LAB — CK: Total CK: 7699 U/L — ABNORMAL HIGH (ref 49–397)

## 2017-11-08 MED ORDER — QUETIAPINE FUMARATE 25 MG PO TABS
25.0000 mg | ORAL_TABLET | Freq: Every day | ORAL | Status: DC
  Administered 2017-11-08 – 2017-11-22 (×15): 25 mg via ORAL
  Filled 2017-11-08 (×16): qty 1

## 2017-11-08 MED ORDER — HYDROMORPHONE HCL 1 MG/ML IJ SOLN
2.0000 mg | INTRAMUSCULAR | Status: DC | PRN
  Administered 2017-11-08 – 2017-11-09 (×8): 2 mg via INTRAVENOUS
  Filled 2017-11-08 (×9): qty 2

## 2017-11-08 NOTE — Progress Notes (Signed)
5 Days Post-Op   Subjective/Chief Complaint: Doing Ok. Continues to have pain. Some confusion persists.  Objective: Vital signs in last 24 hours: Temp:  [98.4 F (36.9 C)-100.6 F (38.1 C)] 98.6 F (37 C) (03/17 0400) Pulse Rate:  [103-137] 103 (03/17 0600) Resp:  [12-30] 22 (03/17 0600) BP: (104-155)/(50-138) 123/100 (03/17 0600) SpO2:  [94 %-100 %] 99 % (03/17 0600) Weight:  [77.9 kg (171 lb 11.8 oz)] 77.9 kg (171 lb 11.8 oz) (03/17 0412) Last BM Date: 11/05/17  Intake/Output from previous day: 03/16 0701 - 03/17 0700 In: 1717.5 [I.V.:1717.5] Out: 1500  Intake/Output this shift: No intake/output data recorded.  General appearance: mild distress Extremities: Mild bloody staining of ACE wrap. Otherwise intact.  Lab Results:  Recent Labs    11/06/17 0954 11/07/17 0351  WBC 18.9* 20.4*  HGB 7.8* 8.3*  HCT 23.3* 25.2*  PLT 152 155   BMET Recent Labs    11/07/17 0351 11/08/17 0404  NA 135 138  K 3.9 3.7  CL 103 102  CO2 26 23  GLUCOSE 94 73  BUN 30* 29*  CREATININE 2.26* 3.13*  CALCIUM 8.2* 8.4*   PT/INR No results for input(s): LABPROT, INR in the last 72 hours. ABG No results for input(s): PHART, HCO3 in the last 72 hours.  Invalid input(s): PCO2, PO2  Studies/Results: Dg Chest Port 1 View  Result Date: 11/06/2017 CLINICAL DATA:  Mechanical assisted ventilation EXAM: PORTABLE CHEST 1 VIEW COMPARISON:  Two days ago FINDINGS: Central lines with tips at the upper cavoatrial junction. An orogastric tube reaches the stomach. Endotracheal tube tip between the clavicular heads and carina. Artifact from EKG leads. Normal heart size. Haziness of the right more than left lower chest that is unchanged. No Kerley lines or visible pneumothorax. IMPRESSION: 1. Stable positioning of tubes and lines. 2. Stable hazy appearance of the lower chest, usually atelectasis or pleural fluid. Electronically Signed   By: Marnee SpringJonathon  Watts M.D.   On: 11/06/2017 09:44     Anti-infectives: Anti-infectives (From admission, onward)   Start     Dose/Rate Route Frequency Ordered Stop   10/31/17 1600  vancomycin (VANCOCIN) 1,250 mg in sodium chloride 0.9 % 250 mL IVPB  Status:  Discontinued     1,250 mg 166.7 mL/hr over 90 Minutes Intravenous Every 24 hours 10/30/17 1909 10/31/17 1528   10/31/17 1600  vancomycin (VANCOCIN) IVPB 1000 mg/200 mL premix  Status:  Discontinued     1,000 mg 200 mL/hr over 60 Minutes Intravenous Every 24 hours 10/31/17 1528 11/01/17 1106   10/30/17 1800  Ampicillin-Sulbactam (UNASYN) 3 g in sodium chloride 0.9 % 100 mL IVPB  Status:  Discontinued     3 g 200 mL/hr over 30 Minutes Intravenous Every 6 hours 10/30/17 1750 11/06/17 1007   10/30/17 1800  vancomycin (VANCOCIN) IVPB 1000 mg/200 mL premix     1,000 mg 200 mL/hr over 60 Minutes Intravenous  Once 10/30/17 1750 10/30/17 2005   10/30/17 1315  piperacillin-tazobactam (ZOSYN) IVPB 3.375 g     3.375 g 100 mL/hr over 30 Minutes Intravenous  Once 10/30/17 1314 10/30/17 1348      Assessment/Plan: s/p Procedure(s): KNEE DISARTICULATION (Right) Awaiting CBC from today- HgB has been stable. increasing WBC.  Plan for revision of right leg mid week.  LOS: 9 days    Eli Hosesco, Miechia A 11/08/2017

## 2017-11-08 NOTE — Progress Notes (Signed)
[employer] - [place] [facility]   PATIENT NAME: Joshua PlanasMark [name]    MR#:  [id number]  DATE OF BIRTH:  05/06/1991  SUBJECTIVE:  CHIEF COMPLAINT:   Chief Complaint  Patient presents with  . 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  EYES: No blurred or double vision.  EARS, NOSE, AND THROAT: No tinnitus or ear pain.  RESPIRATORY: No cough, shortness of breath, wheezing or hemoptysis.  CARDIOVASCULAR: No chest pain, orthopnea, edema.  GASTROINTESTINAL: No nausea, vomiting, diarrhea or abdominal pain.  GENITOURINARY: No dysuria, hematuria.  ENDOCRINE: No polyuria, nocturia,  HEMATOLOGY: No anemia, easy bruising or bleeding SKIN: No rash or lesion. MUSCULOSKELETAL: No joint pain or arthritis.   NEUROLOGIC: No tingling, numbness, weakness.  PSYCHIATRY: No anxiety or depression.   ROS  DRUG ALLERGIES:  No Known Allergies  VITALS:  Blood pressure (!) 123/100, pulse (!) 103, temperature 98.6 F (37 C), temperature source Oral, resp. rate (!) 22, height 5\' 10"  (1.778 m), weight 77.9 kg (171 lb 11.8 oz), SpO2 99 %.  PHYSICAL EXAMINATION:  GENERAL:  27 y.o.-year-old patient lying in the bed with no acute distress.  EYES: Pupils equal, round, reactive to light and accommodation. No scleral icterus. Extraocular muscles intact.  HEENT: Head atraumatic, normocephalic. Oropharynx and nasopharynx clear.  NECK:  Supple, no jugular venous distention. No thyroid enlargement, no tenderness.  LUNGS: Normal breath sounds bilaterally, no wheezing, rales,rhonchi or crepitation. No use of accessory muscles of respiration.  CARDIOVASCULAR: S1, S2 normal. No murmurs, rubs, or gallops.  ABDOMEN: Soft, nontender, nondistended. Bowel sounds present. No organomegaly or mass.   EXTREMITIES: No pedal edema, cyanosis, or clubbing.  NEUROLOGIC: Cranial nerves II through XII are intact. Muscle strength 5/5 in all extremities. Sensation intact. Gait not checked.  PSYCHIATRIC: The patient is alert and oriented x 3.  SKIN: No obvious rash, lesion, or ulcer.   Physical Exam LABORATORY PANEL:   CBC Recent Labs  Lab 11/07/17 0351  WBC 20.4*  HGB 8.3*  HCT 25.2*  PLT 155   ------------------------------------------------------------------------------------------------------------------  Chemistries  Recent Labs  Lab 11/04/17 0102  11/06/17 2050  11/08/17 0404  NA  --    < > 137   < > 138  K  --    < > 3.5   < > 3.7  CL  --    < > 105   < > 102  CO2  --    < > 26   < > 23  GLUCOSE  --    < > 78   < > 73  BUN  --    < > 29*   < > 29*  CREATININE  --    < > 2.03*   < > 3.13*  CALCIUM  --    < > 8.0*   < > 8.4*  MG  --    < > 1.7  --   --   AST 1,253*  --   --   --   --   ALT 485*  --   --   --   --   ALKPHOS 73  --   --   --   --   BILITOT 0.7  --   --   --   --    < > = values  in this interval not displayed.   ------------------------------------------------------------------------------------------------------------------  Cardiac Enzymes Recent Labs  Lab 11/02/17 0527  TROPONINI 2.43*   ------------------------------------------------------------------------------------------------------------------  RADIOLOGY:  No results found.  ASSESSMENT AND PLAN:  27 year old male with known history of asthma, tobacco abuse presents to hospital secondary to an unresponsive state.  1. Acute cardiac arrest  Resolved  Concern for possible drug overdose, noted cardiac arrhythmia from electrolyte abnormalities likely   2. Acute renal failure and hyperkalemia Stable secondary to rhabdomyolysis Nephrology following, initially treated with CRRT-to changed to hemodialysis given clotting episodes  3. Rhabdomyolysis, acute Resolving Secondary to  possible ischemic right leg with compartment syndrome. Vascular surgery following, s/p right leg disarticulation at the [facility] 11/03/17  4. Sepsis, acute Resolved Treated with course of IV Unasyn/vancomycin  5. DIC, acute Improved Secondary to sepsis Received blood transfusion during this admission  6. Polysubstance abuse We will need counseling once sensorium improves.  All the records are reviewed and case discussed with Care Management/Social Workerr. Management plans discussed with the patient, family and they are in agreement.  CODE STATUS: Partial code  TOTAL TIME TAKING CARE OF THIS PATIENT: 35 minutes.     POSSIBLE D/C IN 2-5 DAYS, DEPENDING ON CLINICAL CONDITION.   Evelena Asa Brittny Spangle M.D on 11/08/2017   Between 7am to 6pm - Pager - [phone]  After 6pm go to [email or web address] - password [email or web address]  [facility]  Office  [phone]  CC: Primary care physician; Patient, No Pcp Per  Note: This dictation was prepared with Dragon dictation along with smaller phrase technology. Any transcriptional errors that result from this process are unintentional.

## 2017-11-08 NOTE — Progress Notes (Signed)
[facility]  ROUNDING NOTE   Subjective:   Intermittent hemodialysis yesterday. Tolerated treatment well. UF of 1.5 liters.   On fentanyl due to agitation. Unable to give much history.   Anuric. Foley catheter removed.   Objective:  Vital signs in last 24 hours:  Temp:  [98.4 F (36.9 C)-100.6 F (38.1 C)] 98.6 F (37 C) (03/17 0400) Pulse Rate:  [103-137] 103 (03/17 0600) Resp:  [12-30] 22 (03/17 0600) BP: (104-155)/(50-138) 123/100 (03/17 0600) SpO2:  [94 %-100 %] 99 % (03/17 0600) Weight:  [77.9 kg (171 lb 11.8 oz)] 77.9 kg (171 lb 11.8 oz) (03/17 0412)  Weight change: -3.8 kg (-6 oz) Filed Weights   11/06/17 0351 11/07/17 0400 11/08/17 0412  Weight: 81.6 kg (179 lb 14.3 oz) 81.7 kg (180 lb 1.9 oz) 77.9 kg (171 lb 11.8 oz)    Intake/Output: I/O last 3 completed shifts: In: 3783.6 [I.V.:3023.6; NG/GT:760] Out: 2752 [Urine:25; WYOVZ:8588; Stool:200]   Intake/Output this shift:  No intake/output data recorded.  Physical Exam: General: Ill appearing  Head: /AT  Eyes:  PERRL  Neck: Right dialysis catheter, Left TLC  Lungs:  Bilateral crackles  Heart: tachycardia  Abdomen:  Soft, nontender  Extremities: Right amputation, left with edema  Neurologic: Confused   Skin: Mottled  Left lower extremity skin, stage 1 sacral decub  Access: RIJ temp HD catheter 3/8 Dr. Jefferson Fuel    Basic Metabolic Panel: Recent Labs  Lab 11/04/17 2100  11/05/17 0946  11/05/17 2139 11/06/17 0221 11/06/17 0954 11/06/17 1511 11/06/17 2050 11/07/17 0351 11/08/17 0404  NA 139   < > 140   < >  --  137 136 138 137 135 138  K 3.7   < > 4.4   < >  --  4.0 4.0 4.0 3.5 3.9 3.7  CL 106   < > 106   < >  --  103 102 102 105 103 102  CO2 24   < > 25   < >  --  '28 24 26 26 26 23  ' GLUCOSE 90   < > 92   < >  --  125* 129* 89 78 94 73  BUN 29*   < > 26*   < >  --  30* 30* 31* 29* 30* 29*  CREATININE 2.25*   < > 2.25*   < >  --  2.19* 2.04* 2.12* 2.03* 2.26* 3.13*  CALCIUM 7.9*   <  > 7.6*   < >  --  7.9* 8.0* 8.3* 8.0* 8.2* 8.4*  MG 1.7  --  1.6*  --  2.0  --  1.8  --  1.7  --   --   PHOS 1.8*   < > 4.1   < >  --  3.0 3.1 2.0* 1.8* 1.9*  --    < > = values in this interval not displayed.    Liver Function Tests: Recent Labs  Lab 11/04/17 0102  11/06/17 0221 11/06/17 0954 11/06/17 1511 11/06/17 2050 11/07/17 0351  AST 1,253*  --   --   --   --   --   --   ALT 485*  --   --   --   --   --   --   ALKPHOS 73  --   --   --   --   --   --   BILITOT 0.7  --   --   --   --   --   --  PROT 5.5*  --   --   --   --   --   --   ALBUMIN 2.5*   < > 2.4* 2.2* 2.5* 2.1* 2.2*   < > = values in this interval not displayed.   No results for input(s): LIPASE, AMYLASE in the last 168 hours. No results for input(s): AMMONIA in the last 168 hours.  CBC: Recent Labs  Lab 11/04/17 1620 11/04/17 2014 11/05/17 0946 11/06/17 0954 11/07/17 0351  WBC 14.8* 17.7* 15.9* 18.9* 20.4*  NEUTROABS  --  12.2* 12.9* 16.4* 16.9*  HGB 8.4* 8.8* 8.1* 7.8* 8.3*  HCT 25.2* 26.4* 24.3* 23.3* 25.2*  MCV 92.0 91.5 90.9 91.5 91.5  PLT 122* 130* 131* 152 155    Cardiac Enzymes: Recent Labs  Lab 11/02/17 0527  11/05/17 1630 11/06/17 0221 11/06/17 2050 11/07/17 0351 11/08/17 0404  CKTOTAL >50,000*   < > 16,006* 13,244* 7,941* 7,569* 7,699*  TROPONINI 2.43*  --   --   --   --   --   --    < > = values in this interval not displayed.    BNP: Invalid input(s): POCBNP  CBG: Recent Labs  Lab 11/07/17 1553 11/07/17 2042 11/07/17 2322 11/08/17 0408 11/08/17 0808  GLUCAP 76 80 68 73 81*    Microbiology: Results for orders placed or performed during the hospital encounter of 10/30/17  MRSA PCR Screening     Status: None   Collection Time: 10/30/17  7:54 PM  Result Value Ref Range Status   MRSA by PCR NEGATIVE NEGATIVE Final    Comment:        The GeneXpert MRSA Assay (FDA approved for NASAL specimens only), is one component of a comprehensive MRSA  colonization surveillance program. It is not intended to diagnose MRSA infection nor to guide or monitor treatment for MRSA infections. Performed at [facility], [address] [place], [place] [address]   Culture, respiratory (NON-Expectorated)     Status: None (Preliminary result)   Collection Time: 11/05/17  8:17 AM  Result Value Ref Range Status   Specimen Description   Final    TRACHEAL ASPIRATE Performed at [facility], [address], [place], [place] [address]    Special Requests   Final    NONE Performed at [facility], [address] [place], [place] [address]    Gram Stain   Final    FEW WBC PRESENT, PREDOMINANTLY PMN MODERATE GRAM NEGATIVE RODS RARE YEAST    Culture   Final    MODERATE GRAM NEGATIVE RODS IDENTIFICATION AND SUSCEPTIBILITIES TO FOLLOW Performed at [facility], [place] [address], [place], [place] [address]    Report Status PENDING  Incomplete    Coagulation Studies: No results for input(s): LABPROT, INR in the last 72 hours.  Urinalysis: No results for input(s): COLORURINE, LABSPEC, PHURINE, GLUCOSEU, HGBUR, BILIRUBINUR, KETONESUR, PROTEINUR, UROBILINOGEN, NITRITE, LEUKOCYTESUR in the last 72 hours.  Invalid input(s): APPERANCEUR    Imaging: Dg Chest Port 1 View  Result Date: 11/06/2017 CLINICAL DATA:  Mechanical assisted ventilation EXAM: PORTABLE CHEST 1 VIEW COMPARISON:  Two days ago FINDINGS: Central lines with tips at the upper cavoatrial junction. An orogastric tube reaches the stomach. Endotracheal tube tip between the clavicular heads and carina. Artifact from EKG leads. Normal heart size. Haziness of the right more than left lower chest that is unchanged. No Kerley lines or visible pneumothorax. IMPRESSION: 1. Stable positioning of tubes and lines. 2. Stable hazy  appearance of the lower chest, usually atelectasis or pleural fluid. Electronically Signed   By: Monte Fantasia M.D.   On: 11/06/2017  09:44     Medications:   . anticoagulant sodium citrate    . dexmedetomidine (PRECEDEX) IV infusion Stopped (11/07/17 0542)  . fentaNYL infusion INTRAVENOUS 350 mcg/hr (11/08/17 0337)   . chlorhexidine gluconate (MEDLINE KIT)  15 mL Mouth Rinse BID  . hydrocortisone sod succinate (SOLU-CORTEF) inj  25 mg Intravenous Q12H  . mouth rinse  15 mL Mouth Rinse 10 times per day  . nicotine  21 mg Transdermal Daily  . pantoprazole (PROTONIX) IV  40 mg Intravenous QHS  . QUEtiapine  25 mg Oral QHS  . sodium chloride flush  10-40 mL Intracatheter Q12H   anticoagulant sodium citrate, bisacodyl, dextrose, docusate sodium, fentaNYL, heparin, HYDROmorphone (DILAUDID) injection, LORazepam, nystatin cream, sennosides  Assessment/ Plan:  Mr. ASA BAUDOIN is a 27 y.o. white male with asthma, bronchitis, polysubstance abuse, was admitted on3/8/2019after he was found down in a hotel for an unknown period of time in the fetal position. Hospital course includes prolonged CPR for 45 minutes in the emergency room. Ventricular fibrillation arrest, hypothermia protocol.  Urine tox screen positive for cocaine and opiates.  Hospital course complicated by acute respiratory failure and acute renal failure. Started on CRRT 3/8. Underwent disarticulation of right lower extremity due to ischemic limb. Rhabdomyolysis with sustained CPK greater than 50,000 for several days. Extubated self on 3/15. Transitioned to intermittent hemodialysis on 3/16.   1.  Acute renal failure: anuric. Requiring renal replacement therapy. Acute renal failure secondary to ATN, shock, hypotension and rhabdomyolysis.  Blood pressure now at goal. Weaned off vasopressors. Extubated. Continues to have delirium.  Transitioned to intermittent hemodialysis. Schedule for dialysis for tomorrow. More aggressive ultrafiltration.  Monitor for renal recovery. Continue to monitor volume status, urine output, renal function and electrolytes.  Renally dose  all medications.   2.  Rhabdomyolysis: cocaine induced rhabdomyolysis. CPK trending down to 7699  3.  Right lower extremity ischemia: status post disarticulation by Dr. Delana Meyer on 3/12. Appreciate vascular surgery.    LOS: 9 Shannie Kontos 3/17/20199:18 AM

## 2017-11-08 NOTE — Progress Notes (Signed)
Called to patient bedside secondary to bleeding and drainage through dressing and onto bed. Significant Serosang/venous drainage noted.  Dressing changed: posterior muscle bed somewhat dusky. No active bleeding noted. Dry dressing placed.  Concern that patient will need further muscle excision prior to AKA completion/revision.  Awaiting CBC. Will monitor closely.

## 2017-11-08 NOTE — Progress Notes (Signed)
Chaplain offered silent prayer and pastoral presence.

## 2017-11-08 NOTE — Progress Notes (Signed)
[id number]- Dr. Evie LacksEsco here and changed dressing to right leg amputation site.   2215- Patient given sips of clear liquids with coughing episodes.

## 2017-11-08 NOTE — Progress Notes (Signed)
[facility] Critical Care Medicine Progess Note    SYNOPSIS   27 year old gentleman with a past medical history of polysubstance abuse, status post prolonged cardiac arrest, respiratory failure, renal failure, rhabdomyolysis, shock  ASSESSMENT/PLAN   Polysubstance abuse, status post overdose, status post prolonged cardiac arrest, status post targeted temperature management with patient recovering neurologically.  Extubated successfully, converted to intermittent hemodialysis.  Status post disarticulation for persistent rhabdomyolysis and ischemic leg.  Renal failure.  Converted to IHD  Rhabdomyolysis. Status post amputation and disarticulation. CK is 7699  Leukocytosis. Completed a course of antibiotics, stopped yesterday will follow closely  Thrombocytopenia. Plt count stable  PT will require surgery for leg next week  VENTILATOR SETTINGS:    HEMODYNAMICS: CVP:  [20 mmHg] 20 mmHg  INTAKE / OUTPUT:  Intake/Output Summary (Last 24 hours) at 11/08/2017 0818 Last data filed at 11/08/2017 0300 Gross per 24 hour  Intake 1717.5 ml  Output 1500 ml  Net 217.5 ml    Name: Joshua [name] MRN: [id number] DOB: 12-20-1990    ADMISSION DATE:  10/30/2017  SUBJECTIVE:   Over last 24 hours patient had successful extubation. Presently on nasal cannula. He has been confused and agitated. Has been on and off of Precedex along with as needed benzo. CRRT clotted. Per nephrology converting to IHD as patient is off pressors with adequate blood pressure  VITAL SIGNS: Temp:  [98.4 F (36.9 C)-100.6 F (38.1 C)] 98.6 F (37 C) (03/17 0400) Pulse Rate:  [103-137] 103 (03/17 0600) Resp:  [12-30] 22 (03/17 0600) BP: (104-155)/(50-138) 123/100 (03/17 0600) SpO2:  [94 %-100 %] 99 % (03/17 0600) Weight:  [77.9 kg (171 lb 11.8 oz)] 77.9 kg (171 lb 11.8 oz) (03/17 0412)  PHYSICAL EXAMINATION: Physical Examination:   VS: BP (!) 123/100   Pulse (!) 103   Temp 98.6 F (37 C) (Oral)   Resp (!)  22   Ht 5\' 10"  (1.778 m)   Wt 77.9 kg (171 lb 11.8 oz)   SpO2 99%   BMI 24.64 kg/m   General Appearance: critically ill sedated in the intensive care unit Neuro:patient does startle when you call his name Pulmonary: coarse rhonchi appreciated CardiovascularNormal S1,S2.  No m/r/g.   Abdomen: soft exam, positive bowel sounds Extremities: does articulation shows no evidence of bleeding,leg is bandaged and wrapped  LABORATORY PANEL:   CBC Recent Labs  Lab 11/07/17 0351  WBC 20.4*  HGB 8.3*  HCT 25.2*  PLT 155    Chemistries  Recent Labs  Lab 11/04/17 0102  11/06/17 2050 11/07/17 0351 11/08/17 0404  NA  --    < > 137 135 138  K  --    < > 3.5 3.9 3.7  CL  --    < > 105 103 102  CO2  --    < > 26 26 23   GLUCOSE  --    < > 78 94 73  BUN  --    < > 29* 30* 29*  CREATININE  --    < > 2.03* 2.26* 3.13*  CALCIUM  --    < > 8.0* 8.2* 8.4*  MG  --    < > 1.7  --   --   PHOS  --    < > 1.8* 1.9*  --   AST 1,253*  --   --   --   --   ALT 485*  --   --   --   --   ALKPHOS 73  --   --   --   --  BILITOT 0.7  --   --   --   --    < > = values in this interval not displayed.    Recent Labs  Lab 11/07/17 0725 11/07/17 1553 11/07/17 2042 11/07/17 2322 11/08/17 0408 11/08/17 0808  GLUCAP 85 76 80 68 73 57*   No results for input(s): PHART, PCO2ART, PO2ART in the last 168 hours. Recent Labs  Lab 11/04/17 0102  11/06/17 1511 11/06/17 2050 11/07/17 0351  AST 1,253*  --   --   --   --   ALT 485*  --   --   --   --   ALKPHOS 73  --   --   --   --   BILITOT 0.7  --   --   --   --   ALBUMIN 2.5*   < > 2.5* 2.1* 2.2*   < > = values in this interval not displayed.    Cardiac Enzymes Recent Labs  Lab 11/02/17 0527  TROPONINI 2.43*    RADIOLOGY:  Dg Chest Port 1 View  Result Date: 11/06/2017 CLINICAL DATA:  Mechanical assisted ventilation EXAM: PORTABLE CHEST 1 VIEW COMPARISON:  Two days ago FINDINGS: Central lines with tips at the upper cavoatrial junction. An  orogastric tube reaches the stomach. Endotracheal tube tip between the clavicular heads and carina. Artifact from EKG leads. Normal heart size. Haziness of the right more than left lower chest that is unchanged. No Kerley lines or visible pneumothorax. IMPRESSION: 1. Stable positioning of tubes and lines. 2. Stable hazy appearance of the lower chest, usually atelectasis or pleural fluid. Electronically Signed   By: Marnee SpringJonathon  Watts M.D.   On: 11/06/2017 09:44    Tora KindredJohn Florentino Laabs, DO  11/08/2017

## 2017-11-09 DIAGNOSIS — F1910 Other psychoactive substance abuse, uncomplicated: Secondary | ICD-10-CM

## 2017-11-09 DIAGNOSIS — M6282 Rhabdomyolysis: Secondary | ICD-10-CM

## 2017-11-09 DIAGNOSIS — N179 Acute kidney failure, unspecified: Secondary | ICD-10-CM

## 2017-11-09 DIAGNOSIS — J9600 Acute respiratory failure, unspecified whether with hypoxia or hypercapnia: Secondary | ICD-10-CM

## 2017-11-09 DIAGNOSIS — G894 Chronic pain syndrome: Secondary | ICD-10-CM

## 2017-11-09 LAB — COMPREHENSIVE METABOLIC PANEL
ALBUMIN: 2.2 g/dL — AB (ref 3.5–5.0)
ALK PHOS: 50 U/L (ref 38–126)
ALT: 300 U/L — ABNORMAL HIGH (ref 17–63)
AST: 308 U/L — AB (ref 15–41)
Anion gap: 11 (ref 5–15)
BILIRUBIN TOTAL: 0.9 mg/dL (ref 0.3–1.2)
BUN: 51 mg/dL — AB (ref 6–20)
CO2: 23 mmol/L (ref 22–32)
Calcium: 8.5 mg/dL — ABNORMAL LOW (ref 8.9–10.3)
Chloride: 104 mmol/L (ref 101–111)
Creatinine, Ser: 5.33 mg/dL — ABNORMAL HIGH (ref 0.61–1.24)
GFR calc Af Amer: 15 mL/min — ABNORMAL LOW (ref >60)
GFR, EST NON AFRICAN AMERICAN: 13 mL/min — AB (ref >60)
GLUCOSE: 107 mg/dL — AB (ref 65–99)
POTASSIUM: 4 mmol/L (ref 3.5–5.1)
Sodium: 138 mmol/L (ref 135–145)
TOTAL PROTEIN: 5 g/dL — AB (ref 6.5–8.1)

## 2017-11-09 LAB — CBC WITH DIFFERENTIAL/PLATELET
BASOS PCT: 0 %
Basophils Absolute: 0 10*3/uL (ref 0–0.1)
EOS PCT: 0 %
Eosinophils Absolute: 0 10*3/uL (ref 0–0.7)
HCT: 20.6 % — ABNORMAL LOW (ref 40.0–52.0)
HEMOGLOBIN: 7 g/dL — AB (ref 13.0–18.0)
LYMPHS ABS: 1.8 10*3/uL (ref 1.0–3.6)
Lymphocytes Relative: 6 %
MCH: 30.4 pg (ref 26.0–34.0)
MCHC: 33.7 g/dL (ref 32.0–36.0)
MCV: 90.2 fL (ref 80.0–100.0)
MONO ABS: 2.4 10*3/uL — AB (ref 0.2–1.0)
MONOS PCT: 8 %
Neutro Abs: 25.8 10*3/uL — ABNORMAL HIGH (ref 1.4–6.5)
Neutrophils Relative %: 86 %
Platelets: 268 10*3/uL (ref 150–440)
RBC: 2.29 MIL/uL — AB (ref 4.40–5.90)
RDW: 12.8 % (ref 11.5–14.5)
WBC: 30 10*3/uL — ABNORMAL HIGH (ref 3.8–10.6)

## 2017-11-09 LAB — GLUCOSE, CAPILLARY
GLUCOSE-CAPILLARY: 89 mg/dL (ref 65–99)
Glucose-Capillary: 85 mg/dL (ref 65–99)

## 2017-11-09 MED ORDER — DIPHENHYDRAMINE HCL 50 MG/ML IJ SOLN: 12.5000 mg | Freq: Four times a day (QID) | INTRAMUSCULAR | Status: DC | PRN

## 2017-11-09 MED ORDER — HYDROMORPHONE HCL 1 MG/ML IJ SOLN
1.0000 mg | INTRAMUSCULAR | Status: DC | PRN
  Administered 2017-11-09 – 2017-11-10 (×4): 1 mg via INTRAVENOUS
  Filled 2017-11-09 (×3): qty 1

## 2017-11-09 MED ORDER — GABAPENTIN 300 MG PO CAPS
300.0000 mg | ORAL_CAPSULE | Freq: Three times a day (TID) | ORAL | Status: DC
  Administered 2017-11-09 (×2): 300 mg via ORAL
  Filled 2017-11-09 (×2): qty 1

## 2017-11-09 MED ORDER — SODIUM CHLORIDE 0.9% FLUSH: 9.0000 mL | INTRAVENOUS | Status: DC | PRN

## 2017-11-09 MED ORDER — ALPRAZOLAM 0.5 MG PO TABS
0.5000 mg | ORAL_TABLET | Freq: Three times a day (TID) | ORAL | Status: DC | PRN
  Administered 2017-11-09 – 2017-11-25 (×19): 0.5 mg via ORAL
  Filled 2017-11-09 (×21): qty 1

## 2017-11-09 MED ORDER — HYDROMORPHONE 1 MG/ML IV SOLN
INTRAVENOUS | Status: DC
  Administered 2017-11-09 – 2017-11-10 (×2): 25 mg via INTRAVENOUS
  Filled 2017-11-09 (×4): qty 25

## 2017-11-09 MED ORDER — CEFAZOLIN SODIUM-DEXTROSE 2-4 GM/100ML-% IV SOLN
2.0000 g | INTRAVENOUS | Status: AC
  Administered 2017-11-10: 2 g via INTRAVENOUS
  Filled 2017-11-09: qty 100

## 2017-11-09 MED ORDER — OXYCODONE HCL ER 40 MG PO T12A
40.0000 mg | EXTENDED_RELEASE_TABLET | Freq: Two times a day (BID) | ORAL | Status: DC
  Administered 2017-11-09 – 2017-11-10 (×4): 40 mg via ORAL
  Filled 2017-11-09 (×4): qty 1

## 2017-11-09 MED ORDER — FUROSEMIDE 10 MG/ML IJ SOLN
80.0000 mg | Freq: Once | INTRAMUSCULAR | Status: AC
  Administered 2017-11-09: 80 mg via INTRAVENOUS
  Filled 2017-11-09: qty 8

## 2017-11-09 MED ORDER — ADULT MULTIVITAMIN W/MINERALS CH
1.0000 | ORAL_TABLET | Freq: Every day | ORAL | Status: DC
  Administered 2017-11-10 – 2017-11-15 (×6): 1 via ORAL
  Filled 2017-11-09 (×6): qty 1

## 2017-11-09 MED ORDER — NALOXONE HCL 0.4 MG/ML IJ SOLN: 0.4000 mg | INTRAMUSCULAR | Status: DC | PRN

## 2017-11-09 MED ORDER — ONDANSETRON HCL 4 MG/2ML IJ SOLN: 4.0000 mg | Freq: Four times a day (QID) | INTRAMUSCULAR | Status: DC | PRN

## 2017-11-09 MED ORDER — DIPHENHYDRAMINE HCL 12.5 MG/5ML PO ELIX
12.5000 mg | ORAL_SOLUTION | Freq: Four times a day (QID) | ORAL | Status: DC | PRN
  Filled 2017-11-09: qty 5

## 2017-11-09 MED ORDER — ENSURE ENLIVE PO LIQD
237.0000 mL | Freq: Three times a day (TID) | ORAL | Status: DC
  Administered 2017-11-09 – 2017-11-26 (×28): 237 mL via ORAL

## 2017-11-09 NOTE — Progress Notes (Addendum)
Daily Progress Note   Patient Name: Joshua [name]       Date: 11/09/2017 DOB: Nov 26, 1990  Age: 27 y.o. MRN#: [id number] Attending Physician: Gorden Harms, MD Primary Care Physician: Patient, No Pcp Per Admit Date: 10/30/2017  Reason for Consultation/Follow-up: Establishing goals of care  Subjective: Patient resting in bed, self extubated over the weekend. He is alert at this time watching t.v. He would like to know what motel he was found in. He tells me they are making some bad batches of drugs and lacing them with Fentanyl. He states he has been using drugs to escape reality.  He states his mother raised him the best she could.  He tells me of his father's death and his grandfathers death. He denies complaint at this time. He states he knows it worries his mother to see him in the hospital and would like to leave but understands he still needs care.    Length of Stay: 10  Current Medications: Scheduled Meds:  . chlorhexidine gluconate (MEDLINE KIT)  15 mL Mouth Rinse BID  . furosemide  80 mg Intravenous Once  . hydrocortisone sod succinate (SOLU-CORTEF) inj  25 mg Intravenous Q12H  . mouth rinse  15 mL Mouth Rinse 10 times per day  . nicotine  21 mg Transdermal Daily  . pantoprazole (PROTONIX) IV  40 mg Intravenous QHS  . QUEtiapine  25 mg Oral QHS  . sodium chloride flush  10-40 mL Intracatheter Q12H    Continuous Infusions: . anticoagulant sodium citrate    . dexmedetomidine (PRECEDEX) IV infusion Stopped (11/07/17 0542)  . fentaNYL infusion INTRAVENOUS 300 mcg/hr (11/09/17 0150)    PRN Meds: anticoagulant sodium citrate, bisacodyl, dextrose, docusate sodium, fentaNYL, heparin, HYDROmorphone (DILAUDID) injection, LORazepam, nystatin cream, sennosides  Physical Exam    Constitutional: No distress.  Pulmonary/Chest: Effort normal.  Neurological: He is alert.  Skin: Skin is warm and dry.            Vital Signs: BP (!) 153/86   Pulse 99   Temp 98 F (36.7 C) (Oral)   Resp 13   Ht '5\' 10"'  (1.778 m)   Wt 77.9 kg (171 lb 11.8 oz)   SpO2 100%   BMI 24.64 kg/m  SpO2: SpO2: 100 % O2 Device: O2 Device: Nasal Cannula O2 Flow Rate:  O2 Flow Rate (L/min): 2 L/min  Intake/output summary:   Intake/Output Summary (Last 24 hours) at 11/09/2017 4034 Last data filed at 11/09/2017 0000 Gross per 24 hour  Intake 702.92 ml  Output -  Net 702.92 ml   LBM: Last BM Date: 11/05/17 Baseline Weight: Weight: 74.8 kg (165 lb) Most recent weight: Weight: 77.9 kg (171 lb 11.8 oz)       Palliative Assessment/Data: 40%    Flowsheet Rows     Most Recent Value  Intake Tab  Referral Department  Hospitalist  Unit at Time of Referral  Med/Surg Unit  Palliative Care Primary Diagnosis  Other (Comment) [multiporgan failure]  Date Notified  11/04/17  Palliative Care Type  New Palliative care  Reason for referral  Clarify Goals of Care  Date of Admission  10/30/17  Date first seen by Palliative Care  11/04/17  # of days Palliative referral response time  0 Day(s)  # of days IP prior to Palliative referral  5  Clinical Assessment  Psychosocial & Spiritual Assessment  Palliative Care Outcomes      Patient Active Problem List   Diagnosis Date Noted  . Acute kidney failure, unspecified ([place]) 11/02/2017  . Rhabdomyolysis 11/02/2017  . Acute respiratory failure ([place]) 11/02/2017  . Cardiogenic shock ([place]) 10/30/2017  . Cardiac arrest ([place]) 10/30/2017  . Drug abuse ([place]) 10/30/2017    Palliative Care Assessment & Plan   Patient Profile: 27 year old gentleman ith a past medical history of polysubstance abuse, status post prolonged cardiac arrest, respiratory failure, renal failure, rhabdomyolysis, shock.  Assessment/ Recommendations/Plan: Improving  Continuing  current care at this time.   Will continue to follow.   Code Status:    Code Status Orders  (From admission, onward)        Start     Ordered   11/04/17 1543  Limited resuscitation (code)  Continuous    Question Answer Comment  In the event of cardiac or respiratory ARREST: Initiate Code Blue, Call Rapid Response Yes   In the event of cardiac or respiratory ARREST: Perform CPR No   In the event of cardiac or respiratory ARREST: Perform Intubation/Mechanical Ventilation Yes   In the event of cardiac or respiratory ARREST: Use NIPPV/BiPAp only if indicated Yes   In the event of cardiac or respiratory ARREST: Administer ACLS medications if indicated Yes   In the event of cardiac or respiratory ARREST: Perform Defibrillation or Cardioversion if indicated No   Comments patient already intubated. Continue current intubation.      11/04/17 1544    Code Status History    Date Active Date Inactive Code Status Order ID Comments User Context   10/30/2017 21:10 11/04/2017 15:44 Full Code [id number]  Mikael Spray, NP Inpatient   10/30/2017 18:13 10/30/2017 21:10 Full Code [id number]  Vaughan Basta, MD Inpatient       Prognosis:  Guarded. Patient is improving.  Discharge Planning:  To Be Determined   Thank you for allowing the Palliative Medicine Team to assist in the care of this patient.   Total Time 35 min Prolonged Time Billed  NO       Greater than 50%  of this time was spent counseling and coordinating care related to the above assessment and plan.  Asencion Gowda, NP  Please contact Palliative Medicine Team phone at [phone] for questions and concerns.

## 2017-11-09 NOTE — Progress Notes (Signed)
[facility]  Daily Progress Note   Subjective: Off vent.  Seen with Dr. Gilda CreaseSchnier.  Had discussion with patient about returning to the operating room tomorrow for a revision above-the-knee amputation.   POD#6: Right knee disarticulation with amputation  Objective: Vitals:   11/09/17 1427 11/09/17 1500 11/09/17 1546 11/09/17 1600  BP: (!) 150/87 140/78  (!) 147/89  Pulse:    (P) 95  Resp:  18 16 (!) 22  Temp:    98.7 F (37.1 C)  TempSrc:    Oral  SpO2:   100% (P) 100%  Weight:      Height:        Intake/Output Summary (Last 24 hours) at 11/09/2017 1823 Last data filed at 11/09/2017 1500 Gross per 24 hour  Intake 1042.42 ml  Output -  Net 1042.42 ml   Physical Exam: Alert, NAD CV: RRR Pulmonary: CTA Bilaterally Abdomen: Soft, Nontender, Nondistended Vascular:  Right Lower Extremity: Serosanguineous drainage to Ace wrap.  Thigh is soft.   Laboratory: CBC    Component Value Date/Time   WBC 30.0 (H) 11/09/2017 0444   HGB 7.0 (L) 11/09/2017 0444   HGB 16.2 05/08/2014 2307   HCT 20.6 (L) 11/09/2017 0444   HCT 48.0 05/08/2014 2307   PLT 268 11/09/2017 0444   PLT 297 05/08/2014 2307   BMET    Component Value Date/Time   NA 138 11/09/2017 0444   NA 141 05/08/2014 2307   K 4.0 11/09/2017 0444   K 4.0 05/08/2014 2307   CL 104 11/09/2017 0444   CL 103 05/08/2014 2307   CO2 23 11/09/2017 0444   CO2 31 05/08/2014 2307   GLUCOSE 107 (H) 11/09/2017 0444   GLUCOSE 95 05/08/2014 2307   BUN 51 (H) 11/09/2017 0444   BUN 19 (H) 05/08/2014 2307   CREATININE 5.33 (H) 11/09/2017 0444   CREATININE 1.02 05/08/2014 2307   CALCIUM 8.5 (L) 11/09/2017 0444   CALCIUM 9.6 05/08/2014 2307   GFRNONAA 13 (L) 11/09/2017 0444   GFRNONAA >60 05/08/2014 2307   GFRAA 15 (L) 11/09/2017 0444   GFRAA >60 05/08/2014 2307   Assessment/Planning: 27 year old male status post cardiac arrest, POD#6 Right knee disarticulation with amputation - Stable 1) Will will plan on  right above-the-knee revision amputation tomorrow (Tues) with Dr. Charlie PitterSchnier  Jock Mahon PA-C 11/09/2017 6:23 PM

## 2017-11-09 NOTE — Progress Notes (Signed)
Cg=haplain followed up with Pt. Pt is now alert and Chaplain talked with Pt about his needs. Pt begin to doze of. Chaplain prayed wioth the Pt and told him he would return to take to him    11/09/17 1100  Clinical Encounter Type  Visited With Patient  Visit Type Follow-up  Referral From Palliative care team  Spiritual Encounters  Spiritual Needs Prayer;Emotional

## 2017-11-09 NOTE — Progress Notes (Signed)
Pt seen by MD Thedore MinsSingh, on rounds, reported low grade temp of 99 and pt having chills, MD ordered blood cultures x 1.

## 2017-11-09 NOTE — Progress Notes (Signed)
Pre HD Tx, pt deemed stable to come to unit for tx.    11/09/17 1600  Vital Signs  Temp 98.7 F (37.1 C)  Temp Source Oral  Pulse Rate 95  Pulse Rate Source Monitor  Resp (!) 22  BP (!) 147/89  Oxygen Therapy  SpO2 100 %  O2 Device Room Air  Pain Assessment  Pain Assessment 0-10  Pain Score 10  Pain Intervention(s) Medication (See eMAR)  Time-Out for Hemodialysis  What Procedure? HD  Pt Identifiers(min of two) First/Last Name;MRN/Account#  Correct Site? Yes  Correct Side? Yes  Correct Procedure? Yes  Consents Verified? Yes  Rad Studies Available? N/A  Safety Precautions Reviewed? Yes  Biochemist, clinicalMachine Checks  Machine Number [id number]  Station Number 1  UF/Alarm Test Passed  Conductivity: Meter 14  Conductivity: Machine  14  pH 7.4  Reverse Osmosis Main  Normal Saline Lot Number [id number]  Dialyzer Lot Number [id number]  Disposable Set Lot Number [id number]  Machine Temperature 98.6 F (37 C)  ImmunologistAir Detector Armed and Audible Yes  Blood Lines Intact and Secured Yes  Pre Treatment Patient Checks  Vascular access used during treatment Catheter  Hepatitis B Surface Antigen Results (unknown )  Isolation Initiated Yes  Hepatitis B Surface Antibody (unknown )  Date Hepatitis B Surface Antibody Drawn 11/07/17  Hemodialysis Consent Verified Yes  Hemodialysis Standing Orders Initiated Yes  ECG (Telemetry) Monitor On Yes  Prime Ordered Normal Saline  Length of  DialysisTreatment -hour(s) 3 Hour(s)  Dialysis Treatment Comments Na 140  Dialyzer Elisio 17H NR  Dialysate 3K, 2.5 Ca  Dialysate Flow Ordered 600  Blood Flow Rate Ordered 300 mL/min  Ultrafiltration Goal 0.5 Liters  Dialysis Blood Pressure Support Ordered Normal Saline  Education / Care Plan  Dialysis Education Provided Yes  Documented Education in Care Plan Yes  Hemodialysis Catheter Right Internal jugular Double-lumen  Placement Date/Time: 10/31/17 1100   Placed prior to admission: No  Time Out: Correct patient;Correct  procedure  Maximum sterile barrier precautions: Hand hygiene;Sterile gloves;Large sterile sheet;Mask;Sterile gown;Cap  Site Prep: Chlorhexidine  Loca...  Site Condition No complications  Dressing Type Occlusive;Biopatch  Dressing Status Clean;Dry;Intact  Dressing Change Due 11/14/17

## 2017-11-09 NOTE — Progress Notes (Signed)
Patient given 80 of lasix per Dr. Doristine ChurchSingh's order. Patient had no output, bladder scan showed only 24mL in bladder. Patient also complains of pain despite PCA pump and oxycontin. Dr. Gilda CreaseSchnier placed order for dilaudid IV. Given to patient, off floor to dialysis at this time. Trudee KusterBrandi R [place]

## 2017-11-09 NOTE — Progress Notes (Signed)
Pt profile: [age] M with hx of polysubstance abuse adm 03/28 after found in hotel room unresponsive and suffered VF arrest during transport by EMS to ED. UDS positive for cocaine and opiates.  Found to have ischemic RLE with severe rhabdomyolysis.  Underwent RLE disarticulation at the knee 03/12.  Self extubated 03/15.  ICU course complicated by anuric renal failure requiring hemodialysis and chronic pain syndrome with extremely high opiate tolerance  Echo 03/09: LVEF 35-40%  Subj: Complains of pain all over, especially RLE.  Denies dyspnea.  Fully awake and interactive on 300 mcg/hour fentanyl.  Oriented to self, place, circumstance  Obj: Vitals:   11/09/17 1500 11/09/17 1546  BP: 140/78   Pulse:    Resp: 18 16  Temp:    SpO2:  100%    Gen: NAD HEENT: NCAT, poor dentition, sclerae white Neck: No JVD noted Chest: Clear anteriorly Cardiac: Reg, no M ZOX:WRUEAbd:NABS, soft Ext: RLE amputation. Wound dressed. No LLE edema  BMP Latest Ref Rng & Units 11/09/2017 11/08/2017 11/07/2017  Glucose 65 - 99 mg/dL 454(U107(H) 73 94  BUN 6 - 20 mg/dL 98(J51(H) 19(J29(H) 47(W30(H)  Creatinine 0.61 - 1.24 mg/dL 2.95(A5.33(H) 2.13(Y3.13(H) 8.65(H2.26(H)  Sodium 135 - 145 mmol/L 138 138 135  Potassium 3.5 - 5.1 mmol/L 4.0 3.7 3.9  Chloride 101 - 111 mmol/L 104 102 103  CO2 22 - 32 mmol/L 23 23 26   Calcium 8.9 - 10.3 mg/dL 8.4(O8.5(L) 9.6(E8.4(L) 9.5(M8.2(L)     CBC Latest Ref Rng & Units 11/09/2017 11/08/2017 11/07/2017  WBC 3.8 - 10.6 K/uL 30.0(H) 24.3(H) 20.4(H)  Hemoglobin 13.0 - 18.0 g/dL 7.0(L) 7.4(L) 8.3(L)  Hematocrit 40.0 - 52.0 % 20.6(L) 22.7(L) 25.2(L)  Platelets 150 - 440 K/uL 268 225 155      CXR: No new film   IMPRESSION: Status post prolonged cardio respiratory arrest due to drug overdose Dilated cardiomyopathy (LVEF 35-40%) Status post RLE disarticulation due to ischemic extremity Severe rhabdomyolysis Anuric AKI Polysubstance abuse Chronic pain syndrome Extremely high opiate tolerance  Leukocytosis of unclear  etiology  PLAN/RECS:  Changed to SDU status Continue supplemental oxygen as needed Continue to monitor hemodynamics Plan for HD today Lasix ordered per renal service We will try to transition off of fentanyl infusion - OxyContin with Dilaudid PCA Monitor BMET intermittently Monitor I/Os Correct electrolytes as indicated  DVT px:  Monitor CBC intermittently Transfuse per usual guidelines  Postop management per vascular surgery.  Plan for revision later this week  Billy Fischeravid Demerius Podolak, MD PCCM service Mobile [phone] Pager [phone] 11/09/2017 4:10 PM

## 2017-11-09 NOTE — Progress Notes (Signed)
Post HD Tx    11/09/17 2117  Vital Signs  Pulse Rate 84  Pulse Rate Source Monitor  Resp 20  BP 131/75  BP Location Left Arm  BP Method Automatic  Patient Position (if appropriate) Lying  Oxygen Therapy  SpO2 96 %  O2 Device Room Air  Pulse Oximetry Type Continuous  Dialysis Weight  Weight 75 kg (165 lb 5.5 oz)  Type of Weight Post-Dialysis  Post-Hemodialysis Assessment  Rinseback Volume (mL) 250 mL  Dialyzer Clearance Lightly streaked  Duration of HD Treatment -hour(s) 3 hour(s)  Hemodialysis Intake (mL) 500 mL  UF Total -Machine (mL) 1167 mL  Net UF (mL) 667 mL  Tolerated HD Treatment Yes  Post-Hemodialysis Comments Pt stable to return to room

## 2017-11-09 NOTE — Progress Notes (Signed)
HD Tx ended    11/09/17 2115  Vital Signs  Temp 99 F (37.2 C)  Pulse Rate 84  Pulse Rate Source Monitor  Resp 20  BP 139/85  BP Location Left Arm  BP Method Automatic  Patient Position (if appropriate) Lying  Oxygen Therapy  SpO2 96 %  O2 Device Room Air  Pulse Oximetry Type Continuous  During Hemodialysis Assessment  Blood Flow Rate (mL/min) 300 mL/min  Arterial Pressure (mmHg) -80 mmHg  Venous Pressure (mmHg) 70 mmHg  Transmembrane Pressure (mmHg) 50 mmHg  Ultrafiltration Rate (mL/min) 400 mL/min  Dialysate Flow Rate (mL/min) 600 ml/min  Conductivity: Machine  14  HD Safety Checks Performed Yes  Dialysis Fluid Bolus Normal Saline  Bolus Amount (mL) 250 mL  Intra-Hemodialysis Comments Tx completed;Tolerated well  Hemodialysis Catheter Right Internal jugular Double-lumen  Placement Date/Time: 10/31/17 1100   Placed prior to admission: No  Time Out: Correct patient;Correct procedure  Maximum sterile barrier precautions: Hand hygiene;Sterile gloves;Large sterile sheet;Mask;Sterile gown;Cap  Site Prep: Chlorhexidine  Loca...  Site Condition No complications  Blue Lumen Status Heparin locked  Red Lumen Status Heparin locked  Purple Lumen Status N/A  Catheter fill solution Heparin 1000 units/ml  Catheter fill volume (Arterial) 1.3 cc  Catheter fill volume (Venous) 1.3  Dressing Type Occlusive;Biopatch  Dressing Status Clean;Dry;Intact  Post treatment catheter status Capped and Clamped

## 2017-11-09 NOTE — Progress Notes (Signed)
   11/09/17 1155  Clinical Encounter Type  Visited With Patient and family together  Visit Type Follow-up   Chaplain introduced self to patient as last time she met with family patient was not able to engage.  Acknowledged connection with other chaplain but wanted to establish introduction and rapport.  No needs at present.

## 2017-11-09 NOTE — Progress Notes (Signed)
[facility]  ROUNDING NOTE   Subjective:   Patient is doing fair He is sitting up in the bed able to take and clears Denies any nausea and vomiting; some cough Nursing staff reports no urine output Bladder scan showed less than 10 mL's yesterday   Objective:  Vital signs in last 24 hours:  Temp:  [98 F (36.7 C)-98.8 F (37.1 C)] 98 F (36.7 C) (03/18 0800) Pulse Rate:  [99-114] 99 (03/18 0800) Resp:  [13-31] 13 (03/18 0800) BP: (110-157)/(55-96) 153/86 (03/18 0800) SpO2:  [93 %-100 %] 100 % (03/18 0800)  Weight change:  Filed Weights   11/06/17 0351 11/07/17 0400 11/08/17 0412  Weight: 81.6 kg (179 lb 14.3 oz) 81.7 kg (180 lb 1.9 oz) 77.9 kg (171 lb 11.8 oz)    Intake/Output: I/O last 3 completed shifts: In: 2420.4 [I.V.:2420.4] Out: -    Intake/Output this shift:  No intake/output data recorded.  Physical Exam: General: Ill appearing  Head: [place]/AT  Eyes:  Anicteric  Neck: Right IJ dialysis catheter, Left TLC  Lungs:   Decreased breath sounds at bases otherwise clear  Heart:  Regular, tachycardic  Abdomen:  Soft, nontender  Extremities: Right amputation, left with edema  Neurologic:  Alert, able to follow commands     Access: RIJ temp HD catheter 3/8 Dr. Jefferson Fuel    Basic Metabolic Panel: Recent Labs  Lab 11/04/17 2100  11/05/17 4765  11/05/17 2139 11/06/17 0221 11/06/17 4650 11/06/17 1511 11/06/17 2050 11/07/17 0351 11/08/17 0404 11/09/17 0444  NA 139   < > 140   < >  --  137 136 138 137 135 138 138  K 3.7   < > 4.4   < >  --  4.0 4.0 4.0 3.5 3.9 3.7 4.0  CL 106   < > 106   < >  --  103 102 102 105 103 102 104  CO2 24   < > 25   < >  --  '28 24 26 26 26 23 23  ' GLUCOSE 90   < > 92   < >  --  125* 129* 89 78 94 73 107*  BUN 29*   < > 26*   < >  --  30* 30* 31* 29* 30* 29* 51*  CREATININE 2.25*   < > 2.25*   < >  --  2.19* 2.04* 2.12* 2.03* 2.26* 3.13* 5.33*  CALCIUM 7.9*   < > 7.6*   < >  --  7.9* 8.0* 8.3* 8.0* 8.2* 8.4* 8.5*  MG  1.7  --  1.6*  --  2.0  --  1.8  --  1.7  --   --   --   PHOS 1.8*   < > 4.1   < >  --  3.0 3.1 2.0* 1.8* 1.9*  --   --    < > = values in this interval not displayed.    Liver Function Tests: Recent Labs  Lab 11/04/17 0102  11/06/17 0954 11/06/17 1511 11/06/17 2050 11/07/17 0351 11/09/17 0444  AST 1,253*  --   --   --   --   --  308*  ALT 485*  --   --   --   --   --  300*  ALKPHOS 73  --   --   --   --   --  50  BILITOT 0.7  --   --   --   --   --  0.9  PROT 5.5*  --   --   --   --   --  5.0*  ALBUMIN 2.5*   < > 2.2* 2.5* 2.1* 2.2* 2.2*   < > = values in this interval not displayed.   No results for input(s): LIPASE, AMYLASE in the last 168 hours. No results for input(s): AMMONIA in the last 168 hours.  CBC: Recent Labs  Lab 11/05/17 0946 11/06/17 0954 11/07/17 0351 11/08/17 0404 11/09/17 0444  WBC 15.9* 18.9* 20.4* 24.3* 30.0*  NEUTROABS 12.9* 16.4* 16.9* 19.2* 25.8*  HGB 8.1* 7.8* 8.3* 7.4* 7.0*  HCT 24.3* 23.3* 25.2* 22.7* 20.6*  MCV 90.9 91.5 91.5 92.9 90.2  PLT 131* 152 155 225 268    Cardiac Enzymes: Recent Labs  Lab 11/05/17 1630 11/06/17 0221 11/06/17 2050 11/07/17 0351 11/08/17 0404  CKTOTAL 16,006* 13,244* 7,941* 7,569* 7,699*    BNP: Invalid input(s): POCBNP  CBG: Recent Labs  Lab 11/08/17 1543 11/08/17 2016 11/08/17 2310 11/09/17 0400 11/09/17 0823  GLUCAP 95 101* 101* 89 85    Microbiology: Results for orders placed or performed during the hospital encounter of 10/30/17  MRSA PCR Screening     Status: None   Collection Time: 10/30/17  7:54 PM  Result Value Ref Range Status   MRSA by PCR NEGATIVE NEGATIVE Final    Comment:        The GeneXpert MRSA Assay (FDA approved for NASAL specimens only), is one component of a comprehensive MRSA colonization surveillance program. It is not intended to diagnose MRSA infection nor to guide or monitor treatment for MRSA infections. Performed at [facility], [address] [place], [place] [address]   Culture, respiratory (NON-Expectorated)     Status: None   Collection Time: 11/05/17  8:17 AM  Result Value Ref Range Status   Specimen Description   Final    TRACHEAL ASPIRATE Performed at [facility], [address] [place], [place] [address]    Special Requests   Final    NONE Performed at [facility], [address] [place], [place] [address]    Gram Stain   Final    FEW WBC PRESENT, PREDOMINANTLY PMN MODERATE GRAM NEGATIVE RODS RARE YEAST Performed at [facility], [place] [address] [place],  [address]    Culture MODERATE ENTEROBACTER CLOACAE  Final   Report Status 11/08/2017 FINAL  Final   Organism ID, Bacteria ENTEROBACTER CLOACAE  Final      Susceptibility   Enterobacter cloacae - MIC*    CEFAZOLIN >=64 RESISTANT Resistant     CEFEPIME <=1 SENSITIVE Sensitive     CEFTAZIDIME <=1 SENSITIVE Sensitive     CEFTRIAXONE <=1 SENSITIVE Sensitive     CIPROFLOXACIN <=0.25 SENSITIVE Sensitive     GENTAMICIN <=1 SENSITIVE Sensitive     IMIPENEM <=0.25 SENSITIVE Sensitive     TRIMETH/SULFA <=20 SENSITIVE Sensitive     PIP/TAZO <=4 SENSITIVE Sensitive     * MODERATE ENTEROBACTER CLOACAE    Coagulation Studies: No results for input(s): LABPROT, INR in the last 72 hours.  Urinalysis: No results for input(s): COLORURINE, LABSPEC, PHURINE, GLUCOSEU, HGBUR, BILIRUBINUR, KETONESUR, PROTEINUR, UROBILINOGEN, NITRITE, LEUKOCYTESUR in the last 72 hours.  Invalid input(s): APPERANCEUR    Imaging: No results found.   Medications:   . anticoagulant sodium citrate    . dexmedetomidine (PRECEDEX) IV infusion Stopped (11/07/17 0542)  . fentaNYL infusion INTRAVENOUS 300 mcg/hr (11/09/17 0150)   . chlorhexidine gluconate (MEDLINE KIT)  15 mL Mouth Rinse BID  . hydrocortisone sod succinate (SOLU-CORTEF) inj  25 mg Intravenous Q12H  . mouth rinse  15 mL Mouth Rinse 10 times per day  . nicotine  21 mg Transdermal Daily  .  pantoprazole (PROTONIX) IV  40 mg Intravenous QHS  . QUEtiapine  25 mg Oral QHS  . sodium chloride flush  10-40 mL Intracatheter Q12H   anticoagulant sodium citrate, bisacodyl, dextrose, docusate sodium, fentaNYL, heparin, HYDROmorphone (DILAUDID) injection, LORazepam, nystatin cream, sennosides  Assessment/ Plan:  [name] is a 27 y.o. white male with asthma, bronchitis, polysubstance abuse, was admitted on3/8/2019after he was found down in a hotel for an unknown period of time in the fetal position. Hospital course includes prolonged CPR for 45 minutes in the emergency room. Ventricular fibrillation arrest, hypothermia protocol.  Urine tox screen positive for cocaine and opiates.  Hospital course complicated by acute respiratory failure and acute renal failure. Started on CRRT 3/8. Underwent disarticulation of right lower extremity due to ischemic limb. Rhabdomyolysis with sustained CPK greater than 50,000 for several days.Self extubated on 3/15. Transitioned to intermittent hemodialysis on 3/16.   1.  Acute renal failure:  Acute renal failure secondary to ATN, shock, hypotension and rhabdomyolysis.  still anuric.  Requiring renal replacement therapy. Patient is scheduled for intermittent hemodialysis today Monitor for renal recovery. Continue to monitor volume status, urine output, renal function and electrolytes.  Renally dose all medications.  Trial of 1 dose of IV Lasix  2.  Rhabdomyolysis: cocaine induced rhabdomyolysis. CPK trending down to 7699  3.  Right lower extremity ischemia: status post disarticulation by Dr. Delana Meyer on 3/12.      LOS: [place] 3/18/20198:43 AM

## 2017-11-09 NOTE — Progress Notes (Signed)
HD Tx started    11/09/17 1715  Vital Signs  Pulse Rate 98  Pulse Rate Source Monitor  Resp (!) 25  BP 134/72  BP Location Left Arm  BP Method Automatic  Patient Position (if appropriate) Lying  Oxygen Therapy  SpO2 100 %  O2 Device Room Air  Pulse Oximetry Type Continuous  During Hemodialysis Assessment  Blood Flow Rate (mL/min) 300 mL/min  Arterial Pressure (mmHg) -80 mmHg  Venous Pressure (mmHg) 70 mmHg  Transmembrane Pressure (mmHg) 50 mmHg  Ultrafiltration Rate (mL/min) 400 mL/min  Dialysate Flow Rate (mL/min) 600 ml/min  Conductivity: Machine  14  HD Safety Checks Performed Yes  Dialysis Fluid Bolus Normal Saline  Bolus Amount (mL) 250 mL  Intra-Hemodialysis Comments Tx initiated (Pt stable )

## 2017-11-09 NOTE — Progress Notes (Signed)
Nutrition Follow-up  DOCUMENTATION CODES:   Not applicable  INTERVENTION:  Provide Ensure Enlive po TID, each supplement provides 350 kcal and 20 grams of protein.  Provide daily MVI.  NUTRITION DIAGNOSIS:   Inadequate oral intake related to decreased appetite as evidenced by per patient/family report.  New nutrition diagnosis.  GOAL:   Patient will meet greater than or equal to 90% of their needs  Progressing.  MONITOR:   PO intake, Supplement acceptance, Labs, Weight trends, Skin, I & O's  REASON FOR ASSESSMENT:   Ventilator    ASSESSMENT:   27 year old male with PMHx of bronchitis, asthma, polysubstance abuse who is admitted after being found unresponsive in hotel room s/p cardiac arrest with unknown down time and found to be in ventricular fibrillation, also with renal failure, rhabdomyolysis, leukocytosis, shock, and ischemic right lower extremity after being in fetal position. Patient was intubated 3/8 and started on 36C TTM.   -Rectal tube was placed on 3/11 and removed on 3/17. 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 -Patient self-extubated on 3/15 while in pressure support mode. -CVVHD was stopped on AM of 3/16. He had iHD that afternoon with 1.5 L UF. -Diet was advanced to clear liquids on 3/17 and regular on 3/18.  Met with patient and family members at bedside. Patient had just received his first regular tray. Family was giving him bites at a time to try. He reports his appetite is decreased. Discussed this is normal and that it should return over time. Denies N/V or abdominal pain. His throat is sore and he is coughing. He is amenable to drinking Ensure to help meet calorie and protein needs until diet returns to normal.  Medications reviewed and include: PCA pump (Dilaudid), Seroquel, fentanyl gtt.  Labs reviewed: BUN 51, Creatinine 5.33.  I/O: no UOP yesterday  Weight trend: 77.9 kg on 3/17; -1.9 kg since admission  Discussed with RN and in rounds. Patient  has been anuric. Plan is for trial of Lasix today and another session of iHD.  Diet Order:  Diet regular Room service appropriate? Yes; Fluid consistency: Thin  EDUCATION NEEDS:   No education needs have been identified at this time  Skin:  Skin Assessment: Skin Integrity Issues:(MSAD to groin; closed incision right leg)  Last BM:  11/08/2016 - medium type 7  Height:   Ht Readings from Last 1 Encounters:  10/30/17 '5\' 10"'  (1.778 m)    Weight:   Wt Readings from Last 1 Encounters:  11/08/17 171 lb 11.8 oz (77.9 kg)    Ideal Body Weight:  75.5 kg  BMI:  Body mass index is 24.64 kg/m.  Estimated Nutritional Needs:   Kcal:  1958 (PSU 2003b w/ MSJ 1776, Ve 8.7, Tmax 37.1)  Protein:  144-160 grams (1.8-2 grams/kg)  Fluid:  1.5 L/day  Willey Blade, MS, RD, LDN Office: [phone] Pager: [phone] After Hours/Weekend Pager: [phone]

## 2017-11-09 NOTE — Progress Notes (Signed)
[facility] [place] [facility]   PATIENT NAME: Joshua [name]    MR#:  [id number]  DATE OF BIRTH:  Jan 03, 1991  SUBJECTIVE:  CHIEF COMPLAINT:   Chief Complaint  Patient presents with  . Drug Overdose  Case discussed with intensivist, continue ICU care, to the plans for operative intervention on tomorrow per vascular surgery of lower extremity, nephrology note reviewed  REVIEW OF SYSTEMS:  CONSTITUTIONAL: No fever, fatigue or weakness.  EYES: No blurred or double vision.  EARS, NOSE, AND THROAT: No tinnitus or ear pain.  RESPIRATORY: No cough, shortness of breath, wheezing or hemoptysis.  CARDIOVASCULAR: No chest pain, orthopnea, edema.  GASTROINTESTINAL: No nausea, vomiting, diarrhea or abdominal pain.  GENITOURINARY: No dysuria, hematuria.  ENDOCRINE: No polyuria, nocturia,  HEMATOLOGY: No anemia, easy bruising or bleeding SKIN: No rash or lesion. MUSCULOSKELETAL: No joint pain or arthritis.   NEUROLOGIC: No tingling, numbness, weakness.  PSYCHIATRY: No anxiety or depression.   ROS  DRUG ALLERGIES:  No Known Allergies  VITALS:  Blood pressure (!) 155/94, pulse (!) 118, temperature 98.9 F (37.2 C), temperature source Oral, resp. rate 18, height 5\' 10"  (1.778 m), weight 77.9 kg (171 lb 11.8 oz), SpO2 100 %.  PHYSICAL EXAMINATION:  GENERAL:  27 y.o.-year-old patient lying in the bed with no acute distress.  EYES: Pupils equal, round, reactive to light and accommodation. No scleral icterus. Extraocular muscles intact.  HEENT: Head atraumatic, normocephalic. Oropharynx and nasopharynx clear.  NECK:  Supple, no jugular venous distention. No thyroid enlargement, no tenderness.  LUNGS: Normal breath sounds bilaterally, no wheezing, rales,rhonchi or crepitation. No use of accessory muscles of respiration.  CARDIOVASCULAR: S1, S2 normal. No murmurs, rubs, or gallops.  ABDOMEN: Soft, nontender, nondistended. Bowel sounds present. No organomegaly or mass.   EXTREMITIES: No pedal edema, cyanosis, or clubbing.  NEUROLOGIC: Cranial nerves II through XII are intact. Muscle strength 5/5 in all extremities. Sensation intact. Gait not checked.  PSYCHIATRIC: The patient is alert and oriented x 3.  SKIN: No obvious rash, lesion, or ulcer.   Physical Exam LABORATORY PANEL:   CBC Recent Labs  Lab 11/09/17 0444  WBC 30.0*  HGB 7.0*  HCT 20.6*  PLT 268   ------------------------------------------------------------------------------------------------------------------  Chemistries  Recent Labs  Lab 11/06/17 2050  11/09/17 0444  NA 137   < > 138  K 3.5   < > 4.0  CL 105   < > 104  CO2 26   < > 23  GLUCOSE 78   < > 107*  BUN 29*   < > 51*  CREATININE 2.03*   < > 5.33*  CALCIUM 8.0*   < > 8.5*  MG 1.7  --   --   AST  --   --  308*  ALT  --   --  300*  ALKPHOS  --   --  50  BILITOT  --   --  0.9   < > = values in this interval not displayed.   ------------------------------------------------------------------------------------------------------------------  Cardiac Enzymes No results for input(s): TROPONINI in the last 168 hours. ------------------------------------------------------------------------------------------------------------------  RADIOLOGY:  No results found.  ASSESSMENT AND PLAN:  27 year old male with known history of asthma, tobacco abuse presents to hospital secondary to an unresponsive state.  1. Acutecardiac arrest  Resolved  Concern for possible drug overdose, notedcardiac arrhythmia from electrolyte abnormalities likely   2. Acute renal failure and hyperkalemia Stable secondary to rhabdomyolysis Nephrology following, initially treated withCRRT-to changed to hemodialysis given clotting episodes  3. Rhabdomyolysis, acute Resolving Secondary topossible ischemic right leg with compartment syndrome. [facility]  4. Sepsis,  acute Resolved Treated with course of IV Unasyn/vancomycin  5. DIC, acute Improved Secondary to sepsis Receivedblood transfusion during this admission  6. Polysubstance abuse We will need counseling once sensorium improves.   All the records are reviewed and case discussed with Care Management/Social Workerr. Management plans discussed with the patient, family and they are in agreement.  CODE STATUS: partial  TOTAL TIME TAKING CARE OF THIS PATIENT: 35 minutes.     POSSIBLE D/C IN 3-5 DAYS, DEPENDING ON CLINICAL CONDITION.   Evelena AsaMontell D Caitlinn Klinker M.D on 11/09/2017   Between 7am to 6pm - Pager - [phone]  After 6pm go to [email or web address] - password [email or web address]  [facility]  Office  [phone]  CC: Primary care physician; Patient, No Pcp Per  Note: This dictation was prepared with Dragon dictation along with smaller phrase technology. Any transcriptional errors that result from this process are unintentional.

## 2017-11-09 NOTE — Progress Notes (Signed)
Chaplain continued follow up with Pt. Pt talked about the pain he is feeling (physicailly). He expressed remorse about his condition but wanted the pain to go away. The pt began to drift therefore the chaplain let the pt know he will be back tomorrow. I will continue to follow this pt as he need emotional support.    11/09/17 1500  Clinical Encounter Type  Visited With Patient  Visit Type Follow-up  Referral From Chaplain  Spiritual Encounters  Spiritual Needs Prayer

## 2017-11-10 ENCOUNTER — Inpatient Hospital Stay: Payer: Self-pay | Admitting: Anesthesiology

## 2017-11-10 ENCOUNTER — Encounter
Admission: EM | Disposition: A | Discharge status: Home Health | Payer: Self-pay | Source: Home / Self Care | Attending: Specialist

## 2017-11-10 DIAGNOSIS — I70261 Atherosclerosis of native arteries of extremities with gangrene, right leg: Secondary | ICD-10-CM

## 2017-11-10 HISTORY — PX: AMPUTATION: SHX166

## 2017-11-10 LAB — BASIC METABOLIC PANEL
Anion gap: 11 (ref 5–15)
BUN: 40 mg/dL — AB (ref 6–20)
CO2: 27 mmol/L (ref 22–32)
Calcium: 8.1 mg/dL — ABNORMAL LOW (ref 8.9–10.3)
Chloride: 100 mmol/L — ABNORMAL LOW (ref 101–111)
Creatinine, Ser: 4.69 mg/dL — ABNORMAL HIGH (ref 0.61–1.24)
GFR calc Af Amer: 18 mL/min — ABNORMAL LOW (ref >60)
GFR, EST NON AFRICAN AMERICAN: 16 mL/min — AB (ref >60)
GLUCOSE: 81 mg/dL (ref 65–99)
POTASSIUM: 3.6 mmol/L (ref 3.5–5.1)
Sodium: 138 mmol/L (ref 135–145)

## 2017-11-10 LAB — HEPATITIS B CORE ANTIBODY, TOTAL: HEP B C TOTAL AB: NEGATIVE

## 2017-11-10 LAB — CBC
HEMATOCRIT: 22.5 % — AB (ref 40.0–52.0)
Hemoglobin: 7.4 g/dL — ABNORMAL LOW (ref 13.0–18.0)
MCH: 30 pg (ref 26.0–34.0)
MCHC: 32.9 g/dL (ref 32.0–36.0)
MCV: 91.2 fL (ref 80.0–100.0)
Platelets: 371 10*3/uL (ref 150–440)
RBC: 2.47 MIL/uL — ABNORMAL LOW (ref 4.40–5.90)
RDW: 13.3 % (ref 11.5–14.5)
WBC: 27.9 10*3/uL — ABNORMAL HIGH (ref 3.8–10.6)

## 2017-11-10 LAB — APTT: aPTT: 34 seconds (ref 24–36)

## 2017-11-10 LAB — HEPATITIS B SURFACE ANTIGEN: HEP B S AG: NEGATIVE

## 2017-11-10 LAB — HEPATITIS B SURFACE ANTIBODY,QUALITATIVE: HEP B S AB: NONREACTIVE

## 2017-11-10 LAB — HEPATITIS C ANTIBODY

## 2017-11-10 LAB — PROTIME-INR
INR: 1.27
Prothrombin Time: 15.8 seconds — ABNORMAL HIGH (ref 11.4–15.2)

## 2017-11-10 SURGERY — AMPUTATION, ABOVE KNEE
Anesthesia: Choice | Laterality: Right | Wound class: Dirty or Infected

## 2017-11-10 MED ORDER — ONDANSETRON HCL 4 MG/2ML IJ SOLN: 4.0000 mg | Freq: Once | INTRAMUSCULAR | Status: DC | PRN

## 2017-11-10 MED ORDER — ONDANSETRON HCL 4 MG/2ML IJ SOLN
INTRAMUSCULAR | Status: AC
  Filled 2017-11-10: qty 2

## 2017-11-10 MED ORDER — HYDROMORPHONE HCL 1 MG/ML IJ SOLN
0.2500 mg | INTRAMUSCULAR | Status: DC | PRN
  Administered 2017-11-10 (×4): 0.5 mg via INTRAVENOUS

## 2017-11-10 MED ORDER — FENTANYL CITRATE (PF) 100 MCG/2ML IJ SOLN
25.0000 ug | INTRAMUSCULAR | Status: DC | PRN
  Administered 2017-11-10 (×4): 25 ug via INTRAVENOUS

## 2017-11-10 MED ORDER — FENTANYL CITRATE (PF) 100 MCG/2ML IJ SOLN
INTRAMUSCULAR | Status: AC
  Filled 2017-11-10: qty 2

## 2017-11-10 MED ORDER — FENTANYL CITRATE (PF) 100 MCG/2ML IJ SOLN
INTRAMUSCULAR | Status: AC
  Administered 2017-11-10: 25 ug via INTRAVENOUS
  Filled 2017-11-10: qty 2

## 2017-11-10 MED ORDER — MIDAZOLAM HCL 2 MG/2ML IJ SOLN
INTRAMUSCULAR | Status: DC | PRN
  Administered 2017-11-10: 2 mg via INTRAVENOUS

## 2017-11-10 MED ORDER — DEXAMETHASONE SODIUM PHOSPHATE 10 MG/ML IJ SOLN
INTRAMUSCULAR | Status: DC | PRN
  Administered 2017-11-10: 4 mg via INTRAVENOUS

## 2017-11-10 MED ORDER — LACTATED RINGERS IV SOLN
INTRAVENOUS | Status: DC | PRN
  Administered 2017-11-10: 16:00:00 via INTRAVENOUS

## 2017-11-10 MED ORDER — KETAMINE HCL 50 MG/ML IJ SOLN
INTRAMUSCULAR | Status: AC
  Filled 2017-11-10: qty 10

## 2017-11-10 MED ORDER — GLYCOPYRROLATE 0.2 MG/ML IJ SOLN
INTRAMUSCULAR | Status: AC
  Filled 2017-11-10: qty 1

## 2017-11-10 MED ORDER — ALBUMIN HUMAN 5 % IV SOLN
INTRAVENOUS | Status: AC
  Filled 2017-11-10: qty 500

## 2017-11-10 MED ORDER — ONDANSETRON HCL 4 MG/2ML IJ SOLN
INTRAMUSCULAR | Status: DC | PRN
  Administered 2017-11-10: 4 mg via INTRAVENOUS

## 2017-11-10 MED ORDER — LIDOCAINE HCL (PF) 2 % IJ SOLN
INTRAMUSCULAR | Status: AC
  Filled 2017-11-10: qty 10

## 2017-11-10 MED ORDER — HYDROMORPHONE HCL 1 MG/ML IJ SOLN
INTRAMUSCULAR | Status: AC
  Administered 2017-11-10: 0.5 mg via INTRAVENOUS
  Filled 2017-11-10: qty 1

## 2017-11-10 MED ORDER — SUCCINYLCHOLINE CHLORIDE 20 MG/ML IJ SOLN
INTRAMUSCULAR | Status: AC
  Filled 2017-11-10: qty 1

## 2017-11-10 MED ORDER — CEFAZOLIN SODIUM 1 G IJ SOLR
INTRAMUSCULAR | Status: AC
  Filled 2017-11-10: qty 20

## 2017-11-10 MED ORDER — FENTANYL CITRATE (PF) 100 MCG/2ML IJ SOLN
INTRAMUSCULAR | Status: DC | PRN
  Administered 2017-11-10 (×5): 50 ug via INTRAVENOUS

## 2017-11-10 MED ORDER — GABAPENTIN 100 MG PO CAPS
100.0000 mg | ORAL_CAPSULE | Freq: Three times a day (TID) | ORAL | Status: DC
  Administered 2017-11-10 – 2017-11-26 (×45): 100 mg via ORAL
  Filled 2017-11-10 (×46): qty 1

## 2017-11-10 MED ORDER — DEXAMETHASONE SODIUM PHOSPHATE 10 MG/ML IJ SOLN
INTRAMUSCULAR | Status: AC
  Filled 2017-11-10: qty 1

## 2017-11-10 MED ORDER — KETAMINE HCL 50 MG/ML IJ SOLN
INTRAMUSCULAR | Status: DC | PRN
  Administered 2017-11-10: 50 mg via INTRAVENOUS

## 2017-11-10 MED ORDER — MIDAZOLAM HCL 2 MG/2ML IJ SOLN
INTRAMUSCULAR | Status: AC
  Filled 2017-11-10: qty 2

## 2017-11-10 MED ORDER — LIDOCAINE HCL (CARDIAC) 20 MG/ML IV SOLN
INTRAVENOUS | Status: DC | PRN
  Administered 2017-11-10: 50 mg via INTRAVENOUS

## 2017-11-10 MED ORDER — METOPROLOL TARTRATE 5 MG/5ML IV SOLN
5.0000 mg | INTRAVENOUS | Status: AC
  Administered 2017-11-10: 5 mg via INTRAVENOUS
  Filled 2017-11-10: qty 5

## 2017-11-10 MED ORDER — GLYCOPYRROLATE 0.2 MG/ML IJ SOLN
INTRAMUSCULAR | Status: DC | PRN
  Administered 2017-11-10: 0.2 mg via INTRAVENOUS

## 2017-11-10 MED ORDER — PROPOFOL 10 MG/ML IV BOLUS
INTRAVENOUS | Status: DC | PRN
  Administered 2017-11-10: 150 mg via INTRAVENOUS

## 2017-11-10 SURGICAL SUPPLY — 53 items
BAG COUNTER SPONGE EZ (MISCELLANEOUS) IMPLANT
BANDAGE ELASTIC 4 LF NS (GAUZE/BANDAGES/DRESSINGS) ×3 IMPLANT
BANDAGE ELASTIC 6 LF NS (GAUZE/BANDAGES/DRESSINGS) ×3 IMPLANT
BLADE SAW GIGLI 510 (BLADE) ×2 IMPLANT
BLADE SAW GIGLI 510MM (BLADE) ×1
BLADE SAW SAG 25.4X90 (BLADE) ×3 IMPLANT
BLADE SURG SZ10 CARB STEEL (BLADE) ×3 IMPLANT
BNDG COHESIVE 4X5 TAN STRL (GAUZE/BANDAGES/DRESSINGS) ×3 IMPLANT
BNDG GAUZE 4.5X4.1 6PLY STRL (MISCELLANEOUS) ×6 IMPLANT
CANISTER SUCT 1200ML W/VALVE (MISCELLANEOUS) ×3 IMPLANT
CHLORAPREP W/TINT 26ML (MISCELLANEOUS) IMPLANT
COUNTER SPONGE BAG EZ (MISCELLANEOUS)
COVER LIGHT HANDLE STERIS (MISCELLANEOUS) ×3 IMPLANT
DRAPE INCISE IOBAN 66X45 STRL (DRAPES) ×3 IMPLANT
DRAPE INCISE IOBAN 66X60 STRL (DRAPES) ×3 IMPLANT
ELECT CAUTERY BLADE 6.4 (BLADE) ×3 IMPLANT
ELECT REM PT RETURN 9FT ADLT (ELECTROSURGICAL) ×3
ELECTRODE REM PT RTRN 9FT ADLT (ELECTROSURGICAL) ×1 IMPLANT
GAUZE FLUFF 18X24 1PLY STRL (GAUZE/BANDAGES/DRESSINGS) ×3 IMPLANT
GAUZE PETRO XEROFOAM 1X8 (MISCELLANEOUS) ×6 IMPLANT
GLOVE BIO SURGEON STRL SZ7 (GLOVE) ×6 IMPLANT
GLOVE BIOGEL PI IND STRL 7.5 (GLOVE) ×4 IMPLANT
GLOVE BIOGEL PI INDICATOR 7.5 (GLOVE) ×8
GLOVE INDICATOR 7.5 STRL GRN (GLOVE) ×6 IMPLANT
GLOVE SURG SYN 7.0 (GLOVE) ×6 IMPLANT
GLOVE SURG SYN 8.0 (GLOVE) ×9 IMPLANT
GOWN STRL REUS W/ TWL LRG LVL3 (GOWN DISPOSABLE) ×2 IMPLANT
GOWN STRL REUS W/ TWL XL LVL3 (GOWN DISPOSABLE) ×2 IMPLANT
GOWN STRL REUS W/TWL LRG LVL3 (GOWN DISPOSABLE) ×4
GOWN STRL REUS W/TWL XL LVL3 (GOWN DISPOSABLE) ×4
HANDLE YANKAUER SUCT BULB TIP (MISCELLANEOUS) ×3 IMPLANT
KIT TURNOVER KIT A (KITS) ×3 IMPLANT
MARKER SKIN W/RULER 31145785 (MISCELLANEOUS) ×3 IMPLANT
NS IRRIG 500ML POUR BTL (IV SOLUTION) ×3 IMPLANT
PACK EXTREMITY ARMC (MISCELLANEOUS) ×3 IMPLANT
PAD ABD DERMACEA PRESS 5X9 (GAUZE/BANDAGES/DRESSINGS) ×12 IMPLANT
PAD PREP 24X41 OB/GYN DISP (PERSONAL CARE ITEMS) ×3 IMPLANT
SOL PREP PVP 2OZ (MISCELLANEOUS) ×6
SOLUTION PREP PVP 2OZ (MISCELLANEOUS) ×2 IMPLANT
SPONGE LAP 18X18 5 PK (GAUZE/BANDAGES/DRESSINGS) ×15 IMPLANT
STAPLER SKIN PROX 35W (STAPLE) ×3 IMPLANT
STOCKINETTE M/LG 89821 (MISCELLANEOUS) ×3 IMPLANT
SUT ETHIBOND 0 36 GRN (SUTURE) IMPLANT
SUT SILK 2 0 (SUTURE)
SUT SILK 2-0 18XBRD TIE 12 (SUTURE) IMPLANT
SUT VIC AB 0 CT1 36 (SUTURE) ×18 IMPLANT
SUT VIC AB 3-0 SH 27 (SUTURE) ×10
SUT VIC AB 3-0 SH 27X BRD (SUTURE) ×5 IMPLANT
SUT VICRYL PLUS ABS 0 54 (SUTURE) ×3 IMPLANT
SUT VICRYL+ 3-0 36IN CT-1 (SUTURE) ×6 IMPLANT
SWAB CULTURE AMIES ANAERIB BLU (MISCELLANEOUS) IMPLANT
TAPE UMBIL 1/8X18 RADIOPA (MISCELLANEOUS) ×3 IMPLANT
TOWEL OR 17X26 4PK STRL BLUE (TOWEL DISPOSABLE) ×9 IMPLANT

## 2017-11-10 NOTE — Progress Notes (Signed)
[facility]  ROUNDING NOTE   Subjective:   Sleepy this morning Amputation revision planned Patient had low grade temp yesterday. Blood cultures were sent in dialysis No growth so far  Objective:  Vital signs in last 24 hours:  Temp:  [98.7 F (37.1 C)-99 F (37.2 C)] 98.9 F (37.2 C) (03/18 2145) Pulse Rate:  [84-118] 84 (03/18 2117) Resp:  [9-29] 20 (03/19 0400) BP: (128-160)/(51-96) 144/76 (03/19 0500) SpO2:  [93 %-100 %] 95 % (03/19 0400) Weight:  [74.5 kg (164 lb 3.9 oz)-75 kg (165 lb 5.5 oz)] 75 kg (165 lb 5.5 oz) (03/18 2117)  Weight change:  Filed Weights   11/08/17 0412 11/09/17 1700 11/09/17 2117  Weight: 77.9 kg (171 lb 11.8 oz) 74.5 kg (164 lb 3.9 oz) 75 kg (165 lb 5.5 oz)    Intake/Output: I/O last 3 completed shifts: In: 1042.4 [I.V.:1042.4] Out: 667 [Other:667]   Intake/Output this shift:  No intake/output data recorded.  Physical Exam: General: Ill appearing  Head: [place]/AT  Eyes:  Anicteric  Neck: Right IJ dialysis catheter,    Lungs:   Decreased breath sounds at bases otherwise clear, [place] O2  Heart:  Regular, tachycardic  Abdomen:  Soft, nontender  Extremities: Right amputation, left with edema, rt thigh blister noted  Neurologic:  Alert, able to follow commands     Access: RIJ temp HD catheter 3/8 Dr. Lonn Georgiaonforti    Basic Metabolic Panel: Recent Labs  Lab 11/04/17 2100  11/05/17 [id number]  11/05/17 2139 11/06/17 0221 11/06/17 [id number] 11/06/17 1511 11/06/17 2050 11/07/17 0351 11/08/17 0404 11/09/17 0444  NA 139   < > 140   < >  --  137 136 138 137 135 138 138  K 3.7   < > 4.4   < >  --  4.0 4.0 4.0 3.5 3.9 3.7 4.0  CL 106   < > 106   < >  --  103 102 102 105 103 102 104  CO2 24   < > 25   < >  --  28 24 26 26 26 23 23   GLUCOSE 90   < > 92   < >  --  125* 129* 89 78 94 73 107*  BUN 29*   < > 26*   < >  --  30* 30* 31* 29* 30* 29* 51*  CREATININE 2.25*   < > 2.25*   < >  --  2.19* 2.04* 2.12* 2.03* 2.26* 3.13* 5.33*  CALCIUM 7.9*   <  > 7.6*   < >  --  7.9* 8.0* 8.3* 8.0* 8.2* 8.4* 8.5*  MG 1.7  --  1.6*  --  2.0  --  1.8  --  1.7  --   --   --   PHOS 1.8*   < > 4.1   < >  --  3.0 3.1 2.0* 1.8* 1.9*  --   --    < > = values in this interval not displayed.    Liver Function Tests: Recent Labs  Lab 11/04/17 0102  11/06/17 0954 11/06/17 1511 11/06/17 2050 11/07/17 0351 11/09/17 0444  AST 1,253*  --   --   --   --   --  308*  ALT 485*  --   --   --   --   --  300*  ALKPHOS 73  --   --   --   --   --  50  BILITOT 0.7  --   --   --   --   --  0.9  PROT 5.5*  --   --   --   --   --  5.0*  ALBUMIN 2.5*   < > 2.2* 2.5* 2.1* 2.2* 2.2*   < > = values in this interval not displayed.   No results for input(s): LIPASE, AMYLASE in the last 168 hours. No results for input(s): AMMONIA in the last 168 hours.  CBC: Recent Labs  Lab 11/05/17 0946 11/06/17 0954 11/07/17 0351 11/08/17 0404 11/09/17 0444  WBC 15.9* 18.9* 20.4* 24.3* 30.0*  NEUTROABS 12.9* 16.4* 16.9* 19.2* 25.8*  HGB 8.1* 7.8* 8.3* 7.4* 7.0*  HCT 24.3* 23.3* 25.2* 22.7* 20.6*  MCV 90.9 91.5 91.5 92.9 90.2  PLT 131* 152 155 225 268    Cardiac Enzymes: Recent Labs  Lab 11/05/17 1630 11/06/17 0221 11/06/17 2050 11/07/17 0351 11/08/17 0404  CKTOTAL 16,006* 13,244* 7,941* 7,569* 7,699*    BNP: Invalid input(s): POCBNP  CBG: Recent Labs  Lab 11/08/17 1543 11/08/17 2016 11/08/17 2310 11/09/17 0400 11/09/17 0823  GLUCAP 95 101* 101* 89 85    Microbiology: Results for orders placed or performed during the hospital encounter of 10/30/17  MRSA PCR Screening     Status: None   Collection Time: 10/30/17  7:54 PM  Result Value Ref Range Status   MRSA by PCR NEGATIVE NEGATIVE Final    Comment:        The GeneXpert MRSA Assay (FDA approved for NASAL specimens only), is one component of a comprehensive MRSA colonization surveillance program. It is not intended to diagnose MRSA infection nor to guide or monitor treatment for MRSA  infections. Performed at [facility], [address] [place], [place] [address]   Culture, respiratory (NON-Expectorated)     Status: None   Collection Time: 11/05/17  8:17 AM  Result Value Ref Range Status   Specimen Description   Final    TRACHEAL ASPIRATE Performed at [facility], [address], [place], [place] [address]    Special Requests   Final    NONE Performed at [facility], [address] [place], [place] [address]    Gram Stain   Final    FEW WBC PRESENT, PREDOMINANTLY PMN MODERATE GRAM NEGATIVE RODS RARE YEAST Performed at [facility], [address], [place], [place] [address]    Culture MODERATE ENTEROBACTER CLOACAE  Final   Report Status 11/08/2017 FINAL  Final   Organism ID, Bacteria ENTEROBACTER CLOACAE  Final      Susceptibility   Enterobacter cloacae - MIC*    CEFAZOLIN >=64 RESISTANT Resistant     CEFEPIME <=1 SENSITIVE Sensitive     CEFTAZIDIME <=1 SENSITIVE Sensitive     CEFTRIAXONE <=1 SENSITIVE Sensitive     CIPROFLOXACIN <=0.25 SENSITIVE Sensitive     GENTAMICIN <=1 SENSITIVE Sensitive     IMIPENEM <=0.25 SENSITIVE Sensitive     TRIMETH/SULFA <=20 SENSITIVE Sensitive     PIP/TAZO <=4 SENSITIVE Sensitive     * MODERATE ENTEROBACTER CLOACAE  Culture, blood (routine x 2)     Status: None (Preliminary result)   Collection Time: 11/09/17  8:49 PM  Result Value Ref Range Status   Specimen Description BLOOD DIALYSIS  Final   Special Requests   Final    BOTTLES DRAWN AEROBIC AND ANAEROBIC Blood Culture adequate volume   Culture   Final    NO GROWTH < 12 HOURS Performed at [facility], [address], [place], [place] [address]    Report Status  PENDING  Incomplete    Coagulation Studies: No results for input(s): LABPROT, INR in the last 72 hours.  Urinalysis: No results for input(s): COLORURINE, LABSPEC, PHURINE, GLUCOSEU, HGBUR, BILIRUBINUR, KETONESUR, PROTEINUR, UROBILINOGEN, NITRITE,  LEUKOCYTESUR in the last 72 hours.  Invalid input(s): APPERANCEUR    Imaging: No results found.   Medications:   .  ceFAZolin (ANCEF) IV     . feeding supplement (ENSURE ENLIVE)  237 mL Oral TID BM  . gabapentin  300 mg Oral TID  . HYDROmorphone   Intravenous Q4H  . multivitamin with minerals  1 tablet Oral Daily  . nicotine  21 mg Transdermal Daily  . oxyCODONE  40 mg Oral Q12H  . QUEtiapine  25 mg Oral QHS  . sodium chloride flush  10-40 mL Intracatheter Q12H   ALPRAZolam, bisacodyl, dextrose, diphenhydrAMINE **OR** diphenhydrAMINE, docusate sodium, HYDROmorphone (DILAUDID) injection, naloxone **AND** sodium chloride flush, nystatin cream, ondansetron (ZOFRAN) IV, sennosides  Assessment/ Plan:  [name] is a 27 y.o. white male with asthma, bronchitis, polysubstance abuse, was admitted on3/8/2019after he was found down in a hotel for an unknown period of time in the fetal position. Hospital course includes prolonged CPR for 45 minutes in the emergency room. Ventricular fibrillation arrest, hypothermia protocol.  Urine tox screen positive for cocaine and opiates.  Hospital course complicated by acute respiratory failure and acute renal failure. Started on CRRT 3/8. Underwent disarticulation of right lower extremity due to ischemic limb. Rhabdomyolysis with sustained CPK greater than 50,000 for several days.Self extubated on 3/15. Transitioned to intermittent hemodialysis on 3/16.   1.  Acute renal failure:  Acute renal failure secondary to ATN, shock, hypotension and rhabdomyolysis.  still anuric.  Requiring renal replacement therapy.  Monitor for renal recovery. Continue to monitor volume status, urine output, renal function and electrolytes.  Renally dose all medications.  No response to iv lasix yesterday Next HD planned for wednesday  2.  Rhabdomyolysis: cocaine induced rhabdomyolysis. CPK trending down  3.  Right lower extremity ischemia: status post  disarticulation / Rt AKA by Dr. Gilda Crease on 3/12.   Revision planned today Decrease dose of Neurontin (renal failure)- at risk for neurotoxicity May increase dose over time slowly     LOS: 11 Ilia Dimaano 3/19/20198:30 AM

## 2017-11-10 NOTE — Anesthesia Procedure Notes (Signed)
Procedure Name: LMA Insertion Performed by: Kayler Rise, CRNA Pre-anesthesia Checklist: Patient identified, Patient being monitored, Timeout performed, Emergency Drugs available and Suction available Patient Re-evaluated:Patient Re-evaluated prior to induction Oxygen Delivery Method: Circle system utilized Preoxygenation: Pre-oxygenation with 100% oxygen Induction Type: IV induction LMA: LMA inserted LMA Size: 4.5 Tube type: Oral Number of attempts: 1 Placement Confirmation: positive ETCO2 and breath sounds checked- equal and bilateral Tube secured with: Tape Dental Injury: Teeth and Oropharynx as per pre-operative assessment        

## 2017-11-10 NOTE — Op Note (Signed)
  [facility]   OPERATIVE NOTE   PROCEDURE:  Right above-the-knee amputation  PRE-OPERATIVE DIAGNOSIS: Right foot gangrene  POST-OPERATIVE DIAGNOSIS: same as above  SURGEON:  Renford DillsGregory G Schnier, MD  ASSISTANT(S): Ms. Raul DelKim Stegmayer  ANESTHESIA: general  ESTIMATED BLOOD LOSS: 300 cc  FINDING(S): Muscle bellies in the lateral aspect a pink color medial muscle bellies particularly the quadriceps are a more red color.  Medial muscle bellies react much better to Bovie excellent bleeding throughout all muscle bellies  SPECIMEN(S):  Right above-the-knee amputation  INDICATIONS:   Joshua [name] is a 27 y.o. male who presents with right leg gangrene.  Approximately 1 week ago he underwent urgent right knee disarticulation secondary to rhabdomyolysis and deterioration of his overall condition.  He is now extubated and talking.  He remains on dialysis.  Therefore, the patient is scheduled for a right above-the-knee amputation.  I discussed in depth with the patient and his mother the risks, benefits, and alternatives to this procedure.  The patient is aware that the risk of this operation included but are not limited to:  bleeding, infection, myocardial infarction, stroke, death, failure to heal amputation wound, and possible need for more proximal amputation.  The patient is aware of the risks and agrees proceed forward with the procedure.  DESCRIPTION: After full informed written consent was obtained from the patient, the patient was taken to the operating room, and placed supine upon the operating table.  Prior to induction, the patient received IV antibiotics.  The patient was then prepped and draped in the standard fashion for a right above-the-knee amputation.  After obtaining adequate anesthesia, the patient was prepped and draped in the standard fashion for a above-the-knee amputation.  I marked out the anterior and posterior flaps for a fish-mouth type of amputation.  I  made the incisions for these flaps, and then dissected through the subcutaneous tissue, fascia, and muscles circumferentially.  I elevated  the periosteal tissue 4-5 cm more proximal than the anterior skin flap.  I then transected the femur with a power saw at this level.  Then I smoothed out the rough edges of the bone.  At this point, the specimen was passed off the field as the above-the-knee amputation.  At this point, I clamped all visibly bleeding arteries and veins using a combination of suture ligation with Vicryl suture and electrocautery.   Bleeding continued to be controlled with electrocautery and suture ligature.  The stump was washed off with sterile normal saline and no further active bleeding was noted.  I reapproximated the anterior and posterior fascia  with interrupted stitches of 0 Vicryl.  This was completed along the entire length of anterior and posterior fascia until there were no more loose space in the fascial line. The skin was then  reapproximated with staples.  The stump was washed off and dried.  The incision was dressed with Xeroform and ABD pads, and  then fluffs were applied.  Kerlix was wrapped around the leg and then gently an ACE wrap was applied.  A large Ioban was then placed over the ACE wrap to secure the dressing. The patient was then awakened and take to the recovery room in stable condition.   COMPLICATIONS: none  CONDITION: stable  Joshua [name]  11/10/2017, 6:29 PM   This note was created with Dragon Medical transcription system. Any errors in dictation are purely unintentional.

## 2017-11-10 NOTE — Progress Notes (Signed)
[facility] [place] [facility]   PATIENT NAME: Joshua PlanasMark [name]    MR#:  [id number]  DATE OF BIRTH:  12/06/1990  SUBJECTIVE:  CHIEF COMPLAINT:   Chief Complaint  Patient presents with  . Drug Overdose  For continued renal replacement therapy, nephrology input appreciated  REVIEW OF SYSTEMS:  CONSTITUTIONAL: No fever, fatigue or weakness.  EYES: No blurred or double vision.  EARS, NOSE, AND THROAT: No tinnitus or ear pain.  RESPIRATORY: No cough, shortness of breath, wheezing or hemoptysis.  CARDIOVASCULAR: No chest pain, orthopnea, edema.  GASTROINTESTINAL: No nausea, vomiting, diarrhea or abdominal pain.  GENITOURINARY: No dysuria, hematuria.  ENDOCRINE: No polyuria, nocturia,  HEMATOLOGY: No anemia, easy bruising or bleeding SKIN: No rash or lesion. MUSCULOSKELETAL: No joint pain or arthritis.   NEUROLOGIC: No tingling, numbness, weakness.  PSYCHIATRY: No anxiety or depression.   ROS  DRUG ALLERGIES:  No Known Allergies  VITALS:  Blood pressure 136/77, pulse 84, temperature 98.7 F (37.1 C), temperature source Oral, resp. rate 12, height 5\' 10"  (1.778 m), weight 75 kg (165 lb 5.5 oz), SpO2 97 %.  PHYSICAL EXAMINATION:  GENERAL:  27 y.o.-year-old patient lying in the bed with no acute distress.  EYES: Pupils equal, round, reactive to light and accommodation. No scleral icterus. Extraocular muscles intact.  HEENT: Head atraumatic, normocephalic. Oropharynx and nasopharynx clear.  NECK:  Supple, no jugular venous distention. No thyroid enlargement, no tenderness.  LUNGS: Normal breath sounds bilaterally, no wheezing, rales,rhonchi or crepitation. No use of accessory muscles of respiration.  CARDIOVASCULAR: S1, S2 normal. No murmurs, rubs, or gallops.  ABDOMEN: Soft, nontender, nondistended. Bowel sounds present. No organomegaly or mass.  EXTREMITIES: No pedal edema, cyanosis, or clubbing.  NEUROLOGIC: Cranial nerves II through XII are intact. Muscle strength  5/5 in all extremities. Sensation intact. Gait not checked.  PSYCHIATRIC: The patient is alert and oriented x 3.  SKIN: No obvious rash, lesion, or ulcer.   Physical Exam LABORATORY PANEL:   CBC Recent Labs  Lab 11/10/17 0918  WBC 27.9*  HGB 7.4*  HCT 22.5*  PLT 371   ------------------------------------------------------------------------------------------------------------------  Chemistries  Recent Labs  Lab 11/06/17 2050  11/09/17 0444 11/10/17 0918  NA 137   < > 138 138  K 3.5   < > 4.0 3.6  CL 105   < > 104 100*  CO2 26   < > 23 27  GLUCOSE 78   < > 107* 81  BUN 29*   < > 51* 40*  CREATININE 2.03*   < > 5.33* 4.69*  CALCIUM 8.0*   < > 8.5* 8.1*  MG 1.7  --   --   --   AST  --   --  308*  --   ALT  --   --  300*  --   ALKPHOS  --   --  50  --   BILITOT  --   --  0.9  --    < > = values in this interval not displayed.   ------------------------------------------------------------------------------------------------------------------  Cardiac Enzymes No results for input(s): TROPONINI in the last 168 hours. ------------------------------------------------------------------------------------------------------------------  RADIOLOGY:  No results found.  ASSESSMENT AND PLAN:  27 year old male with known history of asthma, tobacco abuse presents to hospital secondary to an unresponsive state.  1. Acutecardiac arrest  Resolved  Concern for possible drug overdose, notedcardiac arrhythmia from electrolyte abnormalities likely   2. Acute renal failure and hyperkalemia Stable secondary to rhabdomyolysis Nephrology following, continue renal  replacement therapy, next treatment is on Wednesday  3. Rhabdomyolysis, acute Resolving Secondary topossible ischemic right leg with compartment syndrome. Vascular surgery following,s/p right leg disarticulation at the [facility] 11/03/17  4. Sepsis, acute Resolved Treated with course of IV Unasyn/vancomycin  5.  DIC, acute Resolved Secondary to sepsis Receivedblood transfusion during this admission  6. Polysubstance abuse We will need counseling once sensorium improves  7.  Acute right lower extremity ischemia  Status post disarticulation/right AKA by Dr. Gilda Crease on 3/12 For revision to AKA later today    All the records are reviewed and case discussed with Care Management/Social Workerr. Management plans discussed with the patient, family and they are in agreement.  CODE STATUS: partial  TOTAL TIME TAKING CARE OF THIS PATIENT: 35 minutes.     POSSIBLE D/C IN 3-5 DAYS, DEPENDING ON CLINICAL CONDITION.   Evelena Asa Salary M.D on 11/10/2017   Between 7am to 6pm - Pager - [phone]  After 6pm go to [email or web address] - password [email or web address]  [facility]  Office  [phone]  CC: Primary care physician; Patient, No Pcp Per  Note: This dictation was prepared with Dragon dictation along with smaller phrase technology. Any transcriptional errors that result from this process are unintentional.

## 2017-11-10 NOTE — Progress Notes (Addendum)
Daily Progress Note   Patient Name: Levonne HubertMark A Adachi       Date: 11/10/2017 DOB: 01/07/1991  Age: 27 y.o. MRN#: [id number] Attending Physician: Bertrum SolSalary, Montell D, MD Primary Care Physician: Patient, No Pcp Per Admit Date: 10/30/2017  Reason for Consultation/Follow-up: Establishing goals of care  Subjective: Patient resting in bed, mother at bedside. Patient without complaints to this Clinical research associatewriter. Mother is optimistic and hopeful, patient is hopeful as well. Plans for OR today for revision of AKA. Patient is looking forward to rehab and a prosthesis.   Continuing M/W/F dialysis.    Length of Stay: 11  Current Medications: Scheduled Meds:  . feeding supplement (ENSURE ENLIVE)  237 mL Oral TID BM  . gabapentin  100 mg Oral TID  . HYDROmorphone   Intravenous Q4H  . multivitamin with minerals  1 tablet Oral Daily  . nicotine  21 mg Transdermal Daily  . oxyCODONE  40 mg Oral Q12H  . QUEtiapine  25 mg Oral QHS  . sodium chloride flush  10-40 mL Intracatheter Q12H    Continuous Infusions: .  ceFAZolin (ANCEF) IV      PRN Meds: ALPRAZolam, bisacodyl, dextrose, diphenhydrAMINE **OR** diphenhydrAMINE, docusate sodium, HYDROmorphone (DILAUDID) injection, naloxone **AND** sodium chloride flush, nystatin cream, ondansetron (ZOFRAN) IV, sennosides  Physical Exam  Constitutional: No distress.  Pulmonary/Chest: Effort normal.  Neurological: He is alert.  Skin: Skin is warm and dry.            Vital Signs: BP 136/77 (BP Location: Left Arm)   Pulse 84   Temp 98.7 F (37.1 C) (Oral)   Resp 16   Ht 5\' 10"  (1.778 m)   Wt 75 kg (165 lb 5.5 oz)   SpO2 98%   BMI 23.72 kg/m  SpO2: SpO2: 98 % O2 Device: O2 Device: Room Air O2 Flow Rate: O2 Flow Rate (L/min): 2 L/min  Intake/output summary:    Intake/Output Summary (Last 24 hours) at 11/10/2017 1343 Last data filed at 11/09/2017 2117 Gross per 24 hour  Intake 339.5 ml  Output 667 ml  Net -327.5 ml   LBM: Last BM Date: 11/05/17 Baseline Weight: Weight: 74.8 kg (165 lb) Most recent weight: Weight: 75 kg (165 lb 5.5 oz)       Palliative Assessment/Data: 40%    Flowsheet Rows  Most Recent Value  Intake Tab  Referral Department  Hospitalist  Unit at Time of Referral  Med/Surg Unit  Palliative Care Primary Diagnosis  Other (Comment) [multiporgan failure]  Date Notified  11/04/17  Palliative Care Type  New Palliative care  Reason for referral  Clarify Goals of Care  Date of Admission  10/30/17  Date first seen by Palliative Care  11/04/17  # of days Palliative referral response time  0 Day(s)  # of days IP prior to Palliative referral  5  Clinical Assessment  Psychosocial & Spiritual Assessment  Palliative Care Outcomes      Patient Active Problem List   Diagnosis Date Noted  . Acute kidney failure, unspecified (HCC) 11/02/2017  . Rhabdomyolysis 11/02/2017  . Acute respiratory failure (HCC) 11/02/2017  . Cardiogenic shock (HCC) 10/30/2017  . Cardiac arrest (HCC) 10/30/2017  . Drug abuse (HCC) 10/30/2017    Palliative Care Assessment & Plan   Patient Profile: 27 year old gentleman ith a past medical history of polysubstance abuse, status post prolonged cardiac arrest, respiratory failure, renal failure, rhabdomyolysis, shock.  Assessment/ Recommendations/Plan: Improving  Continuing current care at this time. OR today with Vascular Surgery   Code Status:    Code Status Orders  (From admission, onward)        Start     Ordered   11/04/17 1543  Limited resuscitation (code)  Continuous    Question Answer Comment  In the event of cardiac or respiratory ARREST: Initiate Code Blue, Call Rapid Response Yes   In the event of cardiac or respiratory ARREST: Perform CPR No   In the event of cardiac or  respiratory ARREST: Perform Intubation/Mechanical Ventilation Yes   In the event of cardiac or respiratory ARREST: Use NIPPV/BiPAp only if indicated Yes   In the event of cardiac or respiratory ARREST: Administer ACLS medications if indicated Yes   In the event of cardiac or respiratory ARREST: Perform Defibrillation or Cardioversion if indicated No   Comments patient already intubated. Continue current intubation.      11/04/17 1544    Code Status History    Date Active Date Inactive Code Status Order ID Comments User Context   10/30/2017 21:10 11/04/2017 15:44 Full Code [id number]  Lewie Loron, NP Inpatient   10/30/2017 18:13 10/30/2017 21:10 Full Code [id number]  Altamese Dilling, MD Inpatient       Prognosis:  Guarded. Patient is improving.  Discharge Planning:  To Be Determined   Thank you for allowing the Palliative Medicine Team to assist in the care of this patient.   Total Time 35 min Prolonged Time Billed  NO       Greater than 50%  of this time was spent counseling and coordinating care related to the above assessment and plan.  Morton Stall, NP  Please contact Palliative Medicine Team phone at [phone] for questions and concerns.

## 2017-11-10 NOTE — Anesthesia Preprocedure Evaluation (Signed)
Anesthesia Evaluation  Patient identified by MRN, date of birth, ID band Patient awake    Reviewed: Allergy & Precautions, H&P , NPO status , Patient's Chart, lab work & pertinent test results, reviewed documented beta blocker date and time   Airway Mallampati: II  TM Distance: >3 FB Neck ROM: full    Dental  (+) Teeth Intact   Pulmonary neg pulmonary ROS, asthma , Current Smoker,    Pulmonary exam normal        Cardiovascular Exercise Tolerance: Good negative cardio ROS Normal cardiovascular exam Rate:Normal     Neuro/Psych negative neurological ROS  negative psych ROS   GI/Hepatic negative GI ROS, Neg liver ROS,   Endo/Other  negative endocrine ROS  Renal/GU Renal diseasenegative Renal ROS  negative genitourinary   Musculoskeletal   Abdominal   Peds  Hematology negative hematology ROS (+)   Anesthesia Other Findings   Reproductive/Obstetrics negative OB ROS                             Anesthesia Physical Anesthesia Plan  ASA: IV and emergent  Anesthesia Plan: General LMA   Post-op Pain Management:    Induction:   PONV Risk Score and Plan:   Airway Management Planned:   Additional Equipment:   Intra-op Plan:   Post-operative Plan:   Informed Consent: I have reviewed the patients History and Physical, chart, labs and discussed the procedure including the risks, benefits and alternatives for the proposed anesthesia with the patient or authorized representative who has indicated his/her understanding and acceptance.     Plan Discussed with: CRNA  Anesthesia Plan Comments:         Anesthesia Quick Evaluation

## 2017-11-10 NOTE — Anesthesia Post-op Follow-up Note (Signed)
Anesthesia QCDR form completed.        

## 2017-11-10 NOTE — Progress Notes (Signed)
Pt profile: [age] M with hx of polysubstance abuse adm 03/28 after found in hotel room unresponsive and suffered VF arrest during transport by EMS to ED. UDS positive for cocaine and opiates.  Found to have ischemic RLE with severe rhabdomyolysis.  Underwent RLE disarticulation at the knee 03/12.  Self extubated 03/15.  ICU course complicated by anuric renal failure requiring hemodialysis and chronic pain syndrome with extremely high opiate tolerance  Echo 03/09: LVEF 35-40%  Subj: On initial evaluation, patient was somnolent and appeared to be in no distress.  Presently, more alert with no evidence of distress or pain.  Obj: Vitals:   11/10/17 0900 11/10/17 1207  BP: 136/77   Pulse:    Resp: 12 16  Temp: 98.7 F (37.1 C)   SpO2: 97% 98%    Gen: NAD HEENT: NCAT, poor dentition, sclerae white Neck: No JVD noted Chest: Clear anteriorly Cardiac: Reg, no M ZOX:WRUEAbd:NABS, soft Ext: RLE amputation. Wound dressed. No LLE edema  I have repeated physical exam and reviewed all aspects of the exam as documented above with no significant changes to be made  BMP Latest Ref Rng & Units 11/10/2017 11/09/2017 11/08/2017  Glucose 65 - 99 mg/dL 81 454(U107(H) 73  BUN 6 - 20 mg/dL 98(J40(H) 19(J51(H) 47(W29(H)  Creatinine 0.61 - 1.24 mg/dL 2.95(A4.69(H) 2.13(Y5.33(H) 8.65(H3.13(H)  Sodium 135 - 145 mmol/L 138 138 138  Potassium 3.5 - 5.1 mmol/L 3.6 4.0 3.7  Chloride 101 - 111 mmol/L 100(L) 104 102  CO2 22 - 32 mmol/L 27 23 23   Calcium 8.9 - 10.3 mg/dL 8.1(L) 8.5(L) 8.4(L)     CBC Latest Ref Rng & Units 11/10/2017 11/09/2017 11/08/2017  WBC 3.8 - 10.6 K/uL 27.9(H) 30.0(H) 24.3(H)  Hemoglobin 13.0 - 18.0 g/dL 7.4(L) 7.0(L) 7.4(L)  Hematocrit 40.0 - 52.0 % 22.5(L) 20.6(L) 22.7(L)  Platelets 150 - 440 K/uL 371 268 225      CXR: No new film   IMPRESSION: Status post prolonged cardio respiratory arrest due to drug overdose Dilated cardiomyopathy (LVEF 35-40%) 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  No significant urine output with trial of furosemide 03/18 Polysubstance abuse Chronic pain syndrome Extremely high opiate tolerance  Leukocytosis of unclear etiology  PLAN/RECS:  Cont SDU status.  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  Plan for revision later this week   Billy Fischeravid Tejon Gracie, MD PCCM service Mobile [phone] Pager [phone] 11/10/2017 3:09 PM

## 2017-11-10 NOTE — Progress Notes (Signed)
Chaplain followed up from a the Pt. Pt smiled when Chaplain entered the rooom. Mother was at the bedside. Chaplain spoke with both and told Pt he would visit Thursday to have another conversation.    11/10/17 1300  Clinical Encounter Type  Visited With Patient and family together  Visit Type Follow-up  Spiritual Encounters  Spiritual Needs Prayer;Emotional

## 2017-11-10 NOTE — Transfer of Care (Signed)
Immediate Anesthesia Transfer of Care Note  Patient: Joshua [name]  Procedure(s) Performed: AMPUTATION ABOVE KNEE (Right )  Patient Location: PACU  Anesthesia Type:General  Level of Consciousness: sedated and responds to stimulation  Airway & Oxygen Therapy: Patient Spontanous Breathing and Patient connected to face mask oxygen  Post-op Assessment: Report given to RN and Post -op Vital signs reviewed and stable  Post vital signs: Reviewed and stable  Last Vitals:  Vitals:   11/10/17 1500 11/10/17 1822  BP: (!) 142/81 (!) 147/82  Pulse:  95  Resp: 17 15  Temp:    SpO2: 97% 100%    Last Pain:  Vitals:   11/10/17 1353  TempSrc:   PainSc: 6       Patients Stated Pain Goal: 0 (11/10/17 0900)  Complications: No apparent anesthesia complications

## 2017-11-11 ENCOUNTER — Encounter: Payer: Self-pay | Admitting: Vascular Surgery

## 2017-11-11 DIAGNOSIS — T50904A Poisoning by unspecified drugs, medicaments and biological substances, undetermined, initial encounter: Secondary | ICD-10-CM

## 2017-11-11 DIAGNOSIS — D649 Anemia, unspecified: Secondary | ICD-10-CM

## 2017-11-11 LAB — PREPARE RBC (CROSSMATCH)

## 2017-11-11 LAB — CBC
HCT: 19.6 % — ABNORMAL LOW (ref 40.0–52.0)
Hemoglobin: 6.7 g/dL — ABNORMAL LOW (ref 13.0–18.0)
MCH: 29.6 pg (ref 26.0–34.0)
MCHC: 34 g/dL (ref 32.0–36.0)
MCV: 86.9 fL (ref 80.0–100.0)
PLATELETS: 407 10*3/uL (ref 150–440)
RBC: 2.25 MIL/uL — ABNORMAL LOW (ref 4.40–5.90)
RDW: 15.3 % — ABNORMAL HIGH (ref 11.5–14.5)
WBC: 30.1 10*3/uL — ABNORMAL HIGH (ref 3.8–10.6)

## 2017-11-11 LAB — BASIC METABOLIC PANEL
Anion gap: 11 (ref 5–15)
BUN: 56 mg/dL — ABNORMAL HIGH (ref 6–20)
CALCIUM: 7.8 mg/dL — AB (ref 8.9–10.3)
CO2: 25 mmol/L (ref 22–32)
CREATININE: 6.35 mg/dL — AB (ref 0.61–1.24)
Chloride: 99 mmol/L — ABNORMAL LOW (ref 101–111)
GFR calc non Af Amer: 11 mL/min — ABNORMAL LOW (ref >60)
GFR, EST AFRICAN AMERICAN: 13 mL/min — AB (ref >60)
Glucose, Bld: 114 mg/dL — ABNORMAL HIGH (ref 65–99)
Potassium: 4.5 mmol/L (ref 3.5–5.1)
SODIUM: 135 mmol/L (ref 135–145)

## 2017-11-11 LAB — MAGNESIUM: MAGNESIUM: 2.1 mg/dL (ref 1.7–2.4)

## 2017-11-11 LAB — PHOSPHORUS: PHOSPHORUS: 5.2 mg/dL — AB (ref 2.5–4.6)

## 2017-11-11 LAB — PROTIME-INR
INR: 1.29
PROTHROMBIN TIME: 16 s — AB (ref 11.4–15.2)

## 2017-11-11 MED ORDER — HYDROMORPHONE HCL 1 MG/ML IJ SOLN
1.0000 mg | Freq: Once | INTRAMUSCULAR | Status: AC
  Administered 2017-11-11: 1 mg via INTRAVENOUS
  Filled 2017-11-11: qty 1

## 2017-11-11 MED ORDER — OXYCODONE HCL ER 15 MG PO T12A
80.0000 mg | EXTENDED_RELEASE_TABLET | Freq: Two times a day (BID) | ORAL | Status: DC
  Administered 2017-11-11 – 2017-11-19 (×18): 80 mg via ORAL
  Filled 2017-11-11 (×4): qty 4
  Filled 2017-11-11: qty 2
  Filled 2017-11-11 (×13): qty 4

## 2017-11-11 MED ORDER — MORPHINE SULFATE (PF) 2 MG/ML IV SOLN
2.0000 mg | INTRAVENOUS | Status: DC | PRN
  Administered 2017-11-11: 4 mg via INTRAVENOUS
  Administered 2017-11-11 (×2): 2 mg via INTRAVENOUS
  Administered 2017-11-12: 4 mg via INTRAVENOUS
  Administered 2017-11-12 (×2): 2 mg via INTRAVENOUS
  Administered 2017-11-12 (×2): 4 mg via INTRAVENOUS
  Administered 2017-11-13 (×3): 2 mg via INTRAVENOUS
  Administered 2017-11-14: 4 mg via INTRAVENOUS
  Administered 2017-11-14: 2 mg via INTRAVENOUS
  Administered 2017-11-15: 4 mg via INTRAVENOUS
  Administered 2017-11-15: 2 mg via INTRAVENOUS
  Administered 2017-11-15 (×2): 4 mg via INTRAVENOUS
  Administered 2017-11-16 (×3): 2 mg via INTRAVENOUS
  Administered 2017-11-17 – 2017-11-18 (×2): 4 mg via INTRAVENOUS
  Administered 2017-11-19 (×2): 2 mg via INTRAVENOUS
  Filled 2017-11-11: qty 1
  Filled 2017-11-11: qty 2
  Filled 2017-11-11 (×3): qty 1
  Filled 2017-11-11 (×3): qty 2
  Filled 2017-11-11 (×5): qty 1
  Filled 2017-11-11 (×6): qty 2
  Filled 2017-11-11 (×3): qty 1
  Filled 2017-11-11: qty 2

## 2017-11-11 MED ORDER — TUBERCULIN PPD 5 UNIT/0.1ML ID SOLN
5.0000 [IU] | Freq: Once | INTRADERMAL | Status: AC
  Administered 2017-11-11: 5 [IU] via INTRADERMAL
  Filled 2017-11-11: qty 0.1

## 2017-11-11 NOTE — Progress Notes (Signed)
Daily Progress Note   Patient Name: Joshua [name]       Date: 11/11/2017 DOB: [birth year]  Age: 27 y.o. MRN#: [id number] Attending Physician: Bertrum Sol, MD Primary Care Physician: Patient, No Pcp Per Admit Date: 10/30/2017  Reason for Consultation/Follow-up: Establishing goals of care  Subjective: Patient resting in bed, mother at bedside. R AKA yesterday. Patient happy he is moving from ICU today to regular bed.    Continuing M/W/F dialysis.    Length of Stay: 12  Current Medications: Scheduled Meds:  . feeding supplement (ENSURE ENLIVE)  237 mL Oral TID BM  . gabapentin  100 mg Oral TID  . multivitamin with minerals  1 tablet Oral Daily  . nicotine  21 mg Transdermal Daily  . oxyCODONE  80 mg Oral Q12H  . QUEtiapine  25 mg Oral QHS  . sodium chloride flush  10-40 mL Intracatheter Q12H  . tuberculin  5 Units Intradermal Once    Continuous Infusions:   PRN Meds: ALPRAZolam, bisacodyl, dextrose, docusate sodium, nystatin cream, sennosides  Physical Exam  Constitutional: He appears well-developed. No distress.  Pulmonary/Chest: Effort normal.  Neurological: He is alert.  Skin: Skin is warm and dry.            Vital Signs: BP (!) 146/91   Pulse 96   Temp 98.9 F (37.2 C) (Oral)   Resp 20   Ht 5\' 10"  (1.778 m)   Wt 75 kg (165 lb 5.5 oz)   SpO2 100%   BMI 23.72 kg/m  SpO2: SpO2: 100 % O2 Device: O2 Device: Room Air O2 Flow Rate: O2 Flow Rate (L/min): 2 L/min  Intake/output summary:   Intake/Output Summary (Last 24 hours) at 11/11/2017 1137 Last data filed at 11/10/2017 1807 Gross per 24 hour  Intake 1025 ml  Output 250 ml  Net 775 ml   LBM: Last BM Date: 11/05/17 Baseline Weight: Weight: 74.8 kg (165 lb) Most recent weight: Weight: 75 kg (165 lb 5.5  oz)       Palliative Assessment/Data: 40%    Flowsheet Rows     Most Recent Value  Intake Tab  Referral Department  Hospitalist  Unit at Time of Referral  Med/Surg Unit  Palliative Care Primary Diagnosis  Other (Comment) [multiporgan failure]  Date Notified  11/04/17  Palliative Care  Type  New Palliative care  Reason for referral  Clarify Goals of Care  Date of Admission  10/30/17  Date first seen by Palliative Care  11/04/17  # of days Palliative referral response time  0 Day(s)  # of days IP prior to Palliative referral  5  Clinical Assessment  Psychosocial & Spiritual Assessment  Palliative Care Outcomes      Patient Active Problem List   Diagnosis Date Noted  . Acute kidney failure, unspecified (HCC) 11/02/2017  . Rhabdomyolysis 11/02/2017  . Acute respiratory failure (HCC) 11/02/2017  . Cardiogenic shock (HCC) 10/30/2017  . Cardiac arrest (HCC) 10/30/2017  . Drug abuse (HCC) 10/30/2017    Palliative Care Assessment & Plan   Patient Profile: 27 year old gentleman ith a past medical history of polysubstance abuse, status post prolonged cardiac arrest, respiratory failure, renal failure, rhabdomyolysis, shock.  Assessment/ Recommendations/Plan: Improving. Continuing current care.    Code Status:    Code Status Orders  (From admission, onward)        Start     Ordered   11/04/17 1543  Limited resuscitation (code)  Continuous    Question Answer Comment  In the event of cardiac or respiratory ARREST: Initiate Code Blue, Call Rapid Response Yes   In the event of cardiac or respiratory ARREST: Perform CPR No   In the event of cardiac or respiratory ARREST: Perform Intubation/Mechanical Ventilation Yes   In the event of cardiac or respiratory ARREST: Use NIPPV/BiPAp only if indicated Yes   In the event of cardiac or respiratory ARREST: Administer ACLS medications if indicated Yes   In the event of cardiac or respiratory ARREST: Perform Defibrillation or  Cardioversion if indicated No   Comments patient already intubated. Continue current intubation.      11/04/17 1544    Code Status History    Date Active Date Inactive Code Status Order ID Comments User Context   10/30/2017 21:10 11/04/2017 15:44 Full Code [id number]  Lewie Loronukov, Magadalene S, NP Inpatient   10/30/2017 18:13 10/30/2017 21:10 Full Code [id number]  Altamese DillingVachhani, Vaibhavkumar, MD Inpatient       Prognosis:   Patient is improving.  Discharge Planning:  To Be Determined   Thank you for allowing the Palliative Medicine Team to assist in the care of this patient.   Total Time 15 min Prolonged Time Billed  NO       Greater than 50%  of this time was spent counseling and coordinating care related to the above assessment and plan.  Morton Stallrystal Katherene Dinino, NP  Please contact Palliative Medicine Team phone at [phone] for questions and concerns.

## 2017-11-11 NOTE — Progress Notes (Signed)
[employer] [facility] [facility] [place] [facility]   PATIENT NAME: Joshua [name]    MR#:  [id number]  DATE OF BIRTH:  [birth year]  SUBJECTIVE:  CHIEF COMPLAINT:   Chief Complaint  Patient presents with  . Drug Overdose  No events overnight, case discussed with intensivist-to be transferred to the floor later today, hemodialysis later today, palliative care following  REVIEW OF SYSTEMS:  CONSTITUTIONAL: No fever, fatigue or weakness.  EYES: No blurred or double vision.  EARS, NOSE, AND THROAT: No tinnitus or ear pain.  RESPIRATORY: No cough, shortness of breath, wheezing or hemoptysis.  CARDIOVASCULAR: No chest pain, orthopnea, edema.  GASTROINTESTINAL: No nausea, vomiting, diarrhea or abdominal pain.  GENITOURINARY: No dysuria, hematuria.  ENDOCRINE: No polyuria, nocturia,  HEMATOLOGY: No anemia, easy bruising or bleeding SKIN: No rash or lesion. MUSCULOSKELETAL: No joint pain or arthritis.   NEUROLOGIC: No tingling, numbness, weakness.  PSYCHIATRY: No anxiety or depression.   ROS  DRUG ALLERGIES:  No Known Allergies  VITALS:  Blood pressure (!) 149/88, pulse 92, temperature 98.6 F (37 C), temperature source Oral, resp. rate 17, height 5\' 10"  (1.778 m), weight 75 kg (165 lb 5.5 oz), SpO2 92 %.  PHYSICAL EXAMINATION:  GENERAL:  27 y.o.-year-old patient lying in the bed with no acute distress.  EYES: Pupils equal, round, reactive to light and accommodation. No scleral icterus. Extraocular muscles intact.  HEENT: Head atraumatic, normocephalic. Oropharynx and nasopharynx clear.  NECK:  Supple, no jugular venous distention. No thyroid enlargement, no tenderness.  LUNGS: Normal breath sounds bilaterally, no wheezing, rales,rhonchi or crepitation. No use of accessory muscles of respiration.  CARDIOVASCULAR: S1, S2 normal. No murmurs, rubs, or gallops.  ABDOMEN: Soft, nontender, nondistended. Bowel sounds present. No organomegaly or mass.  EXTREMITIES: No pedal edema, cyanosis, or  clubbing.  NEUROLOGIC: Cranial nerves II through XII are intact. Muscle strength 5/5 in all extremities. Sensation intact. Gait not checked.  PSYCHIATRIC: The patient is alert and oriented x 3.  SKIN: No obvious rash, lesion, or ulcer.   Physical Exam LABORATORY PANEL:   CBC Recent Labs  Lab 11/11/17 0703  WBC 30.1*  HGB 6.7*  HCT 19.6*  PLT 407   ------------------------------------------------------------------------------------------------------------------  Chemistries  Recent Labs  Lab 11/09/17 0444  11/11/17 0703  NA 138   < > 135  K 4.0   < > 4.5  CL 104   < > 99*  CO2 23   < > 25  GLUCOSE 107*   < > 114*  BUN 51*   < > 56*  CREATININE 5.33*   < > 6.35*  CALCIUM 8.5*   < > 7.8*  MG  --   --  2.1  AST 308*  --   --   ALT 300*  --   --   ALKPHOS 50  --   --   BILITOT 0.9  --   --    < > = values in this interval not displayed.   ------------------------------------------------------------------------------------------------------------------  Cardiac Enzymes No results for input(s): TROPONINI in the last 168 hours. ------------------------------------------------------------------------------------------------------------------  RADIOLOGY:  No results found.  ASSESSMENT AND PLAN:  27 year old male with known history of asthma, tobacco abuse presents to hospital secondary to an unresponsive state.  1. Acutecardiac arrest  Resolved  Concern for possible drug overdose, notedcardiac arrhythmia from electrolyte abnormalities likely   2. Acute renal failure and hyperkalemia Stable secondary to rhabdomyolysis Nephrology following, continue renal replacement therapy, next treatment is on Wednesday  3. Rhabdomyolysis, acute Resolving  Secondary topossible ischemic right leg with compartment syndrome. [facility]  4. Sepsis, acute Resolved Treated with course of IV  Unasyn/vancomycin  5. DIC, acute Resolved Secondary to sepsis Receivedblood transfusion during this admission  6. Polysubstance abuse We will need counseling once sensorium improves  7.  Acute right lower extremity ischemia  Status post disarticulation/right AKA by Dr. Gilda CreaseSchnier on 3/12 For revision to AKA later today    All the records are reviewed and case discussed with Care Management/Social Workerr. Management plans discussed with the patient, family and they are in agreement.  CODE STATUS: Partial  TOTAL TIME TAKING CARE OF THIS PATIENT: 35 minutes.     POSSIBLE D/C IN 1-3 DAYS, DEPENDING ON CLINICAL CONDITION.   Evelena AsaMontell D Chameka Mcmullen M.D on 11/11/2017   Between 7am to 6pm - Pager - [phone]  After 6pm go to [email or web address] - password [email or web address]  [facility]  Office  [phone]  CC: Primary care physician; Patient, No Pcp Per  Note: This dictation was prepared with Dragon dictation along with smaller phrase technology. Any transcriptional errors that result from this process are unintentional.

## 2017-11-11 NOTE — Progress Notes (Signed)
Pt profile: [age] M with hx of polysubstance abuse adm 03/28 after found in hotel room unresponsive and suffered VF arrest during transport by EMS to ED. UDS positive for cocaine and opiates.  Found to have ischemic RLE with severe rhabdomyolysis.  Underwent RLE disarticulation at the knee 03/12.  Self extubated 03/15.  ICU course complicated by anuric renal failure requiring hemodialysis and chronic pain syndrome with extremely high opiate tolerance  Echo 03/09: LVEF 35-40%  Subj: Status post completion of RLE AKA 03/19.  Complains of RLE pain.  Otherwise, no new complaints  Obj: Vitals:   11/11/17 1000 11/11/17 1215  BP: (!) 146/91 (!) 149/88  Pulse: 96 92  Resp: 20 17  Temp:  98.6 F (37 C)  SpO2: 100% 92%    Gen: NAD HEENT: NCAT, sclerae white Neck: No JVD noted Chest: Clear to auscultation anteriorly Cardiac: Reg, no M ZOX:WRUEAbd:NABS, soft Ext: RLE amputation. Wound dressed. No LLE edema  I repeated a complete physical exam today and find no significant changes compared to prior exams  BMP Latest Ref Rng & Units 11/11/2017 11/10/2017 11/09/2017  Glucose 65 - 99 mg/dL 454(U114(H) 81 981(X107(H)  BUN 6 - 20 mg/dL 91(Y56(H) 78(G40(H) 95(A51(H)  Creatinine 0.61 - 1.24 mg/dL 2.13(Y6.35(H) 8.65(H4.69(H) 8.46(N5.33(H)  Sodium 135 - 145 mmol/L 135 138 138  Potassium 3.5 - 5.1 mmol/L 4.5 3.6 4.0  Chloride 101 - 111 mmol/L 99(L) 100(L) 104  CO2 22 - 32 mmol/L 25 27 23   Calcium 8.9 - 10.3 mg/dL 7.8(L) 8.1(L) 8.5(L)     CBC Latest Ref Rng & Units 11/11/2017 11/10/2017 11/09/2017  WBC 3.8 - 10.6 K/uL 30.1(H) 27.9(H) 30.0(H)  Hemoglobin 13.0 - 18.0 g/dL 6.7(L) 7.4(L) 7.0(L)  Hematocrit 40.0 - 52.0 % 19.6(L) 22.5(L) 20.6(L)  Platelets 150 - 440 K/uL 407 371 268      CXR: No new film   IMPRESSION: Status post prolonged cardio respiratory arrest due to drug overdose Dilated cardiomyopathy (LVEF 35-40%) Status post RLE AKA Severe rhabdomyolysis Anuric AKI Polysubstance abuse Chronic pain syndrome -inadequately  controlled Extremely high opiate tolerance  Anemia without ongoing acute blood loss.  Appears to be tolerating well.  PLAN/RECS:  Transfer to MedSurg floor Continue supplemental oxygen as needed Hemodialysis per renal service Increase OxyContin to 80 mg twice daily 11/11/17 Continue hydromorphone PCA Monitor BMET intermittently Monitor I/Os Correct electrolytes as indicated  Monitor CBC intermittently Transfuse per usual guidelines   -he seems to tolerate current level of anemia so no absolute indication for transfusion presently Postop management per vascular surgery After transfer, PCCM will sign off. Please call if we can be of further assistance   Billy Fischeravid Laquasha Groome, MD PCCM service Mobile [phone] Pager [phone] 11/11/2017 12:36 PM

## 2017-11-11 NOTE — Anesthesia Postprocedure Evaluation (Signed)
Anesthesia Post Note  Patient: Joshua [name]  Procedure(s) Performed: AMPUTATION ABOVE KNEE (Right )  Patient location during evaluation: ICU Anesthesia Type: General Level of consciousness: awake and alert Pain management: pain level controlled Vital Signs Assessment: post-procedure vital signs reviewed and stable Respiratory status: respiratory function stable Cardiovascular status: stable Anesthetic complications: no     Last Vitals:  Vitals:   11/11/17 0500 11/11/17 0600  BP: 138/84 (!) 154/82  Pulse:    Resp:  12  Temp:    SpO2:      Last Pain:  Vitals:   11/11/17 0353  TempSrc:   PainSc: Joshua [name]                 Joshua [name]

## 2017-11-11 NOTE — Progress Notes (Signed)
[facility]  ROUNDING NOTE   Subjective:    Underwent amputation revision yesterday Able to eat without nausea or vomiting  remains anuric Potassium 4.5, phosphorus 5.2, hemoglobin low at 6.7 No leg edema   Objective:  Vital signs in last 24 hours:  Temp:  [97.1 F (36.2 C)-98.8 F (37.1 C)] 97.4 F (36.3 C) (03/20 0100) Pulse Rate:  [95-105] 105 (03/19 1906) Resp:  [12-28] 12 (03/20 0600) BP: (128-164)/(75-91) 154/82 (03/20 0600) SpO2:  [95 %-100 %] 95 % (03/20 0353)  Weight change:  Filed Weights   11/08/17 0412 11/09/17 1700 11/09/17 2117  Weight: 77.9 kg (171 lb 11.8 oz) 74.5 kg (164 lb 3.9 oz) 75 kg (165 lb 5.5 oz)    Intake/Output: I/O last 3 completed shifts: In: 1025 [I.V.:725; Blood:300] Out: 917 [Other:667; Blood:250]   Intake/Output this shift:  No intake/output data recorded.  Physical Exam: General: Ill appearing  Head: [place]/AT  Eyes:  Anicteric  Neck: Right IJ dialysis catheter,    Lungs:   Decreased breath sounds at bases otherwise clear, [place] O2  Heart:  Regular, tachycardic  Abdomen:  Soft, nontender  Extremities: Right amputation, left with trace edema,  Neurologic:  Alert, able to follow commands     Access: RIJ temp HD catheter 3/8 Dr. Lonn Georgia    Basic Metabolic Panel: Recent Labs  Lab 11/05/17 4098  11/05/17 2139  11/06/17 1191 11/06/17 1511 11/06/17 2050 11/07/17 4782 11/08/17 0404 11/09/17 0444 11/10/17 0918 11/11/17 0703  NA 140   < >  --    < > 136 138 137 135 138 138 138 135  K 4.4   < >  --    < > 4.0 4.0 3.5 3.9 3.7 4.0 3.6 4.5  CL 106   < >  --    < > 102 102 105 103 102 104 100* 99*  CO2 25   < >  --    < > 24 26 26 26 23 23 27 25   GLUCOSE 92   < >  --    < > 129* 89 78 94 73 107* 81 114*  BUN 26*   < >  --    < > 30* 31* 29* 30* 29* 51* 40* 56*  CREATININE 2.25*   < >  --    < > 2.04* 2.12* 2.03* 2.26* 3.13* 5.33* 4.69* 6.35*  CALCIUM 7.6*   < >  --    < > 8.0* 8.3* 8.0* 8.2* 8.4* 8.5* 8.1* 7.8*  MG  1.6*  --  2.0  --  1.8  --  1.7  --   --   --   --  2.1  PHOS 4.1   < >  --    < > 3.1 2.0* 1.8* 1.9*  --   --   --  5.2*   < > = values in this interval not displayed.    Liver Function Tests: Recent Labs  Lab 11/06/17 0954 11/06/17 1511 11/06/17 2050 11/07/17 0351 11/09/17 0444  AST  --   --   --   --  308*  ALT  --   --   --   --  300*  ALKPHOS  --   --   --   --  50  BILITOT  --   --   --   --  0.9  PROT  --   --   --   --  5.0*  ALBUMIN 2.2* 2.5* 2.1* 2.2*  2.2*   No results for input(s): LIPASE, AMYLASE in the last 168 hours. No results for input(s): AMMONIA in the last 168 hours.  CBC: Recent Labs  Lab 11/05/17 0946 11/06/17 0954 11/07/17 0351 11/08/17 0404 11/09/17 0444 11/10/17 0918 11/11/17 0703  WBC 15.9* 18.9* 20.4* 24.3* 30.0* 27.9* 30.1*  NEUTROABS 12.9* 16.4* 16.9* 19.2* 25.8*  --   --   HGB 8.1* 7.8* 8.3* 7.4* 7.0* 7.4* 6.7*  HCT 24.3* 23.3* 25.2* 22.7* 20.6* 22.5* 19.6*  MCV 90.9 91.5 91.5 92.9 90.2 91.2 86.9  PLT 131* 152 155 225 268 371 407    Cardiac Enzymes: Recent Labs  Lab 11/05/17 1630 11/06/17 0221 11/06/17 2050 11/07/17 0351 11/08/17 0404  CKTOTAL 16,006* 13,244* 7,941* 7,569* 7,699*    BNP: Invalid input(s): POCBNP  CBG: Recent Labs  Lab 11/08/17 1543 11/08/17 2016 11/08/17 2310 11/09/17 0400 11/09/17 0823  GLUCAP 95 101* 101* 89 85    Microbiology: Results for orders placed or performed during the hospital encounter of 10/30/17  MRSA PCR Screening     Status: None   Collection Time: 10/30/17  7:54 PM  Result Value Ref Range Status   MRSA by PCR NEGATIVE NEGATIVE Final    Comment:        The GeneXpert MRSA Assay (FDA approved for NASAL specimens only), is one component of a comprehensive MRSA colonization surveillance program. It is not intended to diagnose MRSA infection nor to guide or monitor treatment for MRSA infections. Performed at [facility], [address] [place], [place] [address]    Culture, respiratory (NON-Expectorated)     Status: None   Collection Time: 11/05/17  8:17 AM  Result Value Ref Range Status   Specimen Description   Final    TRACHEAL ASPIRATE Performed at [facility], [address] [place], [place] [address]    Special Requests   Final    NONE Performed at [facility], [address] [place], [place] [address]    Gram Stain   Final    FEW WBC PRESENT, PREDOMINANTLY PMN MODERATE GRAM NEGATIVE RODS RARE YEAST Performed at [facility], [address] [place], [place] [address]    Culture MODERATE ENTEROBACTER CLOACAE  Final   Report Status 11/08/2017 FINAL  Final   Organism ID, Bacteria ENTEROBACTER CLOACAE  Final      Susceptibility   Enterobacter cloacae - MIC*    CEFAZOLIN >=64 RESISTANT Resistant     CEFEPIME <=1 SENSITIVE Sensitive     CEFTAZIDIME <=1 SENSITIVE Sensitive     CEFTRIAXONE <=1 SENSITIVE Sensitive     CIPROFLOXACIN <=0.25 SENSITIVE Sensitive     GENTAMICIN <=1 SENSITIVE Sensitive     IMIPENEM <=0.25 SENSITIVE Sensitive     TRIMETH/SULFA <=20 SENSITIVE Sensitive     PIP/TAZO <=4 SENSITIVE Sensitive     * MODERATE ENTEROBACTER CLOACAE  Culture, blood (routine x 2)     Status: None (Preliminary result)   Collection Time: 11/09/17  8:49 PM  Result Value Ref Range Status   Specimen Description BLOOD DIALYSIS  Final   Special Requests   Final    BOTTLES DRAWN AEROBIC AND ANAEROBIC Blood Culture adequate volume   Culture   Final    NO GROWTH 2 DAYS Performed at [facility], [address] [place], [place] [address]    Report Status PENDING  Incomplete    Coagulation Studies: Recent Labs    11/10/17 0918 11/11/17 0703  LABPROT 15.8* 16.0*  INR  1.27 1.29    Urinalysis: No results for input(s): COLORURINE, LABSPEC, PHURINE, GLUCOSEU, HGBUR, BILIRUBINUR, KETONESUR, PROTEINUR, UROBILINOGEN, NITRITE, LEUKOCYTESUR in the last 72 hours.  Invalid input(s): APPERANCEUR     Imaging: No results found.   Medications:    . feeding supplement (ENSURE ENLIVE)  237 mL Oral TID BM  . gabapentin  100 mg Oral TID  . HYDROmorphone   Intravenous Q4H  . multivitamin with minerals  1 tablet Oral Daily  . nicotine  21 mg Transdermal Daily  . oxyCODONE  40 mg Oral Q12H  . QUEtiapine  25 mg Oral QHS  . sodium chloride flush  10-40 mL Intracatheter Q12H   ALPRAZolam, bisacodyl, dextrose, diphenhydrAMINE **OR** diphenhydrAMINE, docusate sodium, naloxone **AND** sodium chloride flush, nystatin cream, ondansetron (ZOFRAN) IV, sennosides  Assessment/ Plan:  [name] is a 27 y.o. white male with asthma, bronchitis, polysubstance abuse, was admitted on3/8/2019after he was found down in a hotel for an unknown period of time in the fetal position. Hospital course includes prolonged CPR for 45 minutes in the emergency room. Ventricular fibrillation arrest, hypothermia protocol.  Urine tox screen positive for cocaine and opiates.  Hospital course complicated by acute respiratory failure and acute renal failure. Started on CRRT 3/8. Underwent disarticulation of right lower extremity due to ischemic limb. Rhabdomyolysis with sustained CPK greater than 50,000 for several days.Self extubated on 3/15. Transitioned to intermittent hemodialysis on 3/16.   1.  Acute renal failure:  Acute renal failure secondary to ATN, shock, hypotension and rhabdomyolysis.  still anuric.  Requiring renal replacement therapy.  Monitor for renal recovery. Continue to monitor volume status, urine output, renal function and electrolytes.  Renally dose all medications.  Next HD planned for Wednesday Start planning for outpatient placement We will discuss with vascular surgery regarding placement of permanent access  2.  Rhabdomyolysis: cocaine induced rhabdomyolysis. CPK trending down  3.  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 Gilda Crease on 3/12.    Underwent revision 3/19 Decrease dose of Neurontin (renal failure)- at risk for neurotoxicity May increase dose over time slowly     LOS: 12 Joshua [name] 3/20/20198:41 AM

## 2017-11-11 NOTE — Progress Notes (Signed)
 Vein & Vascular Surgery  Daily Progress Note   Subjective: 1 Day Post-Op: Right above-the-knee amputation  POD#8: Right knee disarticulation with amputation  Patient seen in dialysis. Complaining of scrotal pain.   Objective: Vitals:   11/11/17 1805 11/11/17 1815 11/11/17 1830 11/11/17 1836  BP: (!) 166/82 (!) 157/74 (!) 156/78   Pulse: 100 95 99 (!) 103  Resp: 14 15 (!) 22 16  Temp:      TempSrc:      SpO2: 96% 98% 98% 99%  Weight:      Height:        Intake/Output Summary (Last 24 hours) at 11/11/2017 1916 Last data filed at 11/11/2017 1830 Gross per 24 hour  Intake -  Output 392 ml  Net -392 ml   Physical Exam: A&Ox3, NAD CV: RRR Pulmonary: CTA Bilaterally Abdomen: Soft, Nontender, Nondistended GU: Edematous Scrotum  Vascular:  Right Lower Extremity: OR dressing intact, clean and dry. Thigh moderate edema. Warm.  Laboratory: CBC    Component Value Date/Time   WBC 30.1 (H) 11/11/2017 0703   HGB 6.7 (L) 11/11/2017 0703   HGB 16.2 05/08/2014 2307   HCT 19.6 (L) 11/11/2017 0703   HCT 48.0 05/08/2014 2307   PLT 407 11/11/2017 0703   PLT 297 05/08/2014 2307   BMET    Component Value Date/Time   NA 135 11/11/2017 0703   NA 141 05/08/2014 2307   K 4.5 11/11/2017 0703   K 4.0 05/08/2014 2307   CL 99 (L) 11/11/2017 0703   CL 103 05/08/2014 2307   CO2 25 11/11/2017 0703   CO2 31 05/08/2014 2307   GLUCOSE 114 (H) 11/11/2017 0703   GLUCOSE 95 05/08/2014 2307   BUN 56 (H) 11/11/2017 0703   BUN 19 (H) 05/08/2014 2307   CREATININE 6.35 (H) 11/11/2017 0703   CREATININE 1.02 05/08/2014 2307   CALCIUM 7.8 (L) 11/11/2017 0703   CALCIUM 9.6 05/08/2014 2307   GFRNONAA 11 (L) 11/11/2017 0703   GFRNONAA >60 05/08/2014 2307   GFRAA 13 (L) 11/11/2017 0703   GFRAA >60 05/08/2014 2307   Assessment/Planning: 27 year old male s/p cardiac arrest now 1 Day Post-Op: Right above-the-knee amputation and  POD#8: Right knee disarticulation with amputation -  Stable 1) Will plan on OR dressing change POD#4 or #5 2) Scrotal edema - elevate scrotum. Will consult urology in AM for recommendations 3) PT / OT 4) Social Work / Case Management consult for after care  Cleda DaubKimberly Carianne Taira PA-C 11/11/2017 7:16 PM

## 2017-11-12 ENCOUNTER — Inpatient Hospital Stay: Payer: Self-pay

## 2017-11-12 DIAGNOSIS — N5089 Other specified disorders of the male genital organs: Secondary | ICD-10-CM

## 2017-11-12 LAB — URINALYSIS, ROUTINE W REFLEX MICROSCOPIC
BILIRUBIN URINE: NEGATIVE
GLUCOSE, UA: 50 mg/dL — AB
KETONES UR: NEGATIVE mg/dL
NITRITE: NEGATIVE
PH: 6 (ref 5.0–8.0)
PROTEIN: 100 mg/dL — AB
Specific Gravity, Urine: 1.019 (ref 1.005–1.030)

## 2017-11-12 LAB — CBC
HEMATOCRIT: 16.6 % — AB (ref 40.0–52.0)
Hemoglobin: 5.7 g/dL — ABNORMAL LOW (ref 13.0–18.0)
MCH: 29.7 pg (ref 26.0–34.0)
MCHC: 34.2 g/dL (ref 32.0–36.0)
MCV: 87.1 fL (ref 80.0–100.0)
Platelets: 389 10*3/uL (ref 150–440)
RBC: 1.9 MIL/uL — ABNORMAL LOW (ref 4.40–5.90)
RDW: 14.8 % — ABNORMAL HIGH (ref 11.5–14.5)
WBC: 16.3 10*3/uL — ABNORMAL HIGH (ref 3.8–10.6)

## 2017-11-12 LAB — BASIC METABOLIC PANEL
Anion gap: 6 (ref 5–15)
BUN: 32 mg/dL — AB (ref 6–20)
CALCIUM: 7.6 mg/dL — AB (ref 8.9–10.3)
CO2: 28 mmol/L (ref 22–32)
Chloride: 101 mmol/L (ref 101–111)
Creatinine, Ser: 4.52 mg/dL — ABNORMAL HIGH (ref 0.61–1.24)
GFR calc Af Amer: 19 mL/min — ABNORMAL LOW (ref >60)
GFR, EST NON AFRICAN AMERICAN: 16 mL/min — AB (ref >60)
Glucose, Bld: 94 mg/dL (ref 65–99)
POTASSIUM: 4.2 mmol/L (ref 3.5–5.1)
Sodium: 135 mmol/L (ref 135–145)

## 2017-11-12 LAB — PREPARE RBC (CROSSMATCH)

## 2017-11-12 LAB — MAGNESIUM: Magnesium: 2 mg/dL (ref 1.7–2.4)

## 2017-11-12 LAB — HEMOGLOBIN AND HEMATOCRIT, BLOOD
HEMATOCRIT: 23.3 % — AB (ref 40.0–52.0)
HEMOGLOBIN: 7.8 g/dL — AB (ref 13.0–18.0)

## 2017-11-12 LAB — FOLATE: FOLATE: 7.5 ng/mL (ref >5.9)

## 2017-11-12 LAB — SURGICAL PATHOLOGY

## 2017-11-12 LAB — VITAMIN B12: VITAMIN B 12: 358 pg/mL (ref 180–914)

## 2017-11-12 MED ORDER — CEFAZOLIN SODIUM-DEXTROSE 1-4 GM/50ML-% IV SOLN
1.0000 g | INTRAVENOUS | Status: AC
  Administered 2017-11-13: 1 g via INTRAVENOUS
  Filled 2017-11-12: qty 50

## 2017-11-12 MED ORDER — VITAMIN C 500 MG PO TABS
250.0000 mg | ORAL_TABLET | Freq: Two times a day (BID) | ORAL | Status: DC
  Administered 2017-11-12 – 2017-11-27 (×27): 250 mg via ORAL
  Filled 2017-11-12 (×32): qty 0.5

## 2017-11-12 MED ORDER — FERROUS SULFATE 325 (65 FE) MG PO TABS
325.0000 mg | ORAL_TABLET | Freq: Two times a day (BID) | ORAL | Status: DC
  Administered 2017-11-12 – 2017-11-27 (×28): 325 mg via ORAL
  Filled 2017-11-12 (×29): qty 1

## 2017-11-12 MED ORDER — CIPROFLOXACIN HCL 500 MG PO TABS
500.0000 mg | ORAL_TABLET | Freq: Every day | ORAL | Status: AC
  Administered 2017-11-12 – 2017-11-15 (×4): 500 mg via ORAL
  Filled 2017-11-12 (×4): qty 1

## 2017-11-12 MED ORDER — SODIUM CHLORIDE 0.9 % IV SOLN
Freq: Once | INTRAVENOUS | Status: AC
  Administered 2017-11-12: 06:00:00 via INTRAVENOUS

## 2017-11-12 NOTE — Consult Note (Signed)
  Psychiatry: Came to see patient for consult. Chart reviewed. Patient is at MRI. Waited  About 30 min. Still at MRI. Will follow up tomorrow.

## 2017-11-12 NOTE — Consult Note (Signed)
11/12/2017 9:49 AM   Joshua [name] 04/16/1991 [id number]  Referring provider: Cleda Daub  CC: Scrotal edema  HPI: The patient is a 27 year old gentleman with long history of polysubstance abuse who initially presented to the hospital after being found unresponsive in a hotel room.  He suffered from cardiac arrest and transportation to the emergency department.  Urine drug screen was positive for opiates and cocaine.  Eventually developed severe rhabdomyolysis requiring right lower extremity disarticulation.  He also developed renal failure requiring dialysis.  Urology was consulted for new onset scrotal edema.  This is new since admission to the hospital.  The patient also claims that it is painful.  No drainage or erythema per the patient.  He does also have edema of his left lower extremity and bilateral arms.  No current sign of infection.   PMH: Past Medical History:  Diagnosis Date  . Asthma   . Bronchitis   . Hernia cerebri [facility])     Surgical History: Past Surgical History:  Procedure Laterality Date  . AMPUTATION Right 11/03/2017   Procedure: KNEE DISARTICULATION;  Surgeon: Renford Dills, MD;  Location: [facility] ORS;  Service: Vascular;  Laterality: Right;  . AMPUTATION Right 11/10/2017   Procedure: AMPUTATION ABOVE KNEE;  Surgeon: Renford Dills, MD;  Location: [facility] ORS;  Service: Vascular;  Laterality: Right;  . ankle/foot surgery with metal plates      Home Medications:  Allergies as of 11/12/2017   No Known Allergies      Allergies: No Known Allergies  Family History: History reviewed. No pertinent family history.  Social History:  reports that he has been smoking cigarettes.  He has been smoking about 0.50 packs per day. He has never used smokeless tobacco. He reports that he does not drink alcohol or use drugs.   ROS: 12 point ROS negative except for above  Physical Exam: BP 136/71   Pulse 100   Temp 99.6 F (37.6 C) (Oral)   Resp 20   Ht 5'  10" (1.778 m)   Wt 171 lb 11.8 oz (77.9 kg)   SpO2 96%   BMI 24.64 kg/m   Constitutional:  Alert and oriented, No acute distress. HEENT: [place] AT, moist mucus membranes.  Trachea midline, no masses. Cardiovascular: No clubbing, cyanosis, or edema. Respiratory: Normal respiratory effort, no increased work of breathing. GI: Abdomen is soft, nontender, nondistended, no abdominal masses GU: No CVA tenderness.  Penis mildly edematous moderate scrotal edema.  Nontender to palpation.  Testicles normal.  No sign of infection.  No sign of Fournier's.  No sign of cellulitis.  No sign of drainage. Skin: No rashes, bruises or suspicious lesions. Lymph: No cervical or inguinal adenopathy. Neurologic: Grossly intact, no focal deficits, moving all 4 extremities. Psychiatric: Normal mood and affect.  Laboratory Data: Lab Results  Component Value Date   WBC 16.3 (H) 11/12/2017   HGB 5.7 (L) 11/12/2017   HCT 16.6 (L) 11/12/2017   MCV 87.1 11/12/2017   PLT 389 11/12/2017    Lab Results  Component Value Date   CREATININE 4.52 (H) 11/12/2017    No results found for: PSA  No results found for: TESTOSTERONE  No results found for: HGBA1C  Urinalysis    Component Value Date/Time   COLORURINE AMBER (A) 10/30/2017 1315   APPEARANCEUR CLOUDY (A) 10/30/2017 1315   APPEARANCEUR Turbid 05/08/2014 2307   LABSPEC 1.023 10/30/2017 1315   LABSPEC 1.012 05/08/2014 2307   PHURINE 5.0 10/30/2017 1315   GLUCOSEU  NEGATIVE 10/30/2017 1315   GLUCOSEU Negative 05/08/2014 2307   HGBUR SMALL (A) 10/30/2017 1315   BILIRUBINUR NEGATIVE 10/30/2017 1315   BILIRUBINUR Negative 05/08/2014 2307   KETONESUR NEGATIVE 10/30/2017 1315   PROTEINUR 30 (A) 10/30/2017 1315   NITRITE NEGATIVE 10/30/2017 1315   LEUKOCYTESUR NEGATIVE 10/30/2017 1315   LEUKOCYTESUR Negative 05/08/2014 2307    Assessment & Plan:    1.  Scrotal edema This is secondary to his overall cardiac, renal, and fluid status.  There is no urological  intervention that will improve this.  Recommend scrotal elevation.  Please page on-call urology with any questions.   Hildred LaserBrian James Frankye Schwegel, MD

## 2017-11-12 NOTE — Progress Notes (Signed)
OT Cancellation Note  Patient Details Name: Levonne HubertMark A Conerly MRN: [id number] DOB: 04/27/1991   Cancelled Treatment:    Reason Eval/Treat Not Completed: Medical issues which prohibited therapy. Order received, chart reviewed. Pt noted with low hemoglobin 5.7, HCT 16.6. Blood transfusion ordered. Spoke with RN, transfusion initiated and in process. Pt contraindicated for therapy at this time per therapy protocol. Will continue to follow acutely and re-attempt OT evaluation once pt is medically appropriate.    Richrd PrimeJamie Stiller, MPH, MS, OTR/L ascom [phone] 11/12/17, 8:30 AM

## 2017-11-12 NOTE — Progress Notes (Addendum)
Daily Progress Note   Patient Name: Joshua [name]       Date: 11/12/2017 DOB: 03/10/1991  Age: 27 y.o. MRN#: [id number] Attending Physician: Bertrum SolSalary, Montell D, MD Primary Care Physician: Patient, No Pcp Per Admit Date: 10/30/2017  Reason for Consultation/Follow-up: Establishing goals of care  Subjective: Patient resting in bed, wife at bedside, Nurse Tech at bedside. He would like to be a full code. Patient states he has numbness from the middle back down the left leg and foot. He is able to move the leg, and wiggles the toes. He states he can barely feel touch of the foot and leg. He states it started this morning. Spoke with Neurology, and primary team.  MOST form with limited resuscitation shredded. Full code order entered.   Length of Stay: 13  Current Medications: Scheduled Meds:  . ciprofloxacin  500 mg Oral q1800  . feeding supplement (ENSURE ENLIVE)  237 mL Oral TID BM  . ferrous sulfate  325 mg Oral BID WC  . gabapentin  100 mg Oral TID  . multivitamin with minerals  1 tablet Oral Daily  . nicotine  21 mg Transdermal Daily  . oxyCODONE  80 mg Oral Q12H  . QUEtiapine  25 mg Oral QHS  . sodium chloride flush  10-40 mL Intracatheter Q12H  . tuberculin  5 Units Intradermal Once  . vitamin C  250 mg Oral BID    Continuous Infusions:   PRN Meds: ALPRAZolam, bisacodyl, dextrose, docusate sodium, morphine injection, nystatin cream, sennosides  Physical Exam  Constitutional: No distress.  Pulmonary/Chest: Effort normal.  Neurological: He is alert.  Oriented. Wiggles toes, moves leg.             Vital Signs: BP (!) 157/81   Pulse 95   Temp 98.8 F (37.1 C) (Oral)   Resp 20   Ht 5\' 10"  (1.778 m)   Wt 77.9 kg (171 lb 11.8 oz)   SpO2 96%   BMI 24.64 kg/m  SpO2: SpO2: 96  % O2 Device: O2 Device: Room Air O2 Flow Rate: O2 Flow Rate (L/min): 2 L/min  Intake/output summary:   Intake/Output Summary (Last 24 hours) at 11/12/2017 1520 Last data filed at 11/12/2017 1452 Gross per 24 hour  Intake 972.78 ml  Output 392 ml  Net 580.78 ml  LBM: Last BM Date: 11/10/17 Baseline Weight: Weight: 74.8 kg (165 lb) Most recent weight: Weight: 77.9 kg (171 lb 11.8 oz)       Palliative Assessment/Data: 50%    Flowsheet Rows     Most Recent Value  Intake Tab  Referral Department  Hospitalist  Unit at Time of Referral  Med/Surg Unit  Palliative Care Primary Diagnosis  Other (Comment) [multiporgan failure]  Date Notified  11/04/17  Palliative Care Type  New Palliative care  Reason for referral  Clarify Goals of Care  Date of Admission  10/30/17  Date first seen by Palliative Care  11/04/17  # of days Palliative referral response time  0 Day(s)  # of days IP prior to Palliative referral  5  Clinical Assessment  Psychosocial & Spiritual Assessment  Palliative Care Outcomes      Patient Active Problem List   Diagnosis Date Noted  . Acute kidney failure, unspecified (HCC) 11/02/2017  . Rhabdomyolysis 11/02/2017  . Acute respiratory failure (HCC) 11/02/2017  . Cardiogenic shock (HCC) 10/30/2017  . Cardiac arrest (HCC) 10/30/2017  . Drug abuse (HCC) 10/30/2017    Palliative Care Assessment & Plan   Patient Profile:  27 year old gentleman ith a past medical history of polysubstance abuse, status post prolonged cardiac arrest, respiratory failure, renal failure, rhabdomyolysis, shock.   Assessment/Recommendations/Plan:  Discussed code status with patient, returned to full code status.    Code Status:    Code Status Orders  (From admission, onward)        Start     Ordered   11/04/17 1543  Limited resuscitation (code)  Continuous    Question Answer Comment  In the event of cardiac or respiratory ARREST: Initiate Code Blue, Call Rapid Response  Yes   In the event of cardiac or respiratory ARREST: Perform CPR No   In the event of cardiac or respiratory ARREST: Perform Intubation/Mechanical Ventilation Yes   In the event of cardiac or respiratory ARREST: Use NIPPV/BiPAp only if indicated Yes   In the event of cardiac or respiratory ARREST: Administer ACLS medications if indicated Yes   In the event of cardiac or respiratory ARREST: Perform Defibrillation or Cardioversion if indicated No   Comments patient already intubated. Continue current intubation.      11/04/17 1544    Code Status History    Date Active Date Inactive Code Status Order ID Comments User Context   10/30/2017 2110 11/04/2017 1544 Full Code [id number]  Lewie Loron, NP Inpatient   10/30/2017 1813 10/30/2017 2110 Full Code [id number]  Altamese Dilling, MD Inpatient        Discharge Planning:  To Be Determined  Care plan was discussed with Drs. Salary and Thad Ranger  Thank you for allowing the Palliative Medicine Team to assist in the care of this patient.   Total Time 35 min Prolonged Time Billed  NO       Greater than 50%  of this time was spent counseling and coordinating care related to the above assessment and plan.  Morton Stall, NP  Please contact Palliative Medicine Team phone at [phone] for questions and concerns.

## 2017-11-12 NOTE — Progress Notes (Signed)
Paged Dr. Sheryle Hailiamond to make him aware of patient hgb of 5.7. Waiting for him to return call.

## 2017-11-12 NOTE — Progress Notes (Signed)
Per Dr. Sheryle Hailiamond give 2 units of PRBc's for hgb of 5.7.

## 2017-11-12 NOTE — Progress Notes (Signed)
PT Cancellation Note  Patient Details Name: Joshua [name] MRN: [id number] DOB: 12/01/1990   Cancelled Treatment:    Reason Eval/Treat Not Completed: Medical issues which prohibited therapy.  Order received, chart reviewed. Pt noted with low hemoglobin 5.7, HCT 16.6. Blood transfusion ordered. Spoke with RN, transfusion initiated and in process. Pt contraindicated for therapy at this time per therapy protocol.  PT will re-attempt when pt is more appropriate.     Glenetta HewSarah Diontae Route, PT, DPT 11/12/2017, 10:56 AM

## 2017-11-12 NOTE — Progress Notes (Signed)
Patient was taken down to MRI and the sitter came back up with urine in the urinal that was tea colored this was patients first urine in some time. Notified Dr. Amado CoeGouru and had urinalysis ordered. Patient's chart was not on the floor nor patient to obtain label for specimen. Specimen handed off to night nurse to obtain label and send specimen once patient back on unit. Night nurse verbalized understanding that specimen needed to be labeled and sent.
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
## 2017-11-12 NOTE — Progress Notes (Signed)
[facility]  Daily Progress Note  Subjective: 2 Day Post-Op: Right above-the-knee amputation  POD#9:Right knee disarticulation with amputation  Patient was seen with wife at bedside.  Patient continues to experience pain at the amputation site.  Patient still experiencing scrotal edema and discomfort.   Objective: Vitals:   11/12/17 1055 11/12/17 1145 11/12/17 1239 11/12/17 1452  BP: (!) 156/85 (!) 147/77 (!) 155/82 (!) 157/81  Pulse: (!) 101 (!) 102 98 95  Resp: 12 16 14 20   Temp: 99.3 F (37.4 C) 99.6 F (37.6 C) 98.9 F (37.2 C) 98.8 F (37.1 C)  TempSrc: Oral Oral Oral Oral  SpO2: 97% 98% 98% 96%  Weight:      Height:        Intake/Output Summary (Last 24 hours) at 11/12/2017 1524 Last data filed at 11/12/2017 1452 Gross per 24 hour  Intake 972.78 ml  Output 392 ml  Net 580.78 ml   Physical Exam: A&Ox3, NAD CV: RRR Pulmonary: CTA Bilaterally Abdomen: Soft, Nontender, Nondistended GU: Edematous.  No drainage.  Skin is intact. Vascular:  Right lower extremity: On dressing intact clean and dry.  Thigh is soft.   Laboratory: CBC    Component Value Date/Time   WBC 16.3 (H) 11/12/2017 0329   HGB 5.7 (L) 11/12/2017 0329   HGB 16.2 05/08/2014 2307   HCT 16.6 (L) 11/12/2017 0329   HCT 48.0 05/08/2014 2307   PLT 389 11/12/2017 0329   PLT 297 05/08/2014 2307   BMET    Component Value Date/Time   NA 135 11/12/2017 0329   NA 141 05/08/2014 2307   K 4.2 11/12/2017 0329   K 4.0 05/08/2014 2307   CL 101 11/12/2017 0329   CL 103 05/08/2014 2307   CO2 28 11/12/2017 0329   CO2 31 05/08/2014 2307   GLUCOSE 94 11/12/2017 0329   GLUCOSE 95 05/08/2014 2307   BUN 32 (H) 11/12/2017 0329   BUN 19 (H) 05/08/2014 2307   CREATININE 4.52 (H) 11/12/2017 0329   CREATININE 1.02 05/08/2014 2307   CALCIUM 7.6 (L) 11/12/2017 0329   CALCIUM 9.6 05/08/2014 2307   GFRNONAA 16 (L) 11/12/2017 0329   GFRNONAA >60 05/08/2014 2307   GFRAA 19 (L) 11/12/2017  0329   GFRAA >60 05/08/2014 2307   Assessment/Planning: 27 year old man status post 2 Day Post-Op: Right above-the-knee amputation and POD#9:Right knee disarticulation with amputation - stable 1) appreciate urology recommendations.  Will continue to elevate scrotum. 2) OR dressing to be removed postop day # 4/5 3) OT /PT 4) Social Work 5) Dialysis as per normal routine  [facility] StoresKimberly Mariah Gerstenberger PA-C 11/12/2017 3:24 PM

## 2017-11-12 NOTE — Progress Notes (Signed)
[facility] [place] [facility]   PATIENT NAME: Joshua [name]    MR#:  [id number]  DATE OF BIRTH:  07/16/1991  SUBJECTIVE:  CHIEF COMPLAINT:   Chief Complaint  Patient presents with  . Drug Overdose  Patient complains of chronic diffuse pain, right lower extremity pain-improved on current pain regimen, mother and wife are at the bedside, and discussion with nephrology-plans for possible temporary hemodialysis catheter placement on tomorrow or the day after, urology input appreciated, per nursing staff patient had suicidal thoughts after discussion with 1 of our providers-patient made a statement that why he is even going through all this/?  Suicidal thoughts, sitter place, suicide precautions, psychiatry to see  REVIEW OF SYSTEMS:  CONSTITUTIONAL: No fever, fatigue or weakness.  EYES: No blurred or double vision.  EARS, NOSE, AND THROAT: No tinnitus or ear pain.  RESPIRATORY: No cough, shortness of breath, wheezing or hemoptysis.  CARDIOVASCULAR: No chest pain, orthopnea, edema.  GASTROINTESTINAL: No nausea, vomiting, diarrhea or abdominal pain.  GENITOURINARY: No dysuria, hematuria.  ENDOCRINE: No polyuria, nocturia,  HEMATOLOGY: No anemia, easy bruising or bleeding SKIN: No rash or lesion. MUSCULOSKELETAL: No joint pain or arthritis.   NEUROLOGIC: No tingling, numbness, weakness.  PSYCHIATRY: No anxiety or depression.   ROS  DRUG ALLERGIES:  No Known Allergies  VITALS:  Blood pressure (!) 156/85, pulse (!) 101, temperature 99.3 F (37.4 C), temperature source Oral, resp. rate 12, height 5\' 10"  (1.778 m), weight 77.9 kg (171 lb 11.8 oz), SpO2 97 %.  PHYSICAL EXAMINATION:  GENERAL:  27 y.o.-year-old patient lying in the bed with no acute distress.  EYES: Pupils equal, round, reactive to light and accommodation. No scleral icterus. Extraocular muscles intact.  HEENT: Head atraumatic, normocephalic. Oropharynx and nasopharynx clear.  NECK:  Supple, no jugular  venous distention. No thyroid enlargement, no tenderness.  LUNGS: Normal breath sounds bilaterally, no wheezing, rales,rhonchi or crepitation. No use of accessory muscles of respiration.  CARDIOVASCULAR: S1, S2 normal. No murmurs, rubs, or gallops.  ABDOMEN: Soft, nontender, nondistended. Bowel sounds present. No organomegaly or mass.  EXTREMITIES: No pedal edema, cyanosis, or clubbing.  NEUROLOGIC: Cranial nerves II through XII are intact. Muscle strength 5/5 in all extremities. Sensation intact. Gait not checked.  PSYCHIATRIC: The patient is alert and oriented x 3.  SKIN: No obvious rash, lesion, or ulcer.   Physical Exam LABORATORY PANEL:   CBC Recent Labs  Lab 11/12/17 0329  WBC 16.3*  HGB 5.7*  HCT 16.6*  PLT 389   ------------------------------------------------------------------------------------------------------------------  Chemistries  Recent Labs  Lab 11/09/17 0444  11/12/17 0329  NA 138   < > 135  K 4.0   < > 4.2  CL 104   < > 101  CO2 23   < > 28  GLUCOSE 107*   < > 94  BUN 51*   < > 32*  CREATININE 5.33*   < > 4.52*  CALCIUM 8.5*   < > 7.6*  MG  --    < > 2.0  AST 308*  --   --   ALT 300*  --   --   ALKPHOS 50  --   --   BILITOT 0.9  --   --    < > = values in this interval not displayed.   ------------------------------------------------------------------------------------------------------------------  Cardiac Enzymes No results for input(s): TROPONINI in the last 168 hours. ------------------------------------------------------------------------------------------------------------------  RADIOLOGY:  No results found.  ASSESSMENT AND PLAN:  27 year old male with known history  of asthma, tobacco abuse presents to hospital secondary to an unresponsive state.  1. Acutecardiac arrest  Resolved  Most likely secondary to polysubstance overdose    2. Acute end-stage renal disease  Secondary to cocaine induced rhabdomyolysis  In discussion with  nephrology-plans are for temporary hemodialysis catheter placement on tomorrow or the day after, vascular surgery following   3. Rhabdomyolysis, acute Resolved Secondary topossible ischemic right leg with compartment syndrome. Vascular surgery following,s/p right leg disarticulation at the [facility] 11/03/17 followed by revision with AKA on November 11, 2017, patient recovering well  4. Sepsis, acute Resolved Treated with course of IV Unasyn/vancomycin  5. DIC, acute Resolved Secondary to sepsis  6. 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  Acute suicidal thoughts Compounded by polysubstance abuse-patient with dual diagnosis Place with one-to-one sitter, consult psychiatry for expert opinion  8.  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  Acute on chronic anemia  Continue 2 unit packed red blood cell transfusion  transfusion  All the records are reviewed and case discussed with Care Management/Social Workerr. Management plans discussed with the patient, family and they are in agreement.  CODE STATUS: partial  TOTAL TIME TAKING CARE OF THIS PATIENT: 35 minutes.     POSSIBLE D/C IN 2-5 DAYS, DEPENDING ON CLINICAL CONDITION.   Evelena Asa Salary M.D on 11/12/2017   Between 7am to 6pm - Pager - [phone]  After 6pm go to [email or web address] - password [email or web address]  [facility]  Office  [phone]  CC: Primary care physician; Patient, No Pcp Per  Note: This dictation was prepared with Dragon dictation along with smaller phrase technology. Any transcriptional errors that result from this process are unintentional.

## 2017-11-13 ENCOUNTER — Inpatient Hospital Stay
Admission: EM | Disposition: A | Discharge status: Home Health | Payer: Self-pay | Source: Home / Self Care | Attending: Specialist

## 2017-11-13 DIAGNOSIS — F4323 Adjustment disorder with mixed anxiety and depressed mood: Secondary | ICD-10-CM

## 2017-11-13 DIAGNOSIS — Z9189 Other specified personal risk factors, not elsewhere classified: Secondary | ICD-10-CM

## 2017-11-13 LAB — BASIC METABOLIC PANEL
Anion gap: 8 (ref 5–15)
BUN: 49 mg/dL — ABNORMAL HIGH (ref 6–20)
CALCIUM: 7.6 mg/dL — AB (ref 8.9–10.3)
CHLORIDE: 98 mmol/L — AB (ref 101–111)
CO2: 26 mmol/L (ref 22–32)
CREATININE: 6.5 mg/dL — AB (ref 0.61–1.24)
GFR calc non Af Amer: 11 mL/min — ABNORMAL LOW (ref >60)
GFR, EST AFRICAN AMERICAN: 12 mL/min — AB (ref >60)
GLUCOSE: 90 mg/dL (ref 65–99)
Potassium: 4.8 mmol/L (ref 3.5–5.1)
Sodium: 132 mmol/L — ABNORMAL LOW (ref 135–145)

## 2017-11-13 LAB — BPAM RBC
BLOOD PRODUCT EXPIRATION DATE: 201904052359
BLOOD PRODUCT EXPIRATION DATE: 201904072359
BLOOD PRODUCT EXPIRATION DATE: 201904072359
BLOOD PRODUCT EXPIRATION DATE: 201904072359
Blood Product Expiration Date: 201903262359
Blood Product Expiration Date: 201904072359
Blood Product Expiration Date: 201904072359
ISSUE DATE / TIME: 201903150724
ISSUE DATE / TIME: 201903191711
ISSUE DATE / TIME: 201903210554
ISSUE DATE / TIME: 201903211059
ISSUE DATE / TIME: 201903211311
UNIT TYPE AND RH: 6200
UNIT TYPE AND RH: 6200
UNIT TYPE AND RH: 6200
UNIT TYPE AND RH: 6200
Unit Type and Rh: 6200
Unit Type and Rh: 6200
Unit Type and Rh: 6200

## 2017-11-13 LAB — TYPE AND SCREEN
ABO/RH(D): A POS
Antibody Screen: NEGATIVE
UNIT DIVISION: 0
UNIT DIVISION: 0
Unit division: 0
Unit division: 0
Unit division: 0
Unit division: 0
Unit division: 0

## 2017-11-13 LAB — HEMOGLOBIN AND HEMATOCRIT, BLOOD
HEMATOCRIT: 22 % — AB (ref 40.0–52.0)
HEMOGLOBIN: 7.3 g/dL — AB (ref 13.0–18.0)

## 2017-11-13 LAB — CBC
HEMATOCRIT: 21.2 % — AB (ref 40.0–52.0)
HEMOGLOBIN: 7.1 g/dL — AB (ref 13.0–18.0)
MCH: 29.7 pg (ref 26.0–34.0)
MCHC: 33.7 g/dL (ref 32.0–36.0)
MCV: 88.1 fL (ref 80.0–100.0)
Platelets: 411 10*3/uL (ref 150–440)
RBC: 2.41 MIL/uL — ABNORMAL LOW (ref 4.40–5.90)
RDW: 14.5 % (ref 11.5–14.5)
WBC: 17 10*3/uL — ABNORMAL HIGH (ref 3.8–10.6)

## 2017-11-13 LAB — QUANTIFERON-TB GOLD PLUS (RQFGPL)
QUANTIFERON MITOGEN VALUE: 0.07 [IU]/mL
QUANTIFERON TB1 AG VALUE: 0.02 [IU]/mL
QUANTIFERON TB2 AG VALUE: 0.02 [IU]/mL
QuantiFERON Nil Value: 0.02 IU/mL

## 2017-11-13 LAB — QUANTIFERON-TB GOLD PLUS: QuantiFERON-TB Gold Plus: UNDETERMINED

## 2017-11-13 SURGERY — DIALYSIS/PERMA CATHETER INSERTION
Anesthesia: Moderate Sedation

## 2017-11-13 MED ORDER — MORPHINE SULFATE (PF) 2 MG/ML IV SOLN
INTRAVENOUS | Status: AC
  Filled 2017-11-13: qty 1

## 2017-11-13 MED ORDER — SENNA 8.6 MG PO TABS
2.0000 | ORAL_TABLET | Freq: Every day | ORAL | Status: DC
  Administered 2017-11-13 – 2017-11-23 (×10): 17.2 mg via ORAL
  Filled 2017-11-13 (×14): qty 2

## 2017-11-13 MED ORDER — LACTULOSE 10 GM/15ML PO SOLN
30.0000 g | Freq: Two times a day (BID) | ORAL | Status: AC
  Administered 2017-11-13: 30 g via ORAL
  Filled 2017-11-13: qty 45
  Filled 2017-11-13 (×2): qty 60
  Filled 2017-11-13: qty 45

## 2017-11-13 MED ORDER — ACETAMINOPHEN 325 MG PO TABS
650.0000 mg | ORAL_TABLET | Freq: Four times a day (QID) | ORAL | Status: DC | PRN
  Administered 2017-11-13 – 2017-11-26 (×6): 650 mg via ORAL
  Filled 2017-11-13 (×7): qty 2

## 2017-11-13 NOTE — Progress Notes (Signed)
PT Cancellation Note  Patient Details Name: Levonne HubertMark A Lehnen MRN: [id number] DOB: 03/28/1991   Cancelled Treatment:    Reason Eval/Treat Not Completed: Patient at procedure or test/unavailable; Pt not available x 2 for PT eval; will attempt PT eval another date as medically appropriate.      Ovidio Hanger. Scott Latrease Kunde PT, DPT 11/13/17, 4:15 PM

## 2017-11-13 NOTE — Progress Notes (Signed)
Post dialysis assessment 

## 2017-11-13 NOTE — Progress Notes (Signed)
This note also relates to the following rows which could not be included: Pulse Rate - Cannot attach notes to unvalidated device data Resp - Cannot attach notes to unvalidated device data  HD STARTED  

## 2017-11-13 NOTE — OR Nursing (Signed)
Procedure cancelled report given to lydia and transport requested for patient to return to room 230

## 2017-11-13 NOTE — Progress Notes (Signed)
PRE DIALYSIS ASSESSMENT 

## 2017-11-13 NOTE — Progress Notes (Signed)
OT Cancellation Note  Patient Details Name: Joshua [name] MRN: [id number] DOB: 04/26/1991   Cancelled Treatment:    Reason Eval/Treat Not Completed: Medical issues which prohibited therapy(Pt. Hemoglobin is 7.1, and HCT is 21.2. Will contiue to Monitor, and intervene when appropriate.)  Olegario MessierElaine Chan Sheahan, MS, OTR/L 11/13/2017, 9:08 AM

## 2017-11-13 NOTE — Progress Notes (Signed)
This note also relates to the following rows which could not be included: Pulse Rate - Cannot attach notes to unvalidated device data Resp - Cannot attach notes to unvalidated device data  Hd completed  

## 2017-11-13 NOTE — Care Management (Signed)
MD note to start planning for outpatient HD placement.  [name] HD liaison notified. Will Attempt IV lasix over the weekend.

## 2017-11-13 NOTE — Progress Notes (Signed)
Pt's dressing came off pt's right leg surgical site.  Pt's primary nurse called, pt's nurse advised to placed abd pad over incision.  Two abd pads placed, secured by curlex and coban.  Pt tolerated procedure well but c/o ongoing pain.  See MAR for intervention.
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
## 2017-11-13 NOTE — Progress Notes (Signed)
[facility]  Daily Progress Note  3 Day Post-Op:Right above-the-knee amputation  POD#10:Right knee disarticulation with amputation  Subjective: Patient complaining of fever.  Patient continues to complain of right lower extremity stump pain.  Objective: Vitals:   11/13/17 1330 11/13/17 1400 11/13/17 1415 11/13/17 1451  BP:  (!) 152/87 (!) 159/74 (!) 154/83  Pulse: 97 92 92 (!) 111  Resp: 15 19 11 15   Temp:   99.9 F (37.7 C) 99.4 F (37.4 C)  TempSrc:   Oral   SpO2:    91%  Weight:    174 lb (78.9 kg)  Height:    5\' 10"  (1.778 m)    Intake/Output Summary (Last 24 hours) at 11/13/2017 1927 Last data filed at 11/13/2017 1909 Gross per 24 hour  Intake 120 ml  Output 2000 ml  Net -1880 ml   Physical Exam: A&Ox3, NAD CV: RRR Pulmonary: CTA Bilaterally Abdomen: Soft, Nontender, Nondistended GU: scrotum edematous Vascular:  Right lower extremity: Thigh edematous.  Our dressing removed.  Incision clean dry and intact.  No drainage noted.   Laboratory: CBC    Component Value Date/Time   WBC 17.0 (H) 11/13/2017 0334   HGB 7.1 (L) 11/13/2017 0334   HGB 16.2 05/08/2014 2307   HCT 21.2 (L) 11/13/2017 0334   HCT 48.0 05/08/2014 2307   PLT 411 11/13/2017 0334   PLT 297 05/08/2014 2307   BMET    Component Value Date/Time   NA 132 (L) 11/13/2017 0334   NA 141 05/08/2014 2307   K 4.8 11/13/2017 0334   K 4.0 05/08/2014 2307   CL 98 (L) 11/13/2017 0334   CL 103 05/08/2014 2307   CO2 26 11/13/2017 0334   CO2 31 05/08/2014 2307   GLUCOSE 90 11/13/2017 0334   GLUCOSE 95 05/08/2014 2307   BUN 49 (H) 11/13/2017 0334   BUN 19 (H) 05/08/2014 2307   CREATININE 6.50 (H) 11/13/2017 0334   CREATININE 1.02 05/08/2014 2307   CALCIUM 7.6 (L) 11/13/2017 0334   CALCIUM 9.6 05/08/2014 2307   GFRNONAA 11 (L) 11/13/2017 0334   GFRNONAA >60 05/08/2014 2307   GFRAA 12 (L) 11/13/2017 0334   GFRAA >60 05/08/2014 2307   Assessment/Planning: 27 year old man  status post 3 Day Post-Op:Right above-the-knee amputation and POD#10:Right knee disarticulation with amputation - stable 1) change right lower extremity stump dressing as needed now that the OR dressing has been removed 2) Occupational Therapy / physical therapy  Cleda DaubKimberly Kashvi Prevette PA-C 11/13/2017 7:27 PM

## 2017-11-13 NOTE — Consult Note (Signed)
[facility] Consult   Reason for Consult: Consult for 27 year old man who is in the hospital for an extended stay from acute loss of consciousness with multiple complications.  The consult was requested because of allegations of suicidal ideation. Referring Physician: Salary Patient Identification: Joshua [name] MRN:  [id number] Principal Diagnosis: Adjustment disorder with mixed anxiety and depressed mood Diagnosis:   Patient Active Problem List   Diagnosis Date Noted  . Adjustment disorder with mixed anxiety and depressed mood [F43.23] 11/13/2017  . At risk for abuse of opiates [Z91.89] 11/13/2017  . Acute kidney failure, unspecified ([place]) [N17.9] 11/02/2017  . Rhabdomyolysis [M62.82] 11/02/2017  . Acute respiratory failure ([place]) [J96.00] 11/02/2017  . Cardiogenic shock ([place]) [R57.0] 10/30/2017  . Cardiac arrest ([place]) [I46.9] 10/30/2017  . Drug abuse [facility]) [F19.10] 10/30/2017    Total Time spent with patient: 45 minutes  Subjective:   Joshua [name] is a 27 y.o. male patient admitted with "my back hurts".  HPI: Patient interviewed chart reviewed.  28 year old man presented to the hospital unconscious and not breathing with evidence strongly suggesting drug abuse and drug overdose.  Patient has had an extended course in the intensive care unit now resulting in complications such as dialysis and amputation.  Reports are that someone heard the patient make a suicidal statement which is what specifically prompted the consult.  On interview I found the patient awake and alert and cooperative.  He denied having any suicidal thoughts at all.  Denied having any knowledge or memory of what might of prompted the thought that he was suicidal.  Patient says his mood remains optimistic and upbeat.  Denies any hallucinations or psychotic symptoms.  Completely denies any thought of self-harm or harm to others.  Presents as smiling and upbeat affect.  Patient denies any kind of drug abuse which  she is clearly contradicted by the chart making some of his veracity questionable.  Social history: Says he lives with his mother.  Patient has family in the room with him who seem to be attentive.  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  His drug screen was positive for opiates and cannabis and reports are very clearly that he had been snorting some kind of white powder just before going to the hospital.  Unknown if he is ever had any treatment as he is not admitting to a problem  Past Psychiatric History: Denies any past psychiatric history.  No previous psychiatric notes.  Denies any history of hospitalization suicide attempt psychosis or violence  Risk to Self: Is patient at risk for suicide?: No Risk to Others:   Prior Inpatient Therapy:   Prior Outpatient Therapy:    Past Medical History:  Past Medical History:  Diagnosis Date  . Asthma   . Bronchitis   . Hernia cerebri [facility])     Past Surgical History:  Procedure Laterality Date  . AMPUTATION Right 11/03/2017   Procedure: KNEE DISARTICULATION;  Surgeon: Katha Cabal, MD;  Location: [facility] ORS;  Service: Vascular;  Laterality: Right;  . AMPUTATION Right 11/10/2017   Procedure: AMPUTATION ABOVE KNEE;  Surgeon: Katha Cabal, MD;  Location: [facility] ORS;  Service: Vascular;  Laterality: Right;  . ankle/foot surgery with metal plates     Family History: History reviewed. No pertinent family history. Family Psychiatric  History: Denies Social History:  Social History   Substance and Sexual  Activity  Alcohol Use No     Social History   Substance and Sexual Activity  Drug Use No    Social History   Socioeconomic History  . Marital status: Married    Spouse name: Not on file  . Number of children: Not on file  . Years of education: Not on file  . Highest education level: Not  on file  Occupational History  . Not on file  Social Needs  . Financial resource strain: Not on file  . Food insecurity:    Worry: Not on file    Inability: Not on file  . Transportation needs:    Medical: Not on file    Non-medical: Not on file  Tobacco Use  . Smoking status: Current Every Day Smoker    Packs/day: 0.50    Types: Cigarettes  . Smokeless tobacco: Never Used  Substance and Sexual Activity  . Alcohol use: No  . Drug use: No  . Sexual activity: Not on file  Lifestyle  . Physical activity:    Days per week: Not on file    Minutes per session: Not on file  . Stress: Not on file  Relationships  . Social connections:    Talks on phone: Not on file    Gets together: Not on file    Attends religious service: Not on file    Active member of club or organization: Not on file    Attends meetings of clubs or organizations: Not on file    Relationship status: Not on file  Other Topics Concern  . Not on file  Social History Narrative  . Not on file   Additional Social History:    Allergies:  No Known Allergies  Labs:  Results for orders placed or performed during the hospital encounter of 10/30/17 (from the past 48 hour(s))  Basic metabolic panel     Status: Abnormal   Collection Time: 11/12/17  3:29 AM  Result Value Ref Range   Sodium 135 135 - 145 mmol/L   Potassium 4.2 3.5 - 5.1 mmol/L   Chloride 101 101 - 111 mmol/L   CO2 28 22 - 32 mmol/L   Glucose, Bld 94 65 - 99 mg/dL   BUN 32 (H) 6 - 20 mg/dL   Creatinine, Ser 4.52 (H) 0.61 - 1.24 mg/dL   Calcium 7.6 (L) 8.9 - 10.3 mg/dL   GFR calc non Af Amer 16 (L) >60 mL/min   GFR calc Af Amer 19 (L) >60 mL/min    Comment: (NOTE) The eGFR has been calculated using the CKD EPI equation. This calculation has not been validated in all clinical situations. eGFR's persistently <60 mL/min signify possible Chronic Kidney Disease.    Anion gap 6 5 - 15    Comment: Performed at [facility], [address] [place], [place] [address]  CBC     Status: Abnormal   Collection Time: 11/12/17  3:29 AM  Result Value Ref Range   WBC 16.3 (H) 3.8 - 10.6 K/uL   RBC 1.90 (L) 4.40 - 5.90 MIL/uL   Hemoglobin 5.7 (L) 13.0 - 18.0 g/dL   HCT 16.6 (L) 40.0 - 52.0 %   MCV 87.1 80.0 - 100.0 fL   MCH 29.7 26.0 - 34.0 pg   MCHC 34.2 32.0 - 36.0 g/dL   RDW 14.8 (H) 11.5 - 14.5 %   Platelets 389 150 - 440 K/uL    Comment: Performed at [facility], [address] [place], [place] [address]  Magnesium     Status: None   Collection Time: 11/12/17  3:29 AM  Result Value Ref Range   Magnesium 2.0 1.7 - 2.4 mg/dL    Comment: Performed at [facility], [address] [place], [place] [address]  Vitamin B12     Status: None   Collection Time: 11/12/17  3:29 AM  Result Value Ref Range   Vitamin B-12 358 180 - 914 pg/mL    Comment: (NOTE) This assay is not validated for testing neonatal or myeloproliferative syndrome specimens for Vitamin B12 levels. Performed at [facility], [place] [address], [place], [place] [address]   Folate     Status: None   Collection Time: 11/12/17  3:29 AM  Result Value Ref Range   Folate 7.5 >5.9 ng/mL    Comment: Performed at [facility], [address] [place], [place] [address]  Prepare RBC     Status: None   Collection Time: 11/12/17  5:22 AM  Result Value Ref Range   Order Confirmation      ORDER PROCESSED BY BLOOD BANK Performed at [facility], [address] [place], [place] [address]   Hemoglobin and hematocrit, blood     Status: Abnormal   Collection Time: 11/12/17  4:31 PM  Result Value Ref Range   Hemoglobin 7.8 (L) 13.0 - 18.0 g/dL    Comment: RESULT REPEATED AND VERIFIED   HCT 23.3 (L) 40.0 - 52.0 %    Comment: Performed at [facility], [address] [place], [place] [address]  Urinalysis, Routine w reflex microscopic     Status: Abnormal   Collection Time: 11/12/17  8:30 PM  Result Value Ref  Range   Color, Urine AMBER (A) YELLOW    Comment: BIOCHEMICALS MAY BE AFFECTED BY COLOR   APPearance CLOUDY (A) CLEAR   Specific Gravity, Urine 1.019 1.005 - 1.030   pH 6.0 5.0 - 8.0   Glucose, UA 50 (A) NEGATIVE mg/dL   Hgb urine dipstick LARGE (A) NEGATIVE   Bilirubin Urine NEGATIVE NEGATIVE   Ketones, ur NEGATIVE NEGATIVE mg/dL   Protein, ur 100 (A) NEGATIVE mg/dL   Nitrite NEGATIVE NEGATIVE   Leukocytes, UA TRACE (A) NEGATIVE   RBC / HPF 6-30 0 - 5 RBC/hpf   WBC, UA TOO NUMEROUS TO COUNT 0 - 5 WBC/hpf   Bacteria, UA RARE (A) NONE SEEN   Squamous Epithelial / LPF 0-5 (A) NONE SEEN    Comment: Performed at [facility], [address] [place], [place] [address]  CBC     Status: Abnormal   Collection Time: 11/13/17  3:34 AM  Result Value Ref Range   WBC 17.0 (H) 3.8 - 10.6 K/uL   RBC 2.41 (L) 4.40 - 5.90 MIL/uL   Hemoglobin 7.1 (L) 13.0 - 18.0 g/dL   HCT 21.2 (L) 40.0 - 52.0 %   MCV 88.1 80.0 - 100.0 fL   MCH 29.7 26.0 - 34.0 pg   MCHC 33.7 32.0 - 36.0 g/dL   RDW 14.5 11.5 - 14.5 %   Platelets 411 150 - 440 K/uL    Comment: Performed at [facility], [address] [place],  [address]  Basic metabolic panel     Status: Abnormal   Collection Time: 11/13/17  3:34 AM  Result Value Ref Range   Sodium 132 (L) 135 - 145 mmol/L   Potassium 4.8 3.5 - 5.1 mmol/L   Chloride 98 (  L) 101 - 111 mmol/L   CO2 26 22 - 32 mmol/L   Glucose, Bld 90 65 - 99 mg/dL   BUN 49 (H) 6 - 20 mg/dL   Creatinine, Ser 6.50 (H) 0.61 - 1.24 mg/dL   Calcium 7.6 (L) 8.9 - 10.3 mg/dL   GFR calc non Af Amer 11 (L) >60 mL/min   GFR calc Af Amer 12 (L) >60 mL/min    Comment: (NOTE) The eGFR has been calculated using the CKD EPI equation. This calculation has not been validated in all clinical situations. eGFR's persistently <60 mL/min signify possible Chronic Kidney Disease.    Anion gap 8 5 - 15    Comment: Performed at [facility], [address]  [place], [place] [address]    Current Facility-Administered Medications  Medication Dose Route Frequency Provider Last Rate Last Dose  . ALPRAZolam Duanne Moron) tablet 0.5 mg  0.5 mg Oral TID PRN Wilhelmina Mcardle, MD   0.5 mg at 11/12/17 2331  . bisacodyl (DULCOLAX) suppository 10 mg  10 mg Rectal Daily PRN Dorene Sorrow S, NP      . ciprofloxacin (CIPRO) tablet 500 mg  500 mg Oral q1800 Salary, Montell D, MD   500 mg at 11/13/17 1735  . dextrose 50 % solution 25 mL  25 mL Intravenous PRN Dorene Sorrow S, NP   25 mL at 10/31/17 1830  . feeding supplement (ENSURE ENLIVE) (ENSURE ENLIVE) liquid 237 mL  237 mL Oral TID BM Wilhelmina Mcardle, MD   237 mL at 11/12/17 2143  . ferrous sulfate tablet 325 mg  325 mg Oral BID WC Salary, Montell D, MD   325 mg at 11/13/17 1735  . gabapentin (NEURONTIN) capsule 100 mg  100 mg Oral TID Murlean Iba, MD   100 mg at 11/13/17 1002  . lactulose (CHRONULAC) 10 GM/15ML solution 30 g  30 g Oral BID Salary, Montell D, MD      . morphine 2 MG/ML injection 2-4 mg  2-4 mg Intravenous Q3H PRN Schnier, Dolores Lory, MD   2 mg at 11/13/17 1634  . morphine 2 MG/ML injection           . morphine 2 MG/ML injection           . multivitamin with minerals tablet 1 tablet  1 tablet Oral Daily Wilhelmina Mcardle, MD   1 tablet at 11/13/17 1001  . nicotine (NICODERM CQ - dosed in mg/24 hours) patch 21 mg  21 mg Transdermal Daily Esco, Miechia A, MD   21 mg at 11/13/17 1003  . nystatin cream (MYCOSTATIN)   Topical TID PRN Candelaria Stagers, RPH      . oxyCODONE (OXYCONTIN) 12 hr tablet 80 mg  80 mg Oral Q12H Wilhelmina Mcardle, MD   80 mg at 11/13/17 1001  . QUEtiapine (SEROQUEL) tablet 25 mg  25 mg Oral QHS Conforti, Zevin Nevares, DO   25 mg at 11/12/17 2143  . senna (SENOKOT) tablet 17.2 mg  2 tablet Oral QHS Salary, Montell D, MD      . sennosides (SENOKOT) 8.8 MG/5ML syrup 5 mL  5 mL Per Tube BID PRN Tukov, Magadalene S, NP      . sodium chloride flush (NS) 0.9 % injection 10-40 mL  10-40 mL  Intracatheter Q12H Conforti, Feliberto Stockley, DO   10 mL at 11/13/17 1006  . vitamin C (ASCORBIC ACID) tablet 250 mg  250 mg Oral BID Salary, Avel Peace, MD   250 mg  at 11/13/17 1000    Musculoskeletal: Strength & Muscle Tone: decreased Gait & Station: unable to stand Patient leans: N/A  Psychiatric Specialty Exam: Physical Exam  Nursing note and vitals reviewed. Constitutional: He appears well-developed and well-nourished.  HENT:  Head: Normocephalic and atraumatic.  Eyes: Pupils are equal, round, and reactive to light. Conjunctivae are normal.  Neck: Normal range of motion.  Cardiovascular: Regular rhythm and normal heart sounds.  Respiratory: Effort normal. No respiratory distress.  GI: Soft.  Musculoskeletal: Normal range of motion.       Legs: Neurological: He is alert.  Skin: Skin is warm and dry.  Psychiatric: He has a normal mood and affect. His speech is normal and behavior is normal. Judgment and thought content normal. Cognition and memory are normal.    Review of Systems  Constitutional: Negative.   HENT: Negative.   Eyes: Negative.   Respiratory: Negative.   Cardiovascular: Negative.   Gastrointestinal: Negative.   Musculoskeletal: Positive for back pain and joint pain.  Skin: Negative.   Neurological: Negative.   Psychiatric/Behavioral: Negative.     Blood pressure (!) 154/83, pulse (!) 111, temperature 99.4 F (37.4 C), resp. rate 15, height '5\' 10"'  (1.778 m), weight 78.9 kg (174 lb), SpO2 91 %.Body mass index is 24.97 kg/m.  General Appearance: Casual  Eye Contact:  Good  Speech:  Clear and Coherent  Volume:  Decreased  Mood:  Euthymic  Affect:  Appropriate  Thought Process:  Goal Directed  Orientation:  Full (Time, Place, and Person)  Thought Content:  Logical  Suicidal Thoughts:  No  Homicidal Thoughts:  No  Memory:  Immediate;   Fair Recent;   Fair Remote;   Fair  Judgement:  Impaired  Insight:  Shallow  Psychomotor Activity:  Normal  Concentration:   Concentration: Fair  Recall:  [employer] of Knowledge:  Fair  Language:  Fair  Akathisia:  No  Handed:  Right  AIMS (if indicated):     Assets:  Desire for Improvement Housing Resilience Social Support  ADL's:  Impaired  Cognition:  WNL  Sleep:        Treatment Plan Summary: Daily contact with patient to assess and evaluate symptoms and progress in treatment, Medication management and Plan 27 year old man recovering from multiple severe complications probably from drug abuse.  Patient adamantly denies suicidal ideation.  He has denied it for the last couple days to other providers.  He does not report symptoms of depression or show signs consistent with depression or psychosis.  Patient does not appear to require the sitter or the suicide precautions.  Tried to do a little supportive counseling.  Please reconsult if needed.  Otherwise we will sign off for the time being as the patient may be ready for discharge soon.  Discontinue the orders for sitter and suicide precautions.  Patient obviously should be getting some kind of treatment for his substance abuse but at this point is refusing to acknowledge her problem.  This will be a longer term issue for him.  Disposition: No evidence of imminent risk to self or others at present.   Patient does not meet criteria for psychiatric inpatient admission.  Alethia Berthold, MD 11/13/2017 6:17 PM

## 2017-11-13 NOTE — Progress Notes (Signed)
[employer] - [place] [facility]   PATIENT NAME: Joshua [name]    MR#:  [id number]  DATE OF BIRTH:  05/24/1991  SUBJECTIVE:  CHIEF COMPLAINT:   Chief Complaint  Patient presents with  . Drug Overdose  Patient denies any suicidal thoughts, for hemodialysis later today, psychiatry to see, urology input appreciated, MRI of the T and L-spine noted for rhabdomyolysis, hemoglobin noted-check H&H every 8 hours and transfuse as needed  REVIEW OF SYSTEMS:  CONSTITUTIONAL: No fever, fatigue or weakness.  EYES: No blurred or double vision.  EARS, NOSE, AND THROAT: No tinnitus or ear pain.  RESPIRATORY: No cough, shortness of breath, wheezing or hemoptysis.  CARDIOVASCULAR: No chest pain, orthopnea, edema.  GASTROINTESTINAL: No nausea, vomiting, diarrhea or abdominal pain.  GENITOURINARY: No dysuria, hematuria.  ENDOCRINE: No polyuria, nocturia,  HEMATOLOGY: No anemia, easy bruising or bleeding SKIN: No rash or lesion. MUSCULOSKELETAL: No joint pain or arthritis.   NEUROLOGIC: No tingling, numbness, weakness.  PSYCHIATRY: No anxiety or depression.   ROS  DRUG ALLERGIES:  No Known Allergies  VITALS:  Blood pressure (!) 160/95, pulse 91, temperature 99.6 F (37.6 C), temperature source Oral, resp. rate 16, height 5\' 10"  (1.778 m), weight 79.3 kg (174 lb 13.2 oz), SpO2 98 %.  PHYSICAL EXAMINATION:  GENERAL:  27 y.o.-year-old patient lying in the bed with no acute distress.  EYES: Pupils equal, round, reactive to light and accommodation. No scleral icterus. Extraocular muscles intact.  HEENT: Head atraumatic, normocephalic. Oropharynx and nasopharynx clear.  NECK:  Supple, no jugular venous distention. No thyroid enlargement, no tenderness.  LUNGS: Normal breath sounds bilaterally, no wheezing, rales,rhonchi or crepitation. No use of accessory muscles of respiration.  CARDIOVASCULAR: S1, S2 normal. No murmurs, rubs, or gallops.  ABDOMEN: Soft, nontender, nondistended. Bowel  sounds present. No organomegaly or mass.  EXTREMITIES: No pedal edema, cyanosis, or clubbing.  NEUROLOGIC: Cranial nerves II through XII are intact. Muscle strength 5/5 in all extremities. Sensation intact. Gait not checked.  PSYCHIATRIC: The patient is alert and oriented x 3.  SKIN: No obvious rash, lesion, or ulcer.   Physical Exam LABORATORY PANEL:   CBC Recent Labs  Lab 11/13/17 0334  WBC 17.0*  HGB 7.1*  HCT 21.2*  PLT 411   ------------------------------------------------------------------------------------------------------------------  Chemistries  Recent Labs  Lab 11/09/17 0444  11/12/17 0329 11/13/17 0334  NA 138   < > 135 132*  K 4.0   < > 4.2 4.8  CL 104   < > 101 98*  CO2 23   < > 28 26  GLUCOSE 107*   < > 94 90  BUN 51*   < > 32* 49*  CREATININE 5.33*   < > 4.52* 6.50*  CALCIUM 8.5*   < > 7.6* 7.6*  MG  --    < > 2.0  --   AST 308*  --   --   --   ALT 300*  --   --   --   ALKPHOS 50  --   --   --   BILITOT 0.9  --   --   --    < > = values in this interval not displayed.   ------------------------------------------------------------------------------------------------------------------  Cardiac Enzymes No results for input(s): TROPONINI in the last 168 hours. ------------------------------------------------------------------------------------------------------------------  RADIOLOGY:  Mr Thoracic Spine Wo Contrast  Result Date: 11/12/2017 CLINICAL DATA:  Back pain. 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 EXAM: MRI THORACIC AND LUMBAR SPINE WITHOUT CONTRAST TECHNIQUE: Multiplanar and multiecho pulse sequences of the thoracic and lumbar spine were obtained without intravenous contrast. COMPARISON:  CT abdomen pelvis dated September 29, 2016. FINDINGS: MRI THORACIC SPINE FINDINGS Alignment:  Physiologic. Vertebrae: No fracture, evidence of discitis,  or bone lesion. Cord:  Normal signal and morphology. Paraspinal and other soft tissues: Small bilateral pleural effusions. Disc levels: Normal disc spaces. No spinal canal or neuroforaminal stenosis at any level. MRI LUMBAR SPINE FINDINGS Segmentation:  Standard. Alignment:  Physiologic. Vertebrae:  No fracture, evidence of discitis, or bone lesion. Conus medullaris and cauda equina: Conus extends to the L1 level. Conus and cauda equina appear normal. Paraspinal and other soft tissues: Prominent edema within the bilateral paraspinous muscles without focal fluid collection. There are small areas of intramuscular hemorrhage within the left paraspinous muscles. Prominent subcutaneous edema throughout the lower back. Nonspecific presacral edema. Disc levels: T12-L1 to L3-L4:  Negative. L4-L5: Small central disc protrusion. Mild left lateral recess and neuroforaminal stenosis. No spinal canal or right neuroforaminal stenosis. L5-S1: Tiny central disc protrusion and annular fissure. No stenosis. IMPRESSION: THORACIC SPINE: 1. No acute abnormality.  No significant degenerative changes. 2. Small bilateral pleural effusions. LUMBAR SPINE: 1. Prominent edema within the bilateral paraspinous muscles likely reflects rhabdomyolysis given clinical history. Small areas of intramuscular hemorrhage within the left paraspinous muscles. No focal fluid collection. 2. Mild left lateral recess and neuroforaminal stenosis at L4-L5 due to small disc protrusion. No high-grade spinal canal or neuroforaminal stenosis in the lumbar spine. 3. Annular fissure at L5-S1. Electronically Signed   By: Obie Dredge M.D.   On: 11/12/2017 20:23   Mr Lumbar Spine Wo Contrast  Result Date: 11/12/2017 CLINICAL DATA:  Back pain. 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 EXAM: MRI THORACIC AND LUMBAR SPINE WITHOUT CONTRAST TECHNIQUE:  Multiplanar and multiecho pulse sequences of the thoracic and lumbar spine were obtained without intravenous contrast. COMPARISON:  CT abdomen pelvis dated September 29, 2016. FINDINGS: MRI THORACIC SPINE FINDINGS Alignment:  Physiologic. Vertebrae: No fracture, evidence of discitis, or bone lesion. Cord:  Normal signal and morphology. Paraspinal and other soft tissues: Small bilateral pleural effusions. Disc levels: Normal disc spaces. No spinal canal or neuroforaminal stenosis at any level. MRI LUMBAR SPINE FINDINGS Segmentation:  Standard. Alignment:  Physiologic. Vertebrae:  No fracture, evidence of discitis, or bone lesion. Conus medullaris and cauda equina: Conus extends to the L1 level. Conus and cauda equina appear normal. Paraspinal and other soft tissues: Prominent edema within the bilateral paraspinous muscles without focal fluid collection. There are small areas of intramuscular hemorrhage within the left paraspinous muscles. Prominent subcutaneous edema throughout the lower back. Nonspecific presacral edema. Disc levels: T12-L1 to L3-L4:  Negative. L4-L5: Small central disc protrusion. Mild left lateral recess and neuroforaminal stenosis. No spinal canal or right neuroforaminal stenosis. L5-S1: Tiny central disc protrusion and annular fissure. No stenosis. IMPRESSION: THORACIC SPINE: 1. No acute abnormality.  No significant degenerative changes. 2. Small bilateral pleural effusions. LUMBAR SPINE: 1. Prominent edema within the bilateral paraspinous muscles likely reflects rhabdomyolysis given clinical history. Small areas of intramuscular hemorrhage within the left paraspinous muscles. No focal fluid collection. 2. Mild left lateral recess and neuroforaminal stenosis at L4-L5 due to small disc protrusion. No high-grade spinal canal or neuroforaminal stenosis in the lumbar spine. 3. Annular fissure at L5-S1. Electronically Signed  By: Obie DredgeWilliam T Derry M.D.   On: 11/12/2017 20:23    ASSESSMENT AND PLAN:   27 year old male with known history of asthma, tobacco abuse presents to hospital secondary to an unresponsive state.  1. Acutecardiac arrest  Resolved  Most likely secondary to polysubstance overdose    2. 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 Rhabdomyolysis, acute Resolved Secondary topossible ischemic right leg with compartment syndrome. [facility] by revision with AKA on November 11, 2017, patient recovering well  4. Sepsis, acute Resolved Treated with course of IV Unasyn/vancomycin  5. DIC, acute Resolved Secondary to sepsis  6. 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  Acute suicidal thoughts Denies depression or suicidal ideation at this time Compounded by polysubstance abuse-patient with dual diagnosis Continue one-to-one sitter, suicide precautions, psychiatry to see   8.  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  Acute on chronic anemia  Improved S/P 2 units PRBCs Check H&H every 8 hours, guaiac all stools, CBC daily, transfuse as needed   10.  Acute Enterobacter infection Noted on sputum culture 314 Complete Cipro course for 5 days  Condition stable Prognosis fair Disposition to SNF versus home with home health services in the care of family 2-4 days  All the records are reviewed and case discussed with Care Management/Social Workerr. Management plans discussed with the patient, family and they are in agreement.  CODE STATUS: full  TOTAL TIME TAKING CARE OF THIS PATIENT: 35 minutes.     POSSIBLE D/C IN 2-4 DAYS, DEPENDING  ON CLINICAL CONDITION.   Evelena AsaMontell D Salary M.D on 11/13/2017   Between 7am to 6pm - Pager - [phone]  After 6pm go to [email or web address] - password [email or web address]  [facility]  Office  [phone]  CC: Primary care physician; Patient, No Pcp Per  Note: This dictation was prepared with Dragon dictation along with smaller phrase technology. Any transcriptional errors that result from this process are unintentional.

## 2017-11-13 NOTE — Progress Notes (Signed)
HD STARTED  

## 2017-11-14 LAB — BASIC METABOLIC PANEL
Anion gap: 8 (ref 5–15)
BUN: 30 mg/dL — AB (ref 6–20)
CALCIUM: 7.7 mg/dL — AB (ref 8.9–10.3)
CO2: 27 mmol/L (ref 22–32)
Chloride: 101 mmol/L (ref 101–111)
Creatinine, Ser: 5.24 mg/dL — ABNORMAL HIGH (ref 0.61–1.24)
GFR calc Af Amer: 16 mL/min — ABNORMAL LOW (ref >60)
GFR, EST NON AFRICAN AMERICAN: 14 mL/min — AB (ref >60)
GLUCOSE: 88 mg/dL (ref 65–99)
Potassium: 4.7 mmol/L (ref 3.5–5.1)
Sodium: 136 mmol/L (ref 135–145)

## 2017-11-14 LAB — CBC
HCT: 21.9 % — ABNORMAL LOW (ref 40.0–52.0)
Hemoglobin: 7.3 g/dL — ABNORMAL LOW (ref 13.0–18.0)
MCH: 29.4 pg (ref 26.0–34.0)
MCHC: 33.1 g/dL (ref 32.0–36.0)
MCV: 88.7 fL (ref 80.0–100.0)
PLATELETS: 441 10*3/uL — AB (ref 150–440)
RBC: 2.47 MIL/uL — ABNORMAL LOW (ref 4.40–5.90)
RDW: 14.7 % — AB (ref 11.5–14.5)
WBC: 18.7 10*3/uL — ABNORMAL HIGH (ref 3.8–10.6)

## 2017-11-14 LAB — CULTURE, BLOOD (ROUTINE X 2)
CULTURE: NO GROWTH
Special Requests: ADEQUATE

## 2017-11-14 LAB — HEMOGLOBIN AND HEMATOCRIT, BLOOD
HCT: 22.1 % — ABNORMAL LOW (ref 40.0–52.0)
Hemoglobin: 7.4 g/dL — ABNORMAL LOW (ref 13.0–18.0)

## 2017-11-14 LAB — OCCULT BLOOD X 1 CARD TO LAB, STOOL: Fecal Occult Bld: NEGATIVE

## 2017-11-14 MED ORDER — FUROSEMIDE 10 MG/ML IJ SOLN
80.0000 mg | Freq: Once | INTRAMUSCULAR | Status: AC
  Administered 2017-11-14: 80 mg via INTRAVENOUS
  Filled 2017-11-14: qty 8

## 2017-11-14 NOTE — Progress Notes (Signed)
POD #4  Subjective/Chief Complaint: Doing Ok. Complains of pain. Refuses to keep dressing on right AKA stump.   Objective: Vital signs in last 24 hours: Temp:  [99 F (37.2 C)-100.3 F (37.9 C)] 99 F (37.2 C) (03/23 0512) Pulse Rate:  [91-111] 92 (03/23 0512) Resp:  [7-20] 16 (03/23 0512) BP: (128-169)/(74-97) 128/85 (03/23 0512) SpO2:  [91 %-96 %] 96 % (03/23 0512) Weight:  [78.9 kg (174 lb)-79.3 kg (174 lb 13.2 oz)] 78.9 kg (174 lb) (03/22 1451) Last BM Date: 11/08/17  Intake/Output from previous day: 03/22 0701 - 03/23 0700 In: 120 [P.O.:120] Out: 2000  Intake/Output this shift: No intake/output data recorded.  General appearance: alert and no distress Extremities: Right AKA- incision- C/D/I, edema of leg, appropriately tender  Lab Results:  Recent Labs    11/13/17 0334 11/13/17 1923 11/14/17 0721  WBC 17.0*  --  18.7*  HGB 7.1* 7.3* 7.3*  HCT 21.2* 22.0* 21.9*  PLT 411  --  441*   BMET Recent Labs    11/13/17 0334 11/14/17 0721  NA 132* 136  K 4.8 4.7  CL 98* 101  CO2 26 27  GLUCOSE 90 88  BUN 49* 30*  CREATININE 6.50* 5.24*  CALCIUM 7.6* 7.7*   PT/INR No results for input(s): LABPROT, INR in the last 72 hours. ABG No results for input(s): PHART, HCO3 in the last 72 hours.  Invalid input(s): PCO2, PO2  Studies/Results: Mr Thoracic Spine Wo Contrast  Result Date: 11/12/2017 CLINICAL DATA:  Back pain. 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 EXAM: MRI THORACIC AND LUMBAR SPINE WITHOUT CONTRAST TECHNIQUE: Multiplanar and multiecho pulse sequences of the thoracic and lumbar spine were obtained without intravenous contrast. COMPARISON:  CT abdomen pelvis dated September 29, 2016. FINDINGS: MRI THORACIC SPINE FINDINGS Alignment:  Physiologic. Vertebrae: No fracture, evidence of discitis, or bone lesion. Cord:  Normal signal and morphology.  Paraspinal and other soft tissues: Small bilateral pleural effusions. Disc levels: Normal disc spaces. No spinal canal or neuroforaminal stenosis at any level. MRI LUMBAR SPINE FINDINGS Segmentation:  Standard. Alignment:  Physiologic. Vertebrae:  No fracture, evidence of discitis, or bone lesion. Conus medullaris and cauda equina: Conus extends to the L1 level. Conus and cauda equina appear normal. Paraspinal and other soft tissues: Prominent edema within the bilateral paraspinous muscles without focal fluid collection. There are small areas of intramuscular hemorrhage within the left paraspinous muscles. Prominent subcutaneous edema throughout the lower back. Nonspecific presacral edema. Disc levels: T12-L1 to L3-L4:  Negative. L4-L5: Small central disc protrusion. Mild left lateral recess and neuroforaminal stenosis. No spinal canal or right neuroforaminal stenosis. L5-S1: Tiny central disc protrusion and annular fissure. No stenosis. IMPRESSION: THORACIC SPINE: 1. No acute abnormality.  No significant degenerative changes. 2. Small bilateral pleural effusions. LUMBAR SPINE: 1. Prominent edema within the bilateral paraspinous muscles likely reflects rhabdomyolysis given clinical history. Small areas of intramuscular hemorrhage within the left paraspinous muscles. No focal fluid collection. 2. Mild left lateral recess and neuroforaminal stenosis at L4-L5 due to small disc protrusion. No high-grade spinal canal or neuroforaminal stenosis in the lumbar spine. 3. Annular fissure at L5-S1. Electronically Signed   By: Obie Dredge M.D.   On: 11/12/2017 20:23   Mr Lumbar Spine Wo Contrast  Result Date: 11/12/2017 CLINICAL DATA:  Back pain. 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 EXAM: MRI THORACIC AND LUMBAR SPINE WITHOUT CONTRAST TECHNIQUE: Multiplanar and multiecho pulse sequences of the thoracic and  lumbar spine were obtained without intravenous contrast. COMPARISON:  CT abdomen pelvis dated September 29, 2016. FINDINGS: MRI THORACIC SPINE FINDINGS Alignment:  Physiologic. Vertebrae: No fracture, evidence of discitis, or bone lesion. Cord:  Normal signal and morphology. Paraspinal and other soft tissues: Small bilateral pleural effusions. Disc levels: Normal disc spaces. No spinal canal or neuroforaminal stenosis at any level. MRI LUMBAR SPINE FINDINGS Segmentation:  Standard. Alignment:  Physiologic. Vertebrae:  No fracture, evidence of discitis, or bone lesion. Conus medullaris and cauda equina: Conus extends to the L1 level. Conus and cauda equina appear normal. Paraspinal and other soft tissues: Prominent edema within the bilateral paraspinous muscles without focal fluid collection. There are small areas of intramuscular hemorrhage within the left paraspinous muscles. Prominent subcutaneous edema throughout the lower back. Nonspecific presacral edema. Disc levels: T12-L1 to L3-L4:  Negative. L4-L5: Small central disc protrusion. Mild left lateral recess and neuroforaminal stenosis. No spinal canal or right neuroforaminal stenosis. L5-S1: Tiny central disc protrusion and annular fissure. No stenosis. IMPRESSION: THORACIC SPINE: 1. No acute abnormality.  No significant degenerative changes. 2. Small bilateral pleural effusions. LUMBAR SPINE: 1. Prominent edema within the bilateral paraspinous muscles likely reflects rhabdomyolysis given clinical history. Small areas of intramuscular hemorrhage within the left paraspinous muscles. No focal fluid collection. 2. Mild left lateral recess and neuroforaminal stenosis at L4-L5 due to small disc protrusion. No high-grade spinal canal or neuroforaminal stenosis in the lumbar spine. 3. Annular fissure at L5-S1. Electronically Signed   By: Obie Dredge M.D.   On: 11/12/2017 20:23    Anti-infectives: Anti-infectives (From admission, onward)   Start     Dose/Rate  Route Frequency Ordered Stop   11/13/17 0500  ceFAZolin (ANCEF) IVPB 1 g/50 mL premix    Note to Pharmacy:  Send with pt to OR   1 g 100 mL/hr over 30 Minutes Intravenous On call 11/12/17 2012 11/13/17 0639   11/12/17 1500  ciprofloxacin (CIPRO) tablet 500 mg     500 mg Oral Daily-1800 11/12/17 1413 11/17/17 1759   11/10/17 0000  ceFAZolin (ANCEF) IVPB 2g/100 mL premix    Note to Pharmacy:  Send with pt to OR   2 g 200 mL/hr over 30 Minutes Intravenous On call 11/09/17 1831 11/10/17 1653   10/31/17 1600  vancomycin (VANCOCIN) 1,250 mg in sodium chloride 0.9 % 250 mL IVPB  Status:  Discontinued     1,250 mg 166.7 mL/hr over 90 Minutes Intravenous Every 24 hours 10/30/17 1909 10/31/17 1528   10/31/17 1600  vancomycin (VANCOCIN) IVPB 1000 mg/200 mL premix  Status:  Discontinued     1,000 mg 200 mL/hr over 60 Minutes Intravenous Every 24 hours 10/31/17 1528 11/01/17 1106   10/30/17 1800  Ampicillin-Sulbactam (UNASYN) 3 g in sodium chloride 0.9 % 100 mL IVPB  Status:  Discontinued     3 g 200 mL/hr over 30 Minutes Intravenous Every 6 hours 10/30/17 1750 11/06/17 1007   10/30/17 1800  vancomycin (VANCOCIN) IVPB 1000 mg/200 mL premix     1,000 mg 200 mL/hr over 60 Minutes Intravenous  Once 10/30/17 1750 10/30/17 2005   10/30/17 1315  piperacillin-tazobactam (ZOSYN) IVPB 3.375 g     3.375 g 100 mL/hr over 30 Minutes Intravenous  Once 10/30/17 1314 10/30/17 1348      Assessment/Plan: 27 year old man status post4Day Post-Op:Right above-the-knee amputationandPOD#11:Right knee disarticulation with amputation-  stable   Pain control.  PT as tolerated. Explained the importance of keeping the dressing on the stump to prevent infection and to decrease edema for eventual prosthesis.  LOS: 15 days    Bertram Denversco, Jameal Razzano A 11/14/2017

## 2017-11-14 NOTE — Evaluation (Addendum)
Physical Therapy Evaluation Patient Details Name: Joshua [name] Joshua [name] MRN: [id number] DOB: 03/24/1991 Today's Date: 11/14/2017   History of Present Illness  Joshua [name] is Joshua 26yo white male, PMH: asthma, found unresponsive on floor of hotel room adjacent white powder rolled-up [place] currency. In ED pt found to be in V-fib arrest, UDS (+) for opiates, cocaine, THC. Family attests to history of drug abuse, alcohol abuse, denies IV drug use. 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 Pt with significan tanemia past 2 days limiting ability of PT to evaluate until today. Also followed by psych d/t SI, which patient denies.    Clinical Impression  Pt admitted with above diagnosis. Pt currently with functional limitations due to the deficits listed below (see "PT Problem List"). Upon entry, the patient is received semirecumbent in bed, no family/caregiver present. The pt is awake and agreeable to participate, in spite of 10/10 pain. The pt is alert and oriented x3, pleasant, conversational, and following simple commands consistently, does require additional processing time. Bed mobility, transfers, and AMB are performed at mod-I to supervision level with RW, moderate additional effort required and limited by pain and scrotal/residual limb edema. Mild Tachycardia 112bpm seated on BSC, monitored in light of anemia, but denies dizziness. Education commenced on post surgical precautions, limb positioning, and exercise program. Pt will benefit from skilled PT intervention to increase independence and safety with basic mobility in preparation for discharge to the venue listed below.       Follow Up Recommendations Home health PT    Equipment Recommendations  Rolling walker with 5" wheels    Recommendations for Other Services       Precautions / Restrictions Precautions Precautions: Fall;Other (comment) Precaution Comments: suicide precautions, AKA precautions.   Restrictions Weight Bearing Restrictions: Yes RLE Weight Bearing: Non weight bearing      Mobility  Bed Mobility Overal bed mobility: Needs Assistance Bed Mobility: Supine to Sit     Supine to sit: Supervision     General bed mobility comments: moderate additional time/effort required.   Transfers Overall transfer level: Needs assistance Equipment used: Rolling walker (2 wheeled) Transfers: Sit to/from Stand Sit to Stand: Min guard         General transfer comment: moderate additional time/effort required.   Ambulation/Gait Ambulation/Gait assistance: Min guard Ambulation Distance (Feet): 10 Feet Assistive device: Rolling walker (2 wheeled)       General Gait Details: bed to toilet  Stairs            Wheelchair Mobility    Modified Rankin (Stroke Patients Only)       Balance Overall balance assessment: Modified Independent;Mild deficits observed, not formally tested                                           Pertinent Vitals/Pain Pain Assessment: 0-10 Pain Score: 10-Worst pain ever Pain Location: Right residual limb, Left low back and flank Pain Descriptors / Indicators: Aching;Constant Pain Intervention(s): Limited activity within patient's tolerance;Monitored during session;Premedicated before session;Repositioned    Home Living Family/patient expects to be discharged to:: Private residence(Sister's mobile home ) Living Arrangements: Other relatives(sister, BIL, 3 nieces/nephews) Available Help at Discharge: Family Type of Home: Mobile home Home Access: Stairs to enter Entrance Stairs-Rails: Can reach both Entrance Stairs-Number of Steps: 2 Home Layout: One level Home Equipment:  None      Prior Function Level of Independence: Independent               Hand Dominance        Extremity/Trunk Assessment        Lower Extremity Assessment Lower Extremity Assessment: RLE deficits/detail;LLE  deficits/detail RLE Deficits / Details: significant postoperatwe edema LLE Deficits / Details: pedal edema     Cervical / Trunk Assessment Cervical / Trunk Assessment: (reports scrotal edema)  Communication   Communication: No difficulties  Cognition Arousal/Alertness: Awake/alert Behavior During Therapy: WFL for tasks assessed/performed Overall Cognitive Status: Within Functional Limits for tasks assessed                                        General Comments      Exercises Amputee Exercises Hip ABduction/ADduction: Supine;AAROM;Right;10 reps Hip Flexion/Marching: AAROM;Supine;Right;10 reps Other Exercises: Prone positioning for hip extension stretch 1x60sec (pain limiting)     Assessment/Plan    PT Assessment Patient needs continued PT services  PT Problem List Decreased strength;Decreased range of motion;Decreased activity tolerance;Decreased mobility;Decreased balance;Pain       PT Treatment Interventions Gait training;Balance training;Functional mobility training;Therapeutic activities;Therapeutic exercise;Stair training;Patient/family education    PT Goals (Current goals can be found in the Care Plan section)  Acute Rehab PT Goals Patient Stated Goal: get independent with AMB/basic mobility PT Goal Formulation: With patient Time For Goal Achievement: 11/28/17 Potential to Achieve Goals: Good    Frequency 7X/week   Barriers to discharge        Co-evaluation               AM-PAC PT "6 Clicks" Daily Activity  Outcome Measure Difficulty turning over in bed (including adjusting bedclothes, sheets and blankets)?: Joshua Lot Difficulty moving from lying on back to sitting on the side of the bed? : Joshua Lot Difficulty sitting down on and standing up from Joshua chair with arms (e.g., wheelchair, bedside commode, etc,.)?: Joshua Lot Help needed moving to and from Joshua bed to chair (including Joshua wheelchair)?: Joshua Lot Help needed walking in hospital room?: Joshua  Little Help needed climbing 3-5 steps with Joshua railing? : Total 6 Click Score: 12    End of Session Equipment Utilized During Treatment: Gait belt Activity Tolerance: Patient tolerated treatment well;Patient limited by pain;Patient limited by fatigue Patient left: in chair;with call bell/phone within reach;with chair alarm set Nurse Communication: Mobility status PT Visit Diagnosis: Difficulty in walking, not elsewhere classified (R26.2);Muscle weakness (generalized) (M62.81)    Time: 1610-9604 PT Time Calculation (min) (ACUTE ONLY): 50 min   Charges:   PT Evaluation $PT Eval Moderate Complexity: 1 Mod PT Treatments $Therapeutic Activity: 8-22 mins   PT G Codes:        10:49 AM, 2017/12/07 Rosamaria Lints, PT, DPT Physical Therapist - [facility]  [phone] (ASCOM)    Jerrol Helmers C 07-Dec-2017, 10:20 AM

## 2017-11-14 NOTE — Clinical Social Work Note (Signed)
Clinical Social Work Assessment  Patient Details  Name: Joshua [name] MRN: [id number] Date of Birth: 06-18-1991  Date of referral:  11/14/17               Reason for consult:  Discharge Planning, [employer], Financial Concerns, Substance Use/ETOH Abuse                Permission sought to share information with:    Permission granted to share information::     Name::        Agency::     Relationship::     Contact Information:     Housing/Transportation Living arrangements for the past 2 months:  Single Family Home Source of Information:  Patient, Medical Team Patient Interpreter Needed:  None Criminal Activity/Legal Involvement Pertinent to Current Situation/Hospitalization:  No - Comment as needed Significant Relationships:  Significant Other, Other Family Members, Friend, Siblings, Parents Lives with:  Siblings Do you feel safe going back to the place where you live?  Yes Need for family participation in patient care:  No (Coment)  Care giving concerns: Patient admitted after a possible drug overdose. Patient was under suicide precautions (now cleared). Patient has a recent leg amputation.    Social Worker assessment / plan:  The CSW met with the patient at bedside to discuss discharge planning and goals for care. The CSW introduced self and role in care. CSW explained the process for discharge planning with an RNCM as PT has recommended HHPT. The patient asked appropriate questions about assistance with gaining employment post-amputation. The CSW advised the patient that a HHSW order can be placed at discharge for continued assistance in the community.  The CSW inquired about the client's substance abuse history. The client reported that he sometimes would use cocaine recreationally, but he adamantly stated "I'm no addict." The CSW provided psychoeducation on the effects of cocaine on physical health, regardless of addiction, and the client listened without comment. The CSW  offered resources for continued discussion of adjustment to a new amputation. The CSW explained that the patient may have a grieving process for the loss of his leg and the the situation that led to the loss. The client admitted that he is "bummed" about the amputation; however, he expressed hope for continued adjustment and named multiple supportive family members. The CSW provided suggestions for ways to talk about his emotions regarding the adjustment with family should he not seek outpatient psychiatric care. The patient agreed to look at the resources.  The CSW is signing off. Please consult should additional needs arise.   Employment status:  Unemployed Forensic scientist:  Self Pay (Medicaid Pending) PT Recommendations:  Home with [place] / Referral to community resources:  Support Groups, Outpatient Substance Abuse Treatment Options, LME (Local Management Entity), Outpatient Psychiatric Care (Comment Required)  Patient/Family's Response to care: The patient was charming and thanked the CSW.  Patient/Family's Understanding of and Emotional Response to Diagnosis, Current Treatment, and Prognosis:  The patient is using denial as a defense mechanism, both for his substance use, and also for the potential effects of limb loss on his mental wellness. The client has some signs of hopefulness. The client is in the Pre-Contemplation Stage of Change regarding substance use.  Emotional Assessment Appearance:  Appears stated age Attitude/Demeanor/Rapport:  Guarded, Engaged, Self-Confident, Charismatic, Gracious Affect (typically observed):  Pleasant, Appropriate Orientation:  Oriented to Place, Oriented to Self, Oriented to  Time, Oriented to Situation Alcohol / Substance use:  Illicit Drugs  Psych involvement (Current and /or in the community):  Yes (Comment)(Psych consult for possible SI/Cleared by psych)  Discharge Needs  Concerns to be addressed:  Mental Health Concerns,  Substance Abuse Concerns, Adjustment to Illness, Coping/Stress Concerns, Financial / Insurance Concerns Readmission within the last 30 days:  No Current discharge risk:  Physical Impairment, Substance Abuse Barriers to Discharge:  Continued Medical Work up   [employer], LCSW 11/14/2017, 2:04 PM

## 2017-11-14 NOTE — Progress Notes (Signed)
Shift change, getting ready to go into pt's room to give report to oncoming nurse, heard pt fall. Found at foot of bed, denies hitting head. No apparent injury. VSS. Helped to chair then bed. R AKA stump dressing intact, no drainage seen to it. Pt med for pain and he's eating supper.  Spoke w/Mother, Bonita QuinLinda and Dr. Tobi BastosPyreddy.  Dr pyreddy asked for monitor v/s, make sure bed alarm on and siderails up.

## 2017-11-14 NOTE — Progress Notes (Signed)
   11/14/17 1540  Clinical Encounter Type  Visited With Family  Visit Type Follow-up  Spiritual Encounters  Spiritual Needs Emotional   While returning from another patient visit, chaplain encountered this patient's mother and her significant other in the hallway.  Chaplain utilized active and reflective listening as both individuals shared their reactions to patient's health and healing ahead.  Conversation around importance of support systems.  Patient's mother identified supports available to her, voiced hopes for support style to partner.  Patient's mother also shared thoughts around those who support and can speak for the patient.

## 2017-11-14 NOTE — Progress Notes (Signed)
[employer] [facility] [facility] [place] [facility]   PATIENT NAME: Joshua [name]    MR#:  [id number]  DATE OF BIRTH:  [birth year]  SUBJECTIVE:   No acute events overnight, status post temporary dialysis catheter placement yesterday. Patient still complaining of generalized aches and pains.  REVIEW OF SYSTEMS:    Review of Systems  Constitutional: Negative for chills and fever.  HENT: Negative for congestion and tinnitus.   Eyes: Negative for blurred vision and double vision.  Respiratory: Negative for cough, shortness of breath and wheezing.   Cardiovascular: Negative for chest pain, orthopnea and PND.  Gastrointestinal: Negative for abdominal pain, diarrhea, nausea and vomiting.  Genitourinary: Negative for dysuria and hematuria.  Neurological: Negative for dizziness, sensory change and focal weakness.  All other systems reviewed and are negative.   Nutrition: Regular Tolerating Diet: yes Tolerating PT: Await Eval.      DRUG ALLERGIES:  No Known Allergies  VITALS:  Blood pressure (!) 153/80, pulse (!) 103, temperature 99.7 F (37.6 C), temperature source Oral, resp. rate 19, height 5\' 10"  (1.778 m), weight 78.9 kg (174 lb), SpO2 94 %.  PHYSICAL EXAMINATION:   Physical Exam  GENERAL:  27 y.o.-year-old patient lying in bed in no acute distress.  EYES: Pupils equal, round, reactive to light and accommodation. No scleral icterus. Extraocular muscles intact.  HEENT: Head atraumatic, normocephalic. Oropharynx and nasopharynx clear.  NECK:  Supple, no jugular venous distention. No thyroid enlargement, no tenderness.  LUNGS: Normal breath sounds bilaterally, no wheezing, rales, rhonchi. No use of accessory muscles of respiration.  CARDIOVASCULAR: S1, S2 normal. No murmurs, rubs, or gallops.  ABDOMEN: Soft, nontender, nondistended. Bowel sounds present. No organomegaly or mass.  EXTREMITIES: No cyanosis, clubbing or edema b/l.   S/p Right AKA NEUROLOGIC: Cranial nerves II  through XII are intact. No focal Motor or sensory deficits b/l.  Globally weak.  PSYCHIATRIC: The patient is alert and oriented x 3.  SKIN: No obvious rash, lesion, or ulcer.  GU - + Scrotal Edema.   LABORATORY PANEL:   CBC Recent Labs  Lab 11/14/17 0721 11/14/17 1327  WBC 18.7*  --   HGB 7.3* 7.4*  HCT 21.9* 22.1*  PLT 441*  --    ------------------------------------------------------------------------------------------------------------------  Chemistries  Recent Labs  Lab 11/09/17 0444  11/12/17 0329  11/14/17 0721  NA 138   < > 135   < > 136  K 4.0   < > 4.2   < > 4.7  CL 104   < > 101   < > 101  CO2 23   < > 28   < > 27  GLUCOSE 107*   < > 94   < > 88  BUN 51*   < > 32*   < > 30*  CREATININE 5.33*   < > 4.52*   < > 5.24*  CALCIUM 8.5*   < > 7.6*   < > 7.7*  MG  --    < > 2.0  --   --   AST 308*  --   --   --   --   ALT 300*  --   --   --   --   ALKPHOS 50  --   --   --   --   BILITOT 0.9  --   --   --   --    < > = values in this interval not displayed.   ------------------------------------------------------------------------------------------------------------------  Cardiac Enzymes No results for  input(s): TROPONINI in the last 168 hours. ------------------------------------------------------------------------------------------------------------------  RADIOLOGY:  Mr Thoracic Spine Wo Contrast  Result Date: 11/12/2017 CLINICAL DATA:  Back pain. 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 EXAM: MRI THORACIC AND LUMBAR SPINE WITHOUT CONTRAST TECHNIQUE: Multiplanar and multiecho pulse sequences of the thoracic and lumbar spine were obtained without intravenous contrast. COMPARISON:  CT abdomen pelvis dated September 29, 2016. FINDINGS: MRI THORACIC SPINE FINDINGS Alignment:  Physiologic. Vertebrae: No fracture, evidence of discitis, or bone lesion. Cord:   Normal signal and morphology. Paraspinal and other soft tissues: Small bilateral pleural effusions. Disc levels: Normal disc spaces. No spinal canal or neuroforaminal stenosis at any level. MRI LUMBAR SPINE FINDINGS Segmentation:  Standard. Alignment:  Physiologic. Vertebrae:  No fracture, evidence of discitis, or bone lesion. Conus medullaris and cauda equina: Conus extends to the L1 level. Conus and cauda equina appear normal. Paraspinal and other soft tissues: Prominent edema within the bilateral paraspinous muscles without focal fluid collection. There are small areas of intramuscular hemorrhage within the left paraspinous muscles. Prominent subcutaneous edema throughout the lower back. Nonspecific presacral edema. Disc levels: T12-L1 to L3-L4:  Negative. L4-L5: Small central disc protrusion. Mild left lateral recess and neuroforaminal stenosis. No spinal canal or right neuroforaminal stenosis. L5-S1: Tiny central disc protrusion and annular fissure. No stenosis. IMPRESSION: THORACIC SPINE: 1. No acute abnormality.  No significant degenerative changes. 2. Small bilateral pleural effusions. LUMBAR SPINE: 1. Prominent edema within the bilateral paraspinous muscles likely reflects rhabdomyolysis given clinical history. Small areas of intramuscular hemorrhage within the left paraspinous muscles. No focal fluid collection. 2. Mild left lateral recess and neuroforaminal stenosis at L4-L5 due to small disc protrusion. No high-grade spinal canal or neuroforaminal stenosis in the lumbar spine. 3. Annular fissure at L5-S1. Electronically Signed   By: Obie DredgeWilliam T Derry M.D.   On: 11/12/2017 20:23   Mr Lumbar Spine Wo Contrast  Result Date: 11/12/2017 CLINICAL DATA:  Back pain. 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 EXAM: MRI THORACIC AND LUMBAR SPINE WITHOUT CONTRAST TECHNIQUE: Multiplanar and multiecho pulse  sequences of the thoracic and lumbar spine were obtained without intravenous contrast. COMPARISON:  CT abdomen pelvis dated September 29, 2016. FINDINGS: MRI THORACIC SPINE FINDINGS Alignment:  Physiologic. Vertebrae: No fracture, evidence of discitis, or bone lesion. Cord:  Normal signal and morphology. Paraspinal and other soft tissues: Small bilateral pleural effusions. Disc levels: Normal disc spaces. No spinal canal or neuroforaminal stenosis at any level. MRI LUMBAR SPINE FINDINGS Segmentation:  Standard. Alignment:  Physiologic. Vertebrae:  No fracture, evidence of discitis, or bone lesion. Conus medullaris and cauda equina: Conus extends to the L1 level. Conus and cauda equina appear normal. Paraspinal and other soft tissues: Prominent edema within the bilateral paraspinous muscles without focal fluid collection. There are small areas of intramuscular hemorrhage within the left paraspinous muscles. Prominent subcutaneous edema throughout the lower back. Nonspecific presacral edema. Disc levels: T12-L1 to L3-L4:  Negative. L4-L5: Small central disc protrusion. Mild left lateral recess and neuroforaminal stenosis. No spinal canal or right neuroforaminal stenosis. L5-S1: Tiny central disc protrusion and annular fissure. No stenosis. IMPRESSION: THORACIC SPINE: 1. No acute abnormality.  No significant degenerative changes. 2. Small bilateral pleural effusions. LUMBAR SPINE: 1. Prominent edema within the bilateral paraspinous muscles likely reflects rhabdomyolysis given clinical history. Small areas of intramuscular hemorrhage within  the left paraspinous muscles. No focal fluid collection. 2. Mild left lateral recess and neuroforaminal stenosis at L4-L5 due to small disc protrusion. No high-grade spinal canal or neuroforaminal stenosis in the lumbar spine. 3. Annular fissure at L5-S1. Electronically Signed   By: Obie Dredge M.D.   On: 11/12/2017 20:23     ASSESSMENT AND PLAN:   27 year old male with known  history of asthma, tobacco abuse, IVDA presents to hospital secondary to an unresponsive state.  1. Acutecardiac arrest - Most likely secondary to polysubstance overdose - resolved and pt. Presently stable.   2. Acuteend-stage renal disease  Secondary to cocaine induced rhabdomyolysis  -status post temporary dialysis catheter placement yesterday. Will need to be converted to perm-cath.  Cont. Dialysis as per Nephrology.  - pt. Needs and outpatient spot for HD.   3. Rhabdomyolysis, acute - Resolved, Secondary topossible ischemic right leg with compartment syndrome. - s/p [facility] 3/12/19followed by revision with AKA on November 11, 2017, patient recovering well  4. Sepsis, acute Resolved Treated with course of IV Unasyn/vancomycin  5. DIC, acute - resolved. No acute issue.   6. 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  Compounded by polysubstance abuse-patient with dual diagnosis - sitter discontinued.    8.Acute on chronic pain syndrome Compounded by right AKA - cont. Oxycontin, Morphine, Neurontin.   9.Acute on chronic anemia  Improved S/P 2 units PRBCs - follow Hg. And Transfuse if < 7.    10.  Acute Enterobacter infection - Noted on sputum culture 3/14 -Complete Cipro course for 5 days    All the records are reviewed and case discussed with Care Management/Social Worker. Management plans discussed with the patient, family and they are in agreement.  CODE STATUS: Full code  DVT Prophylaxis: Ted's & SCD's  TOTAL TIME TAKING CARE OF THIS PATIENT: 30 minutes.   POSSIBLE D/C IN 1-2 DAYS, DEPENDING ON CLINICAL CONDITION.   Houston Siren M.D on 11/14/2017 at 2:45 PM  Between 7am to 6pm - Pager - [phone]  After  6pm go to [email or web address] - Social research officer, government  [facility]  Office  [phone]  CC: Primary care physician; Patient, No Pcp Per

## 2017-11-14 NOTE — Evaluation (Signed)
Occupational Therapy Evaluation Patient Details Name: Joshua [name] MRN: [id number] DOB: 1991/01/22 Today's Date: 11/14/2017    History of Present Illness Joshua [name] is a 26yo white male, PMH: asthma, found unresponsive on floor of hotel room adjacent white powder rolled-up [place] currency. In ED pt found to be in V-fib arrest, UDS (+) for opiates, cocaine, THC. Family attests to history of drug abuse, alcohol abuse, denies IV drug use. 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 Pt with significan tanemia past 2 days limiting ability of OT to evaluate until today. Also followed by psych d/t SI, which patient denies.     Clinical Impression   Pt present at OT eval - in bed supine- cousin visiting. Report he just worked with PT - did okay and willing to work with OT. Report that his pain 10/10 in R LE( stump) , and L lower back. Bilateral UE AROM and strength WNL. Do at times show decrease response time. Upon attempt to sit at EOB - pt complain of severe pain in scrotum - and report that it hurts so bad moving and getting up earlier on walker. Upon further eval - pt bandage off on incision with extreme postop edema in R leg(stump) and scrotum. Did not finish ADL and moblity part of OT eval. But consulted with Lead PT about compression options for stump and scrotum. See below what intervention was done. Pt and cousin verbalize understanding about precautions when to take off compression but also what to expect. Also notify nsg what compression was done. Will finish OT ADL' s and Mobility part next time.    Follow Up Recommendations    STR    Equipment Recommendations       Recommendations for Other Services       Precautions / Restrictions Precautions Precautions: Fall Precaution Comments: suicide precautions, AKA precautions.       Mobility Bed Mobility Overal bed mobility: Needs Assistance Bed Mobility: Supine to Sit     Supine to  sit: min A for R LE ( stump)      General bed mobility comments: moderate additional time/effort required. Pain in stump, scrotum and L lower back   Transfers    To be assess next time      G   Balance                                         ADL either performed or assessed with clinical judgement   ADL  To be assess next time                                              Vision         Perception     Praxis      Pertinent Vitals/Pain Pain Assessment: 0-10 Pain Score: 10-Worst pain ever Pain Location: R stump, scrotum, and L lower back  Pain Descriptors / Indicators: Aching;Squeezing;Tender;Tightness Pain Intervention(s): (Did not completer OT eval for balance and ADL's - did intervention for edema and pain )     Hand Dominance     Extremity/Trunk Assessment Upper Extremity Assessment Upper Extremity Assessment: (Bilateral UE AROM and strength WFL - but L  Lower back and side  pain )    RLE Deficits / Details:  no bandage on incision - significant postoperatwe edema - contacted  Lead PT - to help intervene for bandaging of  stump and compression for scrotum LLE Deficits / Details: pedal edema       Communication Communication Communication: No difficulties   Cognition Arousal/Alertness: Awake/alert Behavior During Therapy: WFL for tasks assessed/performed(but slower response time - cousin helped repeat instructions ) Overall Cognitive Status: Within Functional Limits for tasks assessed                                     General Comments       Exercises   Wrap R stump with non adhesive dressing over incision - and then  Wide gauze wrap around stump in figure 8 , repeat 2 wide acewraps in figure 8 aournd R LE Stump to decrease edema  and pain  - done with lead PT    Provided  compression on scrotum -  cut top 1/3  of ted hose and donn over scrotum - with knot on in bottom and elevated on rolled up towel  -  done with lead PT   Check on pt after 45 min -and report its still feels okay - no increase pain  Cousin and another family member in room with pt   Shoulder Instructions      Home Living F            Home Equipment: None          Prior Functioning/Environment Level of Independence: Independent                 OT Problem List:  increase edema, pain , Decrease functional mobility , ADL's , act tolerance, balance      OT Treatment/Interventions:   ADL's retrng, manual therapy , there ex, functional moblity , neuromuscular reed    OT Goals(Current goals can be found in the care plan section) Acute Rehab OT Goals Patient Stated Goal: Want to be able to walk again - and get the pain better  Time For Goal Achievement: 11/28/17  OT Frequency:  5 x wk   GOALS:   1. Pt's  edema in R LE /stump and scrotum decrease for pt's pain to decrease less than 5/10 to participate for 10 min sitting at EOB in ADL's or functional task.  5 days 2; Assess pt's UB and LB ADL's  Days 2  3: Pt to be min A for toilet transfer using walker >  1 week    Barriers to D/C:            Co-evaluation              AM-PAC PT "6 Clicks" Daily Activity     Outcome Measure                 End of Session    Activity Tolerance:   Patient left:  with family                    Time: 1030-1120 OT Time Calculation (min): 50 min Charges:  OT Evaluation $OT Eval Moderate Complexity: 1 Mod OT Treatments $Self Care/Home Management : 8-22 mins G-Codes:     {Joshua [name] OTR/L,CLT  11/14/2017, 1:04 PM

## 2017-11-14 NOTE — Progress Notes (Signed)
[facility]  ROUNDING NOTE   Subjective:    Underwent amputation revision 3/19 Able to eat without nausea or vomiting Reports that he has started to void more often    Objective:  Vital signs in last 24 hours:  Temp:  [99 F (37.2 C)-100.3 F (37.9 C)] 99 F (37.2 C) (03/23 0512) Pulse Rate:  [91-111] 92 (03/23 0512) Resp:  [7-20] 16 (03/23 0512) BP: (128-169)/(74-97) 128/85 (03/23 0512) SpO2:  [91 %-96 %] 96 % (03/23 0512) Weight:  [174 lb (78.9 kg)-174 lb 13.2 oz (79.3 kg)] 174 lb (78.9 kg) (03/22 1451)  Weight change:  Filed Weights   11/11/17 1530 11/13/17 1045 11/13/17 1451  Weight: 171 lb 11.8 oz (77.9 kg) 174 lb 13.2 oz (79.3 kg) 174 lb (78.9 kg)    Intake/Output: I/O last 3 completed shifts: In: 120 [P.O.:120] Out: 2000 [Other:2000]   Intake/Output this shift:  No intake/output data recorded.  Physical Exam: General: NAD  Head:  [place]/AT  Eyes:  Anicteric  Neck:  Right IJ dialysis catheter,    Lungs:   clear to auscultation  Heart:  Regular, tachycardic  Abdomen:   Soft, nontender, Scrotal Edema  Extremities:  Rt AKA, with Rt thigh edema, left leg edema  Neurologic:  Alert, able to follow commands     Access: RIJ temp HD catheter 3/8 Dr. Lonn Georgia    Basic Metabolic Panel: Recent Labs  Lab 11/10/17 1610 11/11/17 0703 11/12/17 0329 11/13/17 0334 11/14/17 0721  NA 138 135 135 132* 136  K 3.6 4.5 4.2 4.8 4.7  CL 100* 99* 101 98* 101  CO2 27 25 28 26 27   GLUCOSE 81 114* 94 90 88  BUN 40* 56* 32* 49* 30*  CREATININE 4.69* 6.35* 4.52* 6.50* 5.24*  CALCIUM 8.1* 7.8* 7.6* 7.6* 7.7*  MG  --  2.1 2.0  --   --   PHOS  --  5.2*  --   --   --     Liver Function Tests: Recent Labs  Lab 11/09/17 0444  AST 308*  ALT 300*  ALKPHOS 50  BILITOT 0.9  PROT 5.0*  ALBUMIN 2.2*   No results for input(s): LIPASE, AMYLASE in the last 168 hours. No results for input(s): AMMONIA in the last 168 hours.  CBC: Recent Labs  Lab 11/08/17 0404  11/09/17 0444 11/10/17 9604 11/11/17 0703 11/12/17 0329 11/12/17 1631 11/13/17 0334 11/13/17 1923 11/14/17 0721  WBC 24.3* 30.0* 27.9* 30.1* 16.3*  --  17.0*  --  18.7*  NEUTROABS 19.2* 25.8*  --   --   --   --   --   --   --   HGB 7.4* 7.0* 7.4* 6.7* 5.7* 7.8* 7.1* 7.3* 7.3*  HCT 22.7* 20.6* 22.5* 19.6* 16.6* 23.3* 21.2* 22.0* 21.9*  MCV 92.9 90.2 91.2 86.9 87.1  --  88.1  --  88.7  PLT 225 268 371 407 389  --  411  --  441*    Cardiac Enzymes: Recent Labs  Lab 11/08/17 0404  CKTOTAL 7,699*    BNP: Invalid input(s): POCBNP  CBG: Recent Labs  Lab 11/08/17 1543 11/08/17 2016 11/08/17 2310 11/09/17 0400 11/09/17 0823  GLUCAP 95 101* 101* 89 85    Microbiology: Results for orders placed or performed during the hospital encounter of 10/30/17  MRSA PCR Screening     Status: None   Collection Time: 10/30/17  7:54 PM  Result Value Ref Range Status   MRSA by PCR NEGATIVE NEGATIVE Final  Comment:        The GeneXpert MRSA Assay (FDA approved for NASAL specimens only), is one component of a comprehensive MRSA colonization surveillance program. It is not intended to diagnose MRSA infection nor to guide or monitor treatment for MRSA infections. Performed at [facility], [address] [place], [place] [address]   Culture, respiratory (NON-Expectorated)     Status: None   Collection Time: 11/05/17  8:17 AM  Result Value Ref Range Status   Specimen Description   Final    TRACHEAL ASPIRATE Performed at [facility], [address], [place], [place] [address]    Special Requests   Final    NONE Performed at [facility], [address] [place], [place] [address]    Gram Stain   Final    FEW WBC PRESENT, PREDOMINANTLY PMN MODERATE GRAM NEGATIVE RODS RARE YEAST Performed at [facility], [address] [place], [place] [address]    Culture MODERATE ENTEROBACTER CLOACAE  Final   Report Status 11/08/2017 FINAL  Final    Organism ID, Bacteria ENTEROBACTER CLOACAE  Final      Susceptibility   Enterobacter cloacae - MIC*    CEFAZOLIN >=64 RESISTANT Resistant     CEFEPIME <=1 SENSITIVE Sensitive     CEFTAZIDIME <=1 SENSITIVE Sensitive     CEFTRIAXONE <=1 SENSITIVE Sensitive     CIPROFLOXACIN <=0.25 SENSITIVE Sensitive     GENTAMICIN <=1 SENSITIVE Sensitive     IMIPENEM <=0.25 SENSITIVE Sensitive     TRIMETH/SULFA <=20 SENSITIVE Sensitive     PIP/TAZO <=4 SENSITIVE Sensitive     * MODERATE ENTEROBACTER CLOACAE  Culture, blood (routine x 2)     Status: None   Collection Time: 11/09/17  8:49 PM  Result Value Ref Range Status   Specimen Description BLOOD DIALYSIS  Final   Special Requests   Final    BOTTLES DRAWN AEROBIC AND ANAEROBIC Blood Culture adequate volume   Culture   Final    NO GROWTH 5 DAYS Performed at [facility], [address] [place], [place] [address]    Report Status 11/14/2017 FINAL  Final    Coagulation Studies: No results for input(s): LABPROT, INR in the last 72 hours.  Urinalysis: Recent Labs    11/12/17 2030  COLORURINE AMBER*  LABSPEC 1.019  PHURINE 6.0  GLUCOSEU 50*  HGBUR LARGE*  BILIRUBINUR NEGATIVE  KETONESUR NEGATIVE  PROTEINUR 100*  NITRITE NEGATIVE  LEUKOCYTESUR TRACE*      Imaging: Mr Thoracic Spine Wo Contrast  Result Date: 11/12/2017 CLINICAL DATA:  Back pain. 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 EXAM: MRI THORACIC AND LUMBAR SPINE WITHOUT CONTRAST TECHNIQUE: Multiplanar and multiecho pulse sequences of the thoracic and lumbar spine were obtained without intravenous contrast. COMPARISON:  CT abdomen pelvis dated September 29, 2016. FINDINGS: MRI THORACIC SPINE FINDINGS Alignment:  Physiologic. Vertebrae: No fracture, evidence of discitis, or bone lesion. Cord:  Normal signal and morphology. Paraspinal and other soft tissues: Small  bilateral pleural effusions. Disc levels: Normal disc spaces. No spinal canal or neuroforaminal stenosis at any level. MRI LUMBAR SPINE FINDINGS Segmentation:  Standard. Alignment:  Physiologic. Vertebrae:  No fracture, evidence of discitis, or bone lesion. Conus medullaris and cauda equina: Conus extends to the L1 level. Conus and cauda equina appear normal. Paraspinal and other soft tissues: Prominent edema within the bilateral paraspinous muscles without focal fluid  collection. There are small areas of intramuscular hemorrhage within the left paraspinous muscles. Prominent subcutaneous edema throughout the lower back. Nonspecific presacral edema. Disc levels: T12-L1 to L3-L4:  Negative. L4-L5: Small central disc protrusion. Mild left lateral recess and neuroforaminal stenosis. No spinal canal or right neuroforaminal stenosis. L5-S1: Tiny central disc protrusion and annular fissure. No stenosis. IMPRESSION: THORACIC SPINE: 1. No acute abnormality.  No significant degenerative changes. 2. Small bilateral pleural effusions. LUMBAR SPINE: 1. Prominent edema within the bilateral paraspinous muscles likely reflects rhabdomyolysis given clinical history. Small areas of intramuscular hemorrhage within the left paraspinous muscles. No focal fluid collection. 2. Mild left lateral recess and neuroforaminal stenosis at L4-L5 due to small disc protrusion. No high-grade spinal canal or neuroforaminal stenosis in the lumbar spine. 3. Annular fissure at L5-S1. Electronically Signed   By: Obie DredgeWilliam T Derry M.D.   On: 11/12/2017 20:23   Mr Lumbar Spine Wo Contrast  Result Date: 11/12/2017 CLINICAL DATA:  Back pain. 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 EXAM: MRI THORACIC AND LUMBAR SPINE WITHOUT CONTRAST TECHNIQUE: Multiplanar and multiecho pulse sequences of the thoracic and lumbar spine were obtained without  intravenous contrast. COMPARISON:  CT abdomen pelvis dated September 29, 2016. FINDINGS: MRI THORACIC SPINE FINDINGS Alignment:  Physiologic. Vertebrae: No fracture, evidence of discitis, or bone lesion. Cord:  Normal signal and morphology. Paraspinal and other soft tissues: Small bilateral pleural effusions. Disc levels: Normal disc spaces. No spinal canal or neuroforaminal stenosis at any level. MRI LUMBAR SPINE FINDINGS Segmentation:  Standard. Alignment:  Physiologic. Vertebrae:  No fracture, evidence of discitis, or bone lesion. Conus medullaris and cauda equina: Conus extends to the L1 level. Conus and cauda equina appear normal. Paraspinal and other soft tissues: Prominent edema within the bilateral paraspinous muscles without focal fluid collection. There are small areas of intramuscular hemorrhage within the left paraspinous muscles. Prominent subcutaneous edema throughout the lower back. Nonspecific presacral edema. Disc levels: T12-L1 to L3-L4:  Negative. L4-L5: Small central disc protrusion. Mild left lateral recess and neuroforaminal stenosis. No spinal canal or right neuroforaminal stenosis. L5-S1: Tiny central disc protrusion and annular fissure. No stenosis. IMPRESSION: THORACIC SPINE: 1. No acute abnormality.  No significant degenerative changes. 2. Small bilateral pleural effusions. LUMBAR SPINE: 1. Prominent edema within the bilateral paraspinous muscles likely reflects rhabdomyolysis given clinical history. Small areas of intramuscular hemorrhage within the left paraspinous muscles. No focal fluid collection. 2. Mild left lateral recess and neuroforaminal stenosis at L4-L5 due to small disc protrusion. No high-grade spinal canal or neuroforaminal stenosis in the lumbar spine. 3. Annular fissure at L5-S1. Electronically Signed   By: Obie DredgeWilliam T Derry M.D.   On: 11/12/2017 20:23     Medications:    . ciprofloxacin  500 mg Oral q1800  . feeding supplement (ENSURE ENLIVE)  237 mL Oral TID BM  .  ferrous sulfate  325 mg Oral BID WC  . gabapentin  100 mg Oral TID  . multivitamin with minerals  1 tablet Oral Daily  . nicotine  21 mg Transdermal Daily  . oxyCODONE  80 mg Oral Q12H  . QUEtiapine  25 mg Oral QHS  . senna  2 tablet Oral QHS  . sodium chloride flush  10-40 mL Intracatheter Q12H  . vitamin C  250 mg Oral BID   acetaminophen, ALPRAZolam, bisacodyl, dextrose, morphine injection, nystatin cream, sennosides  Assessment/ Plan:  Joshua [name] is  a 27 y.o. white male with asthma, bronchitis, polysubstance abuse, was admitted on3/8/2019after he was found down in a hotel for an unknown period of time in the fetal position. Hospital course includes prolonged CPR for 45 minutes in the emergency room. Ventricular fibrillation arrest, hypothermia protocol.  Urine tox screen positive for cocaine and opiates.  Hospital course complicated by acute respiratory failure and acute renal failure. Started on CRRT 3/8. Underwent disarticulation of right lower extremity due to ischemic limb. Rhabdomyolysis with sustained CPK greater than 50,000 for several days.Self extubated on 3/15. Transitioned to intermittent hemodialysis on 3/16.   1.  Acute renal failure:  Acute renal failure secondary to ATN, shock, hypotension and rhabdomyolysis.  still anuric.  Requiring renal replacement therapy.  Monitor for renal recovery. Continue to monitor volume status, urine output, renal function and electrolytes.  Renally dose all medications.  Will try iv Lasix over the weekend to see if we can convert to non-oliguric state  2.  Rhabdomyolysis: cocaine induced rhabdomyolysis. CPK trending down  3.  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 Gilda Crease on 3/12.   Underwent revision AKA 3/19 May increase dose of gabapentin over time slowly.  4. Scrotal edema Conservative treatment Urology evaluation 3/21 Dr Sherryl Barters  5. LE edema - Now has developed Rt leg edema;   Trial  of lasix      LOS: 15 Chanel Mckesson 3/23/201910:34 AM

## 2017-11-15 NOTE — Progress Notes (Signed)
[employer] [facility] [place] [facility]   PATIENT NAME: Joshua [name]    MR#:  [id number]  DATE OF BIRTH:  Nov 14, 1990  SUBJECTIVE:   Patient had a fall overnight. No trauma skin tears or lacerations. Patient going for PermCath placement tomorrow. Did not respond to IV Lasix yesterday.  REVIEW OF SYSTEMS:    Review of Systems  Constitutional: Negative for chills and fever.  HENT: Negative for congestion and tinnitus.   Eyes: Negative for blurred vision and double vision.  Respiratory: Negative for cough, shortness of breath and wheezing.   Cardiovascular: Negative for chest pain, orthopnea and PND.  Gastrointestinal: Negative for abdominal pain, diarrhea, nausea and vomiting.  Genitourinary: Negative for dysuria and hematuria.  Neurological: Negative for dizziness, sensory change and focal weakness.  All other systems reviewed and are negative.   Nutrition: Regular Tolerating Diet: yes Tolerating PT: Await Eval.    DRUG ALLERGIES:  No Known Allergies  VITALS:  Blood pressure (!) 148/87, pulse 85, temperature 98.1 F (36.7 C), temperature source Oral, resp. rate 12, height 5\' 10"  (1.778 m), weight 78.9 kg (174 lb), SpO2 93 %.  PHYSICAL EXAMINATION:   Physical Exam  GENERAL:  27 y.o.-year-old patient lying in bed in no acute distress.  EYES: Pupils equal, round, reactive to light and accommodation. No scleral icterus. Extraocular muscles intact.  HEENT: Head atraumatic, normocephalic. Oropharynx and nasopharynx clear.  NECK:  Supple, no jugular venous distention. No thyroid enlargement, no tenderness.  LUNGS: Normal breath sounds bilaterally, no wheezing, rales, rhonchi. No use of accessory muscles of respiration.  CARDIOVASCULAR: S1, S2 normal. No murmurs, rubs, or gallops.  ABDOMEN: Soft, nontender, nondistended. Bowel sounds present. No organomegaly or mass.  EXTREMITIES: No cyanosis, clubbing or edema b/l.   S/p Right AKA NEUROLOGIC: Cranial nerves II through  XII are intact. No focal Motor or sensory deficits b/l.  Globally weak.  PSYCHIATRIC: The patient is alert and oriented x 3.  SKIN: No obvious rash, lesion, or ulcer.  GU - + Scrotal Edema.   LABORATORY PANEL:   CBC Recent Labs  Lab 11/14/17 0721 11/14/17 1327  WBC 18.7*  --   HGB 7.3* 7.4*  HCT 21.9* 22.1*  PLT 441*  --    ------------------------------------------------------------------------------------------------------------------  Chemistries  Recent Labs  Lab 11/09/17 0444  11/12/17 0329  11/14/17 0721  NA 138   < > 135   < > 136  K 4.0   < > 4.2   < > 4.7  CL 104   < > 101   < > 101  CO2 23   < > 28   < > 27  GLUCOSE 107*   < > 94   < > 88  BUN 51*   < > 32*   < > 30*  CREATININE 5.33*   < > 4.52*   < > 5.24*  CALCIUM 8.5*   < > 7.6*   < > 7.7*  MG  --    < > 2.0  --   --   AST 308*  --   --   --   --   ALT 300*  --   --   --   --   ALKPHOS 50  --   --   --   --   BILITOT 0.9  --   --   --   --    < > = values in this interval not displayed.   ------------------------------------------------------------------------------------------------------------------  Cardiac Enzymes No  results for input(s): TROPONINI in the last 168 hours. ------------------------------------------------------------------------------------------------------------------  RADIOLOGY:  No results found.   ASSESSMENT AND PLAN:   27 year old male with known history of asthma, tobacco abuse, IVDA presents to hospital secondary to an unresponsive state.  1. Acutecardiac arrest - Most likely secondary to polysubstance overdose - resolved and pt. Presently stable.   2. Acuteend-stage renal disease  Secondary to cocaine induced rhabdomyolysis  -status post temporary dialysis catheter placement yesterday. Plan for Perm-cath tomorrow.   Cont. Dialysis as per Nephrology.  - pt. Needs and outpatient spot for HD. Did not respond to lasix and not making much urine yesterday.   3.  Rhabdomyolysis, acute - Resolved, Secondary topossible ischemic right leg with compartment syndrome. [facility] revision with AKA on November 11, 2017, patient recovering well  4. Sepsis, acute Resolved Treated with course of IV Unasyn/vancomycin  5. DIC, acute - resolved. No acute issue.   6. 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  Compounded by polysubstance abuse - sitter discontinued.    8.Acute on chronic pain syndrome Compounded by right AKA - cont. Oxycontin, Morphine, Neurontin.   9.Acute on chronic anemia  Improved S/P 2 units PRBCs - follow Hg. And Transfuse if < 7.    10.  Acute Enterobacter infection - Noted on sputum culture 3/14 -Complete Cipro course for 5 days    All the records are reviewed and case discussed with Care Management/Social Worker. Management plans discussed with the patient, family and they are in agreement.  CODE STATUS: Full code  DVT Prophylaxis: Ted's & SCD's  TOTAL TIME TAKING CARE OF THIS PATIENT: 25 minutes.   POSSIBLE D/C IN 1-2 DAYS, DEPENDING ON CLINICAL CONDITION.   Houston SirenSAINANI,VIVEK J M.D on 11/15/2017 at 2:37 PM  Between 7am to 6pm - Pager - [phone]  After 6pm go to [email or web address] - Social research officer, governmentpassword [email or web address]  [employer] [place] [facility]  Office  [phone]  CC: Primary care physician; Patient, No Pcp Per

## 2017-11-15 NOTE — Progress Notes (Signed)
2 Days Post-Op   Subjective/Chief Complaint: Doing OK. Noted events overnight. Without complaint. Notes right AKA stump pain- controlled with current regimen.  Objective: Vital signs in last 24 hours: Temp:  [98.8 F (37.1 C)-100.6 F (38.1 C)] 98.8 F (37.1 C) (03/24 0950) Pulse Rate:  [97-114] 97 (03/24 0950) Resp:  [11-19] 11 (03/24 0950) BP: (132-156)/(59-89) 146/77 (03/24 0950) SpO2:  [92 %-96 %] 96 % (03/24 0950) Last BM Date: 11/08/17  Intake/Output from previous day: 03/23 0701 - 03/24 0700 In: 10 [I.V.:10] Out: -  Intake/Output this shift: Total I/O In: 240 [P.O.:240] Out: -   General appearance: alert and no distress Extremities: Right AKA stump- edema, mild erythema, incision- C/D/I  Lab Results:  Recent Labs    11/13/17 0334  11/14/17 0721 11/14/17 1327  WBC 17.0*  --  18.7*  --   HGB 7.1*   < > 7.3* 7.4*  HCT 21.2*   < > 21.9* 22.1*  PLT 411  --  441*  --    < > = values in this interval not displayed.   BMET Recent Labs    11/13/17 0334 11/14/17 0721  NA 132* 136  K 4.8 4.7  CL 98* 101  CO2 26 27  GLUCOSE 90 88  BUN 49* 30*  CREATININE 6.50* 5.24*  CALCIUM 7.6* 7.7*   PT/INR No results for input(s): LABPROT, INR in the last 72 hours. ABG No results for input(s): PHART, HCO3 in the last 72 hours.  Invalid input(s): PCO2, PO2  Studies/Results: No results found.  Anti-infectives: Anti-infectives (From admission, onward)   Start     Dose/Rate Route Frequency Ordered Stop   11/13/17 0500  ceFAZolin (ANCEF) IVPB 1 g/50 mL premix    Note to Pharmacy:  Send with pt to OR   1 g 100 mL/hr over 30 Minutes Intravenous On call 11/12/17 2012 11/13/17 0639   11/12/17 1500  ciprofloxacin (CIPRO) tablet 500 mg     500 mg Oral Daily-1800 11/12/17 1413 11/17/17 1759   11/10/17 0000  ceFAZolin (ANCEF) IVPB 2g/100 mL premix    Note to Pharmacy:  Send with pt to OR   2 g 200 mL/hr over 30 Minutes Intravenous On call 11/09/17 1831 11/10/17 1653    10/31/17 1600  vancomycin (VANCOCIN) 1,250 mg in sodium chloride 0.9 % 250 mL IVPB  Status:  Discontinued     1,250 mg 166.7 mL/hr over 90 Minutes Intravenous Every 24 hours 10/30/17 1909 10/31/17 1528   10/31/17 1600  vancomycin (VANCOCIN) IVPB 1000 mg/200 mL premix  Status:  Discontinued     1,000 mg 200 mL/hr over 60 Minutes Intravenous Every 24 hours 10/31/17 1528 11/01/17 1106   10/30/17 1800  Ampicillin-Sulbactam (UNASYN) 3 g in sodium chloride 0.9 % 100 mL IVPB  Status:  Discontinued     3 g 200 mL/hr over 30 Minutes Intravenous Every 6 hours 10/30/17 1750 11/06/17 1007   10/30/17 1800  vancomycin (VANCOCIN) IVPB 1000 mg/200 mL premix     1,000 mg 200 mL/hr over 60 Minutes Intravenous  Once 10/30/17 1750 10/30/17 2005   10/30/17 1315  piperacillin-tazobactam (ZOSYN) IVPB 3.375 g     3.375 g 100 mL/hr over 30 Minutes Intravenous  Once 10/30/17 1314 10/30/17 1348      Assessment/Plan: s/p Procedure(s): DIALYSIS/PERMA CATHETER INSERTION (N/A)  27 year old man status post5Day Post-Op:Right above-the-knee amputationandPOD#12:Right knee disarticulation with amputation- stable   Plan for permacath tomorrow. for HD Monitor stump- continue daily dressing changes Continue PT  LOS: 16 days    Eli Hosesco, Brayli Klingbeil A 11/15/2017

## 2017-11-15 NOTE — Progress Notes (Signed)
Physical Therapy Treatment Patient Details Name: Joshua HubertMark A Fedak MRN: [id number] DOB: 02/09/1991 Today's Date: 11/15/2017    History of Present Illness presented to ER after being found unresponsive, down for unknown period of time, related to ingestion of controlled substances.  Patient suffered vfib arrest en route and underwent 45 minutes CPR; subsequently transitioned to hypothermia cooling protocol.  Remained intubated and sedated until self-extubation 3/15. UDS positive for cocaine and opiates.  Hospital course additionally significant for severe rhabomyolisis with ischemic R LE (status post emergent knee disarticulation 3/12 with revision to R AKA 3/15); initiation of CRRT due to renal failure, now transitioned to temporary dialysis via R IJ, scheduled for conversion to permcath with HD next date.    Of note, per telephone clarification with Dr. Thedore MinsSingh (nephrology), patient okay for OOB and ambulation as tolerated with temp IJ catheter.   PT Comments    Patient requiring increased assist for all functional activities this date compared to initial evaluation.  Significant balance deficits evident; significant limitations in insight/safety awareness.  Very high fall risk as result.  Patient very motivated, eager to participate, but demonstrates marked limitations in functional endurance given extent of hospitalization/medical situation.  Do anticipate consistent improvement with access to appropriate rehab services. Patient has good family support and plans to discharge home with sister (who can provide 24 hour sup/assist) when functionally ready. Unable to consistently demonstrate ability to negotiate transfers, household distances or stairs required to safely navigate home environment at this time. Significant opportunity for mobility training, limb wrapping/care exist; would benefit from intensive, post-acute rehab services upon discharge to provide appropriate levels of education/training and  facilitate return to optimal level of functional ability.   Follow Up Recommendations  CIR     Equipment Recommendations       Recommendations for Other Services       Precautions / Restrictions Precautions Precautions: Fall Precaution Comments: pressure area to L heel, R IJ temp dialysis catheter Restrictions Weight Bearing Restrictions: Yes RLE Weight Bearing: Non weight bearing    Mobility  Bed Mobility Overal bed mobility: Needs Assistance Bed Mobility: Supine to Sit     Supine to sit: Supervision        Transfers Overall transfer level: Needs assistance Equipment used: Rolling walker (2 wheeled) Transfers: Sit to/from Stand Sit to Stand: Min assist         General transfer comment: cuing for hand placement, increased time required  Ambulation/Gait Ambulation/Gait assistance: Min assist;Mod assist Ambulation Distance (Feet): 15 Feet Assistive device: Rolling walker (2 wheeled)       General Gait Details: hop to gait pattern with limited L LE clearance; tends to take small, stuttering hops.  Poor balance, poor functional endurance for gait activities.   Stairs            Wheelchair Mobility    Modified Rankin (Stroke Patients Only)       Balance Overall balance assessment: Needs assistance Sitting-balance support: No upper extremity supported;Feet supported Sitting balance-Leahy Scale: Good     Standing balance support: Bilateral upper extremity supported Standing balance-Leahy Scale: Fair Standing balance comment: requires unilateral UE support at all times to maintain balance; difficulty maintaining full postural extension with divided attention, fatigue (tends to drift towards hip/knee flexion; increased risk for buckling)                            Cognition Arousal/Alertness: Awake/alert Behavior During Therapy: WFL for tasks assessed/performed;Impulsive  Overall Cognitive Status: Impaired/Different from baseline                                  General Comments: oriented to self, location and general situation; limited recall of new information, poor insight/safety awareness      Exercises Other Exercises Other Exercises: Sit/stand with RW, min assist-emphasis on hand placement, overall mechanics.  Good position and control of L LE; limited carry-over of cuing for hand placement Other Exercises: Standing balance during grooming tasks at sink, min/mod assist with RW--requires unilateral UE support at all times to maintain balance; difficulty maintaining full postural extension with divided attention, fatigue (tends to drift towards hip/knee flexion; increased risk for buckling).  Patient generally impulsive and unaware of safety needs, functional deficits Other Exercises: Initiated education regarding desensitization techniques to R residual limb, positioning (encouraged flat positioning, full extension in bed periodically); patient voiced understanding.   Other Exercises: Answered questions regarding mobility progression, recovery process, process for obtaining/learning prosthesis; patient motivated, eager for recovery as able.    General Comments        Pertinent Vitals/Pain Pain Assessment: Faces Faces Pain Scale: Hurts even more Pain Location: R residual limb, scrotum Pain Descriptors / Indicators: Aching;Grimacing Pain Intervention(s): Limited activity within patient's tolerance;Monitored during session;Repositioned;RN gave pain meds during session    Home Living                      Prior Function            PT Goals (current goals can now be found in the care plan section)      Frequency    7X/week      PT Plan Discharge plan needs to be updated    Co-evaluation              AM-PAC PT "6 Clicks" Daily Activity  Outcome Measure  Difficulty turning over in bed (including adjusting bedclothes, sheets and blankets)?: A Little Difficulty moving from  lying on back to sitting on the side of the bed? : A Little Difficulty sitting down on and standing up from a chair with arms (e.g., wheelchair, bedside commode, etc,.)?: A Little Help needed moving to and from a bed to chair (including a wheelchair)?: A Lot Help needed walking in hospital room?: A Lot Help needed climbing 3-5 steps with a railing? : Total 6 Click Score: 14    End of Session Equipment Utilized During Treatment: Gait belt Activity Tolerance: Patient tolerated treatment well Patient left: in chair;with chair alarm set;with call bell/phone within reach Nurse Communication: Mobility status PT Visit Diagnosis: Difficulty in walking, not elsewhere classified (R26.2);Muscle weakness (generalized) (M62.81)     Time: 6962-9528 PT Time Calculation (min) (ACUTE ONLY): 46 min  Charges:  $Gait Training: 8-22 mins $Therapeutic Activity: 23-37 mins                    G Codes:      Quinlee Sciarra H. Manson Passey, PT, DPT, NCS 11/15/17, 4:44 PM [phone]

## 2017-11-15 NOTE — Progress Notes (Signed)
[facility]  ROUNDING NOTE   Subjective:   Underwent amputation revision 3/19 2000 cc removed with hemodialysis on Friday No significant response to IV Lasix He denies any nausea or vomiting  Objective:  Vital signs in last 24 hours:  Temp:  [98.1 F (36.7 C)-100.6 F (38.1 C)] 98.1 F (36.7 C) (03/24 1316) Pulse Rate:  [85-114] 85 (03/24 1316) Resp:  [11-18] 12 (03/24 1316) BP: (132-156)/(59-89) 148/87 (03/24 1316) SpO2:  [92 %-96 %] 93 % (03/24 1316)  Weight change:  Filed Weights   11/11/17 1530 11/13/17 1045 11/13/17 1451  Weight: 171 lb 11.8 oz (77.9 kg) 174 lb 13.2 oz (79.3 kg) 174 lb (78.9 kg)    Intake/Output: I/O last 3 completed shifts: In: 130 [P.O.:120; I.V.:10] Out: -    Intake/Output this shift:  Total I/O In: 240 [P.O.:240] Out: -   Physical Exam: General: NAD, laying in the bed  Head:  Joshua [name]  Eyes:  Anicteric  Neck:  Right IJ dialysis catheter,    Lungs:   clear to auscultation  Heart:  Regular, tachycardic  Abdomen:   Soft, nontender, Scrotal Edema  Extremities:  Rt AKA, with Rt thigh edema, left leg edema  Neurologic:  Alert, able to follow commands     Access: RIJ temp HD catheter 3/8 Dr. Lonn [name]    Basic Metabolic Panel: Recent Labs  Lab 11/10/17 [id number] 11/11/17 0703 11/12/17 0329 11/13/17 0334 11/14/17 0721  NA 138 135 135 132* 136  K 3.6 4.5 4.2 4.8 4.7  CL 100* 99* 101 98* 101  CO2 27 25 28 26 27   GLUCOSE 81 114* 94 90 88  BUN 40* 56* 32* 49* 30*  CREATININE 4.69* 6.35* 4.52* 6.50* 5.24*  CALCIUM 8.1* 7.8* 7.6* 7.6* 7.7*  MG  --  2.1 2.0  --   --   PHOS  --  5.2*  --   --   --     Liver Function Tests: Recent Labs  Lab 11/09/17 0444  AST 308*  ALT 300*  ALKPHOS 50  BILITOT 0.9  PROT 5.0*  ALBUMIN 2.2*   No results for input(s): LIPASE, AMYLASE in the last 168 hours. No results for input(s): AMMONIA in the last 168 hours.  CBC: Recent Labs  Lab 11/09/17 0444 11/10/17 0918 11/11/17 0703  11/12/17 0329 11/12/17 1631 11/13/17 0334 11/13/17 1923 11/14/17 0721 11/14/17 1327  WBC 30.0* 27.9* 30.1* 16.3*  --  17.0*  --  18.7*  --   NEUTROABS 25.8*  --   --   --   --   --   --   --   --   HGB 7.0* 7.4* 6.7* 5.7* 7.8* 7.1* 7.3* 7.3* 7.4*  HCT 20.6* 22.5* 19.6* 16.6* 23.3* 21.2* 22.0* 21.9* 22.1*  MCV 90.2 91.2 86.9 87.1  --  88.1  --  88.7  --   PLT 268 371 407 389  --  411  --  441*  --     Cardiac Enzymes: No results for input(s): CKTOTAL, CKMB, CKMBINDEX, TROPONINI in the last 168 hours.  BNP: Invalid input(s): POCBNP  CBG: Recent Labs  Lab 11/08/17 1543 11/08/17 2016 11/08/17 2310 11/09/17 0400 11/09/17 0823  GLUCAP 95 101* 101* 89 85    Microbiology: Results for orders placed or performed during the hospital encounter of 10/30/17  MRSA PCR Screening     Status: None   Collection Time: 10/30/17  7:54 PM  Result Value Ref Range Status   MRSA by PCR NEGATIVE  NEGATIVE Final    Comment:        The GeneXpert MRSA Assay (FDA approved for NASAL specimens only), is one component of a comprehensive MRSA colonization surveillance program. It is not intended to diagnose MRSA infection nor to guide or monitor treatment for MRSA infections. Performed at [facility], [address] [place], [place] [address]   Culture, respiratory (NON-Expectorated)     Status: None   Collection Time: 11/05/17  8:17 AM  Result Value Ref Range Status   Specimen Description   Final    TRACHEAL ASPIRATE Performed at [facility], [address], [place], [place] [address]    Special Requests   Final    NONE Performed at [facility], [address] [place], [place] [address]    Gram Stain   Final    FEW WBC PRESENT, PREDOMINANTLY PMN MODERATE GRAM NEGATIVE RODS RARE YEAST Performed at [facility], [address] [place], [place] [address]    Culture MODERATE ENTEROBACTER CLOACAE  Final   Report Status 11/08/2017 FINAL  Final    Organism ID, Bacteria ENTEROBACTER CLOACAE  Final      Susceptibility   Enterobacter cloacae - MIC*    CEFAZOLIN >=64 RESISTANT Resistant     CEFEPIME <=1 SENSITIVE Sensitive     CEFTAZIDIME <=1 SENSITIVE Sensitive     CEFTRIAXONE <=1 SENSITIVE Sensitive     CIPROFLOXACIN <=0.25 SENSITIVE Sensitive     GENTAMICIN <=1 SENSITIVE Sensitive     IMIPENEM <=0.25 SENSITIVE Sensitive     TRIMETH/SULFA <=20 SENSITIVE Sensitive     PIP/TAZO <=4 SENSITIVE Sensitive     * MODERATE ENTEROBACTER CLOACAE  Culture, blood (routine x 2)     Status: None   Collection Time: 11/09/17  8:49 PM  Result Value Ref Range Status   Specimen Description BLOOD DIALYSIS  Final   Special Requests   Final    BOTTLES DRAWN AEROBIC AND ANAEROBIC Blood Culture adequate volume   Culture   Final    NO GROWTH 5 DAYS Performed at [facility], [address], [place], [place] [address]    Report Status 11/14/2017 FINAL  Final    Coagulation Studies: No results for input(s): LABPROT, INR in the last 72 hours.  Urinalysis: Recent Labs    11/12/17 2030  COLORURINE AMBER*  LABSPEC 1.019  PHURINE 6.0  GLUCOSEU 50*  HGBUR LARGE*  BILIRUBINUR NEGATIVE  KETONESUR NEGATIVE  PROTEINUR 100*  NITRITE NEGATIVE  LEUKOCYTESUR TRACE*      Imaging: No results found.   Medications:    . ciprofloxacin  500 mg Oral q1800  . feeding supplement (ENSURE ENLIVE)  237 mL Oral TID BM  . ferrous sulfate  325 mg Oral BID WC  . gabapentin  100 mg Oral TID  . multivitamin with minerals  1 tablet Oral Daily  . nicotine  21 mg Transdermal Daily  . oxyCODONE  80 mg Oral Q12H  . QUEtiapine  25 mg Oral QHS  . senna  2 tablet Oral QHS  . sodium chloride flush  10-40 mL Intracatheter Q12H  . vitamin C  250 mg Oral BID   acetaminophen, ALPRAZolam, bisacodyl, dextrose, morphine injection, nystatin cream, sennosides  Assessment/ Plan:  [name] is a 27 y.o. white male with asthma, bronchitis, polysubstance  abuse, was admitted on3/8/2019after he was found down in a hotel for an unknown period of time in the fetal position. Hospital course includes prolonged CPR for  45 minutes in the emergency room. Ventricular fibrillation arrest, hypothermia protocol.  Urine tox screen positive for cocaine and opiates.  Hospital course complicated by acute respiratory failure and acute renal failure. Started on CRRT 3/8. Underwent disarticulation of right lower extremity due to ischemic limb. Rhabdomyolysis with sustained CPK greater than 50,000 for several days.Self extubated on 3/15. Transitioned to intermittent hemodialysis on 3/16.   1.  Acute renal failure:  Acute renal failure secondary to ATN, shock, hypotension and rhabdomyolysis.  Requiring renal replacement therapy. Urine output remains poor  Monitor for renal recovery. Continue to monitor volume status, urine output, renal function and electrolytes.  Renally dose all medications.   No significant response to IV Lasix Hemodialysis planned for Monday Outpatient dialysis planning will proceed once patient is declared ESRD With prolonged continued oligo anuria, he may be nearing ESRD  2. Rhabdomyolysis: cocaine induced rhabdomyolysis. CPK trending down  3.  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 Gilda Crease on 3/12.   Underwent revision AKA 3/19 May increase dose of gabapentin over time slowly.  4. Scrotal edema Conservative treatment Urology evaluation 3/21 Dr Sherryl Barters  5. LE edema - Now has developed Rt leg edema;   -No significant response to IV Lasix.  Will continue fluid removal with hemodialysis as tolerated    LOS: 16 Joshua [name] 3/24/20191:23 PM

## 2017-11-16 ENCOUNTER — Inpatient Hospital Stay: Payer: Self-pay

## 2017-11-16 ENCOUNTER — Encounter
Admission: EM | Disposition: A | Discharge status: Home Health | Payer: Self-pay | Source: Home / Self Care | Attending: Specialist

## 2017-11-16 DIAGNOSIS — N186 End stage renal disease: Secondary | ICD-10-CM

## 2017-11-16 DIAGNOSIS — T796XXS Traumatic ischemia of muscle, sequela: Secondary | ICD-10-CM

## 2017-11-16 HISTORY — PX: DIALYSIS/PERMA CATHETER INSERTION: CATH118288

## 2017-11-16 LAB — CBC
HCT: 22.3 % — ABNORMAL LOW (ref 40.0–52.0)
Hemoglobin: 7.5 g/dL — ABNORMAL LOW (ref 13.0–18.0)
MCH: 29.9 pg (ref 26.0–34.0)
MCHC: 33.5 g/dL (ref 32.0–36.0)
MCV: 89.2 fL (ref 80.0–100.0)
PLATELETS: 484 10*3/uL — AB (ref 150–440)
RBC: 2.49 MIL/uL — ABNORMAL LOW (ref 4.40–5.90)
RDW: 14 % (ref 11.5–14.5)
WBC: 13.2 10*3/uL — ABNORMAL HIGH (ref 3.8–10.6)

## 2017-11-16 SURGERY — DIALYSIS/PERMA CATHETER INSERTION
Anesthesia: Moderate Sedation

## 2017-11-16 MED ORDER — FENTANYL CITRATE (PF) 100 MCG/2ML IJ SOLN
INTRAMUSCULAR | Status: AC
  Filled 2017-11-16: qty 2

## 2017-11-16 MED ORDER — HEPARIN SODIUM (PORCINE) 10000 UNIT/ML IJ SOLN
INTRAMUSCULAR | Status: AC
  Filled 2017-11-16: qty 1

## 2017-11-16 MED ORDER — MIDAZOLAM HCL 2 MG/2ML IJ SOLN
INTRAMUSCULAR | Status: DC | PRN
  Administered 2017-11-16 (×2): 1 mg via INTRAVENOUS
  Administered 2017-11-16: 2 mg via INTRAVENOUS

## 2017-11-16 MED ORDER — LIDOCAINE-EPINEPHRINE (PF) 1 %-1:200000 IJ SOLN
INTRAMUSCULAR | Status: AC
  Filled 2017-11-16: qty 30

## 2017-11-16 MED ORDER — RENA-VITE PO TABS
1.0000 | ORAL_TABLET | Freq: Every day | ORAL | Status: DC
  Administered 2017-11-16 – 2017-11-26 (×12): 1 via ORAL
  Filled 2017-11-16 (×12): qty 1

## 2017-11-16 MED ORDER — FENTANYL CITRATE (PF) 100 MCG/2ML IJ SOLN
INTRAMUSCULAR | Status: DC | PRN
  Administered 2017-11-16 (×3): 50 ug via INTRAVENOUS

## 2017-11-16 MED ORDER — SODIUM CHLORIDE 0.9 % IV SOLN
INTRAVENOUS | Status: DC
  Administered 2017-11-16: 16:00:00 via INTRAVENOUS

## 2017-11-16 MED ORDER — MIDAZOLAM HCL 5 MG/5ML IJ SOLN
INTRAMUSCULAR | Status: AC
  Filled 2017-11-16: qty 5

## 2017-11-16 MED ORDER — CIPROFLOXACIN HCL 500 MG PO TABS
500.0000 mg | ORAL_TABLET | Freq: Once | ORAL | Status: AC
  Administered 2017-11-16: 500 mg via ORAL
  Filled 2017-11-16: qty 1

## 2017-11-16 MED ORDER — ADULT MULTIVITAMIN W/MINERALS CH
1.0000 | ORAL_TABLET | ORAL | Status: DC
  Administered 2017-11-18 – 2017-11-27 (×4): 1 via ORAL
  Filled 2017-11-16 (×4): qty 1

## 2017-11-16 MED ORDER — HEPARIN (PORCINE) IN NACL 2-0.9 UNIT/ML-% IJ SOLN
INTRAMUSCULAR | Status: AC
  Filled 2017-11-16: qty 500

## 2017-11-16 MED ORDER — TRAMADOL HCL 50 MG PO TABS
50.0000 mg | ORAL_TABLET | Freq: Two times a day (BID) | ORAL | Status: DC | PRN
Start: 2017-11-16 — End: 2017-11-23
  Administered 2017-11-22: 50 mg via ORAL
  Filled 2017-11-16 (×2): qty 1

## 2017-11-16 SURGICAL SUPPLY — 10 items
CATH PALINDROME RT-P 15FX23CM (CATHETERS) ×3 IMPLANT
DERMABOND ADVANCED (GAUZE/BANDAGES/DRESSINGS) ×2
DERMABOND ADVANCED .7 DNX12 (GAUZE/BANDAGES/DRESSINGS) ×1 IMPLANT
GUIDEWIRE SUPER STIFF .035X180 (WIRE) ×3 IMPLANT
PACK ANGIOGRAPHY (CUSTOM PROCEDURE TRAY) ×3 IMPLANT
SUT MNCRL 4-0 (SUTURE) ×2
SUT MNCRL 4-0 27XMFL (SUTURE) ×1
SUT PROLENE 0 CT 1 30 (SUTURE) ×3 IMPLANT
SUT VICRYL+ 3-0 36IN CT-1 (SUTURE) ×3 IMPLANT
SUTURE MNCRL 4-0 27XMF (SUTURE) ×1 IMPLANT

## 2017-11-16 NOTE — Progress Notes (Signed)
Subjective: Patient continues to have left sided complaints.    Objective: Current vital signs: BP (!) 152/93 (BP Location: Right Arm)   Pulse (!) 104   Temp 99.8 F (37.7 C) (Oral)   Resp 19   Ht 5\' 10"  (1.778 m)   Wt 78.6 kg (173 lb 4.5 oz)   SpO2 98%   BMI 24.86 kg/m  Vital signs in last 24 hours: Temp:  [98.2 F (36.8 C)-100 F (37.8 C)] 99.8 F (37.7 C) (03/25 1107) Pulse Rate:  [81-104] 104 (03/25 1430) Resp:  [12-22] 19 (03/25 1430) BP: (133-170)/(81-101) 152/93 (03/25 1430) SpO2:  [90 %-100 %] 98 % (03/25 1430) Weight:  [78.6 kg (173 lb 4.5 oz)] 78.6 kg (173 lb 4.5 oz) (03/25 1107)  Intake/Output from previous day: 03/24 0701 - 03/25 0700 In: 240 [P.O.:240] Out: -  Intake/Output this shift: No intake/output data recorded. Nutritional status: Fall precautions Skin care precautions Diet NPO time specified  Neurologic Exam: Mental Status: Alert, oriented, thought content appropriate.  Speech fluent without evidence of aphasia.  Able to follow 3 step commands without difficulty. Cranial Nerves: II: Discs flat bilaterally; Visual fields grossly normal, pupils equal, round, reactive to light and accommodation III,IV, VI: ptosis not present, extra-ocular motions intact bilaterally V,VII: smile symmetric, facial light touch sensation normal bilaterally VIII: hearing normal bilaterally IX,X: gag reflex present XI: bilateral shoulder shrug XII: midline tongue extension Motor: 5/5 in the upper extremities and LLE  Sensory: Pinprick and light touch impaired throughout below the neck but worse on the left as compared to the right, sparing the right side of the abdomen and worse on the lateral aspect of the left leg, above the knee worse than below the knee.     Lab Results: Basic Metabolic Panel: Recent Labs  Lab 11/10/17 0918 11/11/17 0703 11/12/17 0329 11/13/17 0334 11/14/17 0721  NA 138 135 135 132* 136  K 3.6 4.5 4.2 4.8 4.7  CL 100* 99* 101 98* 101  CO2  27 25 28 26 27   GLUCOSE 81 114* 94 90 88  BUN 40* 56* 32* 49* 30*  CREATININE 4.69* 6.35* 4.52* 6.50* 5.24*  CALCIUM 8.1* 7.8* 7.6* 7.6* 7.7*  MG  --  2.1 2.0  --   --   PHOS  --  5.2*  --   --   --     Liver Function Tests: No results for input(s): AST, ALT, ALKPHOS, BILITOT, PROT, ALBUMIN in the last 168 hours. No results for input(s): LIPASE, AMYLASE in the last 168 hours. No results for input(s): AMMONIA in the last 168 hours.  CBC: Recent Labs  Lab 11/10/17 0918 11/11/17 0703 11/12/17 0329 11/12/17 1631 11/13/17 0334 11/13/17 1923 11/14/17 0721 11/14/17 1327  WBC 27.9* 30.1* 16.3*  --  17.0*  --  18.7*  --   HGB 7.4* 6.7* 5.7* 7.8* 7.1* 7.3* 7.3* 7.4*  HCT 22.5* 19.6* 16.6* 23.3* 21.2* 22.0* 21.9* 22.1*  MCV 91.2 86.9 87.1  --  88.1  --  88.7  --   PLT 371 407 389  --  411  --  441*  --     Cardiac Enzymes: No results for input(s): CKTOTAL, CKMB, CKMBINDEX, TROPONINI in the last 168 hours.  Lipid Panel: No results for input(s): CHOL, TRIG, HDL, CHOLHDL, VLDL, LDLCALC in the last 168 hours.  CBG: No results for input(s): GLUCAP in the last 168 hours.  Microbiology: Results for orders placed or performed during the hospital encounter of 10/30/17  MRSA PCR  Screening     Status: None   Collection Time: 10/30/17  7:54 PM  Result Value Ref Range Status   MRSA by PCR NEGATIVE NEGATIVE Final    Comment:        The GeneXpert MRSA Assay (FDA approved for NASAL specimens only), is one component of a comprehensive MRSA colonization surveillance program. It is not intended to diagnose MRSA infection nor to guide or monitor treatment for MRSA infections. Performed at [facility], [address] [place], [place] [address]   Culture, respiratory (NON-Expectorated)     Status: None   Collection Time: 11/05/17  8:17 AM  Result Value Ref Range Status   Specimen Description   Final    TRACHEAL ASPIRATE Performed at [facility], [address] [place], [place] [address]    Special Requests   Final    NONE Performed at [facility], [address] [place], [place] [address]    Gram Stain   Final    FEW WBC PRESENT, PREDOMINANTLY PMN MODERATE GRAM NEGATIVE RODS RARE YEAST Performed at [facility], [address] [place], [place] [address]    Culture MODERATE ENTEROBACTER CLOACAE  Final   Report Status 11/08/2017 FINAL  Final   Organism ID, Bacteria ENTEROBACTER CLOACAE  Final      Susceptibility   Enterobacter cloacae - MIC*    CEFAZOLIN >=64 RESISTANT Resistant     CEFEPIME <=1 SENSITIVE Sensitive     CEFTAZIDIME <=1 SENSITIVE Sensitive     CEFTRIAXONE <=1 SENSITIVE Sensitive     CIPROFLOXACIN <=0.25 SENSITIVE Sensitive     GENTAMICIN <=1 SENSITIVE Sensitive     IMIPENEM <=0.25 SENSITIVE Sensitive     TRIMETH/SULFA <=20 SENSITIVE Sensitive     PIP/TAZO <=4 SENSITIVE Sensitive     * MODERATE ENTEROBACTER CLOACAE  Culture, blood (routine x 2)     Status: None   Collection Time: 11/09/17  8:49 PM  Result Value Ref Range Status   Specimen Description BLOOD DIALYSIS  Final   Special Requests   Final    BOTTLES DRAWN AEROBIC AND ANAEROBIC Blood Culture adequate volume   Culture   Final    NO GROWTH 5 DAYS Performed at [facility], [address] [place], [place] [address]    Report Status 11/14/2017 FINAL  Final    Coagulation Studies: No results for input(s): LABPROT, INR in the last 72 hours.  Imaging: No results found.  Medications:  I have reviewed the patient's current medications. Scheduled: . feeding supplement (ENSURE ENLIVE)  237 mL Oral TID BM  . ferrous sulfate  325 mg Oral BID WC  . gabapentin  100 mg Oral TID  . multivitamin  1 tablet Oral QHS  . [START ON 11/18/2017] multivitamin with minerals  1 tablet Oral Once per day on Mon Wed Fri  . nicotine  21 mg Transdermal Daily  . oxyCODONE  80 mg Oral Q12H  . QUEtiapine  25 mg Oral QHS  . senna  2 tablet Oral QHS  .  sodium chloride flush  10-40 mL Intracatheter Q12H  . vitamin C  250 mg Oral BID    Assessment/Plan: Sensory findings somewhat confusing.  Patient reports that they are improving.  MRI of the lumbar and thoracic spines show nothing to explain the patient's symptoms.    Recommendations: 1.  MRI of the cervical spine   LOS: 17 days   Thana FarrLeslie Kerron Sedano, MD Neurology [phone] 11/16/2017  3:09 PM

## 2017-11-16 NOTE — Progress Notes (Signed)
HD tx start   11/16/17 1112  Vital Signs  Pulse Rate 82  Pulse Rate Source Monitor  Resp 13  BP (!) 144/81  BP Location Right Arm  BP Method Automatic  Patient Position (if appropriate) Lying  Oxygen Therapy  SpO2 92 %  O2 Device Room Air  During Hemodialysis Assessment  Blood Flow Rate (mL/min) 400 mL/min  Arterial Pressure (mmHg) -140 mmHg  Venous Pressure (mmHg) 120 mmHg  Transmembrane Pressure (mmHg) 60 mmHg  Ultrafiltration Rate (mL/min) 860 mL/min  Dialysate Flow Rate (mL/min) 800 ml/min  Conductivity: Machine  14.1  HD Safety Checks Performed Yes  Dialysis Fluid Bolus Normal Saline  Bolus Amount (mL) 250 mL  Intra-Hemodialysis Comments Tx initiated  Hemodialysis Catheter Right Internal jugular Double-lumen  Placement Date/Time: 10/31/17 1100   Placed prior to admission: No  Time Out: Correct patient;Correct procedure  Maximum sterile barrier precautions: Hand hygiene;Sterile gloves;Large sterile sheet;Mask;Sterile gown;Cap  Site Prep: Chlorhexidine  Loca...  Blue Lumen Status Infusing  Red Lumen Status Infusing

## 2017-11-16 NOTE — Care Management (Signed)
Perma Cath to be placed today. Hemodialysis today. The weekend physical therapist recommended Inpatient Acute Rehab. Spoke with Asher MuirJamie Occupational therapist. States she is planning to see Mr. Edwyna ShellHart 11/17/17. Unable to discuss this recommendation with Mr. Edwyna ShellHart due to the fact that he is in dialysis Gwenette GreetBrenda S Graceanna Theissen RN MSN CCM Care Management [phone]

## 2017-11-16 NOTE — Progress Notes (Signed)
[facility] - [place] [facility]   PATIENT NAME: Joshua PlanasMark Seher    MR#:  [id number]  DATE OF BIRTH:  11/21/1990  SUBJECTIVE:   Pt. Seen at HD today and tolerating it well.  [facility].   REVIEW OF SYSTEMS:    Review of Systems  Constitutional: Negative for chills and fever.  HENT: Negative for congestion and tinnitus.   Eyes: Negative for blurred vision and double vision.  Respiratory: Negative for cough, shortness of breath and wheezing.   Cardiovascular: Negative for chest pain, orthopnea and PND.  Gastrointestinal: Negative for abdominal pain, diarrhea, nausea and vomiting.  Genitourinary: Negative for dysuria and hematuria.  Neurological: Negative for dizziness, sensory change and focal weakness.  All other systems reviewed and are negative.   Nutrition: Regular Tolerating Diet: yes Tolerating PT: Await Eval.    DRUG ALLERGIES:  No Known Allergies  VITALS:  Blood pressure (!) 155/81, pulse (!) 101, temperature 99.7 F (37.6 C), temperature source Oral, resp. rate 18, height 5\' 10"  (1.778 m), weight 76.2 kg (168 lb), SpO2 95 %.  PHYSICAL EXAMINATION:   Physical Exam  GENERAL:  27 y.o.-year-old patient lying in bed in no acute distress.  EYES: Pupils equal, round, reactive to light and accommodation. No scleral icterus. Extraocular muscles intact.  HEENT: Head atraumatic, normocephalic. Oropharynx and nasopharynx clear.  NECK:  Supple, no jugular venous distention. No thyroid enlargement, no tenderness.  LUNGS: Normal breath sounds bilaterally, no wheezing, rales, rhonchi. No use of accessory muscles of respiration.  CARDIOVASCULAR: S1, S2 normal. No murmurs, rubs, or gallops.  ABDOMEN: Soft, nontender, nondistended. Bowel sounds present. No organomegaly or mass.  EXTREMITIES: No cyanosis, clubbing or edema b/l.   S/p Right AKA NEUROLOGIC: Cranial nerves II through XII are intact. No focal Motor or sensory deficits b/l.   Globally weak.  PSYCHIATRIC: The patient is alert and oriented x 3.  SKIN: No obvious rash, lesion, or ulcer.  GU - + Scrotal Edema.   LABORATORY PANEL:   CBC Recent Labs  Lab 11/14/17 0721 11/14/17 1327  WBC 18.7*  --   HGB 7.3* 7.4*  HCT 21.9* 22.1*  PLT 441*  --    ------------------------------------------------------------------------------------------------------------------  Chemistries  Recent Labs  Lab 11/12/17 0329  11/14/17 0721  NA 135   < > 136  K 4.2   < > 4.7  CL 101   < > 101  CO2 28   < > 27  GLUCOSE 94   < > 88  BUN 32*   < > 30*  CREATININE 4.52*   < > 5.24*  CALCIUM 7.6*   < > 7.7*  MG 2.0  --   --    < > = values in this interval not displayed.   ------------------------------------------------------------------------------------------------------------------  Cardiac Enzymes No results for input(s): TROPONINI in the last 168 hours. ------------------------------------------------------------------------------------------------------------------  RADIOLOGY:  No results found.   ASSESSMENT AND PLAN:   27 year old male with known history of asthma, tobacco abuse, IVDA presents to hospital secondary to an unresponsive state.  1. Acutecardiac arrest - Most likely secondary to polysubstance overdose - resolved and pt. Presently stable.   2. Acuteend-stage renal disease  Secondary to cocaine induced rhabdomyolysis  -status post temporary dialysis catheter placement yesterday. Plan for Perm-cath today.  Cont. Dialysis as per Nephrology.  -Patient is not deemed ESRD yet and still acute renal failure.  Will need to discuss with nephrology about him progression to ESRD and he  will need outpatient spot.  3. Rhabdomyolysis, acute - Resolved, Secondary topossible ischemic right leg with compartment syndrome. - s/p right leg disarticulation at the [facility] 3/12/19followed by revision with AKA on November 11, 2017, patient recovering well  4.  Sepsis, acute Resolved Treated with course of IV Unasyn/vancomycin  5. DIC, acute - resolved. No acute issue.   6. 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  Compounded by polysubstance abuse - sitter discontinued.    8.Acute on chronic pain syndrome Compounded by right AKA - cont. Oxycontin, Morphine, Neurontin.   9.Acute on chronic anemia  Improved S/P 2 units PRBCs - follow Hg. And Transfuse if < 7.    10.  Acute Enterobacter infection - Noted on sputum culture 3/14 -Complete Cipro course for 5 days    All the records are reviewed and case discussed with Care Management/Social Worker. Management plans discussed with the patient, family and they are in agreement.  CODE STATUS: Full code  DVT Prophylaxis: Ted's & SCD's  TOTAL TIME TAKING CARE OF THIS PATIENT: 25 minutes.   POSSIBLE D/C IN 2-3DAYS, DEPENDING ON CLINICAL CONDITION.   Houston Siren M.D on 11/16/2017 at 3:47 PM  Between 7am to 6pm - Pager - [phone]  After 6pm go to [email or web address] - Social research officer, government  [facility] [place] [facility]  Office  [phone]  CC: Primary care physician; Patient, No Pcp Per

## 2017-11-16 NOTE — H&P (Signed)
[facility] History & Physical Update  The patient was interviewed and re-examined.  The patient's previous History and Physical has been reviewed and is unchanged.  There is no change in the plan of care. We plan to proceed with the scheduled procedure.  Festus BarrenJason Dew, MD  11/16/2017, 4:34 PM

## 2017-11-16 NOTE — Progress Notes (Signed)
HD tx end   11/16/17 1504  Vital Signs  Pulse Rate 93  Pulse Rate Source Monitor  Resp 15  BP (!) 174/98  BP Location Right Arm  BP Method Automatic  Patient Position (if appropriate) Lying  Oxygen Therapy  SpO2 94 %  O2 Device Room Air  During Hemodialysis Assessment  Dialysis Fluid Bolus Normal Saline  Bolus Amount (mL) 250 mL  Intra-Hemodialysis Comments Tx completed

## 2017-11-16 NOTE — Progress Notes (Signed)
Rehab Admissions Coordinator Note:  Patient was screened by Trish MageLogue, Taylor Spilde M for appropriateness for an Inpatient Acute Rehab Consult. Noted PT initially recommending HH services, but now PT recommending CIR.  I will follow up with unit case manager to discuss potential rehab venues for patient.  Call me for questions.  Trish MageLogue, Icie Kuznicki M 11/16/2017, 9:28 AM  I can be reached at [phone].

## 2017-11-16 NOTE — Progress Notes (Signed)
PT Cancellation Note  Patient Details Name: Levonne HubertMark A Jessee MRN: [id number] DOB: 05/07/1991   Cancelled Treatment:    Reason Eval/Treat Not Completed: Patient at procedure or test/unavailable(Treatment session attempted  Patient currently off unit for perm-cath placement.  Will continue efforts as patient available, medically appropriate.)  Mandy Peeks H. Manson PasseyBrown, PT, DPT, NCS 11/16/17, 11:09 AM [phone]

## 2017-11-16 NOTE — Progress Notes (Signed)
Post HD assessment. Pt tolerated tx well without c/o or complications. Net UF 2579, goal met. Pt's post temp. was slightly elevated, RN, MD and specials, all made aware.   11/16/17 1514  Vital Signs  Temp (!) 100.4 F (38 C)  Temp Source Oral  Pulse Rate 97  Pulse Rate Source Monitor  Resp 15  BP (!) 141/81  BP Location Right Arm  BP Method Automatic  Patient Position (if appropriate) Lying  Oxygen Therapy  SpO2 97 %  O2 Device Room Air  Dialysis Weight  Weight 76.3 kg (168 lb 3.4 oz)  Type of Weight Post-Dialysis  Post-Hemodialysis Assessment  Rinseback Volume (mL) 250 mL  KECN 84.2 V  Dialyzer Clearance Lightly streaked  Duration of HD Treatment -hour(s) 3.5 hour(s)  Hemodialysis Intake (mL) 500 mL  UF Total -Machine (mL) 3079 mL  Net UF (mL) 2579 mL  Tolerated HD Treatment Yes  Education / Care Plan  Dialysis Education Provided Yes  Documented Education in Care Plan Yes  Hemodialysis Catheter Right Internal jugular Double-lumen  Placement Date/Time: 10/31/17 1100   Placed prior to admission: No  Time Out: Correct patient;Correct procedure  Maximum sterile barrier precautions: Hand hygiene;Sterile gloves;Large sterile sheet;Mask;Sterile gown;Cap  Site Prep: Chlorhexidine  Loca...  Site Condition No complications  Blue Lumen Status Heparin locked  Red Lumen Status Heparin locked  Purple Lumen Status N/A  Catheter fill solution Heparin 1000 units/ml  Catheter fill volume (Arterial) 1.3 cc  Catheter fill volume (Venous) 1.3  Dressing Type Biopatch  Dressing Status Clean;Dry;Intact  Drainage Description None  Post treatment catheter status Capped and Clamped

## 2017-11-16 NOTE — Progress Notes (Signed)
Post HD assessment   11/16/17 1523  Neurological  Level of Consciousness Alert  Orientation Level Oriented X4  Respiratory  Respiratory Pattern Regular;Unlabored  Chest Assessment Chest expansion symmetrical  Bilateral Breath Sounds Clear;Diminished  Cardiac  Pulse Irregular  ECG Monitor Yes  Vascular  R Radial Pulse +2  L Radial Pulse +2  Integumentary  Integumentary (WDL) X  Skin Color Appropriate for ethnicity  Musculoskeletal  Musculoskeletal (WDL) X  Generalized Weakness Yes  Assistive Device None  GU Assessment  Genitourinary (WDL) X  Genitourinary Symptoms  (HD)  Psychosocial  Psychosocial (WDL) WDL  Emotional support given Given to patient

## 2017-11-16 NOTE — Progress Notes (Signed)
Pre HD assessment   11/16/17 1107  Vital Signs  Temp 99.8 F (37.7 C)  Temp Source Oral  Pulse Rate 83  Pulse Rate Source Monitor  Resp 12  BP (!) 153/90  BP Location Right Arm  BP Method Automatic  Patient Position (if appropriate) Lying  Oxygen Therapy  SpO2 93 %  O2 Device Room Air  Pain Assessment  Pain Scale 0-10  Pain Score 0  Dialysis Weight  Weight 78.6 kg (173 lb 4.5 oz)  Type of Weight Pre-Dialysis  Time-Out for Hemodialysis  What Procedure? HD  Pt Identifiers(min of two) First/Last Name;MRN/Account#  Correct Site? Yes  Correct Side? Yes  Correct Procedure? Yes  Consents Verified? Yes  Rad Studies Available? N/A  Safety Precautions Reviewed? Yes  Biochemist, clinicalMachine Checks  Machine Number  (3A)  Station Number 4  UF/Alarm Test Passed  Conductivity: Meter 13.6  Conductivity: Machine  13.8  pH 7.6  Reverse Osmosis main  Normal Saline Lot Number [id number]  Dialyzer Lot Number [id number]  Disposable Set Lot Number [id number]  Machine Temperature 98.6 F (37 C)  ImmunologistAir Detector Armed and Audible Yes  Blood Lines Intact and Secured Yes  Pre Treatment Patient Checks  Vascular access used during treatment Catheter  Hepatitis B Surface Antigen Results Negative  Date Hepatitis B Surface Antigen Drawn 11/07/17  Hepatitis B Surface Antibody  (<10)  Date Hepatitis B Surface Antibody Drawn 11/07/17  Hemodialysis Consent Verified Yes  Hemodialysis Standing Orders Initiated Yes  ECG (Telemetry) Monitor On Yes  Prime Ordered Normal Saline  Length of  DialysisTreatment -hour(s) 3.5 Hour(s)  Dialyzer Elisio 17H NR  Dialysate 3K, 2.5 Ca  Dialysis Anticoagulant None  Dialysate Flow Ordered 800  Blood Flow Rate Ordered 400 mL/min  Ultrafiltration Goal 2.5 Liters  Dialysis Blood Pressure Support Ordered Normal Saline  Education / Care Plan  Dialysis Education Provided Yes  Documented Education in Care Plan Yes  Hemodialysis Catheter Right Internal jugular Double-lumen  Placement  Date/Time: 10/31/17 1100   Placed prior to admission: No  Time Out: Correct patient;Correct procedure  Maximum sterile barrier precautions: Hand hygiene;Sterile gloves;Large sterile sheet;Mask;Sterile gown;Cap  Site Prep: Chlorhexidine  Loca...  Site Condition No complications  Blue Lumen Status Heparin locked  Red Lumen Status Heparin locked  Purple Lumen Status N/A  Dressing Type Biopatch  Dressing Status Clean;Dry;Intact  Drainage Description None

## 2017-11-16 NOTE — Progress Notes (Signed)
[facility]  ROUNDING NOTE   Subjective:  Patient seen at bedside. Appears to be in good spirits. Due for dialysis today.  Objective:  Vital signs in last 24 hours:  Temp:  [98.1 F (36.7 C)-100 F (37.8 C)] 99.8 F (37.7 C) (03/25 1107) Pulse Rate:  [82-94] 84 (03/25 1145) Resp:  [12-20] 14 (03/25 1145) BP: (133-164)/(81-98) 164/98 (03/25 1145) SpO2:  [92 %-98 %] 93 % (03/25 1145) Weight:  [78.6 kg (173 lb 4.5 oz)] 78.6 kg (173 lb 4.5 oz) (03/25 1107)  Weight change:  Filed Weights   11/13/17 1045 11/13/17 1451 11/16/17 1107  Weight: 79.3 kg (174 lb 13.2 oz) 78.9 kg (174 lb) 78.6 kg (173 lb 4.5 oz)    Intake/Output: I/O last 3 completed shifts: In: 240 [P.O.:240] Out: -    Intake/Output this shift:  No intake/output data recorded.  Physical Exam: General: NAD, laying in the bed  Head: [place]/AT  Eyes: Anicteric  Neck: Right IJ dialysis catheter  Lungs:  clear to auscultation  Heart: Regular, no rubs  Abdomen:  Soft, nontender, BS present  Extremities: Rt AKA, with Rt thigh edema, left leg edema  Neurologic: Alert, able to follow commands     Access: RIJ temp HD catheter 3/8 Dr. Lonn Georgiaonforti    Basic Metabolic Panel: Recent Labs  Lab 11/10/17 [id number] 11/11/17 0703 11/12/17 0329 11/13/17 0334 11/14/17 0721  NA 138 135 135 132* 136  K 3.6 4.5 4.2 4.8 4.7  CL 100* 99* 101 98* 101  CO2 27 25 28 26 27   GLUCOSE 81 114* 94 90 88  BUN 40* 56* 32* 49* 30*  CREATININE 4.69* 6.35* 4.52* 6.50* 5.24*  CALCIUM 8.1* 7.8* 7.6* 7.6* 7.7*  MG  --  2.1 2.0  --   --   PHOS  --  5.2*  --   --   --     Liver Function Tests: No results for input(s): AST, ALT, ALKPHOS, BILITOT, PROT, ALBUMIN in the last 168 hours. No results for input(s): LIPASE, AMYLASE in the last 168 hours. No results for input(s): AMMONIA in the last 168 hours.  CBC: Recent Labs  Lab 11/10/17 0918 11/11/17 0703 11/12/17 0329 11/12/17 1631 11/13/17 0334 11/13/17 1923 11/14/17 0721  11/14/17 1327  WBC 27.9* 30.1* 16.3*  --  17.0*  --  18.7*  --   HGB 7.4* 6.7* 5.7* 7.8* 7.1* 7.3* 7.3* 7.4*  HCT 22.5* 19.6* 16.6* 23.3* 21.2* 22.0* 21.9* 22.1*  MCV 91.2 86.9 87.1  --  88.1  --  88.7  --   PLT 371 407 389  --  411  --  441*  --     Cardiac Enzymes: No results for input(s): CKTOTAL, CKMB, CKMBINDEX, TROPONINI in the last 168 hours.  BNP: Invalid input(s): POCBNP  CBG: No results for input(s): GLUCAP in the last 168 hours.  Microbiology: Results for orders placed or performed during the hospital encounter of 10/30/17  MRSA PCR Screening     Status: None   Collection Time: 10/30/17  7:54 PM  Result Value Ref Range Status   MRSA by PCR NEGATIVE NEGATIVE Final    Comment:        The GeneXpert MRSA Assay (FDA approved for NASAL specimens only), is one component of a comprehensive MRSA colonization surveillance program. It is not intended to diagnose MRSA infection nor to guide or monitor treatment for MRSA infections. Performed at [facility], [address] [place], [place] [address]   Culture, respiratory (  NON-Expectorated)     Status: None   Collection Time: 11/05/17  8:17 AM  Result Value Ref Range Status   Specimen Description   Final    TRACHEAL ASPIRATE Performed at [facility], [address], [place], [place] [address]    Special Requests   Final    NONE Performed at [facility], [address] [place], [place] [address]    Gram Stain   Final    FEW WBC PRESENT, PREDOMINANTLY PMN MODERATE GRAM NEGATIVE RODS RARE YEAST Performed at [facility], [address] [place], [place] [address]    Culture MODERATE ENTEROBACTER CLOACAE  Final   Report Status 11/08/2017 FINAL  Final   Organism ID, Bacteria ENTEROBACTER CLOACAE  Final      Susceptibility   Enterobacter cloacae - MIC*    CEFAZOLIN >=64 RESISTANT Resistant     CEFEPIME <=1 SENSITIVE Sensitive     CEFTAZIDIME <=1 SENSITIVE Sensitive      CEFTRIAXONE <=1 SENSITIVE Sensitive     CIPROFLOXACIN <=0.25 SENSITIVE Sensitive     GENTAMICIN <=1 SENSITIVE Sensitive     IMIPENEM <=0.25 SENSITIVE Sensitive     TRIMETH/SULFA <=20 SENSITIVE Sensitive     PIP/TAZO <=4 SENSITIVE Sensitive     * MODERATE ENTEROBACTER CLOACAE  Culture, blood (routine x 2)     Status: None   Collection Time: 11/09/17  8:49 PM  Result Value Ref Range Status   Specimen Description BLOOD DIALYSIS  Final   Special Requests   Final    BOTTLES DRAWN AEROBIC AND ANAEROBIC Blood Culture adequate volume   Culture   Final    NO GROWTH 5 DAYS Performed at [facility], [address], [place], [place] [address]    Report Status 11/14/2017 FINAL  Final    Coagulation Studies: No results for input(s): LABPROT, INR in the last 72 hours.  Urinalysis: No results for input(s): COLORURINE, LABSPEC, PHURINE, GLUCOSEU, HGBUR, BILIRUBINUR, KETONESUR, PROTEINUR, UROBILINOGEN, NITRITE, LEUKOCYTESUR in the last 72 hours.  Invalid input(s): APPERANCEUR    Imaging: No results found.   Medications:    . feeding supplement (ENSURE ENLIVE)  237 mL Oral TID BM  . ferrous sulfate  325 mg Oral BID WC  . gabapentin  100 mg Oral TID  . multivitamin  1 tablet Oral QHS  . [START ON 11/18/2017] multivitamin with minerals  1 tablet Oral Once per day on Mon Wed Fri  . nicotine  21 mg Transdermal Daily  . oxyCODONE  80 mg Oral Q12H  . QUEtiapine  25 mg Oral QHS  . senna  2 tablet Oral QHS  . sodium chloride flush  10-40 mL Intracatheter Q12H  . vitamin C  250 mg Oral BID   acetaminophen, ALPRAZolam, bisacodyl, dextrose, morphine injection, nystatin cream, sennosides  Assessment/ Plan:  [name] is a 27 y.o. white male with asthma, bronchitis, polysubstance abuse, was admitted on3/8/2019after he was found down in a hotel for an unknown period of time in the fetal position. Hospital course includes prolonged CPR for 45 minutes in the emergency room.  Ventricular fibrillation arrest, hypothermia protocol.  Urine tox screen positive for cocaine and opiates.  Hospital course complicated by acute respiratory failure and acute renal failure. Started on CRRT 3/8. Underwent disarticulation of right lower extremity due to ischemic limb. Rhabdomyolysis with sustained CPK greater than 50,000 for several days.Self extubated on 3/15. Transitioned to intermittent hemodialysis on 3/16.   1.  Acute renal failure:  Acute renal failure secondary to ATN, shock, hypotension and rhabdomyolysis. Requiring renal replacement therapy. Urine output remains poor. -Patient high risk for progression to end-stage renal disease.  Due for dialysis today.  He will also need a PermCath in the relative near future.  2. Rhabdomyolysis: cocaine induced rhabdomyolysis. CPK trending down.  3.  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 Gilda Crease on 3/12.   Underwent revision AKA 3/19 -Still having right lower extremity pain.  Pain management as per hospitalist.  4. Scrotal edema Conservative treatment Urology evaluation 3/21 Dr Sherryl Barters  5. LE edema - Now has developed Rt leg edema;   -No significant response to IV Lasix.   -Continue ultrafiltration with dialysis for lower extremity edema.    LOS: 17 Joshua [name] 3/25/201911:56 AM

## 2017-11-16 NOTE — Progress Notes (Signed)
Pre HD assessment    11/16/17 1105  Neurological  Level of Consciousness Alert  Orientation Level Oriented X4  Respiratory  Respiratory Pattern Regular;Unlabored  Chest Assessment Chest expansion symmetrical  Bilateral Breath Sounds Clear;Diminished  Cardiac  Pulse Regular  ECG Monitor Yes  Vascular  R Radial Pulse +2  L Radial Pulse +2  Edema Generalized;Right upper extremity;Left upper extremity;Left lower extremity (right stump )  Integumentary  Integumentary (WDL) X  Musculoskeletal  Musculoskeletal (WDL) X  Generalized Weakness Yes  Assistive Device None  GU Assessment  Genitourinary (WDL) X  Genitourinary Symptoms  (HD)  Psychosocial  Psychosocial (WDL) X  Patient Behaviors Cooperative;Other (Comment);Sad (pt stated" I ain't worth shit" )  Emotional support given Given to patient

## 2017-11-16 NOTE — Progress Notes (Signed)
Nutrition Follow-up  DOCUMENTATION CODES:   Not applicable  INTERVENTION:   Continue Ensure Enlive po TID, each supplement provides 350 kcal and 20 grams of protein.  Provide daily MVI 3x per week  Add Rena-vite daily  Add snacks   NUTRITION DIAGNOSIS:   Inadequate oral intake related to decreased appetite as evidenced by per patient/family report.  GOAL:   Patient will meet greater than or equal to 90% of their needs  Progressing.  MONITOR:   PO intake, Supplement acceptance, Labs, Weight trends, Skin, I & O's  ASSESSMENT:   27 year old male with PMHx of bronchitis, asthma, polysubstance abuse who is admitted after being found unresponsive in hotel room s/p cardiac arrest with unknown down time and found to be in ventricular fibrillation, also with renal failure, rhabdomyolysis, leukocytosis, shock, and ischemic right lower extremity after being in fetal position. Patient was intubated 3/8 and started on 36C TTM.   Pt s/p R AKA revision 3/19  Pt reporting good appetite and oral intake. Per chart, pt eating anywhere from bites to 80% of meals. RD suspects pt may be eating food brought from outside the hospital. Pt is drinking some Ensure. Per chart, pt is weight stable. RD will add snacks. Pt on continued HD; will add rena-vite daily. Pt NPO for perm cath placement today.   Medications reviewed and include: ferrous sulfate, MVI, nicotine, oxycodone, senokot, vitamin C, morphine  Labs reviewed: none recent   Diet Order:  Fall precautions Skin care precautions Diet NPO time specified  EDUCATION NEEDS:   No education needs have been identified at this time  Skin:  Skin Assessment: Skin Integrity Issues:(MSAD to groin; closed incision right leg)  Last BM:  3/24- type 7  Height:   Ht Readings from Last 1 Encounters:  11/13/17 5\' 10"  (1.778 m)    Weight:   Wt Readings from Last 1 Encounters:  11/13/17 174 lb (78.9 kg)    Ideal Body Weight:  75.5 kg  BMI:   Body mass index is 24.97 kg/m.  Estimated Nutritional Needs:   Kcal:  2100-2400kcal/day   Protein:  95-110g/day   Fluid:  >2.3L/day or per MD  Betsey Holidayasey Jahmeir Geisen MS, RD, LDN Pager #[phone] After Hours Pager: [phone]

## 2017-11-16 NOTE — Op Note (Signed)
OPERATIVE NOTE    PRE-OPERATIVE DIAGNOSIS: 1. ESRD   POST-OPERATIVE DIAGNOSIS: same as above  PROCEDURE: 1. Fluoroscopic guidance for placement of catheter 2. Placement of a 23 cm tip to cuff tunneled hemodialysis catheter via the right internal jugular vein  SURGEON: Festus BarrenJason Jaslynn Thome, MD  ANESTHESIA:  Local with Moderate conscious sedation for approximately 15 minutes using 4 mg of Versed and 150 Mcg of Fentanyl  ESTIMATED BLOOD LOSS: 10 cc  FLUORO TIME: less than one minute  CONTRAST: none  FINDING(S): 1.  None  SPECIMEN(S):  None  INDICATIONS:   Levonne HubertMark A [name] is a 27 y.o.male who presents with prolonged renal failure after developing rhabdomyolysis from prolonged time down.  He has already lost his leg and his renal function has not returned.  The patient needs long term dialysis access for their ESRD, and a Permcath is necessary.  Risks and benefits are discussed and informed consent is obtained.    DESCRIPTION: After obtaining full informed written consent, the patient was brought back to the vascular suited. The patient's right neck and chest were sterilely prepped and draped in a sterile surgical field was created. Moderate conscious sedation was administered during a face to face encounter with the patient throughout the procedure with my supervision of the RN administering medicines and monitoring the patient's vital signs, pulse oximetry, telemetry and mental status throughout from the start of the procedure until the patient was taken to the recovery room.  The temporary catheter that had already been placed in the right jugular vein was also prepped into the field.  This was freed from its sutures and prepared for removal.  A wire was placed through 1 of the lumens of the temporary dialysis catheter and that catheter was removed. After skin nick and dilatation, the peel-away sheath was placed over the wire. I then turned my attention to an area under the clavicle. Approximately  1-2 fingerbreadths below the clavicle a small counterincision was created and tunneled from the subclavicular incision to the access site. Using fluoroscopic guidance, a 23 centimeter tip to cuff tunneled hemodialysis catheter was selected, and tunneled from the subclavicular incision to the access site. It was then placed through the peel-away sheath and the peel-away sheath was removed. Using fluoroscopic guidance the catheter tips were parked in the right atrium. The appropriate distal connectors were placed. It withdrew blood well and flushed easily with heparinized saline and a concentrated heparin solution was then placed. It was secured to the chest wall with 2 Prolene sutures. The access incision was closed single 4-0 Monocryl. A 4-0 Monocryl pursestring suture was placed around the exit site. Sterile dressings were placed. The patient tolerated the procedure well and was taken to the recovery room in stable condition.  COMPLICATIONS: None  CONDITION: Stable  Festus BarrenJason Delando Satter, MD 11/16/2017 5:08 PM   This note was created with Dragon Medical transcription system. Any errors in dictation are purely unintentional.

## 2017-11-17 ENCOUNTER — Encounter: Payer: Self-pay | Admitting: Vascular Surgery

## 2017-11-17 DIAGNOSIS — T796XXD Traumatic ischemia of muscle, subsequent encounter: Secondary | ICD-10-CM

## 2017-11-17 LAB — BASIC METABOLIC PANEL
ANION GAP: 9 (ref 5–15)
BUN: 24 mg/dL — ABNORMAL HIGH (ref 6–20)
CO2: 28 mmol/L (ref 22–32)
CREATININE: 5.49 mg/dL — AB (ref 0.61–1.24)
Calcium: 7.9 mg/dL — ABNORMAL LOW (ref 8.9–10.3)
Chloride: 99 mmol/L — ABNORMAL LOW (ref 101–111)
GFR, EST AFRICAN AMERICAN: 15 mL/min — AB (ref >60)
GFR, EST NON AFRICAN AMERICAN: 13 mL/min — AB (ref >60)
Glucose, Bld: 94 mg/dL (ref 65–99)
Potassium: 5.5 mmol/L — ABNORMAL HIGH (ref 3.5–5.1)
SODIUM: 136 mmol/L (ref 135–145)

## 2017-11-17 NOTE — Progress Notes (Signed)
[facility] - [place] at [facility]   PATIENT NAME: Joshua [name]    MR#:  [id number]  DATE OF BIRTH:  [birth year]  SUBJECTIVE:   No acute events overnight.  No complaints presently other than chronic pain issues.  REVIEW OF SYSTEMS:    Review of Systems  Constitutional: Negative for chills and fever.  HENT: Negative for congestion and tinnitus.   Eyes: Negative for blurred vision and double vision.  Respiratory: Negative for cough, shortness of breath and wheezing.   Cardiovascular: Negative for chest pain, orthopnea and PND.  Gastrointestinal: Negative for abdominal pain, diarrhea, nausea and vomiting.  Genitourinary: Negative for dysuria and hematuria.  Neurological: Negative for dizziness, sensory change and focal weakness.  All other systems reviewed and are negative.   Nutrition: Regular Tolerating Diet: yes Tolerating PT: Await Eval.    DRUG ALLERGIES:  No Known Allergies  VITALS:  Blood pressure (!) 155/92, pulse 99, temperature 99.4 F (37.4 C), temperature source Oral, resp. rate 18, height 5\' 10"  (1.778 m), weight 76.2 kg (168 lb), SpO2 94 %.  PHYSICAL EXAMINATION:   Physical Exam  GENERAL:  27 y.o.-year-old patient lying in bed in no acute distress.  EYES: Pupils equal, round, reactive to light and accommodation. No scleral icterus. Extraocular muscles intact.  HEENT: Head atraumatic, normocephalic. Oropharynx and nasopharynx clear.  NECK:  Supple, no jugular venous distention. No thyroid enlargement, no tenderness.  LUNGS: Normal breath sounds bilaterally, no wheezing, rales, rhonchi. No use of accessory muscles of respiration.  CARDIOVASCULAR: S1, S2 normal. No murmurs, rubs, or gallops.  ABDOMEN: Soft, nontender, nondistended. Bowel sounds present. No organomegaly or mass.  EXTREMITIES: No cyanosis, clubbing or edema b/l.   S/p Right AKA NEUROLOGIC: Cranial nerves II through XII are intact. No focal Motor or sensory deficits b/l.  Globally  weak.  PSYCHIATRIC: The patient is alert and oriented x 3.  SKIN: No obvious rash, lesion, or ulcer.  GU - + Scrotal Edema.   Right chest wall PermCath in place with no acute bleeding or drainage noted.  LABORATORY PANEL:   CBC Recent Labs  Lab 11/16/17 1545  WBC 13.2*  HGB 7.5*  HCT 22.3*  PLT 484*   ------------------------------------------------------------------------------------------------------------------  Chemistries  Recent Labs  Lab 11/12/17 0329  11/17/17 1107  NA 135   < > 136  K 4.2   < > 5.5*  CL 101   < > 99*  CO2 28   < > 28  GLUCOSE 94   < > 94  BUN 32*   < > 24*  CREATININE 4.52*   < > 5.49*  CALCIUM 7.6*   < > 7.9*  MG 2.0  --   --    < > = values in this interval not displayed.   ------------------------------------------------------------------------------------------------------------------  Cardiac Enzymes No results for input(s): TROPONINI in the last 168 hours. ------------------------------------------------------------------------------------------------------------------  RADIOLOGY:  Mr Cervical Spine Wo Contrast  Result Date: 11/16/2017 CLINICAL DATA:  27 y/o M; impaired pinprick and light touch below the neck worse on the left. EXAM: MRI CERVICAL SPINE WITHOUT CONTRAST TECHNIQUE: Multiplanar, multisequence MR imaging of the cervical spine was performed. No intravenous contrast was administered. COMPARISON:  03/15/2017 neck CT. FINDINGS: Alignment: Physiologic. Vertebrae: No fracture, evidence of discitis, or bone lesion. Cord: Normal signal and morphology. Posterior Fossa, vertebral arteries, paraspinal tissues: Negative. Disc levels: C2-3: No significant disc displacement, foraminal stenosis, or canal stenosis. C3-4: No significant disc displacement, foraminal stenosis, or canal stenosis. C4-5: No significant disc  displacement, foraminal stenosis, or canal stenosis. C5-6: Mild discogenic degenerative changes with loss of disc space height  and a small disc bulge effacing the ventral thecal sac with small central annular fissure. No significant foraminal or canal stenosis. C6-7: No significant disc displacement, foraminal stenosis, or canal stenosis. C7-T1: No significant disc displacement, foraminal stenosis, or canal stenosis. IMPRESSION: 1. No acute osseous or cord signal abnormality. 2. Mild C5-6 discogenic degenerative changes with a small disc bulge and central annular fissure effacing ventral thecal sac. No cord compression, foraminal stenosis, or canal stenosis. Electronically Signed   By: Mitzi HansenLance  Furusawa-Stratton M.D.   On: 11/16/2017 22:49     ASSESSMENT AND PLAN:   27 year old male with known history of asthma, tobacco abuse, IVDA presents to hospital secondary to an unresponsive state.  1. Acutecardiac arrest - Most likely secondary to polysubstance overdose - resolved and pt. Presently stable.   2. Acuteend-stage renal disease  Secondary to cocaine induced rhabdomyolysis  -Status post PermCath placement now and Cont. Dialysis as per Nephrology.  -Patient is not deemed ESRD yet and still acute renal failure.  Await further Nephro input.    3. Rhabdomyolysis, acute - Resolved, Secondary topossible ischemic right leg with compartment syndrome. [facility] by revision with AKA on November 11, 2017, patient recovering well  4. Sepsis, acute Resolved Treated with course of IV Unasyn/vancomycin  5. Hyperkalemia - due to acute on chronic renal failure.  - follow Potassium with dialysis.   6. 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  Compounded by polysubstance abuse - sitter discontinued.    8.Acute on chronic pain syndrome Compounded by right AKA -  cont. Oxycontin, Morphine, Neurontin.   9.Acute on chronic anemia  Improved S/P 2 units PRBCs - follow Hg. And Transfuse if < 7.    10.  Acute Enterobacter infection - Noted on sputum culture 3/14 -Complete Cipro course for 5 days    All the records are reviewed and case discussed with Care Management/Social Worker. Management plans discussed with the patient, family and they are in agreement.  CODE STATUS: Full code  DVT Prophylaxis: Ted's & SCD's  TOTAL TIME TAKING CARE OF THIS PATIENT: 25 minutes.   POSSIBLE D/C IN 2-3 DAYS, DEPENDING ON CLINICAL CONDITION.   Houston SirenSAINANI,VIVEK J M.D on 11/17/2017 at 3:05 PM  Between 7am to 6pm - Pager - [phone]  After 6pm go to [email or web address] - Social research officer, governmentpassword [email or web address]  [facility] [place] [facility]  Office  [phone]  CC: Primary care physician; Patient, No Pcp Per

## 2017-11-17 NOTE — Progress Notes (Signed)
Subjective: No new complaints.  Symptoms unchanged per patient.  Objective: Current vital signs: BP (!) 155/92 (BP Location: Left Arm)   Pulse 99   Temp 99.4 F (37.4 C) (Oral)   Resp 18   Ht 5\' 10"  (1.778 m)   Wt 76.2 kg (168 lb)   SpO2 94%   BMI 24.11 kg/m  Vital signs in last 24 hours: Temp:  [98.6 F (37 C)-100.4 F (38 C)] 99.4 F (37.4 C) (03/26 1309) Pulse Rate:  [82-111] 99 (03/26 1309) Resp:  [12-20] 18 (03/26 1309) BP: (131-174)/(79-115) 155/92 (03/26 1309) SpO2:  [92 %-98 %] 94 % (03/26 1309) Weight:  [76.2 kg (168 lb)-76.3 kg (168 lb 3.4 oz)] 76.2 kg (168 lb) (03/25 1529)  Intake/Output from previous day: 03/25 0701 - 03/26 0700 In: 240 [P.O.:240] Out: 2579  Intake/Output this shift: No intake/output data recorded. Nutritional status: Fall precautions Skin care precautions Diet renal with fluid restriction Fluid restriction: 1200 mL Fluid; Room service appropriate? Yes; Fluid consistency: Thin  Neurologic Exam: Mental Status: Alert, oriented, thought content appropriate.  Speech fluent without evidence of aphasia.  Able to follow 3 step commands without difficulty. Cranial Nerves: II: Discs flat bilaterally; Visual fields grossly normal, pupils equal, round, reactive to light and accommodation III,IV, VI: ptosis not present, extra-ocular motions intact bilaterally V,VII: smile symmetric, facial light touch sensation normal bilaterally VIII: hearing normal bilaterally IX,X: gag reflex present XI: bilateral shoulder shrug XII: midline tongue extension Motor: 5/5 in the upper extremities and LLE  Sensory: Pinprick and light touch impaired throughout below the neck but worse on the left as compared to the right, sparing the right side of the abdomen and worse on the lateral aspect of the left leg, above the knee worse than below the knee.     Lab Results: Basic Metabolic Panel: Recent Labs  Lab 11/11/17 0703 11/12/17 0329 11/13/17 0334 11/14/17 0721  11/17/17 1107  NA 135 135 132* 136 136  K 4.5 4.2 4.8 4.7 5.5*  CL 99* 101 98* 101 99*  CO2 25 28 26 27 28   GLUCOSE 114* 94 90 88 94  BUN 56* 32* 49* 30* 24*  CREATININE 6.35* 4.52* 6.50* 5.24* 5.49*  CALCIUM 7.8* 7.6* 7.6* 7.7* 7.9*  MG 2.1 2.0  --   --   --   PHOS 5.2*  --   --   --   --     Liver Function Tests: No results for input(s): AST, ALT, ALKPHOS, BILITOT, PROT, ALBUMIN in the last 168 hours. No results for input(s): LIPASE, AMYLASE in the last 168 hours. No results for input(s): AMMONIA in the last 168 hours.  CBC: Recent Labs  Lab 11/11/17 0703 11/12/17 0329  11/13/17 0334 11/13/17 1923 11/14/17 0721 11/14/17 1327 11/16/17 1545  WBC 30.1* 16.3*  --  17.0*  --  18.7*  --  13.2*  HGB 6.7* 5.7*   < > 7.1* 7.3* 7.3* 7.4* 7.5*  HCT 19.6* 16.6*   < > 21.2* 22.0* 21.9* 22.1* 22.3*  MCV 86.9 87.1  --  88.1  --  88.7  --  89.2  PLT 407 389  --  411  --  441*  --  484*   < > = values in this interval not displayed.    Cardiac Enzymes: No results for input(s): CKTOTAL, CKMB, CKMBINDEX, TROPONINI in the last 168 hours.  Lipid Panel: No results for input(s): CHOL, TRIG, HDL, CHOLHDL, VLDL, LDLCALC in the last 168 hours.  CBG: No  results for input(s): GLUCAP in the last 168 hours.  Microbiology: Results for orders placed or performed during the hospital encounter of 10/30/17  MRSA PCR Screening     Status: None   Collection Time: 10/30/17  7:54 PM  Result Value Ref Range Status   MRSA by PCR NEGATIVE NEGATIVE Final    Comment:        The GeneXpert MRSA Assay (FDA approved for NASAL specimens only), is one component of a comprehensive MRSA colonization surveillance program. It is not intended to diagnose MRSA infection nor to guide or monitor treatment for MRSA infections. Performed at [facility], [address] [place], [place] [address]   Culture, respiratory (NON-Expectorated)     Status: None   Collection Time: 11/05/17  8:17 AM  Result  Value Ref Range Status   Specimen Description   Final    TRACHEAL ASPIRATE Performed at [facility], [address] [place], [place] [address]    Special Requests   Final    NONE Performed at [facility], [address] [place], [place] [address]    Gram Stain   Final    FEW WBC PRESENT, PREDOMINANTLY PMN MODERATE GRAM NEGATIVE RODS RARE YEAST Performed at [facility], [address] [place], [place] [address]    Culture MODERATE ENTEROBACTER CLOACAE  Final   Report Status 11/08/2017 FINAL  Final   Organism ID, Bacteria ENTEROBACTER CLOACAE  Final      Susceptibility   Enterobacter cloacae - MIC*    CEFAZOLIN >=64 RESISTANT Resistant     CEFEPIME <=1 SENSITIVE Sensitive     CEFTAZIDIME <=1 SENSITIVE Sensitive     CEFTRIAXONE <=1 SENSITIVE Sensitive     CIPROFLOXACIN <=0.25 SENSITIVE Sensitive     GENTAMICIN <=1 SENSITIVE Sensitive     IMIPENEM <=0.25 SENSITIVE Sensitive     TRIMETH/SULFA <=20 SENSITIVE Sensitive     PIP/TAZO <=4 SENSITIVE Sensitive     * MODERATE ENTEROBACTER CLOACAE  Culture, blood (routine x 2)     Status: None   Collection Time: 11/09/17  8:49 PM  Result Value Ref Range Status   Specimen Description BLOOD DIALYSIS  Final   Special Requests   Final    BOTTLES DRAWN AEROBIC AND ANAEROBIC Blood Culture adequate volume   Culture   Final    NO GROWTH 5 DAYS Performed at [facility], [address] [place], [place] [address]    Report Status 11/14/2017 FINAL  Final    Coagulation Studies: No results for input(s): LABPROT, INR in the last 72 hours.  Imaging: Mr Cervical Spine Wo Contrast  Result Date: 11/16/2017 CLINICAL DATA:  27 y/o M; impaired pinprick and light touch below the neck worse on the left. EXAM: MRI CERVICAL SPINE WITHOUT CONTRAST TECHNIQUE: Multiplanar, multisequence MR imaging of the cervical spine was performed. No intravenous contrast was administered. COMPARISON:  03/15/2017 neck CT.  FINDINGS: Alignment: Physiologic. Vertebrae: No fracture, evidence of discitis, or bone lesion. Cord: Normal signal and morphology. Posterior Fossa, vertebral arteries, paraspinal tissues: Negative. Disc levels: C2-3: No significant disc displacement, foraminal stenosis, or canal stenosis. C3-4: No significant disc displacement, foraminal stenosis, or canal stenosis. C4-5: No significant disc displacement, foraminal stenosis, or canal stenosis. C5-6: Mild discogenic degenerative changes with loss of disc space height and a small disc bulge effacing the ventral thecal sac with small central annular fissure. No significant foraminal or canal stenosis. C6-7: No significant disc displacement, foraminal stenosis, or canal stenosis. C7-T1:  No significant disc displacement, foraminal stenosis, or canal stenosis. IMPRESSION: 1. No acute osseous or cord signal abnormality. 2. Mild C5-6 discogenic degenerative changes with a small disc bulge and central annular fissure effacing ventral thecal sac. No cord compression, foraminal stenosis, or canal stenosis. Electronically Signed   By: Mitzi Hansen M.D.   On: 11/16/2017 22:49    Medications:  I have reviewed the patient's current medications. Scheduled: . feeding supplement (ENSURE ENLIVE)  237 mL Oral TID BM  . ferrous sulfate  325 mg Oral BID WC  . gabapentin  100 mg Oral TID  . multivitamin  1 tablet Oral QHS  . [START ON 11/18/2017] multivitamin with minerals  1 tablet Oral Once per day on Mon Wed Fri  . nicotine  21 mg Transdermal Daily  . oxyCODONE  80 mg Oral Q12H  . QUEtiapine  25 mg Oral QHS  . senna  2 tablet Oral QHS  . sodium chloride flush  10-40 mL Intracatheter Q12H  . vitamin C  250 mg Oral BID    Assessment/Plan: Symptoms continue.  MRI of the cervical spine without findings to explain symptoms.  Has now had MRI of the brain and spinal cord without etiology noted.    Would continue therapy.     LOS: 18 days   Thana Farr, MD Neurology [phone] 11/17/2017  2:12 PM

## 2017-11-17 NOTE — Progress Notes (Signed)
PT Cancellation Note  Patient Details Name: Joshua HubertMark A Eilert MRN: [id number] DOB: 05/04/1991   Cancelled Treatment:    Reason Eval/Treat Not Completed: Patient not medically ready   Chart reviewed.  HgB 7.5, HCT 22.3 and Potassium 5.5.  Labs outside PT parameters.  Will hold session and continue as appropriate.   Danielle DessSarah Josanna Hefel 11/17/2017, 12:47 PM

## 2017-11-17 NOTE — Care Management (Signed)
Parent admitted s/p cardiac arrest.  Patient now with new AKA.  Patient states that he lives at home with his sister, and will plan to go back with his sister Rosey Bath(Teresa) when appropriate.  Sister was on the phone with patient during my assessment.  Confirmed address in Epic is correct.  PT has assessed patient and recommend CIR.  Patient states that he is agreeable to pursue CIR.  If patient returns home he will need charity home health and equipment.   Jason with [facility] given a heads up.  Patient does not have PCP.  New patient appointment made a [place] per patients request.  Appointment was made with Shonna ChockNoah Wouk 4/5 at 9:40 am.    Per nephrology patient is likely end stage renal.  [name] HD liaison is aware of the need for outpatient HD set up.

## 2017-11-17 NOTE — Progress Notes (Signed)
Trying to urinate by setting up on on the  side of the bed. Requested to have bed alarm off because it distracts his focus of urination. Instructed to call me when done urinating.

## 2017-11-17 NOTE — Progress Notes (Signed)
Occupational Therapy Treatment Patient Details Name: Joshua HubertMark A [name] MRN: [id number] DOB: 03/27/1991 Today's Date: 11/17/2017    History of present illness presented to ER after being found unresponsive, down for unknown period of time, related to ingestion of controlled substances.  Patient suffered vfib arrest en route and underwent 45 minutes CPR; subsequently transitioned to hypothermia cooling protocol.  Remained intubated and sedated until self-extubation 3/15. UDS positive for cocaine and opiates.  Hospital course additionally significant for severe rhabomyolisis with ischemic R LE (status post emergent knee disarticulation 3/12 with revision to R AKA 3/15); initiation of CRRT due to renal failure, now transitioned to temporary dialysis via R IJ, scheduled for conversion to permcath with HD next date.      OT comments  Pt. Was cleared by nursing to engage in OT session. Pt. Was seen at bed level. Pt. Continues to have increased edema in the right LE, and scrotum. Pt. Presents with fatigue. Pt. Education was provided about positioning, A/E, ADL functioning, and work simplification strategies for self-care. Pt. Continues to benefit from OT services for ADL training, A/E training, and pt. Education about home modification, and DME. Pt. Would benefit from CIR upon discharge with follow-up OT services.   Follow Up Recommendations  CIR    Equipment Recommendations       Recommendations for Other Services      Precautions / Restrictions Precautions Precautions: Fall Precaution Comments: pressure area to L heel, R IJ temp dialysis catheter Restrictions Weight Bearing Restrictions: Yes RLE Weight Bearing: Non weight bearing       Mobility Bed Mobility                  Transfers                 General transfer comment: Mobility deferred this date.    Balance                                           ADL either performed or assessed with clinical  judgement   ADL Overall ADL's : Needs assistance/impaired Eating/Feeding: Set up;Bed level   Grooming: Set up;Bed level;Min guard   Upper Body Bathing: Set up;Bed level;Independent   Lower Body Bathing: Set up;Bed level;Moderate assistance   Upper Body Dressing : Set up;Minimal assistance   Lower Body Dressing: Set up;Moderate assistance                 General ADL Comments: Pt. education was provided about positioning, A/E     Vision Baseline Vision/History: No visual deficits     Perception     Praxis      Cognition Arousal/Alertness: Awake/alert Behavior During Therapy: WFL for tasks assessed/performed Overall Cognitive Status: History of cognitive impairments - at baseline                                          Exercises     Shoulder Instructions       General Comments      Pertinent Vitals/ Pain       Pain Assessment: 0-10 Pain Score: 10-Worst pain ever Pain Descriptors / Indicators: Aching Pain Intervention(s): Limited activity within patient's tolerance;Monitored during session;Premedicated before session  Home Living Family/patient expects to be discharged to:: Private residence Living Arrangements:  Other relatives Available Help at Discharge: Family Type of Home: Mobile home Home Access: Stairs to enter Entrance Stairs-Number of Steps: 2 Entrance Stairs-Rails: Can reach both Home Layout: One level               Home Equipment: None          Prior Functioning/Environment Level of Independence: Independent            Frequency  Min 2X/week        Progress Toward Goals  OT Goals(current goals can now be found in the care plan section)  Progress towards OT goals: Progressing toward goals  Acute Rehab OT Goals Patient Stated Goal: To be able to do things for himself again  Plan Discharge plan remains appropriate    Co-evaluation                 AM-PAC PT "6 Clicks" Daily Activity      Outcome Measure   Help from another person eating meals?: None Help from another person taking care of personal grooming?: A Little Help from another person toileting, which includes using toliet, bedpan, or urinal?: A Lot Help from another person bathing (including washing, rinsing, drying)?: A Lot Help from another person to put on and taking off regular upper body clothing?: A Little Help from another person to put on and taking off regular lower body clothing?: A Lot 6 Click Score: 16    End of Session    OT Visit Diagnosis: Muscle weakness (generalized) (M62.81);Unsteadiness on feet (R26.81);Pain   Activity Tolerance Patient limited by fatigue;Patient limited by pain   Patient Left in bed;with call bell/phone within reach;with chair alarm set   Nurse Communication          Time: 1610-9604 OT Time Calculation (min): 20 min  Charges: OT General Charges $OT Visit: 1 Visit  Olegario Messier, MS, OTR/L    Olegario Messier, MS, OTR/L 11/17/2017, 4:42 PM

## 2017-11-17 NOTE — Progress Notes (Signed)
Pt. has been trying to urinate since am. Bladder scan result is 84 mL. Pt. wants to continue trying to urinate

## 2017-11-17 NOTE — Progress Notes (Signed)
[facility]  ROUNDING NOTE   Subjective:  Right internal jugular PermCath basement has been performed. Patient did have dialysis yesterday. Still has slightly high potassium at 5.5. May have approached end-stage renal disease. Very little urine output noted.  Objective:  Vital signs in last 24 hours:  Temp:  [98.6 F (37 C)-100.4 F (38 C)] 99.4 F (37.4 C) (03/26 1309) Pulse Rate:  [82-111] 99 (03/26 1309) Resp:  [12-20] 18 (03/26 1309) BP: (131-174)/(79-115) 155/92 (03/26 1309) SpO2:  [92 %-98 %] 94 % (03/26 1309) Weight:  [76.2 kg (168 lb)-76.3 kg (168 lb 3.4 oz)] 76.2 kg (168 lb) (03/25 1529)  Weight change:  Filed Weights   11/16/17 1107 11/16/17 1514 11/16/17 1529  Weight: 78.6 kg (173 lb 4.5 oz) 76.3 kg (168 lb 3.4 oz) 76.2 kg (168 lb)    Intake/Output: I/O last 3 completed shifts: In: 240 [P.O.:240] Out: 2579 [Other:2579]   Intake/Output this shift:  No intake/output data recorded.  Physical Exam: General: NAD, laying in the bed  Head: [place]/AT  Eyes: Anicteric  Neck: supple  Lungs:  clear to auscultation, normal effort  Heart: Regular, no rubs  Abdomen:  Soft, nontender, BS present  Extremities: Rt AKA, with Rt thigh edema, left leg edema  Neurologic: Alert, able to follow commands     Access: RIJ Permcath 11/16/17    Basic Metabolic Panel: Recent Labs  Lab 11/11/17 0703 11/12/17 0329 11/13/17 0334 11/14/17 0721 11/17/17 1107  NA 135 135 132* 136 136  K 4.5 4.2 4.8 4.7 5.5*  CL 99* 101 98* 101 99*  CO2 25 28 26 27 28   GLUCOSE 114* 94 90 88 94  BUN 56* 32* 49* 30* 24*  CREATININE 6.35* 4.52* 6.50* 5.24* 5.49*  CALCIUM 7.8* 7.6* 7.6* 7.7* 7.9*  MG 2.1 2.0  --   --   --   PHOS 5.2*  --   --   --   --     Liver Function Tests: No results for input(s): AST, ALT, ALKPHOS, BILITOT, PROT, ALBUMIN in the last 168 hours. No results for input(s): LIPASE, AMYLASE in the last 168 hours. No results for input(s): AMMONIA in the last 168  hours.  CBC: Recent Labs  Lab 11/11/17 0703 11/12/17 0329  11/13/17 0334 11/13/17 1923 11/14/17 0721 11/14/17 1327 11/16/17 1545  WBC 30.1* 16.3*  --  17.0*  --  18.7*  --  13.2*  HGB 6.7* 5.7*   < > 7.1* 7.3* 7.3* 7.4* 7.5*  HCT 19.6* 16.6*   < > 21.2* 22.0* 21.9* 22.1* 22.3*  MCV 86.9 87.1  --  88.1  --  88.7  --  89.2  PLT 407 389  --  411  --  441*  --  484*   < > = values in this interval not displayed.    Cardiac Enzymes: No results for input(s): CKTOTAL, CKMB, CKMBINDEX, TROPONINI in the last 168 hours.  BNP: Invalid input(s): POCBNP  CBG: No results for input(s): GLUCAP in the last 168 hours.  Microbiology: Results for orders placed or performed during the hospital encounter of 10/30/17  MRSA PCR Screening     Status: None   Collection Time: 10/30/17  7:54 PM  Result Value Ref Range Status   MRSA by PCR NEGATIVE NEGATIVE Final    Comment:        The GeneXpert MRSA Assay (FDA approved for NASAL specimens only), is one component of a comprehensive MRSA colonization surveillance program. It is not intended  to diagnose MRSA infection nor to guide or monitor treatment for MRSA infections. Performed at [facility], [address] [place], [place] [address]   Culture, respiratory (NON-Expectorated)     Status: None   Collection Time: 11/05/17  8:17 AM  Result Value Ref Range Status   Specimen Description   Final    TRACHEAL ASPIRATE Performed at [facility], [address], [place], [place] [address]    Special Requests   Final    NONE Performed at [facility], [address] [place], [place] [address]    Gram Stain   Final    FEW WBC PRESENT, PREDOMINANTLY PMN MODERATE GRAM NEGATIVE RODS RARE YEAST Performed at [facility], [address], [place], [place] [address]    Culture MODERATE ENTEROBACTER CLOACAE  Final   Report Status 11/08/2017 FINAL  Final   Organism ID, Bacteria ENTEROBACTER CLOACAE  Final       Susceptibility   Enterobacter cloacae - MIC*    CEFAZOLIN >=64 RESISTANT Resistant     CEFEPIME <=1 SENSITIVE Sensitive     CEFTAZIDIME <=1 SENSITIVE Sensitive     CEFTRIAXONE <=1 SENSITIVE Sensitive     CIPROFLOXACIN <=0.25 SENSITIVE Sensitive     GENTAMICIN <=1 SENSITIVE Sensitive     IMIPENEM <=0.25 SENSITIVE Sensitive     TRIMETH/SULFA <=20 SENSITIVE Sensitive     PIP/TAZO <=4 SENSITIVE Sensitive     * MODERATE ENTEROBACTER CLOACAE  Culture, blood (routine x 2)     Status: None   Collection Time: 11/09/17  8:49 PM  Result Value Ref Range Status   Specimen Description BLOOD DIALYSIS  Final   Special Requests   Final    BOTTLES DRAWN AEROBIC AND ANAEROBIC Blood Culture adequate volume   Culture   Final    NO GROWTH 5 DAYS Performed at [facility], [address], [place], [place] [address]    Report Status 11/14/2017 FINAL  Final    Coagulation Studies: No results for input(s): LABPROT, INR in the last 72 hours.  Urinalysis: No results for input(s): COLORURINE, LABSPEC, PHURINE, GLUCOSEU, HGBUR, BILIRUBINUR, KETONESUR, PROTEINUR, UROBILINOGEN, NITRITE, LEUKOCYTESUR in the last 72 hours.  Invalid input(s): APPERANCEUR    Imaging: Mr Cervical Spine Wo Contrast  Result Date: 11/16/2017 CLINICAL DATA:  27 y/o M; impaired pinprick and light touch below the neck worse on the left. EXAM: MRI CERVICAL SPINE WITHOUT CONTRAST TECHNIQUE: Multiplanar, multisequence MR imaging of the cervical spine was performed. No intravenous contrast was administered. COMPARISON:  03/15/2017 neck CT. FINDINGS: Alignment: Physiologic. Vertebrae: No fracture, evidence of discitis, or bone lesion. Cord: Normal signal and morphology. Posterior Fossa, vertebral arteries, paraspinal tissues: Negative. Disc levels: C2-3: No significant disc displacement, foraminal stenosis, or canal stenosis. C3-4: No significant disc displacement, foraminal stenosis, or canal stenosis. C4-5: No significant disc  displacement, foraminal stenosis, or canal stenosis. C5-6: Mild discogenic degenerative changes with loss of disc space height and a small disc bulge effacing the ventral thecal sac with small central annular fissure. No significant foraminal or canal stenosis. C6-7: No significant disc displacement, foraminal stenosis, or canal stenosis. C7-T1: No significant disc displacement, foraminal stenosis, or canal stenosis. IMPRESSION: 1. No acute osseous or cord signal abnormality. 2. Mild C5-6 discogenic degenerative changes with a small disc bulge and central annular fissure effacing ventral thecal sac. No cord compression, foraminal stenosis, or canal stenosis. Electronically Signed   By: Mitzi Hansen M.D.   On: 11/16/2017 22:49  Medications:    . feeding supplement (ENSURE ENLIVE)  237 mL Oral TID BM  . ferrous sulfate  325 mg Oral BID WC  . gabapentin  100 mg Oral TID  . multivitamin  1 tablet Oral QHS  . [START ON 11/18/2017] multivitamin with minerals  1 tablet Oral Once per day on Mon Wed Fri  . nicotine  21 mg Transdermal Daily  . oxyCODONE  80 mg Oral Q12H  . QUEtiapine  25 mg Oral QHS  . senna  2 tablet Oral QHS  . sodium chloride flush  10-40 mL Intracatheter Q12H  . vitamin C  250 mg Oral BID   acetaminophen, ALPRAZolam, bisacodyl, dextrose, morphine injection, nystatin cream, sennosides, traMADol  Assessment/ Plan:  [name] is a 27 y.o. white male with asthma, bronchitis, polysubstance abuse, was admitted on3/8/2019after he was found down in a hotel for an unknown period of time in the fetal position. Hospital course includes prolonged CPR for 45 minutes in the emergency room. Ventricular fibrillation arrest, hypothermia protocol.  Urine tox screen positive for cocaine and opiates.  Hospital course complicated by acute respiratory failure and acute renal failure. Started on CRRT 3/8. Underwent disarticulation of right lower extremity due to ischemic limb.  Rhabdomyolysis with sustained CPK greater than 50,000 for several days.Self extubated on 3/15. Transitioned to intermittent hemodialysis on 3/16.   1.  Acute renal failure:  Acute renal failure secondary to ATN, shock, hypotension and rhabdomyolysis. Requiring renal replacement therapy. Urine output remains poor. - suspect that the patient may be in end-stage renal disease.  Very little urine output at this time and patient still has a high creatinine as well as potassium.  We will likely need to clear the patient end-stage renal disease later this week.  We will plan for dialysis again tomorrow.  2. Rhabdomyolysis: cocaine induced rhabdomyolysis. CK has trended down.   3.  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 Gilda Crease on 3/12.   Underwent revision AKA 3/19 -Pain management per hospitalist.   4. Scrotal edema Conservative treatment Urology evaluation 3/21 Dr Sherryl Barters  5. LE edema - Continue UF with HD.    LOS: 18 Shauna Bodkins 3/26/20191:55 PM

## 2017-11-18 MED ORDER — DARBEPOETIN ALFA 100 MCG/0.5ML IJ SOSY
100.0000 ug | PREFILLED_SYRINGE | INTRAMUSCULAR | Status: DC
  Administered 2017-11-20: 100 ug via INTRAVENOUS
  Filled 2017-11-18: qty 0.5

## 2017-11-18 MED ORDER — HYDRALAZINE HCL 20 MG/ML IJ SOLN
10.0000 mg | Freq: Four times a day (QID) | INTRAMUSCULAR | Status: DC | PRN
  Administered 2017-11-18: 10 mg via INTRAVENOUS
  Filled 2017-11-18: qty 1

## 2017-11-18 NOTE — Progress Notes (Signed)
[facility]  ROUNDING NOTE   Subjective:  No significant improvement noted renal function. It appears that the patient does have end-stage renal disease at this time. Patient seen and evaluated during hemodialysis.  Objective:  Vital signs in last 24 hours:  Temp:  [99.1 F (37.3 C)-100.9 F (38.3 C)] 100.9 F (38.3 C) (03/27 1012) Pulse Rate:  [85-100] 86 (03/27 1145) Resp:  [11-20] 17 (03/27 1145) BP: (140-158)/(74-103) 158/78 (03/27 1130) SpO2:  [93 %-98 %] 93 % (03/27 1130) Weight:  [74.9 kg (165 lb 2 oz)] 74.9 kg (165 lb 2 oz) (03/27 1015)  Weight change:  Filed Weights   11/16/17 1514 11/16/17 1529 11/18/17 1015  Weight: 76.3 kg (168 lb 3.4 oz) 76.2 kg (168 lb) 74.9 kg (165 lb 2 oz)    Intake/Output: I/O last 3 completed shifts: In: 600 [P.O.:600] Out: 300 [Urine:300]   Intake/Output this shift:  No intake/output data recorded.  Physical Exam: General: NAD, laying in the bed  Head: [place]/AT  Eyes: Anicteric  Neck: supple  Lungs:  clear to auscultation, normal effort  Heart: Regular, no rubs  Abdomen:  Soft, nontender, BS present  Extremities: Rt AKA, with Rt thigh edema, left leg edema  Neurologic: Alert, able to follow commands     Access: RIJ Permcath 11/16/17    Basic Metabolic Panel: Recent Labs  Lab 11/12/17 0329 11/13/17 0334 11/14/17 0721 11/17/17 1107  NA 135 132* 136 136  K 4.2 4.8 4.7 5.5*  CL 101 98* 101 99*  CO2 GLUCOSE 94 90 88 94  BUN 32* 49* 30* 24*  CREATININE 4.52* 6.50* 5.24* 5.49*  CALCIUM 7.6* 7.6* 7.7* 7.9*  MG 2.0  --   --   --     Liver Function Tests: No results for input(s): AST, ALT, ALKPHOS, BILITOT, PROT, ALBUMIN in the last 168 hours. No results for input(s): LIPASE, AMYLASE in the last 168 hours. No results for input(s): AMMONIA in the last 168 hours.  CBC: Recent Labs  Lab 11/12/17 0329  11/13/17 0334 11/13/17 1923 11/14/17 0721 11/14/17 1327 11/16/17 1545  WBC 16.3*  --  17.0*   --  18.7*  --  13.2*  HGB 5.7*   < > 7.1* 7.3* 7.3* 7.4* 7.5*  HCT 16.6*   < > 21.2* 22.0* 21.9* 22.1* 22.3*  MCV 87.1  --  88.1  --  88.7  --  89.2  PLT 389  --  411  --  441*  --  484*   < > = values in this interval not displayed.    Cardiac Enzymes: No results for input(s): CKTOTAL, CKMB, CKMBINDEX, TROPONINI in the last 168 hours.  BNP: Invalid input(s): POCBNP  CBG: No results for input(s): GLUCAP in the last 168 hours.  Microbiology: Results for orders placed or performed during the hospital encounter of 10/30/17  MRSA PCR Screening     Status: None   Collection Time: 10/30/17  7:54 PM  Result Value Ref Range Status   MRSA by PCR NEGATIVE NEGATIVE Final    Comment:        The GeneXpert MRSA Assay (FDA approved for NASAL specimens only), is one component of a comprehensive MRSA colonization surveillance program. It is not intended to diagnose MRSA infection nor to guide or monitor treatment for MRSA infections. Performed at [facility], [address] [place], [place] [address]   Culture, respiratory (NON-Expectorated)     Status: None   Collection Time:  11/05/17  8:17 AM  Result Value Ref Range Status   Specimen Description   Final    TRACHEAL ASPIRATE Performed at [facility], [address] [place], [place] [address]    Special Requests   Final    NONE Performed at [facility], [address] [place], [place] [address]    Gram Stain   Final    FEW WBC PRESENT, PREDOMINANTLY PMN MODERATE GRAM NEGATIVE RODS RARE YEAST Performed at [facility], [address], [place], [place] [address]    Culture MODERATE ENTEROBACTER CLOACAE  Final   Report Status 11/08/2017 FINAL  Final   Organism ID, Bacteria ENTEROBACTER CLOACAE  Final      Susceptibility   Enterobacter cloacae - MIC*    CEFAZOLIN >=64 RESISTANT Resistant     CEFEPIME <=1 SENSITIVE Sensitive     CEFTAZIDIME <=1 SENSITIVE Sensitive     CEFTRIAXONE <=1  SENSITIVE Sensitive     CIPROFLOXACIN <=0.25 SENSITIVE Sensitive     GENTAMICIN <=1 SENSITIVE Sensitive     IMIPENEM <=0.25 SENSITIVE Sensitive     TRIMETH/SULFA <=20 SENSITIVE Sensitive     PIP/TAZO <=4 SENSITIVE Sensitive     * MODERATE ENTEROBACTER CLOACAE  Culture, blood (routine x 2)     Status: None   Collection Time: 11/09/17  8:49 PM  Result Value Ref Range Status   Specimen Description BLOOD DIALYSIS  Final   Special Requests   Final    BOTTLES DRAWN AEROBIC AND ANAEROBIC Blood Culture adequate volume   Culture   Final    NO GROWTH 5 DAYS Performed at [facility], [address], [place], [place] [address]    Report Status 11/14/2017 FINAL  Final    Coagulation Studies: No results for input(s): LABPROT, INR in the last 72 hours.  Urinalysis: No results for input(s): COLORURINE, LABSPEC, PHURINE, GLUCOSEU, HGBUR, BILIRUBINUR, KETONESUR, PROTEINUR, UROBILINOGEN, NITRITE, LEUKOCYTESUR in the last 72 hours.  Invalid input(s): APPERANCEUR    Imaging: Mr Cervical Spine Wo Contrast  Result Date: 11/16/2017 CLINICAL DATA:  27 y/o M; impaired pinprick and light touch below the neck worse on the left. EXAM: MRI CERVICAL SPINE WITHOUT CONTRAST TECHNIQUE: Multiplanar, multisequence MR imaging of the cervical spine was performed. No intravenous contrast was administered. COMPARISON:  03/15/2017 neck CT. FINDINGS: Alignment: Physiologic. Vertebrae: No fracture, evidence of discitis, or bone lesion. Cord: Normal signal and morphology. Posterior Fossa, vertebral arteries, paraspinal tissues: Negative. Disc levels: C2-3: No significant disc displacement, foraminal stenosis, or canal stenosis. C3-4: No significant disc displacement, foraminal stenosis, or canal stenosis. C4-5: No significant disc displacement, foraminal stenosis, or canal stenosis. C5-6: Mild discogenic degenerative changes with loss of disc space height and a small disc bulge effacing the ventral thecal sac with  small central annular fissure. No significant foraminal or canal stenosis. C6-7: No significant disc displacement, foraminal stenosis, or canal stenosis. C7-T1: No significant disc displacement, foraminal stenosis, or canal stenosis. IMPRESSION: 1. No acute osseous or cord signal abnormality. 2. Mild C5-6 discogenic degenerative changes with a small disc bulge and central annular fissure effacing ventral thecal sac. No cord compression, foraminal stenosis, or canal stenosis. Electronically Signed   By: Mitzi Hansen M.D.   On: 11/16/2017 22:49     Medications:    . feeding supplement (ENSURE ENLIVE)  237 mL Oral TID BM  . ferrous sulfate  325 mg Oral BID WC  . gabapentin  100 mg Oral TID  . multivitamin  1 tablet  Oral QHS  . multivitamin with minerals  1 tablet Oral Once per day on Mon Wed Fri  . nicotine  21 mg Transdermal Daily  . oxyCODONE  80 mg Oral Q12H  . QUEtiapine  25 mg Oral QHS  . senna  2 tablet Oral QHS  . sodium chloride flush  10-40 mL Intracatheter Q12H  . vitamin C  250 mg Oral BID   acetaminophen, ALPRAZolam, bisacodyl, dextrose, morphine injection, nystatin cream, sennosides, traMADol  Assessment/ Plan:  [name] is a 27 y.o. white male with asthma, bronchitis, polysubstance abuse, was admitted on3/8/2019after he was found down in a hotel for an unknown period of time in the fetal position. Hospital course includes prolonged CPR for 45 minutes in the emergency room. Ventricular fibrillation arrest, hypothermia protocol.  Urine tox screen positive for cocaine and opiates.  Hospital course complicated by acute respiratory failure and acute renal failure. Started on CRRT 3/8. Underwent disarticulation of right lower extremity due to ischemic limb. Rhabdomyolysis with sustained CPK greater than 50,000 for several days.Self extubated on 3/15. Transitioned to intermittent hemodialysis on 3/16.   1.  End stage renal disease:  Cause Acute renal failure  secondary to ATN, shock, hypotension and rhabdomyolysis, no recovery.    -At this point it appears that the patient has end-stage renal disease.  His BUN and creatinine remain elevated.  He also has hyperkalemia.  We will begin placement efforts for outpatient hemodialysis.  2. Rhabdomyolysis: cocaine induced rhabdomyolysis. CK has trended down.   3.  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 Gilda Crease on 3/12.   Underwent revision AKA 3/19 -Pain management per hospitalist.   4. Scrotal edema Conservative treatment Urology evaluation 3/21 Dr Sherryl Barters  5. LE edema -Ultrafiltration target 1.5 kg today.  6.  Anemia of CKD.   Hemoglobin down to 7.5.  We will start the patient on Aranesp 100 mcg subcutaneous weekly.    LOS: 19 Odell Choung 3/27/201911:51 AM

## 2017-11-18 NOTE — Progress Notes (Signed)
OT Cancellation Note  Patient Details Name: Joshua HubertMark A Bueso MRN: [id number] DOB: 05/01/1991   Cancelled Treatment:    Reason Eval/Treat Not Completed: Patient at procedure or test/ unavailable. Pt out of room for dialysis. Of note, no new K+ labs, yesterday K+ elevated 5.5 (outside parameters for therapy). Will continue to monitor and re-attempt OT treatment at later date/time as pt is medically appropriate.    Richrd PrimeJamie Stiller, MPH, MS, OTR/L ascom [phone] 11/18/17, 10:38 AM

## 2017-11-18 NOTE — Progress Notes (Signed)
Post HD Assessment   11/18/17 1400  Neurological  Level of Consciousness Alert  Orientation Level Oriented X4  Respiratory  Respiratory Pattern Regular  Chest Assessment Chest expansion symmetrical  Bilateral Breath Sounds Diminished  Sputum Amount None  Vascular  R Radial Pulse +2  L Radial Pulse +2  Edema Generalized  Generalized Edema +2  Psychosocial  Psychosocial (WDL) WDL

## 2017-11-18 NOTE — Progress Notes (Signed)
Physical Therapy Treatment Patient Details Name: Joshua [name] Joshua [name] MRN: [id number] DOB: 06/05/1991 Today's Date: 11/18/2017    History of Present Illness presented to ER after being found unresponsive, down for unknown period of time, related to ingestion of controlled substances.  Patient suffered vfib arrest en route and underwent 45 minutes CPR; subsequently transitioned to hypothermia cooling protocol.  Remained intubated and sedated until self-extubation 3/15. UDS positive for cocaine and opiates.  Hospital course additionally significant for severe rhabomyolisis with ischemic R LE (status post emergent knee disarticulation 3/12 with revision to R AKA 3/15); initiation of CRRT due to renal failure, now transitioned to temporary dialysis via R IJ, scheduled for conversion to permcath with HD next date.       PT Comments    Checked with MD prior to session due to labs and received OK to treat. Pt at dialysis today and generally fatigued but motivated to attempt.  Requested pain meds prior to session for 10/10 residual limb/stump pain.  To edge of bed with rail and min guard. He was able to stand with min Joshua x 1.  Pt pulling up on walker despite re-direction to push from bed.  Once standing he was able to increase overall gait distance this session.  Pt with hop-to gait with dec step length and height.  General fatigue increased with distance and gait quality decreased with distance.  Upon return to bed pt required min Joshua x 2 due to general unsteadiness.  Pt tried to reach for bed rail prior to fully turing and approaching bed and needed mod verbal cues to return hand to walker to complete turn safely.  Initially only required +1 assist but as gait quality decreased needed +2 but pt motivated to continue to walk stating "Let me try it"  Returned to bed and declined further interventions at this time.   Follow Up Recommendations  CIR     Equipment Recommendations  Rolling walker with 5" wheels     Recommendations for Other Services       Precautions / Restrictions Precautions Precautions: Fall Precaution Comments: pressure area to L heel, R IJ temp dialysis catheter Restrictions Weight Bearing Restrictions: Yes RLE Weight Bearing: Non weight bearing    Mobility  Bed Mobility Overal bed mobility: Needs Assistance Bed Mobility: Supine to Sit;Sit to Supine     Supine to sit: Min guard Sit to supine: Min guard      Transfers Overall transfer level: Needs assistance Equipment used: Rolling walker (2 wheeled) Transfers: Sit to/from Stand Sit to Stand: Min assist         General transfer comment: poor hand placement, pulling up on walker despite cues  Ambulation/Gait Ambulation/Gait assistance: Min assist;Mod assist Ambulation Distance (Feet): 30 Feet Assistive device: Rolling walker (2 wheeled) Gait Pattern/deviations: Step-to pattern   Gait velocity interpretation: Below normal speed for age/gender General Gait Details: hop to gait pattern with limited L LE clearance; tends to take small, stuttering hops.  Poor balance, poor functional endurance for gait activities.   Stairs            Wheelchair Mobility    Modified Rankin (Stroke Patients Only)       Balance Overall balance assessment: Needs assistance Sitting-balance support: No upper extremity supported;Feet supported Sitting balance-Leahy Scale: Good     Standing balance support: Bilateral upper extremity supported Standing balance-Leahy Scale: Fair Standing balance comment: requires rw at all times, fatigues quickly  Cognition Arousal/Alertness: Awake/alert Behavior During Therapy: WFL for tasks assessed/performed Overall Cognitive Status: History of cognitive impairments - at baseline                                        Exercises      General Comments        Pertinent Vitals/Pain Pain Assessment: 0-10 Pain Score:  10-Worst pain ever Pain Descriptors / Indicators: Sore;Aching Pain Intervention(s): Premedicated before session    Home Living                      Prior Function            PT Goals (current goals can now be found in the care plan section) Progress towards PT goals: Progressing toward goals    Frequency    7X/week      PT Plan Current plan remains appropriate    Co-evaluation              AM-PAC PT "6 Clicks" Daily Activity  Outcome Measure  Difficulty turning over in bed (including adjusting bedclothes, sheets and blankets)?: Joshua Little Difficulty moving from lying on back to sitting on the side of the bed? : Joshua Little Difficulty sitting down on and standing up from Joshua chair with arms (e.g., wheelchair, bedside commode, etc,.)?: Joshua Little Help needed moving to and from Joshua bed to chair (including Joshua wheelchair)?: Joshua Lot Help needed walking in hospital room?: Joshua Lot Help needed climbing 3-5 steps with Joshua railing? : Total 6 Click Score: 14    End of Session Equipment Utilized During Treatment: Gait belt Activity Tolerance: Patient tolerated treatment well;Patient limited by fatigue Patient left: in bed;with bed alarm set;with call bell/phone within reach Nurse Communication: Patient requests pain meds       Time: 1610-9604 PT Time Calculation (min) (ACUTE ONLY): 14 min  Charges:  $Gait Training: 8-22 mins                    G Codes:       Danielle Dess, PTA 11/18/17, 4:26 PM

## 2017-11-18 NOTE — Progress Notes (Signed)
[facility] [place] [facility]   PATIENT NAME: Joshua [name]    MR#:  [id number]  DATE OF BIRTH:  05/25/1991  SUBJECTIVE:   She is seen in hemodialysis.  Had some urinary retention but improved with in and out cath.  Denies any other issues overnight.  Still complaining of generalized aches and pains all over.  REVIEW OF SYSTEMS:    Review of Systems  Constitutional: Negative for chills and fever.  HENT: Negative for congestion and tinnitus.   Eyes: Negative for blurred vision and double vision.  Respiratory: Negative for cough, shortness of breath and wheezing.   Cardiovascular: Negative for chest pain, orthopnea and PND.  Gastrointestinal: Negative for abdominal pain, diarrhea, nausea and vomiting.  Genitourinary: Negative for dysuria and hematuria.  Neurological: Negative for dizziness, sensory change and focal weakness.  All other systems reviewed and are negative.   Nutrition: Regular Tolerating Diet: yes Tolerating PT: Await Eval.    DRUG ALLERGIES:  No Known Allergies  VITALS:  Blood pressure (!) 176/105, pulse (!) 103, temperature 99.7 F (37.6 C), temperature source Oral, resp. rate 20, height 5\' 10"  (1.778 m), weight 74.9 kg (165 lb 2 oz), SpO2 90 %.  PHYSICAL EXAMINATION:   Physical Exam  GENERAL:  27 y.o.-year-old patient lying in bed in no acute distress.  EYES: Pupils equal, round, reactive to light and accommodation. No scleral icterus. Extraocular muscles intact.  HEENT: Head atraumatic, normocephalic. Oropharynx and nasopharynx clear.  NECK:  Supple, no jugular venous distention. No thyroid enlargement, no tenderness.  LUNGS: Normal breath sounds bilaterally, no wheezing, rales, rhonchi. No use of accessory muscles of respiration.  CARDIOVASCULAR: S1, S2 normal. No murmurs, rubs, or gallops.  ABDOMEN: Soft, nontender, nondistended. Bowel sounds present. No organomegaly or mass.  EXTREMITIES: No cyanosis, clubbing or edema b/l.   S/p  Right AKA NEUROLOGIC: Cranial nerves II through XII are intact. No focal Motor or sensory deficits b/l.  Globally weak.  PSYCHIATRIC: The patient is alert and oriented x 3.  SKIN: No obvious rash, lesion, or ulcer.  GU - + Scrotal Edema.   Right chest wall PermCath in place with no acute bleeding or drainage noted.  LABORATORY PANEL:   CBC Recent Labs  Lab 11/16/17 1545  WBC 13.2*  HGB 7.5*  HCT 22.3*  PLT 484*   ------------------------------------------------------------------------------------------------------------------  Chemistries  Recent Labs  Lab 11/12/17 0329  11/17/17 1107  NA 135   < > 136  K 4.2   < > 5.5*  CL 101   < > 99*  CO2 28   < > 28  GLUCOSE 94   < > 94  BUN 32*   < > 24*  CREATININE 4.52*   < > 5.49*  CALCIUM 7.6*   < > 7.9*  MG 2.0  --   --    < > = values in this interval not displayed.   ------------------------------------------------------------------------------------------------------------------  Cardiac Enzymes No results for input(s): TROPONINI in the last 168 hours. ------------------------------------------------------------------------------------------------------------------  RADIOLOGY:  Mr Cervical Spine Wo Contrast  Result Date: 11/16/2017 CLINICAL DATA:  27 y/o M; impaired pinprick and light touch below the neck worse on the left. EXAM: MRI CERVICAL SPINE WITHOUT CONTRAST TECHNIQUE: Multiplanar, multisequence MR imaging of the cervical spine was performed. No intravenous contrast was administered. COMPARISON:  03/15/2017 neck CT. FINDINGS: Alignment: Physiologic. Vertebrae: No fracture, evidence of discitis, or bone lesion. Cord: Normal signal and morphology. Posterior Fossa, vertebral arteries, paraspinal tissues: Negative. Disc levels: C2-3:  No significant disc displacement, foraminal stenosis, or canal stenosis. C3-4: No significant disc displacement, foraminal stenosis, or canal stenosis. C4-5: No significant disc  displacement, foraminal stenosis, or canal stenosis. C5-6: Mild discogenic degenerative changes with loss of disc space height and a small disc bulge effacing the ventral thecal sac with small central annular fissure. No significant foraminal or canal stenosis. C6-7: No significant disc displacement, foraminal stenosis, or canal stenosis. C7-T1: No significant disc displacement, foraminal stenosis, or canal stenosis. IMPRESSION: 1. No acute osseous or cord signal abnormality. 2. Mild C5-6 discogenic degenerative changes with a small disc bulge and central annular fissure effacing ventral thecal sac. No cord compression, foraminal stenosis, or canal stenosis. Electronically Signed   By: Mitzi Hansen M.D.   On: 11/16/2017 22:49     ASSESSMENT AND PLAN:   27 year old male with known history of asthma, tobacco abuse, IVDA presents to hospital secondary to an unresponsive state.  1. Acutecardiac arrest - Most likely secondary to polysubstance overdose - resolved and pt. Presently stable.   2. Acuteend-stage renal disease  - Secondary to cocaine induced rhabdomyolysis  -Status post PermCath placement now and Cont. Dialysis as per Nephrology.  -As per nephrology patient is now ESRD.  Social work to work on getting outpatient spot for HD.  3. Rhabdomyolysis, acute - Resolved, Secondary topossible ischemic right leg with compartment syndrome. - s/p right leg disarticulation at the [facility] 3/12/19followed by revision with AKA on November 11, 2017, patient recovering well  4. Sepsis, acute Resolved Treated with course of IV Unasyn/vancomycin  5. Hyperkalemia - due to acute on chronic renal failure.  - cont. To follow with HD.    6. 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   Compounded by polysubstance abuse. Stable. No SI/HI.    8.Acute on chronic pain syndrome Compounded by right AKA - cont. Oxycontin, Morphine, Neurontin.   9.Acute on chronic anemia  Improved S/P 2 units PRBCs - follow Hg. And Transfuse if < 7.    10.  Acute Enterobacter infection - finished treatment with Cipro.     All the records are reviewed and case discussed with Care Management/Social Worker. Management plans discussed with the patient, family and they are in agreement.  CODE STATUS: Full code  DVT Prophylaxis: Ted's & SCD's  TOTAL TIME TAKING CARE OF THIS PATIENT: 25 minutes.   POSSIBLE D/C unclear and depends of pt. Getting outpatient spot for HD.    Houston Siren M.D on 11/18/2017 at 2:51 PM  Between 7am to 6pm - Pager - [phone]  After 6pm go to [email or web address] - Social research officer, government  [facility]  Office  [phone]  CC: Primary care physician; Patient, No Pcp Per

## 2017-11-18 NOTE — Care Management (Signed)
Joshua [name] with inpatient rehab that notified that once outpatient HD is established attending has stated that patient will be medically ready for discharge.  Per [name] HD liaison she is hopeful that she will receive confirmation today.  Joshua [name] notified.

## 2017-11-18 NOTE — Progress Notes (Signed)
Pre HD Assessment    11/18/17 1010  Neurological  Level of Consciousness Alert  Orientation Level Oriented X4  Respiratory  Respiratory Pattern Regular  Chest Assessment Chest expansion symmetrical  Bilateral Breath Sounds Diminished  Sputum Amount None  Vascular  R Radial Pulse +2  L Radial Pulse +2  Edema Generalized  Generalized Edema +2  Psychosocial  Psychosocial (WDL) WDL

## 2017-11-18 NOTE — Progress Notes (Signed)
This note also relates to the following rows which could not be included: Pulse Rate - Cannot attach notes to unvalidated device data Resp - Cannot attach notes to unvalidated device data BP - Cannot attach notes to unvalidated device data  Hd started  

## 2017-11-18 NOTE — Progress Notes (Signed)
PT Cancellation Note  Patient Details Name: Joshua [name] MRN: [id number] DOB: 08/26/1990   Cancelled Treatment:    Reason Eval/Treat Not Completed: Patient at procedure or test/unavailable(Patient currently off unit for dialysis.  Will re-attempt at later time/date as medically appropriate and available.)   Shruti Arrey H. Manson PasseyBrown, PT, DPT, NCS 11/18/17, 10:44 AM [phone]

## 2017-11-18 NOTE — Progress Notes (Signed)
HD Tx Ended   11/18/17 1345  Vital Signs  Pulse Rate 92  Resp 10  BP 139/90  Oxygen Therapy  SpO2 99 %  During Hemodialysis Assessment  Blood Flow Rate (mL/min) 350 mL/min  Arterial Pressure (mmHg) -120 mmHg  Venous Pressure (mmHg) 170 mmHg  Transmembrane Pressure (mmHg) 60 mmHg  Ultrafiltration Rate (mL/min) 580 mL/min  Dialysate Flow Rate (mL/min) 800 ml/min  Conductivity: Machine  14  HD Safety Checks Performed Yes  Dialysis Fluid Bolus Normal Saline  Bolus Amount (mL) 250 mL  Intra-Hemodialysis Comments Tx completed;Tolerated well

## 2017-11-19 ENCOUNTER — Inpatient Hospital Stay: Payer: Self-pay

## 2017-11-19 MED ORDER — LEVALBUTEROL HCL 0.63 MG/3ML IN NEBU
0.6300 mg | INHALATION_SOLUTION | Freq: Four times a day (QID) | RESPIRATORY_TRACT | Status: DC | PRN
  Filled 2017-11-19 (×2): qty 3

## 2017-11-19 MED ORDER — IPRATROPIUM BROMIDE 0.02 % IN SOLN
0.5000 mg | Freq: Four times a day (QID) | RESPIRATORY_TRACT | Status: DC | PRN
  Administered 2017-11-19: 0.5 mg via RESPIRATORY_TRACT
  Filled 2017-11-19: qty 2.5

## 2017-11-19 NOTE — Progress Notes (Signed)
Physical Therapy Treatment Patient Details Name: Joshua [name] MRN: [id number] DOB: 01/04/1991 Today's Date: 11/19/2017    History of Present Illness 27yo male pt presented to ER after being found unresponsive, down for unknown period of time, related to ingestion of controlled substances.  Patient suffered vfib arrest en route and underwent 45 minutes CPR; subsequently transitioned to hypothermia cooling protocol.  Remained intubated and sedated until self-extubation 3/15. UDS positive for cocaine and opiates.  Hospital course additionally significant for severe rhabomyolisis with ischemic R LE (status post emergent knee disarticulation 3/12 with revision to R AKA 3/15); initiation of CRRT due to renal failure, now transitioned to temporary dialysis via R IJ, scheduled for conversion to permcath with HD next date.       PT Comments    Pt agreeable to PT; reports 10+ pain RLE residual limb. Pt participates in supine bed exercises and 1 resisted exercise LLE. Pt requires small rest breaks due to pain. Pt demonstrates increased effort and time with supine to sit to edge of bed and heavy use of rails, but able to demonstrate without physical assist as well as return to supine. Pt does not wish in chair at this time, but overall all good effort with exercise activity, as pt had just recently finished work with OT performing sit and standing transfers. Continue PT to progress strength, endurance to improve all functional mobility.    CIR     Equipment Recommendations  Rolling walker with 5" wheels    Recommendations for Other Services       Precautions / Restrictions Precautions Precautions: Fall Precaution Comments: pressure area to L heel, R IJ temp dialysis catheter Restrictions Weight Bearing Restrictions: Yes RLE Weight Bearing: Non weight bearing Other Position/Activity Restrictions: R AKA    Mobility  Bed Mobility Overal bed mobility: Modified Independent Bed Mobility: Supine to Sit     Supine to sit: Supervision Sit to supine: Supervision  Supine to sit: Supervision    General bed mobility comments: Significant increased time/effort and heavy use of rail.   Transfers Overall transfer level: Needs assistance Equipment used: Rolling walker (2 wheeled) Transfers: Sit to/from Stand Sit to Stand: Min guard         General transfer comment: with initial instruction before attempting with emphasis on hand placement and importance of safety and bringing COG over BOS, pt able to perform with min verbal cues to push up from the side of the bed and CGA for balance   Ambulation/Gait                 Stairs            Wheelchair Mobility    Modified Rankin (Stroke Patients Only)       Balance Overall balance assessment: Needs assistance Sitting-balance support: Feet unsupported;Feet supported;No upper extremity supported Sitting balance-Leahy Scale: Good     Standing balance support: Bilateral upper extremity supported Standing balance-Leahy Scale: Fair Standing balance comment: requires rw at all times, fatigues quickly                            Cognition Arousal/Alertness: Awake/alert Behavior During Therapy: WFL for tasks assessed/performed Overall Cognitive Status: Within Functional Limits for tasks assessed Area of Impairment: Problem solving;Safety/judgement                         Safety/Judgement: Decreased awareness of safety;Decreased awareness of deficits  Problem Solving: Slow processing;Requires verbal cues General Comments: Pt A&Ox4, increased time for processing of new info and multi step commands, poor insight/safety awareness but with VC is able to improve attention      Exercises General Exercises - Lower Extremity Ankle Circles/Pumps: Other (comment)(encouraged on L ) Gluteal Sets: Strengthening;Both;10 reps;Supine(2 sets) Heel Slides: AROM;Strengthening;10 reps;Supine(2 sets; resisted ext) Hip  ABduction/ADduction: AROM;Right;10 reps;Supine(2 sets) Straight Leg Raises: AROM;Right;10 reps;Supine(2 sets) Other Exercises Other Exercises: Ham/glut set 10x Other Exercises: Encouraged pursed lip breathing for improved O2 saturation    General Comments General comments (skin integrity, edema, etc.): Seated EOB at start of session pt HR 119, O2 on RA 91%. During mobility HR up to 129, O2 sats on RA 93-94%. At end of session after some deep breathing exercises and therapeutic rest break, HR 112, O2 95%.       Pertinent Vitals/Pain Pain Assessment: 0-10 Pain Score: 10-Worst pain ever(10+) Faces Pain Scale: Hurts even more Pain Location: R residual limb, scrotum Pain Descriptors / Indicators: Burning;Constant Pain Intervention(s): Limited activity within patient's tolerance;Monitored during session;Repositioned    Home Living                      Prior Function            PT Goals (current goals can now be found in the care plan section) Acute Rehab PT Goals Patient Stated Goal: To be able to do things for himself again Progress towards PT goals: Progressing toward goals    Frequency    7X/week      PT Plan Current plan remains appropriate    Co-evaluation              AM-PAC PT "6 Clicks" Daily Activity  Outcome Measure  Difficulty turning over in bed (including adjusting bedclothes, sheets and blankets)?: A Little Difficulty moving from lying on back to sitting on the side of the bed? : A Little Difficulty sitting down on and standing up from a chair with arms (e.g., wheelchair, bedside commode, etc,.)?: Unable Help needed moving to and from a bed to chair (including a wheelchair)?: A Lot Help needed walking in hospital room?: A Lot Help needed climbing 3-5 steps with a railing? : Total 6 Click Score: 12    End of Session   Activity Tolerance: Patient limited by pain Patient left: in bed;with bed alarm set;with call bell/phone within reach   PT  Visit Diagnosis: Difficulty in walking, not elsewhere classified (R26.2);Muscle weakness (generalized) (M62.81)     Time: 1610-9604 PT Time Calculation (min) (ACUTE ONLY): 28 min  Charges:  $Therapeutic Exercise: 8-22 mins $Therapeutic Activity: 8-22 mins                    G CodesScot Dock, PTA 11/19/2017, 4:33 PM

## 2017-11-19 NOTE — Progress Notes (Signed)
Occupational Therapy Treatment Patient Details Name: Joshua [name] MRN: [id number] DOB: 07/20/1991 Today's Date: 11/19/2017    History of present illness 26yo male pt presented to ER after being found unresponsive, down for unknown period of time, related to ingestion of controlled substances.  Patient suffered vfib arrest en route and underwent 45 minutes CPR; subsequently transitioned to hypothermia cooling protocol.  Remained intubated and sedated until self-extubation 3/15. UDS positive for cocaine and opiates.  Hospital course additionally significant for severe rhabomyolisis with ischemic R LE (status post emergent knee disarticulation 3/12 with revision to R AKA 3/15); initiation of CRRT due to renal failure, now transitioned to temporary dialysis via R IJ, scheduled for conversion to permcath with HD next date.      OT comments  Pt making good progress towards goals. Pt eager to work with therapist this afternoon, received seated EOB with RN upon entry into room. Pt educated in body mechanics and hand placement for safety during functional transfers to support recall and carryover, bed mobility strategies to help improve comfort and pain relief, and occasional verbal cues required for safety with divided attention during grooming tasks while standing at the sink. Pt tolerated standing for 4 minutes with UE or BUE support on the counter with no LOB, CGA t/o for safety, to brush his teeth, rinse and comb his hair, wash his face and hands. Seated EOB at start of session, O2 91%, HR 119. Pt instructed in pursed lip breathing to improve O2 sats and decrease HR with pt able to return demo with min verbal cues during session. With exertional activity in standing, HR increased to 129, O2 sats improving to 94-95%, with HR decreasing to 112 within approx 2 minutes with seated rest break and pursed lip breathing. Pt educated in activity tolerance and need for rest breaks while his activity tolerance is low. Pt  demonstrating good progress towards OT goals. Will continue to progress. Remains most appropriate for CIR following hospitalization.    Follow Up Recommendations  CIR    Equipment Recommendations  Other (comment)(TBD)    Recommendations for Other Services      Precautions / Restrictions Precautions Precautions: Fall Precaution Comments: pressure area to L heel, R IJ temp dialysis catheter Restrictions Weight Bearing Restrictions: Yes RLE Weight Bearing: Non weight bearing Other Position/Activity Restrictions: R AKA       Mobility Bed Mobility   Bed Mobility: Supine to Sit;Sit to Supine     Supine to sit: Supervision Sit to supine: Supervision   General bed mobility comments: additional time/effort to perform  Transfers Overall transfer level: Needs assistance Equipment used: Rolling walker (2 wheeled) Transfers: Sit to/from Stand Sit to Stand: Min guard         General transfer comment: with initial instruction before attempting with emphasis on hand placement and importance of safety and bringing COG over BOS, pt able to perform with min verbal cues to push up from the side of the bed and CGA for balance     Balance Overall balance assessment: Needs assistance Sitting-balance support: Feet unsupported;Feet supported;No upper extremity supported Sitting balance-Leahy Scale: Good     Standing balance support: Bilateral upper extremity supported Standing balance-Leahy Scale: Fair Standing balance comment: requires rw at all times, fatigues quickly                           ADL either performed or assessed with clinical judgement   ADL Overall ADL's :  Needs assistance/impaired Eating/Feeding: Bed level;Independent   Grooming: Standing;Min guard;Minimal assistance;Wash/dry face;Wash/dry hands;Oral care;Brushing hair Grooming Details (indicate cue type and reason): pt tolerated standing at counter to wash hands, face, brush teeth, and rinse and comb  his hair with CGA-min assist for balance with verbal cues for monitoring activity tolerance and attending to physical cues that he may be becoming fatigued. Pt stood with at least 1UE support on counter, sometimes resting forearms on counter for support.                                      Vision Baseline Vision/History: No visual deficits Patient Visual Report: No change from baseline     Perception     Praxis      Cognition Arousal/Alertness: Awake/alert Behavior During Therapy: WFL for tasks assessed/performed Overall Cognitive Status: Impaired/Different from baseline Area of Impairment: Problem solving;Safety/judgement                         Safety/Judgement: Decreased awareness of safety;Decreased awareness of deficits   Problem Solving: Slow processing;Requires verbal cues General Comments: Pt A&Ox4, increased time for processing of new info and multi step commands, poor insight/safety awareness but with VC is able to improve attention        Exercises Other Exercises Other Exercises: Pt instructed in pursed lip breathing technique to improve breathing after exertional activity with pt able to demo carryover with minimal verbal cues to implement t/o session Other Exercises: Pt instructed in safety and hand placement for transfers and for standing at the sink for grooming tasks to minimize his falls risk and also ensure stability while performing. Pt able to stand at sink with UE/BUE support on counter and CGA for approx 4 minutes with mild LLE shaking noted and verbal cues required for awareness of deficits and need to rest.   Shoulder Instructions       General Comments Seated EOB at start of session pt HR 119, O2 on RA 91%. During mobility HR up to 129, O2 sats on RA 93-94%. At end of session after some deep breathing exercises and therapeutic rest break, HR 112, O2 95%.     Pertinent Vitals/ Pain       Pain Assessment: 0-10 Faces Pain Scale:  Hurts even more Pain Location: R residual limb, scrotum Pain Descriptors / Indicators: Sore;Aching Pain Intervention(s): Limited activity within patient's tolerance;Monitored during session;Repositioned  Home Living                                          Prior Functioning/Environment              Frequency  Min 2X/week        Progress Toward Goals  OT Goals(current goals can now be found in the care plan section)  Progress towards OT goals: Progressing toward goals  Acute Rehab OT Goals Patient Stated Goal: To be able to do things for himself again Time For Goal Achievement: 11/28/17  Plan Discharge plan remains appropriate;Frequency remains appropriate    Co-evaluation                 AM-PAC PT "6 Clicks" Daily Activity     Outcome Measure   Help from another person eating meals?: None Help from another person  taking care of personal grooming?: A Little Help from another person toileting, which includes using toliet, bedpan, or urinal?: A Little Help from another person bathing (including washing, rinsing, drying)?: A Lot Help from another person to put on and taking off regular upper body clothing?: A Little Help from another person to put on and taking off regular lower body clothing?: A Lot 6 Click Score: 17    End of Session Equipment Utilized During Treatment: Gait belt;Rolling walker  OT Visit Diagnosis: Muscle weakness (generalized) (M62.81);Unsteadiness on feet (R26.81);Pain   Activity Tolerance Patient tolerated treatment well   Patient Left in bed;with call bell/phone within reach;with bed alarm set   Nurse Communication          Time: 1610-9604 OT Time Calculation (min): 44 min  Charges: OT General Charges $OT Visit: 1 Visit OT Treatments $Self Care/Home Management : 38-52 mins  Richrd Prime, MPH, MS, OTR/L ascom [phone] 11/19/17, 4:07 PM

## 2017-11-19 NOTE — Progress Notes (Signed)
 inpatient rehab admissions - I have been following progress for potential acute inpatient rehab admission.  Noted that outpatient HD placement is pending.  Once this is established, then we can plan for inpatient rehab admission.  Call me for questions.  #[phone]
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
## 2017-11-19 NOTE — Care Management (Signed)
Still awaiting outpatient dialysis clinic placement. Joshua ChyleAmanda [name] with [facility] reports that [facility] medical [facility] denied stating patient not medically stable and verbalized concerns regarding items in the OT and PT notes.  Being sent for secondary medical review.  Alternate plan [place] w [place].
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
## 2017-11-19 NOTE — Progress Notes (Signed)
MD notified of pt c/o slight shortness of breath. Orders for duonebs q6h prn. Also notified MD of pt c/o numbness in right numb. Doppler reveals very strong femoral and popliteal pulses. MD states swelling is likely the cause of numbness. Will continue to monitor.

## 2017-11-19 NOTE — Progress Notes (Signed)
Chaplain followed up with Pt. Chaplain practiced active listening and pastoral support. Pt talked about the disappointments in his life and the people who let him down. He talked about what he would change if he could. He talked about how love is shown and how he thought he lack receiving love. Pt was opening up about his feelings and hopes moving forward. Pt mother entered the room . Chaplain prayed for the Pt and family. Ch will follow up.    11/19/17 1400  Clinical Encounter Type  Visited With Patient  Visit Type Spiritual support  Referral From Chaplain  Spiritual Encounters  Spiritual Needs Prayer;Emotional

## 2017-11-19 NOTE — Progress Notes (Signed)
[facility]   PATIENT NAME: Joshua [name]    MR#:  [id number]  DATE OF BIRTH:  03/18/1991  SUBJECTIVE:   No acute events overnight. Pt. Complaining of non-specific back pain.    REVIEW OF SYSTEMS:    Review of Systems  Constitutional: Negative for chills and fever.  HENT: Negative for congestion and tinnitus.   Eyes: Negative for blurred vision and double vision.  Respiratory: Negative for cough, shortness of breath and wheezing.   Cardiovascular: Negative for chest pain, orthopnea and PND.  Gastrointestinal: Negative for abdominal pain, diarrhea, nausea and vomiting.  Genitourinary: Negative for dysuria and hematuria.  Musculoskeletal: Positive for back pain.  Neurological: Negative for dizziness, sensory change and focal weakness.  All other systems reviewed and are negative.   Nutrition: Regular Tolerating Diet: yes Tolerating PT: Eval noted.     DRUG ALLERGIES:  No Known Allergies  VITALS:  Blood pressure (!) 144/82, pulse (!) 105, temperature 99.6 F (37.6 C), temperature source Oral, resp. rate 20, height 5\' 10"  (1.778 m), weight 74.9 kg (165 lb 2 oz), SpO2 97 %.  PHYSICAL EXAMINATION:   Physical Exam  GENERAL:  27 y.o.-year-old patient lying in bed in no acute distress.  EYES: Pupils equal, round, reactive to light and accommodation. No scleral icterus. Extraocular muscles intact.  HEENT: Head atraumatic, normocephalic. Oropharynx and nasopharynx clear.  NECK:  Supple, no jugular venous distention. No thyroid enlargement, no tenderness.  LUNGS: Normal breath sounds bilaterally, no wheezing, rales, rhonchi. No use of accessory muscles of respiration.  CARDIOVASCULAR: S1, S2 normal. No murmurs, rubs, or gallops.  ABDOMEN: Soft, nontender, nondistended. Bowel sounds present. No organomegaly or mass.  EXTREMITIES: No cyanosis, clubbing or edema b/l.   S/p Right AKA NEUROLOGIC: Cranial nerves II through XII are intact. No focal  Motor or sensory deficits b/l.  Globally weak.  PSYCHIATRIC: The patient is alert and oriented x 3.  SKIN: No obvious rash, lesion, or ulcer.  GU - + Scrotal Edema.   Right chest wall PermCath in place with no acute bleeding or drainage noted.  LABORATORY PANEL:   CBC Recent Labs  Lab 11/16/17 1545  WBC 13.2*  HGB 7.5*  HCT 22.3*  PLT 484*   ------------------------------------------------------------------------------------------------------------------  Chemistries  Recent Labs  Lab 11/17/17 1107  NA 136  K 5.5*  CL 99*  CO2 28  GLUCOSE 94  BUN 24*  CREATININE 5.49*  CALCIUM 7.9*   ------------------------------------------------------------------------------------------------------------------  Cardiac Enzymes No results for input(s): TROPONINI in the last 168 hours. ------------------------------------------------------------------------------------------------------------------  RADIOLOGY:  No results found.   ASSESSMENT AND PLAN:   27 year old male with known history of asthma, tobacco abuse, IVDA presents to hospital secondary to an unresponsive state.  1. Acutecardiac arrest - Most likely secondary to polysubstance overdose - resolved and pt. Presently stable.   2. Acuteend-stage renal disease  - Secondary to cocaine induced rhabdomyolysis  -Status post PermCath placement now and Cont. Dialysis as per Nephrology.  -As per nephrology patient is now ESRD.  Working on outpatient spot for HD.    3. Rhabdomyolysis, acute - Resolved, Secondary topossible ischemic right leg with compartment syndrome. - s/p right leg disarticulation at the [facility] 3/12/19followed by revision with AKA on November 11, 2017, patient recovering well  4. Sepsis, acute Resolved Treated with course of IV Unasyn/vancomycin  5. Hyperkalemia - due to acute on chronic renal failure.  - cont. To follow with HD.    6. Acute right lower extremity  ischemia   s/pdisarticulation/right AKA by Dr.Schnier on 3/12,revised to right AKA on November 11, 2017 by vascular surgery - no acute issue and wound looks stable.   7.Acute suicidal thoughts - seen by Psych and no acute inpatient Psych needs presently.  Compounded by polysubstance abuse. Stable. No SI/HI.    8.Acute on chronic pain syndrome Compounded by right AKA - cont. Oxycontin, Morphine, Neurontin.   9.Acute on chronic anemia  Improved S/P 2 units PRBCs - follow Hg. And Transfuse if < 7.    10.  Acute Enterobacter infection - finished treatment with Cipro.     All the records are reviewed and case discussed with Care Management/Social Worker. Management plans discussed with the patient, family and they are in agreement.  CODE STATUS: Full code  DVT Prophylaxis: Ted's & SCD's  TOTAL TIME TAKING CARE OF THIS PATIENT: 25 minutes.   POSSIBLE D/C unclear and depends of pt. Getting outpatient spot for HD.    Houston SirenSAINANI,Kalik Hoare J M.D on 11/19/2017 at 12:55 PM  Between 7am to 6pm - Pager - [phone]  After 6pm go to [email or web address] - Social research officer, governmentpassword [email or web address] [email or web address]  [facility]  Office  [phone]  CC: Primary care physician; Patient, No Pcp Per

## 2017-11-20 LAB — CBC
HCT: 19.2 % — ABNORMAL LOW (ref 40.0–52.0)
HEMOGLOBIN: 6.2 g/dL — AB (ref 13.0–18.0)
MCH: 28.4 pg (ref 26.0–34.0)
MCHC: 32.2 g/dL (ref 32.0–36.0)
MCV: 88.2 fL (ref 80.0–100.0)
Platelets: 498 10*3/uL — ABNORMAL HIGH (ref 150–440)
RBC: 2.18 MIL/uL — ABNORMAL LOW (ref 4.40–5.90)
RDW: 14.5 % (ref 11.5–14.5)
WBC: 15.5 10*3/uL — AB (ref 3.8–10.6)

## 2017-11-20 LAB — BASIC METABOLIC PANEL
ANION GAP: 12 (ref 5–15)
BUN: 29 mg/dL — ABNORMAL HIGH (ref 6–20)
CHLORIDE: 98 mmol/L — AB (ref 101–111)
CO2: 26 mmol/L (ref 22–32)
Calcium: 7.9 mg/dL — ABNORMAL LOW (ref 8.9–10.3)
Creatinine, Ser: 6.55 mg/dL — ABNORMAL HIGH (ref 0.61–1.24)
GFR calc Af Amer: 12 mL/min — ABNORMAL LOW (ref >60)
GFR, EST NON AFRICAN AMERICAN: 10 mL/min — AB (ref >60)
GLUCOSE: 112 mg/dL — AB (ref 65–99)
POTASSIUM: 4.3 mmol/L (ref 3.5–5.1)
SODIUM: 136 mmol/L (ref 135–145)

## 2017-11-20 LAB — PHOSPHORUS: PHOSPHORUS: 4.2 mg/dL (ref 2.5–4.6)

## 2017-11-20 LAB — CK: CK TOTAL: 236 U/L (ref 49–397)

## 2017-11-20 LAB — PREPARE RBC (CROSSMATCH)

## 2017-11-20 MED ORDER — VANCOMYCIN HCL IN DEXTROSE 750-5 MG/150ML-% IV SOLN
750.0000 mg | Freq: Once | INTRAVENOUS | Status: AC
  Administered 2017-11-20: 750 mg via INTRAVENOUS
  Filled 2017-11-20: qty 150

## 2017-11-20 MED ORDER — SODIUM CHLORIDE 0.9 % IV SOLN: Freq: Once | INTRAVENOUS | Status: DC

## 2017-11-20 MED ORDER — NALOXONE HCL 0.4 MG/ML IJ SOLN
0.4000 mg | INTRAMUSCULAR | Status: DC | PRN
  Administered 2017-11-20: 0.4 mg via INTRAVENOUS
  Filled 2017-11-20: qty 1

## 2017-11-20 MED ORDER — MORPHINE SULFATE (PF) 2 MG/ML IV SOLN
2.0000 mg | Freq: Four times a day (QID) | INTRAVENOUS | Status: DC | PRN
  Administered 2017-11-20 – 2017-11-22 (×4): 2 mg via INTRAVENOUS
  Filled 2017-11-20 (×4): qty 1

## 2017-11-20 MED ORDER — VANCOMYCIN HCL IN DEXTROSE 1-5 GM/200ML-% IV SOLN
1000.0000 mg | Freq: Once | INTRAVENOUS | Status: AC
  Administered 2017-11-20: 1000 mg via INTRAVENOUS
  Filled 2017-11-20: qty 200

## 2017-11-20 MED ORDER — FUROSEMIDE 10 MG/ML IJ SOLN
40.0000 mg | INTRAMUSCULAR | Status: AC
  Administered 2017-11-20: 40 mg via INTRAVENOUS
  Filled 2017-11-20: qty 4

## 2017-11-20 MED ORDER — OXYCODONE HCL ER 15 MG PO T12A: 40.0000 mg | EXTENDED_RELEASE_TABLET | Freq: Two times a day (BID) | ORAL | Status: DC

## 2017-11-20 MED ORDER — OXYCODONE-ACETAMINOPHEN 5-325 MG PO TABS
1.0000 | ORAL_TABLET | ORAL | Status: DC | PRN
  Administered 2017-11-20 – 2017-11-23 (×11): 2 via ORAL
  Filled 2017-11-20 (×7): qty 2
  Filled 2017-11-20: qty 1
  Filled 2017-11-20 (×2): qty 2
  Filled 2017-11-20: qty 1
  Filled 2017-11-20: qty 2

## 2017-11-20 MED ORDER — PIPERACILLIN-TAZOBACTAM 3.375 G IVPB
3.3750 g | Freq: Two times a day (BID) | INTRAVENOUS | Status: DC
  Administered 2017-11-20 – 2017-11-24 (×8): 3.375 g via INTRAVENOUS
  Filled 2017-11-20 (×9): qty 50

## 2017-11-20 MED ORDER — VANCOMYCIN HCL IN DEXTROSE 750-5 MG/150ML-% IV SOLN
750.0000 mg | INTRAVENOUS | Status: DC
Start: 2017-11-20 — End: 2017-11-22
  Administered 2017-11-20: 750 mg via INTRAVENOUS
  Filled 2017-11-20: qty 150

## 2017-11-20 NOTE — Progress Notes (Signed)
HD tx end   11/20/17 1348  Vital Signs  Pulse Rate (!) 104  Pulse Rate Source Monitor  Resp 17  BP (!) 169/101  BP Location Right Arm  BP Method Automatic  Patient Position (if appropriate) Lying  Oxygen Therapy  SpO2 99 %  O2 Device Nasal Cannula  O2 Flow Rate (L/min) 2 L/min  During Hemodialysis Assessment  Dialysis Fluid Bolus Normal Saline  Bolus Amount (mL) 250 mL  Intra-Hemodialysis Comments Tx completed

## 2017-11-20 NOTE — Progress Notes (Signed)
Post HD assessment. Pt tolerated tx well, without c/o or complications. Pt received 1 unit RBCs during tx with no adverse reactions. Net UF 2031, goal met.    11/20/17 1358  Vital Signs  Temp 99.5 F (37.5 C)  Temp Source Oral  Pulse Rate (!) 105  Pulse Rate Source Monitor  Resp 12  BP (!) 173/108  BP Location Right Arm  BP Method Automatic  Patient Position (if appropriate) Lying  Oxygen Therapy  SpO2 100 %  O2 Device Nasal Cannula  O2 Flow Rate (L/min) 2 L/min  Dialysis Weight  Weight 72.7 kg (160 lb 4.4 oz)  Type of Weight Post-Dialysis  Post-Hemodialysis Assessment  Rinseback Volume (mL) 250 mL  KECN 77 V  Dialyzer Clearance Lightly streaked  Duration of HD Treatment -hour(s) 3.5 hour(s)  Hemodialysis Intake (mL) 870 mL  UF Total -Machine (mL) 2901 mL  Net UF (mL) 2031 mL  Tolerated HD Treatment Yes  Education / Care Plan  Dialysis Education Provided Yes  Documented Education in Care Plan Yes  Hemodialysis Catheter Right Subclavian Double-lumen  Placement Date/Time: 11/16/17 1655   Time Out: Correct patient;Correct site;Correct procedure  Maximum sterile barrier precautions: Hand hygiene;Sterile gloves;Cap;Large sterile sheet;Mask;Sterile gown  Site Prep: Chlorhexidine;Betadine  Local Anesthe...  Site Condition No complications  Blue Lumen Status Heparin locked  Red Lumen Status Heparin locked  Purple Lumen Status N/A  Catheter fill solution Heparin 1000 units/ml  Catheter fill volume (Arterial) 1.7 cc  Catheter fill volume (Venous) 1.7  Dressing Type Biopatch  Dressing Status Clean;Dry;Intact  Drainage Description None  Dressing Change Due 11/25/17  Post treatment catheter status Capped and Clamped
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
## 2017-11-20 NOTE — Progress Notes (Signed)
Patient left for HD earlier

## 2017-11-20 NOTE — Progress Notes (Signed)
[facility]  ROUNDING NOTE   Subjective:  Patient seen and evaluated during hemodialysis. Appears to be tolerating well.   Objective:  Vital signs in last 24 hours:  Temp:  [99.2 F (37.3 C)-102.7 F (39.3 C)] 99.5 F (37.5 C) (03/29 0949) Pulse Rate:  [92-131] 100 (03/29 1100) Resp:  [13-24] 16 (03/29 1100) BP: (133-169)/(77-108) 157/98 (03/29 1100) SpO2:  [91 %-100 %] 97 % (03/29 1100) Weight:  [74.9 kg (165 lb 2 oz)] 74.9 kg (165 lb 2 oz) (03/29 0949)  Weight change:  Filed Weights   11/16/17 1529 11/18/17 1015 11/20/17 0949  Weight: 76.2 kg (168 lb) 74.9 kg (165 lb 2 oz) 74.9 kg (165 lb 2 oz)    Intake/Output: I/O last 3 completed shifts: In: 0  Out: 1025 [Urine:1025]   Intake/Output this shift:  No intake/output data recorded.  Physical Exam: General: NAD, laying in the bed  Head: [place]/AT  Eyes: Anicteric  Neck: supple  Lungs:  clear to auscultation, normal effort  Heart: Regular, no rubs  Abdomen:  Soft, nontender, BS present  Extremities: Rt AKA, with Rt thigh edema, left leg edema  Neurologic: Alert, able to follow commands     Access: RIJ Permcath 11/16/17    Basic Metabolic Panel: Recent Labs  Lab 11/14/17 0721 11/17/17 1107 11/20/17 0152  NA 136 136 136  K 4.7 5.5* 4.3  CL 101 99* 98*  CO2 27 28 26   GLUCOSE 88 94 112*  BUN 30* 24* 29*  CREATININE 5.24* 5.49* 6.55*  CALCIUM 7.7* 7.9* 7.9*  PHOS  --   --  4.2    Liver Function Tests: No results for input(s): AST, ALT, ALKPHOS, BILITOT, PROT, ALBUMIN in the last 168 hours. No results for input(s): LIPASE, AMYLASE in the last 168 hours. No results for input(s): AMMONIA in the last 168 hours.  CBC: Recent Labs  Lab 11/13/17 1923 11/14/17 0721 11/14/17 1327 11/16/17 1545 11/20/17 0152  WBC  --  18.7*  --  13.2* 15.5*  HGB 7.3* 7.3* 7.4* 7.5* 6.2*  HCT 22.0* 21.9* 22.1* 22.3* 19.2*  MCV  --  88.7  --  89.2 88.2  PLT  --  441*  --  484* 498*    Cardiac Enzymes: No  results for input(s): CKTOTAL, CKMB, CKMBINDEX, TROPONINI in the last 168 hours.  BNP: Invalid input(s): POCBNP  CBG: No results for input(s): GLUCAP in the last 168 hours.  Microbiology: Results for orders placed or performed during the hospital encounter of 10/30/17  MRSA PCR Screening     Status: None   Collection Time: 10/30/17  7:54 PM  Result Value Ref Range Status   MRSA by PCR NEGATIVE NEGATIVE Final    Comment:        The GeneXpert MRSA Assay (FDA approved for NASAL specimens only), is one component of a comprehensive MRSA colonization surveillance program. It is not intended to diagnose MRSA infection nor to guide or monitor treatment for MRSA infections. Performed at [facility], [address] [place], [place] [address]   Culture, respiratory (NON-Expectorated)     Status: None   Collection Time: 11/05/17  8:17 AM  Result Value Ref Range Status   Specimen Description   Final    TRACHEAL ASPIRATE Performed at [facility], [address] [place], [place] [address]    Special Requests   Final    NONE Performed at [facility], [address] [place], [place] [address]    Gram  Stain   Final    FEW WBC PRESENT, PREDOMINANTLY PMN MODERATE GRAM NEGATIVE RODS RARE YEAST Performed at [facility], [address] [place], [place] [address]    Culture MODERATE ENTEROBACTER CLOACAE  Final   Report Status 11/08/2017 FINAL  Final   Organism ID, Bacteria ENTEROBACTER CLOACAE  Final      Susceptibility   Enterobacter cloacae - MIC*    CEFAZOLIN >=64 RESISTANT Resistant     CEFEPIME <=1 SENSITIVE Sensitive     CEFTAZIDIME <=1 SENSITIVE Sensitive     CEFTRIAXONE <=1 SENSITIVE Sensitive     CIPROFLOXACIN <=0.25 SENSITIVE Sensitive     GENTAMICIN <=1 SENSITIVE Sensitive     IMIPENEM <=0.25 SENSITIVE Sensitive     TRIMETH/SULFA <=20 SENSITIVE Sensitive     PIP/TAZO <=4 SENSITIVE Sensitive     * MODERATE ENTEROBACTER CLOACAE   Culture, blood (routine x 2)     Status: None   Collection Time: 11/09/17  8:49 PM  Result Value Ref Range Status   Specimen Description BLOOD DIALYSIS  Final   Special Requests   Final    BOTTLES DRAWN AEROBIC AND ANAEROBIC Blood Culture adequate volume   Culture   Final    NO GROWTH 5 DAYS Performed at [facility], [address] [place], [place] [address]    Report Status 11/14/2017 FINAL  Final    Coagulation Studies: No results for input(s): LABPROT, INR in the last 72 hours.  Urinalysis: No results for input(s): COLORURINE, LABSPEC, PHURINE, GLUCOSEU, HGBUR, BILIRUBINUR, KETONESUR, PROTEINUR, UROBILINOGEN, NITRITE, LEUKOCYTESUR in the last 72 hours.  Invalid input(s): APPERANCEUR    Imaging: Dg Chest 2 View  Result Date: 11/19/2017 CLINICAL DATA:  Shortness of breath EXAM: CHEST - 2 VIEW COMPARISON:  11/06/2017 FINDINGS: Removal of endotracheal and esophageal tubes. Right-sided central venous catheter tip overlies the SVC. Improved aeration of the lung bases. Borderline cardiomegaly. Mild vascular congestion. Focal airspace disease in the right upper and lower lobes. Small bilateral pleural effusion. IMPRESSION: 1. Removal of endotracheal and esophageal tubes. 2. Improved aeration at the bases. 3. Small bilateral pleural effusions. Mild perihilar interstitial opacity suspect for edema. More confluent airspace disease in the right upper and lower lobes of the lung may reflect superimposed pneumonia. Electronically Signed   By: Jasmine PangKim  Fujinaga M.D.   On: 11/19/2017 23:37     Medications:   . sodium chloride    . piperacillin-tazobactam (ZOSYN)  IV Stopped (11/20/17 0601)   . darbepoetin (ARANESP) injection - DIALYSIS  100 mcg Intravenous Q Wed-HD  . feeding supplement (ENSURE ENLIVE)  237 mL Oral TID BM  . ferrous sulfate  325 mg Oral BID WC  . gabapentin  100 mg Oral TID  . multivitamin  1 tablet Oral QHS  . multivitamin with minerals  1 tablet Oral Once per  day on Mon Wed Fri  . nicotine  21 mg Transdermal Daily  . QUEtiapine  25 mg Oral QHS  . senna  2 tablet Oral QHS  . sodium chloride flush  10-40 mL Intracatheter Q12H  . vitamin C  250 mg Oral BID   acetaminophen, ALPRAZolam, bisacodyl, dextrose, hydrALAZINE, ipratropium **OR** levalbuterol, morphine injection, naLOXone (NARCAN)  injection, nystatin cream, oxyCODONE-acetaminophen, sennosides, traMADol  Assessment/ Plan:  Joshua [name] is a 27 y.o. white male with asthma, bronchitis, polysubstance abuse, was admitted on3/8/2019after he was found down in a hotel for an unknown period of time in the fetal position. Hospital course includes prolonged CPR  for 45 minutes in the emergency room. Ventricular fibrillation arrest, hypothermia protocol.  Urine tox screen positive for cocaine and opiates.  Hospital course complicated by acute respiratory failure and acute renal failure. Started on CRRT 3/8. Underwent disarticulation of right lower extremity due to ischemic limb. Rhabdomyolysis with sustained CPK greater than 50,000 for several days.Self extubated on 3/15. Transitioned to intermittent hemodialysis on 3/16.   1.  End stage renal disease:  Cause Acute renal failure secondary to ATN, shock, hypotension and rhabdomyolysis, no recovery.   -Patient seen and evaluated during hemodialysis.  Appears to be tolerating well.  Ultrafiltration target 1.5-2 kg today.  Thereafter next dialysis on Monday.  2. Rhabdomyolysis: cocaine induced rhabdomyolysis.  We will draw CK today.  3.  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 Gilda Crease on 3/12.   Underwent revision AKA 3/19 - RLE appears to be healing.  4. LE edema -Still has prominent edema particularly in his left lower extremity.  Continue ultrafiltration with dialysis.  6.  Anemia of CKD.  Continue Aranesp 100 mcg IV once a week.    LOS: 21 Marissia Blackham 3/29/201911:14 AM

## 2017-11-20 NOTE — Progress Notes (Signed)
Post HD assessment   11/20/17 1400  Neurological  Level of Consciousness Alert  Orientation Level Oriented X4  Respiratory  Respiratory Pattern Regular;Unlabored  Chest Assessment Chest expansion symmetrical  Cardiac  ECG Monitor Yes  Cardiac Rhythm ST  Vascular  R Radial Pulse +2  L Radial Pulse +2  Edema Generalized;Facial  Integumentary  Integumentary (WDL) X  Skin Color Appropriate for ethnicity  Musculoskeletal  Musculoskeletal (WDL) X  Generalized Weakness Yes  Assistive Device None  GU Assessment  Genitourinary (WDL) X  Genitourinary Symptoms  (HD)  Psychosocial  Psychosocial (WDL) WDL

## 2017-11-20 NOTE — Progress Notes (Signed)
HD tx start   11/20/17 0955  Vital Signs  Pulse Rate (!) 101  Pulse Rate Source Monitor  Resp 16  BP (!) 166/98  BP Location Right Arm  BP Method Automatic  Patient Position (if appropriate) Lying  Oxygen Therapy  SpO2 92 %  O2 Device Nasal Cannula  O2 Flow Rate (L/min) 2 L/min  During Hemodialysis Assessment  Blood Flow Rate (mL/min) 400 mL/min  Arterial Pressure (mmHg) -160 mmHg  Venous Pressure (mmHg) 180 mmHg  Transmembrane Pressure (mmHg) 60 mmHg  Ultrafiltration Rate (mL/min) 720 mL/min  Dialysate Flow Rate (mL/min) 800 ml/min  Conductivity: Machine  14.1  HD Safety Checks Performed Yes  Dialysis Fluid Bolus Normal Saline  Bolus Amount (mL) 250 mL  Intra-Hemodialysis Comments Tx initiated  Hemodialysis Catheter Right Subclavian Double-lumen  Placement Date/Time: 11/16/17 1655   Time Out: Correct patient;Correct site;Correct procedure  Maximum sterile barrier precautions: Hand hygiene;Sterile gloves;Cap;Large sterile sheet;Mask;Sterile gown  Site Prep: Chlorhexidine;Betadine  Local Anesthe...  Blue Lumen Status Infusing  Red Lumen Status Infusing

## 2017-11-20 NOTE — Progress Notes (Addendum)
PT Cancellation Note  Patient Details Name: Joshua [name] MRN: [id number] DOB: 09/08/1990   Cancelled Treatment:    Reason Eval/Treat Not Completed: Patient at procedure or test/unavailable(Patient currently off unit for dialysis.  To received PRBC during dialysis treatment (current HgB 6.2).  Will re-attempt at later time/date as patient medically appropriate and available.)   Valiant Dills H. Manson PasseyBrown, PT, DPT, NCS 11/20/17, 11:37 AM [phone]

## 2017-11-20 NOTE — Progress Notes (Signed)
MD notified of pt c/o chest pains and EKG results during rapid response. Okay per dr. Caryn Beemaier to give morphine and recheck labs. Lab results reviewed and Dr. Sheryle Hailiamond notified of low HGB. Orders for blood transfusion. Will continue to monitor.

## 2017-11-20 NOTE — Progress Notes (Addendum)
Physical Therapy Treatment Patient Details Name: Joshua [name] MRN: [id number] DOB: [birth year] Today's Date: 11/20/2017    History of Present Illness 26yo male pt presented to ER after being found unresponsive, down for unknown period of time, related to ingestion of controlled substances.  Patient suffered vfib arrest en route and underwent 45 minutes CPR; subsequently transitioned to hypothermia cooling protocol.  Remained intubated and sedated until self-extubation 3/15. UDS positive for cocaine and opiates.  Hospital course additionally significant for severe rhabomyolisis with ischemic R LE (status post emergent knee disarticulation 3/12 with revision to R AKA 3/15); initiation of CRRT due to renal failure, now transitioned to temporary dialysis via R IJ, scheduled for conversion to permcath with HD next date.       PT Comments    Marked improvement in gait performance and overall activity tolerance this date.  Continues to require min assist +1 and constant verbal cuing for safety needs throughout session.  Improving balance in L SLS, but poor awareness of limits of stability and overall safety needs.  Very impulsive.  Patient status post 1 unit PRBC transfusion during dialysis this date; asymptomatic during session.  Sats >90% on RA throughout session.   Follow Up Recommendations  CIR     Equipment Recommendations  Rolling walker with 5" wheels    Recommendations for Other Services       Precautions / Restrictions Precautions Precautions: Fall Precaution Comments: pressure area to L heel, R IJ perm-cath Restrictions Weight Bearing Restrictions: Yes RLE Weight Bearing: Non weight bearing Other Position/Activity Restrictions: R AKA    Mobility  Bed Mobility Overal bed mobility: Modified Independent                Transfers Overall transfer level: Needs assistance Equipment used: Rolling walker (2 wheeled) Transfers: Sit to/from Stand Sit to Stand: Min guard          General transfer comment: cuing for hand placement; limited carry-over of new information within and between sessions  Ambulation/Gait Ambulation/Gait assistance: Min assist;+2 safety/equipment Ambulation Distance (Feet): 75 Feet(x2) Assistive device: Rolling walker (2 wheeled)     Gait velocity interpretation: Below normal speed for age/gender General Gait Details: improving UE strength/endurance with increased tolerance, stability noted; much more consistent, confident hopping pattrn with L shoe donned for gait trial   Stairs            Wheelchair Mobility    Modified Rankin (Stroke Patients Only)       Balance Overall balance assessment: Needs assistance Sitting-balance support: No upper extremity supported;Feet supported Sitting balance-Leahy Scale: Good     Standing balance support: Single extremity supported;Bilateral upper extremity supported Standing balance-Leahy Scale: Fair Standing balance comment: requires at least unilateral UE support at all times                            Cognition Arousal/Alertness: Awake/alert Behavior During Therapy: Impulsive;WFL for tasks assessed/performed Overall Cognitive Status: Impaired/Different from baseline                                 General Comments: Pt A&Ox4, increased time for processing of new info and multi step commands, poor insight/safety awareness but with VC is able to improve attention      Exercises Other Exercises Other Exercises: Standing at sink for grooming tasks (Washing face, rinsing hair), min assist.  Frequent cuing to ensure  unilateral UE placement on counter/walker for optimal stability.  PAtient generally uncertain of balance, consistently asking therapist "you got me?", but impulsively releasing hand placemnet on supportive surfaces and moving outside BOS.  Constant hands-on assist for balance, sup for safety. Other Exercises: Educated in R UE hip extension and hip  adduct exercises to be performed outside of therapy, 1x10.  Patient voiced understanding; will reinforce as necessary. Other Exercises: Sit/stand x2 with rW, emphasis on hand placement.    General Comments        Pertinent Vitals/Pain Pain Assessment: Faces Faces Pain Scale: Hurts little more Pain Location: R residual limb Pain Descriptors / Indicators: Burning;Constant Pain Intervention(s): Limited activity within patient's tolerance;Monitored during session;Repositioned    Home Living                      Prior Function            PT Goals (current goals can now be found in the care plan section) Acute Rehab PT Goals Patient Stated Goal: To be able to do things for himself again PT Goal Formulation: With patient Time For Goal Achievement: 11/28/17 Potential to Achieve Goals: Good Progress towards PT goals: Progressing toward goals    Frequency    7X/week      PT Plan Current plan remains appropriate    Co-evaluation              AM-PAC PT "6 Clicks" Daily Activity  Outcome Measure  Difficulty turning over in bed (including adjusting bedclothes, sheets and blankets)?: A Little Difficulty moving from lying on back to sitting on the side of the bed? : A Little Difficulty sitting down on and standing up from a chair with arms (e.g., wheelchair, bedside commode, etc,.)?: A Little Help needed moving to and from a bed to chair (including a wheelchair)?: A Little Help needed walking in hospital room?: A Lot Help needed climbing 3-5 steps with a railing? : Total 6 Click Score: 15    End of Session Equipment Utilized During Treatment: Gait belt Activity Tolerance: Patient tolerated treatment well Patient left: in chair;with call bell/phone within reach;with chair alarm set Nurse Communication: Mobility status PT Visit Diagnosis: Difficulty in walking, not elsewhere classified (R26.2);Muscle weakness (generalized) (M62.81)     Time: [id number] PT Time  Calculation (min) (ACUTE ONLY): 38 min  Charges:  $Gait Training: 8-22 mins $Therapeutic Exercise: 8-22 mins $Therapeutic Activity: 8-22 mins                    G Codes:       [name], PT, DPT, NCS 11/20/17, 10:46 PM [phone]

## 2017-11-20 NOTE — Progress Notes (Signed)
Pharmacy Antibiotic Note  Joshua [name] is a 27 y.o. male admitted on 10/30/2017 with pneumonia.  Pharmacy has been consulted for vanc/zosyn dosing.  Plan: Patient received vanc 1g, will give another 750 mg for a total of 1.75g IV x 1  Patient is a new start on dialysis and will possibly have another session 03/29. Will order a random level for 03/30 w/ am labs. If random level < 25 mcg/mL will dose vanc 1g IV qHD session. Will start zosyn 3.375g IV q12h for CrCl < 20 ml/min  Height: 5\' 10"  (177.8 cm) Weight: 165 lb 2 oz (74.9 kg) IBW/kg (Calculated) : 73  Temp (24hrs), Avg:100.2 F (37.9 C), Min:99.2 F (37.3 C), Max:102.7 F (39.3 C)  Recent Labs  Lab 11/14/17 0721 11/16/17 1545 11/17/17 1107 11/20/17 0152  WBC 18.7* 13.2*  --  15.5*  CREATININE 5.24*  --  5.49* 6.55*    Estimated Creatinine Clearance: 17.3 mL/min (A) (by C-G formula based on SCr of 6.55 mg/dL (H)).    No Known Allergies  Thank you for allowing pharmacy to be a part of this patient's care.  Thomasene Rippleavid Rolin Schult, PharmD, BCPS Clinical Pharmacist 11/20/2017

## 2017-11-20 NOTE — Progress Notes (Signed)
Pre HD assessment   11/20/17 0950  Neurological  Level of Consciousness Alert  Orientation Level Oriented X4  Respiratory  Respiratory Pattern Regular;Unlabored  Chest Assessment Chest expansion symmetrical  Cardiac  ECG Monitor Yes  Vascular  R Radial Pulse +2  L Radial Pulse +2  Edema Generalized;Facial  Integumentary  Integumentary (WDL) X  Skin Color Appropriate for ethnicity  Musculoskeletal  Musculoskeletal (WDL) X  Generalized Weakness Yes  Assistive Device None  Gastrointestinal  Bowel Sounds Assessment Active  GU Assessment  Genitourinary (WDL) X  Genitourinary Symptoms  (HD)  Psychosocial  Psychosocial (WDL) WDL

## 2017-11-20 NOTE — Progress Notes (Signed)
Dr Cherlynn KaiserSainani called to get an update on the patient.  He has requested that the patient only receive 1 unit of RBC's and that the patient received the unit of blood while in dialysis

## 2017-11-20 NOTE — Care Management (Signed)
Informed that patient has not sat for dialysis.  Patient must be able to sit for entire treatment and this documented before discharge to an outpatient clinic.

## 2017-11-20 NOTE — Progress Notes (Signed)
[facility] [place] [facility]   PATIENT NAME: Joshua PlanasMark Rought    MR#:  [id number]  DATE OF BIRTH:  10/12/1990  SUBJECTIVE:   Pt. Developed a fever overnight, and her O2 requirements increased. CXR done showing possible Pneumonia/fluid overload.  Seen on hemodialysis and tolerating dialysis well.  Is complaining of some mild shortness of breath.  REVIEW OF SYSTEMS:    Review of Systems  Constitutional: Negative for chills and fever.  HENT: Negative for congestion and tinnitus.   Eyes: Negative for blurred vision and double vision.  Respiratory: Positive for shortness of breath. Negative for cough and wheezing.   Cardiovascular: Negative for chest pain, orthopnea and PND.  Gastrointestinal: Negative for abdominal pain, diarrhea, nausea and vomiting.  Genitourinary: Negative for dysuria and hematuria.  Musculoskeletal: Positive for back pain.  Neurological: Negative for dizziness, sensory change and focal weakness.  All other systems reviewed and are negative.   Nutrition: Regular Tolerating Diet: yes Tolerating PT: Eval noted.     DRUG ALLERGIES:  No Known Allergies  VITALS:  Blood pressure (!) 178/85, pulse (!) 110, temperature 100.2 F (37.9 C), temperature source Oral, resp. rate 17, height 5\' 10"  (1.778 m), weight 72.7 kg (160 lb 4.4 oz), SpO2 100 %.  PHYSICAL EXAMINATION:   Physical Exam  GENERAL:  27 y.o.-year-old patient lying in bed in no acute distress.  EYES: Pupils equal, round, reactive to light and accommodation. No scleral icterus. Extraocular muscles intact.  HEENT: Head atraumatic, normocephalic. Oropharynx and nasopharynx clear.  NECK:  Supple, no jugular venous distention. No thyroid enlargement, no tenderness.  LUNGS: Normal breath sounds bilaterally, no wheezing, rales, rhonchi. No use of accessory muscles of respiration.  CARDIOVASCULAR: S1, S2 normal. No murmurs, rubs, or gallops.  ABDOMEN: Soft, nontender, nondistended. Bowel sounds  present. No organomegaly or mass.  EXTREMITIES: No cyanosis, clubbing or edema b/l.   S/p Right AKA NEUROLOGIC: Cranial nerves II through XII are intact. No focal Motor or sensory deficits b/l.  Globally weak.  PSYCHIATRIC: The patient is alert and oriented x 3.  SKIN: No obvious rash, lesion, or ulcer.  GU - + Scrotal Edema.   Right chest wall PermCath in place with no acute bleeding or drainage noted.  LABORATORY PANEL:   CBC Recent Labs  Lab 11/20/17 0152  WBC 15.5*  HGB 6.2*  HCT 19.2*  PLT 498*   ------------------------------------------------------------------------------------------------------------------  Chemistries  Recent Labs  Lab 11/20/17 0152  NA 136  K 4.3  CL 98*  CO2 26  GLUCOSE 112*  BUN 29*  CREATININE 6.55*  CALCIUM 7.9*   ------------------------------------------------------------------------------------------------------------------  Cardiac Enzymes No results for input(s): TROPONINI in the last 168 hours. ------------------------------------------------------------------------------------------------------------------  RADIOLOGY:  Dg Chest 2 View  Result Date: 11/19/2017 CLINICAL DATA:  Shortness of breath EXAM: CHEST - 2 VIEW COMPARISON:  11/06/2017 FINDINGS: Removal of endotracheal and esophageal tubes. Right-sided central venous catheter tip overlies the SVC. Improved aeration of the lung bases. Borderline cardiomegaly. Mild vascular congestion. Focal airspace disease in the right upper and lower lobes. Small bilateral pleural effusion. IMPRESSION: 1. Removal of endotracheal and esophageal tubes. 2. Improved aeration at the bases. 3. Small bilateral pleural effusions. Mild perihilar interstitial opacity suspect for edema. More confluent airspace disease in the right upper and lower lobes of the lung may reflect superimposed pneumonia. Electronically Signed   By: Jasmine PangKim  Fujinaga M.D.   On: 11/19/2017 23:37     ASSESSMENT AND PLAN:    27 year old male with known history  of asthma, tobacco abuse, IVDA presents to hospital secondary to an unresponsive state.  1. Acutecardiac arrest - Most likely secondary to polysubstance overdose - resolved and pt. Presently stable.   2. Acuteend-stage renal disease  - Secondary to cocaine induced rhabdomyolysis  -Status post PermCath placement now and Cont. Dialysis as per Nephrology.  -As per nephrology patient is now ESRD.  Working on outpatient spot for HD.    3. Rhabdomyolysis, acute - Resolved, Secondary topossible ischemic right leg with compartment syndrome. - s/p right leg disarticulation at the [facility] 3/12/19followed by revision with AKA on November 11, 2017, patient recovering well  4.  Fever/shortness of breath-patient developed some fever of 101 and some hypoxia overnight.  Chest x-ray showing possible pneumonia/bilateral pleural effusions. -Empirically started on IV vancomycin, Zosyn.  Follow fever curve and cultures.  5. Hyperkalemia - due to acute on chronic renal failure.  - cont. To follow with HD.    6. 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   7.Acute suicidal thoughts - seen by Psych and no acute inpatient Psych needs presently.  Compounded by polysubstance abuse. Stable. No SI/HI.    8.Acute on chronic pain syndrome Compounded by right AKA - cont. Oxycontin, Morphine, Neurontin.   9.Acute on chronic anemia  Improved S/P 2 units PRBCs - follow Hg. And Transfuse if < 7.    10.  Acute Enterobacter infection - finished treatment with Cipro.     All the records are reviewed and case discussed with Care Management/Social Worker. Management plans discussed with the patient, family and they are in agreement.  CODE STATUS: Full code  DVT Prophylaxis: Ted's & SCD's  TOTAL TIME TAKING  CARE OF THIS PATIENT: 25 minutes.   POSSIBLE D/C unclear and depends of pt. Getting outpatient spot for HD.    Houston Siren M.D on 11/20/2017 at 3:26 PM  Between 7am to 6pm - Pager - [phone]  After 6pm go to [email or web address] - Social research officer, government  [facility]  Office  [phone]  CC: Primary care physician; Patient, No Pcp Per

## 2017-11-20 NOTE — Progress Notes (Signed)
Patient returned from dialysis.

## 2017-11-20 NOTE — Progress Notes (Signed)
Rapid Response Event Note  Overview:RR called pt in bed RT at bedside  Pt awake and alert.      Initial Focused Assessment:pt on 2.5L [place] sats 94% pt c/o burnig in chest radiating to abd. Pt denied nausea or heart burn . [id number]. Mild tremors at times   Interventions:EKG done pt sinus tach vs-WNL. Nurse placed call to MD for further =orders pt stable at present  Plan of Care (if not transferred):remain on floor and follow with MD.   Event Summary:   at  0029    at          [facility],Michelle J

## 2017-11-20 NOTE — Consult Note (Signed)
Pharmacy Antibiotic Note  Joshua HubertMark A [name] is a 27 y.o. male admitted on 10/30/2017 with pneumonia.  Pharmacy has been consulted for vancomycin and zosyn dosing.  Plan: Patient is new to HD, however has been undergoing and tolerating full HD sessions. I will therefore order post HD vanc 750mg  q MWF. Will follow closely for any changes to HD session. Zosyn 3.375g q 12 hr  Height: 5\' 10"  (177.8 cm) Weight: 160 lb 4.4 oz (72.7 kg) IBW/kg (Calculated) : 73  Temp (24hrs), Avg:99.9 F (37.7 C), Min:99 F (37.2 C), Max:102.7 F (39.3 C)  Recent Labs  Lab 11/14/17 0721 11/16/17 1545 11/17/17 1107 11/20/17 0152  WBC 18.7* 13.2*  --  15.5*  CREATININE 5.24*  --  5.49* 6.55*    Estimated Creatinine Clearance: 17.3 mL/min (A) (by C-G formula based on SCr of 6.55 mg/dL (H)).    No Known Allergies  Antimicrobials this admission: zosyn 3/29 >>  vancomycin 3/29 >>   Dose adjustments this admission:   Microbiology results:  Thank you for allowing pharmacy to be a part of this patient's care.  Olene FlossMelissa D Honour Schwieger, Pharm.D, BCPS Clinical Pharmacist  11/20/2017 2:07 PM

## 2017-11-20 NOTE — Progress Notes (Signed)
Pre HD assessment   11/20/17 0949  Vital Signs  Temp 99.5 F (37.5 C)  Temp Source Oral  Pulse Rate 99  Pulse Rate Source Monitor  Resp 18  BP (!) 169/97  BP Location Right Arm  BP Method Automatic  Patient Position (if appropriate) Lying  Oxygen Therapy  SpO2 95 %  O2 Device Nasal Cannula  O2 Flow Rate (L/min) 2 L/min  Pain Assessment  Pain Scale 0-10  Pain Score 0  Dialysis Weight  Weight 74.9 kg (165 lb 2 oz)  Type of Weight Pre-Dialysis  Time-Out for Hemodialysis  What Procedure? HD  Pt Identifiers(min of two) First/Last Name;MRN/Account#  Correct Site? Yes  Correct Side? Yes  Correct Procedure? Yes  Consents Verified? Yes  Rad Studies Available? N/A  Safety Precautions Reviewed? Yes  Biochemist, clinicalMachine Checks  Machine Number  (2A)  Station Number 4  UF/Alarm Test Passed  Conductivity: Meter 13.8  Conductivity: Machine  14.1  pH 7.4  Reverse Osmosis main  Normal Saline Lot Number [id number]  Dialyzer Lot Number [id number]  Disposable Set Lot Number [id number]  Machine Temperature 98.6 F (37 C)  ImmunologistAir Detector Armed and Audible Yes  Blood Lines Intact and Secured Yes  Pre Treatment Patient Checks  Vascular access used during treatment Catheter  Hepatitis B Surface Antigen Results Negative  Date Hepatitis B Surface Antigen Drawn 11/07/17  Hepatitis B Surface Antibody  (<10)  Date Hepatitis B Surface Antibody Drawn 11/07/17  Hemodialysis Consent Verified Yes  Hemodialysis Standing Orders Initiated Yes  ECG (Telemetry) Monitor On Yes  Prime Ordered Normal Saline  Length of  DialysisTreatment -hour(s) 3.5 Hour(s)  Dialyzer Elisio 17H NR  Dialysate 2K, 2.5 Ca  Dialysis Anticoagulant None  Dialysate Flow Ordered 800  Blood Flow Rate Ordered 400 mL/min  Ultrafiltration Goal 2 Liters  Pre Treatment Labs Phosphorus  Blood Products Ordered Packed Red Blood Cells  Dialysis Blood Pressure Support Ordered Normal Saline  Education / Care Plan  Dialysis Education Provided Yes   Documented Education in Care Plan Yes  Hemodialysis Catheter Right Subclavian Double-lumen  Placement Date/Time: 11/16/17 1655   Time Out: Correct patient;Correct site;Correct procedure  Maximum sterile barrier precautions: Hand hygiene;Sterile gloves;Cap;Large sterile sheet;Mask;Sterile gown  Site Prep: Chlorhexidine;Betadine  Local Anesthe...  Site Condition No complications  Blue Lumen Status Heparin locked  Red Lumen Status Heparin locked  Purple Lumen Status N/A  Dressing Type Biopatch  Dressing Status Clean;Dry;Intact  Drainage Description None  Dressing Change Due 11/25/17

## 2017-11-20 NOTE — Progress Notes (Signed)
Palliative care will sign off at this time. Please call [phone] or reconsult for further palliative needs. Thank you for this consult.   Jadamarie Butson, NP Palliative Medicine Team Pager # [phone] (M-F 8a-5p) Team Phone # [phone] (Nights/Weekends) 

## 2017-11-20 NOTE — Progress Notes (Signed)
OT Cancellation Note  Patient Details Name: Joshua [name] MRN: [id number] DOB: 11/28/1990   Cancelled Treatment:    Reason Eval/Treat Not Completed: Medical issues which prohibited therapy Chart reviewed and pt placed on hold due to HgB of 6.2.  WIll re-assess tomorrow to continue OT.  Joshua [name], OTR/L ascom [phone] 11/20/17, 4:29 PM

## 2017-11-21 LAB — CBC
HCT: 22.5 % — ABNORMAL LOW (ref 40.0–52.0)
Hemoglobin: 7.4 g/dL — ABNORMAL LOW (ref 13.0–18.0)
MCH: 27.9 pg (ref 26.0–34.0)
MCHC: 32.8 g/dL (ref 32.0–36.0)
MCV: 85.2 fL (ref 80.0–100.0)
PLATELETS: 440 10*3/uL (ref 150–440)
RBC: 2.64 MIL/uL — ABNORMAL LOW (ref 4.40–5.90)
RDW: 15.2 % — AB (ref 11.5–14.5)
WBC: 10.9 10*3/uL — ABNORMAL HIGH (ref 3.8–10.6)

## 2017-11-21 NOTE — Progress Notes (Signed)
Occupational Therapy Treatment Patient Details Name: Joshua [name] MRN: [id number] DOB: 03-Jun-1991 Today's Date: 11/21/2017    History of present illness 26yo male pt presented to ER after being found unresponsive, down for unknown period of time, related to ingestion of controlled substances.  Patient suffered vfib arrest en route and underwent 45 minutes CPR; subsequently transitioned to hypothermia cooling protocol.  Remained intubated and sedated until self-extubation 3/15. UDS positive for cocaine and opiates.  Hospital course additionally significant for severe rhabomyolisis with ischemic R LE (status post emergent knee disarticulation 3/12 with revision to R AKA 3/15); initiation of CRRT due to renal failure, now transitioned to temporary dialysis via R IJ, scheduled for conversion to permcath with HD next date.      OT comments  Pt show great progress compare to week ago - pt in regular shorts in bed and high top shoe on - walked with PT  Pt willing to work with OT- work on  Pre ADL's skills - in bed and on EOB - no LOB and act tolerance still decrease - Pt did c/o of low back pain with some of the act - while on EOB had pt reach over head and down to feet  - pt can benefit from OT services - and recommend STR and then out pt prosthesis trng - Robin in [place] and neuro rehab recommmended.            Follow Up Recommendations       Equipment Recommendations       Recommendations for Other Services      Precautions / Restrictions Precautions Precautions: Fall Precaution Comments: pressure area to L heel, R IJ perm-cath Restrictions Weight Bearing Restrictions: Yes RLE Weight Bearing: Non weight bearing Other Position/Activity Restrictions: R AKA       Mobility Bed Mobility Overal bed mobility: Modified Independent         Sit to supine: Modified independent (Device/Increase time)      Transfers               Balance Over                           ADL either performed or assessed with clinical judgement   ADL                                               Vision       Perception     Praxis      Cognition Arousal/Alertness: Awake/alert Behavior During Therapy: Impulsive;WFL for tasks assessed/performed Overall Cognitive Status: Impaired/Different from baseline Area of Impairment: Problem solving;Safety/judgement                             Problem Solving: Slow processing;Requires verbal cues          Exercises Amputee Exercises Hip Extension: AROM;AAROM;Right;10 reps;Standing;Sidelying Other Exercises Other Exercises: Bridging in bed done 10 reps with mod A - to assist in LB dressing and bathing  Other Exercises: SItting on EOB - S for llines for supine<> sit , No LOB on EOB - simulate UB ADL's no issues- except lines in way and pt afraid he'll pull it out  Other Exercises: Sitting EOB - simlulate bending down for LB dressing- had hightop shoe on  L that use for walking earlier - No LOB - but c/o backpain but did reach over head and down to shoe 10 reps  No UE support during whole time  Other Exercises: Answered questions regarding progress in balance , ADL's in bed ,  recovery process, process for obtaining/learning prosthesis; patient motivated, eager for recovery as able.   Shoulder Instructions       General Comments      Pertinent Vitals/ Pain       Pain Assessment: 0-10 Pain Score: 8  Pain Location: R residual limb Pain Descriptors / Indicators: Aching;Constant Pain Intervention(s): Limited activity within patient's tolerance  Home Living                                          Prior Functioning/Environment              Frequency           Progress Toward Goals  OT Goals(current goals can now be found in the care plan section)  Progress towards OT goals: Progressing toward goals     Plan Discharge plan remains appropriate;Frequency  remains appropriate    Co-evaluation                 AM-PAC PT "6 Clicks" Daily Activity     Outcome Measure                    End of Session    OT Visit Diagnosis: Muscle weakness (generalized) (M62.81);Pain   Activity Tolerance Patient tolerated treatment well   Patient Left in bed;with call bell/phone within reach;with bed alarm set   Nurse Communication          Time: [id number] OT Time Calculation (min): 27 min  Charges: OT General Charges $OT Visit: 1 Visit OT Treatments $Self Care/Home Management : 8-22 mins     Joanne Salah OTR/L,CLT 11/21/2017, 12:15 PM

## 2017-11-21 NOTE — Progress Notes (Signed)
Physical Therapy Treatment Patient Details Name: Joshua [name] MRN: [id number] DOB: April 27, 1991 Today's Date: 11/21/2017    History of Present Illness 27yo male pt presented to ER after being found unresponsive, down for unknown period of time, related to ingestion of controlled substances.  Patient suffered vfib arrest en route and underwent 45 minutes CPR; subsequently transitioned to hypothermia cooling protocol.  Remained intubated and sedated until self-extubation 3/15. UDS positive for cocaine and opiates.  Hospital course additionally significant for severe rhabomyolisis with ischemic R LE (status post emergent knee disarticulation 3/12 with revision to R AKA 3/15); initiation of CRRT due to renal failure, now transitioned to temporary dialysis via R IJ, scheduled for conversion to permcath with HD next date.       PT Comments    Pt in recliner upon arrival.  Ready for session.  He was able to stand with min guard and verbal cues for hand placements.  While standing for grooming tasks, he is unable to stand without 1 UE support and for extended times and more difficult tasks, he braces his hip against windowsill for balance.  After seated rest, he is able to ambulate 150' with walker and min guard x 2 (and wheelchair accessible).  Overall gait improved this session with improved control and distance.  He continues with poor self assessment of fatigue and needs cueing to stop and rest at times and at 150' to sit as gait quality was decreasing including a more forward lean and less step height.  He was however less impulsive today and was interested in teaching and redirection when needed.  Left shoe was worn for gait.  After seated rest, he was able to navigate around unit with BUE's to propel chair and initial wheelchair education for brakes, leg rests etc was started.  Pt voiced being anxious to get outside and get some independence again.  Session also focused on RLE stump education.  He complained  of hip soreness and extensive education for positioning and stretching was given.  While standing he practiced hip extension.  Encouraged flat supine position for sleeping.  He was able to roll onto left side lying with ease and active AROM and stretching to R hip was performed.  He does have some minimal hip flexor tightness.  Encouraged to do above exercises 3-5 times daily to prevent contracture in hip.  He voiced understanding.  Discussed recovery process and expectations.    Pt would still benefit from CIR if available to him.  While gait distance is increasing, balance remains impaired and he requires +2 assist for gait at this time or for higher level balance activities in standing.  He is motivated to increase his independence and work towards prosthetic training when appropriate.     Follow Up Recommendations  CIR     Equipment Recommendations  Rolling walker with 5" wheels    Recommendations for Other Services       Precautions / Restrictions Precautions Precautions: Fall Precaution Comments: pressure area to L heel, R IJ perm-cath Restrictions Weight Bearing Restrictions: Yes RLE Weight Bearing: Non weight bearing Other Position/Activity Restrictions: R AKA    Mobility  Bed Mobility Overal bed mobility: Modified Independent         Sit to supine: Modified independent (Device/Increase time)      Transfers Overall transfer level: Needs assistance Equipment used: Rolling walker (2 wheeled) Transfers: Sit to/from Stand Sit to Stand: Min assist         General transfer comment:  cuing for hand placement; limited carry-over of new information within and between sessions  Ambulation/Gait Ambulation/Gait assistance: +2 safety/equipment;Min guard Ambulation Distance (Feet): 150 Feet Assistive device: Rolling walker (2 wheeled) Gait Pattern/deviations: Step-to pattern         Administratortairs            Wheelchair Mobility Wheelchair Mobility Wheelchair mobility:  Yes Wheelchair propulsion: Both upper extremities Wheelchair parts: Supervision/cueing Distance: 200 Wheelchair Assistance Details (indicate cue type and reason): navigated well in hallways  Modified Rankin (Stroke Patients Only)       Balance Overall balance assessment: Needs assistance Sitting-balance support: No upper extremity supported;Feet supported Sitting balance-Leahy Scale: Good     Standing balance support: Single extremity supported;Bilateral upper extremity supported Standing balance-Leahy Scale: Fair Standing balance comment: requires at least unilateral UE support at all times                            Cognition Arousal/Alertness: Awake/alert Behavior During Therapy: Impulsive;WFL for tasks assessed/performed Overall Cognitive Status: Impaired/Different from baseline                                        Exercises Amputee Exercises Hip Extension: AROM;AAROM;Right;10 reps;Standing;Sidelying    General Comments        Pertinent Vitals/Pain Pain Assessment: 0-10 Pain Score: 6  Pain Location: R residual limb Pain Descriptors / Indicators: Burning;Constant Pain Intervention(s): Limited activity within patient's tolerance;Monitored during session    Home Living                      Prior Function            PT Goals (current goals can now be found in the care plan section) Progress towards PT goals: Progressing toward goals    Frequency    7X/week      PT Plan Current plan remains appropriate    Co-evaluation              AM-PAC PT "6 Clicks" Daily Activity  Outcome Measure  Difficulty turning over in bed (including adjusting bedclothes, sheets and blankets)?: None Difficulty moving from lying on back to sitting on the side of the bed? : A Little Difficulty sitting down on and standing up from a chair with arms (e.g., wheelchair, bedside commode, etc,.)?: A Little Help needed moving to and from  a bed to chair (including a wheelchair)?: A Little Help needed walking in hospital room?: A Lot Help needed climbing 3-5 steps with a railing? : Total 6 Click Score: 16    End of Session Equipment Utilized During Treatment: Gait belt Activity Tolerance: Patient tolerated treatment well Patient left: in bed;with bed alarm set;with call bell/phone within reach Nurse Communication: Mobility status       Time: [id number] PT Time Calculation (min) (ACUTE ONLY): 38 min  Charges:  $Gait Training: 8-22 mins $Therapeutic Exercise: 8-22 mins $Therapeutic Activity: 8-22 mins                    G Codes:       Danielle DessSarah Vennela Jutte, PTA 11/21/17, 10:58 AM

## 2017-11-21 NOTE — Progress Notes (Addendum)
Joshua [name] ID: Joshua [name] Odwyer, male   DOB: 10/01/1990, 27 y.o.   MRN: [id number]  [facility] PROGRESS NOTE  Joshua [name] Lacks LKG:[id number] DOB: 07/24/1991 DOA: 10/30/2017 PCP: Joshua [name], No Pcp Per  HPI/Subjective: Joshua [name] complains of little pain in the right lower extremity at amputation site.  Joshua [name] feels okay.  Breathing okay.  Asking if he can move around [name] little bit more.  Objective: Vitals:   11/20/17 1917 11/21/17 0504  BP: (!) 146/95 (!) 143/79  Pulse: 86 84  Resp: 16 16  Temp: 98.4 F (36.9 C) 99.2 F (37.3 C)  SpO2: 100% 97%    Filed Weights   11/18/17 1015 11/20/17 0949 11/20/17 1358  Weight: 74.9 kg (165 lb 2 oz) 74.9 kg (165 lb 2 oz) 72.7 kg (160 lb 4.4 oz)    ROS: Review of Systems  Constitutional: Negative for chills and fever.  Eyes: Negative for blurred vision.  Respiratory: Negative for cough and shortness of breath.   Cardiovascular: Negative for chest pain.  Gastrointestinal: Negative for abdominal pain, constipation, diarrhea, nausea and vomiting.  Genitourinary: Negative for dysuria.  Musculoskeletal: Positive for joint pain.  Neurological: Negative for dizziness and headaches.   Exam: Physical Exam  HENT:  Nose: No mucosal edema.  Mouth/Throat: No oropharyngeal exudate or posterior oropharyngeal edema.  Eyes: Pupils are equal, round, and reactive to light. Conjunctivae, EOM and lids are normal.  Neck: No JVD present. Carotid bruit is not present. No edema present. No thyroid mass and no thyromegaly present.  Cardiovascular: S1 normal and S2 normal. Exam reveals no gallop.  No murmur heard. Pulses:      Dorsalis pedis pulses are 2+ on the left side.  Respiratory: No respiratory distress. He has no wheezes. He has no rhonchi. He has no rales.  GI: Soft. Bowel sounds are normal. There is no tenderness.  Musculoskeletal:       Left ankle: He exhibits swelling.  Lymphadenopathy:    He has no cervical adenopathy.  Neurological: He is alert. No cranial  nerve deficit.  Skin: Skin is warm. No rash noted. Nails show no clubbing.  AKA amputation site looks clean and dry.  Staples still in.  Psychiatric: He has [name] normal mood and affect.      Data Reviewed: Basic Metabolic Panel: Recent Labs  Lab 11/17/17 1107 11/20/17 0152  NA 136 136  K 5.5* 4.3  CL 99* 98*  CO2 28 26  GLUCOSE 94 112*  BUN 24* 29*  CREATININE 5.49* 6.55*  CALCIUM 7.9* 7.9*  PHOS  --  4.2   CBC: Recent Labs  Lab 11/14/17 1327 11/16/17 1545 11/20/17 0152 11/21/17 0431  WBC  --  13.2* 15.5* 10.9*  HGB 7.4* 7.5* 6.2* 7.4*  HCT 22.1* 22.3* 19.2* 22.5*  MCV  --  89.2 88.2 85.2  PLT  --  484* 498* 440   Cardiac Enzymes: Recent Labs  Lab 11/20/17 1000  CKTOTAL 236     Studies: Dg Chest 2 View  Result Date: 11/19/2017 CLINICAL DATA:  Shortness of breath EXAM: CHEST - 2 VIEW COMPARISON:  11/06/2017 FINDINGS: Removal of endotracheal and esophageal tubes. Right-sided central venous catheter tip overlies the SVC. Improved aeration of the lung bases. Borderline cardiomegaly. Mild vascular congestion. Focal airspace disease in the right upper and lower lobes. Small bilateral pleural effusion. IMPRESSION: 1. Removal of endotracheal and esophageal tubes. 2. Improved aeration at the bases. 3. Small bilateral pleural effusions. Mild perihilar interstitial opacity suspect for edema. More confluent airspace  disease in the right upper and lower lobes of the lung may reflect superimposed pneumonia. Electronically Signed   By: Jasmine Pang M.D.   On: 11/19/2017 23:37    Scheduled Meds: . darbepoetin (ARANESP) injection - DIALYSIS  100 mcg Intravenous Q Wed-HD  . feeding supplement (ENSURE ENLIVE)  237 mL Oral TID BM  . ferrous sulfate  325 mg Oral BID WC  . gabapentin  100 mg Oral TID  . multivitamin  1 tablet Oral QHS  . multivitamin with minerals  1 tablet Oral Once per day on Mon Wed Fri  . nicotine  21 mg Transdermal Daily  . QUEtiapine  25 mg Oral QHS  . senna   2 tablet Oral QHS  . sodium chloride flush  10-40 mL Intracatheter Q12H  . vitamin C  250 mg Oral BID   Continuous Infusions: . sodium chloride    . piperacillin-tazobactam (ZOSYN)  IV Stopped (11/21/17 0200)  . vancomycin 750 mg (11/20/17 1533)    Assessment/Plan:  1. Pneumonia potentially aspiration started on vancomycin and Zosyn.   2. End-stage renal disease on hemodialysis as per nephrology.  Joshua [name] has [name] PermCath right chest.  Still awaiting outpatient dialysis slot. 3. Acute cardiac arrest secondary to polysubstance abuse 4. Acute rhabdomyolysis secondary to cardiac arrest and right leg compartment syndrome 5. Hyperkalemia secondary to acute kidney injury.  Managed with hemodialysis 6. Acute right lower extremity ischemia.  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 7. Chronic pain syndrome on oxycodone morphine and Neurontin 8. Acute on chronic anemia.  Received another unit of blood yesterday.  Hemoglobin 7.4 today.  On Aranesp and iron 9. Malnutrition on Ensure  Code Status:     Code Status Orders  (From admission, onward)        Start     Ordered   11/12/17 1637  Full code  Continuous     11/12/17 1636    Code Status History    Date Active Date Inactive Code Status Order ID Comments User Context   11/04/2017 1544 11/12/2017 1636 Partial Code [id number]  Morton Stall, NP Inpatient   10/30/2017 2110 11/04/2017 1544 Full Code [id number]  Lewie Loron, NP Inpatient   10/30/2017 1813 10/30/2017 2110 Full Code [id number]  Altamese Dilling, MD Inpatient      Disposition Plan: Hopefully this week can set up an outpatient dialysis slot and disco.  Consultants:  Nephrology  Vascular surgery  Antibiotics:  Vancomycin  Zosyn  Time spent: 26 minutes  Payten Beaumier [employer]

## 2017-11-21 NOTE — Progress Notes (Signed)
[facility]  ROUNDING NOTE   Subjective:  Patient ambulating on ward today. Overall appears to be feeling better. Had hemodialysis yesterday.   Objective:  Vital signs in last 24 hours:  Temp:  [98.4 F (36.9 C)-100.2 F (37.9 C)] 98.6 F (37 C) (03/30 1156) Pulse Rate:  [84-110] 107 (03/30 1156) Resp:  [12-20] 16 (03/30 1156) BP: (137-178)/(69-108) 137/69 (03/30 1156) SpO2:  [92 %-100 %] 92 % (03/30 1156) Weight:  [72.7 kg (160 lb 4.4 oz)] 72.7 kg (160 lb 4.4 oz) (03/29 1358)  Weight change:  Filed Weights   11/18/17 1015 11/20/17 0949 11/20/17 1358  Weight: 74.9 kg (165 lb 2 oz) 74.9 kg (165 lb 2 oz) 72.7 kg (160 lb 4.4 oz)    Intake/Output: I/O last 3 completed shifts: In: 890 [P.O.:150; Blood:740] Out: 3106 [Urine:1075; Other:2031]   Intake/Output this shift:  Total I/O In: -  Out: 375 [Urine:375]  Physical Exam: General: NAD, laying in the bed  Head: [place]/AT  Eyes: Anicteric  Neck: supple  Lungs:  clear to auscultation, normal effort  Heart: Regular, no rubs  Abdomen:  Soft, nontender, BS present  Extremities: Rt AKA, with Rt thigh edema, left leg edema  Neurologic: Alert, able to follow commands     Access: RIJ Permcath 11/16/17    Basic Metabolic Panel: Recent Labs  Lab 11/17/17 1107 11/20/17 0152  NA 136 136  K 5.5* 4.3  CL 99* 98*  CO2 28 26  GLUCOSE 94 112*  BUN 24* 29*  CREATININE 5.49* 6.55*  CALCIUM 7.9* 7.9*  PHOS  --  4.2    Liver Function Tests: No results for input(s): AST, ALT, ALKPHOS, BILITOT, PROT, ALBUMIN in the last 168 hours. No results for input(s): LIPASE, AMYLASE in the last 168 hours. No results for input(s): AMMONIA in the last 168 hours.  CBC: Recent Labs  Lab 11/14/17 1327 11/16/17 1545 11/20/17 0152 11/21/17 0431  WBC  --  13.2* 15.5* 10.9*  HGB 7.4* 7.5* 6.2* 7.4*  HCT 22.1* 22.3* 19.2* 22.5*  MCV  --  89.2 88.2 85.2  PLT  --  484* 498* 440    Cardiac Enzymes: Recent Labs  Lab  11/20/17 1000  CKTOTAL 236    BNP: Invalid input(s): POCBNP  CBG: No results for input(s): GLUCAP in the last 168 hours.  Microbiology: Results for orders placed or performed during the hospital encounter of 10/30/17  MRSA PCR Screening     Status: None   Collection Time: 10/30/17  7:54 PM  Result Value Ref Range Status   MRSA by PCR NEGATIVE NEGATIVE Final    Comment:        The GeneXpert MRSA Assay (FDA approved for NASAL specimens only), is one component of a comprehensive MRSA colonization surveillance program. It is not intended to diagnose MRSA infection nor to guide or monitor treatment for MRSA infections. Performed at [facility], [address] [place], [place] [address]   Culture, respiratory (NON-Expectorated)     Status: None   Collection Time: 11/05/17  8:17 AM  Result Value Ref Range Status   Specimen Description   Final    TRACHEAL ASPIRATE Performed at [facility], [address], [place], [place] [address]    Special Requests   Final    NONE Performed at [facility], [address] [place], [place] [address]    Gram Stain   Final    FEW WBC PRESENT, PREDOMINANTLY PMN MODERATE GRAM NEGATIVE RODS RARE  YEAST Performed at [facility], [address] [place], [place] [address]    Culture MODERATE ENTEROBACTER CLOACAE  Final   Report Status 11/08/2017 FINAL  Final   Organism ID, Bacteria ENTEROBACTER CLOACAE  Final      Susceptibility   Enterobacter cloacae - MIC*    CEFAZOLIN >=64 RESISTANT Resistant     CEFEPIME <=1 SENSITIVE Sensitive     CEFTAZIDIME <=1 SENSITIVE Sensitive     CEFTRIAXONE <=1 SENSITIVE Sensitive     CIPROFLOXACIN <=0.25 SENSITIVE Sensitive     GENTAMICIN <=1 SENSITIVE Sensitive     IMIPENEM <=0.25 SENSITIVE Sensitive     TRIMETH/SULFA <=20 SENSITIVE Sensitive     PIP/TAZO <=4 SENSITIVE Sensitive     * MODERATE ENTEROBACTER CLOACAE  Culture, blood (routine x 2)     Status: None    Collection Time: 11/09/17  8:49 PM  Result Value Ref Range Status   Specimen Description BLOOD DIALYSIS  Final   Special Requests   Final    BOTTLES DRAWN AEROBIC AND ANAEROBIC Blood Culture adequate volume   Culture   Final    NO GROWTH 5 DAYS Performed at [facility], [address] [place], [place] [address]    Report Status 11/14/2017 FINAL  Final    Coagulation Studies: No results for input(s): LABPROT, INR in the last 72 hours.  Urinalysis: No results for input(s): COLORURINE, LABSPEC, PHURINE, GLUCOSEU, HGBUR, BILIRUBINUR, KETONESUR, PROTEINUR, UROBILINOGEN, NITRITE, LEUKOCYTESUR in the last 72 hours.  Invalid input(s): APPERANCEUR    Imaging: Dg Chest 2 View  Result Date: 11/19/2017 CLINICAL DATA:  Shortness of breath EXAM: CHEST - 2 VIEW COMPARISON:  11/06/2017 FINDINGS: Removal of endotracheal and esophageal tubes. Right-sided central venous catheter tip overlies the SVC. Improved aeration of the lung bases. Borderline cardiomegaly. Mild vascular congestion. Focal airspace disease in the right upper and lower lobes. Small bilateral pleural effusion. IMPRESSION: 1. Removal of endotracheal and esophageal tubes. 2. Improved aeration at the bases. 3. Small bilateral pleural effusions. Mild perihilar interstitial opacity suspect for edema. More confluent airspace disease in the right upper and lower lobes of the lung may reflect superimposed pneumonia. Electronically Signed   By: Jasmine PangKim  Fujinaga M.D.   On: 11/19/2017 23:37     Medications:   . sodium chloride    . piperacillin-tazobactam (ZOSYN)  IV 3.375 g (11/21/17 1020)  . vancomycin 750 mg (11/20/17 1533)   . darbepoetin (ARANESP) injection - DIALYSIS  100 mcg Intravenous Q Wed-HD  . feeding supplement (ENSURE ENLIVE)  237 mL Oral TID BM  . ferrous sulfate  325 mg Oral BID WC  . gabapentin  100 mg Oral TID  . multivitamin  1 tablet Oral QHS  . multivitamin with minerals  1 tablet Oral Once per day on Mon Wed  Fri  . nicotine  21 mg Transdermal Daily  . QUEtiapine  25 mg Oral QHS  . senna  2 tablet Oral QHS  . sodium chloride flush  10-40 mL Intracatheter Q12H  . vitamin C  250 mg Oral BID   acetaminophen, ALPRAZolam, bisacodyl, dextrose, hydrALAZINE, ipratropium **OR** levalbuterol, morphine injection, naLOXone (NARCAN)  injection, nystatin cream, oxyCODONE-acetaminophen, sennosides, traMADol  Assessment/ Plan:  Joshua [name] is a 27 y.o. white male with asthma, bronchitis, polysubstance abuse, was admitted on3/8/2019after he was found down in a hotel for an unknown period of time in the fetal position. Hospital course includes prolonged CPR for 45 minutes in the emergency room. Ventricular fibrillation  arrest, hypothermia protocol.  Urine tox screen positive for cocaine and opiates.  Hospital course complicated by acute respiratory failure and acute renal failure. Started on CRRT 3/8. Underwent disarticulation of right lower extremity due to ischemic limb. Rhabdomyolysis with sustained CPK greater than 50,000 for several days.Self extubated on 3/15. Transitioned to intermittent hemodialysis on 3/16.   1.  End stage renal disease:  Cause Acute renal failure secondary to ATN, shock, hypotension and rhabdomyolysis, no recovery.   -Patient due for hemodialysis again on Monday.  2. Rhabdomyolysis: cocaine induced rhabdomyolysis.  CK was down to 236 yesterday.  3.  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 Gilda Crease on 3/12.   Underwent revision AKA 3/19 -Surgical wound management as per surgery.  4. LE edema -Patient had good ultrafiltration yesterday.  We will plan for additional ultrafiltration on Monday.  6.  Anemia of CKD.  Patient received blood transfusion yesterday.  Hemoglobin up to 7.4.  Maintain the patient on Aranesp.    LOS: 22 Shaneil Yazdi 3/30/201912:02 PM

## 2017-11-21 NOTE — Progress Notes (Signed)
Chaplain responded to an OR for an AD. Pt wanted an POA for other than Health Care. We were unable to complete.   11/21/17 1500  Clinical Encounter Type  Visited With Patient and family together  Visit Type Follow-up  Referral From Nurse  Spiritual Encounters  Spiritual Needs Brochure

## 2017-11-21 NOTE — Progress Notes (Signed)
[facility] admissions - patient is making good progress with therapies.  By the time he gets an outpatient HD slot, he may progress enough [facility].  I will continue to follow along for rehab needs.  Call me for questions.  #[phone]

## 2017-11-22 NOTE — Progress Notes (Signed)
PT Cancellation Note  Patient Details Name: Joshua [name] MRN: [id number] DOB: 12/20/1990   Cancelled Treatment:    Reason Eval/Treat Not Completed: Patient declined, no reason specified(family/friends visiting patient. Patient and visitors request another day/time. )  Patient present with family/friends. Patient request that patient be seen at later date/time due to just arriving to see patient.   Precious BardMarina Lunabelle Oatley, PT, DPT   11/22/2017, 3:24 PM

## 2017-11-22 NOTE — Progress Notes (Signed)
[facility]  ROUNDING NOTE   Subjective:  Patient resting comfortably in bed. He was able to ambulate with assistance of physical therapy and a walker yesterday. Due for dialysis again tomorrow.   Objective:  Vital signs in last 24 hours:  Temp:  [98.5 F (36.9 C)-99.8 F (37.7 C)] 98.5 F (36.9 C) (03/31 1235) Pulse Rate:  [95-110] 96 (03/31 1235) Resp:  [14-20] 14 (03/31 1235) BP: (139-141)/(81-94) 140/81 (03/31 1235) SpO2:  [92 %-97 %] 97 % (03/31 1235)  Weight change:  Filed Weights   11/18/17 1015 11/20/17 0949 11/20/17 1358  Weight: 74.9 kg (165 lb 2 oz) 74.9 kg (165 lb 2 oz) 72.7 kg (160 lb 4.4 oz)    Intake/Output: I/O last 3 completed shifts: In: 490 [P.O.:390; IV Piggyback:100] Out: 1575 [Urine:1575]   Intake/Output this shift:  Total I/O In: 240 [P.O.:240] Out: 500 [Urine:500]  Physical Exam: General: NAD, laying in the bed  Head: [place]/AT  Eyes: Anicteric  Neck: supple  Lungs:  clear to auscultation, normal effort  Heart: Regular, no rubs  Abdomen:  Soft, nontender, BS present  Extremities: Rt AKA, with Rt thigh edema, left leg edema  Neurologic: Alert, able to follow commands  Access: RIJ Permcath 11/16/17       Basic Metabolic Panel: Recent Labs  Lab 11/17/17 1107 11/20/17 0152  NA 136 136  K 5.5* 4.3  CL 99* 98*  CO2 28 26  GLUCOSE 94 112*  BUN 24* 29*  CREATININE 5.49* 6.55*  CALCIUM 7.9* 7.9*  PHOS  --  4.2    Liver Function Tests: No results for input(s): AST, ALT, ALKPHOS, BILITOT, PROT, ALBUMIN in the last 168 hours. No results for input(s): LIPASE, AMYLASE in the last 168 hours. No results for input(s): AMMONIA in the last 168 hours.  CBC: Recent Labs  Lab 11/16/17 1545 11/20/17 0152 11/21/17 0431  WBC 13.2* 15.5* 10.9*  HGB 7.5* 6.2* 7.4*  HCT 22.3* 19.2* 22.5*  MCV 89.2 88.2 85.2  PLT 484* 498* 440    Cardiac Enzymes: Recent Labs  Lab 11/20/17 1000  CKTOTAL 236    BNP: Invalid input(s):  POCBNP  CBG: No results for input(s): GLUCAP in the last 168 hours.  Microbiology: Results for orders placed or performed during the hospital encounter of 10/30/17  MRSA PCR Screening     Status: None   Collection Time: 10/30/17  7:54 PM  Result Value Ref Range Status   MRSA by PCR NEGATIVE NEGATIVE Final    Comment:        The GeneXpert MRSA Assay (FDA approved for NASAL specimens only), is one component of a comprehensive MRSA colonization surveillance program. It is not intended to diagnose MRSA infection nor to guide or monitor treatment for MRSA infections. Performed at [facility], [address] [place], [place] [address]   Culture, respiratory (NON-Expectorated)     Status: None   Collection Time: 11/05/17  8:17 AM  Result Value Ref Range Status   Specimen Description   Final    TRACHEAL ASPIRATE Performed at [facility], [address] [place], [place] [address]    Special Requests   Final    NONE Performed at [facility], [address] [place], [place] [address]    Gram Stain   Final    FEW WBC PRESENT, PREDOMINANTLY PMN MODERATE GRAM NEGATIVE RODS RARE YEAST Performed at [facility], [address], [place], [place] [address]    Culture MODERATE  ENTEROBACTER CLOACAE  Final   Report Status 11/08/2017 FINAL  Final   Organism ID, Bacteria ENTEROBACTER CLOACAE  Final      Susceptibility   Enterobacter cloacae - MIC*    CEFAZOLIN >=64 RESISTANT Resistant     CEFEPIME <=1 SENSITIVE Sensitive     CEFTAZIDIME <=1 SENSITIVE Sensitive     CEFTRIAXONE <=1 SENSITIVE Sensitive     CIPROFLOXACIN <=0.25 SENSITIVE Sensitive     GENTAMICIN <=1 SENSITIVE Sensitive     IMIPENEM <=0.25 SENSITIVE Sensitive     TRIMETH/SULFA <=20 SENSITIVE Sensitive     PIP/TAZO <=4 SENSITIVE Sensitive     * MODERATE ENTEROBACTER CLOACAE  Culture, blood (routine x 2)     Status: None   Collection Time: 11/09/17  8:49 PM  Result Value Ref Range  Status   Specimen Description BLOOD DIALYSIS  Final   Special Requests   Final    BOTTLES DRAWN AEROBIC AND ANAEROBIC Blood Culture adequate volume   Culture   Final    NO GROWTH 5 DAYS Performed at [facility], [address] [place], [place] [address]    Report Status 11/14/2017 FINAL  Final    Coagulation Studies: No results for input(s): LABPROT, INR in the last 72 hours.  Urinalysis: No results for input(s): COLORURINE, LABSPEC, PHURINE, GLUCOSEU, HGBUR, BILIRUBINUR, KETONESUR, PROTEINUR, UROBILINOGEN, NITRITE, LEUKOCYTESUR in the last 72 hours.  Invalid input(s): APPERANCEUR    Imaging: No results found.   Medications:   . piperacillin-tazobactam (ZOSYN)  IV 3.375 g (11/22/17 1049)  . vancomycin 750 mg (11/20/17 1533)   . darbepoetin (ARANESP) injection - DIALYSIS  100 mcg Intravenous Q Wed-HD  . feeding supplement (ENSURE ENLIVE)  237 mL Oral TID BM  . ferrous sulfate  325 mg Oral BID WC  . gabapentin  100 mg Oral TID  . multivitamin  1 tablet Oral QHS  . multivitamin with minerals  1 tablet Oral Once per day on Mon Wed Fri  . nicotine  21 mg Transdermal Daily  . QUEtiapine  25 mg Oral QHS  . senna  2 tablet Oral QHS  . sodium chloride flush  10-40 mL Intracatheter Q12H  . vitamin C  250 mg Oral BID   acetaminophen, ALPRAZolam, bisacodyl, dextrose, hydrALAZINE, ipratropium **OR** levalbuterol, morphine injection, naLOXone (NARCAN)  injection, nystatin cream, oxyCODONE-acetaminophen, sennosides, traMADol  Assessment/ Plan:  Joshua [name] is a 27 y.o. white male with asthma, bronchitis, polysubstance abuse, was admitted on3/8/2019after he was found down in a hotel for an unknown period of time in the fetal position. Hospital course includes prolonged CPR for 45 minutes in the emergency room. Ventricular fibrillation arrest, hypothermia protocol.  Urine tox screen positive for cocaine and opiates.  Hospital course complicated by acute respiratory  failure and acute renal failure. Started on CRRT 3/8. Underwent disarticulation of right lower extremity due to ischemic limb. Rhabdomyolysis with sustained CPK greater than 50,000 for several days.Self extubated on 3/15. Transitioned to intermittent hemodialysis on 3/16.   1.  End stage renal disease:  Cause Acute renal failure secondary to ATN, shock, hypotension and rhabdomyolysis, no recovery.   -We will plan for hemodialysis again tomorrow.  Outpatient dialysis planning ongoing.  Patient lives in [place].  2. Rhabdomyolysis: cocaine induced rhabdomyolysis.  CK normalized at 236.  3.  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 Gilda CreaseSchnier on 3/12.   Underwent revision AKA 3/19 -Surgical wound management as per surgery.  4. LE edema -Ultrafiltration  target of 2.5 kg tomorrow.  6.  Anemia of CKD.  Continue the patient on Aranesp.    LOS: 23 Joshua [name] 3/31/20191:27 PM

## 2017-11-22 NOTE — Progress Notes (Addendum)
Patient ID: Joshua [name], male   DOB: 11/13/1990, 27 y.o.   MRN: [id number]  [facility] PROGRESS NOTE  Joshua [name] WUJ:[id number] DOB: 05/29/1991 DOA: 10/30/2017 PCP: Patient, No Pcp Per  HPI/Subjective: Patient having a little pain at surgical site.  Otherwise feels okay.  Breathing okay.  Objective: Vitals:   11/22/17 0423 11/22/17 1235  BP: (!) 139/94 140/81  Pulse: 95 96  Resp: 16 14  Temp: 99.8 F (37.7 C) 98.5 F (36.9 C)  SpO2: 95% 97%    Filed Weights   11/18/17 1015 11/20/17 0949 11/20/17 1358  Weight: 74.9 kg (165 lb 2 oz) 74.9 kg (165 lb 2 oz) 72.7 kg (160 lb 4.4 oz)    ROS: Review of Systems  Constitutional: Negative for chills and fever.  Eyes: Negative for blurred vision.  Respiratory: Negative for cough and shortness of breath.   Cardiovascular: Negative for chest pain.  Gastrointestinal: Negative for abdominal pain, constipation, diarrhea, nausea and vomiting.  Genitourinary: Negative for dysuria.  Musculoskeletal: Positive for joint pain.  Neurological: Negative for dizziness and headaches.   Exam: Physical Exam  HENT:  Nose: No mucosal edema.  Mouth/Throat: No oropharyngeal exudate or posterior oropharyngeal edema.  Eyes: Pupils are equal, round, and reactive to light. Conjunctivae, EOM and lids are normal.  Neck: No JVD present. Carotid bruit is not present. No edema present. No thyroid mass and no thyromegaly present.  Cardiovascular: S1 normal and S2 normal. Exam reveals no gallop.  No murmur heard. Pulses:      Dorsalis pedis pulses are 2+ on the left side.  Respiratory: No respiratory distress. He has no wheezes. He has no rhonchi. He has no rales.  GI: Soft. Bowel sounds are normal. There is no tenderness.  Musculoskeletal:       Left ankle: He exhibits swelling.  Lymphadenopathy:    He has no cervical adenopathy.  Neurological: He is alert. No cranial nerve deficit.  Skin: Skin is warm. No rash noted. Nails show no clubbing.  AKA  amputation site looks clean and dry.  Staples still in.  Psychiatric: He has a normal mood and affect.      Data Reviewed: Basic Metabolic Panel: Recent Labs  Lab 11/17/17 1107 11/20/17 0152  NA 136 136  K 5.5* 4.3  CL 99* 98*  CO2 28 26  GLUCOSE 94 112*  BUN 24* 29*  CREATININE 5.49* 6.55*  CALCIUM 7.9* 7.9*  PHOS  --  4.2   CBC: Recent Labs  Lab 11/16/17 1545 11/20/17 0152 11/21/17 0431  WBC 13.2* 15.5* 10.9*  HGB 7.5* 6.2* 7.4*  HCT 22.3* 19.2* 22.5*  MCV 89.2 88.2 85.2  PLT 484* 498* 440   Cardiac Enzymes: Recent Labs  Lab 11/20/17 1000  CKTOTAL 236     Scheduled Meds: . darbepoetin (ARANESP) injection - DIALYSIS  100 mcg Intravenous Q Wed-HD  . feeding supplement (ENSURE ENLIVE)  237 mL Oral TID BM  . ferrous sulfate  325 mg Oral BID WC  . gabapentin  100 mg Oral TID  . multivitamin  1 tablet Oral QHS  . multivitamin with minerals  1 tablet Oral Once per day on Mon Wed Fri  . nicotine  21 mg Transdermal Daily  . QUEtiapine  25 mg Oral QHS  . senna  2 tablet Oral QHS  . sodium chloride flush  10-40 mL Intracatheter Q12H  . vitamin C  250 mg Oral BID   Continuous Infusions: . piperacillin-tazobactam (ZOSYN)  IV 3.375 g (  11/22/17 1049)  . vancomycin 750 mg (11/20/17 1533)    Assessment/Plan:  1. Pneumonia potentially aspiration started on vancomycin and Zosyn.   2. End-stage renal disease on hemodialysis as per nephrology.  Patient has a PermCath right chest.  Still awaiting outpatient dialysis slot. 3. Acute cardiac arrest secondary to polysubstance abuse 4. Acute rhabdomyolysis secondary to cardiac arrest and right leg compartment syndrome 5. Hyperkalemia secondary to acute kidney injury.  Managed with hemodialysis 6. Acute right lower extremity ischemia.  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 7. Chronic pain syndrome on oxycodone morphine and Neurontin 8. Acute on chronic anemia.  Last hemoglobin 7.4.  On Aranesp and iron.  Check hemoglobin and  type and screen tomorrow. 9. Malnutrition on Ensure  Code Status:     Code Status Orders  (From admission, onward)        Start     Ordered   11/12/17 1637  Full code  Continuous     11/12/17 1636    Code Status History    Date Active Date Inactive Code Status Order ID Comments User Context   11/04/2017 1544 11/12/2017 1636 Partial Code [id number]  Morton Stall, NP Inpatient   10/30/2017 2110 11/04/2017 1544 Full Code [id number]  Lewie Loron, NP Inpatient   10/30/2017 1813 10/30/2017 2110 Full Code [id number]  Altamese Dilling, MD Inpatient      Disposition Plan: Hopefully this week can set up an outpatient dialysis slot and disco.  Consultants:  Nephrology  Vascular surgery  Antibiotics:  Vancomycin  Zosyn  Time spent: 25 minutes  Kathryne Ramella [employer]

## 2017-11-23 LAB — TYPE AND SCREEN
ABO/RH(D): A POS
Antibody Screen: NEGATIVE
Unit division: 0
Unit division: 0

## 2017-11-23 LAB — BPAM RBC
Blood Product Expiration Date: 201904172359
Blood Product Expiration Date: 201904172359
ISSUE DATE / TIME: 201903291104
Unit Type and Rh: 6200
Unit Type and Rh: 6200

## 2017-11-23 LAB — RENAL FUNCTION PANEL
ANION GAP: 11 (ref 5–15)
Albumin: 2.3 g/dL — ABNORMAL LOW (ref 3.5–5.0)
BUN: 25 mg/dL — ABNORMAL HIGH (ref 6–20)
CALCIUM: 8.1 mg/dL — AB (ref 8.9–10.3)
CO2: 32 mmol/L (ref 22–32)
Chloride: 101 mmol/L (ref 101–111)
Creatinine, Ser: 5.38 mg/dL — ABNORMAL HIGH (ref 0.61–1.24)
GFR calc non Af Amer: 13 mL/min — ABNORMAL LOW (ref >60)
GFR, EST AFRICAN AMERICAN: 15 mL/min — AB (ref >60)
GLUCOSE: 102 mg/dL — AB (ref 65–99)
POTASSIUM: 2.9 mmol/L — AB (ref 3.5–5.1)
Phosphorus: 5.1 mg/dL — ABNORMAL HIGH (ref 2.5–4.6)
Sodium: 144 mmol/L (ref 135–145)

## 2017-11-23 LAB — HEMOGLOBIN: Hemoglobin: 7 g/dL — ABNORMAL LOW (ref 13.0–18.0)

## 2017-11-23 LAB — PREPARE RBC (CROSSMATCH)

## 2017-11-23 MED ORDER — SODIUM CHLORIDE 0.9 % IV SOLN: Freq: Once | INTRAVENOUS | Status: DC

## 2017-11-23 MED ORDER — AMLODIPINE BESYLATE 5 MG PO TABS
5.0000 mg | ORAL_TABLET | Freq: Every day | ORAL | Status: DC
  Administered 2017-11-23 – 2017-11-26 (×4): 5 mg via ORAL
  Filled 2017-11-23 (×6): qty 1

## 2017-11-23 MED ORDER — OXYCODONE HCL 5 MG PO TABS
10.0000 mg | ORAL_TABLET | ORAL | Status: DC | PRN
Start: 2017-11-23 — End: 2017-11-26
  Administered 2017-11-23 – 2017-11-26 (×17): 10 mg via ORAL
  Filled 2017-11-23 (×17): qty 2

## 2017-11-23 MED ORDER — OXYCODONE HCL 5 MG PO TABS
5.0000 mg | ORAL_TABLET | ORAL | Status: DC | PRN
  Administered 2017-11-23 – 2017-11-26 (×2): 5 mg via ORAL
  Filled 2017-11-23 (×2): qty 1

## 2017-11-23 NOTE — Progress Notes (Signed)
Blood Admin end    11/23/17 1315  Vitals  Temp 99 F (37.2 C)  Temp Source Oral  Pulse Rate 72  Resp 16  BP (!) 160/102  Oxygen Therapy  SpO2 99 %  O2 Device Room Air

## 2017-11-23 NOTE — Progress Notes (Addendum)
Patient ID: Joshua [name], male   DOB: 1990-12-16, 27 y.o.   MRN: [id number] [facility] PROGRESS NOTE  Joshua [name] DOB: 1990-09-18 DOA: 10/30/2017 PCP: Patient, No Pcp Per  HPI/Subjective: Patient still having some pain at the surgical site.  Patient feeling okay.  Breathing better.  Objective: Vitals:   11/23/17 1400 11/23/17 1415  BP: (!) 156/99 (!) 143/119  Pulse: 68 64  Resp: 15 18  Temp:    SpO2: 98% 99%    Filed Weights   11/18/17 1015 11/20/17 0949 11/20/17 1358  Weight: 74.9 kg (165 lb 2 oz) 74.9 kg (165 lb 2 oz) 72.7 kg (160 lb 4.4 oz)    ROS: Review of Systems  Constitutional: Negative for chills and fever.  Eyes: Negative for blurred vision.  Respiratory: Negative for cough and shortness of breath.   Cardiovascular: Negative for chest pain.  Gastrointestinal: Negative for abdominal pain, constipation, diarrhea, nausea and vomiting.  Genitourinary: Negative for dysuria.  Musculoskeletal: Positive for joint pain.  Neurological: Negative for dizziness and headaches.   Exam: Physical Exam  HENT:  Nose: No mucosal edema.  Mouth/Throat: No oropharyngeal exudate or posterior oropharyngeal edema.  Eyes: Pupils are equal, round, and reactive to light. Conjunctivae, EOM and lids are normal.  Neck: No JVD present. Carotid bruit is not present. No edema present. No thyroid mass and no thyromegaly present.  Cardiovascular: S1 normal and S2 normal. Exam reveals no gallop.  No murmur heard. Pulses:      Dorsalis pedis pulses are 2+ on the left side.  Respiratory: No respiratory distress. He has no wheezes. He has no rhonchi. He has no rales.  GI: Soft. Bowel sounds are normal. There is no tenderness.  Musculoskeletal:       Left ankle: He exhibits swelling.  Lymphadenopathy:    He has no cervical adenopathy.  Neurological: He is alert. No cranial nerve deficit.  Skin: Skin is warm. No rash noted. Nails show no clubbing.  AKA amputation site looks clean  and dry.  Staples still in.  Psychiatric: He has a normal mood and affect.      Data Reviewed: Basic Metabolic Panel: Recent Labs  Lab 11/17/17 1107 11/20/17 0152 11/23/17 1155  NA 136 136 144  K 5.5* 4.3 2.9*  CL 99* 98* 101  CO2 28 26 32  GLUCOSE 94 112* 102*  BUN 24* 29* 25*  CREATININE 5.49* 6.55* 5.38*  CALCIUM 7.9* 7.9* 8.1*  PHOS  --  4.2 5.1*   CBC: Recent Labs  Lab 11/16/17 1545 11/20/17 0152 11/21/17 0431 11/23/17 0425  WBC 13.2* 15.5* 10.9*  --   HGB 7.5* 6.2* 7.4* 7.0*  HCT 22.3* 19.2* 22.5*  --   MCV 89.2 88.2 85.2  --   PLT 484* 498* 440  --    Cardiac Enzymes: Recent Labs  Lab 11/20/17 1000  CKTOTAL 236     Scheduled Meds: . darbepoetin (ARANESP) injection - DIALYSIS  100 mcg Intravenous Q Wed-HD  . feeding supplement (ENSURE ENLIVE)  237 mL Oral TID BM  . ferrous sulfate  325 mg Oral BID WC  . gabapentin  100 mg Oral TID  . multivitamin  1 tablet Oral QHS  . multivitamin with minerals  1 tablet Oral Once per day on Mon Wed Fri  . nicotine  21 mg Transdermal Daily  . QUEtiapine  25 mg Oral QHS  . senna  2 tablet Oral QHS  . sodium chloride flush  10-40 mL Intracatheter  Q12H  . vitamin C  250 mg Oral BID   Continuous Infusions: . sodium chloride    . piperacillin-tazobactam (ZOSYN)  IV Stopped (11/23/17 0116)    Assessment/Plan:  1. Pneumonia potentially aspiration started on Zosyn.  Acute hypoxic respiratory failure has improved.  Patient off oxygen today. 2. End-stage renal disease on hemodialysis as per nephrology.  Patient has a PermCath right chest.  Still awaiting outpatient dialysis slot. 3. Acute cardiac arrest secondary to polysubstance abuse 4. Acute rhabdomyolysis secondary to cardiac arrest and right leg compartment syndrome 5. Hyperkalemia secondary to acute kidney injury.  Managed with hemodialysis. 6. Acute right lower extremity ischemia.  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 7. Chronic pain syndrome on oxycodone and  Neurontin 8. Acute on chronic anemia.  Last hemoglobin 7.0.  On Aranesp and iron.  Transfuse 1 unit of packed red blood cells today with dialysis. 9. Malnutrition on Ensure 10. Essential hypertension start low-dose Norvasc at night  Code Status:     Code Status Orders  (From admission, onward)        Start     Ordered   11/12/17 1637  Full code  Continuous     11/12/17 1636    Code Status History    Date Active Date Inactive Code Status Order ID Comments User Context   11/04/2017 1544 11/12/2017 1636 Partial Code [id number]  Morton StallGriffin, Crystal, NP Inpatient   10/30/2017 2110 11/04/2017 1544 Full Code [id number]  Lewie Loronukov, Magadalene S, NP Inpatient   10/30/2017 1813 10/30/2017 2110 Full Code [id number]  Altamese DillingVachhani, Vaibhavkumar, MD Inpatient      Disposition Plan: Hopefully this week can set up an outpatient dialysis slot and home with the patient's sister and [place]  Consultants:  Nephrology  Vascular surgery  Antibiotics:  Zosyn  Time spent: 26 minutes Case discussed with nephrology.    Joshua [name]  [facility]

## 2017-11-23 NOTE — Progress Notes (Signed)
Pre HD assessment    11/23/17 1100  Neurological  Level of Consciousness Alert  Orientation Level Oriented X4  Respiratory  Respiratory Pattern Regular;Unlabored  Chest Assessment Chest expansion symmetrical  Cardiac  ECG Monitor Yes  Vascular  R Radial Pulse +2  L Radial Pulse +2  Integumentary  Integumentary (WDL) X  Skin Color Appropriate for ethnicity  Musculoskeletal  Musculoskeletal (WDL) X  Generalized Weakness Yes  Assistive Device None  GU Assessment  Genitourinary (WDL) X  Genitourinary Symptoms  (HD)  Psychosocial  Psychosocial (WDL) WDL  Patient Behaviors Cooperative;Calm;Appropriate for situation  Emotional support given Given to patient

## 2017-11-23 NOTE — Progress Notes (Signed)
Post HD assessment   11/23/17 1500  Neurological  Level of Consciousness Alert  Orientation Level Oriented X4  Respiratory  Respiratory Pattern Regular;Unlabored  Chest Assessment Chest expansion symmetrical  Cardiac  ECG Monitor Yes  Vascular  R Radial Pulse +2  L Radial Pulse +2  Integumentary  Integumentary (WDL) X  Skin Color Appropriate for ethnicity  Musculoskeletal  Musculoskeletal (WDL) X  Generalized Weakness Yes  Assistive Device None  GU Assessment  Genitourinary (WDL) X  Genitourinary Symptoms  (HD)  Psychosocial  Psychosocial (WDL) WDL  Emotional support given Given to patient

## 2017-11-23 NOTE — Progress Notes (Signed)
Physical Therapy Treatment Patient Details Name: Joshua [name] MRN: [id number] DOB: 11-17-1990 Today's Date: 11/23/2017    History of Present Illness 27yo male pt presented to ER after being found unresponsive, down for unknown period of time, related to ingestion of controlled substances.  Patient suffered vfib arrest en route and underwent 45 minutes CPR; subsequently transitioned to hypothermia cooling protocol.  Remained intubated and sedated until self-extubation 3/15. UDS positive for cocaine and opiates.  Hospital course additionally significant for severe rhabomyolisis with ischemic R LE (status post emergent knee disarticulation 3/12 with revision to R AKA 3/15); initiation of CRRT due to renal failure, now transitioned to temporary dialysis via R IJ, scheduled for conversion to permcath with HD next date.       PT Comments    Pt agreeable to PT although reports 10/10 pain RLE and general malaise post dialysis. Pt notes temp of 99.5. Pt participates in limited supine exercises currently; pt states he is able to and has performed more when feeling better. Continue PT to progress out of bed participation to progress/improve strength, endurance and functional mobility.    Follow Up Recommendations  CIR     Equipment Recommendations       Recommendations for Other Services       Precautions / Restrictions Precautions Precautions: Fall Restrictions Weight Bearing Restrictions: Yes RLE Weight Bearing: Non weight bearing    Mobility  Bed Mobility               General bed mobility comments: Not tested  Transfers                    Ambulation/Gait                 Stairs            Wheelchair Mobility    Modified Rankin (Stroke Patients Only)       Balance                                            Cognition Arousal/Alertness: Lethargic Behavior During Therapy: WFL for tasks assessed/performed Overall Cognitive Status:  Within Functional Limits for tasks assessed                                        Exercises General Exercises - Lower Extremity Ankle Circles/Pumps: AROM Quad Sets: Left;10 reps;Supine Gluteal Sets: Strengthening;Both;10 reps;Supine Short Arc Quad: AROM;Left;10 reps;Supine Heel Slides: AROM;Left;10 reps;Supine Hip ABduction/ADduction: AROM;Right;10 reps;Supine Straight Leg Raises: AROM;Right;10 reps;Supine    General Comments        Pertinent Vitals/Pain Pain Assessment: 0-10 Pain Score: 10-Worst pain ever Pain Location: R residual limb    Home Living                      Prior Function            PT Goals (current goals can now be found in the care plan section) Progress towards PT goals: PT to reassess next treatment    Frequency    7X/week      PT Plan Current plan remains appropriate    Co-evaluation              AM-PAC PT "6 Clicks" Daily Activity  Outcome  Measure  Difficulty turning over in bed (including adjusting bedclothes, sheets and blankets)?: None Difficulty moving from lying on back to sitting on the side of the bed? : A Little Difficulty sitting down on and standing up from a chair with arms (e.g., wheelchair, bedside commode, etc,.)?: A Little Help needed moving to and from a bed to chair (including a wheelchair)?: A Little Help needed walking in hospital room?: A Lot Help needed climbing 3-5 steps with a railing? : Total 6 Click Score: 16    End of Session   Activity Tolerance: Patient limited by fatigue;Other (comment)(malaise) Patient left: in bed;with bed alarm set;with call bell/phone within reach   PT Visit Diagnosis: Difficulty in walking, not elsewhere classified (R26.2);Muscle weakness (generalized) (M62.81)     Time: [id number] PT Time Calculation (min) (ACUTE ONLY): 18 min  Charges:  $Therapeutic Exercise: 8-22 mins                    G Codes:        Scot DockHeidi E Hiram Mciver, PTA 11/23/2017, 5:05  PM

## 2017-11-23 NOTE — Progress Notes (Signed)
Pre   11/23/17 1101  Vital Signs  Temp 99.5 F (37.5 C)  Temp Source Oral  Pulse Rate 71  Pulse Rate Source Monitor  Resp 12  BP (!) 156/91  BP Location Right Arm  BP Method Automatic  Patient Position (if appropriate) Sitting  Oxygen Therapy  SpO2 98 %  O2 Device Room Air  Pain Assessment  Pain Scale 0-10  Pain Score Asleep  Dialysis Weight  Weight  (unable to weigh pt, in chair, no scale available)  Type of Weight Pre-Dialysis  Time-Out for Hemodialysis  What Procedure? HD  Pt Identifiers(min of two) First/Last Name;MRN/Account#  Correct Site? Yes  Correct Side? Yes  Correct Procedure? Yes  Consents Verified? Yes  Rad Studies Available? N/A  Safety Precautions Reviewed? Yes  Biochemist, clinicalMachine Checks  Machine Number  (2A)  Station Number 4  UF/Alarm Test Passed  Conductivity: Meter 14  Conductivity: Machine  14.1  pH 7.6  Reverse Osmosis main  Normal Saline Lot Number [id number]  Dialyzer Lot Number [id number]  Disposable Set Lot Number [id number]  Machine Temperature 98.6 F (37 C)  ImmunologistAir Detector Armed and Audible Yes  Blood Lines Intact and Secured Yes  Pre Treatment Patient Checks  Vascular access used during treatment Catheter  Patient is receiving dialysis in a chair Yes  Hepatitis B Surface Antigen Results Negative  Date Hepatitis B Surface Antigen Drawn 11/07/17  Hepatitis B Surface Antibody  (<10)  Date Hepatitis B Surface Antibody Drawn 11/07/17  Hemodialysis Consent Verified Yes  Hemodialysis Standing Orders Initiated Yes  ECG (Telemetry) Monitor On Yes  Prime Ordered Normal Saline  Length of  DialysisTreatment -hour(s) 3.5 Hour(s)  Dialyzer Elisio 17H NR  Dialysate 2K, 2.5 Ca  Dialysis Anticoagulant None  Dialysate Flow Ordered 800  Blood Flow Rate Ordered 400 mL/min  Ultrafiltration Goal 2.5 Liters  Pre Treatment Labs Renal panel;Phosphorus  Blood Products Ordered Packed Red Blood Cells  Dialysis Blood Pressure Support Ordered Normal Saline  Education /  Care Plan  Dialysis Education Provided Yes  Documented Education in Care Plan Yes  Hemodialysis Catheter Right Subclavian Double-lumen  Placement Date/Time: 11/16/17 1655   Time Out: Correct patient;Correct site;Correct procedure  Maximum sterile barrier precautions: Hand hygiene;Sterile gloves;Cap;Large sterile sheet;Mask;Sterile gown  Site Prep: Chlorhexidine;Betadine  Local Anesthe...  Site Condition No complications  Blue Lumen Status Heparin locked  Red Lumen Status Heparin locked  Purple Lumen Status N/A  Dressing Type Biopatch  Dressing Status Clean;Dry;Intact  Drainage Description None  HD assessment

## 2017-11-23 NOTE — Progress Notes (Signed)
[facility]  ROUNDING NOTE   Subjective:   Seen and examined on hemodialysis. Tolerating treatment well.  UOP 2330mL.     HEMODIALYSIS FLOWSHEET:  Blood Flow Rate (mL/min): 400 mL/min Arterial Pressure (mmHg): -180 mmHg Venous Pressure (mmHg): 100 mmHg Transmembrane Pressure (mmHg): 30 mmHg Ultrafiltration Rate (mL/min): 1010 mL/min Dialysate Flow Rate (mL/min): 800 ml/min Conductivity: Machine : 14.1 Conductivity: Machine : 14.1 Dialysis Fluid Bolus: Blood Products Bolus Amount (mL): 250 mL     Objective:  Vital signs in last 24 hours:  Temp:  [99 F (37.2 C)-100.1 F (37.8 C)] 99 F (37.2 C) (04/01 1315) Pulse Rate:  [64-100] 64 (04/01 1415) Resp:  [12-25] 18 (04/01 1415) BP: (131-160)/(75-119) 143/119 (04/01 1415) SpO2:  [79 %-100 %] 99 % (04/01 1415)  Weight change:  Filed Weights   11/18/17 1015 11/20/17 0949 11/20/17 1358  Weight: 74.9 kg (165 lb 2 oz) 74.9 kg (165 lb 2 oz) 72.7 kg (160 lb 4.4 oz)    Intake/Output: I/O last 3 completed shifts: In: 620 [P.O.:480; IV Piggyback:140] Out: 2730 [Urine:2730]   Intake/Output this shift:  Total I/O In: 710 [P.O.:360; Blood:350] Out: 250 [Urine:250]  Physical Exam: General: NAD, laying in the bed  Head: Joshua [name]/AT  Eyes: Anicteric  Neck: supple  Lungs:  clear to auscultation, normal effort  Heart: Regular, no rubs  Abdomen:  Soft, nontender  Extremities: Rt AKA  Neurologic: Alert, able to follow commands  Access: RIJ Permcath 11/16/17       Basic Metabolic Panel: Recent Labs  Lab 11/17/17 1107 11/20/17 0152 11/23/17 1155  NA 136 136 144  K 5.5* 4.3 2.9*  CL 99* 98* 101  CO2 28 26 32  GLUCOSE 94 112* 102*  BUN 24* 29* 25*  CREATININE 5.49* 6.55* 5.38*  CALCIUM 7.9* 7.9* 8.1*  PHOS  --  4.2 5.1*    Liver Function Tests: Recent Labs  Lab 11/23/17 1155  ALBUMIN 2.3*   No results for input(s): LIPASE, AMYLASE in the last 168 hours. No results for input(s): AMMONIA in the last  168 hours.  CBC: Recent Labs  Lab 11/16/17 1545 11/20/17 0152 11/21/17 0431 11/23/17 0425  WBC 13.2* 15.5* 10.9*  --   HGB 7.5* 6.2* 7.4* 7.0*  HCT 22.3* 19.2* 22.5*  --   MCV 89.2 88.2 85.2  --   PLT 484* 498* 440  --     Cardiac Enzymes: Recent Labs  Lab 11/20/17 1000  CKTOTAL 236    BNP: Invalid input(s): POCBNP  CBG: No results for input(s): GLUCAP in the last 168 hours.  Microbiology: Results for orders placed or performed during the hospital encounter of 10/30/17  MRSA PCR Screening     Status: None   Collection Time: 10/30/17  7:54 PM  Result Value Ref Range Status   MRSA by PCR NEGATIVE NEGATIVE Final    Comment:        The GeneXpert MRSA Assay (FDA approved for NASAL specimens only), is one component of a comprehensive MRSA colonization surveillance program. It is not intended to diagnose MRSA infection nor to guide or monitor treatment for MRSA infections. Performed at [facility], [address] [place], [place] [address]   Culture, respiratory (NON-Expectorated)     Status: None   Collection Time: 11/05/17  8:17 AM  Result Value Ref Range Status   Specimen Description   Final    TRACHEAL ASPIRATE Performed at [facility], [address] [place], [place] [address]  Special Requests   Final    NONE Performed at [facility], [address] [place], [place] [address]    Gram Stain   Final    FEW WBC PRESENT, PREDOMINANTLY PMN MODERATE GRAM NEGATIVE RODS RARE YEAST Performed at [facility], [address], [place], [place] [address]    Culture MODERATE ENTEROBACTER CLOACAE  Final   Report Status 11/08/2017 FINAL  Final   Organism ID, Bacteria ENTEROBACTER CLOACAE  Final      Susceptibility   Enterobacter cloacae - MIC*    CEFAZOLIN >=64 RESISTANT Resistant     CEFEPIME <=1 SENSITIVE Sensitive     CEFTAZIDIME <=1 SENSITIVE Sensitive     CEFTRIAXONE <=1 SENSITIVE Sensitive     CIPROFLOXACIN  <=0.25 SENSITIVE Sensitive     GENTAMICIN <=1 SENSITIVE Sensitive     IMIPENEM <=0.25 SENSITIVE Sensitive     TRIMETH/SULFA <=20 SENSITIVE Sensitive     PIP/TAZO <=4 SENSITIVE Sensitive     * MODERATE ENTEROBACTER CLOACAE  Culture, blood (routine x 2)     Status: None   Collection Time: 11/09/17  8:49 PM  Result Value Ref Range Status   Specimen Description BLOOD DIALYSIS  Final   Special Requests   Final    BOTTLES DRAWN AEROBIC AND ANAEROBIC Blood Culture adequate volume   Culture   Final    NO GROWTH 5 DAYS Performed at [facility], [address], [place], [place] [address]    Report Status 11/14/2017 FINAL  Final    Coagulation Studies: No results for input(s): LABPROT, INR in the last 72 hours.  Urinalysis: No results for input(s): COLORURINE, LABSPEC, PHURINE, GLUCOSEU, HGBUR, BILIRUBINUR, KETONESUR, PROTEINUR, UROBILINOGEN, NITRITE, LEUKOCYTESUR in the last 72 hours.  Invalid input(s): APPERANCEUR    Imaging: No results found.   Medications:   . sodium chloride    . piperacillin-tazobactam (ZOSYN)  IV Stopped (11/23/17 0116)   . darbepoetin (ARANESP) injection - DIALYSIS  100 mcg Intravenous Q Wed-HD  . feeding supplement (ENSURE ENLIVE)  237 mL Oral TID BM  . ferrous sulfate  325 mg Oral BID WC  . gabapentin  100 mg Oral TID  . multivitamin  1 tablet Oral QHS  . multivitamin with minerals  1 tablet Oral Once per day on Mon Wed Fri  . nicotine  21 mg Transdermal Daily  . QUEtiapine  25 mg Oral QHS  . senna  2 tablet Oral QHS  . sodium chloride flush  10-40 mL Intracatheter Q12H  . vitamin C  250 mg Oral BID   acetaminophen, ALPRAZolam, bisacodyl, dextrose, hydrALAZINE, ipratropium **OR** levalbuterol, naLOXone (NARCAN)  injection, nystatin cream, oxyCODONE, oxyCODONE, sennosides  Assessment/ Plan:  [name] is a 27 y.o. white male with asthma, bronchitis, polysubstance abuse, was admitted on3/8/2019after he was found down in a hotel for  an unknown period of time in the fetal position. Hospital course includes prolonged CPR for 45 minutes in the emergency room. Ventricular fibrillation arrest, hypothermia protocol.  Urine tox screen positive for cocaine and opiates.  Hospital course complicated by acute respiratory failure and acute renal failure. Started on CRRT 3/8. Underwent disarticulation of right lower extremity due to ischemic limb. Rhabdomyolysis with sustained CPK greater than 50,000 for several days.Self extubated on 3/15. Transitioned to intermittent hemodialysis on 3/16.   1.  End stage renal disease:  Cause Acute renal failure secondary to ATN, shock, hypotension and rhabdomyolysis, no recovery. - Urine output of 2.3 liters. Will hold  dialysis for next two days and monitor urine output, renal function, volume status and serum electrolytes.  - Outpatient planning for [facility].   2. Rhabdomyolysis: cocaine induced rhabdomyolysis.     3.  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 Gilda Crease on 3/12.   Underwent revision AKA 3/19  4.  Anemia of CKD: hemoglobin 7. PRBC transfusion for today.  -  Continue Aranesp.  5. Hypertension: elevated. Pain driven.  - amlodipine ordered.     LOS: 24 Antionette Luster 4/1/20192:20 PM

## 2017-11-23 NOTE — Care Management (Signed)
Patient has been up to the chair in room.  Patient currently off the floor to HD in chair.

## 2017-11-23 NOTE — Progress Notes (Signed)
Blood admin start   11/23/17 1215  Pre-Transfusion Documentation  Blood Consent Obtained Yes  History of previous reaction? No  Pre-Meds Given? No  Patient education Done  Vitals  Vital Signs Type (Include Temp, Pulse, RR, and B/P) Pre-blood (within 30 minutes)  Temp 99.4 F (37.4 C)  Temp Source Oral  Pulse Rate 98  ECG Heart Rate 98  Resp (!) 24  BP (!) 159/98  Oxygen Therapy  SpO2 97 %  O2 Device Room Air  Transfuse RBC  Volume 350  Blood Admin Supplies Y set with filter

## 2017-11-23 NOTE — Progress Notes (Signed)
HD tx start   11/23/17 1106  Vital Signs  Pulse Rate 89  Pulse Rate Source Monitor  Resp 12  BP (!) 147/96  BP Location Right Arm  BP Method Automatic  Patient Position (if appropriate) Lying  Oxygen Therapy  SpO2 99 %  O2 Device Room Air  During Hemodialysis Assessment  Blood Flow Rate (mL/min) 400 mL/min  Arterial Pressure (mmHg) -150 mmHg  Venous Pressure (mmHg) 120 mmHg  Transmembrane Pressure (mmHg) 70 mmHg  Ultrafiltration Rate (mL/min) 860 mL/min  Dialysate Flow Rate (mL/min) 800 ml/min  Conductivity: Machine  14.1  HD Safety Checks Performed Yes  Dialysis Fluid Bolus Normal Saline  Bolus Amount (mL) 250 mL  Intra-Hemodialysis Comments Tx initiated  Hemodialysis Catheter Right Subclavian Double-lumen  Placement Date/Time: 11/16/17 1655   Time Out: Correct patient;Correct site;Correct procedure  Maximum sterile barrier precautions: Hand hygiene;Sterile gloves;Cap;Large sterile sheet;Mask;Sterile gown  Site Prep: Chlorhexidine;Betadine  Local Anesthe...  Blue Lumen Status Infusing  Red Lumen Status Infusing

## 2017-11-23 NOTE — Progress Notes (Signed)
HD tx end   11/23/17 1449  Vital Signs  Pulse Rate 100  Pulse Rate Source Monitor  Resp 20  BP (!) 156/103  BP Location Right Arm  BP Method Automatic  Patient Position (if appropriate) Sitting  Oxygen Therapy  SpO2 98 %  O2 Device Room Air  During Hemodialysis Assessment  Dialysis Fluid Bolus Normal Saline  Bolus Amount (mL) 250 mL  Intra-Hemodialysis Comments Tx completed

## 2017-11-23 NOTE — Progress Notes (Signed)
Post HD assessment. Pt tolerated tx well without c/o or complications. Net UF 2517, goal met. Pt received 1 unit of blood during HD tx.    11/23/17 1456  Vital Signs  Temp 99.6 F (37.6 C)  Temp Source Oral  Pulse Rate 93  Pulse Rate Source Monitor  Resp 16  BP (!) 164/88  BP Location Right Arm  BP Method Automatic  Patient Position (if appropriate) Sitting  Oxygen Therapy  SpO2 100 %  O2 Device Room Air  Dialysis Weight  Weight  (unable to weigh pt in chair, no scale available)  Type of Weight Post-Dialysis  Post-Hemodialysis Assessment  Rinseback Volume (mL) 250 mL  KECN 78.3 V  Dialyzer Clearance Lightly streaked  Duration of HD Treatment -hour(s) 3.5 hour(s)  Hemodialysis Intake (mL) 850 mL  UF Total -Machine (mL) 3367 mL  Net UF (mL) 2517 mL  Tolerated HD Treatment Yes  Education / Care Plan  Dialysis Education Provided Yes  Documented Education in Care Plan Yes  Hemodialysis Catheter Right Subclavian Double-lumen  Placement Date/Time: 11/16/17 1655   Time Out: Correct patient;Correct site;Correct procedure  Maximum sterile barrier precautions: Hand hygiene;Sterile gloves;Cap;Large sterile sheet;Mask;Sterile gown  Site Prep: Chlorhexidine;Betadine  Local Anesthe...  Site Condition No complications  Blue Lumen Status Heparin locked  Red Lumen Status Heparin locked  Purple Lumen Status N/A  Catheter fill solution Heparin 1000 units/ml  Catheter fill volume (Arterial) 1.7 cc  Catheter fill volume (Venous) 1.7  Dressing Type Biopatch  Dressing Status Clean;Dry;Intact  Dressing Change Due 12/26/17  Post treatment catheter status Capped and Clamped

## 2017-11-23 NOTE — Progress Notes (Signed)
PT Cancellation Note  Patient Details Name: Joshua HubertMark A Wenger MRN: [id number] DOB: 12/03/1990   Cancelled Treatment:    Reason Eval/Treat Not Completed: Patient at procedure or test/unavailable. Pt currently at hemodialysis. Re attempt later today.    Scot DockHeidi E Barnes, PTA 11/23/2017, 12:29 PM

## 2017-11-24 LAB — BPAM RBC
BLOOD PRODUCT EXPIRATION DATE: 201904172359
ISSUE DATE / TIME: 201904011201
Unit Type and Rh: 6200

## 2017-11-24 LAB — TYPE AND SCREEN
ABO/RH(D): A POS
Antibody Screen: NEGATIVE
UNIT DIVISION: 0

## 2017-11-24 LAB — RENAL FUNCTION PANEL
ALBUMIN: 2.4 g/dL — AB (ref 3.5–5.0)
Anion gap: 10 (ref 5–15)
BUN: 13 mg/dL (ref 6–20)
CALCIUM: 7.9 mg/dL — AB (ref 8.9–10.3)
CO2: 33 mmol/L — ABNORMAL HIGH (ref 22–32)
CREATININE: 2.95 mg/dL — AB (ref 0.61–1.24)
Chloride: 100 mmol/L — ABNORMAL LOW (ref 101–111)
GFR calc Af Amer: 32 mL/min — ABNORMAL LOW (ref >60)
GFR, EST NON AFRICAN AMERICAN: 27 mL/min — AB (ref >60)
GLUCOSE: 89 mg/dL (ref 65–99)
Phosphorus: 3.8 mg/dL (ref 2.5–4.6)
Potassium: 2.6 mmol/L — CL (ref 3.5–5.1)
SODIUM: 143 mmol/L (ref 135–145)

## 2017-11-24 LAB — HEMOGLOBIN: HEMOGLOBIN: 8.2 g/dL — AB (ref 13.0–18.0)

## 2017-11-24 MED ORDER — POTASSIUM CHLORIDE CRYS ER 20 MEQ PO TBCR
60.0000 meq | EXTENDED_RELEASE_TABLET | Freq: Once | ORAL | Status: AC
  Administered 2017-11-24: 60 meq via ORAL
  Filled 2017-11-24: qty 3

## 2017-11-24 MED ORDER — POTASSIUM CHLORIDE CRYS ER 20 MEQ PO TBCR
40.0000 meq | EXTENDED_RELEASE_TABLET | Freq: Once | ORAL | Status: AC
  Administered 2017-11-24: 40 meq via ORAL
  Filled 2017-11-24: qty 2

## 2017-11-24 MED ORDER — AMOXICILLIN-POT CLAVULANATE 500-125 MG PO TABS
1.0000 | ORAL_TABLET | Freq: Every day | ORAL | Status: DC
  Administered 2017-11-24: 500 mg via ORAL
  Filled 2017-11-24: qty 1

## 2017-11-24 NOTE — Progress Notes (Signed)
Patient ID: Levonne HubertMark A Rembold, male   DOB: 09/14/1990, 27 y.o.   MRN: [id number]  ACP note  Patient and sister at bedside  Diagnosis: Acute cardiac arrest secondary to polysubstance abuse, end-stage renal disease, aspiration pneumonia, acute rhabdomyolysis, hyperkalemia, acute right lower extremity ischemia, chronic pain, acute on chronic anemia, malnutrition, essential hypertension.  Discussion of how lucky he was to survive this hospital stay.  I advised that he must stop all substances.  I advised that we have to start cutting back on pain medications because I am limited on what I can prescribe at going home.  I stopped IV pain medications yesterday.  I will spread out the timeframe on when he can get his pain medications.  His sister will be in control of his pain medications once he is out of the hospital.  Nephrology to hold on dialysis and see if his kidney function improves or worsens.  He is making urine.  Still waiting on outpatient dialysis slot.  We will not hear back until Thursday.  Time spent on ACP discussion 20 minutes  Dr. Alford Highlandichard Latiqua Daloia

## 2017-11-24 NOTE — Care Management (Signed)
Per [name] HD liaison outpatient HD scheduled can not be confirmed until Thursday at the earliest.  Per Dr. Wynelle LinkKolluru if patient discharges to home this HD facility can accommodate Saturday new starts.  Patient febrile yesterday and low grade fever over night.

## 2017-11-24 NOTE — Progress Notes (Signed)
Occupational Therapy Treatment Patient Details Name: Joshua [name] MRN: [id number] DOB: 16-Jun-1991 Today's Date: 11/24/2017    History of present illness 27yo male pt presented to ER after being found unresponsive, down for unknown period of time, related to ingestion of controlled substances.  Patient suffered vfib arrest en route and underwent 45 minutes CPR; subsequently transitioned to hypothermia cooling protocol.  Remained intubated and sedated until self-extubation 3/15. UDS positive for cocaine and opiates.  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   OT comments  Pt seen for OT treatment focused on increasing activity tolerance for self care tasks and residual limb care. Pt noting 7/10 pain in residual RLE with increased sensitivity to light touch. Pt instructed in desensitization techniques to support pain relief and for in preparation for prosthesis wear eventually, pt verbalized understanding, would benefit from reinforcement to support recall and carry over. Pt performed bed mobility with modified independence, required 1 verbal cue for hand placement during transfers with the RW, and CGA during grooming tasks while standing at the sink with no overt LOB noted. Pt continues to benefit from skilled OT services to address impairments and support pt in maximal return to PLOF, minimizing falls risk, and minimizing caregiver burden. Continue to recommend CIR.   Follow Up Recommendations  CIR    Equipment Recommendations       Recommendations for Other Services      Precautions / Restrictions Precautions Precautions: Fall Precaution Comments: pressure area to L heel, R IJ perm-cath Restrictions Weight Bearing Restrictions: Yes RLE Weight Bearing: Non weight bearing Other Position/Activity  Restrictions: R AKA       Mobility Bed Mobility Overal bed mobility: Modified Independent Bed Mobility: Supine to Sit;Sit to Supine     Supine to sit: Modified independent (Device/Increase time) Sit to supine: Modified independent (Device/Increase time)      Transfers Overall transfer level: Needs assistance Equipment used: Rolling walker (2 wheeled) Transfers: Sit to/from Stand Sit to Stand: Min guard;Min assist         General transfer comment: VC for hand placement to improve safety    Balance Overall balance assessment: Needs assistance Sitting-balance support: No upper extremity supported;Feet supported Sitting balance-Leahy Scale: Good     Standing balance support: Bilateral upper extremity supported;During functional activity Standing balance-Leahy Scale: Fair                             ADL either performed or assessed with clinical judgement   ADL Overall ADL's : Needs assistance/impaired Eating/Feeding: Sitting;Independent   Grooming: Standing;Min guard;Wash/dry face;Wash/dry hands;Brushing hair Grooming Details (indicate cue type and reason): Able to tolerate standing at the sink to rinse his hair and apply gel and was his hands, using B forearms to rest on sink counter for additional support. No LOB.                                     Vision Patient Visual Report: No change from baseline     Perception     Praxis      Cognition Arousal/Alertness: Awake/alert Behavior During Therapy: WFL for tasks assessed/performed Overall Cognitive Status: Within Functional Limits for tasks assessed  Exercises Other Exercises Other Exercises: Pt instructed in desensitization techniques to support pain relief and for in preparation for prosthesis wear eventually, pt verbalized understanding, would benefit from reinforcement to support recall and carry over   Shoulder  Instructions       General Comments      Pertinent Vitals/ Pain       Pain Assessment: 0-10 Pain Score: 7  Pain Location: R residual limb Pain Descriptors / Indicators: Aching;Constant Pain Intervention(s): Limited activity within patient's tolerance;Monitored during session;Repositioned  Home Living                                          Prior Functioning/Environment              Frequency  Min 2X/week        Progress Toward Goals  OT Goals(current goals can now be found in the care plan section)  Progress towards OT goals: Progressing toward goals     Plan Discharge plan remains appropriate;Frequency remains appropriate    Co-evaluation                 AM-PAC PT "6 Clicks" Daily Activity     Outcome Measure   Help from another person eating meals?: None Help from another person taking care of personal grooming?: A Little Help from another person toileting, which includes using toliet, bedpan, or urinal?: A Little Help from another person bathing (including washing, rinsing, drying)?: A Lot Help from another person to put on and taking off regular upper body clothing?: None Help from another person to put on and taking off regular lower body clothing?: A Lot 6 Click Score: 18    End of Session Equipment Utilized During Treatment: Gait belt;Rolling walker  OT Visit Diagnosis: Muscle weakness (generalized) (M62.81);Pain Pain - Right/Left: Right Pain - part of body: Leg   Activity Tolerance Patient tolerated treatment well   Patient Left in bed;with call bell/phone within reach;with family/visitor present   Nurse Communication          Time: [id number] OT Time Calculation (min): 35 min  Charges: OT General Charges $OT Visit: 1 Visit OT Treatments $Self Care/Home Management : 8-22 mins $Therapeutic Activity: 8-22 mins  Richrd PrimeJamie Stiller, MPH, MS, OTR/L ascom [phone] 11/24/17, 4:28 PM

## 2017-11-24 NOTE — Progress Notes (Addendum)
Patient ID: Joshua [name], male   DOB: Jan 21, 1991, 27 y.o.   MRN: [id number] [employer] PROGRESS NOTE  HANAN MOEN WUJ:[id number] DOB: [birth year] DOA: 10/30/2017 PCP: Patient, No Pcp Per  HPI/Subjective: Patient still having some pain at the surgical site.  Patient is interested in doing the right things for himself with respect to coming off pain medications and not using substances in the future.  Objective: Vitals:   11/24/17 0456 11/24/17 1435  BP: 139/79 (!) 153/97  Pulse: 69 71  Resp: 18 16  Temp: 98.5 F (36.9 C) 98.3 F (36.8 C)  SpO2: 97% 100%    Filed Weights   11/20/17 0949 11/20/17 1358  Weight: 74.9 kg (165 lb 2 oz) 72.7 kg (160 lb 4.4 oz)    ROS: Review of Systems  Constitutional: Negative for chills and fever.  Eyes: Negative for blurred vision.  Respiratory: Negative for cough and shortness of breath.   Cardiovascular: Negative for chest pain.  Gastrointestinal: Negative for abdominal pain, constipation, diarrhea, nausea and vomiting.  Genitourinary: Negative for dysuria.  Musculoskeletal: Positive for joint pain.  Neurological: Negative for dizziness and headaches.   Exam: Physical Exam  HENT:  Nose: No mucosal edema.  Mouth/Throat: No oropharyngeal exudate or posterior oropharyngeal edema.  Eyes: Pupils are equal, round, and reactive to light. Conjunctivae, EOM and lids are normal.  Neck: No JVD present. Carotid bruit is not present. No edema present. No thyroid mass and no thyromegaly present.  Cardiovascular: S1 normal and S2 normal. Exam reveals no gallop.  No murmur heard. Pulses:      Dorsalis pedis pulses are 2+ on the left side.  Respiratory: No respiratory distress. He has no wheezes. He has no rhonchi. He has no rales.  GI: Soft. Bowel sounds are normal. There is no tenderness.  Musculoskeletal:       Left ankle: He exhibits swelling.  Lymphadenopathy:    He has no cervical adenopathy.  Neurological: He is alert. No cranial nerve deficit.   Skin: Skin is warm. No rash noted. Nails show no clubbing.  AKA amputation site looks clean and dry.  Staples still in.  Psychiatric: He has a normal mood and affect.      Data Reviewed: Basic Metabolic Panel: Recent Labs  Lab 11/20/17 0152 11/23/17 1155 11/24/17 0444  NA 136 144 143  K 4.3 2.9* 2.6*  CL 98* 101 100*  CO2 26 32 33*  GLUCOSE 112* 102* 89  BUN 29* 25* 13  CREATININE 6.55* 5.38* 2.95*  CALCIUM 7.9* 8.1* 7.9*  PHOS 4.2 5.1* 3.8   CBC: Recent Labs  Lab 11/20/17 0152 11/21/17 0431 11/23/17 0425 11/24/17 0444  WBC 15.5* 10.9*  --   --   HGB 6.2* 7.4* 7.0* 8.2*  HCT 19.2* 22.5*  --   --   MCV 88.2 85.2  --   --   PLT 498* 440  --   --    Cardiac Enzymes: Recent Labs  Lab 11/20/17 1000  CKTOTAL 236     Scheduled Meds: . amLODipine  5 mg Oral QHS  . amoxicillin-clavulanate  1 tablet Oral QHS  . darbepoetin (ARANESP) injection - DIALYSIS  100 mcg Intravenous Q Wed-HD  . feeding supplement (ENSURE ENLIVE)  237 mL Oral TID BM  . ferrous sulfate  325 mg Oral BID WC  . gabapentin  100 mg Oral TID  . multivitamin  1 tablet Oral QHS  . multivitamin with minerals  1 tablet Oral Once per day  on Mon Wed Fri  . nicotine  21 mg Transdermal Daily  . senna  2 tablet Oral QHS  . sodium chloride flush  10-40 mL Intracatheter Q12H  . vitamin C  250 mg Oral BID   Continuous Infusions: . sodium chloride      Assessment/Plan:  1. Pneumonia potentially aspiration.  Zosyn switched over to Augmentin at night.  Acute hypoxic respiratory failure has improved.  Patient off oxygen today. 2. End-stage renal disease on hemodialysis as per nephrology.  Patient has a PermCath right chest.  Still awaiting outpatient dialysis slot.  We will not hear back until Thursday.  Nephrology going to hold off on dialysis and see what happens with his kidney function since he is now making urine. 3. Acute cardiac arrest secondary to polysubstance abuse 4. Acute rhabdomyolysis  secondary to cardiac arrest and right leg compartment syndrome 5. Hyperkalemia secondary to acute kidney injury.  Managed with hemodialysis. 6. Acute right lower extremity ischemia.  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 7. Chronic pain syndrome on oxycodone and Neurontin 8. Acute on chronic anemia.  Hemoglobin responded to blood.  On Aranesp and iron.   9. Malnutrition on Ensure 10. Essential hypertension start low-dose Norvasc at night  Code Status:     Code Status Orders  (From admission, onward)        Start     Ordered   11/12/17 1637  Full code  Continuous     11/12/17 1636    Code Status History    Date Active Date Inactive Code Status Order ID Comments User Context   11/04/2017 1544 11/12/2017 1636 Partial Code [id number]  Morton StallGriffin, Crystal, NP Inpatient   10/30/2017 2110 11/04/2017 1544 Full Code [id number]  Lewie Loronukov, Magadalene S, NP Inpatient   10/30/2017 1813 10/30/2017 2110 Full Code [id number]  Altamese DillingVachhani, Vaibhavkumar, MD Inpatient      Disposition Plan: Hopefully this week can set up an outpatient dialysis slot and home with the patient's sister and [place]  Consultants:  Nephrology  Vascular surgery  Antibiotics:   Augmentin  Time spent: 35 minutes including ACP time in speaking with nephrology  Alford Highlandichard Merrill Deanda  [employer]

## 2017-11-24 NOTE — Progress Notes (Signed)
Dr. Katheren ShamsSalary notified of critical K+ of 2.6; acknowledged; new order written. Windy Carinaurner,Camillia Marcy K, RN 6:17 AM 11/24/2017

## 2017-11-24 NOTE — Progress Notes (Addendum)
Physical Therapy Treatment Patient Details Name: Joshua [name] MRN: [id number] DOB: August 30, 1990 Today's Date: 11/24/2017    History of Present Illness 26yo male pt presented to ER after being found unresponsive, down for unknown period of time, related to ingestion of controlled substances.  Patient suffered vfib arrest en route and underwent 45 minutes CPR; subsequently transitioned to hypothermia cooling protocol.  Remained intubated and sedated until self-extubation 3/15. UDS positive for cocaine and opiates.  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    PT Comments    Per chart review, patient noted with recent potassium of 2.6; received oral supplementation this AM.  Initiated curb negotiation with RW this date to ensure ability to safety enter/exit home environment at discharge.  Patient able to complete, but requires min assist +2 for optimal safety.  Patient unsure of exact environmental set up at sister's home (how many steps, rails); to discuss with sister and let therapist know for full simulation. Re-introduced R residual limb wrapping, role and importance of wrapping.  Patient did allow therapist to apply wrap this date, but demonstrated limited active participation in learning technique himself.  Patient/caregiver will need continued education to ensure compliance upon discharge. Continue to feel strongly that patient would benefit optimally from CIR at discharge for high-intensity post-acute rehab services.  While functional status is improving with skilled PT/OT services, patient still with significant educational/training needs related to residual limb care, wrapping and overall functional mobility to maximize return to optimal level of functional indep appropriate for 27 year old  patient.  Patient to benefit/require manual WC for longer community distances until prosthesis received/functional.   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 A walker will not resolve the patient's issue with performing activities of daily living. A wheelchair is required/recommended and will allow patient to safely perform daily activities.  Patient is capable of safely self-propelling and managing all parts.   WC Recs: 18w x 16d, standard height, standard leg rests with pull-to-lock brakes, anti-tippers and pressure-relieving cushion; may consider amputee axle for optimal safety.      Follow Up Recommendations  CIR     Equipment Recommendations  Rolling walker with 5" wheels    Recommendations for Other Services       Precautions / Restrictions Precautions Precautions: Fall Precaution Comments: pressure area to L heel, R IJ perm-cath Restrictions Weight Bearing Restrictions: Yes RLE Weight Bearing: Non weight bearing Other Position/Activity Restrictions: R AKA    Mobility  Bed Mobility Overal bed mobility: Modified Independent Bed Mobility: Supine to Sit;Sit to Supine              Transfers Overall transfer level: Needs assistance Equipment used: Rolling walker (2 wheeled) Transfers: Sit to/from Stand Sit to Stand: Min guard         General transfer comment: improved hand placement  Ambulation/Gait Ambulation/Gait assistance: Min guard Ambulation Distance (Feet): 10 Feet(x2) Assistive device: Rolling walker (2 wheeled)       General Gait Details: continues to improve with control and stability   Stairs Stairs: Yes   Stair Management: With walker Number of Stairs: 1(x2) General stair comments: curb management with RW; prefers (and demonstrates improved performance) when ascending forward vs backwards  Wheelchair Mobility    Modified Rankin  (  Stroke Patients Only)       Balance Overall balance assessment: Needs assistance Sitting-balance support: No upper extremity supported;Feet supported Sitting balance-Leahy Scale: Good       Standing balance-Leahy Scale: Fair Standing balance comment: requires at least unilateral UE support at all times                            Cognition Arousal/Alertness: Awake/alert Behavior During Therapy: WFL for tasks assessed/performed Overall Cognitive Status: Within Functional Limits for tasks assessed                                 General Comments: Pt A&Ox4, increased time for processing of new info and multi step commands, poor insight/safety awareness but with VC is able to improve attention      Exercises Other Exercises Other Exercises: Re-introduced R stump wrapping for compression, shaping.  Reviewed role and importance of wrapping.  Patient voiced understanding, but demonstrates limited interest/active participation in learning formal wrapping technique.  Dep assist from therapist for wrapping.  Do note decreased edema throughout residual limb; small amount of 'squaring off' at medial incision line.    General Comments        Pertinent Vitals/Pain Pain Assessment: Faces Faces Pain Scale: Hurts little more Pain Location: R residual limb Pain Descriptors / Indicators: Aching;Grimacing Pain Intervention(s): Limited activity within patient's tolerance;Monitored during session;Repositioned    Home Living                      Prior Function            PT Goals (current goals can now be found in the care plan section) Acute Rehab PT Goals Patient Stated Goal: To be able to do things for himself again PT Goal Formulation: With patient Time For Goal Achievement: 11/28/17 Potential to Achieve Goals: Good Progress towards PT goals: Progressing toward goals    Frequency    7X/week      PT Plan Current plan remains appropriate     Co-evaluation              AM-PAC PT "6 Clicks" Daily Activity  Outcome Measure  Difficulty turning over in bed (including adjusting bedclothes, sheets and blankets)?: None Difficulty moving from lying on back to sitting on the side of the bed? : None Difficulty sitting down on and standing up from a chair with arms (e.g., wheelchair, bedside commode, etc,.)?: Unable Help needed moving to and from a bed to chair (including a wheelchair)?: A Little Help needed walking in hospital room?: A Little Help needed climbing 3-5 steps with a railing? : A Lot 6 Click Score: 17    End of Session Equipment Utilized During Treatment: Gait belt Activity Tolerance: Patient tolerated treatment well Patient left: in bed;with bed alarm set;with call bell/phone within reach Nurse Communication: Mobility status PT Visit Diagnosis: Difficulty in walking, not elsewhere classified (R26.2);Muscle weakness (generalized) (M62.81)     Time: [id number] PT Time Calculation (min) (ACUTE ONLY): 35 min  Charges:  $Gait Training: 8-22 mins $Therapeutic Activity: 8-22 mins                    G Codes:      Adell Koval H. Manson PasseyBrown, PT, DPT, NCS 11/24/17, 9:02 PM [phone]

## 2017-11-24 NOTE — Progress Notes (Signed)
Hospitalist repaged; awaiting callback. Windy Carinaurner,Tyrisha Benninger K, RN 6:15 AM 11/24/2017

## 2017-11-24 NOTE — Progress Notes (Signed)
Right groin pain that's increasing throughout the day, sharp, constant, especially with touch; patient states that "..it feels like something is in there.Joshua [name].Joshua [name].I want the doctor to check it out in the morning..." Windy Carinaurner,Annalee Meyerhoff K, RN 1:05 AM 11/24/2017

## 2017-11-24 NOTE — Progress Notes (Signed)
Nutrition Follow-up  DOCUMENTATION CODES:   Not applicable  INTERVENTION:   Continue Ensure Enlive po TID, each supplement provides 350 kcal and 20 grams of protein.  Continue daily MVI 3x per week  Continue Rena-vite daily  Continue snacks   NUTRITION DIAGNOSIS:   Inadequate oral intake related to decreased appetite as evidenced by per patient/family report.  GOAL:   Patient will meet greater than or equal to 90% of their needs  Progressing.  MONITOR:   PO intake, Supplement acceptance, Labs, Weight trends, Skin, I & O's  ASSESSMENT:   27 year old male with PMHx of bronchitis, asthma, polysubstance abuse who is admitted after being found unresponsive in hotel room s/p cardiac arrest with unknown down time and found to be in ventricular fibrillation, also with renal failure, rhabdomyolysis, leukocytosis, shock, and ischemic right lower extremity after being in fetal position. Patient was intubated 3/8 and started on 36C TTM.   Pt s/p R AKA revision 3/19 Pt s/p perm cath placement 3/25.   Pt with new PNA possibly from aspiration. Pt continues to have fair appetite and oral intake. Per chart, pt eating anywhere from bites to 100% of meals. Pt eating some food brought from outside the hospital. Pt is drinking some Ensure and eating some snacks sent to the room. No new weight since 3/29; recommend obtain new weight.  Last HD 4/1. Pt getting rena-vite daily. Continue supplements. Pt with low potassium today; this is being supplemented.   Medications reviewed and include: Augmentin, darbepoetin, ferrous sulfate, rena-vite, MVI, nicotine, senokot, vitamin C, oxycodone   Labs reviewed: K 2.6(L), Cl 100(L), creat 2.95(H), Ca 7.9(L) adj. 9.18 wnl, alb 2.4(L), P 3.8 wnl Hgb 8.2(L)  Diet Order:  Fall precautions Skin care precautions Diet renal with fluid restriction Fluid restriction: 1200 mL Fluid; Room service appropriate? Yes; Fluid consistency: Thin  EDUCATION NEEDS:   No  education needs have been identified at this time  Skin:  Skin Assessment: Skin Integrity Issues:(MSAD to groin; closed incision right leg)  Last BM:  4/2- type 6   Height:   Ht Readings from Last 1 Encounters:  11/16/17 5\' 10"  (1.778 m)    Weight:   Wt Readings from Last 1 Encounters:  10/19/17 165 lb (74.8 kg)    Ideal Body Weight:  75.5 kg  BMI:  Body mass index is 23 kg/m.  Estimated Nutritional Needs:   Kcal:  2100-2400kcal/day   Protein:  95-110g/day   Fluid:  >2.3L/day or per MD  Betsey Holidayasey Dennison Mcdaid MS, RD, LDN Pager #[phone] After Hours Pager: [phone]

## 2017-11-24 NOTE — Progress Notes (Signed)
Critical K+ 2.6; Hospitalist on-call paged; awaiting callback. Windy Carinaurner,Sharunda Salmon K, RN 6:04 AM 11/24/2017

## 2017-11-24 NOTE — Progress Notes (Signed)
[facility]  ROUNDING NOTE   Subjective:   Hemodialysis yesterday. UF of 2517mL. UOP 1155mL  Objective:  Vital signs in last 24 hours:  Temp:  [98.5 F (36.9 C)-99.7 F (37.6 C)] 98.5 F (36.9 C) (04/02 0456) Pulse Rate:  [64-100] 69 (04/02 0456) Resp:  [14-25] 18 (04/02 0456) BP: (129-164)/(73-119) 139/79 (04/02 0456) SpO2:  [95 %-100 %] 97 % (04/02 0456)  Weight change:  Filed Weights   11/20/17 0949 11/20/17 1358  Weight: 74.9 kg (165 lb 2 oz) 72.7 kg (160 lb 4.4 oz)    Intake/Output: I/O last 3 completed shifts: In: 1290 [P.O.:360; Blood:830; IV Piggyback:100] Out: 1610 [RUEAV:[id number] [Urine:2185; Other:2517]   Intake/Output this shift:  Total I/O In: -  Out: 580 [Urine:580]  Physical Exam: General: NAD, laying in the bed  Head: [place]/AT  Eyes: Anicteric  Neck: supple  Lungs:  clear to auscultation, normal effort  Heart: Regular, no rubs  Abdomen:  Soft, nontender  Extremities: Rt AKA  Neurologic: Alert, able to follow commands  Access: RIJ Permcath 11/16/17       Basic Metabolic Panel: Recent Labs  Lab 11/20/17 0152 11/23/17 1155 11/24/17 0444  NA 136 144 143  K 4.3 2.9* 2.6*  CL 98* 101 100*  CO2 26 32 33*  GLUCOSE 112* 102* 89  BUN 29* 25* 13  CREATININE 6.55* 5.38* 2.95*  CALCIUM 7.9* 8.1* 7.9*  PHOS 4.2 5.1* 3.8    Liver Function Tests: Recent Labs  Lab 11/23/17 1155 11/24/17 0444  ALBUMIN 2.3* 2.4*   No results for input(s): LIPASE, AMYLASE in the last 168 hours. No results for input(s): AMMONIA in the last 168 hours.  CBC: Recent Labs  Lab 11/20/17 0152 11/21/17 0431 11/23/17 0425 11/24/17 0444  WBC 15.5* 10.9*  --   --   HGB 6.2* 7.4* 7.0* 8.2*  HCT 19.2* 22.5*  --   --   MCV 88.2 85.2  --   --   PLT 498* 440  --   --     Cardiac Enzymes: Recent Labs  Lab 11/20/17 1000  CKTOTAL 236    BNP: Invalid input(s): POCBNP  CBG: No results for input(s): GLUCAP in the last 168 hours.  Microbiology: Results for orders  placed or performed during the hospital encounter of 10/30/17  MRSA PCR Screening     Status: None   Collection Time: 10/30/17  7:54 PM  Result Value Ref Range Status   MRSA by PCR NEGATIVE NEGATIVE Final    Comment:        The GeneXpert MRSA Assay (FDA approved for NASAL specimens only), is one component of a comprehensive MRSA colonization surveillance program. It is not intended to diagnose MRSA infection nor to guide or monitor treatment for MRSA infections. Performed at [facility], [address] [place], [place] [address]   Culture, respiratory (NON-Expectorated)     Status: None   Collection Time: 11/05/17  8:17 AM  Result Value Ref Range Status   Specimen Description   Final    TRACHEAL ASPIRATE Performed at [facility], [address] [place], [place] [address]    Special Requests   Final    NONE Performed at [facility], [address] [place], [place] [address]    Gram Stain   Final    FEW WBC PRESENT, PREDOMINANTLY PMN MODERATE GRAM NEGATIVE RODS RARE YEAST Performed at [facility], [address] [place], [place] [address]    Culture MODERATE ENTEROBACTER  CLOACAE  Final   Report Status 11/08/2017 FINAL  Final   Organism ID, Bacteria ENTEROBACTER CLOACAE  Final      Susceptibility   Enterobacter cloacae - MIC*    CEFAZOLIN >=64 RESISTANT Resistant     CEFEPIME <=1 SENSITIVE Sensitive     CEFTAZIDIME <=1 SENSITIVE Sensitive     CEFTRIAXONE <=1 SENSITIVE Sensitive     CIPROFLOXACIN <=0.25 SENSITIVE Sensitive     GENTAMICIN <=1 SENSITIVE Sensitive     IMIPENEM <=0.25 SENSITIVE Sensitive     TRIMETH/SULFA <=20 SENSITIVE Sensitive     PIP/TAZO <=4 SENSITIVE Sensitive     * MODERATE ENTEROBACTER CLOACAE  Culture, blood (routine x 2)     Status: None   Collection Time: 11/09/17  8:49 PM  Result Value Ref Range Status   Specimen Description BLOOD DIALYSIS  Final   Special Requests   Final    BOTTLES DRAWN AEROBIC  AND ANAEROBIC Blood Culture adequate volume   Culture   Final    NO GROWTH 5 DAYS Performed at [facility], [address], [place], [place] [address]    Report Status 11/14/2017 FINAL  Final    Coagulation Studies: No results for input(s): LABPROT, INR in the last 72 hours.  Urinalysis: No results for input(s): COLORURINE, LABSPEC, PHURINE, GLUCOSEU, HGBUR, BILIRUBINUR, KETONESUR, PROTEINUR, UROBILINOGEN, NITRITE, LEUKOCYTESUR in the last 72 hours.  Invalid input(s): APPERANCEUR    Imaging: No results found.   Medications:   . sodium chloride    . piperacillin-tazobactam (ZOSYN)  IV Stopped (11/24/17 0805)   . amLODipine  5 mg Oral QHS  . darbepoetin (ARANESP) injection - DIALYSIS  100 mcg Intravenous Q Wed-HD  . feeding supplement (ENSURE ENLIVE)  237 mL Oral TID BM  . ferrous sulfate  325 mg Oral BID WC  . gabapentin  100 mg Oral TID  . multivitamin  1 tablet Oral QHS  . multivitamin with minerals  1 tablet Oral Once per day on Mon Wed Fri  . nicotine  21 mg Transdermal Daily  . senna  2 tablet Oral QHS  . sodium chloride flush  10-40 mL Intracatheter Q12H  . vitamin C  250 mg Oral BID   acetaminophen, ALPRAZolam, bisacodyl, dextrose, hydrALAZINE, ipratropium **OR** levalbuterol, naLOXone (NARCAN)  injection, nystatin cream, oxyCODONE, oxyCODONE, sennosides  Assessment/ Plan:  [name] is a 27 y.o. white male with asthma, bronchitis, polysubstance abuse, was admitted on3/8/2019after he was found down in a hotel for an unknown period of time in the fetal position. Hospital course includes prolonged CPR for 45 minutes in the emergency room. Ventricular fibrillation arrest, hypothermia protocol.  Urine tox screen positive for cocaine and opiates.  Hospital course complicated by acute respiratory failure and acute renal failure. Started on CRRT 3/8. Underwent disarticulation of right lower extremity due to ischemic limb. Rhabdomyolysis with sustained CPK  greater than 50,000 for several days.Self extubated on 3/15. Transitioned to intermittent hemodialysis on 3/16.   1.  End stage renal disease:  Cause Acute renal failure secondary to ATN, shock, hypotension and rhabdomyolysis, no recovery. -  Will hold dialysis for tomorrow and monitor urine output, renal function, volume status and serum electrolytes.  - Outpatient planning for [facility].   2. Rhabdomyolysis: cocaine induced rhabdomyolysis.     3.  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 Gilda Crease on 3/12.   Underwent revision AKA 3/19  4.  Anemia of CKD: hemoglobin 8.2. PRBC transfusion  yesterday 4/1 -  Continue Aranesp.  5. Hypertension:   - amlodipine      LOS: 25 Aireonna Bauer 4/2/20191:17 PM

## 2017-11-25 LAB — CBC
HCT: 25.9 % — ABNORMAL LOW (ref 40.0–52.0)
Hemoglobin: 8.5 g/dL — ABNORMAL LOW (ref 13.0–18.0)
MCH: 27.9 pg (ref 26.0–34.0)
MCHC: 32.7 g/dL (ref 32.0–36.0)
MCV: 85.4 fL (ref 80.0–100.0)
PLATELETS: 547 10*3/uL — AB (ref 150–440)
RBC: 3.03 MIL/uL — AB (ref 4.40–5.90)
RDW: 14.7 % — AB (ref 11.5–14.5)
WBC: 9.8 10*3/uL (ref 3.8–10.6)

## 2017-11-25 LAB — RENAL FUNCTION PANEL
Albumin: 2.5 g/dL — ABNORMAL LOW (ref 3.5–5.0)
Anion gap: 10 (ref 5–15)
BUN: 18 mg/dL (ref 6–20)
CALCIUM: 8.2 mg/dL — AB (ref 8.9–10.3)
CO2: 29 mmol/L (ref 22–32)
CREATININE: 3.31 mg/dL — AB (ref 0.61–1.24)
Chloride: 105 mmol/L (ref 101–111)
GFR calc non Af Amer: 24 mL/min — ABNORMAL LOW (ref >60)
GFR, EST AFRICAN AMERICAN: 28 mL/min — AB (ref >60)
Glucose, Bld: 89 mg/dL (ref 65–99)
Phosphorus: 4.9 mg/dL — ABNORMAL HIGH (ref 2.5–4.6)
Potassium: 3 mmol/L — ABNORMAL LOW (ref 3.5–5.1)
SODIUM: 144 mmol/L (ref 135–145)

## 2017-11-25 MED ORDER — ALPRAZOLAM 0.25 MG PO TABS
0.2500 mg | ORAL_TABLET | Freq: Two times a day (BID) | ORAL | Status: DC | PRN
  Administered 2017-11-26 – 2017-11-27 (×2): 0.25 mg via ORAL
  Filled 2017-11-25 (×3): qty 1

## 2017-11-25 MED ORDER — AMOXICILLIN-POT CLAVULANATE 500-125 MG PO TABS
1.0000 | ORAL_TABLET | Freq: Two times a day (BID) | ORAL | Status: AC
  Administered 2017-11-25 – 2017-11-26 (×4): 500 mg via ORAL
  Filled 2017-11-25 (×4): qty 1

## 2017-11-25 MED ORDER — MORPHINE SULFATE (PF) 2 MG/ML IV SOLN: 2.0000 mg | Freq: Once | INTRAVENOUS | Status: AC

## 2017-11-25 NOTE — Care Management (Signed)
HD currently being held.  Awaiting notification if patient will require outpatient HD, and if so confirmation of outpatient HD placement.   Until outpatient HD has been determine and arranged CIR can not plan towards admission.   Should patient return to his sisters home at discharge he will require home health services, BSC, and WC through [facility].  Barbara CowerJason with [facility] aware.  Patient has new patient PCP appointment scheduled for 12/03/17

## 2017-11-25 NOTE — Progress Notes (Signed)
Patient is making urine now and crcl is up to 34.2 ml/min. Nephrology is holding HD today. I will increase Augmentin dose to 500 BID x 4 more doses Follow for HD plan  Olene FlossMelissa D Kenniel Bergsma, Pharm.D, BCPS Clinical Pharmacist

## 2017-11-25 NOTE — Progress Notes (Signed)
Patient ID: Joshua [name], male   DOB: 11/15/1990, 27 y.o.   MRN: [id number]  [employer] PROGRESS NOTE  Joshua [name] WUJ:[id number] DOB: 12/02/1990 DOA: 10/30/2017 PCP: Patient, No Pcp Per  HPI/Subjective: Patient seen sitting in the chair.  Feeling okay.  Still having pain in the stump site.  Asking me not to taper pain medications today.  Objective: Vitals:   11/25/17 1133 11/25/17 1217  BP:  (!) 137/94  Pulse: 77 76  Resp:  18  Temp:  98.3 F (36.8 C)  SpO2: 95% 97%    Filed Weights   11/20/17 0949 11/20/17 1358  Weight: 74.9 kg (165 lb 2 oz) 72.7 kg (160 lb 4.4 oz)    ROS: Review of Systems  Constitutional: Negative for chills and fever.  Eyes: Negative for blurred vision.  Respiratory: Negative for cough and shortness of breath.   Cardiovascular: Negative for chest pain.  Gastrointestinal: Negative for abdominal pain, constipation, diarrhea, nausea and vomiting.  Genitourinary: Negative for dysuria.  Musculoskeletal: Positive for joint pain.  Neurological: Negative for dizziness and headaches.   Exam: Physical Exam  HENT:  Nose: No mucosal edema.  Mouth/Throat: No oropharyngeal exudate or posterior oropharyngeal edema.  Eyes: Pupils are equal, round, and reactive to light. Conjunctivae, EOM and lids are normal.  Neck: No JVD present. Carotid bruit is not present. No edema present. No thyroid mass and no thyromegaly present.  Cardiovascular: S1 normal and S2 normal. Exam reveals no gallop.  No murmur heard. Pulses:      Dorsalis pedis pulses are 2+ on the left side.  Respiratory: No respiratory distress. He has no wheezes. He has no rhonchi. He has no rales.  GI: Soft. Bowel sounds are normal. There is no tenderness.  Musculoskeletal:       Left ankle: He exhibits swelling.  Lymphadenopathy:    He has no cervical adenopathy.  Neurological: He is alert. No cranial nerve deficit.  Skin: Skin is warm. No rash noted. Nails show no clubbing.  AKA amputation site  looks clean and dry.  Staples still in.  Psychiatric: He has a normal mood and affect.      Data Reviewed: Basic Metabolic Panel: Recent Labs  Lab 11/20/17 0152 11/23/17 1155 11/24/17 0444 11/25/17 0405  NA 136 144 143 144  K 4.3 2.9* 2.6* 3.0*  CL 98* 101 100* 105  CO2 26 32 33* 29  GLUCOSE 112* 102* 89 89  BUN 29* 25* 13 18  CREATININE 6.55* 5.38* 2.95* 3.31*  CALCIUM 7.9* 8.1* 7.9* 8.2*  PHOS 4.2 5.1* 3.8 4.9*   CBC: Recent Labs  Lab 11/20/17 0152 11/21/17 0431 11/23/17 0425 11/24/17 0444 11/25/17 0405  WBC 15.5* 10.9*  --   --  9.8  HGB 6.2* 7.4* 7.0* 8.2* 8.5*  HCT 19.2* 22.5*  --   --  25.9*  MCV 88.2 85.2  --   --  85.4  PLT 498* 440  --   --  547*   Cardiac Enzymes: Recent Labs  Lab 11/20/17 1000  CKTOTAL 236     Scheduled Meds: . amLODipine  5 mg Oral QHS  . amoxicillin-clavulanate  1 tablet Oral BID  . darbepoetin (ARANESP) injection - DIALYSIS  100 mcg Intravenous Q Wed-HD  . feeding supplement (ENSURE ENLIVE)  237 mL Oral TID BM  . ferrous sulfate  325 mg Oral BID WC  . gabapentin  100 mg Oral TID  . multivitamin  1 tablet Oral QHS  . multivitamin with  minerals  1 tablet Oral Once per day on Mon Wed Fri  . nicotine  21 mg Transdermal Daily  . senna  2 tablet Oral QHS  . sodium chloride flush  10-40 mL Intracatheter Q12H  . vitamin C  250 mg Oral BID   Continuous Infusions: . sodium chloride      Assessment/Plan:  1. Pneumonia potentially aspiration.  Augmentin.  Patient's acute respiratory failure has resolved and patient breathing comfortably on room air.  2. End-stage renal disease on hemodialysis as per nephrology.  Patient has a PermCath right chest.  Still awaiting outpatient dialysis slot.  We will not hear back until Thursday.   check BMP tomorrow.  Creatinine did worsen with being off dialysis 1 day.    3. Acute cardiac arrest secondary to polysubstance abuse 4. Acute rhabdomyolysis secondary to cardiac arrest and right leg  compartment syndrome 5. Hyperkalemia secondary to acute kidney injury.  Managed with hemodialysis. 6. Acute right lower extremity ischemia.  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 7. Chronic pain syndrome.  Taper oxycodone.  No IV pain medications.  I told the patient we need to taper his oxycodone to off as outpatient.  Continue Neurontin low dose.  Taper off Xanax also. 8. Acute on chronic anemia.  Hemoglobin responded to blood.  On Aranesp and iron.   9. Malnutrition on Ensure 10. Essential hypertension start low-dose Norvasc at night  Code Status:     Code Status Orders  (From admission, onward)        Start     Ordered   11/12/17 1637  Full code  Continuous     11/12/17 1636    Code Status History    Date Active Date Inactive Code Status Order ID Comments User Context   11/04/2017 1544 11/12/2017 1636 Partial Code [id number]  Morton Stall, NP Inpatient   10/30/2017 2110 11/04/2017 1544 Full Code [id number]  Lewie Loron, NP Inpatient   10/30/2017 1813 10/30/2017 2110 Full Code [id number]  Altamese Dilling, MD Inpatient      Disposition Plan: Hopefully this week can set up an outpatient dialysis slot and home with the patient's sister and [place]  Consultants:  Nephrology  Vascular surgery  Antibiotics:   Augmentin  Time spent: 25 minutes  Lamonta [place]

## 2017-11-25 NOTE — Progress Notes (Signed)
Physical Therapy Treatment Patient Details Name: Joshua [name] MRN: [id number] DOB: 15-May-1991 Today's Date: 11/25/2017    History of Present Illness 26yo male pt presented to ER after being found unresponsive, down for unknown period of time, related to ingestion of controlled substances.  Patient suffered vfib arrest en route and underwent 45 minutes CPR; subsequently transitioned to hypothermia cooling protocol.  Remained intubated and sedated until self-extubation 3/15. UDS positive for cocaine and opiates.  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    PT Comments    Pt agreeable to PT; reports 10/10 pain R residual limb and expresses frustration with pain medication dosing times. Pt wishes to discuss with MD (MD in at session's end). Pt demonstrates increased time for all mobility: Mod I with bed mobility, Min guard for STS requiring cues/education for proper hand placement and Min guard/Min A for ambulation. Pt progressing ambulation distance this session, but also 1 minor LOB while turning around requiring Min A to correct. Pt received up in chair comfortably. Continue PT to progress strength, endurance and safety with all mobility to improved functional mobility. To be noted: pt does not have information at this time of exact layout of potential home situation.   Follow Up Recommendations  CIR     Equipment Recommendations  Rolling walker with 5" wheels    Recommendations for Other Services       Precautions / Restrictions Precautions Precautions: Fall Restrictions Weight Bearing Restrictions: Yes RLE Weight Bearing: Non weight bearing    Mobility  Bed Mobility Overal bed mobility: Modified Independent Bed Mobility: Supine to Sit     Supine to sit: Modified independent  (Device/Increase time)     General bed mobility comments: Mild increased time and use of rails  Transfers Overall transfer level: Needs assistance Equipment used: Rolling walker (2 wheeled) Transfers: Sit to/from Stand Sit to Stand: Min guard         General transfer comment: continues with poor use of hands (B on rw to stand). Good use of 1 to reach back for sit  Ambulation/Gait Ambulation/Gait assistance: Min guard;Min assist Ambulation Distance (Feet): 40 Feet Assistive device: Rolling walker (2 wheeled) Gait Pattern/deviations: (Hop to )   Gait velocity interpretation: <1.8 ft/sec, indicative of risk for recurrent falls General Gait Details: Slow pace. Demonstrates good stability except for 1 mild LOB upon turning around with Min A to correct.    Stairs            Wheelchair Mobility    Modified Rankin (Stroke Patients Only)       Balance Overall balance assessment: Needs assistance Sitting-balance support: No upper extremity supported;Feet supported Sitting balance-Leahy Scale: Normal     Standing balance support: Bilateral upper extremity supported Standing balance-Leahy Scale: Fair                              Cognition Arousal/Alertness: Awake/alert Behavior During Therapy: WFL for tasks assessed/performed;Agitated Overall Cognitive Status: Within Functional Limits for tasks assessed                                        Exercises      General Comments  Pertinent Vitals/Pain Pain Assessment: 0-10 Pain Score: 10-Worst pain ever Pain Location: R residual limb Pain Descriptors / Indicators: Constant;Operative site guarding;Moaning Pain Intervention(s): Limited activity within patient's tolerance;Monitored during session;Other (comment)(Pt wish to discuss pain medication intervals with MD)    Home Living                      Prior Function            PT Goals (current goals can now be found  in the care plan section) Progress towards PT goals: Progressing toward goals    Frequency    7X/week      PT Plan Current plan remains appropriate    Co-evaluation              AM-PAC PT "6 Clicks" Daily Activity  Outcome Measure  Difficulty turning over in bed (including adjusting bedclothes, sheets and blankets)?: None Difficulty moving from lying on back to sitting on the side of the bed? : None Difficulty sitting down on and standing up from a chair with arms (e.g., wheelchair, bedside commode, etc,.)?: Unable Help needed moving to and from a bed to chair (including a wheelchair)?: A Little Help needed walking in hospital room?: A Little Help needed climbing 3-5 steps with a railing? : A Lot 6 Click Score: 17    End of Session Equipment Utilized During Treatment: Gait belt Activity Tolerance: Patient tolerated treatment well Patient left: in chair;with call bell/phone within reach;with chair alarm set;with nursing/sitter in room   PT Visit Diagnosis: Difficulty in walking, not elsewhere classified (R26.2);Muscle weakness (generalized) (M62.81)     Time: [id number] PT Time Calculation (min) (ACUTE ONLY): 26 min  Charges:  $Therapeutic Exercise: 8-22 mins $Therapeutic Activity: 8-22 mins                    G Codes:        Scot DockHeidi E Rupinder Livingston, PTA 11/25/2017, 12:14 PM

## 2017-11-25 NOTE — Progress Notes (Signed)
[facility]  ROUNDING NOTE   Subjective:   Holding hemodialysis today.   UOP 2080  Creatinine 3.31 (2.95)  Objective:  Vital signs in last 24 hours:  Temp:  [98.3 F (36.8 C)-98.8 F (37.1 C)] 98.3 F (36.8 C) (04/03 1217) Pulse Rate:  [62-85] 76 (04/03 1217) Resp:  [18-20] 18 (04/03 1217) BP: (137-154)/(94-104) 137/94 (04/03 1217) SpO2:  [93 %-98 %] 97 % (04/03 1217)  Weight change:  Filed Weights   11/20/17 0949 11/20/17 1358  Weight: 74.9 kg (165 lb 2 oz) 72.7 kg (160 lb 4.4 oz)    Intake/Output: I/O last 3 completed shifts: In: 240 [P.O.:240] Out: 2705 [Urine:2705]   Intake/Output this shift:  Total I/O In: 240 [P.O.:240] Out: 1400 [Urine:1400]  Physical Exam: General: NAD, laying in the bed  Head: [place]/AT  Eyes: Anicteric  Neck: supple  Lungs:  clear to auscultation, normal effort  Heart: Regular, no rubs  Abdomen:  Soft, nontender  Extremities: Rt AKA  Neurologic: Alert, able to follow commands  Access: RIJ Permcath 11/16/17       Basic Metabolic Panel: Recent Labs  Lab 11/20/17 0152 11/23/17 1155 11/24/17 0444 11/25/17 0405  NA 136 144 143 144  K 4.3 2.9* 2.6* 3.0*  CL 98* 101 100* 105  CO2 26 32 33* 29  GLUCOSE 112* 102* 89 89  BUN 29* 25* 13 18  CREATININE 6.55* 5.38* 2.95* 3.31*  CALCIUM 7.9* 8.1* 7.9* 8.2*  PHOS 4.2 5.1* 3.8 4.9*    Liver Function Tests: Recent Labs  Lab 11/23/17 1155 11/24/17 0444 11/25/17 0405  ALBUMIN 2.3* 2.4* 2.5*   No results for input(s): LIPASE, AMYLASE in the last 168 hours. No results for input(s): AMMONIA in the last 168 hours.  CBC: Recent Labs  Lab 11/20/17 0152 11/21/17 0431 11/23/17 0425 11/24/17 0444 11/25/17 0405  WBC 15.5* 10.9*  --   --  9.8  HGB 6.2* 7.4* 7.0* 8.2* 8.5*  HCT 19.2* 22.5*  --   --  25.9*  MCV 88.2 85.2  --   --  85.4  PLT 498* 440  --   --  547*    Cardiac Enzymes: Recent Labs  Lab 11/20/17 1000  CKTOTAL 236    BNP: Invalid input(s):  POCBNP  CBG: No results for input(s): GLUCAP in the last 168 hours.  Microbiology: Results for orders placed or performed during the hospital encounter of 10/30/17  MRSA PCR Screening     Status: None   Collection Time: 10/30/17  7:54 PM  Result Value Ref Range Status   MRSA by PCR NEGATIVE NEGATIVE Final    Comment:        The GeneXpert MRSA Assay (FDA approved for NASAL specimens only), is one component of a comprehensive MRSA colonization surveillance program. It is not intended to diagnose MRSA infection nor to guide or monitor treatment for MRSA infections. Performed at [facility], [address] [place], [place] [address]   Culture, respiratory (NON-Expectorated)     Status: None   Collection Time: 11/05/17  8:17 AM  Result Value Ref Range Status   Specimen Description   Final    TRACHEAL ASPIRATE Performed at [facility], [address], [place], [place] [address]    Special Requests   Final    NONE Performed at [facility], [address] [place], [place] [address]    Gram Stain   Final    FEW WBC PRESENT, PREDOMINANTLY PMN MODERATE GRAM NEGATIVE RODS  RARE YEAST Performed at [facility], [address] [place], [place] [address]    Culture MODERATE ENTEROBACTER CLOACAE  Final   Report Status 11/08/2017 FINAL  Final   Organism ID, Bacteria ENTEROBACTER CLOACAE  Final      Susceptibility   Enterobacter cloacae - MIC*    CEFAZOLIN >=64 RESISTANT Resistant     CEFEPIME <=1 SENSITIVE Sensitive     CEFTAZIDIME <=1 SENSITIVE Sensitive     CEFTRIAXONE <=1 SENSITIVE Sensitive     CIPROFLOXACIN <=0.25 SENSITIVE Sensitive     GENTAMICIN <=1 SENSITIVE Sensitive     IMIPENEM <=0.25 SENSITIVE Sensitive     TRIMETH/SULFA <=20 SENSITIVE Sensitive     PIP/TAZO <=4 SENSITIVE Sensitive     * MODERATE ENTEROBACTER CLOACAE  Culture, blood (routine x 2)     Status: None   Collection Time: 11/09/17  8:49 PM  Result Value Ref Range  Status   Specimen Description BLOOD DIALYSIS  Final   Special Requests   Final    BOTTLES DRAWN AEROBIC AND ANAEROBIC Blood Culture adequate volume   Culture   Final    NO GROWTH 5 DAYS Performed at [facility], [address] [place], [place] [address]    Report Status 11/14/2017 FINAL  Final    Coagulation Studies: No results for input(s): LABPROT, INR in the last 72 hours.  Urinalysis: No results for input(s): COLORURINE, LABSPEC, PHURINE, GLUCOSEU, HGBUR, BILIRUBINUR, KETONESUR, PROTEINUR, UROBILINOGEN, NITRITE, LEUKOCYTESUR in the last 72 hours.  Invalid input(s): APPERANCEUR    Imaging: No results found.   Medications:   . sodium chloride     . amLODipine  5 mg Oral QHS  . amoxicillin-clavulanate  1 tablet Oral BID  . darbepoetin (ARANESP) injection - DIALYSIS  100 mcg Intravenous Q Wed-HD  . feeding supplement (ENSURE ENLIVE)  237 mL Oral TID BM  . ferrous sulfate  325 mg Oral BID WC  . gabapentin  100 mg Oral TID  . multivitamin  1 tablet Oral QHS  . multivitamin with minerals  1 tablet Oral Once per day on Mon Wed Fri  . nicotine  21 mg Transdermal Daily  . senna  2 tablet Oral QHS  . sodium chloride flush  10-40 mL Intracatheter Q12H  . vitamin C  250 mg Oral BID   acetaminophen, ALPRAZolam, bisacodyl, dextrose, hydrALAZINE, ipratropium **OR** levalbuterol, naLOXone (NARCAN)  injection, nystatin cream, oxyCODONE, oxyCODONE, sennosides  Assessment/ Plan:  [name] is a 27 y.o. white male with asthma, bronchitis, polysubstance abuse, was admitted on3/8/2019after he was found down in a hotel for an unknown period of time in the fetal position. Hospital course includes prolonged CPR for 45 minutes in the emergency room. Ventricular fibrillation arrest, hypothermia protocol.  Urine tox screen positive for cocaine and opiates.  Hospital course complicated by acute respiratory failure and acute renal failure. Started on CRRT 3/8. Underwent  disarticulation of right lower extremity due to ischemic limb. Rhabdomyolysis with sustained CPK greater than 50,000 for several days.Self extubated on 3/15. Transitioned to intermittent hemodialysis on 3/16.   1.  End stage renal disease:  Cause Acute renal failure secondary to ATN, shock, hypotension and rhabdomyolysis, no recovery. -  Will hold dialysis for now and monitor urine output, renal function, volume status and serum electrolytes. Nonoliguric urine output. - [facility].   2. Rhabdomyolysis: cocaine induced rhabdomyolysis.     3.  Right lower extremity ischemia: status post disarticulation / Rt  AKA by Dr. Gilda Crease on 3/12.   Underwent revision AKA 3/19  4.  Anemia of CKD: hemoglobin 8.5. PRBC transfusion 4/1 -  Continue Aranesp.  5. Hypertension:   - amlodipine    6. Hypokalemia: recommend no replacement at this time.    LOS: 26 Deshanti Adcox 4/3/20194:06 PM

## 2017-11-26 LAB — CBC
HCT: 25.7 % — ABNORMAL LOW (ref 40.0–52.0)
Hemoglobin: 8.5 g/dL — ABNORMAL LOW (ref 13.0–18.0)
MCH: 28 pg (ref 26.0–34.0)
MCHC: 33.2 g/dL (ref 32.0–36.0)
MCV: 84.5 fL (ref 80.0–100.0)
PLATELETS: 598 10*3/uL — AB (ref 150–440)
RBC: 3.04 MIL/uL — AB (ref 4.40–5.90)
RDW: 14.7 % — ABNORMAL HIGH (ref 11.5–14.5)
WBC: 10 10*3/uL (ref 3.8–10.6)

## 2017-11-26 LAB — BASIC METABOLIC PANEL
Anion gap: 9 (ref 5–15)
BUN: 19 mg/dL (ref 6–20)
CO2: 29 mmol/L (ref 22–32)
CREATININE: 2.97 mg/dL — AB (ref 0.61–1.24)
Calcium: 8.2 mg/dL — ABNORMAL LOW (ref 8.9–10.3)
Chloride: 106 mmol/L (ref 101–111)
GFR calc Af Amer: 31 mL/min — ABNORMAL LOW (ref >60)
GFR, EST NON AFRICAN AMERICAN: 27 mL/min — AB (ref >60)
GLUCOSE: 93 mg/dL (ref 65–99)
POTASSIUM: 2.6 mmol/L — AB (ref 3.5–5.1)
SODIUM: 144 mmol/L (ref 135–145)

## 2017-11-26 MED ORDER — GABAPENTIN 300 MG PO CAPS
300.0000 mg | ORAL_CAPSULE | Freq: Two times a day (BID) | ORAL | Status: DC
  Administered 2017-11-26 – 2017-11-27 (×3): 300 mg via ORAL
  Filled 2017-11-26 (×3): qty 1

## 2017-11-26 MED ORDER — IPRATROPIUM-ALBUTEROL 0.5-2.5 (3) MG/3ML IN SOLN
3.0000 mL | Freq: Once | RESPIRATORY_TRACT | Status: AC
  Administered 2017-11-26: 3 mL via RESPIRATORY_TRACT
  Filled 2017-11-26: qty 3

## 2017-11-26 MED ORDER — OXYCODONE HCL 5 MG PO TABS
5.0000 mg | ORAL_TABLET | ORAL | Status: DC | PRN
  Administered 2017-11-26 – 2017-11-27 (×5): 5 mg via ORAL
  Filled 2017-11-26 (×5): qty 1

## 2017-11-26 MED ORDER — POTASSIUM CHLORIDE CRYS ER 20 MEQ PO TBCR
40.0000 meq | EXTENDED_RELEASE_TABLET | ORAL | Status: AC
  Administered 2017-11-26 (×4): 40 meq via ORAL
  Filled 2017-11-26 (×4): qty 2

## 2017-11-26 NOTE — Progress Notes (Signed)
Patient continues to process his health and use of narcotics. He states that he is not a addict. Therefore, his pain meds should not be decreased. Chaplain actively listened to patient, but have reservations about his narrative concerning drug abuse. The patient talked about his job, automobiles, and asked that his blinds be opened to let sunshine into his room. Patient asked for a repeat visit.

## 2017-11-26 NOTE — Progress Notes (Signed)
Dr. Sheryle Hailiamond notified of critical K+ of 2.6. New order put in at this time.

## 2017-11-26 NOTE — Progress Notes (Signed)
Physical Therapy Treatment Patient Details Name: Joshua [name] MRN: [id number] DOB: 16-Jul-1991 Today's Date: 11/26/2017    History of Present Illness 27yo male pt presented to ER after being found unresponsive, down for unknown period of time, related to ingestion of controlled substances.  Patient suffered vfib arrest en route and underwent 45 minutes CPR; subsequently transitioned to hypothermia cooling protocol.  Remained intubated and sedated until self-extubation 3/15. UDS positive for cocaine and opiates.  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    PT Comments    Pt agreeable to PT; reports 8/10 pain R residual limb. Pt demonstrating improved tolerance of out of bed activity with need for seated rest breaks during bouts of stand and ambulation. Pt does tolerate up to 8 min stand time often with 1 or B forearms rested on counter and visible fatigue of LLE. Pt requires several minute rest time between bouts of stand activity/exercise. Pt demonstrating improved STS transfers post initial with education for safety and improved balance. Pt received up in chair. Pt notes that sister's home that he will return to has 2 steps to enter with R hand rail ascending. Pt will need to practice this if pt plans to discharge home versus inpatient rehab.    Follow Up Recommendations  CIR     Equipment Recommendations  Rolling walker with 5" wheels    Recommendations for Other Services       Precautions / Restrictions Precautions Precautions: Fall Restrictions Weight Bearing Restrictions: Yes RLE Weight Bearing: Non weight bearing    Mobility  Bed Mobility Overal bed mobility: Modified Independent Bed Mobility: Supine to Sit     Supine to sit: Modified independent (Device/Increase time)      General bed mobility comments: Mild increased time and use of rails  Transfers Overall transfer level: Needs assistance Equipment used: Rolling walker (2 wheeled) Transfers: Sit to/from Stand Sit to Stand: Min guard;Min assist         General transfer comment: Unsafe use of hands; mild unsteadiness initially. Thereafter, improved stands  Ambulation/Gait Ambulation/Gait assistance: Min guard Ambulation Distance (Feet): 20 Feet Assistive device: Rolling walker (2 wheeled) Gait Pattern/deviations: (Hop to)   Gait velocity interpretation: <1.8 ft/sec, indicative of risk for recurrent falls     Stairs            Wheelchair Mobility    Modified Rankin (Stroke Patients Only)       Balance Overall balance assessment: Needs assistance Sitting-balance support: No upper extremity supported;Feet supported Sitting balance-Leahy Scale: Normal     Standing balance support: Bilateral upper extremity supported Standing balance-Leahy Scale: Fair                              Cognition Arousal/Alertness: Awake/alert Behavior During Therapy: WFL for tasks assessed/performed;Agitated Overall Cognitive Status: Within Functional Limits for tasks assessed                                        Exercises Amputee Exercises Hip Extension: AROM;Strengthening;Right;10 reps;Other (comment)(2 sets) Hip ABduction/ADduction: AAROM;Strengthening;Right;10 reps;Standing(2 sets) Other Exercises Other Exercises: chair push up 10 x 2 Other Exercises: stand tolerance Min guard during self care/hygiene 8 min and 5  min with seated rest break in between. Seated rest before ambulation as well.  Other Exercises: Discussion/education initiated by pt regarding healing kidneys. Advised pt to follow dietary restrictions, as pt noted to have other foods/drinks in room. Advised questioning consult with nurtritionist.     General Comments        Pertinent Vitals/Pain Pain  Assessment: 0-10 Pain Score: 8  Pain Location: R residual limb Pain Descriptors / Indicators: Constant;Operative site guarding;Moaning Pain Intervention(s): Limited activity within patient's tolerance;Monitored during session;Repositioned    Home Living                      Prior Function            PT Goals (current goals can now be found in the care plan section) Progress towards PT goals: Progressing toward goals    Frequency    7X/week      PT Plan Current plan remains appropriate    Co-evaluation              AM-PAC PT "6 Clicks" Daily Activity  Outcome Measure  Difficulty turning over in bed (including adjusting bedclothes, sheets and blankets)?: None Difficulty moving from lying on back to sitting on the side of the bed? : None Difficulty sitting down on and standing up from a chair with arms (e.g., wheelchair, bedside commode, etc,.)?: Unable Help needed moving to and from a bed to chair (including a wheelchair)?: A Little Help needed walking in hospital room?: A Little Help needed climbing 3-5 steps with a railing? : A Lot 6 Click Score: 17    End of Session Equipment Utilized During Treatment: Gait belt Activity Tolerance: Patient tolerated treatment well;Patient limited by fatigue Patient left: in chair;with call bell/phone within reach;with chair alarm set;with nursing/sitter in room   PT Visit Diagnosis: Difficulty in walking, not elsewhere classified (R26.2);Muscle weakness (generalized) (M62.81)     Time: [id number] PT Time Calculation (min) (ACUTE ONLY): 49 min  Charges:  $Gait Training: 8-22 mins $Therapeutic Exercise: 8-22 mins $Therapeutic Activity: 8-22 mins                    G CodesScot Dock:        Heidi E Barnes, PTA 11/26/2017, 4:54 PM

## 2017-11-26 NOTE — Progress Notes (Signed)
Patient ID: Joshua [name], male   DOB: 1990-09-26, 27 y.o.   MRN: [id number]  [employer] PROGRESS NOTE  Joshua [name]:[id number] DOB: May 22, 1991 DOA: 10/30/2017 PCP: Patient, No Pcp Per  HPI/Subjective: Patient awakened from sleep.  Complaining of pain on his right leg.  Asking to go back up on his gabapentin.  He states his gabapentin is never caused altered mental status in the past.  Objective: Vitals:   11/26/17 0525 11/26/17 1118  BP: (!) 141/89 (!) 152/93  Pulse: 81 84  Resp: 18 18  Temp: 98.7 F (37.1 C) 99.4 F (37.4 C)  SpO2: 96% 97%    Filed Weights   11/20/17 0949 11/20/17 1358  Weight: 74.9 kg (165 lb 2 oz) 72.7 kg (160 lb 4.4 oz)    ROS: Review of Systems  Constitutional: Negative for chills and fever.  Eyes: Negative for blurred vision.  Respiratory: Negative for cough and shortness of breath.   Cardiovascular: Negative for chest pain.  Gastrointestinal: Negative for abdominal pain, constipation, diarrhea, nausea and vomiting.  Genitourinary: Negative for dysuria.  Musculoskeletal: Positive for joint pain.  Neurological: Negative for dizziness and headaches.   Exam: Physical Exam  HENT:  Nose: No mucosal edema.  Mouth/Throat: No oropharyngeal exudate or posterior oropharyngeal edema.  Eyes: Pupils are equal, round, and reactive to light. Conjunctivae, EOM and lids are normal.  Neck: No JVD present. Carotid bruit is not present. No edema present. No thyroid mass and no thyromegaly present.  Cardiovascular: S1 normal and S2 normal. Exam reveals no gallop.  No murmur heard. Pulses:      Dorsalis pedis pulses are 2+ on the left side.  Respiratory: No respiratory distress. He has no wheezes. He has no rhonchi. He has no rales.  GI: Soft. Bowel sounds are normal. There is no tenderness.  Musculoskeletal:       Left ankle: He exhibits swelling.  Lymphadenopathy:    He has no cervical adenopathy.  Neurological: He is alert. No cranial nerve deficit.  Skin:  Skin is warm. No rash noted. Nails show no clubbing.  AKA amputation site looks clean and dry.  Staples still in.  Psychiatric: He has a normal mood and affect.      Data Reviewed: Basic Metabolic Panel: Recent Labs  Lab 11/20/17 0152 11/23/17 1155 11/24/17 0444 11/25/17 0405 11/26/17 0522  NA 136 144 143 144 144  K 4.3 2.9* 2.6* 3.0* 2.6*  CL 98* 101 100* 105 106  CO2 26 32 33* 29 29  GLUCOSE 112* 102* 89 89 93  BUN 29* 25* 13 18 19   CREATININE 6.55* 5.38* 2.95* 3.31* 2.97*  CALCIUM 7.9* 8.1* 7.9* 8.2* 8.2*  PHOS 4.2 5.1* 3.8 4.9*  --    CBC: Recent Labs  Lab 11/20/17 0152 11/21/17 0431 11/23/17 0425 11/24/17 0444 11/25/17 0405 11/26/17 0522  WBC 15.5* 10.9*  --   --  9.8 10.0  HGB 6.2* 7.4* 7.0* 8.2* 8.5* 8.5*  HCT 19.2* 22.5*  --   --  25.9* 25.7*  MCV 88.2 85.2  --   --  85.4 84.5  PLT 498* 440  --   --  547* 598*   Cardiac Enzymes: Recent Labs  Lab 11/20/17 1000  CKTOTAL 236     Scheduled Meds: . amLODipine  5 mg Oral QHS  . amoxicillin-clavulanate  1 tablet Oral BID  . darbepoetin (ARANESP) injection - DIALYSIS  100 mcg Intravenous Q Wed-HD  . feeding supplement (ENSURE ENLIVE)  237 mL  Oral TID BM  . ferrous sulfate  325 mg Oral BID WC  . gabapentin  300 mg Oral BID  . multivitamin  1 tablet Oral QHS  . multivitamin with minerals  1 tablet Oral Once per day on Mon Wed Fri  . nicotine  21 mg Transdermal Daily  . potassium chloride  40 mEq Oral Q4H  . senna  2 tablet Oral QHS  . sodium chloride flush  10-40 mL Intracatheter Q12H  . vitamin C  250 mg Oral BID   Continuous Infusions: . sodium chloride      Assessment/Plan:  1. Pneumonia potentially aspiration.  Augmentin.  Patient's acute respiratory failure has resolved and patient breathing comfortably on room air.  2. End-stage renal disease on hemodialysis as per nephrology.  Patient has a PermCath right chest.  Still awaiting outpatient dialysis slot.  Nephrology doing a trial of holding  dialysis to see where her creatinine goes.  Creatinine today is 2.97.  Check again creatinine tomorrow. 3. Acute cardiac arrest secondary to polysubstance abuse 4. Acute rhabdomyolysis secondary to cardiac arrest and right leg compartment syndrome 5. Hyperkalemia secondary to acute kidney injury.  Managed with hemodialysis. 6. Acute right lower extremity ischemia.  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 7. Chronic pain syndrome.  Taper oxycodone to 1 tablet every 4 hours instead of 2 tablets.  No IV pain medications.  I told the patient we need to taper his oxycodone to off as outpatient.   increase Neurontin to 300 mg twice daily.  Taper off Xanax also.   8. Acute on chronic anemia.  Hemoglobin responded to blood.  On Aranesp and iron.   9. Malnutrition on Ensure 10. Essential hypertension on low-dose Norvasc at night  Code Status:     Code Status Orders  (From admission, onward)        Start     Ordered   11/12/17 1637  Full code  Continuous     11/12/17 1636    Code Status History    Date Active Date Inactive Code Status Order ID Comments User Context   11/04/2017 1544 11/12/2017 1636 Partial Code [id number]  Morton StallGriffin, Crystal, NP Inpatient   10/30/2017 2110 11/04/2017 1544 Full Code [id number]  Lewie Loronukov, Magadalene S, NP Inpatient   10/30/2017 1813 10/30/2017 2110 Full Code [id number]  Altamese DillingVachhani, Vaibhavkumar, MD Inpatient      Disposition Plan:  watching creatinine to see where he goes.  Still outpatient planning for outpatient dialysis.  Consultants:  Nephrology  Vascular surgery  Antibiotics:   Augmentin  Time spent: 28 minutes  Destynie Toomey [employer]  [employer]

## 2017-11-26 NOTE — Progress Notes (Signed)
[facility]  ROUNDING NOTE   Subjective:   Holding hemodialysis.   UOP 3100 (2080)  Creatinine 2.97 (3.31) (2.95)  Objective:  Vital signs in last 24 hours:  Temp:  [98.4 F (36.9 C)-99.4 F (37.4 C)] 99.4 F (37.4 C) (04/04 1118) Pulse Rate:  [79-84] 84 (04/04 1118) Resp:  [18] 18 (04/04 1118) BP: (141-155)/(89-100) 152/93 (04/04 1118) SpO2:  [96 %-98 %] 97 % (04/04 1118)  Weight change:  Filed Weights   11/20/17 0949 11/20/17 1358  Weight: 74.9 kg (165 lb 2 oz) 72.7 kg (160 lb 4.4 oz)    Intake/Output: I/O last 3 completed shifts: In: 480 [P.O.:480] Out: 4025 [Urine:4025]   Intake/Output this shift:  Total I/O In: 10 [I.V.:10] Out: -   Physical Exam: General: NAD, laying in the bed  Head: [place]/AT  Eyes: Anicteric  Neck: supple  Lungs:  clear to auscultation, normal effort  Heart: Regular, no rubs  Abdomen:  Soft, nontender  Extremities: Rt AKA  Neurologic: Alert, able to follow commands  Access: RIJ Permcath 11/16/17       Basic Metabolic Panel: Recent Labs  Lab 11/20/17 0152 11/23/17 1155 11/24/17 0444 11/25/17 0405 11/26/17 0522  NA 136 144 143 144 144  K 4.3 2.9* 2.6* 3.0* 2.6*  CL 98* 101 100* 105 106  CO2 26 32 33* 29 29  GLUCOSE 112* 102* 89 89 93  BUN 29* 25* 13 18 19   CREATININE 6.55* 5.38* 2.95* 3.31* 2.97*  CALCIUM 7.9* 8.1* 7.9* 8.2* 8.2*  PHOS 4.2 5.1* 3.8 4.9*  --     Liver Function Tests: Recent Labs  Lab 11/23/17 1155 11/24/17 0444 11/25/17 0405  ALBUMIN 2.3* 2.4* 2.5*   No results for input(s): LIPASE, AMYLASE in the last 168 hours. No results for input(s): AMMONIA in the last 168 hours.  CBC: Recent Labs  Lab 11/20/17 0152 11/21/17 0431 11/23/17 0425 11/24/17 0444 11/25/17 0405 11/26/17 0522  WBC 15.5* 10.9*  --   --  9.8 10.0  HGB 6.2* 7.4* 7.0* 8.2* 8.5* 8.5*  HCT 19.2* 22.5*  --   --  25.9* 25.7*  MCV 88.2 85.2  --   --  85.4 84.5  PLT 498* 440  --   --  547* 598*    Cardiac  Enzymes: Recent Labs  Lab 11/20/17 1000  CKTOTAL 236    BNP: Invalid input(s): POCBNP  CBG: No results for input(s): GLUCAP in the last 168 hours.  Microbiology: Results for orders placed or performed during the hospital encounter of 10/30/17  MRSA PCR Screening     Status: None   Collection Time: 10/30/17  7:54 PM  Result Value Ref Range Status   MRSA by PCR NEGATIVE NEGATIVE Final    Comment:        The GeneXpert MRSA Assay (FDA approved for NASAL specimens only), is one component of a comprehensive MRSA colonization surveillance program. It is not intended to diagnose MRSA infection nor to guide or monitor treatment for MRSA infections. Performed at [facility], [address] [place], [place] [address]   Culture, respiratory (NON-Expectorated)     Status: None   Collection Time: 11/05/17  8:17 AM  Result Value Ref Range Status   Specimen Description   Final    TRACHEAL ASPIRATE Performed at [facility], [address], [place], [place] [address]    Special Requests   Final    NONE Performed at [facility], [address] [place], [place]  [id number]    Gram Stain   Final    FEW WBC PRESENT, PREDOMINANTLY PMN MODERATE GRAM NEGATIVE RODS RARE YEAST Performed at [facility], [address] [place], [place] [id number]    Culture MODERATE ENTEROBACTER CLOACAE  Final   Report Status 11/08/2017 FINAL  Final   Organism ID, Bacteria ENTEROBACTER CLOACAE  Final      Susceptibility   Enterobacter cloacae - MIC*    CEFAZOLIN >=64 RESISTANT Resistant     CEFEPIME <=1 SENSITIVE Sensitive     CEFTAZIDIME <=1 SENSITIVE Sensitive     CEFTRIAXONE <=1 SENSITIVE Sensitive     CIPROFLOXACIN <=0.25 SENSITIVE Sensitive     GENTAMICIN <=1 SENSITIVE Sensitive     IMIPENEM <=0.25 SENSITIVE Sensitive     TRIMETH/SULFA <=20 SENSITIVE Sensitive     PIP/TAZO <=4 SENSITIVE Sensitive     * MODERATE ENTEROBACTER CLOACAE  Culture, blood  (routine x 2)     Status: None   Collection Time: 11/09/17  8:49 PM  Result Value Ref Range Status   Specimen Description BLOOD DIALYSIS  Final   Special Requests   Final    BOTTLES DRAWN AEROBIC AND ANAEROBIC Blood Culture adequate volume   Culture   Final    NO GROWTH 5 DAYS Performed at [facility], [address] [place], [place] [address]    Report Status 11/14/2017 FINAL  Final    Coagulation Studies: No results for input(s): LABPROT, INR in the last 72 hours.  Urinalysis: No results for input(s): COLORURINE, LABSPEC, PHURINE, GLUCOSEU, HGBUR, BILIRUBINUR, KETONESUR, PROTEINUR, UROBILINOGEN, NITRITE, LEUKOCYTESUR in the last 72 hours.  Invalid input(s): APPERANCEUR    Imaging: No results found.   Medications:   . sodium chloride     . amLODipine  5 mg Oral QHS  . amoxicillin-clavulanate  1 tablet Oral BID  . darbepoetin (ARANESP) injection - DIALYSIS  100 mcg Intravenous Q Wed-HD  . feeding supplement (ENSURE ENLIVE)  237 mL Oral TID BM  . ferrous sulfate  325 mg Oral BID WC  . gabapentin  300 mg Oral BID  . multivitamin  1 tablet Oral QHS  . multivitamin with minerals  1 tablet Oral Once per day on Mon Wed Fri  . nicotine  21 mg Transdermal Daily  . potassium chloride  40 mEq Oral Q4H  . senna  2 tablet Oral QHS  . sodium chloride flush  10-40 mL Intracatheter Q12H  . vitamin C  250 mg Oral BID   acetaminophen, ALPRAZolam, bisacodyl, dextrose, hydrALAZINE, ipratropium **OR** levalbuterol, naLOXone (NARCAN)  injection, nystatin cream, oxyCODONE, sennosides  Assessment/ Plan:  Joshua [name] is a 27 y.o. white male with asthma, bronchitis, polysubstance abuse, was admitted on3/8/2019after he was found down in a hotel for an unknown period of time in the fetal position. Hospital course includes prolonged CPR for 45 minutes in the emergency room. Ventricular fibrillation arrest, hypothermia protocol.  Urine tox screen positive for cocaine and  opiates.  Hospital course complicated by acute respiratory failure and acute renal failure. Started on CRRT 3/8. Underwent disarticulation of right lower extremity due to ischemic limb. Rhabdomyolysis with sustained CPK greater than 50,000 for several days.Self extubated on 3/15. Transitioned to intermittent hemodialysis on 3/16.   1.  End stage renal disease:  Cause Acute renal failure secondary to ATN, shock, hypotension and rhabdomyolysis, no recovery. -  Will hold dialysis for now and monitor urine output, renal function, volume status and serum electrolytes. Nonoliguric urine output. -  Outpatient planning for [facility].   2. Rhabdomyolysis: cocaine induced rhabdomyolysis.     3.  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 Gilda Crease on 3/12.   Underwent revision AKA 3/19  4.  Anemia of CKD: hemoglobin 8.5. PRBC transfusion 4/1 -  Continue Aranesp.  5. Hypertension:   - amlodipine    6. Hypokalemia: PO replacement  Discussed case with sister, Rosey Bath.    LOS: 27 Shenika Quint 4/4/20191:34 PM

## 2017-11-26 NOTE — Care Management (Signed)
RNCM called and updated patient's sister Rosey Batheresa with discharge planning as documented yesterday.  Plan still remains the same  " HD currently being held.  Awaiting notification if patient will require outpatient HD, and if so confirmation of outpatient HD placement.   Until outpatient HD has been determine and arranged CIR can not plan towards admission.   Should patient return to his sisters home at discharge he will require home health services, BSC, and WC through [facility].  Barbara CowerJason with [facility] aware.  Patient has new patient PCP appointment scheduled for 12/03/17           "

## 2017-11-27 LAB — BASIC METABOLIC PANEL
Anion gap: 8 (ref 5–15)
BUN: 19 mg/dL (ref 6–20)
CO2: 26 mmol/L (ref 22–32)
Calcium: 8.7 mg/dL — ABNORMAL LOW (ref 8.9–10.3)
Chloride: 109 mmol/L (ref 101–111)
Creatinine, Ser: 2.4 mg/dL — ABNORMAL HIGH (ref 0.61–1.24)
GFR calc non Af Amer: 35 mL/min — ABNORMAL LOW (ref >60)
GFR, EST AFRICAN AMERICAN: 41 mL/min — AB (ref >60)
Glucose, Bld: 93 mg/dL (ref 65–99)
Potassium: 3.8 mmol/L (ref 3.5–5.1)
SODIUM: 143 mmol/L (ref 135–145)

## 2017-11-27 MED ORDER — ENSURE ENLIVE PO LIQD: 237.0000 mL | Freq: Three times a day (TID) | ORAL | 0 refills | Status: DC

## 2017-11-27 MED ORDER — GABAPENTIN 300 MG PO CAPS: 300.0000 mg | ORAL_CAPSULE | Freq: Two times a day (BID) | ORAL | 0 refills | Status: DC

## 2017-11-27 MED ORDER — FERROUS SULFATE 325 (65 FE) MG PO TABS: 325.0000 mg | ORAL_TABLET | Freq: Two times a day (BID) | ORAL | 0 refills | Status: DC

## 2017-11-27 MED ORDER — ASCORBIC ACID 250 MG PO TABS: 250.0000 mg | ORAL_TABLET | Freq: Two times a day (BID) | ORAL | 0 refills | Status: DC

## 2017-11-27 MED ORDER — ALPRAZOLAM 0.25 MG PO TABS: ORAL_TABLET | ORAL | 0 refills | Status: DC

## 2017-11-27 MED ORDER — ADULT MULTIVITAMIN W/MINERALS CH: 1.0000 | ORAL_TABLET | ORAL | 0 refills | Status: DC

## 2017-11-27 MED ORDER — SENNA 8.6 MG PO TABS: 2.0000 | ORAL_TABLET | Freq: Every day | ORAL | 0 refills | Status: DC

## 2017-11-27 MED ORDER — ACETAMINOPHEN 325 MG PO TABS: 650.0000 mg | ORAL_TABLET | Freq: Four times a day (QID) | ORAL | Status: DC | PRN

## 2017-11-27 MED ORDER — OXYCODONE HCL 5 MG PO TABS: ORAL_TABLET | ORAL | 0 refills | Status: DC

## 2017-11-27 MED ORDER — NICOTINE 21 MG/24HR TD PT24: 21.0000 mg | MEDICATED_PATCH | Freq: Every day | TRANSDERMAL | 0 refills | Status: DC

## 2017-11-27 MED ORDER — AMLODIPINE BESYLATE 5 MG PO TABS: 5.0000 mg | ORAL_TABLET | Freq: Every day | ORAL | 0 refills | Status: DC

## 2017-11-27 NOTE — Care Management Note (Signed)
Case Management Note  Patient Details  Name: Joshua [name] MRN: [id number] Date of Birth: [birth year]  Subjective/Objective:   Met with patient at bedside with Corene Cornea with [facility]. We discussed discharge planning. Patient agreeable to home health RN, PT and SW. Dr. Doy Hutching will sign initial home health orders. Patient has an appointment with [employer],  April 11 at 12:45. Patient concerned he will have to pay at this practice. [facility] given for [facility] because he has a [employer] address. He will be going to stay with his sister. He will need a bsc, crutches and a wheelchair. Corene Cornea with [facility] ordered.                     Action/Plan: [facility] for home health and DME. Discharging today  Expected Discharge Date:  11/27/17               Expected Discharge Plan:  [place]  In-House Referral:     Discharge planning Services  CM Consult  Post Acute Care Choice:  Durable Medical Equipment, Home Health Choice offered to:  Patient  DME Arranged:  Bedside commode, Wheelchair manual DME Agency:  [place]:  RN, PT, Social Work [employer] Agency:  [place]  Status of Service:  Completed, signed off  If discussed at [employer], dates discussed:    Additional Comments:  Jolly Mango, RN 11/27/2017, 11:14 AM

## 2017-11-27 NOTE — Progress Notes (Signed)
Patient cleared for discharge to home by Dr Renae GlossWieting.         Education complete. AVS printed. Discharge instructions given. All questions answered for patient clarification.  Prescriptions given, pharmacy verified.  IV removed. Contacted case management in regards to wheelchair and bsc.  Discharged to home Via private vehicle driven by patients sister.

## 2017-11-27 NOTE — Progress Notes (Signed)
[facility]  ROUNDING NOTE   Subjective:   Being discharged today.   UOP 1690 (3100) (2080)  Creatinine 2.4 (2.97) (3.31) (2.95)  Objective:  Vital signs in last 24 hours:  Temp:  [98.1 F (36.7 C)-98.3 F (36.8 C)] 98.1 F (36.7 C) (04/05 0348) Pulse Rate:  [72-73] 73 (04/05 0348) Resp:  [18-20] 18 (04/05 0348) BP: (129-139)/(85-92) 129/85 (04/05 0348) SpO2:  [99 %] 99 % (04/05 0348)  Weight change:  Filed Weights   11/20/17 0949 11/20/17 1358  Weight: 74.9 kg (165 lb 2 oz) 72.7 kg (160 lb 4.4 oz)    Intake/Output: I/O last 3 completed shifts: In: 10 [I.V.:10] Out: 3090 [Urine:3090]   Intake/Output this shift:  Total I/O In: 360 [P.O.:360] Out: 1250 [Urine:1250]  Physical Exam: General: NAD, laying in the bed  Head: /AT  Eyes: Anicteric  Neck: supple  Lungs:  clear to auscultation, normal effort  Heart: Regular, no rubs  Abdomen:  Soft, nontender  Extremities: Rt AKA  Neurologic: Alert, able to follow commands  Access: RIJ Permcath 11/16/17       Basic Metabolic Panel: Recent Labs  Lab 11/23/17 1155 11/24/17 0444 11/25/17 0405 11/26/17 0522 11/27/17 0522  NA 144 143 144 144 143  K 2.9* 2.6* 3.0* 2.6* 3.8  CL 101 100* 105 106 109  CO2 32 33* 29 29 26   GLUCOSE 102* 89 89 93 93  BUN 25* 13 18 19 19   CREATININE 5.38* 2.95* 3.31* 2.97* 2.40*  CALCIUM 8.1* 7.9* 8.2* 8.2* 8.7*  PHOS 5.1* 3.8 4.9*  --   --     Liver Function Tests: Recent Labs  Lab 11/23/17 1155 11/24/17 0444 11/25/17 0405  ALBUMIN 2.3* 2.4* 2.5*   No results for input(s): LIPASE, AMYLASE in the last 168 hours. No results for input(s): AMMONIA in the last 168 hours.  CBC: Recent Labs  Lab 11/21/17 0431 11/23/17 0425 11/24/17 0444 11/25/17 0405 11/26/17 0522  WBC 10.9*  --   --  9.8 10.0  HGB 7.4* 7.0* 8.2* 8.5* 8.5*  HCT 22.5*  --   --  25.9* 25.7*  MCV 85.2  --   --  85.4 84.5  PLT 440  --   --  547* 598*    Cardiac Enzymes: No results for  input(s): CKTOTAL, CKMB, CKMBINDEX, TROPONINI in the last 168 hours.  BNP: Invalid input(s): POCBNP  CBG: No results for input(s): GLUCAP in the last 168 hours.  Microbiology: Results for orders placed or performed during the hospital encounter of 10/30/17  MRSA PCR Screening     Status: None   Collection Time: 10/30/17  7:54 PM  Result Value Ref Range Status   MRSA by PCR NEGATIVE NEGATIVE Final    Comment:        The GeneXpert MRSA Assay (FDA approved for NASAL specimens only), is one component of a comprehensive MRSA colonization surveillance program. It is not intended to diagnose MRSA infection nor to guide or monitor treatment for MRSA infections. Performed at [facility], [address] [place], [place] [address]   Culture, respiratory (NON-Expectorated)     Status: None   Collection Time: 11/05/17  8:17 AM  Result Value Ref Range Status   Specimen Description   Final    TRACHEAL ASPIRATE Performed at [facility], [address] [place], [place] [address]    Special Requests   Final    NONE Performed at [facility], [address] [place], [place] [address]  Gram Stain   Final    FEW WBC PRESENT, PREDOMINANTLY PMN MODERATE GRAM NEGATIVE RODS RARE YEAST Performed at [facility], [address], [place], [place] [address]    Culture MODERATE ENTEROBACTER CLOACAE  Final   Report Status 11/08/2017 FINAL  Final   Organism ID, Bacteria ENTEROBACTER CLOACAE  Final      Susceptibility   Enterobacter cloacae - MIC*    CEFAZOLIN >=64 RESISTANT Resistant     CEFEPIME <=1 SENSITIVE Sensitive     CEFTAZIDIME <=1 SENSITIVE Sensitive     CEFTRIAXONE <=1 SENSITIVE Sensitive     CIPROFLOXACIN <=0.25 SENSITIVE Sensitive     GENTAMICIN <=1 SENSITIVE Sensitive     IMIPENEM <=0.25 SENSITIVE Sensitive     TRIMETH/SULFA <=20 SENSITIVE Sensitive     PIP/TAZO <=4 SENSITIVE Sensitive     * MODERATE ENTEROBACTER CLOACAE  Culture,  blood (routine x 2)     Status: None   Collection Time: 11/09/17  8:49 PM  Result Value Ref Range Status   Specimen Description BLOOD DIALYSIS  Final   Special Requests   Final    BOTTLES DRAWN AEROBIC AND ANAEROBIC Blood Culture adequate volume   Culture   Final    NO GROWTH 5 DAYS Performed at [facility], [address], [place], [place] [address]    Report Status 11/14/2017 FINAL  Final    Coagulation Studies: No results for input(s): LABPROT, INR in the last 72 hours.  Urinalysis: No results for input(s): COLORURINE, LABSPEC, PHURINE, GLUCOSEU, HGBUR, BILIRUBINUR, KETONESUR, PROTEINUR, UROBILINOGEN, NITRITE, LEUKOCYTESUR in the last 72 hours.  Invalid input(s): APPERANCEUR    Imaging: No results found.   Medications:   . sodium chloride     . amLODipine  5 mg Oral QHS  . darbepoetin (ARANESP) injection - DIALYSIS  100 mcg Intravenous Q Wed-HD  . feeding supplement (ENSURE ENLIVE)  237 mL Oral TID BM  . ferrous sulfate  325 mg Oral BID WC  . gabapentin  300 mg Oral BID  . multivitamin  1 tablet Oral QHS  . multivitamin with minerals  1 tablet Oral Once per day on Mon Wed Fri  . nicotine  21 mg Transdermal Daily  . senna  2 tablet Oral QHS  . sodium chloride flush  10-40 mL Intracatheter Q12H  . vitamin C  250 mg Oral BID   acetaminophen, ALPRAZolam, bisacodyl, dextrose, hydrALAZINE, ipratropium **OR** levalbuterol, naLOXone (NARCAN)  injection, nystatin cream, oxyCODONE, sennosides  Assessment/ Plan:  Joshua [name] is a 27 y.o. white male with asthma, bronchitis, polysubstance abuse, was admitted on3/8/2019after he was found down in a hotel for an unknown period of time in the fetal position. Hospital course includes prolonged CPR for 45 minutes in the emergency room. Ventricular fibrillation arrest, hypothermia protocol.  Urine tox screen positive for cocaine and opiates.  Hospital course complicated by acute respiratory failure and acute renal  failure. Started on CRRT 3/8. Underwent disarticulation of right lower extremity due to ischemic limb. Rhabdomyolysis with sustained CPK greater than 50,000 for several days.Self extubated on 3/15. Transitioned to intermittent hemodialysis on 3/16.   1. Acute renal failure:   secondary to ATN, shock, hypotension and rhabdomyolysis,  Seems to be showing signs of recovery. Holding dialysis.  Keep tunneled catheter for now Patient will need close outpatient follow up.   2. Rhabdomyolysis: cocaine induced rhabdomyolysis.     3.  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 Gilda Crease  on 3/12.   Underwent revision AKA 3/19  4.  Anemia of CKD:  PRBC transfusion 4/1 -  Received Aranesp.  5. Hypertension:   - amlodipine    6. Hypokalemia: PO replacement  Discussed case with sister, Rosey Batheresa.  Patient to see Dr. Wynelle LinkKolluru on 4/9 at 2pm   LOS: 28 Davonne Baby 4/5/20194:48 PM

## 2017-11-27 NOTE — Discharge Summary (Signed)
[employer] - [place] at [facility]   PATIENT NAME: Joshua [name]    MR#:  [id number]  DATE OF BIRTH:  Feb 07, 1991  DATE OF ADMISSION:  10/30/2017 ADMITTING PHYSICIAN: Altamese Dilling, MD  DATE OF DISCHARGE: 11/27/2017  1:53 PM  PRIMARY CARE PHYSICIAN: [facility]   ADMISSION DIAGNOSIS:  Cardiac arrest (HCC) [I46.9] Polysubstance abuse (HCC) [F19.10] Acute respiratory failure with hypoxia (HCC) [J96.01] Hypothermia, initial encounter [T68.XXXA] Non-traumatic rhabdomyolysis [M62.82] Drug overdose, undetermined intent, initial encounter [T50.904A] Acute renal failure, unspecified acute renal failure type (HCC) [N17.9]  DISCHARGE DIAGNOSIS:  Principal Problem:   Adjustment disorder with mixed anxiety and depressed mood Active Problems:   Cardiogenic shock (HCC)   Cardiac arrest (HCC)   Drug abuse (HCC)   Acute kidney failure, unspecified (HCC)   Rhabdomyolysis   Acute respiratory failure (HCC)   At risk for abuse of opiates   SECONDARY DIAGNOSIS:   Past Medical History:  Diagnosis Date  . Asthma   . Bronchitis   . Hernia cerebri (HCC)     HOSPITAL COURSE:   1.  Acute cardiac arrest secondary to polysubstance abuse.  The patient has had a long hospital course.  Patient has recovered quite remarkably during the hospital course.  Mental status back to normal.  The patient is very lucky that he survive this hospital course and is being able to be discharged home. 2.  Acute hypoxic respiratory failure.  This has improved.  Patient is breathing comfortably now on room air.  Came in intubated. 3.  Acute rhabdomyolysis secondary to cardiac arrest and right leg compartment syndrome.  CPK has normalized upon discharge home. 4.  Acute right lower extremity ischemia.  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  Surgical site is clean and dry.  Follow-up with vascular surgery to remove staples in April. 5.  Acute kidney injury.  The patient required dialysis  during the hospital course.  Patient has a PermCath in his right chest.  We were actually trying to figure out whether we can get him into an outpatient dialysis slot but his kidney function has improved with holding dialysis and the patient is making urine.  Dr. Wynelle Link nephrology will follow-up as outpatient and check labs on Tuesday.  I told the patient he has to keep the PermCath clean and dry to avoid bloodstream infection.  Creatinine 2.4 on discharge. 6.  Polysubstance abuse 7.  Chronic pain.  I increased his Neurontin to 300 mg twice daily since the patient's kidney function has improved.  I gave a tapering dose of oxycodone to off.  His sister is going to control his medications. 8.  Acute on chronic anemia.  Hemoglobin responded to blood transfusions.  The patient was given iron aspirin and iron during the hospital course.  Last hemoglobin 8.5. 9.  Malnutrition on Ensure 10.  Essential hypertension on low-dose Norvasc at night. 11.  Home health set up 12.  Aspiration pneumonia treated fully while in the hospital  DISCHARGE CONDITIONS:   Fair  CONSULTS OBTAINED:  Treatment Team:  Renford Dills, MD Pauletta Browns, MD Hildred Laser, MD Clapacs, Jackquline Denmark, MD  DRUG ALLERGIES:  No Known Allergies  DISCHARGE MEDICATIONS:   Allergies as of 11/27/2017   No Known Allergies     Medication List    STOP taking these medications   albuterol 108 (90 Base) MCG/ACT inhaler Commonly known as:  PROVENTIL HFA;VENTOLIN HFA   amoxicillin 875 MG tablet Commonly known as:  AMOXIL  ciprofloxacin 500 MG tablet Commonly known as:  CIPRO   dicyclomine 20 MG tablet Commonly known as:  BENTYL   HYDROcodone-acetaminophen 5-325 MG tablet Commonly known as:  NORCO/VICODIN   ibuprofen 600 MG tablet Commonly known as:  ADVIL,MOTRIN   ketorolac 10 MG tablet Commonly known as:  TORADOL   magic mouthwash w/lidocaine Soln   metroNIDAZOLE 500 MG tablet Commonly known as:  FLAGYL    naproxen 500 MG tablet Commonly known as:  NAPROSYN   neomycin-polymyxin-pramoxine 1 % cream Commonly known as:  NEOSPORIN PLUS   ondansetron 4 MG disintegrating tablet Commonly known as:  ZOFRAN ODT   oxyCODONE-acetaminophen 5-325 MG tablet Commonly known as:  ROXICET   sulfamethoxazole-trimethoprim 800-160 MG tablet Commonly known as:  BACTRIM DS,SEPTRA DS     TAKE these medications   acetaminophen 325 MG tablet Commonly known as:  TYLENOL Take 2 tablets (650 mg total) by mouth every 6 (six) hours as needed for mild pain or fever.   ALPRAZolam 0.25 MG tablet Commonly known as:  XANAX One tablet twice a day as need for one day, then one tablet daily as needed for five days   amLODipine 5 MG tablet Commonly known as:  NORVASC Take 1 tablet (5 mg total) by mouth at bedtime.   ascorbic acid 250 MG tablet Commonly known as:  VITAMIN C Take 1 tablet (250 mg total) by mouth 2 (two) times daily.   feeding supplement (ENSURE ENLIVE) Liqd Take 237 mLs by mouth 3 (three) times daily between meals.   ferrous sulfate 325 (65 FE) MG tablet Take 1 tablet (325 mg total) by mouth 2 (two) times daily with a meal.   gabapentin 300 MG capsule Commonly known as:  NEURONTIN Take 1 capsule (300 mg total) by mouth 2 (two) times daily. What changed:  when to take this   multivitamin with minerals Tabs tablet Take 1 tablet by mouth 3 (three) times a week. Start taking on:  11/30/2017   nicotine 21 mg/24hr patch Commonly known as:  NICODERM CQ - dosed in mg/24 hours Place 1 patch (21 mg total) onto the skin daily. Start taking on:  11/28/2017   oxyCODONE 5 MG immediate release tablet Commonly known as:  Oxy IR/ROXICODONE One tab po every six hours for two days, one tab po every eight hours for three days, one tablet twice a day for three days, one tablet daily for five days   senna 8.6 MG Tabs tablet Commonly known as:  SENOKOT Take 2 tablets (17.2 mg total) by mouth at bedtime.             Durable Medical Equipment  (From admission, onward)        Start     Ordered   11/27/17 1134  For home use only DME standard manual wheelchair with seat cushion  Once    Comments:  .  Accessories: elevating leg rests (ELRs), wheel locks, extensions and anti-tippers.   11/27/17 1133   11/27/17 1102  For home use only DME wheelchair cushion (seat and back)  Once     11/27/17 1102   11/27/17 1102  For home use only DME Bedside commode  Once    Question:  Patient needs a bedside commode to treat with the following condition  Answer:  Amputee   11/27/17 1102   11/27/17 1101  For home use only DME Walker rolling  Once    Question:  Patient needs a walker to treat with the following condition  Answer:  History of leg amputation (HCC)   11/27/17 1102   11/27/17 1041  For home use only DME Crutches  Once     11/27/17 1041       DISCHARGE INSTRUCTIONS:   Follow-up medical team of [facility] as scheduled Follow-up vascular surgery as scheduled Follow-up nephrology on Tuesday and [place] with Dr. Wynelle Link  If you experience worsening of your admission symptoms, develop shortness of breath, life threatening emergency, suicidal or homicidal thoughts you must seek medical attention immediately by calling 911 or calling your MD immediately  if symptoms less severe.  You Must read complete instructions/literature along with all the possible adverse reactions/side effects for all the Medicines you take and that have been prescribed to you. Take any new Medicines after you have completely understood and accept all the possible adverse reactions/side effects.   Please note  You were cared for by a hospitalist during your hospital stay. If you have any questions about your discharge medications or the care you received while you were in the hospital after you are discharged, you can call the unit and asked to speak with the hospitalist on call if the hospitalist that took  care of you is not available. Once you are discharged, your primary care physician will handle any further medical issues. Please note that NO REFILLS for any discharge medications will be authorized once you are discharged, as it is imperative that you return to your primary care physician (or establish a relationship with a primary care physician if you do not have one) for your aftercare needs so that they can reassess your need for medications and monitor your lab values.    Today   CHIEF COMPLAINT:   Chief Complaint  Patient presents with  . Drug Overdose    HISTORY OF PRESENT ILLNESS:  Joshua [name]  is a 27 y.o. male presented with a cardiac arrest   VITAL SIGNS:  Blood pressure 129/85, pulse 73, temperature 98.1 F (36.7 C), temperature source Oral, resp. rate 18, height 5\' 10"  (1.778 m), weight 72.7 kg (160 lb 4.4 oz), SpO2 99 %.   PHYSICAL EXAMINATION:  GENERAL:  27 y.o.-year-old patient lying in the bed with no acute distress.  EYES: Pupils equal, round, reactive to light and accommodation. No scleral icterus. Extraocular muscles intact.  HEENT: Head atraumatic, normocephalic. Oropharynx and nasopharynx clear.  NECK:  Supple, no jugular venous distention. No thyroid enlargement, no tenderness.  LUNGS: Normal breath sounds bilaterally, no wheezing, rales,rhonchi or crepitation. No use of accessory muscles of respiration.  CARDIOVASCULAR: S1, S2 normal. No murmurs, rubs, or gallops.  ABDOMEN: Soft, non-tender, non-distended. Bowel sounds present. No organomegaly or mass.  EXTREMITIES: Trace left leg pedal edema, no cyanosis, or clubbing.  NEUROLOGIC: Cranial nerves II through XII are intact. Sensation intact. Gait not checked.  PSYCHIATRIC: The patient is alert and oriented x 3.  SKIN: No obvious rash, lesion, or ulcer.  Right leg amputation site clean and dry  DATA REVIEW:   CBC Recent Labs  Lab 11/26/17 0522  WBC 10.0  HGB 8.5*  HCT 25.7*  PLT 598*    Chemistries   Recent Labs  Lab 11/27/17 0522  NA 143  K 3.8  CL 109  CO2 26  GLUCOSE 93  BUN 19  CREATININE 2.40*  CALCIUM 8.7*    Management plans discussed with the patient, family and they are in agreement.  CODE STATUS:     Code Status Orders  (From admission, onward)  Start     Ordered   11/12/17 1637  Full code  Continuous     11/12/17 1636    Code Status History    Date Active Date Inactive Code Status Order ID Comments User Context   11/04/2017 1544 11/12/2017 1636 Partial Code [id number]  Morton Stall, NP Inpatient   10/30/2017 2110 11/04/2017 1544 Full Code [id number]  Lewie Loron, NP Inpatient   10/30/2017 1813 10/30/2017 2110 Full Code [id number]  Altamese Dilling, MD Inpatient      TOTAL TIME TAKING CARE OF THIS PATIENT: 38 minutes.    Alford Highland M.D on 11/27/2017 at 2:42 PM  Between 7am to 6pm - Pager - [phone]  After 6pm go to [email or web address] - Social research officer, government  [employer] Office  [phone]  CC: Primary care physician; [facility]

## 2017-12-01 ENCOUNTER — Telehealth (INDEPENDENT_AMBULATORY_CARE_PROVIDER_SITE_OTHER): Payer: Self-pay | Admitting: Vascular Surgery

## 2017-12-01 NOTE — Telephone Encounter (Signed)
Patient's sister Rosey Batheresa called concerned because the patient is asking for more pain medication than he was prescribed, she states he is complaining of unbearable pain. She did state he fell on his stump in the bathroom after she closed the door last week but the site looks fine just a little swelling and redness on top of his stump. She is concerned because of his past history of drug abuse and wants some advice. I let her know I would speak with Dr. Gilda CreaseSchnier and get back with her regarding this.

## 2017-12-03 ENCOUNTER — Telehealth (INDEPENDENT_AMBULATORY_CARE_PROVIDER_SITE_OTHER): Payer: Self-pay

## 2017-12-03 DIAGNOSIS — F1910 Other psychoactive substance abuse, uncomplicated: Secondary | ICD-10-CM | POA: Insufficient documentation

## 2017-12-03 DIAGNOSIS — G8929 Other chronic pain: Secondary | ICD-10-CM | POA: Insufficient documentation

## 2017-12-03 NOTE — Telephone Encounter (Signed)
I attempted to contact Joshua [name] the patient's sister to let her know Dr. Marijean HeathSchnier's recommendation of the patient going to pain management. The patient's sister Joshua [name] has a voicemail that is full so I was unable to make contact with her.

## 2017-12-04 DIAGNOSIS — Z87898 Personal history of other specified conditions: Secondary | ICD-10-CM | POA: Insufficient documentation

## 2017-12-04 DIAGNOSIS — F1120 Opioid dependence, uncomplicated: Secondary | ICD-10-CM | POA: Insufficient documentation

## 2017-12-04 NOTE — Telephone Encounter (Signed)
I called and spoke with the patient's sister Rosey Batheresa today and gave her Dr. Marijean HeathSchnier's recommendation regarding pain management and she stated that he went to his PCP yesterday and was referred to pain management. Rosey Batheresa stated they are waiting for pain management to call.

## 2017-12-07 ENCOUNTER — Ambulatory Visit (INDEPENDENT_AMBULATORY_CARE_PROVIDER_SITE_OTHER): Payer: MEDICAID | Admitting: Vascular Surgery

## 2017-12-08 ENCOUNTER — Ambulatory Visit (INDEPENDENT_AMBULATORY_CARE_PROVIDER_SITE_OTHER): Payer: Self-pay | Admitting: Vascular Surgery

## 2017-12-08 ENCOUNTER — Encounter (INDEPENDENT_AMBULATORY_CARE_PROVIDER_SITE_OTHER): Payer: Self-pay | Admitting: Vascular Surgery

## 2017-12-08 VITALS — BP 142/88 | HR 98 | Resp 17

## 2017-12-08 DIAGNOSIS — M545 Low back pain, unspecified: Secondary | ICD-10-CM | POA: Insufficient documentation

## 2017-12-08 DIAGNOSIS — N529 Male erectile dysfunction, unspecified: Secondary | ICD-10-CM | POA: Insufficient documentation

## 2017-12-08 DIAGNOSIS — G8929 Other chronic pain: Secondary | ICD-10-CM | POA: Insufficient documentation

## 2017-12-08 DIAGNOSIS — N179 Acute kidney failure, unspecified: Secondary | ICD-10-CM

## 2017-12-08 DIAGNOSIS — Z89611 Acquired absence of right leg above knee: Secondary | ICD-10-CM

## 2017-12-08 NOTE — Progress Notes (Signed)
Subjective:    Patient ID: Joshua [name], male    DOB: [birth year], 27 y.o.   MRN: [id number] Chief Complaint  Patient presents with  . New Patient (Initial Visit)    [facility] staple removal   Patient presents for his first postoperative follow-up.  The patient is status post a right knee disarticulation with amputation on November 03, 2017 for a right lower extremity ischemia / rhabdomyolysis.  The patient was taken back to the operating room on November 10, 2017 underwent an revision above-the-knee amputation.  The patient was admitted to [facility] for approximately one month status post cardiac arrest.  He is now residing with his sister and his brother-in-law.  The patient states that he has not done any illicit drugs since discharge home. The patient has been working with physical therapy.  The patient experiences some right stump pain.  The patient is concerned that he is unable to achieve an erection.  The patient is also experiencing low back pain, weakness to his back and lower extremity.  The patient's postoperative course has been relatively unremarkable.  The patient denies any issues with his incision.  The patient denies any fever, nausea vomiting.  The patient is currently being maintained by a right IJ PermCath for end-stage renal disease sustained after cardiac arrest.  Patient notes his PermCath is working well.  Review of Systems  Constitutional: Negative.   HENT: Negative.   Eyes: Negative.   Respiratory: Negative.   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   Endocrine: Negative.   Genitourinary:       End-stage renal disease  Musculoskeletal: Positive for back pain.  Skin: Negative.   Allergic/Immunologic: Negative.   Neurological: Negative.   Hematological: Negative.   Psychiatric/Behavioral: Negative.       Objective:   Physical Exam  Constitutional: He is oriented to person, place, and time. He appears well-developed and well-nourished. No distress.  HENT:  Head: Normocephalic and atraumatic.  Right Ear: External ear normal.  Left Ear: External ear normal.  Eyes: Pupils are equal, round, and reactive to light. Conjunctivae and EOM are normal.  Neck: Normal range of motion.  Cardiovascular: Normal rate, regular rhythm, normal heart sounds and intact distal pulses.  Pulses:      Radial pulses are 2+ on the right side, and 2+ on the left side.       Dorsalis pedis pulses are 2+ on the left side.       Posterior tibial pulses are 2+ on the left side.  Pulmonary/Chest: Effort normal and breath sounds normal.  Musculoskeletal: Normal range of motion. He exhibits no edema.  Neurological: He is alert and oriented to person, place, and time.  Skin: Skin is warm. He is not diaphoretic.  Right AKA stump: Staples removed without complication.  Incision is clean dry and intact.  Stump is healing well.  Psychiatric: He has a normal mood and affect. His behavior is normal. Judgment and thought content normal.  Vitals reviewed.  BP (!) 142/88 (BP Location: Right Arm)   Pulse 98   Resp 17   Past Medical History:  Diagnosis Date  . Asthma   . Bronchitis   . Hernia cerebri [facility])    Social History   Socioeconomic History  . Marital status: Married    Spouse name: Not on file  . Number of children: Not on  file  . Years of education: Not on file  . Highest education level: Not on file  Occupational History  . Not on file  Social Needs  . Financial resource strain: Not on file  . Food insecurity:    Worry: Not on file    Inability: Not on file  . Transportation needs:    Medical: Not on file    Non-medical: Not on file  Tobacco Use  . Smoking status: Current Every Day Smoker    Packs/day: 0.50    Types: Cigarettes  . Smokeless tobacco: Never Used  Substance and Sexual Activity  . Alcohol use: No  .  Drug use: No  . Sexual activity: Not on file  Lifestyle  . Physical activity:    Days per week: Not on file    Minutes per session: Not on file  . Stress: Not on file  Relationships  . Social connections:    Talks on phone: Not on file    Gets together: Not on file    Attends religious service: Not on file    Active member of club or organization: Not on file    Attends meetings of clubs or organizations: Not on file    Relationship status: Not on file  . Intimate partner violence:    Fear of current or ex partner: Not on file    Emotionally abused: Not on file    Physically abused: Not on file    Forced sexual activity: Not on file  Other Topics Concern  . Not on file  Social History Narrative  . Not on file   Past Surgical History:  Procedure Laterality Date  . AMPUTATION Right 11/03/2017   Procedure: KNEE DISARTICULATION;  Surgeon: Renford DillsSchnier, Gregory G, MD;  Location: [facility] ORS;  Service: Vascular;  Laterality: Right;  . AMPUTATION Right 11/10/2017   Procedure: AMPUTATION ABOVE KNEE;  Surgeon: Renford DillsSchnier, Gregory G, MD;  Location: [facility] ORS;  Service: Vascular;  Laterality: Right;  . ankle/foot surgery with metal plates    . DIALYSIS/PERMA CATHETER INSERTION N/A 11/16/2017   Procedure: DIALYSIS/PERMA CATHETER INSERTION;  Surgeon: Annice Needyew, Jason S, MD;  Location: [facility] INVASIVE CV LAB;  Service: Cardiovascular;  Laterality: N/A;   Family History  Problem Relation Age of Onset  . Varicose Veins Neg Hx    No Known Allergies     Assessment & Plan:  Patient presents for his first postoperative follow-up.  The patient is status post a right knee disarticulation with amputation on November 03, 2017 for a right lower extremity ischemia / rhabdomyolysis.  The patient was taken back to the operating room on November 10, 2017 underwent an revision above-the-knee amputation.  The patient was admitted to [facility] for approximately one month status post cardiac arrest.  He is now  residing with his sister and his brother-in-law.  The patient states that he has not done any illicit drugs since discharge home. The patient has been working with physical therapy.  The patient experiences some right stump pain.  The patient is concerned that he is unable to achieve an erection.  The patient is also experiencing low back pain, weakness to his back and lower extremity.  The patient's postoperative course has been relatively unremarkable.  The patient denies any issues with his incision.  The patient denies any fever, nausea vomiting.  The patient is currently being maintained by a right IJ PermCath for end-stage renal disease sustained after cardiac arrest.  Patient notes his PermCath is  working well.  1. Acute renal failure, unspecified acute renal failure type (HCC) - Stable Patient is currently being maintained by right IJ PermCath without issue  2. S/P AKA (above knee amputation), right (HCC) - Stable Staples removed and Steri-Strips applied Right AKA stump is healing well I will submit the patient's information to [employer] to start the prosthetic process Patient to follow-up in 1 month for a wound check  3. Impotence - Stable Patient is unable to achieve an erection I will order an aortoiliac for when the patient returns in 1 month to assess his arterial blood flow to the pelvis I will also refer the patient to urology  - VAS US AORTA/IVC/ILIACS; Future - Ambulatory referral to Urology  4. 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 acetaminophen (TYLENOL) 325 MG tablet Take 2 tablets (650 mg total) by mouth every 6 (six) hours as needed for mild pain or fever.    . ALPRAZolam (XANAX) 0.25 MG tablet One tablet twice a day as need for one day, then one tablet daily as needed for five days 7 tablet 0  . amLODipine (NORVASC) 5 MG tablet Take 1 tablet (5 mg total) by mouth at bedtime. 30 tablet 0  . feeding supplement, ENSURE ENLIVE, (ENSURE ENLIVE) LIQD Take 237 mLs by mouth 3 (three) times daily between meals. 90 Bottle 0  . ferrous sulfate 325 (65 FE) MG tablet Take 1 tablet (325 mg total) by mouth 2 (two) times daily with a meal. 60 tablet 0  . gabapentin (NEURONTIN) 300 MG capsule Take 1 capsule (300 mg total) by mouth 2 (two) times daily. 60 capsule 0  . Multiple Vitamin (MULTIVITAMIN WITH MINERALS) TABS tablet Take 1 tablet by mouth 3 (three) times a week. 12 tablet 0  . nicotine (NICODERM CQ - DOSED IN MG/24 HOURS) 21 mg/24hr patch Place 1 patch (21 mg total) onto the skin daily. 28 patch 0  . oxyCODONE (OXY IR/ROXICODONE) 5 MG immediate release tablet One tab po every six hours for two days, one tab po every eight hours for three days, one tablet twice a day for three days, one tablet daily for five days 28 tablet 0  . senna (SENOKOT) 8.6 MG TABS tablet Take 2 tablets (17.2 mg total) by mouth at bedtime. 120 each 0  . vitamin C (VITAMIN C) 250 MG tablet Take 1 tablet (250 mg total) by mouth 2 (two) times daily. 60 tablet 0   No current facility-administered medications on file prior to visit.    There are no Patient Instructions on file for this visit. No follow-ups on file.  Travaris Kosh A Ermalinda Joubert, PA-C

## 2017-12-09 ENCOUNTER — Telehealth (INDEPENDENT_AMBULATORY_CARE_PROVIDER_SITE_OTHER): Payer: Self-pay | Admitting: Vascular Surgery

## 2017-12-09 NOTE — Telephone Encounter (Signed)
Spoke with Raul DelKim Stegmayer PA and per Selena BattenKim the patient is to follow up with Dr. Wynelle LinkKolluru per the discharge instructions. The patient read his discharge instructions which was the same as I read and he will follow up with Dr. Wynelle LinkKolluru. I explained also that when Nephrology decides the cath is to be removed they will send over an order.

## 2017-12-09 NOTE — Telephone Encounter (Signed)
Calling to see if and when he can have his port removed. He is afraid that it will get infected. States something is poking him. He has not had dialysis in 2wks.

## 2017-12-10 ENCOUNTER — Encounter (INDEPENDENT_AMBULATORY_CARE_PROVIDER_SITE_OTHER): Payer: Self-pay

## 2017-12-10 ENCOUNTER — Other Ambulatory Visit (INDEPENDENT_AMBULATORY_CARE_PROVIDER_SITE_OTHER): Payer: Self-pay | Admitting: Vascular Surgery

## 2017-12-11 ENCOUNTER — Emergency Department (HOSPITAL_COMMUNITY)
Admission: EM | Admit: 2017-12-11 | Discharge: 2017-12-11 | Disposition: A | Discharge status: Home / Self Care | Payer: Self-pay | Attending: Emergency Medicine | Admitting: Emergency Medicine

## 2017-12-11 ENCOUNTER — Emergency Department (HOSPITAL_COMMUNITY): Payer: Self-pay

## 2017-12-11 ENCOUNTER — Encounter (HOSPITAL_COMMUNITY): Payer: Self-pay

## 2017-12-11 DIAGNOSIS — I10 Essential (primary) hypertension: Secondary | ICD-10-CM | POA: Insufficient documentation

## 2017-12-11 DIAGNOSIS — J45909 Unspecified asthma, uncomplicated: Secondary | ICD-10-CM | POA: Insufficient documentation

## 2017-12-11 DIAGNOSIS — R599 Enlarged lymph nodes, unspecified: Secondary | ICD-10-CM | POA: Insufficient documentation

## 2017-12-11 DIAGNOSIS — R591 Generalized enlarged lymph nodes: Secondary | ICD-10-CM

## 2017-12-11 DIAGNOSIS — F4323 Adjustment disorder with mixed anxiety and depressed mood: Secondary | ICD-10-CM | POA: Insufficient documentation

## 2017-12-11 DIAGNOSIS — F17210 Nicotine dependence, cigarettes, uncomplicated: Secondary | ICD-10-CM | POA: Insufficient documentation

## 2017-12-11 HISTORY — DX: Personal history of other diseases of the respiratory system: Z87.09

## 2017-12-11 HISTORY — DX: Cardiac arrest, cause unspecified: I46.9

## 2017-12-11 HISTORY — DX: Acute kidney failure, unspecified: N17.9

## 2017-12-11 HISTORY — DX: Other psychoactive substance abuse, uncomplicated: F19.10

## 2017-12-11 HISTORY — DX: Rhabdomyolysis: M62.82

## 2017-12-11 HISTORY — DX: Essential (primary) hypertension: I10

## 2017-12-11 HISTORY — DX: Poisoning by unspecified drugs, medicaments and biological substances, accidental (unintentional), initial encounter: T50.901A

## 2017-12-11 HISTORY — DX: Pneumonitis due to inhalation of food and vomit: J69.0

## 2017-12-11 HISTORY — DX: Cardiogenic shock: R57.0

## 2017-12-11 LAB — CBC WITH DIFFERENTIAL/PLATELET
Basophils Absolute: 0.1 10*3/uL (ref 0.0–0.1)
Basophils Relative: 1 %
Eosinophils Absolute: 0.2 10*3/uL (ref 0.0–0.7)
Eosinophils Relative: 2 %
HEMATOCRIT: 39.4 % (ref 39.0–52.0)
HEMOGLOBIN: 12.6 g/dL — AB (ref 13.0–17.0)
LYMPHS ABS: 2.1 10*3/uL (ref 0.7–4.0)
LYMPHS PCT: 21 %
MCH: 27.6 pg (ref 26.0–34.0)
MCHC: 32 g/dL (ref 30.0–36.0)
MCV: 86.4 fL (ref 78.0–100.0)
MONOS PCT: 7 %
Monocytes Absolute: 0.7 10*3/uL (ref 0.1–1.0)
NEUTROS ABS: 7 10*3/uL (ref 1.7–7.7)
NEUTROS PCT: 69 %
Platelets: 368 10*3/uL (ref 150–400)
RBC: 4.56 MIL/uL (ref 4.22–5.81)
RDW: 14.1 % (ref 11.5–15.5)
WBC: 10.1 10*3/uL (ref 4.0–10.5)

## 2017-12-11 LAB — URINALYSIS, ROUTINE W REFLEX MICROSCOPIC
BILIRUBIN URINE: NEGATIVE
Glucose, UA: NEGATIVE mg/dL
HGB URINE DIPSTICK: NEGATIVE
Ketones, ur: NEGATIVE mg/dL
Leukocytes, UA: NEGATIVE
Nitrite: NEGATIVE
PH: 6 (ref 5.0–8.0)
Protein, ur: NEGATIVE mg/dL
Specific Gravity, Urine: 1.009 (ref 1.005–1.030)

## 2017-12-11 LAB — COMPREHENSIVE METABOLIC PANEL
ALBUMIN: 4.5 g/dL (ref 3.5–5.0)
ALK PHOS: 67 U/L (ref 38–126)
ALT: 13 U/L — ABNORMAL LOW (ref 17–63)
ANION GAP: 11 (ref 5–15)
AST: 18 U/L (ref 15–41)
BILIRUBIN TOTAL: 0.7 mg/dL (ref 0.3–1.2)
BUN: 9 mg/dL (ref 6–20)
CALCIUM: 9.6 mg/dL (ref 8.9–10.3)
CO2: 25 mmol/L (ref 22–32)
Chloride: 102 mmol/L (ref 101–111)
Creatinine, Ser: 0.8 mg/dL (ref 0.61–1.24)
GLUCOSE: 90 mg/dL (ref 65–99)
POTASSIUM: 3.5 mmol/L (ref 3.5–5.1)
Sodium: 138 mmol/L (ref 135–145)
TOTAL PROTEIN: 8.6 g/dL — AB (ref 6.5–8.1)

## 2017-12-11 MED ORDER — IOPAMIDOL (ISOVUE-300) INJECTION 61%
100.0000 mL | Freq: Once | INTRAVENOUS | Status: AC | PRN
  Administered 2017-12-11: 75 mL via INTRAVENOUS

## 2017-12-11 MED ORDER — OXYCODONE-ACETAMINOPHEN 5-325 MG PO TABS: 1.0000 | ORAL_TABLET | Freq: Three times a day (TID) | ORAL | 0 refills | Status: DC | PRN

## 2017-12-11 MED ORDER — LACTATED RINGERS IV BOLUS
1000.0000 mL | Freq: Once | INTRAVENOUS | Status: AC
  Administered 2017-12-11: 1000 mL via INTRAVENOUS

## 2017-12-11 MED ORDER — IBUPROFEN 400 MG PO TABS: 400.0000 mg | ORAL_TABLET | Freq: Four times a day (QID) | ORAL | 0 refills | Status: DC | PRN

## 2017-12-11 MED ORDER — HYDROMORPHONE HCL 1 MG/ML IJ SOLN
1.0000 mg | Freq: Once | INTRAMUSCULAR | Status: AC
  Administered 2017-12-11: 1 mg via INTRAVENOUS
  Filled 2017-12-11: qty 1

## 2017-12-11 MED ORDER — HYDROMORPHONE HCL 1 MG/ML IJ SOLN
1.0000 mg | Freq: Once | INTRAMUSCULAR | Status: AC
Start: 2017-12-11 — End: 2017-12-11
  Administered 2017-12-11: 1 mg via INTRAVENOUS
  Filled 2017-12-11: qty 1

## 2017-12-11 NOTE — ED Notes (Signed)
Per Dr Judie PetitM, Rad has spoken with him  Pt awaiting reeval and dispo

## 2017-12-11 NOTE — ED Notes (Signed)
Pt reports that he had leg amputated after passing out on it and remaining there for over 12 hours He also reports his friend had given him a pain pill that the hospital later found was fentanyl  Pt states was in the ICU in [place] for 2 weeks for problems related to his OD

## 2017-12-11 NOTE — ED Notes (Signed)
In CT

## 2017-12-11 NOTE — ED Provider Notes (Signed)
Emergency Department Provider Note   I have reviewed the triage vital signs and the nursing notes.   HISTORY  Chief Complaint Groin Swelling   HPI Joshua [name] is a 27 y.o. male with multiple medical problems as documented below the presents to the emergency department today with right groin pain.  Patient states that for the last 4 days and progressively worsening pain over his right inguinal ligament.  This is gotten worse throughout the last 4 days.  He states it is worse and gets swollen and bulging when he has a bowel movement.  He also has dysuria and increased urinary frequency.  He states his back is numb but this is been that way for at least 3 weeks since he left the hospital and the doctors were unsure why this was the case.  No pain or difficulty with bowel movements other than the swelling in his groin.  Never had anything at this before.  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  Patient states that he is not had any erythema, fever, draining or worsening swelling to that right AKA.  No other associated or modifying symptoms.    Past Medical History:  Diagnosis Date  . Acute renal failure (ARF) (HCC)    History of   . Aspiration pneumonia (HCC)    Hx of  . Asthma   . Bronchitis   . Cardiac arrest (HCC)    hx of  . Cardiogenic shock (HCC)    hx of  . Drug overdose   . Hernia cerebri (HCC)   . History of acute respiratory failure   . Hypertension   . Polysubstance abuse (HCC)   . Rhabdomyolysis     Patient Active Problem List   Diagnosis Date Noted  . 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 Impotence 12/08/2017  . Chronic bilateral low back pain without sciatica 12/08/2017  . Adjustment disorder with mixed anxiety and depressed mood 11/13/2017  . At risk for abuse of opiates 11/13/2017  . Acute kidney failure, unspecified (HCC) 11/02/2017  . Rhabdomyolysis 11/02/2017  . Acute respiratory  failure (HCC) 11/02/2017  . Cardiogenic shock (HCC) 10/30/2017  . Cardiac arrest (HCC) 10/30/2017  . Drug abuse (HCC) 10/30/2017    Past Surgical History:  Procedure Laterality Date  . AMPUTATION Right 11/03/2017   Procedure: KNEE DISARTICULATION;  Surgeon: Renford DillsSchnier, Gregory G, MD;  Location: [facility] ORS;  Service: Vascular;  Laterality: Right;  . AMPUTATION Right 11/10/2017   Procedure: AMPUTATION ABOVE KNEE;  Surgeon: Renford DillsSchnier, Gregory G, MD;  Location: [facility] OR[facility];  Service: Vascular;  Laterality: Right;  . ankle/foot surgery with metal plates    . DIALYSIS/PERMA CATHETER INSERTION N/A 11/16/2017   Procedure: DIALYSIS/PERMA CATHETER INSERTION;  Surgeon: Annice Needyew, Brandy Kabat S, MD;  Location: [facility];  Service: Cardiovascular;  Laterality: N/A;  . permacath to right side     Scheduled to be removed    Current Outpatient Rx  . Order #: [id number] Class: Print  . Order #: [id number] Class: Print  . Order #: [id number] Class: Print    Allergies Patient has no known allergies.  Family History  Problem Relation Age of Onset  . Varicose Veins Neg Hx     Social History Social History   Tobacco Use  . Smoking status: Current Every Day Smoker    Packs/day: 0.50    Types: Cigarettes  . Smokeless tobacco: Never Used  Substance Use  Topics  . Alcohol use: No  . Drug use: No    Review of Systems  All other systems negative except as documented in the HPI. All pertinent positives and negatives as reviewed in the HPI. ____________________________________________   PHYSICAL EXAM:  VITAL SIGNS: ED Triage Vitals  Enc Vitals Group     BP 12/11/17 1149 (!) 144/95     Pulse Rate 12/11/17 1149 82     Resp 12/11/17 1149 17     Temp 12/11/17 1149 98.1 F (36.7 C)     Temp Source 12/11/17 1149 Oral     SpO2 12/11/17 1149 100 %     Weight 12/11/17 1151 130 lb (59 kg)     Height --      Head Circumference --      Peak Flow --      Pain Score 12/11/17 1150 10     Pain Loc --      Pain  Edu? --      Excl. in GC? --     Constitutional: Alert and oriented. Well appearing and in no acute distress. Eyes: Conjunctivae are normal. PERRL. EOMI. Head: Atraumatic. Nose: No congestion/rhinnorhea. Mouth/Throat: Mucous membranes are moist.  Oropharynx non-erythematous. Neck: No stridor.  No meningeal signs.   Cardiovascular: Normal rate, regular rhythm. Good peripheral circulation. Grossly normal heart sounds.   Respiratory: Normal respiratory effort.  No retractions. Lungs CTAB. Gastrointestinal: Soft and nontender. No distention.  Musculoskeletal: No lower extremity tenderness nor edema. No gross deformities of extremities. Tender lymphadenopathy to right inguinal area.  Neurologic:  Normal speech and language. No gross focal neurologic deficits are appreciated.  Skin:  Skin is warm, dry and intact. No rash noted.  ____________________________________________   LABS (all labs ordered are listed, but only abnormal results are displayed)  Labs Reviewed  CBC WITH DIFFERENTIAL/PLATELET - Abnormal; Notable for the following components:      Result Value   Hemoglobin 12.6 (*)    All other components within normal limits  COMPREHENSIVE METABOLIC PANEL - Abnormal; Notable for the following components:   Total Protein 8.6 (*)    ALT 13 (*)    All other components within normal limits  URINALYSIS, ROUTINE W REFLEX MICROSCOPIC   ____________________________________________  RADIOLOGY  Ct Abdomen Pelvis W Contrast  Addendum Date: 12/11/2017   ADDENDUM REPORT: 12/11/2017 17:05 ADDENDUM: Since the time of original interpretation, it is become evident that the patient has another account number, and under this account the patient has undergone lumbar MRI on November 12, 2017 at [facility]. This MRI demonstrates prominent edema within the bilateral paraspinous muscles as well as intramuscular hemorrhage within the left paraspinous muscles, which would account for the  finding seen on CT scan. Therefore, lumbar MRI is no longer required. Also, given the history of recent right above knee amputation, the findings identified in the proximal right thigh most likely represent expected postoperative changes. Therefore, MRI of the right thigh is no longer required either. Electronically Signed   By: Lupita Raider, M.D.   On: 12/11/2017 17:05   Result Date: 12/11/2017 CLINICAL DATA:  Acute right inguinal pain and swelling. EXAM: CT ABDOMEN AND PELVIS WITH CONTRAST TECHNIQUE: Multidetector CT imaging of the abdomen and pelvis was performed using the standard protocol following bolus administration of intravenous contrast. CONTRAST:  75mL ISOVUE-300 IOPAMIDOL (ISOVUE-300) INJECTION 61% COMPARISON:  None. FINDINGS: Lower chest: No acute abnormality. Hepatobiliary: No focal liver abnormality is seen. No gallstones, gallbladder wall thickening, or  biliary dilatation. Pancreas: Unremarkable. No pancreatic ductal dilatation or surrounding inflammatory changes. Spleen: Normal in size without focal abnormality. Adrenals/Urinary Tract: Adrenal glands are unremarkable. Kidneys are normal, without renal calculi, focal lesion, or hydronephrosis. Bladder is unremarkable. Stomach/Bowel: Stomach is within normal limits. Appendix appears normal. No evidence of bowel wall thickening, distention, or inflammatory changes. Vascular/Lymphatic: No significant vascular findings are present. Right inguinal adenopathy is noted with the largest lymph node measuring 1.4 cm medial to right common femoral vein. Reproductive: Prostate is unremarkable. Other: No hernia is noted. Inflammatory stranding or edema as well as enlarged right lateral thigh muscle is noted suggesting inflammation. There is also noted enlargement of the left perispinal musculature with low density suggesting inflammation and possible abscess. Musculoskeletal: No acute or significant osseous findings. IMPRESSION: Visualized portion of right  lateral thigh muscles proximally appear to be enlarged and edematous with surrounding inflammation or edema. Further evaluation with MRI of the right thigh is recommended. Mild right inguinal adenopathy is noted most likely inflammatory in etiology. Also noted is enlargement and ill-defined low densities within the left perispinal musculature of the lumbar spine suggesting inflammation and possible abscesses. Further evaluation with CT or MRI with contrast administration of the lumbar region is recommended. Electronically Signed: By: Lupita Raider, M.D. On: 12/11/2017 15:44    ____________________________________________   INITIAL IMPRESSION / ASSESSMENT AND PLAN / ED COURSE  Lymphadenopathy likely reactive to recent operation. Other findings chronic. No e/o infection, will continue to watch at home and return for worsening pain or s/s of infection.   Pertinent labs & imaging results that were available during my care of the patient were reviewed by me and considered in my medical decision making (see chart for details).  ____________________________________________  FINAL CLINICAL IMPRESSION(S) / ED DIAGNOSES  Final diagnoses:  Lymphadenopathy     MEDICATIONS GIVEN DURING THIS VISIT:  Medications  HYDROmorphone (DILAUDID) injection 1 mg (1 mg Intravenous Given 12/11/17 1327)  lactated ringers bolus 1,000 mL (0 mLs Intravenous Stopped 12/11/17 1627)  iopamidol (ISOVUE-300) 61 % injection 100 mL (75 mLs Intravenous Contrast Given 12/11/17 1510)  HYDROmorphone (DILAUDID) injection 1 mg (1 mg Intravenous Given 12/11/17 1704)     NEW OUTPATIENT MEDICATIONS STARTED DURING THIS VISIT:  Discharge Medication List as of 12/11/2017  6:33 PM    START taking these medications   Details  ibuprofen (ADVIL,MOTRIN) 400 MG tablet Take 1 tablet (400 mg total) by mouth every 6 (six) hours as needed., Starting Fri 12/11/2017, Print        Note:  This note was prepared with assistance of Dragon  voice recognition software. Occasional wrong-word or sound-a-like substitutions may have occurred due to the inherent limitations of voice recognition software.   Marily Memos, MD 12/11/17 [phone]

## 2017-12-11 NOTE — ED Notes (Signed)
Pt has been awaiting M<RI x 1 hour Call to dept thru CT Message that tech asking Radiologist to Call EDP re: need to do it

## 2017-12-11 NOTE — ED Triage Notes (Signed)
Pt reports right sided inguinal swelling for 4 days. Difficulty urinating and when he attempts to have BM swells more. [facility]

## 2017-12-14 ENCOUNTER — Encounter
Admission: RE | Disposition: A | Discharge status: Home / Self Care | Payer: Self-pay | Source: Ambulatory Visit | Attending: Vascular Surgery

## 2017-12-14 ENCOUNTER — Telehealth (INDEPENDENT_AMBULATORY_CARE_PROVIDER_SITE_OTHER): Payer: Self-pay | Admitting: Vascular Surgery

## 2017-12-14 ENCOUNTER — Ambulatory Visit
Admission: RE | Admit: 2017-12-14 | Discharge: 2017-12-14 | Disposition: A | Discharge status: Home / Self Care | Payer: Self-pay | Source: Ambulatory Visit | Attending: Vascular Surgery | Admitting: Vascular Surgery

## 2017-12-14 ENCOUNTER — Encounter: Payer: Self-pay | Admitting: Emergency Medicine

## 2017-12-14 DIAGNOSIS — Z87448 Personal history of other diseases of urinary system: Secondary | ICD-10-CM | POA: Insufficient documentation

## 2017-12-14 DIAGNOSIS — N186 End stage renal disease: Secondary | ICD-10-CM

## 2017-12-14 DIAGNOSIS — F4323 Adjustment disorder with mixed anxiety and depressed mood: Secondary | ICD-10-CM | POA: Insufficient documentation

## 2017-12-14 DIAGNOSIS — Z89611 Acquired absence of right leg above knee: Secondary | ICD-10-CM | POA: Insufficient documentation

## 2017-12-14 DIAGNOSIS — J90 Pleural effusion, not elsewhere classified: Secondary | ICD-10-CM | POA: Insufficient documentation

## 2017-12-14 DIAGNOSIS — Z8674 Personal history of sudden cardiac arrest: Secondary | ICD-10-CM | POA: Insufficient documentation

## 2017-12-14 DIAGNOSIS — F17210 Nicotine dependence, cigarettes, uncomplicated: Secondary | ICD-10-CM | POA: Insufficient documentation

## 2017-12-14 DIAGNOSIS — G8929 Other chronic pain: Secondary | ICD-10-CM | POA: Insufficient documentation

## 2017-12-14 DIAGNOSIS — M50222 Other cervical disc displacement at C5-C6 level: Secondary | ICD-10-CM | POA: Insufficient documentation

## 2017-12-14 DIAGNOSIS — M6282 Rhabdomyolysis: Secondary | ICD-10-CM | POA: Insufficient documentation

## 2017-12-14 DIAGNOSIS — Z4901 Encounter for fitting and adjustment of extracorporeal dialysis catheter: Secondary | ICD-10-CM | POA: Insufficient documentation

## 2017-12-14 DIAGNOSIS — M545 Low back pain: Secondary | ICD-10-CM | POA: Insufficient documentation

## 2017-12-14 DIAGNOSIS — J45909 Unspecified asthma, uncomplicated: Secondary | ICD-10-CM | POA: Insufficient documentation

## 2017-12-14 DIAGNOSIS — N189 Chronic kidney disease, unspecified: Secondary | ICD-10-CM

## 2017-12-14 DIAGNOSIS — F1911 Other psychoactive substance abuse, in remission: Secondary | ICD-10-CM | POA: Insufficient documentation

## 2017-12-14 HISTORY — PX: DIALYSIS/PERMA CATHETER REMOVAL: CATH118289

## 2017-12-14 SURGERY — DIALYSIS/PERMA CATHETER REMOVAL
Anesthesia: LOCAL

## 2017-12-14 MED ORDER — LIDOCAINE-EPINEPHRINE (PF) 1 %-1:200000 IJ SOLN
INTRAMUSCULAR | Status: DC | PRN
  Administered 2017-12-14: 20 mL via INTRADERMAL

## 2017-12-14 SURGICAL SUPPLY — 2 items
FORCEPS HALSTEAD CVD 5IN STRL (INSTRUMENTS) ×2 IMPLANT
TRAY LACERAT/PLASTIC (MISCELLANEOUS) ×2 IMPLANT

## 2017-12-14 NOTE — Progress Notes (Signed)
Patient expressed desire to get prosthetic, spoke with Dr Wyn Quakerew and called [facility] as directed to request referral. Left message with Vernona RiegerLaura for referral. Patient also expressed frustration and lack of knowledge in applying for medicaid as well as the need for additional resources based on his current situation. Spoke with care manager Misty StanleyLisa who gave information on [facility], forwarded information to patient.

## 2017-12-14 NOTE — Op Note (Signed)
Operative Note     Preoperative diagnosis:   1. Renal failure with return of function no longer requiring dialysis  Postoperative diagnosis:  1.  Same as above  Procedure:  Removal of right jugular Permcath  Surgeon:  Festus BarrenJason Tracy Kinner, MD  Anesthesia:  Local  EBL:  Minimal  Indication for the Procedure:  The patient has return of his renal function and no longer needs their permcath.  This can be removed.  Risks and benefits are discussed and informed consent is obtained.  Description of the Procedure:  The patient's right neck, chest and existing catheter were sterilely prepped and draped. The area around the catheter was anesthetized copiously with 1% lidocaine. The catheter was dissected out with curved hemostats until the cuff was freed from the surrounding fibrous sheath. The fiber sheath was transected, and the catheter was then removed in its entirety using gentle traction. Pressure was held and sterile dressings were placed. The patient tolerated the procedure well and was taken to the recovery room in stable condition.     Festus BarrenJason Kahley Leib  12/14/2017, 9:29 AM This note was created with Dragon Medical transcription system. Any errors in dictation are purely unintentional.

## 2017-12-14 NOTE — Discharge Instructions (Signed)
Incision Care, Adult An incision is a surgical cut that is made through your skin. Most incisions are closed after surgery. Your incision may be closed with stitches (sutures), staples, skin glue, or adhesive strips. You may need to return to your health care provider to have sutures or staples removed. This may occur several days to several weeks after your surgery. The incision needs to be cared for properly to prevent infection. How to care for your incision Incision care   Follow instructions from your health care provider about how to take care of your incision. Make sure you: ? Wash your hands with soap and water before you change the bandage (dressing). If soap and water are not available, use hand sanitizer. ? Change your dressing as told by your health care provider. ? Leave sutures, skin glue, or adhesive strips in place. These skin closures may need to stay in place for 2 weeks or longer. If adhesive strip edges start to loosen and curl up, you may trim the loose edges. Do not remove adhesive strips completely unless your health care provider tells you to do that.  Check your incision area every day for signs of infection. Check for: ? More redness, swelling, or pain. ? More fluid or blood. ? Warmth. ? Pus or a bad smell.  Ask your health care provider how to clean the incision. This may include: ? Using mild soap and water. ? Using a clean towel to pat the incision dry after cleaning it. ? Applying a cream or ointment. Do this only as told by your health care provider. ? Covering the incision with a clean dressing.  Ask your health care provider when you can leave the incision uncovered.  Do not take baths, swim, or use a hot tub until your health care provider approves. Ask your health care provider if you can take showers. You may only be allowed to take sponge baths for bathing. Medicines  If you were prescribed an antibiotic medicine, cream, or ointment, take or apply the  antibiotic as told by your health care provider. Do not stop taking or applying the antibiotic even if your condition improves.  Take over-the-counter and prescription medicines only as told by your health care provider. General instructions  Limit movement around your incision to improve healing. ? Avoid straining, lifting, or exercise for the first month, or for as long as told by your health care provider. ? Follow instructions from your health care provider about returning to your normal activities. ? Ask your health care provider what activities are safe.  Protect your incision from the sun when you are outside for the first 6 months, or for as long as told by your health care provider. Apply sunscreen around the scar or cover it up.  Keep all follow-up visits as told by your health care provider. This is important. Contact a health care provider if:  Your have more redness, swelling, or pain around the incision.  You have more fluid or blood coming from the incision.  Your incision feels warm to the touch.  You have pus or a bad smell coming from the incision.  You have a fever or shaking chills.  You are nauseous or you vomit.  You are dizzy.  Your sutures or staples come undone. Get help right away if:  You have a red streak coming from your incision.  Your incision bleeds through the dressing and the bleeding does not stop with gentle pressure.  The edges of   your incision open up and separate.  You have severe pain.  You have a rash.  You are confused.  You faint.  You have trouble breathing and a fast heartbeat. This information is not intended to replace advice given to you by your health care provider. Make sure you discuss any questions you have with your health care provider. Document Released: 02/28/2005 Document Revised: 04/18/2016 Document Reviewed: 02/27/2016 Elsevier Interactive Patient Education  2018 [employer]  

## 2017-12-14 NOTE — Telephone Encounter (Signed)
I gave the patient [employer] number [phone] for him to check on the status

## 2017-12-14 NOTE — H&P (Signed)
[facility] Admission History & Physical  MRN : [id number]  Joshua [name] is a 27 y.o. (1991-06-04) male who presents with chief complaint of No chief complaint on file. Marland Kitchen  History of Present Illness: I am asked to evaluate the patient by the dialysis center. The patient was sent here because they have had return of the renal function and no longer need their dialysis catheter.  His last creatinine was 0.8.  He was admitted with renal failure and had extended renal replacement but no longer requires this    No current facility-administered medications for this encounter.     Past Medical History:  Diagnosis Date  . Acute renal failure (ARF) (HCC)    History of   . Aspiration pneumonia (HCC)    Hx of  . Asthma   . Bronchitis   . Cardiac arrest (HCC)    hx of  . Cardiogenic shock (HCC)    hx of  . Drug overdose   . Hernia cerebri (HCC)   . History of acute respiratory failure   . Hypertension   . Polysubstance abuse (HCC)   . Rhabdomyolysis     Past Surgical History:  Procedure Laterality Date  . AMPUTATION Right 11/03/2017   Procedure: KNEE DISARTICULATION;  Surgeon: Renford Dills, MD;  Location: [facility] ORS;  Service: Vascular;  Laterality: Right;  . AMPUTATION Right 11/10/2017   Procedure: AMPUTATION ABOVE KNEE;  Surgeon: Renford Dills, MD;  Location: [facility] ORS;  Service: Vascular;  Laterality: Right;  . ankle/foot surgery with metal plates    . DIALYSIS/PERMA CATHETER INSERTION N/A 11/16/2017   Procedure: DIALYSIS/PERMA CATHETER INSERTION;  Surgeon: Annice Needy, MD;  Location: [facility] INVASIVE CV LAB;  Service: Cardiovascular;  Laterality: N/A;  . permacath to right side     Scheduled to be removed    Social History Social History   Tobacco Use  . Smoking status: Current Every Day Smoker    Packs/day: 0.50    Types: Cigarettes  . Smokeless tobacco: Never Used  Substance Use Topics  . Alcohol use: No  . Drug use: No    Family  History Family History  Problem Relation Age of Onset  . Varicose Veins Neg Hx     No family history of bleeding or clotting disorders, autoimmune disease or porphyria  No Known Allergies   REVIEW OF SYSTEMS (Negative unless checked)  Constitutional: [] Weight loss  [] Fever  [] Chills Cardiac: [] Chest pain   [] Chest pressure   [] Palpitations   [] Shortness of breath when laying flat   [] Shortness of breath at rest   [x] Shortness of breath with exertion. Vascular:  [] Pain in legs with walking   [] Pain in legs at rest   [] Pain in legs when laying flat   [] Claudication   [] Pain in feet when walking  [] Pain in feet at rest  [] Pain in feet when laying flat   [] History of DVT   [] Phlebitis   [] Swelling in legs   [] Varicose veins   [] Non-healing ulcers Pulmonary:   [] Uses home oxygen   [] Productive cough   [] Hemoptysis   [] Wheeze  [] COPD   [] Asthma Neurologic:  [] Dizziness  [] Blackouts   [] Seizures   [] History of stroke   [] History of TIA  [] Aphasia   [] Temporary blindness   [] Dysphagia   [] Weakness or numbness in arms   [] Weakness or numbness in legs Musculoskeletal:  [x] Arthritis   [] Joint swelling   [x] Joint pain   [x] Low back pain Hematologic:  [] Easy  bruising  [] Easy bleeding   [] Hypercoagulable state   [] Anemic  [] Hepatitis Gastrointestinal:  [] Blood in stool   [] Vomiting blood  [] Gastroesophageal reflux/heartburn   [] Difficulty swallowing. Genitourinary:  [x] Chronic kidney disease   [] Difficult urination  [] Frequent urination  [] Burning with urination   [] Blood in urine Skin:  [] Rashes   [] Ulcers   [] Wounds Psychological:  [] History of anxiety   []  History of major depression.  Physical Examination  There were no vitals filed for this visit. There is no height or weight on file to calculate BMI. Gen: WD/WN, NAD Head: [place]/AT, No temporalis wasting.  Ear/Nose/Throat: Hearing grossly intact, nares w/o erythema or drainage, oropharynx w/o Erythema/Exudate,  Eyes: Conjunctiva clear, sclera  non-icteric Neck: Trachea midline.  No JVD.  Pulmonary:  Good air movement, respirations not labored, no use of accessory muscles.  Cardiac: RRR, normal S1, S2. Vascular: right jugular permcath in place Vessel Right Left  Radial Palpable Palpable               Musculoskeletal: M/S 5/5 throughout.  Right AKA Neurologic: Sensation grossly intact in extremities.  Symmetrical.  Speech is fluent. Motor exam as listed above. Psychiatric: Judgment intact, Mood & affect appropriate for pt's clinical situation. Dermatologic: No rashes or ulcers noted.  No cellulitis or open wounds.    CBC Lab Results  Component Value Date   WBC 10.1 12/11/2017   HGB 12.6 (L) 12/11/2017   HCT 39.4 12/11/2017   MCV 86.4 12/11/2017   PLT 368 12/11/2017    BMET    Component Value Date/Time   NA 138 12/11/2017 1253   NA 141 05/08/2014 2307   K 3.5 12/11/2017 1253   K 4.0 05/08/2014 2307   CL 102 12/11/2017 1253   CL 103 05/08/2014 2307   CO2 25 12/11/2017 1253   CO2 31 05/08/2014 2307   GLUCOSE 90 12/11/2017 1253   GLUCOSE 95 05/08/2014 2307   BUN 9 12/11/2017 1253   BUN 19 (H) 05/08/2014 2307   CREATININE 0.80 12/11/2017 1253   CREATININE 1.02 05/08/2014 2307   CALCIUM 9.6 12/11/2017 1253   CALCIUM 9.6 05/08/2014 2307   GFRNONAA >60 12/11/2017 1253   GFRNONAA >60 05/08/2014 2307   GFRAA >60 12/11/2017 1253   GFRAA >60 05/08/2014 2307   Estimated Creatinine Clearance: 114.7 mL/min (by C-G formula based on SCr of 0.8 mg/dL).  COAG Lab Results  Component Value Date   INR 1.29 11/11/2017   INR 1.27 11/10/2017   INR 1.12 11/04/2017    Radiology Dg Chest 2 View  Result Date: 11/19/2017 CLINICAL DATA:  Shortness of breath EXAM: CHEST - 2 VIEW COMPARISON:  11/06/2017 FINDINGS: Removal of endotracheal and esophageal tubes. Right-sided central venous catheter tip overlies the SVC. Improved aeration of the lung bases. Borderline cardiomegaly. Mild vascular congestion. Focal airspace disease  in the right upper and lower lobes. Small bilateral pleural effusion. IMPRESSION: 1. Removal of endotracheal and esophageal tubes. 2. Improved aeration at the bases. 3. Small bilateral pleural effusions. Mild perihilar interstitial opacity suspect for edema. More confluent airspace disease in the right upper and lower lobes of the lung may reflect superimposed pneumonia. Electronically Signed   By: Jasmine Pang M.D.   On: 11/19/2017 23:37   Mr Cervical Spine Wo Contrast  Result Date: 11/16/2017 CLINICAL DATA:  27 y/o M; impaired pinprick and light touch below the neck worse on the left. EXAM: MRI CERVICAL SPINE WITHOUT CONTRAST TECHNIQUE: Multiplanar, multisequence MR imaging of the cervical spine was performed. No  intravenous contrast was administered. COMPARISON:  03/15/2017 neck CT. FINDINGS: Alignment: Physiologic. Vertebrae: No fracture, evidence of discitis, or bone lesion. Cord: Normal signal and morphology. Posterior Fossa, vertebral arteries, paraspinal tissues: Negative. Disc levels: C2-3: No significant disc displacement, foraminal stenosis, or canal stenosis. C3-4: No significant disc displacement, foraminal stenosis, or canal stenosis. C4-5: No significant disc displacement, foraminal stenosis, or canal stenosis. C5-6: Mild discogenic degenerative changes with loss of disc space height and a small disc bulge effacing the ventral thecal sac with small central annular fissure. No significant foraminal or canal stenosis. C6-7: No significant disc displacement, foraminal stenosis, or canal stenosis. C7-T1: No significant disc displacement, foraminal stenosis, or canal stenosis. IMPRESSION: 1. No acute osseous or cord signal abnormality. 2. Mild C5-6 discogenic degenerative changes with a small disc bulge and central annular fissure effacing ventral thecal sac. No cord compression, foraminal stenosis, or canal stenosis. Electronically Signed   By: Mitzi HansenLance  Furusawa-Stratton M.D.   On: 11/16/2017 22:49    Ct Abdomen Pelvis W Contrast  Addendum Date: 12/11/2017   ADDENDUM REPORT: 12/11/2017 17:05 ADDENDUM: Since the time of original interpretation, it is become evident that the patient has another account number, and under this account the patient has undergone lumbar MRI [facility] 12, 2017 [facility]. This MRI demonstrates prominent edema within the bilateral paraspinous muscles as well as intramuscular hemorrhage within the left paraspinous muscles, which would account for the finding seen on CT scan. Therefore, lumbar MRI is no longer required. Also, given the history of recent right above knee amputation, the findings identified in the proximal right thigh most likely represent expected postoperative changes. Therefore, MRI of the right thigh is no longer required either. Electronically Signed   By: Lupita RaiderJames  Green Jr, M.D.   On: 12/11/2017 17:05   Result Date: 12/11/2017 CLINICAL DATA:  Acute right inguinal pain and swelling. EXAM: CT ABDOMEN AND PELVIS WITH CONTRAST TECHNIQUE: Multidetector CT imaging of the abdomen and pelvis was performed using the standard protocol following bolus administration of intravenous contrast. CONTRAST:  75mL ISOVUE-300 IOPAMIDOL (ISOVUE-300) INJECTION 61% COMPARISON:  None. FINDINGS: Lower chest: No acute abnormality. Hepatobiliary: No focal liver abnormality is seen. No gallstones, gallbladder wall thickening, or biliary dilatation. Pancreas: Unremarkable. No pancreatic ductal dilatation or surrounding inflammatory changes. Spleen: Normal in size without focal abnormality. Adrenals/Urinary Tract: Adrenal glands are unremarkable. Kidneys are normal, without renal calculi, focal lesion, or hydronephrosis. Bladder is unremarkable. Stomach/Bowel: Stomach is within normal limits. Appendix appears normal. No evidence of bowel wall thickening, distention, or inflammatory changes. Vascular/Lymphatic: No significant vascular findings are present. Right inguinal  adenopathy is noted with the largest lymph node measuring 1.4 cm medial to right common femoral vein. Reproductive: Prostate is unremarkable. Other: No hernia is noted. Inflammatory stranding or edema as well as enlarged right lateral thigh muscle is noted suggesting inflammation. There is also noted enlargement of the left perispinal musculature with low density suggesting inflammation and possible abscess. Musculoskeletal: No acute or significant osseous findings. IMPRESSION: Visualized portion of right lateral thigh muscles proximally appear to be enlarged and edematous with surrounding inflammation or edema. Further evaluation with MRI of the right thigh is recommended. Mild right inguinal adenopathy is noted most likely inflammatory in etiology. Also noted is enlargement and ill-defined low densities within the left perispinal musculature of the lumbar spine suggesting inflammation and possible abscesses. Further evaluation with CT or MRI with contrast administration of the lumbar region is recommended. Electronically Signed: By: Lupita RaiderJames  Green Jr,  M.D. On: 12/11/2017 15:44    Assessment/Plan 1.  Renal failure: Appears to be resolved at this point and no longer needs his dialysis catheter.  This can be removed. 2.  Polysubstance abuse: Status post cardiac arrest with prolonged downtime and multiple complications thereof.  These include loss of limb and renal failure which has now improved. 3.  S/P right AKA: well healed.  No evidence of wound issues and staples recently removed   Festus Barren, MD  12/14/2017 8:40 AM

## 2017-12-16 ENCOUNTER — Emergency Department
Admission: EM | Admit: 2017-12-16 | Discharge: 2017-12-16 | Disposition: A | Discharge status: Home / Self Care | Payer: Self-pay | Attending: Emergency Medicine | Admitting: Emergency Medicine

## 2017-12-16 ENCOUNTER — Emergency Department: Payer: Self-pay

## 2017-12-16 ENCOUNTER — Encounter: Payer: Self-pay | Admitting: Emergency Medicine

## 2017-12-16 ENCOUNTER — Other Ambulatory Visit: Payer: Self-pay

## 2017-12-16 DIAGNOSIS — J45909 Unspecified asthma, uncomplicated: Secondary | ICD-10-CM | POA: Insufficient documentation

## 2017-12-16 DIAGNOSIS — I10 Essential (primary) hypertension: Secondary | ICD-10-CM | POA: Insufficient documentation

## 2017-12-16 DIAGNOSIS — G894 Chronic pain syndrome: Secondary | ICD-10-CM

## 2017-12-16 DIAGNOSIS — F17210 Nicotine dependence, cigarettes, uncomplicated: Secondary | ICD-10-CM | POA: Insufficient documentation

## 2017-12-16 DIAGNOSIS — M79604 Pain in right leg: Secondary | ICD-10-CM

## 2017-12-16 MED ORDER — GABAPENTIN 100 MG PO CAPS: 200.0000 mg | ORAL_CAPSULE | Freq: Three times a day (TID) | ORAL | 0 refills | Status: DC

## 2017-12-16 MED ORDER — CEPHALEXIN 500 MG PO CAPS: 500.0000 mg | ORAL_CAPSULE | Freq: Three times a day (TID) | ORAL | 0 refills | Status: DC

## 2017-12-16 MED ORDER — SULFAMETHOXAZOLE-TRIMETHOPRIM 800-160 MG PO TABS
1.0000 | ORAL_TABLET | Freq: Two times a day (BID) | ORAL | 0 refills | Status: DC
Start: 2017-12-16 — End: 2018-09-20

## 2017-12-16 MED ORDER — KETOROLAC TROMETHAMINE 60 MG/2ML IM SOLN
15.0000 mg | Freq: Once | INTRAMUSCULAR | Status: AC
  Administered 2017-12-16: 15 mg via INTRAMUSCULAR
  Filled 2017-12-16: qty 2

## 2017-12-16 MED ORDER — GABAPENTIN 100 MG PO CAPS
200.0000 mg | ORAL_CAPSULE | Freq: Once | ORAL | Status: AC
  Administered 2017-12-16: 200 mg via ORAL
  Filled 2017-12-16: qty 2

## 2017-12-16 MED ORDER — IBUPROFEN 200 MG PO TABS: 600.0000 mg | ORAL_TABLET | Freq: Four times a day (QID) | ORAL | 0 refills | Status: DC | PRN

## 2017-12-16 NOTE — ED Notes (Signed)
Pt calling out again wanting MD to bring him pain medications. MD in room with another pt at this time. Will notify MD.

## 2017-12-16 NOTE — ED Notes (Signed)
Pt brought in by ems for post surgical pain. Pt has right AKA a month ago and has run out of pain meds.

## 2017-12-16 NOTE — ED Triage Notes (Signed)
Pt to triage via w/c with no distress noted; Pt reports right AKA on 3/12; c/o postop pain; st he was told by his surgeon to go  pain clinic but has not made appt yet; st "If I have to come to the hospital every day until I get seen I will"

## 2017-12-16 NOTE — Discharge Instructions (Addendum)
as we discussed, you should find strategies for managing your chronic pain that does not involve opioid pain medication. With your previous drug abuse and overdose that resulted in near death and cardiac arrest, you are at high risk for accidental overdose and death in the future if you resume taking these medications.  your imaging today suggest that your developing a soft tissue infection in the leg. Take antibiotics as prescribed and follow-up with your vascular surgeon for further evaluation.

## 2017-12-16 NOTE — ED Notes (Signed)
Pt returned from US, called out stating "you need to tell that doctor I need stronger pain medication." Explained that he will not be able to have stronger pain medications. " I am in the hospital you can monitor me, tell him I need stronger pain medication, or have him come in here and I will tell him." Dr. Scotty CourtStafford aware of conversation.

## 2017-12-16 NOTE — ED Notes (Signed)
Patient transported to Ultrasound 

## 2017-12-16 NOTE — ED Provider Notes (Signed)
[facility] Emergency Department Provider Note  ____________________________________________  Time seen: Approximately 9:33 AM  I have reviewed the triage vital signs and the nursing notes.   HISTORY  Chief Complaint Leg Pain    HPI Joshua [name] is a 27 y.o. male with a history of polysubstance abuse that resulted in a near fatal overdose 6 weeks ago that resulted in prolonged ACLS after cardiac arrest, intubation and ICU stay, acute renal failure requiring dialysis. I took care of this patient at that time.  He complains of right leg pain that has been present since he woke up from surgery after having an AKA.   He was prescribed opioids on discharge from the hospital which she took to manage the pain, but now he is run out of them and requests to have more. He denies any acute changes or new injuries. No chest pain shortness of breath fevers or chills. The pain is constant severe nonradiating and no aggravating or alleviating factors.      Past Medical History:  Diagnosis Date  . Acute renal failure (ARF) (HCC)    History of   . Aspiration pneumonia (HCC)    Hx of  . Asthma   . Bronchitis   . Cardiac arrest (HCC)    hx of  . Cardiogenic shock (HCC)    hx of  . Drug overdose   . Hernia cerebri (HCC)   . History of acute respiratory failure   . Hypertension   . Polysubstance abuse (HCC)   . Rhabdomyolysis      Patient Active Problem List   Diagnosis Date Noted  . 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 Impotence 12/08/2017  . Chronic bilateral low back pain without sciatica 12/08/2017  . Adjustment disorder with mixed anxiety and depressed mood 11/13/2017  . At risk for abuse of opiates 11/13/2017  . Acute kidney failure, unspecified (HCC) 11/02/2017  . Rhabdomyolysis 11/02/2017  . Acute respiratory failure (HCC) 11/02/2017  . Cardiogenic shock (HCC) 10/30/2017  . Cardiac arrest (HCC) 10/30/2017  . Drug abuse (HCC)  10/30/2017     Past Surgical History:  Procedure Laterality Date  . AMPUTATION Right 11/03/2017   Procedure: KNEE DISARTICULATION;  Surgeon: Renford DillsSchnier, Gregory G, MD;  Location: [facility] ORS;  Service: Vascular;  Laterality: Right;  . AMPUTATION Right 11/10/2017   Procedure: AMPUTATION ABOVE KNEE;  Surgeon: Renford DillsSchnier, Gregory G, MD;  Location: [facility] ORS;  Service: Vascular;  Laterality: Right;  . ankle/foot surgery with metal plates    . DIALYSIS/PERMA CATHETER INSERTION N/A 11/16/2017   Procedure: DIALYSIS/PERMA CATHETER INSERTION;  Surgeon: Annice Needyew, Jason S, MD;  Location: [facility] INVASIVE CV LAB;  Service: Cardiovascular;  Laterality: N/A;  . DIALYSIS/PERMA CATHETER REMOVAL N/A 12/14/2017   Procedure: DIALYSIS/PERMA CATHETER REMOVAL;  Surgeon: Annice Needyew, Jason S, MD;  Location: [facility] INVASIVE CV LAB;  Service: Cardiovascular;  Laterality: N/A;  . permacath to right side     Scheduled to be removed     Prior to Admission medications   Medication Sig Start Date End Date Taking? Authorizing Provider  amLODipine (NORVASC) 5 MG tablet Take 1 tablet (5 mg total) by mouth at bedtime. 11/27/17   Alford HighlandWieting, Richard, MD  cephALEXin (KEFLEX) 500 MG capsule Take 1 capsule (500 mg total) by mouth 3 (three) times daily. 12/16/17   Sharman CheekStafford, Koree Staheli, MD  ferrous sulfate 325 (65 FE) MG tablet Take 1 tablet (325 mg total) by mouth 2 (two) times daily with a meal. 11/27/17  Alford Highland, MD  gabapentin (NEURONTIN) 100 MG capsule Take 2 capsules (200 mg total) by mouth 3 (three) times daily. 12/16/17 01/15/18  Sharman Cheek, MD  ibuprofen (MOTRIN IB) 200 MG tablet Take 3 tablets (600 mg total) by mouth every 6 (six) hours as needed. 12/16/17   Sharman Cheek, MD  sulfamethoxazole-trimethoprim (BACTRIM DS) 800-160 MG tablet Take 1 tablet by mouth 2 (two) times daily. 12/16/17   Sharman Cheek, MD     Allergies Patient has no known allergies.   Family History  Problem Relation Age of Onset  . Varicose Veins Neg Hx      Social History Social History   Tobacco Use  . Smoking status: Current Every Day Smoker    Packs/day: 0.50    Types: Cigarettes  . Smokeless tobacco: Never Used  Substance Use Topics  . Alcohol use: No  . Drug use: No    Review of Systems  Constitutional:   No fever or chills.  ENT:   No sore throat. No rhinorrhea. Cardiovascular:   No chest pain or syncope. Respiratory:   No dyspnea or cough. Gastrointestinal:   Negative for abdominal pain, vomiting and diarrhea.  Musculoskeletal:  right leg pain as above All other systems reviewed and are negative except as documented above in ROS and HPI.  ____________________________________________   PHYSICAL EXAM:  VITAL SIGNS: ED Triage Vitals  Enc Vitals Group     BP 12/16/17 0123 134/85     Pulse Rate 12/16/17 0123 (!) 105     Resp 12/16/17 0123 18     Temp 12/16/17 0123 98.1 F (36.7 C)     Temp Source 12/16/17 0123 Oral     SpO2 12/16/17 0123 100 %     Weight 12/16/17 0135 130 lb (59 kg)     Height 12/16/17 0135 5\' 10"  (1.778 m)     Head Circumference --      Peak Flow --      Pain Score 12/16/17 0135 10     Pain Loc --      Pain Edu? --      Excl. in GC? --     Vital signs reviewed, nursing assessments reviewed.   Constitutional:   Alert and oriented. Well appearing and in no distress. Eyes:   Conjunctivae are normal. EOMI. PERRL. ENT      Head:   Normocephalic and atraumatic.      Nose:   No congestion/rhinnorhea.       Mouth/Throat:   MMM, no pharyngeal erythema. No peritonsillar mass.       Neck:   No meningismus. Full ROM. Hematological/Lymphatic/Immunilogical:   No cervical lymphadenopathy. Cardiovascular:   RRR. Symmetric bilateral radial and DP pulses.  No murmurs.  Respiratory:   Normal respiratory effort without tachypnea/retractions. Breath sounds are clear and equal bilaterally. No wheezes/rales/rhonchi. Gastrointestinal:   Soft and nontender. Non distended. There is no CVA tenderness.  No  rebound, rigidity, or guarding. Genitourinary:   deferred Musculoskeletal:   status post right AKA. Incision well healed at the stump. No focal tenderness induration and fluctuance or drainage. No focal inflammatory changes. No crepitus. No edema. The right leg stump is diffusely tender. Tissues are soft. Left leg is unremarkable. Neurologic:   Normal speech and language.  Motor grossly intact. No acute focal neurologic deficits are appreciated.  Skin:    Skin is warm, dry and intact. No rash noted.  No petechiae, purpura, or bullae.  ____________________________________________    LABS (pertinent positives/negatives) (all  labs ordered are listed, but only abnormal results are displayed) Labs Reviewed - No data to display ____________________________________________   EKG    ____________________________________________    RADIOLOGY  US Venous Img Lower Unilateral Right  Result Date: 12/16/2017 CLINICAL DATA:  27 year old male with right lower extremity pain following above the knee amputation on 11/03/2017. EXAM: RIGHT LOWER EXTREMITY VENOUS DOPPLER ULTRASOUND TECHNIQUE: Gray-scale sonography with graded compression, as well as color Doppler and duplex ultrasound were performed to evaluate the lower extremity deep venous systems from the level of the common femoral vein and including the common femoral, femoral, profunda femoral, popliteal and calf veins including the posterior tibial, peroneal and gastrocnemius veins when visible. The superficial great saphenous vein was also interrogated. Spectral Doppler was utilized to evaluate flow at rest and with distal augmentation maneuvers in the common femoral, femoral and popliteal veins. COMPARISON:  None. FINDINGS: Contralateral Common Femoral Vein: Respiratory phasicity is normal and symmetric with the symptomatic side. No evidence of thrombus. Normal compressibility. Common Femoral Vein: No evidence of thrombus. Normal compressibility,  respiratory phasicity and response to augmentation. Saphenofemoral Junction: No evidence of thrombus. Normal compressibility and flow on color Doppler imaging. Profunda Femoral Vein: No evidence of thrombus. Normal compressibility and flow on color Doppler imaging. Femoral Vein: No evidence of thrombus. Normal compressibility, respiratory phasicity and response to augmentation. Popliteal Vein: Status post above the knee amputation Calf Veins: Status post above the knee amputation Superficial Great Saphenous Vein: No evidence of thrombus. Normal compressibility. Venous Reflux:  None. Other Findings: Reactive right inguinal lymphadenopathy. The largest node measures up to 1.1 cm in short axis. IMPRESSION: No evidence of deep venous thrombosis. Reactive right inguinal lymphadenopathy. Electronically Signed   By: Malachy Moan M.D.   On: 12/16/2017 08:45   Dg Femur Min 2 Views Right  Result Date: 12/16/2017 CLINICAL DATA:  Pain following recent above the knee amputation EXAM: RIGHT FEMUR 2 VIEWS COMPARISON:  None. FINDINGS: Frontal and lateral views were obtained. There is no acute fracture or dislocation. No erosive change or bony destruction evident. There is questionable edema in the stump region. Postoperative change at the site of amputation noted. IMPRESSION: Soft tissue edema in the stump region. Advise clinical assessment of this area. No bony abnormality evident. Specifically, no bony destruction or erosion. Postoperative change at the above the knee amputation site noted. No fracture or dislocation. Hip joint appears normal. Electronically Signed   By: Bretta Bang III M.D.   On: 12/16/2017 07:59    ____________________________________________   PROCEDURES Procedures  ____________________________________________  DIFFERENTIAL DIAGNOSIS   DVT, femur fracture, chronic musculoskeletal pain  CLINICAL IMPRESSION / ASSESSMENT AND PLAN / ED COURSE  Pertinent labs & imaging results that  were available during my care of the patient were reviewed by me and considered in my medical decision making (see chart for details).      Clinical Course as of Dec 17 931  Wed Dec 16, 2017  0737 Well appearing, NAD. Wants pain meds for rle after aka. Exam wnl. Well healed. Will check venous US and xray. Counseled patient that he should never take opioids again due to risks of dependence and overdose.   [PS]  0848 [place] and xray overall unremarkable without evidence of DVT or fx or osteomyelitis. There are ancillary findings of edema and inguinal lymphadenopathy to suggest developing cellulitis. I will prescribe antibiotics and advise close f/u with vascular surgery. Pt will again be counseled against seeking opioid medications due to high risk for  overdose and death.   [PS]    Clinical Course User Index [PS] Sharman Cheek, MD    ----------------------------------------- 9:37 AM on 12/16/2017 -----------------------------------------  I will continue patient on gabapentin and ibuprofen given his return to normal renal function. I have strongly advised the patient that he needs to seek outpatient treatment for substance abusing including regular attendance at Narcotics Anonymous meetings or some other support group. I have advised him and very plain language that he all but died at the beginning of last month from an overdose, and due to physiologic changes in neurologic function from substance abuse, he is at high risk for a relapsing dependence and overdose that may be fatal in the future. I will not prescribe any opioids and have advised him that he should avoid opioids at all cost in the future.   ____________________________________________   FINAL CLINICAL IMPRESSION(S) / ED DIAGNOSES    Final diagnoses:  Right leg pain  Chronic pain disorder     ED Discharge Orders        Ordered    gabapentin (NEURONTIN) 100 MG capsule  3 times daily     12/16/17 0928    ibuprofen  (MOTRIN IB) 200 MG tablet  Every 6 hours PRN     12/16/17 0928    sulfamethoxazole-trimethoprim (BACTRIM DS) 800-160 MG tablet  2 times daily     12/16/17 0928    cephALEXin (KEFLEX) 500 MG capsule  3 times daily     12/16/17 1610      Portions of this note were generated with dragon dictation software. Dictation errors may occur despite best attempts at proofreading.    Sharman Cheek, MD 12/16/17 [phone]

## 2017-12-16 NOTE — ED Notes (Signed)
Put sock on pt and gave him a walker, he was able to ambulate to toilet without assistance.

## 2017-12-16 NOTE — ED Notes (Signed)
Pt getting portable Xray at this time.

## 2017-12-16 NOTE — ED Notes (Signed)
When giving pt medications, he states "I am going to try it, but I know it wont work." Dr. Scotty CourtStafford and I explained why Narcotics will not be used. Pt did not verbalize understanding just looked with a blank stare.

## 2017-12-16 NOTE — ED Notes (Signed)
Pt wheeled out to lobby to charge his phone so he could call his mom to get him. Pt did not bring a phone charger so is using ours in the lobby. VSS. NAD. Discharge instructions, RX and follow up discussed. All questions and concerns were addressed.

## 2017-12-30 ENCOUNTER — Telehealth (INDEPENDENT_AMBULATORY_CARE_PROVIDER_SITE_OTHER): Payer: Self-pay | Admitting: Vascular Surgery

## 2017-12-31 NOTE — Telephone Encounter (Signed)
I recommend the patient find a primary care physician in the [place] area ASAP.  He has multiple medical issues that will need to be followed since his recent inpatient stay.  The patient would have to call [employer] to see the if they cover that far [place].  They may be able to refer him to a prosthetic company in that area.  The patient should research a pain clinic that he would like to go to and I will try to refer him through our system - I am not sure if it will work but if he can give me the name of the practice or a physician's name sure be happy to try.

## 2017-12-31 NOTE — Telephone Encounter (Signed)
Called the patient back to let him know what has ben advised. He will try reaching out in the area to get established with a PCP and then give [place] a call back once he's done that so that we can try to help facilitate getting him some help with getting into a pain clinic.

## 2018-01-07 ENCOUNTER — Encounter (INDEPENDENT_AMBULATORY_CARE_PROVIDER_SITE_OTHER): Payer: MEDICAID

## 2018-01-07 ENCOUNTER — Ambulatory Visit (INDEPENDENT_AMBULATORY_CARE_PROVIDER_SITE_OTHER): Payer: MEDICAID | Admitting: Vascular Surgery

## 2018-01-11 ENCOUNTER — Encounter (INDEPENDENT_AMBULATORY_CARE_PROVIDER_SITE_OTHER): Payer: Self-pay | Admitting: Vascular Surgery

## 2018-01-13 ENCOUNTER — Encounter: Payer: Self-pay | Admitting: Urology

## 2018-01-13 ENCOUNTER — Ambulatory Visit: Payer: Self-pay | Admitting: Urology

## 2018-03-21 ENCOUNTER — Emergency Department (HOSPITAL_COMMUNITY)
Admission: EM | Admit: 2018-03-21 | Discharge: 2018-03-21 | Disposition: A | Discharge status: Home / Self Care | Payer: Self-pay | Attending: Emergency Medicine | Admitting: Emergency Medicine

## 2018-03-21 ENCOUNTER — Encounter (HOSPITAL_COMMUNITY): Payer: Self-pay | Admitting: *Deleted

## 2018-03-21 DIAGNOSIS — K0889 Other specified disorders of teeth and supporting structures: Secondary | ICD-10-CM | POA: Insufficient documentation

## 2018-03-21 DIAGNOSIS — Z79899 Other long term (current) drug therapy: Secondary | ICD-10-CM | POA: Insufficient documentation

## 2018-03-21 DIAGNOSIS — I10 Essential (primary) hypertension: Secondary | ICD-10-CM | POA: Insufficient documentation

## 2018-03-21 DIAGNOSIS — F17210 Nicotine dependence, cigarettes, uncomplicated: Secondary | ICD-10-CM | POA: Insufficient documentation

## 2018-03-21 MED ORDER — IBUPROFEN 800 MG PO TABS: 800.0000 mg | ORAL_TABLET | Freq: Three times a day (TID) | ORAL | 0 refills | Status: DC

## 2018-03-21 MED ORDER — IBUPROFEN 800 MG PO TABS
800.0000 mg | ORAL_TABLET | Freq: Once | ORAL | Status: AC
  Administered 2018-03-21: 800 mg via ORAL
  Filled 2018-03-21: qty 1

## 2018-03-21 MED ORDER — AMOXICILLIN 500 MG PO CAPS: 500.0000 mg | ORAL_CAPSULE | Freq: Three times a day (TID) | ORAL | 0 refills | Status: DC

## 2018-03-21 MED ORDER — AMOXICILLIN 250 MG PO CAPS
500.0000 mg | ORAL_CAPSULE | Freq: Once | ORAL | Status: AC
  Administered 2018-03-21: 500 mg via ORAL
  Filled 2018-03-21: qty 2

## 2018-03-21 NOTE — ED Provider Notes (Signed)
[facility] EMERGENCY DEPARTMENT Provider Note   CSN: [id number] Arrival date & time: 03/21/18  1851     History   Chief Complaint Chief Complaint  Patient presents with  . Dental Pain    HPI Joshua [name] is a 27 y.o. male.  HPI   Joshua [name] is a 27 y.o. male who presents to the Emergency Department complaining of dental pain for several hours.  States that he was eating food when he is right upper wisdom tooth broke.  He reports history of dental decay of this tooth, but it has not been painful until today.  He describes a sharp stabbing pain to his right upper tooth that radiates toward his ear and into his temple.  Pain is worse with chewing and sensation of cold or hot food or liquids.  He is tried Orajel without relief.  He denies fever, facial swelling, neck pain, difficulty swallowing or breathing.  He currently does not have funds to see a Education officer, community.   Past Medical History:  Diagnosis Date  . Acute renal failure (ARF) (HCC)    History of   . Aspiration pneumonia (HCC)    Hx of  . Asthma   . Bronchitis   . Cardiac arrest (HCC)    hx of  . Cardiogenic shock (HCC)    hx of  . Drug overdose   . Hernia cerebri (HCC)   . History of acute respiratory failure   . Hypertension   . Polysubstance abuse (HCC)   . Rhabdomyolysis     Patient Active Problem List   Diagnosis Date Noted  . 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 Impotence 12/08/2017  . Chronic bilateral low back pain without sciatica 12/08/2017  . Adjustment disorder with mixed anxiety and depressed mood 11/13/2017  . At risk for abuse of opiates 11/13/2017  . Acute kidney failure, unspecified (HCC) 11/02/2017  . Rhabdomyolysis 11/02/2017  . Acute respiratory failure (HCC) 11/02/2017  . Cardiogenic shock (HCC) 10/30/2017  . Cardiac arrest (HCC) 10/30/2017  . Drug abuse (HCC) 10/30/2017    Past Surgical History:  Procedure Laterality Date  . AMPUTATION Right 11/03/2017   Procedure:  KNEE DISARTICULATION;  Surgeon: Renford Dills, MD;  Location: [facility] ORS;  Service: Vascular;  Laterality: Right;  . AMPUTATION Right 11/10/2017   Procedure: AMPUTATION ABOVE KNEE;  Surgeon: Renford Dills, MD;  Location: [facility] ORS;  Service: Vascular;  Laterality: Right;  . ankle/foot surgery with metal plates    . DIALYSIS/PERMA CATHETER INSERTION N/A 11/16/2017   Procedure: DIALYSIS/PERMA CATHETER INSERTION;  Surgeon: Annice Needy, MD;  Location: [facility] INVASIVE CV LAB;  Service: Cardiovascular;  Laterality: N/A;  . DIALYSIS/PERMA CATHETER REMOVAL N/A 12/14/2017   Procedure: DIALYSIS/PERMA CATHETER REMOVAL;  Surgeon: Annice Needy, MD;  Location: [facility] INVASIVE CV LAB;  Service: Cardiovascular;  Laterality: N/A;  . permacath to right side     Scheduled to be removed        Home Medications    Prior to Admission medications   Medication Sig Start Date End Date Taking? Authorizing Provider  amLODipine (NORVASC) 5 MG tablet Take 1 tablet (5 mg total) by mouth at bedtime. 11/27/17   Alford Highland, MD  amoxicillin (AMOXIL) 500 MG capsule Take 1 capsule (500 mg total) by mouth 3 (three) times daily. 03/21/18   Aaric Dolph, PA-C  cephALEXin (KEFLEX) 500 MG capsule Take 1 capsule (500 mg total) by mouth 3 (three) times daily. 12/16/17  Sharman Cheek, MD  ferrous sulfate 325 (65 FE) MG tablet Take 1 tablet (325 mg total) by mouth 2 (two) times daily with a meal. 11/27/17   Alford Highland, MD  gabapentin (NEURONTIN) 100 MG capsule Take 2 capsules (200 mg total) by mouth 3 (three) times daily. 12/16/17 01/15/18  Sharman Cheek, MD  ibuprofen (ADVIL,MOTRIN) 800 MG tablet Take 1 tablet (800 mg total) by mouth 3 (three) times daily. 03/21/18   Anaaya Fuster, PA-C  sulfamethoxazole-trimethoprim (BACTRIM DS) 800-160 MG tablet Take 1 tablet by mouth 2 (two) times daily. 12/16/17   Sharman Cheek, MD    Family History Family History  Problem Relation Age of Onset  . Varicose Veins Neg Hx       Social History Social History   Tobacco Use  . Smoking status: Current Every Day Smoker    Packs/day: 0.50    Types: Cigarettes  . Smokeless tobacco: Never Used  Substance Use Topics  . Alcohol use: No  . Drug use: No     Allergies   Patient has no known allergies.   Review of Systems Review of Systems  Constitutional: Negative for appetite change and fever.  HENT: Positive for dental problem. Negative for congestion, facial swelling, sore throat and trouble swallowing.   Eyes: Negative for pain and visual disturbance.  Musculoskeletal: Negative for neck pain and neck stiffness.  Neurological: Negative for dizziness, facial asymmetry and headaches.  Hematological: Negative for adenopathy.  All other systems reviewed and are negative.    Physical Exam Updated Vital Signs BP (!) 153/92 (BP Location: Right Arm)   Pulse 85   Temp 97.7 F (36.5 C) (Oral)   Resp 16   Ht 5\' 10"  (1.778 m)   Wt 65.8 kg (145 lb)   SpO2 100%   BMI 20.81 kg/m   Physical Exam  Constitutional: He appears well-developed and well-nourished. No distress.  HENT:  Head: Normocephalic and atraumatic.  Right Ear: Tympanic membrane and ear canal normal.  Left Ear: Tympanic membrane and ear canal normal.  Mouth/Throat: Uvula is midline, oropharynx is clear and moist and mucous membranes are normal. No trismus in the jaw. Dental caries present. No dental abscesses or uvula swelling.  Tenderness and dental caries of the right upper third molar.  No facial swelling, obvious dental abscess, trismus, or sublingual abnml.  Multiple dental caries are present.  Neck: Normal range of motion. Neck supple.  Cardiovascular: Normal rate, regular rhythm and normal heart sounds.  No murmur heard. Pulmonary/Chest: Effort normal and breath sounds normal.  Musculoskeletal: Normal range of motion.  Lymphadenopathy:    He has no cervical adenopathy.  Neurological: He is alert.  Skin: Skin is warm and dry.   Nursing note and vitals reviewed.    ED Treatments / Results  Labs (all labs ordered are listed, but only abnormal results are displayed) Labs Reviewed - No data to display  EKG None  Radiology No results found.  Procedures Procedures (including critical care time)  Medications Ordered in ED Medications  amoxicillin (AMOXIL) capsule 500 mg (500 mg Oral Given 03/21/18 1920)  ibuprofen (ADVIL,MOTRIN) tablet 800 mg (800 mg Oral Given 03/21/18 1920)     Initial Impression / Assessment and Plan / ED Course  I have reviewed the triage vital signs and the nursing notes.  Pertinent labs & imaging results that were available during my care of the patient were reviewed by me and considered in my medical decision making (see chart for details).  Patient well-appearing.  Airway is patent.  Vitals reviewed.  No concerning symptoms for dental abscess or Ludwig's angina.  Patient given prescription for amoxicillin and ibuprofen, dental referral information provided.  Final Clinical Impressions(s) / ED Diagnoses   Final diagnoses:  Pain, dental    ED Discharge Orders        Ordered    amoxicillin (AMOXIL) 500 MG capsule  3 times daily     03/21/18 1919    ibuprofen (ADVIL,MOTRIN) 800 MG tablet  3 times daily     03/21/18 1919       Pauline Ausriplett, Emeka Lindner, Cordelia Poche-C 03/21/18 1925    Margarita Grizzleay, Danielle, MD 03/21/18 2007

## 2018-03-21 NOTE — ED Triage Notes (Signed)
Pt with right upper dental pain that started today, pt states tooth is broke.

## 2018-03-21 NOTE — Discharge Instructions (Addendum)
Call 1 of the dentist on the list provided to arrange a follow-up appointment.

## 2018-05-26 ENCOUNTER — Telehealth (INDEPENDENT_AMBULATORY_CARE_PROVIDER_SITE_OTHER): Payer: Self-pay | Admitting: Vascular Surgery

## 2018-05-26 NOTE — Telephone Encounter (Signed)
I left a message on the voicemail for the probation office Aundra Millet to return a call back to the office to discuss the information he needed for the patient

## 2018-05-28 ENCOUNTER — Encounter (INDEPENDENT_AMBULATORY_CARE_PROVIDER_SITE_OTHER): Payer: Self-pay

## 2018-08-02 ENCOUNTER — Emergency Department (HOSPITAL_COMMUNITY): Payer: Self-pay

## 2018-08-02 ENCOUNTER — Other Ambulatory Visit: Payer: Self-pay

## 2018-08-02 ENCOUNTER — Emergency Department (HOSPITAL_COMMUNITY)
Admission: EM | Admit: 2018-08-02 | Discharge: 2018-08-02 | Disposition: A | Discharge status: Home / Self Care | Payer: Self-pay | Attending: Emergency Medicine | Admitting: Emergency Medicine

## 2018-08-02 ENCOUNTER — Encounter (HOSPITAL_COMMUNITY): Payer: Self-pay | Admitting: Emergency Medicine

## 2018-08-02 DIAGNOSIS — I10 Essential (primary) hypertension: Secondary | ICD-10-CM | POA: Insufficient documentation

## 2018-08-02 DIAGNOSIS — Z79899 Other long term (current) drug therapy: Secondary | ICD-10-CM | POA: Insufficient documentation

## 2018-08-02 DIAGNOSIS — R569 Unspecified convulsions: Secondary | ICD-10-CM

## 2018-08-02 DIAGNOSIS — J45909 Unspecified asthma, uncomplicated: Secondary | ICD-10-CM | POA: Insufficient documentation

## 2018-08-02 DIAGNOSIS — Z89611 Acquired absence of right leg above knee: Secondary | ICD-10-CM | POA: Insufficient documentation

## 2018-08-02 DIAGNOSIS — F17210 Nicotine dependence, cigarettes, uncomplicated: Secondary | ICD-10-CM | POA: Insufficient documentation

## 2018-08-02 LAB — COMPREHENSIVE METABOLIC PANEL
ALBUMIN: 4.4 g/dL (ref 3.5–5.0)
ALT: 19 U/L (ref 0–44)
AST: 20 U/L (ref 15–41)
Alkaline Phosphatase: 32 U/L — ABNORMAL LOW (ref 38–126)
Anion gap: 13 (ref 5–15)
BUN: 9 mg/dL (ref 6–20)
CHLORIDE: 103 mmol/L (ref 98–111)
CO2: 21 mmol/L — ABNORMAL LOW (ref 22–32)
Calcium: 9.3 mg/dL (ref 8.9–10.3)
Creatinine, Ser: 0.96 mg/dL (ref 0.61–1.24)
GFR calc Af Amer: >60 mL/min (ref >60)
GFR calc non Af Amer: >60 mL/min (ref >60)
Glucose, Bld: 100 mg/dL — ABNORMAL HIGH (ref 70–99)
POTASSIUM: 4.3 mmol/L (ref 3.5–5.1)
Sodium: 137 mmol/L (ref 135–145)
Total Bilirubin: 0.7 mg/dL (ref 0.3–1.2)
Total Protein: 7.8 g/dL (ref 6.5–8.1)

## 2018-08-02 LAB — URINALYSIS, ROUTINE W REFLEX MICROSCOPIC
BILIRUBIN URINE: NEGATIVE
Glucose, UA: NEGATIVE mg/dL
Hgb urine dipstick: NEGATIVE
Ketones, ur: NEGATIVE mg/dL
Leukocytes, UA: NEGATIVE
Nitrite: NEGATIVE
Protein, ur: 30 mg/dL — AB
Specific Gravity, Urine: 1.023 (ref 1.005–1.030)
pH: 6 (ref 5.0–8.0)

## 2018-08-02 LAB — RAPID URINE DRUG SCREEN, HOSP PERFORMED
Amphetamines: NOT DETECTED
BARBITURATES: NOT DETECTED
Benzodiazepines: NOT DETECTED
Cocaine: NOT DETECTED
Opiates: NOT DETECTED
Tetrahydrocannabinol: NOT DETECTED

## 2018-08-02 LAB — CBC
HEMATOCRIT: 50.2 % (ref 39.0–52.0)
HEMOGLOBIN: 16.3 g/dL (ref 13.0–17.0)
MCH: 30.1 pg (ref 26.0–34.0)
MCHC: 32.5 g/dL (ref 30.0–36.0)
MCV: 92.8 fL (ref 80.0–100.0)
Platelets: 300 10*3/uL (ref 150–400)
RBC: 5.41 MIL/uL (ref 4.22–5.81)
RDW: 11.3 % — ABNORMAL LOW (ref 11.5–15.5)
WBC: 12.9 10*3/uL — ABNORMAL HIGH (ref 4.0–10.5)
nRBC: 0 % (ref 0.0–0.2)

## 2018-08-02 LAB — CK: Total CK: 95 U/L (ref 49–397)

## 2018-08-02 LAB — PHOSPHORUS: PHOSPHORUS: 1.9 mg/dL — AB (ref 2.5–4.6)

## 2018-08-02 LAB — CBG MONITORING, ED: Glucose-Capillary: 101 mg/dL — ABNORMAL HIGH (ref 70–99)

## 2018-08-02 LAB — SALICYLATE LEVEL: Salicylate Lvl: <7 mg/dL (ref 2.8–30.0)

## 2018-08-02 LAB — MAGNESIUM: Magnesium: 2.2 mg/dL (ref 1.7–2.4)

## 2018-08-02 MED ORDER — POTASSIUM & SODIUM PHOSPHATES 280-160-250 MG PO PACK
2.0000 | PACK | Freq: Three times a day (TID) | ORAL | Status: DC
  Filled 2018-08-02 (×2): qty 2

## 2018-08-02 MED ORDER — LORAZEPAM 1 MG PO TABS: 1.0000 mg | ORAL_TABLET | Freq: Once | ORAL | Status: DC

## 2018-08-02 MED ORDER — LORAZEPAM 2 MG/ML IJ SOLN
1.0000 mg | Freq: Once | INTRAMUSCULAR | Status: AC
  Administered 2018-08-02: 1 mg via INTRAVENOUS
  Filled 2018-08-02: qty 1

## 2018-08-02 MED ORDER — POTASSIUM & SODIUM PHOSPHATES 280-160-250 MG PO PACK
2.0000 | PACK | ORAL | Status: AC
  Administered 2018-08-02: 2 via ORAL
  Filled 2018-08-02: qty 2

## 2018-08-02 MED ORDER — LEVETIRACETAM IN NACL 1000 MG/100ML IV SOLN
1000.0000 mg | Freq: Once | INTRAVENOUS | Status: AC
  Administered 2018-08-02: 1000 mg via INTRAVENOUS
  Filled 2018-08-02: qty 100

## 2018-08-02 MED ORDER — LEVETIRACETAM 500 MG PO TABS: 500.0000 mg | ORAL_TABLET | Freq: Two times a day (BID) | ORAL | 0 refills | Status: DC

## 2018-08-02 NOTE — Discharge Planning (Signed)
[facility].  [facility] and is eligible to make follow-up appointment.  [facility] can also assist pt with medication and disability forms as needed. EDCM spoke with pt and sister at bedside who agree [facility] as he is already active.  ED CM consulted by EDP [place] for medication assistance. EDCM reviewed chart and spoke with the pt about [facility] ($3 co [facility] for [facility], does not include refills, 7 day expiration of MATCH letter and choice of pharmacies). [facility] (unable to find pt listed in PROCARE per cardholder name inquiry) and has agreed to accept MATCH under terms discussed. PROCARE information entered. MATCH letter completed and provided to pt.  EDCM updated EDP and ED RN.   EDCM also confirmed that pt does not have PCP. NCM discussed and provided written information for uninsured PCP and the importance of PCP for f/u care.

## 2018-08-02 NOTE — ED Notes (Signed)
Another nurse attempting to get medication from pharmacy

## 2018-08-02 NOTE — ED Notes (Signed)
Spoke with Doctor and Pharmacist regarding PHOS NAK. Ordered then D/C yellow on MAR. To given prior to discharge. Do not have in ED pyxis and will be sent shortly via tube station.

## 2018-08-02 NOTE — ED Notes (Signed)
Social worker at bedside.

## 2018-08-02 NOTE — ED Notes (Signed)
Pt returns from radiology. 

## 2018-08-02 NOTE — Discharge Instructions (Addendum)
Thank you for allowing us to be a part of your care.   I have prescribed Keppra 500 mg tablet to help prevent seizures. Please take this medication every 12 hours.   Per [facility]  [place] statutes, patients with seizures are not allowed to drive until  they have been seizure-free for six months. Use caution when using heavy equipment or power tools. Avoid working on ladders or at heights. Take showers instead of baths. Ensure the water temperature is not too high on the home water heater. Do not go swimming alone. When caring for infants or small children, sit down when holding, feeding, or changing them to minimize risk of injury to the child in the event you have a seizure.  Also, Maintain good sleep hygiene. Avoid alcohol.

## 2018-08-02 NOTE — ED Triage Notes (Signed)
Pt with gran mal seizure this AM, lasted approx per bystanders. Has hx of seizures, not on meds for same. Right leg AKA, hx of renal failure. 2 prev sz. Post ictal at present, oriented x4. 139/84, HR 86, RR 19, 98 RA, CBG 124.

## 2018-08-02 NOTE — Progress Notes (Signed)
CSW made aware by MD that pt is in need of PCP and medication assistance. CSW expressed that CSW would update RNCM of this need.     Claude MangesKierra S. Stevana Dufner, MSW, LCSW-A Emergency Department Clinical Social Worker [phone]

## 2018-08-02 NOTE — ED Provider Notes (Addendum)
[facility] EMERGENCY DEPARTMENT Provider Note   CSN: [id number] Arrival date & time: 08/02/18  0758     History   Chief Complaint Chief Complaint  Patient presents with  . Seizures    HPI Joshua [name] is a 27 y.o. 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 Patient was lying in bed and did not fall or injure himself except for biting his tongue. Family states he was confused for about five minutes after seizure. He does not recall events, only waking up after falling asleep last night and being told he had a seizure. He has had two-three seizures since his overdose in March in which he was found unresponsive and resuscitated after being down for an unknown amount of time, maybe 12 hours. He does not take any medications, denies recent illnesses, sick contacts. Previous seizures have not been worked up in the outpatient setting due to patient not having disability or insurance.   HPI  Past Medical History:  Diagnosis Date  . Acute renal failure (ARF) (HCC)    History of   . Aspiration pneumonia (HCC)    Hx of  . Asthma   . Bronchitis   . Cardiac arrest (HCC)    hx of  . Cardiogenic shock (HCC)    hx of  . Drug overdose   . Hernia cerebri (HCC)   . History of acute respiratory failure   . Hypertension   . Polysubstance abuse (HCC)   . Rhabdomyolysis     Patient Active Problem List   Diagnosis Date Noted  . 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 Impotence 12/08/2017  . Chronic bilateral low back pain without sciatica 12/08/2017  . Adjustment disorder with mixed anxiety and depressed mood 11/13/2017  . At risk for abuse of opiates 11/13/2017  . Acute kidney failure, unspecified (HCC) 11/02/2017  . Rhabdomyolysis 11/02/2017  . Acute respiratory failure (HCC) 11/02/2017  . Cardiogenic shock  (HCC) 10/30/2017  . Cardiac arrest (HCC) 10/30/2017  . Drug abuse (HCC) 10/30/2017    Past Surgical History:  Procedure Laterality Date  . AMPUTATION Right 11/03/2017   Procedure: KNEE DISARTICULATION;  Surgeon: Renford Dills, MD;  Location: [facility] ORS;  Service: Vascular;  Laterality: Right;  . AMPUTATION Right 11/10/2017   Procedure: AMPUTATION ABOVE KNEE;  Surgeon: Renford Dills, MD;  Location: [facility] ORS;  Service: Vascular;  Laterality: Right;  . ankle/foot surgery with metal plates    . DIALYSIS/PERMA CATHETER INSERTION N/A 11/16/2017   Procedure: DIALYSIS/PERMA CATHETER INSERTION;  Surgeon: Annice Needy, MD;  Location: [facility] INVASIVE CV LAB;  Service: Cardiovascular;  Laterality: N/A;  . DIALYSIS/PERMA CATHETER REMOVAL N/A 12/14/2017   Procedure: DIALYSIS/PERMA CATHETER REMOVAL;  Surgeon: Annice Needy, MD;  Location: [facility] INVASIVE CV LAB;  Service: Cardiovascular;  Laterality: N/A;  . permacath to right side     Scheduled to be removed        Home Medications    Prior to Admission medications   Medication Sig Start Date End Date Taking? Authorizing Provider  amLODipine (NORVASC) 5 MG tablet Take 1 tablet (5 mg total) by mouth at bedtime. 11/27/17   Alford Highland, MD  amoxicillin (AMOXIL) 500 MG capsule Take 1 capsule (500 mg total) by mouth 3 (three) times daily. 03/21/18   Triplett, Tammy, PA-C  cephALEXin (KEFLEX) 500 MG  capsule Take 1 capsule (500 mg total) by mouth 3 (three) times daily. 12/16/17   Sharman CheekStafford, Phillip, MD  ferrous sulfate 325 (65 FE) MG tablet Take 1 tablet (325 mg total) by mouth 2 (two) times daily with a meal. 11/27/17   Alford HighlandWieting, Richard, MD  gabapentin (NEURONTIN) 100 MG capsule Take 2 capsules (200 mg total) by mouth 3 (three) times daily. 12/16/17 01/15/18  Sharman CheekStafford, Phillip, MD  ibuprofen (ADVIL,MOTRIN) 800 MG tablet Take 1 tablet (800 mg total) by mouth 3 (three) times daily. 03/21/18   Triplett, Tammy, PA-C  sulfamethoxazole-trimethoprim (BACTRIM DS)  800-160 MG tablet Take 1 tablet by mouth 2 (two) times daily. 12/16/17   Sharman CheekStafford, Phillip, MD    Family History Family History  Problem Relation Age of Onset  . Varicose Veins Neg Hx     Social History Social History   Tobacco Use  . Smoking status: Current Every Day Smoker    Packs/day: 0.50    Types: Cigarettes  . Smokeless tobacco: Never Used  Substance Use Topics  . Alcohol use: No  . Drug use: No     Allergies   Codeine   Review of Systems Review of Systems  Constitutional: Positive for fatigue. Negative for activity change and appetite change.  HENT: Negative for congestion, ear pain, sinus pressure and sinus pain. Mouth sores: bit tongue.   Eyes: Negative for photophobia, pain and visual disturbance.  Respiratory: Negative for cough, chest tightness, shortness of breath and wheezing.   Cardiovascular: Negative for chest pain and leg swelling.  Gastrointestinal: Negative for abdominal distention and abdominal pain.  Musculoskeletal: Positive for back pain.  Neurological: Positive for dizziness and weakness. Negative for tremors, speech difficulty, light-headedness, numbness and headaches.  Psychiatric/Behavioral: Negative for agitation, confusion, decreased concentration and hallucinations.   Ten systems reviewed and are negative for acute change, except as noted in the HPI.    Physical Exam Updated Vital Signs BP 140/83   Pulse 95   Temp 98.4 F (36.9 C) (Oral)   Resp 20   Ht 5\' 10"  (1.778 m)   Wt 74.8 kg   SpO2 94%   BMI 23.68 kg/m   Physical Exam  Constitutional: He is oriented to person, place, and time. He appears well-developed and well-nourished.  HENT:  Head: Head is without abrasion and without contusion.  Eyes: Pupils are equal, round, and reactive to light. Conjunctivae and EOM are normal.  Cardiovascular: Normal rate, regular rhythm and normal heart sounds.  Pulmonary/Chest: Effort normal and breath sounds normal.  Abdominal: Soft. Bowel  sounds are normal. There is no tenderness.  Musculoskeletal: Normal range of motion.  Neurological: He is alert and oriented to person, place, and time. He has normal strength. No cranial nerve deficit or sensory deficit. He exhibits normal muscle tone. Coordination normal.  Psychiatric: He has a normal mood and affect. His speech is normal and behavior is normal.     ED Treatments / Results  Labs (all labs ordered are listed, but only abnormal results are displayed) Labs Reviewed  COMPREHENSIVE METABOLIC PANEL - Abnormal; Notable for the following components:      Result Value   CO2 21 (*)    Glucose, Bld 100 (*)    Alkaline Phosphatase 32 (*)    All other components within normal limits  CBC - Abnormal; Notable for the following components:   WBC 12.9 (*)    RDW 11.3 (*)    All other components within normal limits  PHOSPHORUS - Abnormal; Notable  for the following components:   Phosphorus 1.9 (*)    All other components within normal limits  URINALYSIS, ROUTINE W REFLEX MICROSCOPIC - Abnormal; Notable for the following components:   Protein, ur 30 (*)    Bacteria, UA RARE (*)    All other components within normal limits  CBG MONITORING, ED - Abnormal; Notable for the following components:   Glucose-Capillary 101 (*)    All other components within normal limits  MAGNESIUM  SALICYLATE LEVEL  CK  RAPID URINE DRUG SCREEN, HOSP PERFORMED    EKG EKG Interpretation  Date/Time:  Monday August 02 2018 08:15:59 EST Ventricular Rate:  86 PR Interval:    QRS Duration: 94 QT Interval:  349 QTC Calculation: 418 R Axis:   13 Text Interpretation:  Sinus rhythm Prominent P waves, nondiagnostic No significant change since last tracing Confirmed by Jacalyn Lefevre [phone]) on 08/02/2018 8:31:59 AM   Radiology Dg Chest 2 View  Result Date: 08/02/2018 CLINICAL DATA:  Seizure today. EXAM: CHEST - 2 VIEW COMPARISON:  None. FINDINGS: The heart size and mediastinal contours are within  normal limits. Both lungs are clear. The visualized skeletal structures are unremarkable. IMPRESSION: Normal exam. Electronically Signed   By: Francene Boyers M.D.   On: 08/02/2018 09:59   Ct Head Wo Contrast  Result Date: 08/02/2018 CLINICAL DATA:  Encephalopathy.  Renal failure. EXAM: CT HEAD WITHOUT CONTRAST TECHNIQUE: Contiguous axial images were obtained from the base of the skull through the vertex without intravenous contrast. COMPARISON:  None. FINDINGS: Brain: The ventricles are normal in size and configuration. There is no intracranial mass, hemorrhage, extra-axial fluid collection, or midline shift. The brain parenchyma appears normal. No acute infarct is demonstrable. Vascular: No hyperdense vessel. No vascular calcifications are evident. Skull: Bony calvarium appears intact. Sinuses/Orbits: There is opacification in portions of the left sphenoid sinus with mucosal thickening in the left sphenoid sinus. There is mucosal thickening and opacification in multiple ethmoid air cells. Other visualized paranasal sinuses are clear. Visualized orbits appear symmetric bilaterally. Other: Mastoid air cells are clear. IMPRESSION: Brain parenchyma appears unremarkable.  No mass or hemorrhage. There are foci of paranasal sinus disease. Electronically Signed   By: Bretta Bang III M.D.   On: 08/02/2018 09:39    Procedures Procedures (including critical care time)  Medications Ordered in ED Medications  potassium & sodium phosphates (PHOS-NAK) 280-160-250 MG packet 2 packet (has no administration in time range)  levETIRAcetam (KEPPRA) IVPB 1000 mg/100 mL premix (0 mg Intravenous Stopped 08/02/18 0909)  LORazepam (ATIVAN) injection 1 mg (1 mg Intravenous Given 08/02/18 0930)     Initial Impression / Assessment and Plan / ED Course  I have reviewed the triage vital signs and the nursing notes.  Pertinent labs & imaging results that were available during my care of the patient were reviewed by me and  considered in my medical decision making (see chart for details).  Clinical Course as of Aug 02 1154  Mon Aug 02, 2018  5284 History of seizures the past 9 months after cardiac arrest s/p drug overdose with unknown downtime. Not on any medications currently. Work-up as if new onset seizure.    [JS]  1040 UDS negative, Labs unremarkable besides hypophosphatemic to 1.9. Will replete, consult social work for assistance in establishing PCP and will consult neurology.    [JS]    Clinical Course User Index [JS] , Shanon Brow, DO    27yo male presenting with recurrent seizure that has not be previously  worked up in the outpatient setting due to not having insurance. Seizure is likely secondary to anoxic brain injury s/p drug overdose in March of  This year. CT without acute findings today, labs unremarkable except for low phosphate of 1.9. Repleted phosphate and discussed case with neurology. Will discharge with 500 mg keppra bid and have him meet with social work to try and establish care with PCP as they have avenues of obtaining referrals which may be affordable.  Provided information that pt must be seizure free for six months before driving, opperating heavy machinery, to take showers instead of baths. Ensure the water temperature is not too high on the home water heater. Do not go swimming alone. When caring for infants or small children, sit down when holding, feeding, or changing them to minimize risk of injury to the child in the event you have a seizure.  Also recommended maintaining good sleep hygiene and to avoid alcohol. Patient will be discharged with Keppra 500 mg bid and will establish with PCP to continue care.    Final Clinical Impressions(s) / ED Diagnoses   Final diagnoses:  Seizure [facility])  Hypophosphatemia    ED Discharge Orders    None       ,  A, DO 08/02/18 1155    ,  A, DO 08/02/18 1159    Jacalyn Lefevre, MD 08/02/18 1307

## 2018-08-02 NOTE — ED Notes (Signed)
Signature pad not available. Patient and mother verbalized understanding of discharge instructions.

## 2018-09-02 DIAGNOSIS — G40909 Epilepsy, unspecified, not intractable, without status epilepticus: Secondary | ICD-10-CM | POA: Insufficient documentation

## 2018-09-02 DIAGNOSIS — F332 Major depressive disorder, recurrent severe without psychotic features: Secondary | ICD-10-CM | POA: Insufficient documentation

## 2018-09-02 DIAGNOSIS — Z87448 Personal history of other diseases of urinary system: Secondary | ICD-10-CM | POA: Insufficient documentation

## 2018-09-03 ENCOUNTER — Other Ambulatory Visit: Payer: Self-pay | Admitting: Family Medicine

## 2018-09-03 DIAGNOSIS — G40909 Epilepsy, unspecified, not intractable, without status epilepticus: Secondary | ICD-10-CM

## 2018-09-05 DIAGNOSIS — F064 Anxiety disorder due to known physiological condition: Secondary | ICD-10-CM | POA: Insufficient documentation

## 2018-09-06 ENCOUNTER — Ambulatory Visit (HOSPITAL_COMMUNITY)
Admission: RE | Admit: 2018-09-06 | Discharge: 2018-09-06 | Disposition: A | Discharge status: Home / Self Care | Payer: Medicaid Other | Source: Ambulatory Visit | Attending: Family Medicine | Admitting: Family Medicine

## 2018-09-06 DIAGNOSIS — G40909 Epilepsy, unspecified, not intractable, without status epilepticus: Secondary | ICD-10-CM | POA: Diagnosis not present

## 2018-09-07 ENCOUNTER — Encounter: Payer: Self-pay | Admitting: Neurology

## 2018-09-16 ENCOUNTER — Other Ambulatory Visit: Payer: Self-pay | Admitting: Internal Medicine

## 2018-09-20 ENCOUNTER — Encounter

## 2018-09-20 ENCOUNTER — Encounter: Payer: Self-pay | Admitting: Neurology

## 2018-09-20 ENCOUNTER — Ambulatory Visit (INDEPENDENT_AMBULATORY_CARE_PROVIDER_SITE_OTHER): Payer: Self-pay | Admitting: Neurology

## 2018-09-20 ENCOUNTER — Other Ambulatory Visit: Payer: Self-pay

## 2018-09-20 VITALS — BP 140/78 | HR 78 | Ht 70.0 in | Wt 167.0 lb

## 2018-09-20 DIAGNOSIS — F322 Major depressive disorder, single episode, severe without psychotic features: Secondary | ICD-10-CM

## 2018-09-20 DIAGNOSIS — G40009 Localization-related (focal) (partial) idiopathic epilepsy and epileptic syndromes with seizures of localized onset, not intractable, without status epilepticus: Secondary | ICD-10-CM

## 2018-09-20 MED ORDER — LEVETIRACETAM 500 MG PO TABS: 500.0000 mg | ORAL_TABLET | Freq: Two times a day (BID) | ORAL | 11 refills | Status: DC

## 2018-09-20 NOTE — Patient Instructions (Addendum)
1. Schedule 1-hour EEG 2. Continue Levetiracetam (Keppra) 500mg : Take 1 tablet twice a day 3. Recommend going to [facility] for depression and anxiety  [facility] Address: [address] [place], [place], [place] [address]  Phone: [phone]  4. Discuss Pain Management with your PCP 5. Follow-up 6 months, call for any changes  Seizure Precautions: 1. If medication has been prescribed for you to prevent seizures, take it exactly as directed.  Do not stop taking the medicine without talking to your doctor first, even if you have not had a seizure in a long time.   2. Avoid activities in which a seizure would cause danger to yourself or to others.  Don't operate dangerous machinery, swim alone, or climb in high or dangerous places, such as on ladders, roofs, or girders.  Do not drive unless your doctor says you may.  3. If you have any warning that you may have a seizure, lay down in a safe place where you can't hurt yourself.    4.  No driving for 6 months from last seizure, as per [facility].   Please refer to the following link on the [employer]'s website for more information: http://www.epilepsyfoundation.org/answerplace/Social/driving/drivingu.cfm   5.  Maintain good sleep hygiene. Avoid alcohol.  6.  Contact your doctor if you have any problems that may be related to the medicine you are taking.  7.  Call 911 and bring the patient back to the ED if:        A.  The seizure lasts longer than 5 minutes.       B.  The patient doesn't awaken shortly after the seizure  C.  The patient has new problems such as difficulty seeing, speaking or moving  D.  The patient was injured during the seizure  E.  The patient has a temperature over 102 F (39C)  F.  The patient vomited and now is having trouble breathing

## 2018-09-20 NOTE — Progress Notes (Signed)
NEUROLOGY CONSULTATION NOTE  Joshua [name] MRN: [id number] DOB: October 13, 1990  Referring provider: Dr. Sullivan Lone Primary care provider: Dr. Sullivan Lone  Reason for consult:  seizures  Dear Dr Janeece Riggers:  Thank you for your kind referral of Joshua [name] for consultation of the above symptoms. Although his history is well known to you, please allow me to reiterate it for the purpose of our medical record. The patient was accompanied to the clinic by his mother who also provides collateral information. Records and images were personally reviewed where available.  HISTORY OF PRESENT ILLNESS: This is a 28 year old right-handed man with a history of cardiac arrest in March 2019 secondary to polysubstance abuse, presenting for evaluation of seizures. No record of seizures during his hospitalization in March 2019. He and his mother report the first seizure occurred in May 2019 when he had 2 convulsions back to back. There was no prior warning, he recalls feeling stressed out, severely depressed and anxious for a few days prior. He was sitting on a chair when he had the seizures. He awoke to EMS around him, no tongue bite or incontinence, and was brought to [facility] where he was started on Keppra. He did not think he needed it and stopped taking it. He recalls some constipation while taking it. He had another seizure on 08/02/18 while asleep that was witnessed by his mother, his mother reports generalized body jerking with his eyes rolled back for 10 minutes. He recalls feeling depressed and anxious that day, not feeling right. He told family he would try to go back to sleep then had the convulsion with tongue bite. He was confused on EMS arrival. UDS at that time negative. He was discharged home on Keppra 500mg  BID and again reports some constipation. He saw his PCP who ordered an MRI brain without contrast done 09/06/2018 which I personally reviewed, no acute changes,  hippocampi symmetric. There was note of bilateral basal ganglia mild magnetic susceptibility effect.   He was admitted for cardiac arrest secondary to substance abuse in March 2019. 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 He was also noted to have chronic pain on Neurontin, oxycodone was discontinued. He has not been able to get Neurontin or Xanax, which he states really helped a lot. He is very depressed in the office today with poor eye contact. He has not started Zoloft yet. He states he hurts all the time, and his brace makes his leg hurt more. He has headaches, neck and back pain, and poor sleep. He denies any dizziness, diplopia, dysarthria/dysphagia, bladder dysfunction. He denies any olfactory/gustatory hallucinations, rising epigastric sensation, focal numbness/tingling/weakness, myoclonic jerks.   Epilepsy Risk Factors:  History of hypoxic injury in March 2019. His maternal grandfather had seizures with leukemia. Otherwise he had a normal birth and early development.  There is no history of febrile convulsions, CNS infections such as meningitis/encephalitis,  PAST MEDICAL HISTORY: Past Medical History:  Diagnosis Date  . Acute renal failure (ARF) (HCC)    History of   . Aspiration pneumonia (HCC)    Hx of  . Asthma   . Bronchitis   . Cardiac arrest (HCC)    hx of  . Cardiogenic shock (HCC)    hx of  . Drug overdose   . Hernia cerebri (HCC)   . History of acute respiratory failure   . Hypertension   .  Polysubstance abuse (HCC)   . Rhabdomyolysis     PAST SURGICAL HISTORY: Past Surgical History:  Procedure Laterality Date  . AMPUTATION Right 11/03/2017   Procedure: KNEE DISARTICULATION;  Surgeon: Renford DillsSchnier, Gregory G, MD;  Location: [facility] ORS;  Service: Vascular;  Laterality: Right;  . AMPUTATION Right 11/10/2017   Procedure: AMPUTATION ABOVE KNEE;  Surgeon:  Renford DillsSchnier, Gregory G, MD;  Location: [facility] OR[facility];  Service: Vascular;  Laterality: Right;  . ankle/foot surgery with metal plates    . DIALYSIS/PERMA CATHETER INSERTION N/A 11/16/2017   Procedure: DIALYSIS/PERMA CATHETER INSERTION;  Surgeon: Annice Needyew, Jason S, MD;  Location: [facility] INVASIVE CV LAB;  Service: Cardiovascular;  Laterality: N/A;  . DIALYSIS/PERMA CATHETER REMOVAL N/A 12/14/2017   Procedure: DIALYSIS/PERMA CATHETER REMOVAL;  Surgeon: Annice Needyew, Jason S, MD;  Location: [facility] INV[facility] CV [facility];  Service: Cardiovascular;  Laterality: N/A;  . permacath to right side     Scheduled to be removed    MEDICATIONS: Current Outpatient Medications on File Prior to Visit  Medication Sig Dispense Refill  . amLODipine (NORVASC) 5 MG tablet Take 1 tablet (5 mg total) by mouth at bedtime. 30 tablet 0  . amoxicillin (AMOXIL) 500 MG capsule Take 1 capsule (500 mg total) by mouth 3 (three) times daily. 30 capsule 0  . cephALEXin (KEFLEX) 500 MG capsule Take 1 capsule (500 mg total) by mouth 3 (three) times daily. 21 capsule 0  . ferrous sulfate 325 (65 FE) MG tablet Take 1 tablet (325 mg total) by mouth 2 (two) times daily with a meal. 60 tablet 0  . gabapentin (NEURONTIN) 100 MG capsule Take 2 capsules (200 mg total) by mouth 3 (three) times daily. 180 capsule 0  . ibuprofen (ADVIL,MOTRIN) 800 MG tablet Take 1 tablet (800 mg total) by mouth 3 (three) times daily. 21 tablet 0  . levETIRAcetam (KEPPRA) 500 MG tablet Take 1 tablet (500 mg total) by mouth 2 (two) times daily. 60 tablet 0  . sulfamethoxazole-trimethoprim (BACTRIM DS) 800-160 MG tablet Take 1 tablet by mouth 2 (two) times daily. 14 tablet 0   No current facility-administered medications on file prior to visit.     ALLERGIES: Allergies  Allergen Reactions  . Codeine Other (See Comments)    seizures    FAMILY HISTORY: Family History  Problem Relation Age of Onset  . Varicose Veins Neg Hx     SOCIAL HISTORY: Social History   Socioeconomic History    . Marital status: Legally Separated    Spouse name: Not on file  . Number of children: Not on file  . Years of education: Not on file  . Highest education level: Not on file  Occupational History  . Not on file  Social Needs  . Financial resource strain: Not on file  . Food insecurity:    Worry: Not on file    Inability: Not on file  . Transportation needs:    Medical: Not on file    Non-medical: Not on file  Tobacco Use  . Smoking status: Current Every Day Smoker    Packs/day: 0.50    Types: Cigarettes  . Smokeless tobacco: Never Used  Substance and Sexual Activity  . Alcohol use: No  . Drug use: No  . Sexual activity: Not on file  Lifestyle  . Physical activity:    Days per week: Not on file    Minutes per session: Not on file  . Stress: Not on file  Relationships  . Social connections:    Talks on  phone: Not on file    Gets together: Not on file    Attends religious service: Not on file    Active member of club or organization: Not on file    Attends meetings of clubs or organizations: Not on file    Relationship status: Not on file  . Intimate partner violence:    Fear of current or ex partner: Not on file    Emotionally abused: Not on file    Physically abused: Not on file    Forced sexual activity: Not on file  Other Topics Concern  . Not on file  Social History Narrative  . Not on file    REVIEW OF SYSTEMS: Constitutional: No fevers, chills, or sweats, no generalized fatigue, change in appetite Eyes: No visual changes, double vision, eye pain Ear, nose and throat: No hearing loss, ear pain, nasal congestion, sore throat Cardiovascular: No chest pain, palpitations Respiratory:  No shortness of breath at rest or with exertion, wheezes GastrointestinaI: No nausea, vomiting, diarrhea, abdominal pain, fecal incontinence Genitourinary:  No dysuria, urinary retention or frequency Musculoskeletal:  + neck pain, back pain Integumentary: No rash, pruritus, skin  lesions Neurological: as above Psychiatric: + depression, insomnia, anxiety Endocrine: No palpitations, fatigue, diaphoresis, mood swings, change in appetite, change in weight, increased thirst Hematologic/Lymphatic:  No anemia, purpura, petechiae. Allergic/Immunologic: no itchy/runny eyes, nasal congestion, recent allergic reactions, rashes  PHYSICAL EXAM: Vitals:   09/20/18 0943  BP: 140/78  Pulse: 78  SpO2: 99%   General: No acute distress, very depressed with poor eye contact, tearful several times during the visit Head:  Normocephalic/atraumatic Eyes: Fundoscopic exam shows bilateral sharp discs, no vessel changes, exudates, or hemorrhages Neck: supple, no paraspinal tenderness, full range of motion Back: No paraspinal tenderness Heart: regular rate and rhythm Lungs: Clear to auscultation bilaterally. Vascular: No carotid bruits. Skin/Extremities: No rash, no edema. S/p right AKA with brace Neurological Exam: Mental status: alert and oriented to person, place, and time, no dysarthria or aphasia, Fund of knowledge is appropriate.  Recent and remote memory are intact.  Attention and concentration are normal.    Able to name objects and repeat phrases. Cranial nerves: CN I: not tested CN II: pupils equal, round and reactive to light, visual fields intact, fundi unremarkable. CN III, IV, VI:  full range of motion, no nystagmus, no ptosis CN V: facial sensation intact CN VII: upper and lower face symmetric CN VIII: hearing intact to finger rub CN IX, X: gag intact, uvula midline CN XI: sternocleidomastoid and trapezius muscles intact CN XII: tongue midline Bulk & Tone: normal, no fasciculations. Motor: 5/5 on left UE and LE, right UE, right hip flexion. No pronator drift. Sensation: intact to light touch, cold, pin, vibration and joint position sense.  No extinction to double simultaneous stimulation.  Romberg test negative Deep Tendon Reflexes: brisk +3 on right UE with  +Hoffman sign, +2 on left UE and LE, no ankle clonus on left Plantar responses: downgoing on left Cerebellar: no incoordination on finger to nose testing Gait: slow and cautious s/p right AKA with brace Tremor: none  IMPRESSION: This is a 28 year old right-handed man with a history of chronic pain, cardiac arrest in March 2019 secondary to polysubstance abuse, presenting for evaluation of seizures. He had 2 seizures in May 2019 then another seizure last 08/02/18. He and his mother deny any substance abuse, UDS in Dec 2019 was negative. MRI brain no acute changes, hippocampi symmetric. A 1-hour EEG will be  ordered to further classify seizures. 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 Continue Levetiracetam 500mg  BID. We discussed avoidance of seizure triggers, including missing medication, alcohol/illicit drugs, sleep deprivation. He is significantly depressed from his situation, no suicidal ideation. [employer] information provided today. He will discuss Pain Management referral with his PCP. [place] driving laws were discussed with the patient, and he knows to stop driving after a seizure, until 6 months seizure-free. He will follow-up in 6 months and knows to call for any changes.   Thank you for allowing me to participate in the care of this patient. Please do not hesitate to call for any questions or concerns.   Patrcia Dolly, M.D.  CC: Dr. Janeece Riggers

## 2018-10-19 ENCOUNTER — Other Ambulatory Visit: Payer: Self-pay

## 2018-10-19 ENCOUNTER — Emergency Department (HOSPITAL_COMMUNITY)
Admission: EM | Admit: 2018-10-19 | Discharge: 2018-10-19 | Disposition: A | Discharge status: Home / Self Care | Payer: Medicaid Other | Attending: Emergency Medicine | Admitting: Emergency Medicine

## 2018-10-19 ENCOUNTER — Encounter (HOSPITAL_COMMUNITY): Payer: Self-pay | Admitting: Emergency Medicine

## 2018-10-19 DIAGNOSIS — I10 Essential (primary) hypertension: Secondary | ICD-10-CM | POA: Insufficient documentation

## 2018-10-19 DIAGNOSIS — F17210 Nicotine dependence, cigarettes, uncomplicated: Secondary | ICD-10-CM | POA: Diagnosis not present

## 2018-10-19 DIAGNOSIS — Z79899 Other long term (current) drug therapy: Secondary | ICD-10-CM | POA: Diagnosis not present

## 2018-10-19 DIAGNOSIS — R339 Retention of urine, unspecified: Secondary | ICD-10-CM | POA: Diagnosis not present

## 2018-10-19 DIAGNOSIS — R338 Other retention of urine: Secondary | ICD-10-CM

## 2018-10-19 LAB — CBC
HCT: 48 % (ref 39.0–52.0)
Hemoglobin: 15.8 g/dL (ref 13.0–17.0)
MCH: 30.6 pg (ref 26.0–34.0)
MCHC: 32.9 g/dL (ref 30.0–36.0)
MCV: 92.8 fL (ref 80.0–100.0)
NRBC: 0 % (ref 0.0–0.2)
Platelets: 291 10*3/uL (ref 150–400)
RBC: 5.17 MIL/uL (ref 4.22–5.81)
RDW: 11.5 % (ref 11.5–15.5)
WBC: 14.5 10*3/uL — ABNORMAL HIGH (ref 4.0–10.5)

## 2018-10-19 LAB — COMPREHENSIVE METABOLIC PANEL
ALT: 13 U/L (ref 0–44)
AST: 16 U/L (ref 15–41)
Albumin: 4.6 g/dL (ref 3.5–5.0)
Alkaline Phosphatase: 32 U/L — ABNORMAL LOW (ref 38–126)
Anion gap: 9 (ref 5–15)
BUN: 5 mg/dL — ABNORMAL LOW (ref 6–20)
CO2: 26 mmol/L (ref 22–32)
CREATININE: 1.04 mg/dL (ref 0.61–1.24)
Calcium: 9.3 mg/dL (ref 8.9–10.3)
Chloride: 103 mmol/L (ref 98–111)
GFR calc Af Amer: >60 mL/min (ref >60)
GFR calc non Af Amer: >60 mL/min (ref >60)
Glucose, Bld: 118 mg/dL — ABNORMAL HIGH (ref 70–99)
Potassium: 3.8 mmol/L (ref 3.5–5.1)
Sodium: 138 mmol/L (ref 135–145)
Total Bilirubin: 0.3 mg/dL (ref 0.3–1.2)
Total Protein: 7.2 g/dL (ref 6.5–8.1)

## 2018-10-19 LAB — URINALYSIS, ROUTINE W REFLEX MICROSCOPIC
Bilirubin Urine: NEGATIVE
Glucose, UA: 150 mg/dL — AB
Hgb urine dipstick: NEGATIVE
Ketones, ur: NEGATIVE mg/dL
Leukocytes,Ua: NEGATIVE
Nitrite: NEGATIVE
Protein, ur: NEGATIVE mg/dL
Specific Gravity, Urine: 1.014 (ref 1.005–1.030)
pH: 6 (ref 5.0–8.0)

## 2018-10-19 LAB — LIPASE, BLOOD: Lipase: 30 U/L (ref 11–51)

## 2018-10-19 MED ORDER — SODIUM CHLORIDE 0.9% FLUSH: 3.0000 mL | Freq: Once | INTRAVENOUS | Status: DC

## 2018-10-19 MED ORDER — LIDOCAINE HCL URETHRAL/MUCOSAL 2 % EX GEL
1.0000 "application " | Freq: Once | CUTANEOUS | Status: AC
  Administered 2018-10-19: 1 via TOPICAL
  Filled 2018-10-19: qty 20

## 2018-10-19 NOTE — ED Provider Notes (Signed)
[facility] EMERGENCY DEPARTMENT Provider Note   CSN: [id number] Arrival date & time: 10/19/18  1554    History   Chief Complaint Chief Complaint  Patient presents with  . Abdominal Pain  . Urinary Retention    HPI HARY KIDO is a 28 y.o. male.     Patient is a 28 year old male with a significant history of polysubstance abuse with cardiac arrest last year resulting from overdose resulting with rhabdomyolysis and AKA of the right lower leg with complete renal failure requiring dialysis who has now been off dialysis since April presenting today with symptoms of urinary retention.  He states yesterday when urinating he felt like he was not able to completely empty and his urine smelled foul but denies any pain with urination.  He has been unable to urinate since 9:00 last night.  He has never had symptoms of this before.  He denies fever, nausea or vomiting.  He is complaining of significant pressure in his lower abdomen.  The history is provided by the patient.    Past Medical History:  Diagnosis Date  . Acute renal failure (ARF) (HCC)    History of   . Aspiration pneumonia (HCC)    Hx of  . Asthma   . Bronchitis   . Cardiac arrest (HCC)    hx of  . Cardiogenic shock (HCC)    hx of  . Drug overdose   . Hernia cerebri (HCC)   . History of acute respiratory failure   . Hypertension   . Polysubstance abuse (HCC)   . Rhabdomyolysis     Patient Active Problem List   Diagnosis Date Noted  . 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 Impotence 12/08/2017  . Chronic bilateral low back pain without sciatica 12/08/2017  . Adjustment disorder with mixed anxiety and depressed mood 11/13/2017  . At risk for abuse of opiates 11/13/2017  . Acute kidney failure, unspecified (HCC) 11/02/2017  . Rhabdomyolysis 11/02/2017  . Acute respiratory failure (HCC) 11/02/2017  . Cardiogenic shock (HCC) 10/30/2017  . Cardiac arrest (HCC) 10/30/2017  .  Drug abuse (HCC) 10/30/2017    Past Surgical History:  Procedure Laterality Date  . AMPUTATION Right 11/03/2017   Procedure: KNEE DISARTICULATION;  Surgeon: Renford Dills, MD;  Location: [facility] ORS;  Service: Vascular;  Laterality: Right;  . AMPUTATION Right 11/10/2017   Procedure: AMPUTATION ABOVE KNEE;  Surgeon: Renford Dills, MD;  Location: [facility] ORS;  Service: Vascular;  Laterality: Right;  . ankle/foot surgery with metal plates    . DIALYSIS/PERMA CATHETER INSERTION N/A 11/16/2017   Procedure: DIALYSIS/PERMA CATHETER INSERTION;  Surgeon: Annice Needy, MD;  Location: [facility] INV[facility] CV [facility];  Service: Cardiovascular;  Laterality: N/A;  . DIALYSIS/PERMA CATHETER REMOVAL N/A 12/14/2017   Procedure: DIALYSIS/PERMA CATHETER REMOVAL;  Surgeon: Annice Needy, MD;  Location: [facility] IN[facility];  Service: Cardiovascular;  Laterality: N/A;  . permacath to right side     Scheduled to be removed        Home Medications    Prior to Admission medications   Medication Sig Start Date End Date Taking? Authorizing Provider  amLODipine (NORVASC) 5 MG tablet Take 1 tablet (5 mg total) by mouth at bedtime. 11/27/17   Alford Highland, MD  gabapentin (NEURONTIN) 100 MG capsule Take 2 capsules (200 mg total) by mouth 3 (three) times daily. 12/16/17 01/15/18  Sharman Cheek, MD  ibuprofen (ADVIL,MOTRIN) 800 MG tablet Take 1 tablet (800 mg  total) by mouth 3 (three) times daily. Patient not taking: Reported on 09/20/2018 03/21/18   Triplett, Tammy, PA-C  levETIRAcetam (KEPPRA) 500 MG tablet Take 1 tablet (500 mg total) by mouth 2 (two) times daily. 08/02/18   Seawell, Jaimie A, DO  levETIRAcetam (KEPPRA) 500 MG tablet Take 1 tablet (500 mg total) by mouth 2 (two) times daily. 09/20/18   Van Clines, MD    Family History Family History  Problem Relation Age of Onset  . Varicose Veins Neg Hx     Social History Social History   Tobacco Use  . Smoking status: Current Every Day Smoker    Packs/day:  0.50    Types: Cigarettes  . Smokeless tobacco: Never Used  Substance Use Topics  . Alcohol use: No  . Drug use: Yes    Types: Cocaine, Methamphetamines     Allergies   Codeine   Review of Systems Review of Systems  All other systems reviewed and are negative.    Physical Exam Updated Vital Signs BP (!) 141/85 (BP Location: Right Arm)   Pulse 70   Temp 98 F (36.7 C) (Oral)   Resp 16   SpO2 97%   Physical Exam Vitals signs and nursing note reviewed.  Constitutional:      General: He is not in acute distress.    Appearance: He is well-developed.  HENT:     Head: Normocephalic and atraumatic.  Eyes:     Conjunctiva/sclera: Conjunctivae normal.     Pupils: Pupils are equal, round, and reactive to light.  Neck:     Musculoskeletal: Normal range of motion and neck supple.  Cardiovascular:     Rate and Rhythm: Normal rate and regular rhythm.     Heart sounds: No murmur.  Pulmonary:     Effort: Pulmonary effort is normal. No respiratory distress.     Breath sounds: Normal breath sounds. No wheezing or rales.  Abdominal:     General: There is no distension.     Palpations: Abdomen is soft.     Tenderness: There is abdominal tenderness in the suprapubic area. There is no right CVA tenderness, left CVA tenderness, guarding or rebound.  Genitourinary:    Scrotum/Testes: Normal.        Right: Mass, tenderness or swelling not present.        Left: Mass, tenderness or swelling not present.  Musculoskeletal: Normal range of motion.        General: No tenderness.     Comments: Right AKA  Skin:    General: Skin is warm and dry.     Findings: No erythema or rash.  Neurological:     General: No focal deficit present.     Mental Status: He is alert and oriented to person, place, and time. Mental status is at baseline.  Psychiatric:        Mood and Affect: Mood normal.        Behavior: Behavior normal.        Thought Content: Thought content normal.      ED  Treatments / Results  Labs (all labs ordered are listed, but only abnormal results are displayed) Labs Reviewed  COMPREHENSIVE METABOLIC PANEL - Abnormal; Notable for the following components:      Result Value   Glucose, Bld 118 (*)    BUN 5 (*)    Alkaline Phosphatase 32 (*)    All other components within normal limits  CBC - Abnormal; Notable for the following components:  WBC 14.5 (*)    All other components within normal limits  URINALYSIS, ROUTINE W REFLEX MICROSCOPIC - Abnormal; Notable for the following components:   Glucose, UA 150 (*)    All other components within normal limits  URINE CULTURE  LIPASE, BLOOD    EKG None  Radiology No results found.  Procedures Procedures (including critical care time)  Medications Ordered in ED Medications  sodium chloride flush (NS) 0.9 % injection 3 mL (3 mLs Intravenous Not Given 10/19/18 1655)  lidocaine (XYLOCAINE) 2 % jelly 1 application (has no administration in time range)     Initial Impression / Assessment and Plan / ED Course  I have reviewed the triage vital signs and the nursing notes.  Pertinent labs & imaging results that were available during my care of the patient were reviewed by me and considered in my medical decision making (see chart for details).       Patient presenting today with symptoms of urinary retention without prior episodes.  He did complain of foul-smelling urine but no dysuria prior to retention starting.  Patient states he thinks it is related to his antidepressant medication.  He denies any systemic infectious symptoms but states his whole body feels like it is quivering.  Patient has no evidence of Fournier's gangrene or cellulitis in the groin area.  He has suprapubic discomfort.  Bedside bladder scan showed greater than 700.  Will insert a Foley catheter.  CBC with a leukocytosis of 14,000, CMP, UA and urine culture pending  6:10 PM On further history patient has started using drugs  again and used cocaine last night which could be the result of his urinary retention.  Patient's CMP with normal renal function and UA without signs of infection.  Bladder was drained and Foley catheter was removed.  Patient encouraged to abstain from drugs.  Final Clinical Impressions(s) / ED Diagnoses   Final diagnoses:  Acute urinary retention    ED Discharge Orders    None       Gwyneth Sprout, MD 10/19/18 [phone]

## 2018-10-19 NOTE — ED Notes (Signed)
Removed foley catheter per verbal order from MD. Pt tolerated well. Pt's bladder scan revealed 76mL left in bladder. output in foley catheter.

## 2018-10-19 NOTE — ED Triage Notes (Signed)
Pt c/o abdominal pain and urinary retention. Pt reports the last time he voided was yesterday.

## 2018-10-19 NOTE — ED Notes (Signed)
Bladder scan completed, 750 ml resulted.

## 2018-10-21 LAB — URINE CULTURE: Culture: NO GROWTH

## 2018-11-09 ENCOUNTER — Ambulatory Visit: Payer: Medicaid Other | Admitting: Neurology

## 2018-11-13 DIAGNOSIS — F411 Generalized anxiety disorder: Secondary | ICD-10-CM | POA: Insufficient documentation

## 2018-11-13 DIAGNOSIS — F321 Major depressive disorder, single episode, moderate: Secondary | ICD-10-CM | POA: Insufficient documentation

## 2018-12-02 ENCOUNTER — Other Ambulatory Visit (INDEPENDENT_AMBULATORY_CARE_PROVIDER_SITE_OTHER): Payer: Self-pay | Admitting: Nurse Practitioner

## 2018-12-02 ENCOUNTER — Telehealth (INDEPENDENT_AMBULATORY_CARE_PROVIDER_SITE_OTHER): Payer: Self-pay | Admitting: Vascular Surgery

## 2018-12-02 DIAGNOSIS — M79672 Pain in left foot: Secondary | ICD-10-CM

## 2018-12-06 ENCOUNTER — Other Ambulatory Visit: Payer: Self-pay

## 2018-12-06 ENCOUNTER — Ambulatory Visit (INDEPENDENT_AMBULATORY_CARE_PROVIDER_SITE_OTHER): Payer: Medicaid Other

## 2018-12-06 ENCOUNTER — Ambulatory Visit (INDEPENDENT_AMBULATORY_CARE_PROVIDER_SITE_OTHER): Payer: Medicaid Other | Admitting: Nurse Practitioner

## 2018-12-06 ENCOUNTER — Encounter (INDEPENDENT_AMBULATORY_CARE_PROVIDER_SITE_OTHER): Payer: Self-pay | Admitting: Nurse Practitioner

## 2018-12-06 VITALS — BP 130/87 | HR 76 | Resp 16 | Ht 70.0 in | Wt 168.0 lb

## 2018-12-06 DIAGNOSIS — Z89611 Acquired absence of right leg above knee: Secondary | ICD-10-CM

## 2018-12-06 DIAGNOSIS — F4323 Adjustment disorder with mixed anxiety and depressed mood: Secondary | ICD-10-CM | POA: Diagnosis not present

## 2018-12-06 DIAGNOSIS — M79605 Pain in left leg: Secondary | ICD-10-CM | POA: Diagnosis not present

## 2018-12-06 DIAGNOSIS — M79672 Pain in left foot: Secondary | ICD-10-CM

## 2018-12-06 DIAGNOSIS — Z79899 Other long term (current) drug therapy: Secondary | ICD-10-CM | POA: Diagnosis not present

## 2018-12-06 MED ORDER — GABAPENTIN 300 MG PO CAPS: 300.0000 mg | ORAL_CAPSULE | Freq: Three times a day (TID) | ORAL | 3 refills | Status: DC

## 2018-12-06 NOTE — Progress Notes (Signed)
SUBJECTIVE:  Patient ID: Joshua [name], male    DOB: 07/24/1991, 28 y.o.   MRN: [id number] Chief Complaint  Patient presents with  . Follow-up    abi ultrasound for leg pain    HPI  Joshua [name] is a 28 y.o. male that presents today for complaints of left leg pain.  The patient describes the pain as variable and happens when he walks as well as when he rest.  He states that the pain is located right at the area of his posterior shin.  He states that sometimes when he is walking it feels as if it is going to snap.  There is extreme pressure and tension in that area.  He also endorses a litany of other issues today, such as issues with his prosthetic fit which causes him to favor his left leg versus right.  He also complains of anxiety and depression.  He complains of issues with hypertension although his blood pressure is in the 130 systolic today.  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  He also endorses pain within his right above-knee amputation.  He states that there is an ache and tingling.  He states that it feels as if there is movement in his leg but in the portion that is not there.  He states that this is painful for him as well.  The patient denies any fever, chills, nausea, vomiting or diarrhea.  He denies any chest pain or shortness of breath.  He denies any TIA-like symptoms.  The patient underwent noninvasive studies today with a left ABI of 1.01 with TBI's of 0.62.  His tibial artery waveforms were triphasic.  His left digit waveforms were strong.  Past Medical History:  Diagnosis Date  . Acute renal failure (ARF) (HCC)    History of   . Aspiration pneumonia (HCC)    Hx of  . Asthma   . Bronchitis   . Cardiac arrest (HCC)    hx of  . Cardiogenic shock (HCC)    hx of  . Drug overdose   . Hernia cerebri (HCC)   . History of acute respiratory failure   . Hypertension   . Polysubstance abuse (HCC)    . Rhabdomyolysis     Past Surgical History:  Procedure Laterality Date  . AMPUTATION Right 11/03/2017   Procedure: KNEE DISARTICULATION;  Surgeon: Renford DillsSchnier, Gregory G, MD;  Location: [facility] ORS;  Service: Vascular;  Laterality: Right;  . AMPUTATION Right 11/10/2017   Procedure: AMPUTATION ABOVE KNEE;  Surgeon: Renford DillsSchnier, Gregory G, MD;  Location: [facility];  Service: Vascular;  Laterality: Right;  . ankle/foot surgery with metal plates    . DIALYSIS/PERMA CATHETER INSERTION N/A 11/16/2017   Procedure: DIALYSIS/PERMA CATHETER INSERTION;  Surgeon: Annice Needyew, Jason S, MD;  Location: [facility];  Service: Cardiovascular;  Laterality: N/A;  . DIALYSIS/PERMA CATHETER REMOVAL N/A 12/14/2017   Procedure: DIALYSIS/PERMA CATHETER REMOVAL;  Surgeon: Annice Needyew, Jason S, MD;  Location: [facility];  Service: Cardiovascular;  Laterality: N/A;  . permacath to right side     Scheduled to be removed    Social History   Socioeconomic History  . Marital status: Legally Separated    Spouse name: Not on file  . Number of children: Not on file  . Years of education: Not on file  . Highest education level: Not on file  Occupational History  . Not on  file  Social Needs  . Financial resource strain: Not on file  . Food insecurity:    Worry: Not on file    Inability: Not on file  . Transportation needs:    Medical: Not on file    Non-medical: Not on file  Tobacco Use  . Smoking status: Current Every Day Smoker    Packs/day: 0.50    Types: Cigarettes  . Smokeless tobacco: Never Used  Substance and Sexual Activity  . Alcohol use: No  . Drug use: Yes    Types: Cocaine, Methamphetamines  . Sexual activity: Not on file  Lifestyle  . Physical activity:    Days per week: Not on file    Minutes per session: Not on file  . Stress: Not on file  Relationships  . Social connections:    Talks on phone: Not on file    Gets together: Not on file    Attends religious service: Not on file    Active  member of club or organization: Not on file    Attends meetings of clubs or organizations: Not on file    Relationship status: Not on file  . Intimate partner violence:    Fear of current or ex partner: Not on file    Emotionally abused: Not on file    Physically abused: Not on file    Forced sexual activity: Not on file  Other Topics Concern  . Not on file  Social History Narrative   Pt is right handed   Lives in single story home with his mother   11th grade education   Last employment was at [employer] in [place]    Family History  Problem Relation Age of Onset  . Varicose Veins Neg Hx     Allergies  Allergen Reactions  . Codeine Other (See Comments)    seizures     Review of Systems   Review of Systems: Negative Unless Checked Constitutional: Weight loss  Fever  Chills Cardiac: Chest pain    Atrial Fibrillation  Palpitations   Shortness of breath when laying flat   Shortness of breath with exertion. Shortness of breath at rest Vascular:  Pain in legs with walking   Pain in legs with standing Pain in legs when laying flat   Claudication    Pain in feet when laying flat    History of DVT   Phlebitis   Swelling in legs   Varicose veins   Non-healing ulcers Pulmonary:   Uses home oxygen   Productive cough   Hemoptysis   Wheeze  COPD   Asthma Neurologic:  Dizziness   Seizures  Blackouts History of stroke   History of TIA  Aphasia   Temporary Blindness   Weakness or numbness in arm   Weakness or numbness in leg Musculoskeletal:   Joint swelling   Joint pain   Low back pain   History of Knee Replacement Arthritis back Surgeries   Spinal Stenosis    Hematologic:  Easy bruising  Easy bleeding   Hypercoagulable state   Anemic Gastrointestinal:  Diarrhea   Vomiting  Gastroesophageal reflux/heartburn   Difficulty swallowing. Abdominal pain Genitourinary:  Chronic kidney  disease   Difficult urination  Anuric   Blood in urine Frequent urination  Burning with urination   Hematuria Skin:  Rashes   Ulcers Wounds Psychological:  History of anxiety    History of major depression   Memory Difficulties      OBJECTIVE:   Physical Exam  BP 130/87 (BP Location: Right Arm)   Pulse 76   Resp 16   Ht 5\' 10"  (1.778 m)   Wt 168 lb (76.2 kg)   BMI 24.11 kg/m   Gen: WD/WN, NAD Head: [place]/AT, No temporalis wasting.  Ear/Nose/Throat: Hearing grossly intact, nares w/o erythema or drainage Eyes: PER, EOMI, sclera nonicteric.  Neck: Supple, no masses.  No JVD.  Pulmonary:  Good air movement, no use of accessory muscles.  Cardiac: RRR Vascular:  Vessel Right Left  Radial Palpable Palpable  Dorsalis Pedis  Palpable  Posterior Tibial  Palpable   Gastrointestinal: soft, non-distended. No guarding/no peritoneal signs.  Musculoskeletal: M/S 5/5 throughout.    Right above-knee amputation. Neurologic: Pain and light touch intact in extremities.  Symmetrical.  Speech is fluent. Motor exam as listed above. Psychiatric: Judgment intact, Mood & affect appropriate for pt's clinical situation. Dermatologic: No Venous rashes. No Ulcers Noted.  No changes consistent with cellulitis. Lymph : No Cervical lymphadenopathy, no lichenification or skin changes of chronic lymphedema.       ASSESSMENT AND PLAN:  1. S/P AKA (above knee amputation), right (HCC) The patient's prosthesis is giving him significant difficulties.  I have advised him to contact the manufacturer to see if there are some adjustments or modifications that can be made.  He also complains of significant phantom pain.  He states that he has had gabapentin before in the past and it helps his pain.  We will place him on gabapentin for phantom limb pain.  He also has a multitude of other complaints of pain.  Given his history of opioid addiction, I feel it is prudent to refer him to a pain  clinic in order to provide the best possible options for pain management.  - gabapentin (NEURONTIN) 300 MG capsule; Take 1 capsule (300 mg total) by mouth 3 (three) times daily.  Dispense: 90 capsule; Refill: 3 - Ambulatory referral to Pain Clinic  2. Leg pain, anterior, left The pain that he currently has is not vascular in nature.  However, it is likely due to the fact that his current prosthesis is not fitting well and giving him significant issues.  I believe that he is bearing most of his weight on his left leg.  The pain and sensation that he is describing is typically described as shin splints.  I have advised him that he can use ice in the area when the pain begins as well as NSAIDs or Tylenol for pain control.  3. Adjustment disorder with mixed anxiety and depressed mood Patient states that he is having issues with anxiety and depression.  He has a multitude of other issues that cannot be addressed by vascular surgery.  He was advised to seek guidance through his primary care provider, however he states that he does not like his current provider and would like a new one.  We have offered to place a referral for a new primary care provider. - Ambulatory referral to [facility]   Current Outpatient Medications on File Prior to Visit  Medication Sig Dispense Refill  . levETIRAcetam (KEPPRA) 500 MG tablet Take 1 tablet (500 mg total) by mouth 2 (two) times daily. 60 tablet 11  . sertraline (ZOLOFT) 50 MG tablet Take 150 mg by mouth daily.    Marland Kitchen amLODipine (NORVASC) 5 MG tablet Take 1 tablet (5 mg total) by mouth at bedtime. (Patient not taking: Reported on 10/19/2018) 30 tablet 0  . ibuprofen (ADVIL,MOTRIN) 800 MG tablet Take 1 tablet (  800 mg total) by mouth 3 (three) times daily. (Patient not taking: Reported on 09/20/2018) 21 tablet 0  . levETIRAcetam (KEPPRA) 500 MG tablet Take 1 tablet (500 mg total) by mouth 2 (two) times daily. (Patient not taking: Reported on 10/19/2018) 60 tablet 0    No current facility-administered medications on file prior to visit.     There are no Patient Instructions on file for this visit. No follow-ups on file.   Georgiana Spinner, NP  This note was completed with Office manager.  Any errors are purely unintentional.

## 2018-12-07 ENCOUNTER — Encounter (INDEPENDENT_AMBULATORY_CARE_PROVIDER_SITE_OTHER): Payer: Self-pay | Admitting: Nurse Practitioner

## 2019-01-11 ENCOUNTER — Other Ambulatory Visit: Payer: Self-pay

## 2019-01-11 ENCOUNTER — Encounter: Payer: Self-pay | Admitting: Family Medicine

## 2019-01-11 ENCOUNTER — Ambulatory Visit (INDEPENDENT_AMBULATORY_CARE_PROVIDER_SITE_OTHER): Payer: Medicaid Other | Admitting: Family Medicine

## 2019-01-11 DIAGNOSIS — F4323 Adjustment disorder with mixed anxiety and depressed mood: Secondary | ICD-10-CM

## 2019-01-11 DIAGNOSIS — G4700 Insomnia, unspecified: Secondary | ICD-10-CM | POA: Diagnosis not present

## 2019-01-11 DIAGNOSIS — F1911 Other psychoactive substance abuse, in remission: Secondary | ICD-10-CM | POA: Diagnosis not present

## 2019-01-11 MED ORDER — TRAZODONE HCL 50 MG PO TABS: 50.0000 mg | ORAL_TABLET | Freq: Every evening | ORAL | 3 refills | Status: DC | PRN

## 2019-01-11 MED ORDER — BUSPIRONE HCL 10 MG PO TABS: 10.0000 mg | ORAL_TABLET | Freq: Two times a day (BID) | ORAL | 0 refills | Status: DC

## 2019-01-11 NOTE — Progress Notes (Signed)
Virtual Visit via Telephone Note  I connected with Joshua [name] on 01/11/19 at  3:10 PM EDT by telephone and verified that I am speaking with the correct person using two identifiers.  Location: Patient: Located at home during today's encounter. Provider: Located at home during today's encounter.   I discussed the limitations, risks, security and privacy concerns of performing an evaluation and management service by telephone and the availability of in person appointments. I also discussed with the patient that there may be a patient responsible charge related to this service. The patient expressed understanding and agreed to proceed.   History of Present Illness: Joshua [name] is wishing to establish care today. 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  Brief history  Joshua [name] reports over 1 year ago, during a period of depression and following recent separation from his wife, he purchase cocaine and fentanyl. He overdosed and subsequently went into cardiac arrest. At some point while under the influence of drugs, patient fell in the hotel room landing with his right leg bent underneath his body in an awkward position. Due to the length of time patient remained in this position, vascular flow was obstructed, ischemia developed resulted in a right BKA. He reports that he has remained drug free since this incident occurred. He is followed by vascular surgery, who referred him here for to establish with a PCP and management of depression and anxiety.   Depression and Anxiety  Reason for visit today is to address depression and anxiety. Patient reports he was previously receiving care from a psychiatrist. Previously prescribed Zoloft. Stopped Zoloft, did not like the may medication caused him to feel. PHQ-9 indicates the presence of suicidal ideations. Patient  admits to having feelings and thoughts of being better off dead, although denies a plan. He further states " I thank God for my family and that I am alive".  Current stressors include financial stress as he is applied for disability previously denied however is currently under appeal and feels that this time his disability will go through.  He is currently without health insurance or any source of income.  He relies solely on his aunt and uncle for support. He and his wife remained separated. He feels that he and wife will likely divorce.  He is also frustrated as she lives in the home that they are not together and he is unable to get the house back.  He has applied disability and it has not been approved as of yet."  Endorses problems with sleeping which range from inability to sleep to prolonged periods of sleep. He reports being prescribed Xanax at some point for anxiety which he thought helped the most with anxious symptoms.  GAD 7 : Generalized Anxiety Score 01/11/2019  Nervous, Anxious, on Edge 1  Control/stop worrying 1  Worry too much - different things 2  Trouble relaxing 1  Restless 0  Easily annoyed or irritable 1  Afraid - awful might happen 1  Total GAD 7 Score 7     Depression screen PHQ 2/9 01/11/2019  Decreased Interest 2  Down, Depressed, Hopeless 2  PHQ - 2 Score 4  Altered sleeping 2  Tired, decreased energy 2  Change in appetite 3  Feeling bad or failure about yourself  1  Trouble concentrating 2  Moving slowly or fidgety/restless 0  Suicidal thoughts 1  PHQ-9 Score 15  Assessment and Plan: 1. Adjustment disorder with mixed anxiety and depressed mood Will trial Buspirone 10 mg BID for anxiety and Trazodone 50 mg at bedtime for sleep. Suspect patient likely an undiagnosed underlying mental health disorder and [facility]   - Ambulatory referral to Behavioral Health  2. Insomnia, unspecified type - Trial Trazodone 50 mg once daily at bedtime     Follow Up Instructions: CPE in July fasting labs  I discussed the assessment and treatment plan with the patient. The patient was provided an opportunity to ask questions and all were answered. The patient agreed with the plan and demonstrated an understanding of the instructions.   The patient was advised to call back or seek an in-person evaluation if the symptoms worsen or if the condition fails to improve as anticipated.  I provided 30 minutes of non-face-to-face time during this encounter.   Joaquin CourtsKimberly Ryott Rafferty, FNP

## 2019-01-11 NOTE — Progress Notes (Deleted)
Called patient to initiate their telephone visit with provider Joaquin Courts, FNP-C. Verified date of birth. Was referred by Vascular Surgeon due to issues with depression/anxiety. KWalker, CMA.

## 2019-01-13 ENCOUNTER — Ambulatory Visit (INDEPENDENT_AMBULATORY_CARE_PROVIDER_SITE_OTHER): Payer: Medicaid Other | Admitting: Family Medicine

## 2019-01-13 ENCOUNTER — Encounter: Payer: Self-pay | Admitting: Family Medicine

## 2019-01-13 ENCOUNTER — Other Ambulatory Visit: Payer: Self-pay

## 2019-01-13 DIAGNOSIS — J302 Other seasonal allergic rhinitis: Secondary | ICD-10-CM

## 2019-01-13 DIAGNOSIS — K047 Periapical abscess without sinus: Secondary | ICD-10-CM

## 2019-01-13 MED ORDER — LIDOCAINE VISCOUS HCL 2 % MT SOLN: 15.0000 mL | OROMUCOSAL | 0 refills | Status: DC | PRN

## 2019-01-13 MED ORDER — CLINDAMYCIN HCL 300 MG PO CAPS: 300.0000 mg | ORAL_CAPSULE | Freq: Three times a day (TID) | ORAL | 0 refills | Status: AC

## 2019-01-13 NOTE — Progress Notes (Signed)
Virtual Visit via Telephone Note  I connected with Joshua [name] on 01/13/19 at  9:50 AM EDT by telephone and verified that I am speaking with the correct person using two identifiers.  Location: Patient: Located at home during today's encounter  Provider: Located at primary care office    I discussed the limitations, risks, security and privacy concerns of performing an evaluation and management service by telephone and the availability of in person appointments. I also discussed with the patient that there may be a patient responsible charge related to this service. The patient expressed understanding and agreed to proceed.   History of Present Illness: Dental pain upper right molar. Feels right side of face is swollen. Molar is broken off. He endorses chronic dental problems. He is uninsured and unable to follow-up with dentist. He has no allergies to antibiotics. Endorses seasonal allergies and would like to start allergy injections. He is without insurance and is unable to pay for allergy referral out of pocket. He initially said he was taken any antihistamine   Assessment and Plan: 1. Tooth infection -Start Clindamycin 300 TID x 7 days  -Lidocaine viscous prescribed PRN for pain   2. Seasonal allergies -Trial Claritin or Cetirizine OTC daily therapy for management of allergy symptoms. -Once health insurance is approved, I will refer to Asthma and Allergy    Patient was advised if medication doesn't improve dental pain, it would be warranted to follow-up with a dentist.  Follow Up Instructions: RTC -July appt.   I discussed the assessment and treatment plan with the patient. The patient was provided an opportunity to ask questions and all were answered. The patient agreed with the plan and demonstrated an understanding of the instructions.   The patient was advised to call back or seek an in-person evaluation if the symptoms worsen or if the condition fails to improve as  anticipated.  I provided 15 minutes of non-face-to-face time during this encounter.   Joaquin Courts, FNP-C

## 2019-01-13 NOTE — Progress Notes (Deleted)
Called patient to initiate their telephone visit with provider Joaquin Courts, FNP-C. Verified date of birth. Patient states that his wisdom teeth are infected. Is unable to get in to see a dentist the moment due to pandemic. KWalker, CMA.

## 2019-01-18 ENCOUNTER — Telehealth (INDEPENDENT_AMBULATORY_CARE_PROVIDER_SITE_OTHER): Payer: Self-pay | Admitting: Vascular Surgery

## 2019-01-18 NOTE — Telephone Encounter (Signed)
Advised patient to have Medical examiner send over Medical Record request. Patient verbalized understanding and I gave the patient our fax number. AS, CMA

## 2019-01-24 ENCOUNTER — Telehealth (INDEPENDENT_AMBULATORY_CARE_PROVIDER_SITE_OTHER): Payer: Self-pay | Admitting: Vascular Surgery

## 2019-01-24 NOTE — Telephone Encounter (Signed)
I inform the patient that his medical record request have been receive and I will be working on the request

## 2019-01-25 NOTE — Telephone Encounter (Signed)
Chart opened in error  This encounter was created in error - please disregard. 

## 2019-01-26 ENCOUNTER — Telehealth: Payer: Self-pay | Admitting: Neurology

## 2019-01-26 NOTE — Telephone Encounter (Signed)
Molli Knock I sent Darl Pikes (EEG Tech) message to call patient and schedule. Thanks

## 2019-01-26 NOTE — Telephone Encounter (Signed)
Who is scheduling the EEG's? His was ordered in Jan 27th

## 2019-01-26 NOTE — Telephone Encounter (Signed)
Patient called regarding him having an EEG. He said he has not heard from anyone to have it set up. Please Call. Thanks

## 2019-01-31 ENCOUNTER — Telehealth (INDEPENDENT_AMBULATORY_CARE_PROVIDER_SITE_OTHER): Payer: Medicaid Other | Admitting: Neurology

## 2019-01-31 ENCOUNTER — Other Ambulatory Visit: Payer: Self-pay

## 2019-01-31 VITALS — Ht 71.0 in | Wt 170.0 lb

## 2019-01-31 DIAGNOSIS — G40009 Localization-related (focal) (partial) idiopathic epilepsy and epileptic syndromes with seizures of localized onset, not intractable, without status epilepticus: Secondary | ICD-10-CM | POA: Diagnosis not present

## 2019-01-31 MED ORDER — LEVETIRACETAM 500 MG PO TABS: 500.0000 mg | ORAL_TABLET | Freq: Two times a day (BID) | ORAL | 3 refills | Status: DC

## 2019-01-31 NOTE — Progress Notes (Signed)
Virtual Visit via Video Note The purpose of this virtual visit is to provide medical care while limiting exposure to the novel coronavirus.    Consent was obtained for video visit:  Yes.   Answered questions that patient had about telehealth interaction:  Yes.   I discussed the limitations, risks, security and privacy concerns of performing an evaluation and management service by telemedicine. I also discussed with the patient that there may be a patient responsible charge related to this service. The patient expressed understanding and agreed to proceed.  Pt location: Home Physician Location: office Name of referring provider:  Scot Jun, FNP I connected with Joshua [name] at patients initiation/request on 01/31/2019 at  1:30 PM EDT by video enabled telemedicine application and verified that I am speaking with the correct person using two identifiers. Pt MRN:  [id number] Pt DOB:  23-Apr-1991 Video Participants:  Joshua [name]   History of Present Illness: The patient was last seen in January 2020 for seizures. Seizures started after his cardiac arrest in March 2019. He had 2 convulsions in May 2019, then another in December 2019. He was started on Levetiracetam 500mg  BID and denies any seizures since then. He denies any staring/unresponsive episodes, gaps in time, olfactory/gustatory hallucinations, focal numbness/tingling/weakness, myoclonic jerks. No side effects on Levetiracetam. He now lives with his aunt, he was having a hard time with his mother. He has been started on Buspar and Trazodone, he states his mood is okay, he still gets depressed real bad sometimes. He has occasional sharp pains in his head. He has a hard time remembering things. He is scheduled for an EEG this Wednesday.  History on Initial Assessment 09/20/2018: This is a 28 year old right-handed man with a history of cardiac arrest in March 2019 secondary to polysubstance abuse, presenting for evaluation of seizures. No  record of seizures during his hospitalization in March 2019. He and his mother report the first seizure occurred in May 2019 when he had 2 convulsions back to back. There was no prior warning, he recalls feeling stressed out, severely depressed and anxious for a few days prior. He was sitting on a chair when he had the seizures. He awoke to EMS around him, no tongue bite or incontinence, and was brought to [facility] where he was started on Keppra. He did not think he needed it and stopped taking it. He recalls some constipation while taking it. He had another seizure on 08/02/18 while asleep that was witnessed by his mother, his mother reports generalized body jerking with his eyes rolled back for 10 minutes. He recalls feeling depressed and anxious that day, not feeling right. He told family he would try to go back to sleep then had the convulsion with tongue bite. He was confused on EMS arrival. UDS at that time negative. He was discharged home on Keppra 500mg  BID and again reports some constipation. He saw his PCP who ordered an MRI brain without contrast done 09/06/2018 which I personally reviewed, no acute changes, hippocampi symmetric. There was note of bilateral basal ganglia mild magnetic susceptibility effect.   He was admitted for cardiac arrest secondary to substance abuse in March 2019. 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 He was also noted to have chronic pain on Neurontin, oxycodone was discontinued. He has not been able to get Neurontin or Xanax, which he states really helped a  lot. He is very depressed in the office today with poor eye contact. He has not started Zoloft yet. He states he hurts all the time, and his brace makes his leg hurt more. He has headaches, neck and back pain, and poor sleep. He denies any dizziness, diplopia, dysarthria/dysphagia, bladder  dysfunction. He denies any olfactory/gustatory hallucinations, rising epigastric sensation, focal numbness/tingling/weakness, myoclonic jerks.   Epilepsy Risk Factors:  History of hypoxic injury in March 2019. His maternal grandfather had seizures with leukemia. Otherwise he had a normal birth and early development.  There is no history of febrile convulsions, CNS infections such as meningitis/encephalitis,   Current Outpatient Medications on File Prior to Visit  Medication Sig Dispense Refill  . busPIRone (BUSPAR) 10 MG tablet Take 1 tablet (10 mg total) by mouth 2 (two) times daily for 30 days. 60 tablet 0  . gabapentin (NEURONTIN) 300 MG capsule Take 1 capsule (300 mg total) by mouth 3 (three) times daily. 90 capsule 3  . traZODone (DESYREL) 50 MG tablet Take 1 tablet (50 mg total) by mouth at bedtime as needed for sleep. 30 tablet 3   No current facility-administered medications on file prior to visit.      Observations/Objective:   Vitals:   01/31/19 0950  Weight: 170 lb (77.1 kg)  Height: 5\' 11"  (1.803 m)   GEN:  The patient appears stated age and is in NAD. Affect improved compared to prior, more animated today.  Neurological examination: Patient is awake, alert, oriented x 3. No aphasia or dysarthria. Intact fluency and comprehension. Remote and recent memory intact. Able to name and repeat. Cranial nerves: Extraocular movements intact with no nystagmus. No facial asymmetry. Motor: moves all extremities symmetrically, at least anti-gravity x 4. No incoordination on finger to nose testing. Gait: narrow-based and steady, able to tandem walk adequately. Negative Romberg test.  Assessment and Plan:   This is a 28 yo RH man with a history of chronic pain, cardiac arrest in March 2019 secondary to polysubstance abuse, with subsequent seizures. He had 2 seizures in May 2019 then another seizure last 08/02/18. He denies any further substance abuse, UDS in Dec 2019 was negative. MRI brain no  acute changes, hippocampi symmetric. He is scheduled for an EEG this week. 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 He is also reporting memory issues, memory testing will be done on a separate date. He is aware of [place] driving laws to stop driving after a seizure, until 6 months seizure-free. He will follow-up in 6 months and knows to call for any changes.    Follow Up Instructions:   -I discussed the assessment and treatment plan with the patient. The patient was provided an opportunity to ask questions and all were answered. The patient agreed with the plan and demonstrated an understanding of the instructions.   The patient was advised to call back or seek an in-person evaluation if the symptoms worsen or if the condition fails to improve as anticipated.   Van ClinesKaren M Aquino, MD

## 2019-02-02 ENCOUNTER — Ambulatory Visit (INDEPENDENT_AMBULATORY_CARE_PROVIDER_SITE_OTHER): Payer: Medicaid Other | Admitting: Neurology

## 2019-02-02 ENCOUNTER — Other Ambulatory Visit: Payer: Self-pay

## 2019-02-02 DIAGNOSIS — F322 Major depressive disorder, single episode, severe without psychotic features: Secondary | ICD-10-CM | POA: Diagnosis not present

## 2019-02-02 DIAGNOSIS — G40009 Localization-related (focal) (partial) idiopathic epilepsy and epileptic syndromes with seizures of localized onset, not intractable, without status epilepticus: Secondary | ICD-10-CM | POA: Diagnosis not present

## 2019-02-03 ENCOUNTER — Telehealth: Payer: Self-pay | Admitting: Neurology

## 2019-02-03 NOTE — Telephone Encounter (Signed)
Spoke with pt he was informed that we do not have his results yet, he asked for [place] to fax his results to [place] at [place] [phone]

## 2019-02-03 NOTE — Telephone Encounter (Signed)
Pt called back no answer voice mail left for him to call back

## 2019-02-03 NOTE — Telephone Encounter (Signed)
Pt wants to get he results of the memory test and EEG he had done. He also states that he wants the results sent to the medical  examiner in [place] the fax number is [phone]

## 2019-02-04 ENCOUNTER — Telehealth: Payer: Self-pay

## 2019-02-04 ENCOUNTER — Telehealth: Payer: Self-pay | Admitting: Neurology

## 2019-02-04 NOTE — Telephone Encounter (Signed)
Pt called back he was given his EEG results EEG was normal, however it is not like a pregnancy test that is positive or negative. Continue Keppra. Pt asking about his MOCA, Dr Delice Lesch was asked pt informed this is to give [place] a base lind on his memory, pt asking what EEG covers pt informed that EEG is a snap shot of what is going on now and that his was normal , pt continue to talk about needing to get his medicaid pt informed that information he requested what sent to [employer] [phone]

## 2019-02-04 NOTE — Telephone Encounter (Signed)
Pt called stating that he received a call from about his test results. Pls call him again.

## 2019-02-04 NOTE — Procedures (Signed)
ELECTROENCEPHALOGRAM REPORT  Date of Study: 02/02/2019  Patient's Name: PRYCE FOLTS MRN: [id number] Date of Birth: 11-16-1990  Referring Provider: Dr. Ellouise Newer  Clinical History: This is a 28 year old man with a history of cardiac arrest secondary to substance abuse with subsequent seizures. EEG for classification.   Medications: Keppra, Neurontin  Technical Summary: A multichannel digital 1-hour EEG recording measured by the international 10-20 system with electrodes applied with paste and impedances below 5000 ohms performed in our laboratory with EKG monitoring in an awake and asleep patient.  Hyperventilation was not performed. Photic stimulation was performed.  The digital EEG was referentially recorded, reformatted, and digitally filtered in a variety of bipolar and referential montages for optimal display.    Description: The patient is awake and asleep during the recording.  During maximal wakefulness, there is a symmetric, medium voltage 9 Hz posterior dominant rhythm that attenuates with eye opening.  The record is symmetric.  During drowsiness and sleep, there is an increase in theta slowing of the background.  Vertex waves and symmetric sleep spindles were seen.  Photic stimulation did not elicit any abnormalities.  There were no epileptiform discharges or electrographic seizures seen.    EKG lead was unremarkable.  Impression: This 1-hour awake and asleep EEG is normal.    Clinical Correlation: A normal EEG does not exclude a clinical diagnosis of epilepsy.  If further clinical questions remain, prolonged EEG may be helpful.  Clinical correlation is advised.   Ellouise Newer, M.D.

## 2019-02-28 ENCOUNTER — Telehealth: Payer: Self-pay | Admitting: Family Medicine

## 2019-02-28 NOTE — Telephone Encounter (Signed)
1) Medication(s) Requested (by name):busPIRone (BUSPAR) 10 MG tablet [[id number]] ENDED   2) Pharmacy of Choice:[facility] in [place]  3) Special Requests:   Approved medications will be sent to the pharmacy, we will reach out if there is an issue.  Requests made after 3pm may not be addressed until the following business day!  If a patient is unsure of the name of the medication(s) please note and ask patient to call back when they are able to provide all info, do not send to responsible party until all information is available!

## 2019-03-01 MED ORDER — BUSPIRONE HCL 10 MG PO TABS: 10.0000 mg | ORAL_TABLET | Freq: Two times a day (BID) | ORAL | 0 refills | Status: DC

## 2019-03-01 NOTE — Telephone Encounter (Signed)
1 month refill sent to requested pharmacy.  Please remind patient to complete new patient paperwork for [facility] & return it to their prior to running out of medication. Patient was referred to [facility] for management of his depression/anxiety. Per referral notes it was emailed to him on 02/16/2019.

## 2019-03-01 NOTE — Telephone Encounter (Signed)
Please look at last note

## 2019-03-02 NOTE — Telephone Encounter (Signed)
Called the patient and he was informed that we would not send anymore medications and he need to get to [place]

## 2019-03-08 ENCOUNTER — Ambulatory Visit: Payer: Medicaid Other | Admitting: Family Medicine

## 2019-03-08 ENCOUNTER — Other Ambulatory Visit: Payer: Medicaid Other

## 2019-03-11 IMAGING — CT CT NECK W/ CM
3 of 5 series · 10 of 33 positions shown, 12 images · IV contrast (iopamidol)
Comparison: None.

CLINICAL DATA: Oral pain and left neck mass.

EXAM:
CT NECK WITH CONTRAST
TECHNIQUE: Multidetector CT imaging of the neck was performed using the
standard protocol following the bolus administration of intravenous
contrast.
CONTRAST:  75mL KFFDFG-XOO IOPAMIDOL (KFFDFG-XOO) INJECTION 61%

[Series 6: sag neck · sagittal · 0.44mm/px · 5 of 69 slices shown, 6 images]
[im 23/69  bone]
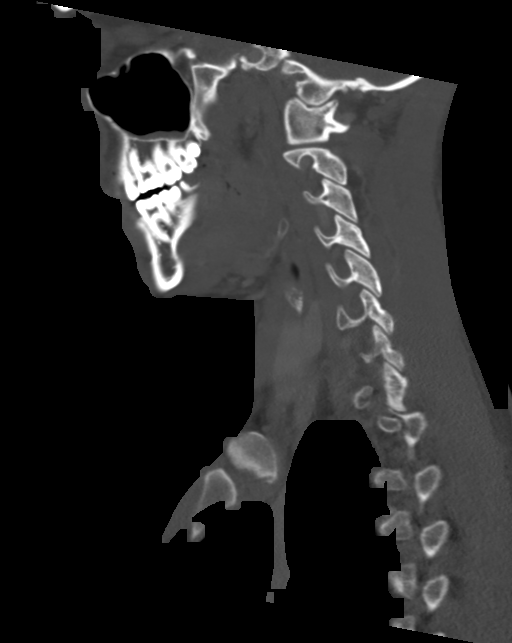
[im 29/69  bone]
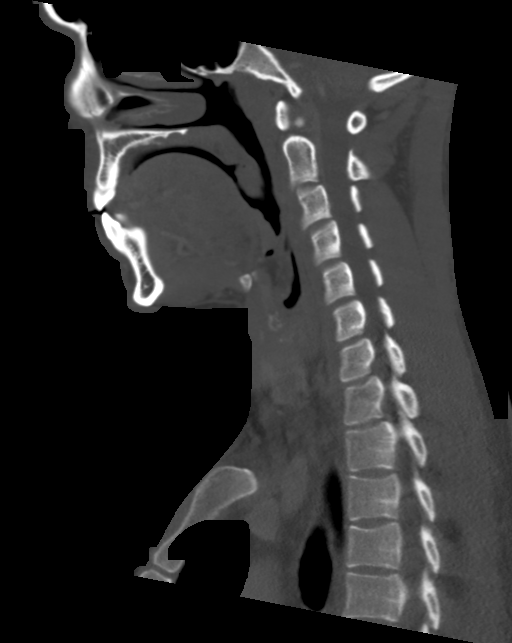
[im 35/69  soft-tissue]
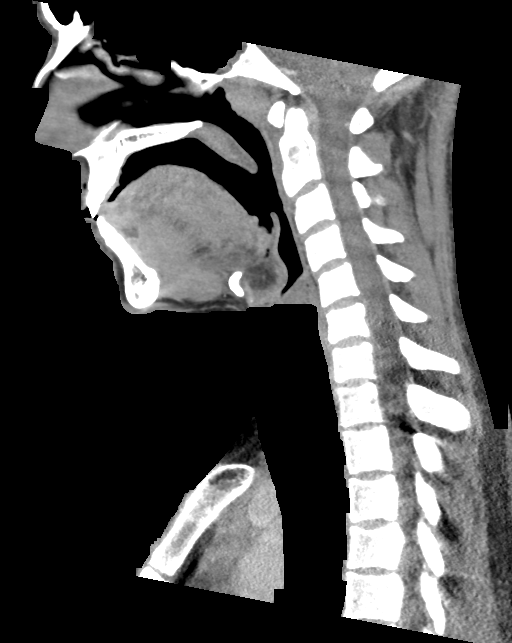
[im 35/69  bone]
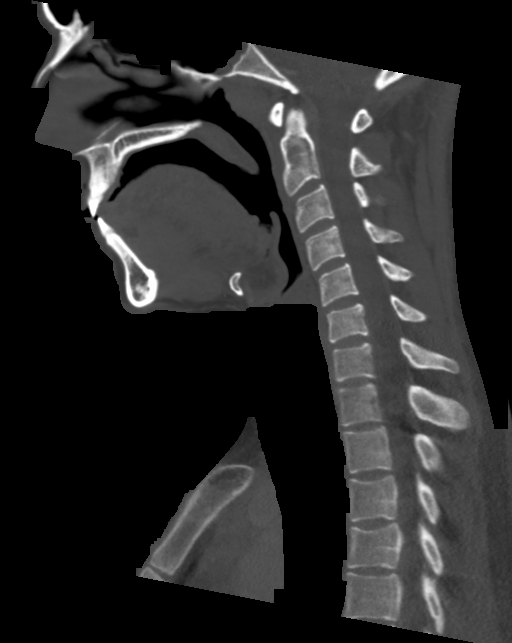
[im 40/69  bone]
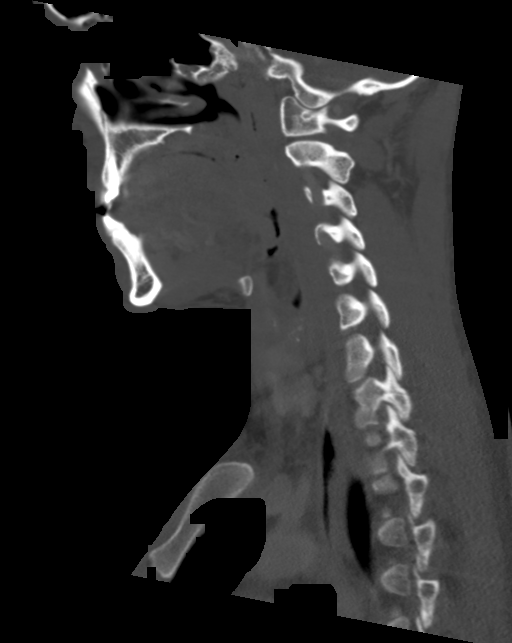
[im 46/69  bone]
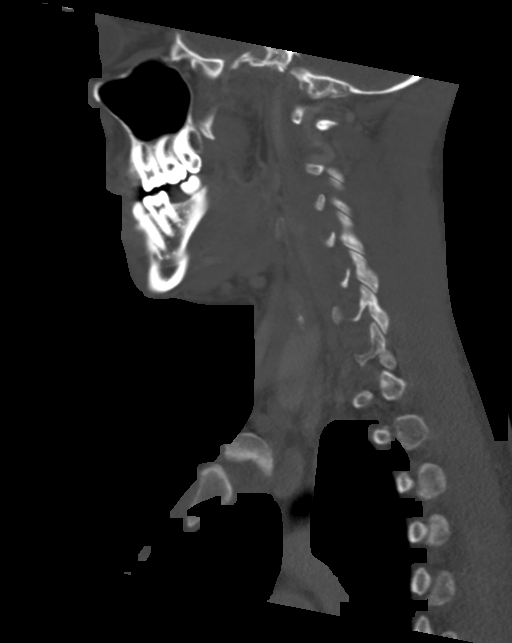

[Series 7: cor neck · coronal · 0.27mm/px · 3 of 111 slices shown]
[im 23/111  bone]
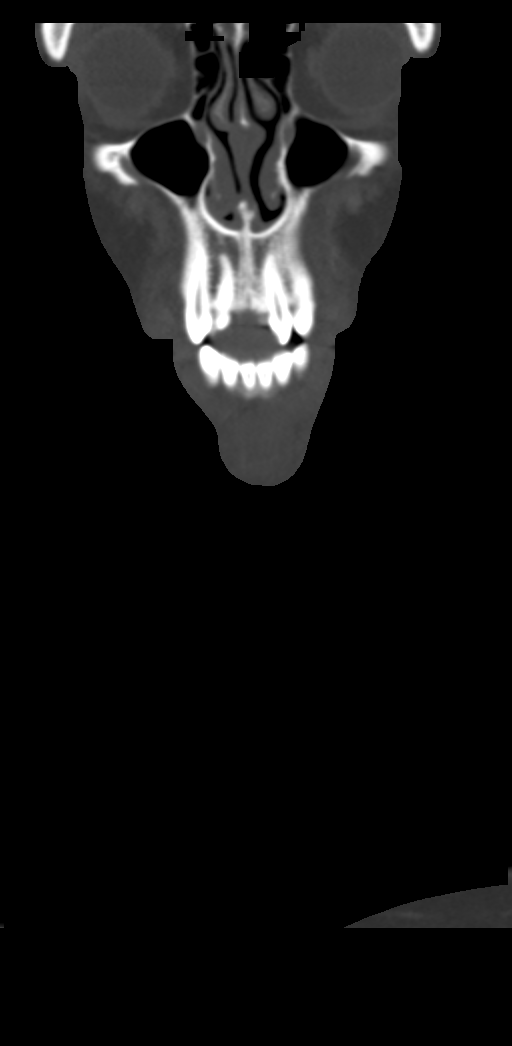
[im 45/111  bone]
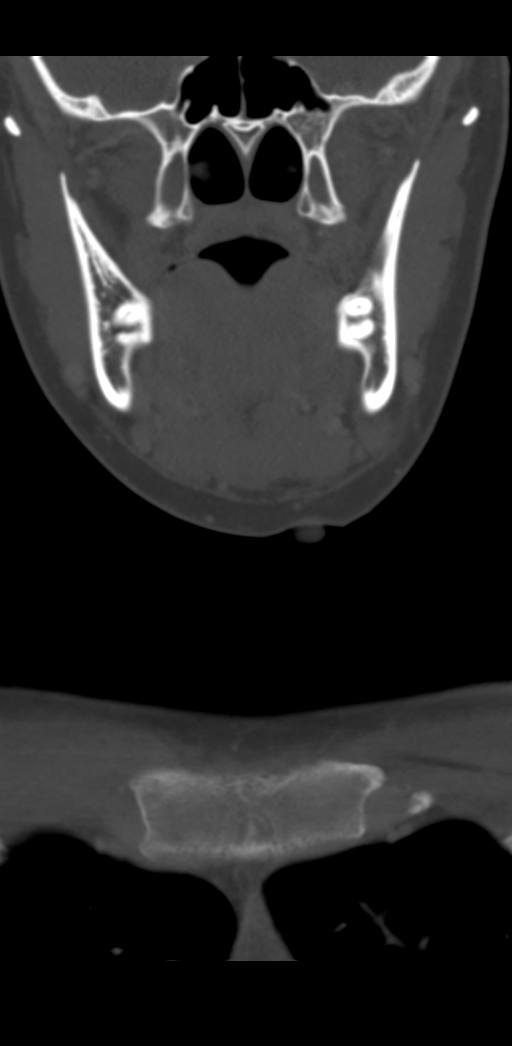
[im 67/111  bone]
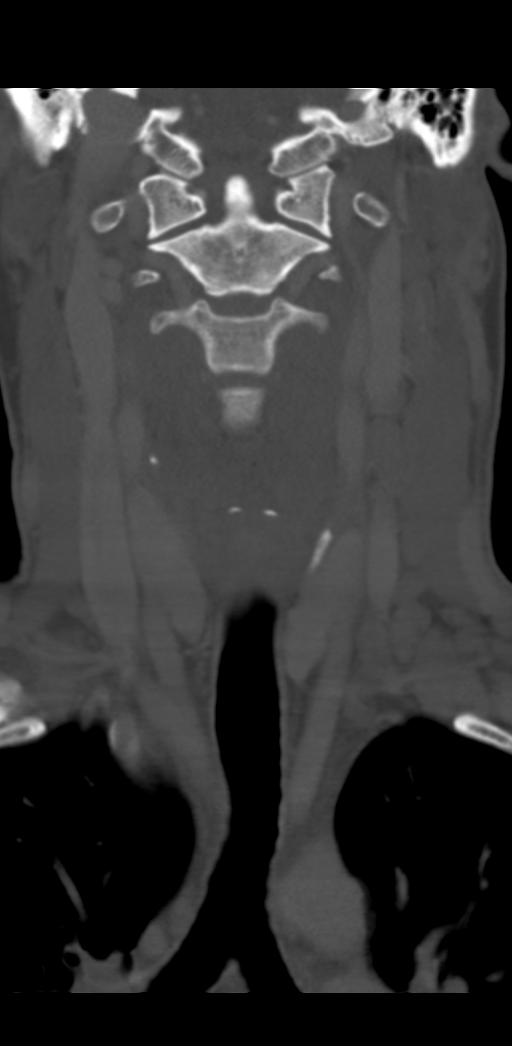

[Series 8: orthogonal ax · axial · 0.27mm/px · z∈[-265,-155]mm · 2 of 140 slices shown, 3 images]
[im 56/140  soft-tissue]
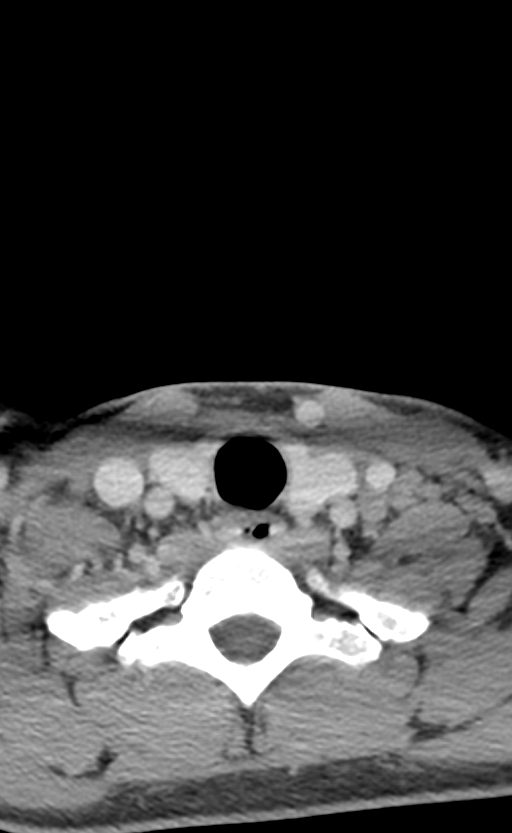
[im 56/140  bone]
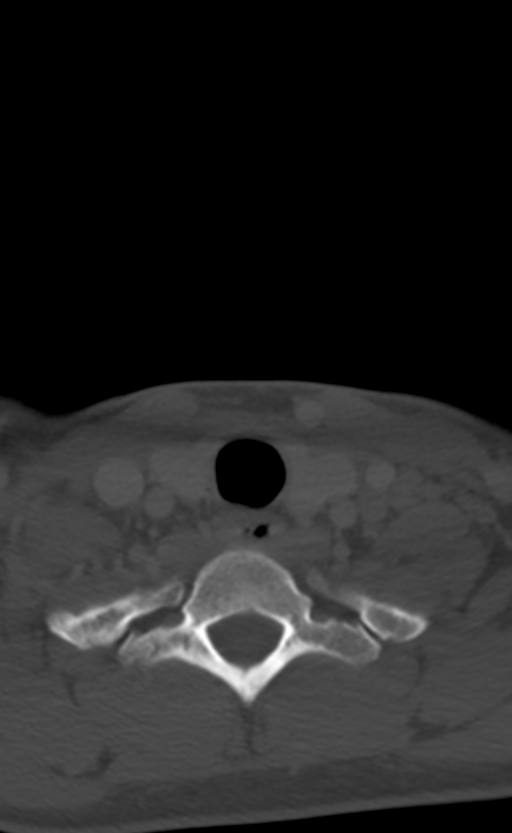
[im 112/140  bone]
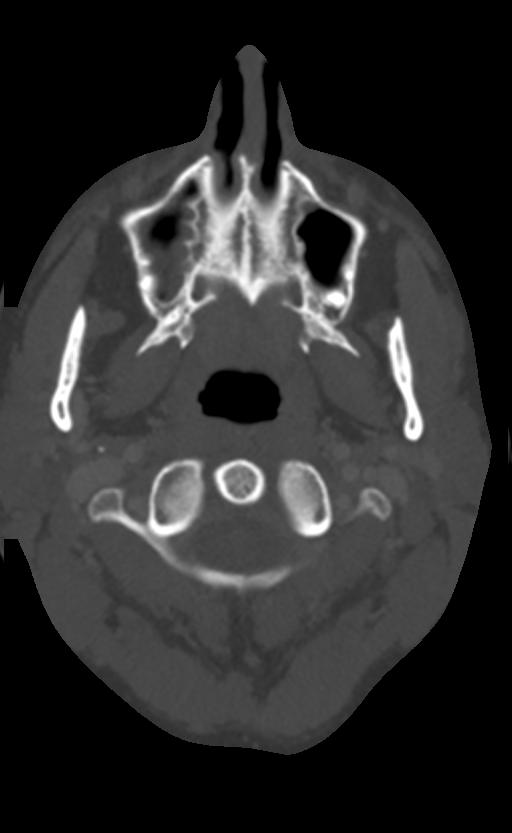

[10 of 33 positions shown; findings below may reference images not displayed]

FINDINGS: Pharynx and larynx:

--Nasopharynx: Fossae of [place] are clear. Normal adenoid
tonsils for age.

--Oral cavity and oropharynx: There are large dental cavities at
teeth 1, 16 and 32. The remainder of the oral cavity and base of
tongue are normal.

--Hypopharynx: Normal vallecula and pyriform sinuses.

--Larynx: Normal epiglottis and pre-epiglottic space. Normal
aryepiglottic and vocal folds.

--Retropharyngeal space: No abscess, effusion or lymphadenopathy.

Salivary glands:

--Parotid: No mass lesion or inflammation. No sialolithiasis or
ductal dilatation.

--Submandibular: Symmetric without inflammation. No sialolithiasis
or ductal dilatation.

--Sublingual: Normal. No ranula or other visible lesion of the base
of tongue and floor of mouth.

Thyroid: Normal.

Lymph nodes: There are enlarged level 1A and 1B lymph nodes
measuring up to 13 mm. No lower cervical lymphadenopathy.

Vascular: Major cervical vessels are patent.

Limited intracranial: Normal.

Visualized orbits: Normal.

Mastoids and visualized paranasal sinuses: Small amount of
secretions in the sphenoid sinuses. No mastoid effusion.

Skeleton: No bony spinal canal stenosis. No lytic or blastic
lesions.

Upper chest: Clear.

Other: None.
IMPRESSION: 1. Left level 1 cervical lymphadenopathy, with one of the enlarged
nodes lying directly beneath the marker showing the site of palpable
abnormality.
2. Multiple large dental caries, including Katiuska Haag of the left 3rd
maxillary molar, which could be a source of the reported left upper
periodontal inflammation.
3. No abscess or drainable fluid collection.

## 2019-03-23 ENCOUNTER — Telehealth: Payer: Self-pay | Admitting: Pharmacy Technician

## 2019-03-23 NOTE — Telephone Encounter (Signed)
Attempted to contact patient to discuss financial information needed to qualify for medication assistance.  Unable to reach.  Left message.  [facility]

## 2019-03-30 ENCOUNTER — Ambulatory Visit (INDEPENDENT_AMBULATORY_CARE_PROVIDER_SITE_OTHER): Payer: Self-pay | Admitting: Psychiatry

## 2019-03-30 ENCOUNTER — Encounter: Payer: Self-pay | Admitting: Psychiatry

## 2019-03-30 ENCOUNTER — Other Ambulatory Visit: Payer: Self-pay

## 2019-03-30 DIAGNOSIS — F17200 Nicotine dependence, unspecified, uncomplicated: Secondary | ICD-10-CM

## 2019-03-30 DIAGNOSIS — F321 Major depressive disorder, single episode, moderate: Secondary | ICD-10-CM

## 2019-03-30 DIAGNOSIS — F1110 Opioid abuse, uncomplicated: Secondary | ICD-10-CM | POA: Insufficient documentation

## 2019-03-30 DIAGNOSIS — Z720 Tobacco use: Secondary | ICD-10-CM | POA: Insufficient documentation

## 2019-03-30 DIAGNOSIS — F1121 Opioid dependence, in remission: Secondary | ICD-10-CM

## 2019-03-30 DIAGNOSIS — F1421 Cocaine dependence, in remission: Secondary | ICD-10-CM

## 2019-03-30 DIAGNOSIS — F411 Generalized anxiety disorder: Secondary | ICD-10-CM

## 2019-03-30 DIAGNOSIS — F1410 Cocaine abuse, uncomplicated: Secondary | ICD-10-CM | POA: Insufficient documentation

## 2019-03-30 MED ORDER — BUSPIRONE HCL 15 MG PO TABS: 15.0000 mg | ORAL_TABLET | Freq: Two times a day (BID) | ORAL | 1 refills | Status: DC

## 2019-03-30 MED ORDER — DIVALPROEX SODIUM ER 250 MG PO TB24: 750.0000 mg | ORAL_TABLET | Freq: Every day | ORAL | 1 refills | Status: DC

## 2019-03-30 NOTE — Patient Instructions (Signed)
Valproic Acid, Divalproex Sodium delayed or extended-release tablets What is this medicine? DIVALPROEX SODIUM (dye VAL pro ex SO dee um) is used to prevent seizures caused by some forms of epilepsy. It is also used to treat bipolar mania and to prevent migraine headaches. This medicine may be used for other purposes; ask your health care provider or pharmacist if you have questions. COMMON BRAND NAME(S): Depakote, Depakote ER What should I tell my health care provider before I take this medicine? They need to know if you have any of these conditions:  if you often drink alcohol  kidney disease  liver disease  low platelet counts  mitochondrial disease  suicidal thoughts, plans, or attempt; a previous suicide attempt by you or a family member  urea cycle disorder (UCD)  an unusual or allergic reaction to divalproex sodium, sodium valproate, valproic acid, other medicines, foods, dyes, or preservatives  pregnant or trying to get pregnant  breast-feeding How should I use this medicine? Take this medicine by mouth with a drink of water. Follow the directions on the prescription label. Do not cut, crush or chew this medicine. You can take it with or without food. If it upsets your stomach, take it with food. Take your medicine at regular intervals. Do not take it more often than directed. Do not stop taking except on your doctor's advice. A special MedGuide will be given to you by the pharmacist with each prescription and refill. Be sure to read this information carefully each time. Talk to your pediatrician regarding the use of this medicine in children. While this drug may be prescribed for children as young as 10 years for selected conditions, precautions do apply. Overdosage: If you think you have taken too much of this medicine contact a poison control center or emergency room at once. NOTE: This medicine is only for you. Do not share this medicine with others. What if I miss a  dose? If you miss a dose, take it as soon as you can. If it is almost time for your next dose, take only that dose. Do not take double or extra doses. What may interact with this medicine? Do not take this medicine with any of the following medications:  sodium phenylbutyrate This medicine may also interact with the following medications:  aspirin  certain antibiotics like ertapenem, imipenem, meropenem  certain medicines for depression, anxiety, or psychotic disturbances  certain medicines for seizures like carbamazepine, clonazepam, diazepam, ethosuximide, felbamate, lamotrigine, phenobarbital, phenytoin, primidone, rufinamide, topiramate  certain medicines that treat or prevent blood clots like warfarin  cholestyramine  male hormones, like estrogens and birth control pills, patches, or rings  propofol  rifampin  ritonavir  tolbutamide  zidovudine This list may not describe all possible interactions. Give your health care provider a list of all the medicines, herbs, non-prescription drugs, or dietary supplements you use. Also tell them if you smoke, drink alcohol, or use illegal drugs. Some items may interact with your medicine. What should I watch for while using this medicine? Tell your doctor or health care provider if your symptoms do not get better or they start to get worse. This medicine may cause serious skin reactions. They can happen weeks to months after starting the medicine. Contact your health care provider right away if you notice fevers or flu-like symptoms with a rash. The rash may be red or purple and then turn into blisters or peeling of the skin. Or, you might notice a red rash with swelling of the   face, lips or lymph nodes in your neck or under your arms. Wear a medical ID bracelet or chain, and carry a card that describes your disease and details of your medicine and dosage times. You may get drowsy, dizzy, or have blurred vision. Do not drive, use  machinery, or do anything that needs mental alertness until you know how this medicine affects you. To reduce dizzy or fainting spells, do not sit or stand up quickly, especially if you are an older patient. Alcohol can increase drowsiness and dizziness. Avoid alcoholic drinks. This medicine can make you more sensitive to the sun. Keep out of the sun. If you cannot avoid being in the sun, wear protective clothing and use sunscreen. Do not use sun lamps or tanning beds/booths. Patients and their families should watch out for new or worsening depression or thoughts of suicide. Also watch out for sudden changes in feelings such as feeling anxious, agitated, panicky, irritable, hostile, aggressive, impulsive, severely restless, overly excited and hyperactive, or not being able to sleep. If this happens, especially at the beginning of treatment or after a change in dose, call your health care provider. Women should inform their doctor if they wish to become pregnant or think they might be pregnant. There is a potential for serious side effects to an unborn child. Talk to your health care provider or pharmacist for more information. Women who become pregnant while using this medicine may enroll in the [place] Antiepileptic Drug Pregnancy Registry by calling 1-888-233-2334. This registry collects information about the safety of antiepileptic drug use during pregnancy. This medicine may cause a decrease in folic acid and vitamin D. You should make sure that you get enough vitamins while you are taking this medicine. Discuss the foods you eat and the vitamins you take with your health care provider. What side effects may I notice from receiving this medicine? Side effects that you should report to your doctor or health care professional as soon as possible:  allergic reactions like skin rash, itching or hives, swelling of the face, lips, or tongue  changes in vision  rash, fever, and swollen lymph  nodes  redness, blistering, peeling or loosening of the skin, including inside the mouth  signs and symptoms of liver injury like dark yellow or brown urine; general ill feeling or flu-like symptoms; light-colored stools; loss of appetite; nausea; right upper belly pain; unusually weak or tired; yellowing of the eyes or skin  suicidal thoughts or other mood changes  unusual bleeding or bruising Side effects that usually do not require medical attention (report to your doctor or health care professional if they continue or are bothersome):  constipation  diarrhea  dizziness  hair loss  headache  loss of appetite  weight gain This list may not describe all possible side effects. Call your doctor for medical advice about side effects. You may report side effects to FDA at 1-800-FDA-1088. Where should I keep my medicine? Keep out of reach of children. Store at room temperature between 15 and 30 degrees C (59 and 86 degrees F). Keep container tightly closed. Throw away any unused medicine after the expiration date. NOTE: This sheet is a summary. It may not cover all possible information. If you have questions about this medicine, talk to your doctor, pharmacist, or health care provider.  2020 Elsevier/Gold Standard (2018-11-19 16:03:42)  
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
## 2019-03-31 ENCOUNTER — Ambulatory Visit: Payer: Medicaid Other | Admitting: Neurology

## 2019-04-11 ENCOUNTER — Telehealth: Payer: Self-pay | Admitting: Psychiatry

## 2019-04-11 NOTE — Telephone Encounter (Signed)
Returned call to pharmacy - discussed that he is on depakote for mood

## 2019-04-19 ENCOUNTER — Ambulatory Visit: Payer: Medicaid Other | Admitting: Neurology

## 2019-04-25 ENCOUNTER — Ambulatory Visit: Payer: Medicaid Other | Admitting: Licensed Clinical Social Worker

## 2019-05-05 ENCOUNTER — Ambulatory Visit: Payer: Medicaid Other | Admitting: Licensed Clinical Social Worker

## 2019-05-05 ENCOUNTER — Other Ambulatory Visit: Payer: Self-pay

## 2019-05-05 ENCOUNTER — Ambulatory Visit (INDEPENDENT_AMBULATORY_CARE_PROVIDER_SITE_OTHER): Payer: Medicaid Other | Admitting: Family Medicine

## 2019-05-05 ENCOUNTER — Other Ambulatory Visit: Payer: Self-pay | Admitting: Family Medicine

## 2019-05-05 DIAGNOSIS — Z590 Homelessness: Secondary | ICD-10-CM | POA: Diagnosis not present

## 2019-05-05 DIAGNOSIS — Z89611 Acquired absence of right leg above knee: Secondary | ICD-10-CM | POA: Diagnosis not present

## 2019-05-05 DIAGNOSIS — F4323 Adjustment disorder with mixed anxiety and depressed mood: Secondary | ICD-10-CM | POA: Diagnosis not present

## 2019-05-05 DIAGNOSIS — Z0289 Encounter for other administrative examinations: Secondary | ICD-10-CM | POA: Diagnosis not present

## 2019-05-05 MED ORDER — MISC. DEVICES MISC: 0 refills | Status: AC

## 2019-05-05 MED ORDER — MISC. DEVICES MISC: 0 refills | Status: DC

## 2019-05-05 NOTE — Progress Notes (Signed)
Patient needs a new prosthetic leg. Is in the process of working with the [facility] but they stated that he needed a recent office visit.
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
## 2019-05-10 ENCOUNTER — Other Ambulatory Visit: Payer: Self-pay

## 2019-05-10 ENCOUNTER — Ambulatory Visit (INDEPENDENT_AMBULATORY_CARE_PROVIDER_SITE_OTHER): Payer: Self-pay | Admitting: Psychiatry

## 2019-05-10 DIAGNOSIS — Z5329 Procedure and treatment not carried out because of patient's decision for other reasons: Secondary | ICD-10-CM

## 2019-05-10 NOTE — Progress Notes (Signed)
No response to call 

## 2019-05-12 ENCOUNTER — Other Ambulatory Visit: Payer: Self-pay | Admitting: Family Medicine

## 2019-05-12 ENCOUNTER — Telehealth: Payer: Self-pay

## 2019-05-12 NOTE — Telephone Encounter (Signed)
pt called stated he is having really bad anxiety and he needs something to help him calm down. states he feels like he is havng a panic attack.

## 2019-05-12 NOTE — Telephone Encounter (Signed)
Patient no showed his appointment 2 days ago, has been noncompliant with recommendations including labs. Please ask to schedule an appointment to be seen. If he is in a crisis please ask him to go to nearest ED.

## 2019-05-17 ENCOUNTER — Telehealth: Payer: Self-pay | Admitting: Family Medicine

## 2019-05-17 NOTE — Telephone Encounter (Signed)
Paperwork was faxed to the [facility] in [place] 05/05/2019. Confirmation was received. Will call office to follow up.

## 2019-05-17 NOTE — Telephone Encounter (Addendum)
Patient called to get an [facility] and  His prosthetic leg. Please follow up.

## 2019-05-17 NOTE — Telephone Encounter (Signed)
NOTED

## 2019-05-18 ENCOUNTER — Telehealth: Payer: Self-pay

## 2019-05-18 NOTE — Telephone Encounter (Signed)
told Jonelle Sidle that he needed a rx note sent to [facility] .

## 2019-05-18 NOTE — Telephone Encounter (Signed)
Ok thanks 

## 2019-05-18 NOTE — Telephone Encounter (Signed)
Paperwork was received. Will place in Dr. Smitty Pluck folder for completion tomorrow.

## 2019-05-18 NOTE — Telephone Encounter (Signed)
Called clinic to follow up. She states that initially paperwork was faxed to [facility]. When she hadn't heard anything she called them yesterday & they gave her the fax number to Primary Care at [facility]. She states that she faxed papers to [phone] yesterday.

## 2019-05-19 NOTE — Telephone Encounter (Signed)
Dr. Margarita Rana states that she completed forms at [facility] & that they were faxed.

## 2019-06-09 ENCOUNTER — Ambulatory Visit (INDEPENDENT_AMBULATORY_CARE_PROVIDER_SITE_OTHER): Payer: Self-pay | Admitting: Vascular Surgery

## 2019-06-13 DIAGNOSIS — M549 Dorsalgia, unspecified: Secondary | ICD-10-CM | POA: Insufficient documentation

## 2019-06-13 DIAGNOSIS — M5440 Lumbago with sciatica, unspecified side: Secondary | ICD-10-CM | POA: Insufficient documentation

## 2019-07-14 ENCOUNTER — Ambulatory Visit (INDEPENDENT_AMBULATORY_CARE_PROVIDER_SITE_OTHER): Payer: Medicaid Other | Admitting: Vascular Surgery

## 2019-07-15 ENCOUNTER — Telehealth: Payer: Self-pay | Admitting: Pharmacy Technician

## 2019-07-15 NOTE — Telephone Encounter (Signed)
Patient failed to provide 2020 poi  No additional medication assistance will be provided by [facility] without the required proof of income documentation.  Patient notified.  Betty J. Kluttz Care Manager [facility] 

## 2019-07-28 ENCOUNTER — Encounter: Payer: Self-pay | Admitting: Neurology

## 2019-08-22 ENCOUNTER — Other Ambulatory Visit: Payer: Self-pay

## 2019-08-22 ENCOUNTER — Telehealth (INDEPENDENT_AMBULATORY_CARE_PROVIDER_SITE_OTHER): Payer: Medicaid Other | Admitting: Neurology

## 2019-08-22 ENCOUNTER — Encounter: Payer: Self-pay | Admitting: Neurology

## 2019-08-22 VITALS — Ht 70.0 in | Wt 180.0 lb

## 2019-08-22 DIAGNOSIS — G40009 Localization-related (focal) (partial) idiopathic epilepsy and epileptic syndromes with seizures of localized onset, not intractable, without status epilepticus: Secondary | ICD-10-CM | POA: Diagnosis not present

## 2019-08-22 NOTE — Progress Notes (Signed)
Virtual Visit via Video Note The purpose of this virtual visit is to provide medical care while limiting exposure to the novel coronavirus.    Consent was obtained for video visit:  Yes.   Answered questions that patient had about telehealth interaction:  Yes.   I discussed the limitations, risks, security and privacy concerns of performing an evaluation and management service by telemedicine. I also discussed with the patient that there may be a patient responsible charge related to this service. The patient expressed understanding and agreed to proceed.  Pt location: Home Physician Location: office Name of referring provider:  Scot Jun, FNP I connected with Joshua [name] at patients initiation/request on 08/22/2019 at  2:00 PM EST by video enabled telemedicine application and verified that I am speaking with the correct person using two identifiers. Pt MRN:  [id number] Pt DOB:  [birth year] Video Participants:  Joshua [name]   History of Present Illness: The patient was seen as a virtual video visit on 08/22/2019. He was last seen 6 months ago in the neurology clinic for seizures after cardiac arrest. He had 2 convulsions in May 2019, then another in December 2019. Cardiac arrest occurred in March 2019. He denies any seizures since December 2019, he reports stopping the Levetiracetam a few months ago. He feels that everything tasted weird while taking it, this has resolved with stopping LEV. He denies any staring/unresponsive episodes, myoclonic jerks. He continues to deal with right leg pain, he states he needs to see how to get a doctor's note because his injured leg is messed up from a recent fall. He has been working part-time. He fell on ice yesterday. He continues to have back pain and reports that an epidural injection had been planned due to a bad disc. He is taking Neurontin 300mg  TID without side effects. He continues to report bad anxiety, it has been hitting him pretty hard lately  that he does not want to go outside or talk to anyone. He feels that bad things from his past pop up in his head, like in a dream but he is awake. He took his friend's Xanax which helped within 10 minutes. His last visit with Psychiatry was in August, at that time prescribed Buspar and Depakote, which he is not taking. He states he "does not want to be a [place] pig with any other medications if he cannot be prescribed what actually truly helps, then he is not going to take these new medications."    History on Initial Assessment 09/20/2018: This is a 28 year old right-handed man with a history of cardiac arrest in March 2019 secondary to polysubstance abuse, presenting for evaluation of seizures. No record of seizures during his hospitalization in March 2019. He and his mother report the first seizure occurred in May 2019 when he had 2 convulsions back to back. There was no prior warning, he recalls feeling stressed out, severely depressed and anxious for a few days prior. He was sitting on a chair when he had the seizures. He awoke to EMS around him, no tongue bite or incontinence, and was brought to [facility] where he was started on Keppra. He did not think he needed it and stopped taking it. He recalls some constipation while taking it. He had another seizure on 08/02/18 while asleep that was witnessed by his mother, his mother reports generalized body jerking with his eyes rolled back for 10 minutes. He recalls feeling depressed and anxious that day,  not feeling right. He told family he would try to go back to sleep then had the convulsion with tongue bite. He was confused on EMS arrival. UDS at that time negative. He was discharged home on Keppra 500mg  BID and again reports some constipation. He saw his PCP who ordered an MRI brain without contrast done 09/06/2018 which I personally reviewed, no acute changes, hippocampi symmetric. 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   He was admitted for cardiac arrest secondary to substance abuse in March 2019. 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 He was also noted to have chronic pain on Neurontin, oxycodone was discontinued. He has not been able to get Neurontin or Xanax, which he states really helped a lot. He is very depressed in the office today with poor eye contact. He has not started Zoloft yet. He states he hurts all the time, and his brace makes his leg hurt more. He has headaches, neck and back pain, and poor sleep. He denies any dizziness, diplopia, dysarthria/dysphagia, bladder dysfunction. He denies any olfactory/gustatory hallucinations, rising epigastric sensation, focal numbness/tingling/weakness, myoclonic jerks.   Epilepsy Risk Factors:  History of hypoxic injury in March 2019. His maternal grandfather had seizures with leukemia. Otherwise he had a normal birth and early development.  There is no history of febrile convulsions, CNS infections such as meningitis/encephalitis,   Current Outpatient Medications on File Prior to Visit  Medication Sig Dispense Refill  . gabapentin (NEURONTIN) 300 MG capsule Take 1 capsule (300 mg total) by mouth 3 (three) times daily. 90 capsule 3  . Misc. Devices MISC Right leg Prosthetic. Dx: Right AKA 1 each 0   No current facility-administered medications on file prior to visit.     Observations/Objective:   Vitals:   08/22/19 0907  Weight: 180 lb (81.6 kg)  Height: 5\' 10"  (1.778 m)   GEN:  The patient appears stated age and is in NAD. Neurological examination: Patient is awake, alert, oriented x 3. No aphasia or dysarthria. Intact fluency and comprehension. Cranial nerves: Extraocular movements intact with no nystagmus. No facial asymmetry. Motor: moves all extremities symmetrically, at least anti-gravity x 4. He is s/p right  AKA.   Assessment and Plan:   This is a 28 yo RH man with a history of chronic pain, cardiac arrest in March 2019 secondary to polysubstance abuse, with subsequent seizures. He had 2 seizures in May 2019 then another seizure last 08/02/18. He denies any further substance abuse. He denies any seizures since 07/2018 and had self-discontinued Levetiracetam a few months ago. MRI brain no acute changes, hippocampi symmetric. EEG normal. He is on gabapentin for pain, which is also a seizure medication. Continue on current medications, he was encouraged to continue follow-up with Psychiatry for anxiety, but is resistant to trying medications other than Xanax. He is aware of [place] driving laws to stop driving after a seizure, until 6 months seizure-free. Follow-up prn, he knows to call for any changes.    Follow Up Instructions:   -I discussed the assessment and treatment plan with the patient. The patient was provided an opportunity to ask questions and all were answered. The patient agreed with the plan and demonstrated an understanding of the instructions.   The patient was advised to call back or seek an in-person evaluation if the symptoms worsen or if the condition fails to improve as anticipated.  Cameron Sprang, MD

## 2019-10-20 NOTE — Progress Notes (Signed)
This note is not being shared with the patient for the following reason: To prevent harm (release of this note would result in harm to the life or physical safety of the patient or another).    Patient: Joshua [name], Male    DOB: September 17, 1990, 29 y.o.   MRN: [id number] Visit Date: 10/20/2019  Today's Provider: Marcille Buffy, FNP   Chief Complaint  Patient presents with  . New Patient (Initial Visit)   Subjective:   Virtual Visit via Video Note  I connected with Joshua [name] on 10/21/19 at 10:00 AM EST by a video enabled telemedicine application and verified that I am speaking with the correct person using two identifiers.  Location: Patient: at home  Provider: Provider: Provider's office at  [facility] [place], [place].      I discussed the limitations of evaluation and management by telemedicine and the availability of in person appointments. The patient expressed understanding and agreed to proceed.    I discussed the assessment and treatment plan with the patient. The patient was provided an opportunity to ask questions and all were answered. The patient agreed with the plan and demonstrated an understanding of the instructions.   The patient was advised to call back or seek an in-person evaluation if the symptoms worsen or if the condition fails to improve as anticipated.      New Patient Joshua [name] is a 29 y.o. male who presents today for health maintenance and establish care as a new patient. He feels fairly well. He reports he is not actively exercising. He reports he is sleeping well on average 8-10hrs.   Patient was scheduled in the office for a visit and called 10 minutes before appointment wanting to change to virtual because he did not feel like putting his leg on today.   Localization-related idiopathic epilepsy and epileptic syndromes with seizures of localized onset, not intractable, without status epilepticus [facility])- he sees neurology  Aquino.   Previous PCP was Joshua [name].   He has seen Dr. 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  This is what caused his rhabdomyolysis, and he lost his right lower leg.  He has multiple psychiatrist cancellations in epic system. Documented history of major depression and anxiety . He feels as if he is having some mild increase in anxiety. He has history of seizures- none in one year.  He was asked screening questions for PHQ-9 as well as GAD-7 by Ahmed Prima, CMA in the clinic prior to his visit and he answered no to all the screening questions.  He then tells provider on the phone that he has anxiety issues he would like me to address over the phone today.  Denies any current suicidal/ homicidal ideation or intent currently or recently.  He stays home, since his AKA and does not work.   His medications are Neurontin and  air inhalers. He has plenty of  medications he reports that he just needs to get help with his back pain and his anxiety today.   History of documented drug overdose 2019. History of IV drug use with cardiac arrest. Documented Right lower extremity abive the knee and amputation for severe rhabdomyosis.  Visit Diagnosis:    ICD-10-CM   1. MDD (major depressive disorder), single episode, moderate (HCC)  F32.1 TSH    divalproex (DEPAKOTE ER) 250  MG 24 hr tablet    Valproic acid level    Comprehensive metabolic panel    busPIRone (BUSPAR) 15 MG tablet  2. GAD (generalized anxiety disorder)  F41.1 divalproex (DEPAKOTE ER) 250 MG 24 hr tablet    Valproic acid level    Comprehensive metabolic panel    busPIRone (BUSPAR) 15 MG tablet  3. Tobacco use disorder  F17.200   4. Cocaine use disorder, severe, in sustained remission (HCC)  F14.21   5. Opioid use disorder, moderate, in sustained remission (HCC)  F11.21      He complains of day of disc in  his back, his ears have a lot of wax and he wants medication for pain and anxiety.    1. Prominent edema within the bilateral paraspinous muscles likely reflects rhabdomyolysis given clinical history. Small areas of intramuscular hemorrhage within the left paraspinous muscles. No focal fluid collection. 2. Mild left lateral recess and neuroforaminal stenosis at L4-L5 due to small disc protrusion. No high-grade spinal canal or neuroforaminal stenosis in the lumbar spine. 3. Annular fissure at L5-S1.   Electronically Signed   By: Obie Dredge M.D.   On: 11/12/2017 20:23  IMPRESSION: 1. No acute osseous or cord signal abnormality. 2. Mild C5-6 discogenic degenerative changes with a small disc bulge and central annular fissure effacing ventral thecal sac. No cord compression, foraminal stenosis, or canal stenosis.  He has seen pain medicine clinic  for his back in the past, he reports it did not work and he did not want to go back there. -----------------------------------------------------------------   Review of Systems  Constitutional: Negative.   HENT: Negative.   Eyes: Positive for photophobia. Negative for pain, discharge, redness, itching and visual disturbance.  Respiratory: Negative.   Cardiovascular: Negative.   Gastrointestinal: Negative.   Endocrine: Negative.   Genitourinary: Negative.   Musculoskeletal: Positive for neck pain and neck stiffness. Negative for arthralgias, back pain, gait problem, joint swelling and myalgias.  Skin: Negative.   Allergic/Immunologic: Negative.   Neurological: Negative.   Hematological: Negative.   Psychiatric/Behavioral: Positive for confusion. Negative for decreased concentration, dysphoric mood, hallucinations, self-injury, sleep disturbance and suicidal ideas. The patient is nervous/anxious. The patient is not hyperactive.   All other systems reviewed and are negative.  He reports  that his photophobia is not new, he has ear  pressure, he reports that he has a cerumen impaction, however that cannot be treated over the phone and patient is advised he will have to be seen in person. Social History He  reports that he has been smoking cigarettes. He has been smoking about 0.50 packs per day. He has never used smokeless tobacco. He reports previous drug use. Drugs: Cocaine and Methamphetamines. He reports that he does not drink alcohol. Social History   Socioeconomic History  . Marital status: Legally Separated    Spouse name: Not on file  . Number of children: Not on file  . Years of education: Not on file  . Highest education level: 11th grade  Occupational History  . Not on file  Tobacco Use  . Smoking status: Current Every Day Smoker    Packs/day: 0.50    Types: Cigarettes  . Smokeless tobacco: Never Used  Substance and Sexual Activity  . Alcohol use: No  . Drug use: Not Currently    Types: Cocaine, Methamphetamines  . Sexual activity: Not on file  Other Topics Concern  . Not on file  Social History Narrative   Pt is right handed  Lives in single story home with his mother   11th grade education   Last employment was at [employer] in [place]   Right leg amputated   Social Determinants of Health   Financial Resource Strain:   . Difficulty of Paying Living Expenses: Not on file  Food Insecurity:   . Worried About Programme researcher, broadcasting/film/video in the Last Year: Not on file  . Ran Out of Food in the Last Year: Not on file  Transportation Needs:   . Lack of Transportation (Medical): Not on file  . Lack of Transportation (Non-Medical): Not on file  Physical Activity:   . Days of Exercise per Week: Not on file  . Minutes of Exercise per Session: Not on file  Stress:   . Feeling of Stress : Not on file  Social Connections:   . Frequency of Communication with Friends and Family: Not on file  . Frequency of Social Gatherings with Friends and Family: Not on file  . Attends Religious Services: Not on  file  . Active Member of Clubs or Organizations: Not on file  . Attends Banker Meetings: Not on file  . Marital Status: Not on file    Patient Active Problem List   Diagnosis Date Noted  . MDD (major depressive disorder), single episode, moderate (HCC) 03/30/2019  . GAD (generalized anxiety disorder) 03/30/2019  . Tobacco use disorder 03/30/2019  . Cocaine use disorder, severe, in sustained remission (HCC) 03/30/2019  . Opioid use disorder, moderate, in sustained remission (HCC) 03/30/2019  . Leg pain, anterior, left 12/06/2018  . 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 Impotence 12/08/2017  . Chronic bilateral low back pain without sciatica 12/08/2017  . Adjustment disorder with mixed anxiety and depressed mood 11/13/2017  . At risk for abuse of opiates 11/13/2017  . Acute kidney failure, unspecified (HCC) 11/02/2017  . Rhabdomyolysis 11/02/2017  . Acute respiratory failure (HCC) 11/02/2017  . Cardiogenic shock (HCC) 10/30/2017  . Cardiac arrest (HCC) 10/30/2017  . Drug abuse (HCC) 10/30/2017    Past Surgical History:  Procedure Laterality Date  . AMPUTATION Right 11/03/2017   Procedure: KNEE DISARTICULATION;  Surgeon: Renford Dills, MD;  Location: [facility] ORS;  Service: Vascular;  Laterality: Right;  . AMPUTATION Right 11/10/2017   Procedure: AMPUTATION ABOVE KNEE;  Surgeon: Renford Dills, MD;  Location: [facility] ORS;  Service: Vascular;  Laterality: Right;  . ankle/foot surgery with metal plates    . DIALYSIS/PERMA CATHETER INSERTION N/A 11/16/2017   Procedure: DIALYSIS/PERMA CATHETER INSERTION;  Surgeon: Annice Needy, MD;  Location: [facility] INVASIVE CV LAB;  Service: Cardiovascular;  Laterality: N/A;  . DIALYSIS/PERMA CATHETER REMOVAL N/A 12/14/2017   Procedure: DIALYSIS/PERMA CATHETER REMOVAL;  Surgeon: Annice Needy, MD;  Location: [facility] INVASIVE CV LAB;  Service: Cardiovascular;  Laterality: N/A;  . permacath to right side     Scheduled to be  removed    Family History  Family Status  Relation Name Status  . Mother  Alive  . Father  Deceased  . Neg Hx  (Not Specified)   His family history includes Bipolar disorder in his mother; Depression in his mother.     Allergies  Allergen Reactions  . Codeine Other (See Comments)    seizures    Previous Medications   GABAPENTIN (NEURONTIN) 300 MG CAPSULE    Take 1 capsule (300 mg total) by mouth 3 (three) times daily.   MISC. DEVICES MISC    Right leg  Prosthetic. Dx: Right AKA    Patient Care Team: Bing NeighborsHarris, Kimberly S, FNP as PCP - General (Family Medicine) Van ClinesAquino, Karen M, MD as Consulting Physician (Neurology)      Objective:   No vital signs are available.  This is a video visit.  Physical Exam  Patient preferred a video visit, he did not show up for an in person visit, call the office 10 months before his visit to switch to a video.  Patient was on the video, however provider was unable to see his face, only saw a blur on the video.  Asked patient to readjust camera, however he reported that he did not want to be seen on the camera today.  So provider was able to talk to patient but unable to see the patient.  Patient did not sound like he was in any distress.  He was lying in his bed he reported and resting.  He was aware that he/she can have an in person evaluation if any symptoms change or worsen.  He verbalized understanding of this. Depression Screen PHQ 2/9 Scores 01/11/2019  PHQ - 2 Score 4  PHQ- 9 Score 15  Some encounter information is confidential and restricted. Go to Review Flowsheets activity to see all data.   [facility] 10/21/2019 10/21/2019 03/30/2019 01/11/2019  Decreased Interest 1 1 2 2   Down, Depressed, Hopeless 0 0 3 2  PHQ - 2 Score 1 1 5 4   Altered sleeping 1 - 3 2  Tired, decreased energy 1 - 3 2  Change in appetite 0 - 3 3  Feeling bad or failure about yourself  1 - 2 1  Trouble concentrating 0 - 3 2  Moving slowly or  fidgety/restless 0 - 1 0  Suicidal thoughts 0 - 0 1  PHQ-9 Score 4 - 20 15  Difficult doing work/chores Somewhat difficult - - -    GAD 7 : Generalized Anxiety Score 10/21/2019 03/30/2019 01/11/2019  Nervous, Anxious, on Edge 1 3 1   Control/stop worrying 0 3 1  Worry too much - different things 1 3 2   Trouble relaxing 1 3 1   Restless 1 2 0  Easily annoyed or irritable 2 3 1   Afraid - awful might happen 0 2 1  Total GAD 7 Score 6 19 7   Anxiety Difficulty Not difficult at all Extremely difficult -     Assessment & Plan:     Routine Health Maintenance and Physical Exam  Exercise Activities and Dietary recommendations Goals   None     Immunization History  Administered Date(s) Administered  . PPD Test 11/11/2017  . Tdap 10/01/2015    Health Maintenance  Topic Date Due  . INFLUENZA VACCINE  03/26/2019  . TETANUS/TDAP  09/30/2025  . HIV Screening  Completed     Discussed health benefits of physical activity, and encouraged him to engage in regular exercise appropriate for his age and condition.     S/P AKA (above knee amputation), right (HCC) - Plan: Ambulatory referral to Orthopedic Surgery, Ambulatory referral to Psychiatry  Anxiety - Plan: Ambulatory referral to Psychiatry  Cocaine use disorder, severe, in sustained remission (HCC) - Plan: Ambulatory referral to Orthopedic Surgery, Ambulatory referral to Psychiatry  Adjustment disorder with mixed anxiety and depressed mood - Plan: Ambulatory referral to Orthopedic Surgery, Ambulatory referral to Psychiatry  At risk for abuse of opiates - Plan: Ambulatory referral to Orthopedic Surgery, Ambulatory referral to Psychiatry  Opioid use disorder, moderate, in sustained remission (HCC) -  Plan: Ambulatory referral to Orthopedic Surgery, Ambulatory referral to Psychiatry  GAD (generalized anxiety disorder) - Plan: Ambulatory referral to Psychiatry  MDD (major depressive disorder), single episode, moderate (HCC) - Plan:  Ambulatory referral to Psychiatry  Tobacco use disorder - Plan: Ambulatory referral to Psychiatry  Leg pain, anterior, left - Plan: Ambulatory referral to Orthopedic Surgery, Ambulatory referral to Psychiatry  Chronic bilateral low back pain without sciatica - Plan: Ambulatory referral to Orthopedic Surgery, Ambulatory referral to Psychiatry  Acute renal failure, unspecified acute renal failure type (HCC) - Plan: Ambulatory referral to Psychiatry  Drug abuse (HCC) - Plan: Ambulatory referral to Orthopedic Surgery, Ambulatory referral to Psychiatry  Cardiac arrest [facility])- due to IV drug use in past - Plan: Ambulatory referral to Orthopedic Surgery, Ambulatory referral to Psychiatry  Chronic midline low back pain without sciatica - Plan: Ambulatory referral to Orthopedic Surgery  Ear fullness, bilateral  History of seizures   Orders Placed This Encounter  Procedures  . Ambulatory referral to Orthopedic Surgery    Referral Priority:   Urgent    Referral Type:   Surgical    Referral Reason:   Specialty Services Required    Requested Specialty:   Orthopedic Surgery    Number of Visits Requested:   1  . Ambulatory referral to Psychiatry    Referral Priority:   Urgent    Referral Type:   Psychiatric    Referral Reason:   Specialty Services Required    Referred to Provider:   Oletta Darter, MD    Requested Specialty:   Psychiatry    Number of Visits Requested:   1   He is advised to follow-up with his neurologist for his photophobia, as well as have an eye exam at ophthalmologist. His ear fullness I am unable to treat that on the phone today, he will need to come to the clinic for an in person evaluation or if worsens over this weekend since today is Friday he should be seen at the urgent care. Patient requested pain medications as well as anxiety medications, however with history of suicide attempt in the past with IV drug use, and history of cardiac arrest related to this.  As well  as documented noncompliance from his previous primary care provider Joaquin Courts, who stopped giving him all anxiety medications as he would not keep follow-ups or be seen by behavioral health.  I have reviewed the chart and patient has missed multiple appointments and follow-ups. He was advised by vascular doctor to follow-up with the maker of the casting for his prosthetic leg and he has not yet done this, he has chronic back pain without any paresthesias or groin saddle paresthesias, denies bowel or bladder loss of control.  We will send him to orthopedics for a further work-up. Also placed an urgent referral to psychiatry for him today he requested not to see the last psychiatrist that he saw in the system, but he did not like that provider. Port[place] he is in agreement to go. He understands with his history that I am not able to prescribe narcotics or any depression intact anxiety medications due to worry for his safety.  Aware of walk-in clinics that are available for psychiatric care [facility] in [place] as well as [facility] has walk-in hours.  This information will be sent to patient in my chart.  Perform drug testing of urine at next visit.  Orders Placed This Encounter  Procedures  . CBC with Differential/Platelet  . Comprehensive Metabolic Panel (CMET)  .  TSH  . Lipid panel  . Ethanol  . HIV Antibody (routine testing w rflx)  . RPR  . Hepatitis panel, acute  . Ambulatory referral to Orthopedic Surgery  . Ambulatory referral to Psychiatry  he is aware of lab orders and walk in hours and he should come for labs as soon as possible.   Orders Placed This Encounter  Procedures  . Ambulatory referral to Orthopedic Surgery    Referral Priority:   Urgent    Referral Type:   Surgical    Referral Reason:   Specialty Services Required    Requested Specialty:   Orthopedic Surgery    Number of Visits Requested:   1  . Ambulatory referral to Psychiatry    Referral Priority:   Urgent     Referral Type:   Psychiatric    Referral Reason:   Specialty Services Required    Referred to Provider:   Oletta Darter, MD    Requested Specialty:   Psychiatry    Number of Visits Requested:   1   Return in about 1 week (around 10/28/2019), or if symptoms worsen or fail to improve, for Go to Emergency room/ urgent care if worse, at any time for any worsening symptoms.  Over 1 hour including time spent with patinet and time reviewing and coordination of care was spent.  There are other unrelated non-urgent complaints, but due to the busy schedule and the amount of time I've already spent with him, time does not permit me to address these routine issues at today's visit. I've requested another appointment to review these additional issues.  The entirety of the information documented in the History of Present Illness, Review of Systems and Physical Exam were personally obtained by me. Portions of this information were initially documented by the  Certified Medical Assistant whose name is documented in Epic and reviewed by me for thoroughness and accuracy.  I have personally performed the exam and reviewed the chart and it is accurate to the best of my knowledge.  Museum/gallery conservator has been used and any errors in dictation or transcription are unintentional.  Eula Fried. Flinchum FNP-C  [facility]  --------------------------------------------------------------------

## 2019-10-21 ENCOUNTER — Other Ambulatory Visit: Payer: Self-pay

## 2019-10-21 ENCOUNTER — Encounter: Payer: Self-pay | Admitting: Adult Health

## 2019-10-21 ENCOUNTER — Ambulatory Visit (INDEPENDENT_AMBULATORY_CARE_PROVIDER_SITE_OTHER): Payer: Medicaid Other | Admitting: Adult Health

## 2019-10-21 DIAGNOSIS — F4323 Adjustment disorder with mixed anxiety and depressed mood: Secondary | ICD-10-CM

## 2019-10-21 DIAGNOSIS — R6889 Other general symptoms and signs: Secondary | ICD-10-CM | POA: Insufficient documentation

## 2019-10-21 DIAGNOSIS — Z89611 Acquired absence of right leg above knee: Secondary | ICD-10-CM | POA: Diagnosis not present

## 2019-10-21 DIAGNOSIS — F321 Major depressive disorder, single episode, moderate: Secondary | ICD-10-CM

## 2019-10-21 DIAGNOSIS — I469 Cardiac arrest, cause unspecified: Secondary | ICD-10-CM

## 2019-10-21 DIAGNOSIS — Z9189 Other specified personal risk factors, not elsewhere classified: Secondary | ICD-10-CM

## 2019-10-21 DIAGNOSIS — F1121 Opioid dependence, in remission: Secondary | ICD-10-CM

## 2019-10-21 DIAGNOSIS — M545 Low back pain, unspecified: Secondary | ICD-10-CM

## 2019-10-21 DIAGNOSIS — F1421 Cocaine dependence, in remission: Secondary | ICD-10-CM | POA: Diagnosis not present

## 2019-10-21 DIAGNOSIS — M79605 Pain in left leg: Secondary | ICD-10-CM

## 2019-10-21 DIAGNOSIS — F419 Anxiety disorder, unspecified: Secondary | ICD-10-CM | POA: Diagnosis not present

## 2019-10-21 DIAGNOSIS — F411 Generalized anxiety disorder: Secondary | ICD-10-CM

## 2019-10-21 DIAGNOSIS — H938X3 Other specified disorders of ear, bilateral: Secondary | ICD-10-CM

## 2019-10-21 DIAGNOSIS — F1910 Other psychoactive substance abuse, uncomplicated: Secondary | ICD-10-CM

## 2019-10-21 DIAGNOSIS — N179 Acute kidney failure, unspecified: Secondary | ICD-10-CM

## 2019-10-21 DIAGNOSIS — Z91199 Patient's noncompliance with other medical treatment and regimen due to unspecified reason: Secondary | ICD-10-CM

## 2019-10-21 DIAGNOSIS — Z0000 Encounter for general adult medical examination without abnormal findings: Secondary | ICD-10-CM | POA: Diagnosis not present

## 2019-10-21 DIAGNOSIS — Z13220 Encounter for screening for lipoid disorders: Secondary | ICD-10-CM

## 2019-10-21 DIAGNOSIS — F17200 Nicotine dependence, unspecified, uncomplicated: Secondary | ICD-10-CM

## 2019-10-21 DIAGNOSIS — Z87898 Personal history of other specified conditions: Secondary | ICD-10-CM

## 2019-10-21 DIAGNOSIS — G8929 Other chronic pain: Secondary | ICD-10-CM

## 2019-10-21 DIAGNOSIS — Z9119 Patient's noncompliance with other medical treatment and regimen: Secondary | ICD-10-CM

## 2019-10-21 NOTE — Patient Instructions (Signed)
Psychiatric/Counseling Resources Discussed As Follows:  If Emergency please seek Emergency Room Care Immediately or Call 911.   [facility] Address:  [place], [place] [address] Phone: [phone] Website : [email or web address]   [facility] Address:  [address] [place], [place] [address] Phone: [phone] Fax: [phone] Website: [email or web address] How To Access Our Services Because our main goal is to meet the needs of our consumers, [facility] operates on a walk-in basis! To access services, there are just 3 easy steps: 1) Walk in any Monday, Wednesday or Friday between 8:00 am and 3:00 pm and complete our consumer paperwork 2) A Comprehensive Clinical Assessment (CCA) will be completed and appropriate service recommendations will be provided 3) Recommendations are sent to [facility] team members and the appropriate staff will call you within days. Advanced Access Open M - F, 8:00 am - 8:00 pm  Mental health crisis services for all age groups  Triage  Psychiatric Evaluations  Involuntary Commitments  [facility]  Address: [address], [place], [place] [address] Website : [email or web address] Walk in's accepted see web site or call for more information Phone : [phone] Also has [facility] [phone]    Psychology Today Find a therapist by searching online in your area or specialist by your diagnosis Website:  [email or web address]/us   Acute Back Pain, Adult Acute back pain is sudden and usually short-lived. It is often caused by an injury to the muscles and tissues in the back. The injury may result from:  A muscle or ligament getting overstretched or torn (strained). Ligaments are tissues that connect bones to each other. Lifting something improperly can cause a back strain.  Wear and tear (degeneration) of the spinal  disks. Spinal disks are circular tissue that provides cushioning between the bones of the spine (vertebrae).  Twisting motions, such as while playing sports or doing yard work.  A hit to the back.  Arthritis. You may have a physical exam, lab tests, and imaging tests to find the cause of your pain. Acute back pain usually goes away with rest and home care. Follow these instructions at home: Managing pain, stiffness, and swelling  Take over-the-counter and prescription medicines only as told by your health care provider.  Your health care provider may recommend applying ice during the first 24-48 hours after your pain starts. To do this: ? Put ice in a plastic bag. ? Place a towel between your skin and the bag. ? Leave the ice on for 20 minutes, 2-3 times a day.  If directed, apply heat to the affected area as often as told by your health care provider. Use the heat source that your health care provider recommends, such as a moist heat pack or a heating pad. ? Place a towel between your skin and the heat source. ? Leave the heat on for 20-30 minutes. ? Remove the heat if your skin turns bright red. This is especially important if you are unable to feel pain, heat, or cold. You have a greater risk of getting burned. Activity   Do not stay in bed. Staying in bed for more than 1-2 days can delay your recovery.  Sit up and stand up straight. Avoid leaning forward when you sit, or hunching over when you stand. ? If you work at a desk, sit close to it so you do not need to lean over. Keep your chin tucked in. Keep your neck drawn back, and keep your elbows  bent at a right angle. Your arms should look like the letter "L." ? Sit high and close to the steering wheel when you drive. Add lower back (lumbar) support to your car seat, if needed.  Take short walks on even surfaces as soon as you are able. Try to increase the length of time you walk each day.  Do not sit, drive, or stand in one  place for more than 30 minutes at a time. Sitting or standing for long periods of time can put stress on your back.  Do not drive or use heavy machinery while taking prescription pain medicine.  Use proper lifting techniques. When you bend and lift, use positions that put less stress on your back: ? [place] your knees. ? Keep the load close to your body. ? Avoid twisting.  Exercise regularly as told by your health care provider. Exercising helps your back heal faster and helps prevent back injuries by keeping muscles strong and flexible.  Work with a physical therapist to make a safe exercise program, as recommended by your health care provider. Do any exercises as told by your physical therapist. Lifestyle  Maintain a healthy weight. Extra weight puts stress on your back and makes it difficult to have good posture.  Avoid activities or situations that make you feel anxious or stressed. Stress and anxiety increase muscle tension and can make back pain worse. Learn ways to manage anxiety and stress, such as through exercise. General instructions  Sleep on a firm mattress in a comfortable position. Try lying on your side with your knees slightly bent. If you lie on your back, put a pillow under your knees.  Follow your treatment plan as told by your health care provider. This may include: ? Cognitive or behavioral therapy. ? Acupuncture or massage therapy. ? Meditation or yoga. Contact a health care provider if:  You have pain that is not relieved with rest or medicine.  You have increasing pain going down into your legs or buttocks.  Your pain does not improve after 2 weeks.  You have pain at night.  You lose weight without trying.  You have a fever or chills. Get help right away if:  You develop new bowel or bladder control problems.  You have unusual weakness or numbness in your arms or legs.  You develop nausea or vomiting.  You develop abdominal pain.  You feel  faint. Summary  Acute back pain is sudden and usually short-lived.  Use proper lifting techniques. When you bend and lift, use positions that put less stress on your back.  Take over-the-counter and prescription medicines and apply heat or ice as directed by your health care provider. This information is not intended to replace advice given to you by your health care provider. Make sure you discuss any questions you have with your health care provider. Document Revised: 11/30/2018 Document Reviewed: 03/25/2017 Elsevier Patient Education  2020 [employer].

## 2019-10-27 ENCOUNTER — Other Ambulatory Visit: Payer: Self-pay

## 2019-10-27 ENCOUNTER — Encounter: Payer: Self-pay | Admitting: Emergency Medicine

## 2019-10-27 ENCOUNTER — Emergency Department
Admission: EM | Admit: 2019-10-27 | Discharge: 2019-10-27 | Disposition: A | Discharge status: Home / Self Care | Payer: Medicaid Other | Attending: Emergency Medicine | Admitting: Emergency Medicine

## 2019-10-27 DIAGNOSIS — F17210 Nicotine dependence, cigarettes, uncomplicated: Secondary | ICD-10-CM | POA: Diagnosis not present

## 2019-10-27 DIAGNOSIS — Z89611 Acquired absence of right leg above knee: Secondary | ICD-10-CM | POA: Insufficient documentation

## 2019-10-27 DIAGNOSIS — R569 Unspecified convulsions: Secondary | ICD-10-CM | POA: Diagnosis present

## 2019-10-27 DIAGNOSIS — I10 Essential (primary) hypertension: Secondary | ICD-10-CM | POA: Diagnosis not present

## 2019-10-27 NOTE — ED Provider Notes (Signed)
[facility] Emergency Department Provider Note  ____________________________________________   First MD Initiated Contact with Patient 10/27/19 [phone]     (approximate)  I have reviewed the triage vital signs and the nursing notes.   HISTORY  Chief Complaint Seizures    HPI Joshua [name] is a 29 y.o. male with medical history as listed below who presents by EMS after an apparent seizure.  Reportedly he was at a convenience store and had a seizure that lasted about 45 seconds.   It is unclear if he was confused afterwards but he was awake and alert by the time he got to the emergency department.  He says that he has not had a seizure for an extended period of time and takes no medicines.  He denies drug use and said that he has been drinking some earlier today.  He is refusing all care including imaging and lab work.  He says he is fine and is ready to go.  The onset was acute and he admits that it was probably severe but says that he has been under a lot of stress recently, working hard, not sleeping much, I think that is what brought it on.  He says he is not concerned about it.        Past Medical History:  Diagnosis Date  . Acute renal failure (ARF) (HCC)    History of   . Aspiration pneumonia (HCC)    Hx of  . Asthma   . Bronchitis   . Cardiac arrest (HCC)    hx of  . Cardiogenic shock (HCC)    hx of  . Drug overdose   . Hernia cerebri (HCC)   . History of acute respiratory failure   . Hypertension   . Neuropathy   . Polysubstance abuse (HCC)   . Rhabdomyolysis   . Seizures [facility])     Patient Active Problem List   Diagnosis Date Noted  . Anxiety 10/21/2019  . Ear fullness, bilateral 10/21/2019  . History of seizures 10/21/2019  . MDD (major depressive disorder), single episode, moderate (HCC) 03/30/2019  . GAD (generalized anxiety disorder) 03/30/2019  . Tobacco use disorder 03/30/2019  . Cocaine use disorder, severe, in sustained  remission (HCC) 03/30/2019  . Opioid use disorder, moderate, in sustained remission (HCC) 03/30/2019  . Leg pain, anterior, left 12/06/2018  . 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 Impotence 12/08/2017  . Chronic midline low back pain without sciatica 12/08/2017  . Adjustment disorder with mixed anxiety and depressed mood 11/13/2017  . At risk for abuse of opiates 11/13/2017  . Acute kidney failure, unspecified (HCC) 11/02/2017  . Rhabdomyolysis 11/02/2017  . Acute respiratory failure (HCC) 11/02/2017  . Cardiogenic shock (HCC) 10/30/2017  . Cardiac arrest (HCC) 10/30/2017  . Drug abuse (HCC) 10/30/2017    Past Surgical History:  Procedure Laterality Date  . AMPUTATION Right 11/03/2017   Procedure: KNEE DISARTICULATION;  Surgeon: Renford Dills, MD;  Location: [facility] ORS;  Service: Vascular;  Laterality: Right;  . AMPUTATION Right 11/10/2017   Procedure: AMPUTATION ABOVE KNEE;  Surgeon: Renford Dills, MD;  Location: [facility] ORS;  Service: Vascular;  Laterality: Right;  . ankle/foot surgery with metal plates    . DIALYSIS/PERMA CATHETER INSERTION N/A 11/16/2017   Procedure: DIALYSIS/PERMA CATHETER INSERTION;  Surgeon: Annice Needy, MD;  Location: [facility] INVASIVE CV LAB;  Service: Cardiovascular;  Laterality: N/A;  . DIALYSIS/PERMA CATHETER REMOVAL N/A 12/14/2017   Procedure: DIALYSIS/PERMA  CATHETER REMOVAL;  Surgeon: Annice Needy, MD;  Location: [facility] INVASIVE CV LAB;  Service: Cardiovascular;  Laterality: N/A;  . permacath to right side     Scheduled to be removed    Prior to Admission medications   Medication Sig Start Date End Date Taking? Authorizing Provider  gabapentin (NEURONTIN) 300 MG capsule Take 1 capsule (300 mg total) by mouth 3 (three) times daily. 12/06/18   Georgiana Spinner, NP  Misc. Devices MISC Right leg Prosthetic. Dx: Right AKA 05/05/19   Hoy Register, MD  PROAIR HFA 108 (90 Base) MCG/ACT inhaler INHALE 2 PUFFS BY MOUTH EVERY 6 HOURS AS NEEDED  FOR SHORTNESS OF BREATH OR WHEEZING 05/16/19   [provider]    Allergies Codeine  Family History  Problem Relation Age of Onset  . Depression Mother   . Bipolar disorder Mother   . Varicose Veins Neg Hx     Social History Social History   Tobacco Use  . Smoking status: Current Every Day Smoker    Packs/day: 0.50    Types: Cigarettes  . Smokeless tobacco: Never Used  Substance Use Topics  . Alcohol use: No  . Drug use: Not Currently    Types: Cocaine, Methamphetamines    Review of Systems Constitutional: No fever/chills Eyes: No visual changes. ENT: No sore throat. Cardiovascular: Denies chest pain. Respiratory: Denies shortness of breath. Gastrointestinal: No abdominal pain.  No nausea, no vomiting.  No diarrhea.  No constipation. Genitourinary: Negative for dysuria. Musculoskeletal: Negative for neck pain.  Negative for back pain. Integumentary: Negative for rash. Neurological: Possible generalized tonic-clonic seizure.  Currently no focal numbness nor weakness.   ____________________________________________   PHYSICAL EXAM:  VITAL SIGNS: ED Triage Vitals  Enc Vitals Group     BP 10/27/19 0545 (!) 151/96     Pulse Rate 10/27/19 0545 97     Resp 10/27/19 0545 11     Temp 10/27/19 0545 99 F (37.2 C)     Temp Source 10/27/19 0545 Oral     SpO2 10/27/19 0536 99 %     Weight 10/27/19 0540 81.6 kg (180 lb)     Height 10/27/19 0540 1.778 m (5\' 10" )     Head Circumference --      Peak Flow --      Pain Score 10/27/19 0536 6     Pain Loc --      Pain Edu? --      Excl. in GC? --     Constitutional: Alert and oriented.  Disheveled but in no acute distress. Eyes: Conjunctivae are normal.  Head: Atraumatic. Nose: No congestion/rhinnorhea. Mouth/Throat: Patient is wearing a mask. Neck: No stridor.  No meningeal signs.   Cardiovascular: Normal rate, regular rhythm. Good peripheral circulation. Grossly normal heart sounds. Respiratory: Normal  respiratory effort.  No retractions. Gastrointestinal: Soft and nontender. No distention.  Musculoskeletal: Prior right-sided AKA with prosthetic leg.  No other gross deformities or abnormalities. Neurologic:  Normal speech and language. No gross focal neurologic deficits are appreciated.  Ambulating on his prosthetic leg without difficulty.  No tremor, good fine motor control. Skin:  Skin is warm, dry and intact. Psychiatric: Mood and affect are normal. Speech and behavior are normal.  Patient is very pleasant and appropriate in his interactions, calm and cooperative.  ____________________________________________   LABS (all labs ordered are listed, but only abnormal results are displayed)  Labs Reviewed - No data to display ____________________________________________  EKG  ED ECG REPORT I, 12/27/19  Karma Greaser, the attending physician, personally viewed and interpreted this ECG.  Date: 10/27/2019 EKG Time: 5:39 AM Rate: 107 Rhythm: Mild sinus tachycardia QRS Axis: normal Intervals: RSR' in V1-V2, most likely normal variant ST/T Wave abnormalities: normal Narrative Interpretation: no evidence of acute ischemia  ____________________________________________  RADIOLOGY I, Hinda Kehr, personally viewed and evaluated these images (plain radiographs) as part of my medical decision making, as well as reviewing the written report by the radiologist.  ED MD interpretation: Patient refusing work-up including imaging  Official radiology report(s): No results found.  ____________________________________________   PROCEDURES   Procedure(s) performed (including Critical Care):  Procedures   ____________________________________________   INITIAL IMPRESSION / MDM / ASSESSMENT AND PLAN / ED COURSE  As part of my medical decision making, I reviewed the following data within the [facility] notes reviewed and incorporated, Notes from prior ED visits and [place]  Controlled Substance Database  Differential diagnosis includes, but is not limited to, epilepsy, nonepileptic seizure activity, intracranial hemorrhage, neoplasm, drug and alcohol use, drug or alcohol withdrawal, electrolyte or metabolic abnormality, acute infection such as meningitis.  Patient is awake, alert, oriented, ambulatory, and has no evidence of any acute nor emergent medical issues at this time.  He is refusing all treatment and further evaluation.  He was observed in the ED for about an hour and 20 minutes before insisting that he be discharged.  At this point I believe that he has the capacity to make his own decisions.  He knows that he can come back at any time for further evaluation and he knows that he could have had a seizure or other serious medical condition but he says he is comfortable with following up as an outpatient.  I gave my usual and customary return precautions.   ____________________________________________  FINAL CLINICAL IMPRESSION(S) / ED DIAGNOSES  Final diagnoses:  Seizure-like activity ([place])     MEDICATIONS GIVEN DURING THIS VISIT:  Medications - No data to display   ED Discharge Orders    None      *Please note:  Joshua [name] was evaluated in Emergency Department on 10/27/2019 for the symptoms described in the history of present illness. He was evaluated in the context of the global COVID-19 pandemic, which necessitated consideration that the patient might be at risk for infection with the SARS-CoV-2 virus that causes COVID-19. Institutional protocols and algorithms that pertain to the evaluation of patients at risk for COVID-19 are in a state of rapid change based on information released by regulatory bodies including the CDC and federal and state organizations. These policies and algorithms were followed during the patient's care in the ED.  Some ED evaluations and interventions may be delayed as a result of limited staffing during the  pandemic.*  Note:  This document was prepared using Dragon voice recognition software and may include unintentional dictation errors.   Hinda Kehr, MD 10/27/19 [phone]

## 2019-10-27 NOTE — ED Notes (Signed)
This RN placed seizure pads on stretcher.

## 2019-10-27 NOTE — Discharge Instructions (Signed)
You declined any work-up for your seizure-like activity tonight.  It is certainly right to do so, but please know that she can return to the emergency department at any time for additional evaluation.  Please avoid drugs and alcohol and try to get plenty of sleep; as you pointed out, sleep deprivation can lead to seizure-like activity.  Please establish a primary care doctor and follow-up at your next available opportunity.

## 2019-10-27 NOTE — ED Triage Notes (Signed)
Pt arrived via [facility] for witnessed seizure at convenient store that lasted for about 45 seconds. Pt has hx of seizures but has not experienced one in a long time.

## 2019-10-27 NOTE — ED Notes (Signed)
Pt given ice water.

## 2019-10-27 NOTE — ED Notes (Signed)
Joshua Noe, RN attempted to insert IV and draw labs; however, pt refused and stated that he does not need an IV or his blood drawn. After attempting to talk to pt about reasoning behind inserting IV and drawing blood, pt still refused.

## 2019-10-31 ENCOUNTER — Telehealth: Payer: Self-pay | Admitting: General Practice

## 2019-10-31 NOTE — Telephone Encounter (Signed)
Yes orthopedics first and they will order PT if they feel that it is needed.

## 2019-10-31 NOTE — Telephone Encounter (Signed)
Patient would like to know when can he make an appt to start taking Psychial therapy. Please advise Cb- [phone]

## 2019-10-31 NOTE — Telephone Encounter (Signed)
Patient has been advised and will reach out to [facility]. KW

## 2019-10-31 NOTE — Telephone Encounter (Signed)
In your office note 10/21/19 U see we placed order for Ortho and for patient to see psychiatry but I didn't see you mention PT. Please review chart and advise. KW

## 2019-12-01 ENCOUNTER — Other Ambulatory Visit: Payer: Self-pay | Admitting: Adult Health

## 2019-12-01 DIAGNOSIS — Z89611 Acquired absence of right leg above knee: Secondary | ICD-10-CM

## 2019-12-01 NOTE — Telephone Encounter (Signed)
Have not seen this patient in the office  yet and he has not come in for lab work. No refills until labs and should schedule follow up in person.

## 2019-12-01 NOTE — Telephone Encounter (Signed)
I have not seen this patinet and he needs labs. See message below PEC was to inform patinet. NO refills until labs and he needs a office visit in person as well soon after. Lab orders are in and lab is walk in.     on, Harland Dingwall, CMA  [place] Nurse; Pec Nurse Triage Pool 2 hours ago (11:42 AM)     Attempted to contact patient, no answer or voicemail. Okay for PEC to advise patient.       Documentation   You  Inetha Maret Nurse 2 hours ago (11:26 AM)     Have not seen this patient in the office  yet and he has not come in for lab work. No refills until labs and should schedule follow up in person.

## 2019-12-01 NOTE — Telephone Encounter (Signed)
Requested medication (s) are due for refill today: yes  Requested medication (s) are on the active medication list: yes  Last refill:  12/06/18 historic provider  Future visit scheduled: No  Notes to clinic:  Last ordered by Sheppard Plumber NP    Requested Prescriptions  Pending Prescriptions Disp Refills   gabapentin (NEURONTIN) 300 MG capsule 90 capsule 3    Sig: Take 1 capsule (300 mg total) by mouth 3 (three) times daily.      Neurology: Anticonvulsants - gabapentin Passed - 12/01/2019 11:21 AM      Passed - Valid encounter within last 12 months    Recent Outpatient Visits           1 month ago S/P AKA (above knee amputation), right [facility])   [facility] Flinchum, Eula Fried, FNP

## 2019-12-01 NOTE — Telephone Encounter (Signed)
Medication Refill - Medication: gabapentin   Has the patient contacted their pharmacy? Yes.   (Agent: If no, request that the patient contact the pharmacy for the refill.) (Agent: If yes, when and what did the pharmacy advise?)  Preferred Pharmacy (with phone number or street name):  [id number] - [place], [place] - [address]  [address] [place] [address]  Phone: [phone] Fax: [phone]  Not a 24 hour pharmacy; exact hours not known.     Agent: Please be advised that RX refills may take up to 3 business days. We ask that you follow-up with your pharmacy.

## 2019-12-01 NOTE — Telephone Encounter (Signed)
Requested medication (s) are due for refill today: yes  Requested medication (s) are on the active medication list: yes  Last refill:  ?  Future visit scheduled: no  Notes to clinic:  historical provider    Requested Prescriptions  Pending Prescriptions Disp Refills   gabapentin (NEURONTIN) 300 MG capsule 90 capsule 3    Sig: Take 1 capsule (300 mg total) by mouth 3 (three) times daily.      Neurology: Anticonvulsants - gabapentin Passed - 12/01/2019 10:17 AM      Passed - Valid encounter within last 12 months    Recent Outpatient Visits           1 month ago S/P AKA (above knee amputation), right [facility])   [facility] Flinchum, Eula Fried, FNP

## 2019-12-01 NOTE — Telephone Encounter (Signed)
Attempted to contact patient, no answer or voicemail. Okay for PEC to advise patient.  

## 2019-12-02 NOTE — Telephone Encounter (Signed)
Pt called asking about his refill on the Gabapentin and if it was going to be filled stating he was completely out.  I looked for an appt with Elon Jester and could not find one until May 5.  I spoke with Georgiann Hahn who ask me to send a message stating that back to the office to see if he could be worked in sooner.  Pt's call back number is [phone]

## 2019-12-02 NOTE — Telephone Encounter (Signed)
Need labs and office visit prior to refills.

## 2019-12-02 NOTE — Telephone Encounter (Signed)
Just wanted to clarify so patient has an appointment on April 27th with you, do you want patient to have labs drawn ordered from February prior to refill ?KW

## 2019-12-02 NOTE — Telephone Encounter (Addendum)
Attempted to call patient and advise him of Michelle's message. Unable to reach the patient on phone number given. It gives a message of unavailable with no vm.

## 2019-12-02 NOTE — Telephone Encounter (Signed)
Spoke with the patient. He stated he had labs drawn by [facility] and is going to have them fax them over. He has an appointment scheduled 4/27 but stated he will need a refill before that appointment.

## 2019-12-05 ENCOUNTER — Telehealth: Payer: Self-pay

## 2019-12-05 NOTE — Telephone Encounter (Signed)
Copied from CRM [id number]. Topic: Referral - Request for Referral >> Dec 05, 2019  2:52 PM Leafy Ro wrote: Has patient seen PCP for this complaint? No pt was new pt in feb 2021. Pt said he mention to michelle at first visit. Pt has medicaid and would like a referral to [facility] in [place] phone number [phone]. Pt would like to see dr beshel for back pain. Pt is aware he may need to make an appt with michelle before she can do referral

## 2019-12-05 NOTE — Telephone Encounter (Signed)
Patient has upcoming appointment on 12/20/19 is it okay to put in referral request? OR do you want to wait to see patient to discuss? KW

## 2019-12-05 NOTE — Telephone Encounter (Signed)
Appointment first thanks

## 2019-12-07 NOTE — Telephone Encounter (Signed)
Pt  Stated he can not wait until 12/20/2019 for back pain. Pt has an appt on 12-09-2019 to address back issue

## 2019-12-08 NOTE — Progress Notes (Addendum)
Established patient visit     Patient: Joshua [name]   DOB: 1991-06-09   28 y.o. Male  MRN: [id number] Visit Date: 12/09/2019  Today's healthcare provider: Jairo Ben, FNP  Subjective:    Chief Complaint  Patient presents with  . Back Pain    follow up   HPI Back Pain  Patient returns back to office to address  chronic back pain. . The pain is located across the lower backwith radiation. It is described as sharp and stabbing, is severe in intensity, occurring constantly.   Denies any loss of bowel or bladder control.  Denies saddle paresthesias.  Denies radiculopathy/ paresthesias.  Aggravating factors: bending forwards, bending sideways and walking Relieving factors: patient states at times when Joshua [name] lays down Joshua [name] has some relief..  Joshua [name] has tried prescription pain relievers and physical thearpy with little relief.  Patient was seen at [facility] on 12/09/19. Patient goes to [facility] for his prosthetic  Discussion Note  The patient is diagnosed with a low back strain with possible radiculopathy on the right lower joint. The patient will be placed in physical therapy. The patient will followup with Dr. Leigh Aurora for reevaluation of his low back in 4 weeks. They can also discussed possible rehabilitation medicine changes versus ESI injections. Patient is instructed to bring his MRI and medical records with him to that visit. Diagnosis and treatment the patient. All questions answered today.   Patient denies drug use, and reports that Joshua [name] drinks occasionally. Patient reports that Joshua [name] has been smoking for 15 years two packs a day and in 2019 decreased to 1/2 pack.  No abdominal pain No  bowel incontinence  No  chest pain No  dysuria  No fever No headaches No joint pains Yes weakness in leg No pelvic pain No tingling in lower extremities No urinary incontinence No weight loss Patient reports that Joshua [name] would like to have Xanax for his anxiety, Joshua [name] has been referred to Dr.  Delena Serve in psychiatry at [facility] at his last video visit where Joshua [name] was supposed to come to the office.  Patient reports Joshua [name] has an appointment with Dr. Lynnea Ferrier in 1 week.  Joshua [name] denies any suicidal or homicidal thoughts or intents.  Patient  denies any fever, body aches,chills,  chest pain, shortness of breath, nausea, vomiting, or diarrhea.   -----------------------------------------------------------------------------------------      Medications: Outpatient Medications Prior to Visit  Medication Sig  . gabapentin (NEURONTIN) 300 MG capsule Take 1 capsule (300 mg total) by mouth 3 (three) times daily.  . Misc. Devices MISC Right leg Prosthetic. Dx: Right AKA  . [DISCONTINUED] PROAIR HFA 108 (90 Base) MCG/ACT inhaler INHALE 2 PUFFS BY MOUTH EVERY 6 HOURS AS NEEDED FOR SHORTNESS OF BREATH OR WHEEZING   No facility-administered medications prior to visit.    Review of Systems  Constitutional: Positive for fatigue. Negative for activity change, appetite change, chills, diaphoresis, fever and unexpected weight change.  HENT: Negative.   Respiratory: Negative.   Gastrointestinal: Negative.   Genitourinary: Negative.   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 Negative for joint swelling, neck pain and neck stiffness.  Hematological: Negative.   Psychiatric/Behavioral: Negative.        Objective:    BP 112/62   Pulse 89   Temp (!) 96.9 F (36.1 C) (Oral)   Resp 16   Ht 5\' 9"  (1.753 m) Comment: patient reports  Wt 176  lb 6.4 oz (80 kg)   SpO2 97%   BMI 26.05 kg/m    Physical Exam Vitals reviewed.  Constitutional:      General: Joshua [name] is not in acute distress.    Appearance: Normal appearance. Joshua [name] is normal weight. Joshua [name] is not ill-appearing, toxic-appearing or diaphoretic.  HENT:     Head: Normocephalic and atraumatic.     Right Ear: Tympanic membrane, ear canal and external ear normal. There  is no impacted cerumen.     Left Ear: Tympanic membrane, ear canal and external ear normal. There is no impacted cerumen.     Nose: Nose normal. No congestion or rhinorrhea.     Mouth/Throat:     Mouth: Mucous membranes are moist.     Pharynx: No oropharyngeal exudate or posterior oropharyngeal erythema.  Eyes:     Extraocular Movements: Extraocular movements intact.     Conjunctiva/sclera: Conjunctivae normal.     Pupils: Pupils are equal, round, and reactive to light.  Cardiovascular:     Rate and Rhythm: Normal rate and regular rhythm.     Pulses: Normal pulses.     Heart sounds: Normal heart sounds.  Pulmonary:     Effort: Pulmonary effort is normal. No respiratory distress.     Breath sounds: Normal breath sounds. No stridor. No decreased breath sounds, wheezing, rhonchi or rales.  Chest:     Chest wall: No tenderness.  Abdominal:     General: Bowel sounds are normal. There is no distension.     Palpations: Abdomen is soft. There is no mass.     Tenderness: There is no abdominal tenderness. There is no right CVA tenderness, left CVA tenderness, guarding or rebound.     Hernia: No hernia is present.  Genitourinary:    Comments: Patient declined recommended  GU exam. Musculoskeletal:        General: Normal range of motion.     Cervical back: Normal range of motion and neck supple.     Comments: Right leg prosthetic      Right Lower Extremity: Right leg is amputated below knee.  Skin:    General: Skin is warm and dry.     Capillary Refill: Capillary refill takes less than 2 seconds.     Nails: There is no clubbing.  Neurological:     General: No focal deficit present.     Mental Status: Joshua [name] is alert and oriented to person, place, and time. Mental status is at baseline.     GCS: GCS eye subscore is 4. GCS verbal subscore is 5. GCS motor subscore is 6.     Cranial Nerves: Cranial nerves are intact.     Sensory: Sensation is intact.     Motor: Motor function is intact.      Coordination: Impaired rapid alternating movements.     Gait: Gait abnormal.     Deep Tendon Reflexes: Reflexes are normal and symmetric.     Reflex Scores:      Tricep reflexes are 2+ on the right side and 2+ on the left side.      Brachioradialis reflexes are 2+ on the right side and 2+ on the left side.      Achilles reflexes are 2+ on the left side.    Comments: Right leg prosthesis is on and patient denies any skin breakdown or changes.  Psychiatric:        Attention and Perception: Attention and perception normal.        Mood and  Affect: Affect normal. Mood is anxious.        Speech: Speech normal.        Behavior: Behavior normal. Behavior is cooperative.        Thought Content: Thought content normal.        Cognition and Memory: Cognition and memory normal.        Judgment: Judgment normal.       No results found for any visits on 12/09/19.    Assessment & Plan:    Chronic midline low back pain without sciatica  Encounter for annual health examination  Skin lesion - Plan: Ambulatory referral to Dermatology  Seasonal allergies - Plan: PROAIR HFA 108 (90 Base) MCG/ACT inhaler  Anxiety  Cocaine use disorder, severe, in sustained remission (HCC)  Opioid use disorder, moderate, in sustained remission ([facility])  Screening for blood or protein in urine - Plan: Urinalysis  Patient will need to see psychiatry for his mental health, as patient has history of suicide attempt and concerned that patient could overdose on medications.  Patient is agreement and Joshua [name] will see Dr. Gilberto Better Joshua [name] was referred at last visit but says Joshua [name] has an appointment in a week to see him.  This is with [facility].  Patient has had cocaine use disorder and opioid use disorder Joshua [name] denies any drug use at this time.  Joshua [name] does request a ProAir inhaler for his seasonal allergies/asthma history Joshua [name] reports.  Joshua [name] is not using it more than once a week and mostly seasonal with increased pollen.  Joshua [name] is  advised that Joshua [name] needs to go for fasting labs Joshua [name], Joshua [name] has been advised this in the past and has not went yet.  Joshua [name] agrees that Joshua [name] will go for labs next week.  Lab orders were placed and patient was given a paper for the lab.  Smoking cessation is advised.  Alcohol cessation is advised.  Risk versus benefits were discussed. Return in about 3 months (around 03/09/2020), or if symptoms worsen or fail to improve, for . .    Meds ordered this encounter  Medications  . PROAIR HFA 108 (90 Base) MCG/ACT inhaler    Sig: Inhale 1-2 puffs into the lungs every 6 (six) hours as needed for wheezing or shortness of breath.    Dispense:  18 g    Refill:  3   Contact [facility] in regards to treatment plan and for referral for Chiropractor. Referral has been placed for dermatology, contact our office back in 10 days if you have not heard back from our office. Keep scheduled appointment with Dr. Gilberto Better at [facility].    Orders Placed This Encounter  Procedures  . Urinalysis  . Ambulatory referral to Dermatology    Referral Priority:   Routine    Referral Type:   Consultation    Referral Reason:   Specialty Services Required    Requested Specialty:   Dermatology    Number of Visits Requested:   1   Urine screening for bacteria and glucose was recommended today however patient is unable to give a urine sample.  The entirety of the information documented in the History of Present Illness, Review of Systems and Physical Exam were personally obtained by me. Portions of this information were initially documented by the  Certified Medical Assistant whose name is documented in [facility] and reviewed by me for thoroughness and accuracy.  I have personally performed the exam and reviewed the chart and it is accurate to the best  of my knowledge.  Museum/gallery conservator has been used and any errors in dictation or transcription are unintentional.  Eula Fried. Dorann Davidson FNP-C  [facility]  38 minutes spent reviewing chart and ordering referrals and with patient.   Jairo Ben, FNP  [facility] [phone] (phone) [phone] (fax)  [facility]

## 2019-12-08 NOTE — Telephone Encounter (Signed)
Please verify with patient - as patient was referred to [facility] [place] for his prosthesis and for back pain has gone yet, he will need to see orthopedics prior to chiropractor given his extensive history.

## 2019-12-09 ENCOUNTER — Ambulatory Visit (INDEPENDENT_AMBULATORY_CARE_PROVIDER_SITE_OTHER): Payer: Medicaid Other | Admitting: Adult Health

## 2019-12-09 ENCOUNTER — Encounter: Payer: Self-pay | Admitting: Adult Health

## 2019-12-09 ENCOUNTER — Other Ambulatory Visit: Payer: Self-pay

## 2019-12-09 ENCOUNTER — Telehealth: Payer: Self-pay | Admitting: Adult Health

## 2019-12-09 VITALS — BP 112/62 | HR 89 | Temp 96.9°F | Resp 16 | Ht 69.0 in | Wt 176.4 lb

## 2019-12-09 DIAGNOSIS — L989 Disorder of the skin and subcutaneous tissue, unspecified: Secondary | ICD-10-CM | POA: Insufficient documentation

## 2019-12-09 DIAGNOSIS — Z6826 Body mass index (BMI) 26.0-26.9, adult: Secondary | ICD-10-CM | POA: Insufficient documentation

## 2019-12-09 DIAGNOSIS — F1421 Cocaine dependence, in remission: Secondary | ICD-10-CM

## 2019-12-09 DIAGNOSIS — J302 Other seasonal allergic rhinitis: Secondary | ICD-10-CM

## 2019-12-09 DIAGNOSIS — G8929 Other chronic pain: Secondary | ICD-10-CM

## 2019-12-09 DIAGNOSIS — F419 Anxiety disorder, unspecified: Secondary | ICD-10-CM

## 2019-12-09 DIAGNOSIS — Z0000 Encounter for general adult medical examination without abnormal findings: Secondary | ICD-10-CM | POA: Insufficient documentation

## 2019-12-09 DIAGNOSIS — Z1389 Encounter for screening for other disorder: Secondary | ICD-10-CM | POA: Insufficient documentation

## 2019-12-09 DIAGNOSIS — M545 Low back pain: Secondary | ICD-10-CM | POA: Diagnosis not present

## 2019-12-09 DIAGNOSIS — F1121 Opioid dependence, in remission: Secondary | ICD-10-CM

## 2019-12-09 MED ORDER — PROAIR HFA 108 (90 BASE) MCG/ACT IN AERS: 1.0000 | INHALATION_SPRAY | Freq: Four times a day (QID) | RESPIRATORY_TRACT | 3 refills | Status: DC | PRN

## 2019-12-09 NOTE — Telephone Encounter (Signed)
Left message for patient to call office back, if he returns call triage nurse can advise as below. KW

## 2019-12-09 NOTE — Telephone Encounter (Signed)
Patient is scheduled office visit today. To address question from [facility] from note of 4/14 and 4/15.

## 2019-12-09 NOTE — Patient Instructions (Addendum)
Contact [facility] in regards to treatment plan and for referral for Chiropractor. Referral has been placed for dermatology, contact our office back in 10 days if you have not heard back from our office. Keep scheduled appointment with Dr. Melvenia Beam at [facility].   Health Maintenance, Male Adopting a healthy lifestyle and getting preventive care are important in promoting health and wellness. Ask your health care provider about:  The right schedule for you to have regular tests and exams.  Things you can do on your own to prevent diseases and keep yourself healthy. What should I know about diet, weight, and exercise? Eat a healthy diet   Eat a diet that includes plenty of vegetables, fruits, low-fat dairy products, and lean protein.  Do not eat a lot of foods that are high in solid fats, added sugars, or sodium. Maintain a healthy weight Body mass index (BMI) is a measurement that can be used to identify possible weight problems. It estimates body fat based on height and weight. Your health care provider can help determine your BMI and help you achieve or maintain a healthy weight. Get regular exercise Get regular exercise. This is one of the most important things you can do for your health. Most adults should:  Exercise for at least 150 minutes each week. The exercise should increase your heart rate and make you sweat (moderate-intensity exercise).  Do strengthening exercises at least twice a week. This is in addition to the moderate-intensity exercise.  Spend less time sitting. Even light physical activity can be beneficial. Watch cholesterol and blood lipids Have your blood tested for lipids and cholesterol at 31 years of age, then have this test every 5 years. You may need to have your cholesterol levels checked more often if:  Your lipid or cholesterol levels are high.  You are older than 31 years of age.  You are at high risk for heart disease. What should I know about  cancer screening? Many types of cancers can be detected early and may often be prevented. Depending on your health history and family history, you may need to have cancer screening at various ages. This may include screening for:  Colorectal cancer.  Prostate cancer.  Skin cancer.  Lung cancer. What should I know about heart disease, diabetes, and high blood pressure? Blood pressure and heart disease  High blood pressure causes heart disease and increases the risk of stroke. This is more likely to develop in people who have high blood pressure readings, are of African descent, or are overweight.  Talk with your health care provider about your target blood pressure readings.  Have your blood pressure checked: ? Every 3-5 years if you are 48-11 years of age. ? Every year if you are 19 years old or older.  If you are between the ages of 39 and 38 and are a current or former smoker, ask your health care provider if you should have a one-time screening for abdominal aortic aneurysm (AAA). Diabetes Have regular diabetes screenings. This checks your fasting blood sugar level. Have the screening done:  Once every three years after age 18 if you are at a normal weight and have a low risk for diabetes.  More often and at a younger age if you are overweight or have a high risk for diabetes. What should I know about preventing infection? Hepatitis B If you have a higher risk for hepatitis B, you should be screened for this virus. Talk with your health care provider  to find out if you are at risk for hepatitis B infection. Hepatitis C Blood testing is recommended for:  Everyone born from [birth year] through 1967.  Anyone with known risk factors for hepatitis C. Sexually transmitted infections (STIs)  You should be screened each year for STIs, including gonorrhea and chlamydia, if: ? You are sexually active and are younger than 31 years of age. ? You are older than 31 years of age and your health  care provider tells you that you are at risk for this type of infection. ? Your sexual activity has changed since you were last screened, and you are at increased risk for chlamydia or gonorrhea. Ask your health care provider if you are at risk.  Ask your health care provider about whether you are at high risk for HIV. Your health care provider may recommend a prescription medicine to help prevent HIV infection. If you choose to take medicine to prevent HIV, you should first get tested for HIV. You should then be tested every 3 months for as long as you are taking the medicine. Follow these instructions at home: Lifestyle  Do not use any products that contain nicotine or tobacco, such as cigarettes, e-cigarettes, and chewing tobacco. If you need help quitting, ask your health care provider.  Do not use street drugs.  Do not share needles.  Ask your health care provider for help if you need support or information about quitting drugs. Alcohol use  Do not drink alcohol if your health care provider tells you not to drink.  If you drink alcohol: ? Limit how much you have to 0-2 drinks a day. ? Be aware of how much alcohol is in your drink. In the [place] one drink equals one 12 oz bottle of beer (355 mL), one 5 oz glass of wine (148 mL), or one 1 oz glass of hard liquor (44 mL). General instructions  Schedule regular health, dental, and eye exams.  Stay current with your vaccines.  Tell your health care provider if: ? You often feel depressed. ? You have ever been abused or do not feel safe at home. Summary  Adopting a healthy lifestyle and getting preventive care are important in promoting health and wellness.  Follow your health care provider's instructions about healthy diet, exercising, and getting tested or screened for diseases.  Follow your health care provider's instructions on monitoring your cholesterol and blood pressure. This information is not intended to replace advice  given to you by your health care provider. Make sure you discuss any questions you have with your health care provider. Document Revised: 08/04/2018 Document Reviewed: 08/04/2018 Elsevier Patient Education  2020 Else[employer] Inc.  Chronic Back Pain When back pain lasts longer than 3 months, it is called chronic back pain. Pain may get worse at certain times (flare-ups). There are things you can do at home to manage your pain. Follow these instructions at home: Activity      Avoid bending and other activities that make pain worse.  When standing: ? Keep your upper back and neck straight. ? Keep your shoulders pulled back. ? Avoid slouching.  When sitting: ? Keep your back straight. ? Relax your shoulders. Do not round your shoulders or pull them backward.  Do not sit or stand in one place for long periods of time.  Take short rest breaks during the day. Lying down or standing is usually better than sitting. Resting can help relieve pain.  When sitting or lying down for a  long time, do some mild activity or stretching. This will help to prevent stiffness and pain.  Get regular exercise. Ask your doctor what activities are safe for you.  Do not lift anything that is heavier than 10 lb (4.5 kg). To prevent injury when you lift things: ? Bend your knees. ? Keep the weight close to your body. ? Avoid twisting. Managing pain  If told, put ice on the painful area. Your doctor may tell you to use ice for 24-48 hours after a flare-up starts. ? Put ice in a plastic bag. ? Place a towel between your skin and the bag. ? Leave the ice on for 20 minutes, 2-3 times a day.  If told, put heat on the painful area as often as told by your doctor. Use the heat source that your doctor recommends, such as a moist heat pack or a heating pad. ? Place a towel between your skin and the heat source. ? Leave the heat on for 20-30 minutes. ? Remove the heat if your skin turns bright red. This is  especially important if you are unable to feel pain, heat, or cold. You may have a greater risk of getting burned.  Soak in a warm bath. This can help relieve pain.  Take over-the-counter and prescription medicines only as told by your doctor. General instructions  Sleep on a firm mattress. Try lying on your side with your knees slightly bent. If you lie on your back, put a pillow under your knees.  Keep all follow-up visits as told by your doctor. This is important. Contact a doctor if:  You have pain that does not get better with rest or medicine. Get help right away if:  One or both of your arms or legs feel weak.  One or both of your arms or legs lose feeling (numbness).  You have trouble controlling when you poop (bowel movement) or pee (urinate).  You feel sick to your stomach (nauseous).  You throw up (vomit).  You have belly (abdominal) pain.  You have shortness of breath.  You pass out (faint). Summary  When back pain lasts longer than 3 months, it is called chronic back pain.  Pain may get worse at certain times (flare-ups).  Use ice and heat as told by your doctor. Your doctor may tell you to use ice after flare-ups. This information is not intended to replace advice given to you by your health care provider. Make sure you discuss any questions you have with your health care provider. Document Revised: 12/02/2018 Document Reviewed: 03/26/2017 Elsevier Patient Education  2020 [place] With Depression Everyone experiences occasional disappointment, sadness, and loss in their lives. When you are feeling down, blue, or sad for at least 2 weeks in a row, it may mean that you have depression. Depression can affect your thoughts and feelings, relationships, daily activities, and physical health. It is caused by changes in the way your brain functions. If you receive a diagnosis of depression, your health care provider will tell you which type of depression  you have and what treatment options are available to you. If you are living with depression, there are ways to help you recover from it and also ways to prevent it from coming back. How to cope with lifestyle changes Coping with stress     Stress is your body's reaction to life changes and events, both good and bad. Stressful situations may include:  Getting married.  The death of a  spouse.  Losing a job.  Retiring.  Having a baby. Stress can last just a few hours or it can be ongoing. Stress can play a major role in depression, so it is important to learn both how to cope with stress and how to think about it differently. Talk with your health care provider or a counselor if you would like to learn more about stress reduction. He or she may suggest some stress reduction techniques, such as:  Music therapy. This can include creating music or listening to music. Choose music that you enjoy and that inspires you.  Mindfulness-based meditation. This kind of meditation can be done while sitting or walking. It involves being aware of your normal breaths, rather than trying to control your breathing.  Centering prayer. This is a kind of meditation that involves focusing on a spiritual word or phrase. Choose a word, phrase, or sacred image that is meaningful to you and that brings you peace.  Deep breathing. To do this, expand your stomach and inhale slowly through your nose. Hold your breath for 3-5 seconds, then exhale slowly, allowing your stomach muscles to relax.  Muscle relaxation. This involves intentionally tensing muscles then relaxing them. Choose a stress reduction technique that fits your lifestyle and personality. Stress reduction techniques take time and practice to develop. Set aside 5-15 minutes a day to do them. Therapists can offer training in these techniques. The training may be covered by some insurance plans. Other things you can do to manage stress include:  Keeping a  stress diary. This can help you learn what triggers your stress and ways to control your response.  Understanding what your limits are and saying no to requests or events that lead to a schedule that is too full.  Thinking about how you respond to certain situations. You may not be able to control everything, but you can control how you react.  Adding humor to your life by watching funny films or TV shows.  Making time for activities that help you relax and not feeling guilty about spending your time this way.  Medicines Your health care provider may suggest certain medicines if he or she feels that they will help improve your condition. Avoid using alcohol and other substances that may prevent your medicines from working properly (may interact). It is also important to:  Talk with your pharmacist or health care provider about all the medicines that you take, their possible side effects, and what medicines are safe to take together.  Make it your goal to take part in all treatment decisions (shared decision-making). This includes giving input on the side effects of medicines. It is best if shared decision-making with your health care provider is part of your total treatment plan. If your health care provider prescribes a medicine, you may not notice the full benefits of it for 4-8 weeks. Most people who are treated for depression need to be on medicine for at least 6-12 months after they feel better. If you are taking medicines as part of your treatment, do not stop taking medicines without first talking to your health care provider. You may need to have the medicine slowly decreased (tapered) over time to decrease the risk of harmful side effects. Relationships Your health care provider may suggest family therapy along with individual therapy and drug therapy. While there may not be family problems that are causing you to feel depressed, it is still important to make sure your family learns as  much as  they can about your mental health. Having your family's support can help make your treatment successful. How to recognize changes in your condition Everyone has a different response to treatment for depression. Recovery from major depression happens when you have not had signs of major depression for two months. This may mean that you will start to:  Have more interest in doing activities.  Feel less hopeless than you did 2 months ago.  Have more energy.  Overeat less often, or have better or improving appetite.  Have better concentration. Your health care provider will work with you to decide the next steps in your recovery. It is also important to recognize when your condition is getting worse. Watch for these signs:  Having fatigue or low energy.  Eating too much or too little.  Sleeping too much or too little.  Feeling restless, agitated, or hopeless.  Having trouble concentrating or making decisions.  Having unexplained physical complaints.  Feeling irritable, angry, or aggressive. Get help as soon as you or your family members notice these symptoms coming back. How to get support and help from others How to talk with friends and family members about your condition  Talking to friends and family members about your condition can provide you with one way to get support and guidance. Reach out to trusted friends or family members, explain your symptoms to them, and let them know that you are working with a health care provider to treat your depression. Financial resources Not all insurance plans cover mental health care, so it is important to check with your insurance carrier. If paying for co-pays or counseling services is a problem, search for a local or county mental health care center. They may be able to offer public mental health care services at low or no cost when you are not able to see a private health care provider. If you are taking medicine for depression,  you may be able to get the generic form, which may be less expensive. Some makers of prescription medicines also offer help to patients who cannot afford the medicines they need. Follow these instructions at home:   Get the right amount and quality of sleep.  Cut down on using caffeine, tobacco, alcohol, and other potentially harmful substances.  Try to exercise, such as walking or lifting small weights.  Take over-the-counter and prescription medicines only as told by your health care provider.  Eat a healthy diet that includes plenty of vegetables, fruits, whole grains, low-fat dairy products, and lean protein. Do not eat a lot of foods that are high in solid fats, added sugars, or salt.  Keep all follow-up visits as told by your health care provider. This is important. Contact a health care provider if:  You stop taking your antidepressant medicines, and you have any of these symptoms: ? Nausea. ? Headache. ? Feeling lightheaded. ? Chills and body aches. ? Not being able to sleep (insomnia).  You or your friends and family think your depression is getting worse. Get help right away if:  You have thoughts of hurting yourself or others. If you ever feel like you may hurt yourself or others, or have thoughts about taking your own life, get help right away. You can go to your nearest emergency department or call:  Your local emergency services (911 in the [place]  A suicide crisis helpline, such as the National Suicide Prevention Lifeline at 639-005-1860. This is open 24-hours a day. Summary  If you are living with depression,  there are ways to help you recover from it and also ways to prevent it from coming back.  Work with your health care team to create a management plan that includes counseling, stress management techniques, and healthy lifestyle habits. This information is not intended to replace advice given to you by your health care provider. Make sure you discuss any  questions you have with your health care provider. Document Revised: 12/03/2018 Document Reviewed: 07/14/2016 Elsevier Patient Education  2020 Elsevier Inc.   Fat and Cholesterol Restricted Eating Plan Getting too much fat and cholesterol in your diet may cause health problems. Choosing the right foods helps keep your fat and cholesterol at normal levels. This can keep you from getting certain diseases. Your doctor may recommend an eating plan that includes:  Total fat: ______% or less of total calories a day.  Saturated fat: ______% or less of total calories a day.  Cholesterol: less than _________mg a day.  Fiber: ______g a day. What are tips for following this plan? Meal planning  At meals, divide your plate into four equal parts: ? Fill one-half of your plate with vegetables and green salads. ? Fill one-fourth of your plate with whole grains. ? Fill one-fourth of your plate with low-fat (lean) protein foods.  Eat fish that is high in omega-3 fats at least two times a week. This includes mackerel, tuna, sardines, and salmon.  Eat foods that are high in fiber, such as whole grains, beans, apples, broccoli, carrots, peas, and barley. General tips   Work with your doctor to lose weight if you need to.  Avoid: ? Foods with added sugar. ? Fried foods. ? Foods with partially hydrogenated oils.  Limit alcohol intake to no more than 1 drink a day for nonpregnant women and 2 drinks a day for men. One drink equals 12 oz of beer, 5 oz of wine, or 1 oz of hard liquor. Reading food labels  Check food labels for: ? Trans fats. ? Partially hydrogenated oils. ? Saturated fat (g) in each serving. ? Cholesterol (mg) in each serving. ? Fiber (g) in each serving.  Choose foods with healthy fats, such as: ? Monounsaturated fats. ? Polyunsaturated fats. ? Omega-3 fats.  Choose grain products that have whole grains. Look for the word "whole" as the first word in the ingredient  list. Cooking  Cook foods using low-fat methods. These include baking, boiling, grilling, and broiling.  Eat more home-cooked foods. Eat at restaurants and buffets less often.  Avoid cooking using saturated fats, such as butter, cream, palm oil, palm kernel oil, and coconut oil. Recommended foods  Fruits  All fresh, canned (in natural juice), or frozen fruits. Vegetables  Fresh or frozen vegetables (raw, steamed, roasted, or grilled). Green salads. Grains  Whole grains, such as whole wheat or whole grain breads, crackers, cereals, and pasta. Unsweetened oatmeal, bulgur, barley, quinoa, or brown rice. Corn or whole wheat flour tortillas. Meats and other protein foods  Ground beef (85% or leaner), grass-fed beef, or beef trimmed of fat. Skinless chicken or [place]. Ground chicken or [place]. Pork trimmed of fat. All fish and seafood. Egg whites. Dried beans, peas, or lentils. Unsalted nuts or seeds. Unsalted canned beans. Nut butters without added sugar or oil. Dairy  Low-fat or nonfat dairy products, such as skim or 1% milk, 2% or reduced-fat cheeses, low-fat and fat-free ricotta or cottage cheese, or plain low-fat and nonfat yogurt. Fats and oils  Tub margarine without trans fats. Light or reduced-fat  mayonnaise and salad dressings. Avocado. Olive, canola, sesame, or safflower oils. The items listed above may not be a complete list of foods and beverages you can eat. Contact a dietitian for more information. Foods to avoid Fruits  Canned fruit in heavy syrup. Fruit in cream or butter sauce. Fried fruit. Vegetables  Vegetables cooked in cheese, cream, or butter sauce. Fried vegetables. Grains  White bread. White pasta. White rice. Cornbread. Bagels, pastries, and croissants. Crackers and snack foods that contain trans fat and hydrogenated oils. Meats and other protein foods  Fatty cuts of meat. Ribs, chicken wings, bacon, sausage, bologna, salami, chitterlings, fatback, hot  dogs, bratwurst, and packaged lunch meats. Liver and organ meats. Whole eggs and egg yolks. Chicken and [place] with skin. Fried meat. Dairy  Whole or 2% milk, cream, half-and-half, and cream cheese. Whole milk cheeses. Whole-fat or sweetened yogurt. Full-fat cheeses. Nondairy creamers and whipped toppings. Processed cheese, cheese spreads, and cheese curds. Beverages  Alcohol. Sugar-sweetened drinks such as sodas, lemonade, and fruit drinks. Fats and oils  Butter, stick margarine, lard, shortening, ghee, or bacon fat. Coconut, palm kernel, and palm oils. Sweets and desserts  Corn syrup, sugars, honey, and molasses. Candy. Jam and jelly. Syrup. Sweetened cereals. Cookies, pies, cakes, donuts, muffins, and ice cream. The items listed above may not be a complete list of foods and beverages you should avoid. Contact a dietitian for more information. Summary  Choosing the right foods helps keep your fat and cholesterol at normal levels. This can keep you from getting certain diseases.  At meals, fill one-half of your plate with vegetables and green salads.  Eat high-fiber foods, like whole grains, beans, apples, carrots, peas, and barley.  Limit added sugar, saturated fats, alcohol, and fried foods. This information is not intended to replace advice given to you by your health care provider. Make sure you discuss any questions you have with your health care provider. Document Revised: 04/14/2018 Document Reviewed: 04/28/2017 Elsevier Patient Education  2020 [employer].

## 2019-12-14 NOTE — Telephone Encounter (Signed)
Addressed at office visit 12/09/19. KW

## 2019-12-14 NOTE — Progress Notes (Deleted)
    Established patient visit    Patient: Joshua [name]   DOB: 1990/11/21   29 y.o. Male  MRN: [id number] Visit Date: 12/14/2019  Today's healthcare provider: Jairo Ben, FNP   No chief complaint on file.  Subjective    HPI ***  {Show patient history (optional):23778::" "}   Medications: Outpatient Medications Prior to Visit  Medication Sig  . gabapentin (NEURONTIN) 300 MG capsule Take 1 capsule (300 mg total) by mouth 3 (three) times daily.  . Misc. Devices MISC Right leg Prosthetic. Dx: Right AKA  . PROAIR HFA 108 (90 Base) MCG/ACT inhaler Inhale 1-2 puffs into the lungs every 6 (six) hours as needed for wheezing or shortness of breath.   No facility-administered medications prior to visit.    Review of Systems  {Show previous labs (optional):23779::" "}   Objective    There were no vitals taken for this visit. {Show previous vital signs (optional):23777::" "}  Physical Exam  ***  No results found for any visits on 12/20/19.   Assessment & Plan    ***  No follow-ups on file.      {provider attestation***:1}   Jairo Ben, FNP  [facility] [phone] (phone) [phone] (fax)  [facility]

## 2019-12-19 DIAGNOSIS — Z89619 Acquired absence of unspecified leg above knee: Secondary | ICD-10-CM | POA: Insufficient documentation

## 2019-12-20 ENCOUNTER — Ambulatory Visit: Payer: Self-pay | Admitting: Adult Health

## 2019-12-21 ENCOUNTER — Telehealth: Payer: Self-pay

## 2019-12-21 DIAGNOSIS — G8929 Other chronic pain: Secondary | ICD-10-CM

## 2019-12-21 NOTE — Telephone Encounter (Signed)
No he was sent to Dermatology for skin concern, it was not a urological issue reported during the visit. He declined exam.

## 2019-12-21 NOTE — Telephone Encounter (Signed)
Please advise since patient has missed his appointments. KW

## 2019-12-21 NOTE — Telephone Encounter (Signed)
Copied from CRM [id number]. Topic: General - Other >> Dec 21, 2019  2:19 PM Dalphine Handing A wrote: Patient is requesting a callback in regards to his urology referral. Patient wants to know the name of the urology office he was referred to. Please advise

## 2019-12-21 NOTE — Telephone Encounter (Signed)
Copied from CRM [id number]. Topic: General - Inquiry >> Dec 21, 2019  1:47 PM Leary Roca wrote: Reason for CRM: patient is needing a referral for an ortho appt . Please advise

## 2019-12-21 NOTE — Telephone Encounter (Signed)
He was advised at last visit to see orthopedics.  We have sent him to [facility] and he has been seen there, is he saying he needs another referral ?

## 2019-12-21 NOTE — Telephone Encounter (Signed)
Can you please clarify for me because I dont see a order for urology in chart, last urology Im seeing for referral was in 2019. KW

## 2019-12-21 NOTE — Telephone Encounter (Signed)
Lmtcb-KW 

## 2019-12-22 NOTE — Telephone Encounter (Signed)
Patient has been advised. KW 

## 2019-12-22 NOTE — Telephone Encounter (Signed)
Note from office visit at  [facility] reviewed as below. Ok to send chiropractic refferal for evaluation as given his extensive history provider had advised patient that orthopedics would need to decide if this was appropriate. Ok with refferal as he needs from PCP due to medicaid he reports. He should keep follow ups with orthopedics and report new or changing symptoms.   Also let him know he was reffered to dermatology for " area on his testicle " that he declined exam in our office for. He had no reports of urological sympoms and was not reffered to urology.   Advise patient call the office or your primary care doctor for an appointment if no improvement within 72 hours or if any symptoms change or worsen at any time  Advised ER or urgent Care if after hours or on weekend. Call 911 for emergency symptoms at any time.Patinet verbalized understanding of all instructions given/reviewed and treatment plan and has no further questions or concerns at this time.         Assessment and Plan  1. Low back pain   back care and preventing injuries: care instructions   getting back to normal after low back pain: care instructions   learning about relief for back pain   chiropractic referral   2. Amputee - limb   misc prosthetic    Discussion Note  The the patient continued to have low back pain and will be sent for chiropractic treatment to progress to a home exercise program. He will continue conservative treatment.   The patient is a right transfemoral amputation. The patient does not have comorbidities that will impact his mobility or his ability to function with a prosthesis. Patient verbally communicate a strong desire to get a new prosthetic socket. Patient is not currently using any mobility any period patient current prosthetic fit is poor due to volume loss and residual limb. Patient is a K3 level ambulator that spends a lot of time walking around on uneven terrain or obstacles, up and down  stairs, and sometimes has to walk backwards. The patient will benefit from a socket replacement.  Note from Altamese Cabal at [facility] [employer].

## 2019-12-23 NOTE — Telephone Encounter (Signed)
Attempted to contact patient referral was put in this morning. KW

## 2019-12-23 NOTE — Telephone Encounter (Addendum)
Pt stated he needs a referral sent to [facility]. Stated he has to have it from PCP. Pt has appt on Monday. FAX: [phone] Pt would like a call back to know if it was sent or not.

## 2019-12-23 NOTE — Addendum Note (Signed)
Addended by: Fonda Kinder on: 12/23/2019 09:55 AM   Modules accepted: Orders

## 2020-03-08 ENCOUNTER — Ambulatory Visit: Payer: Medicaid Other | Admitting: Dermatology

## 2020-03-29 ENCOUNTER — Emergency Department
Admission: EM | Admit: 2020-03-29 | Discharge: 2020-03-29 | Disposition: A | Discharge status: Home / Self Care | Payer: Medicaid Other | Attending: Emergency Medicine | Admitting: Emergency Medicine

## 2020-03-29 ENCOUNTER — Emergency Department: Payer: Medicaid Other

## 2020-03-29 ENCOUNTER — Encounter: Payer: Self-pay | Admitting: Emergency Medicine

## 2020-03-29 DIAGNOSIS — Y999 Unspecified external cause status: Secondary | ICD-10-CM | POA: Insufficient documentation

## 2020-03-29 DIAGNOSIS — F17210 Nicotine dependence, cigarettes, uncomplicated: Secondary | ICD-10-CM | POA: Diagnosis not present

## 2020-03-29 DIAGNOSIS — Y92410 Unspecified street and highway as the place of occurrence of the external cause: Secondary | ICD-10-CM | POA: Diagnosis not present

## 2020-03-29 DIAGNOSIS — Y939 Activity, unspecified: Secondary | ICD-10-CM | POA: Insufficient documentation

## 2020-03-29 DIAGNOSIS — M542 Cervicalgia: Secondary | ICD-10-CM | POA: Diagnosis not present

## 2020-03-29 DIAGNOSIS — M25512 Pain in left shoulder: Secondary | ICD-10-CM | POA: Diagnosis present

## 2020-03-29 DIAGNOSIS — I10 Essential (primary) hypertension: Secondary | ICD-10-CM | POA: Diagnosis not present

## 2020-03-29 DIAGNOSIS — J45909 Unspecified asthma, uncomplicated: Secondary | ICD-10-CM | POA: Insufficient documentation

## 2020-03-29 MED ORDER — METHOCARBAMOL 500 MG PO TABS: 500.0000 mg | ORAL_TABLET | Freq: Three times a day (TID) | ORAL | 0 refills | Status: AC | PRN

## 2020-03-29 MED ORDER — METHOCARBAMOL 500 MG PO TABS: 500.0000 mg | ORAL_TABLET | Freq: Three times a day (TID) | ORAL | 0 refills | Status: DC | PRN
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
## 2020-07-14 ENCOUNTER — Emergency Department: Payer: Medicaid Other

## 2020-07-14 ENCOUNTER — Emergency Department
Admission: EM | Admit: 2020-07-14 | Discharge: 2020-07-14 | Disposition: A | Discharge status: Home / Self Care | Payer: Medicaid Other | Attending: Emergency Medicine | Admitting: Emergency Medicine

## 2020-07-14 ENCOUNTER — Other Ambulatory Visit: Payer: Self-pay

## 2020-07-14 DIAGNOSIS — Y92410 Unspecified street and highway as the place of occurrence of the external cause: Secondary | ICD-10-CM | POA: Diagnosis not present

## 2020-07-14 DIAGNOSIS — R0781 Pleurodynia: Secondary | ICD-10-CM | POA: Diagnosis not present

## 2020-07-14 DIAGNOSIS — I10 Essential (primary) hypertension: Secondary | ICD-10-CM | POA: Diagnosis not present

## 2020-07-14 DIAGNOSIS — M7918 Myalgia, other site: Secondary | ICD-10-CM

## 2020-07-14 DIAGNOSIS — J45909 Unspecified asthma, uncomplicated: Secondary | ICD-10-CM | POA: Insufficient documentation

## 2020-07-14 DIAGNOSIS — F17210 Nicotine dependence, cigarettes, uncomplicated: Secondary | ICD-10-CM | POA: Diagnosis not present

## 2020-07-14 DIAGNOSIS — M545 Low back pain, unspecified: Secondary | ICD-10-CM | POA: Diagnosis present

## 2020-07-14 DIAGNOSIS — M546 Pain in thoracic spine: Secondary | ICD-10-CM | POA: Diagnosis not present

## 2020-07-14 DIAGNOSIS — M791 Myalgia, unspecified site: Secondary | ICD-10-CM | POA: Diagnosis not present

## 2020-07-14 MED ORDER — METHOCARBAMOL 500 MG PO TABS
750.0000 mg | ORAL_TABLET | Freq: Once | ORAL | Status: AC
  Administered 2020-07-14: 750 mg via ORAL
  Filled 2020-07-14: qty 2

## 2020-07-14 MED ORDER — METHOCARBAMOL 500 MG PO TABS: 500.0000 mg | ORAL_TABLET | Freq: Four times a day (QID) | ORAL | 0 refills | Status: DC | PRN

## 2020-07-14 NOTE — ED Notes (Signed)
Pt given phone to call his wife per his request
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
## 2020-08-27 ENCOUNTER — Other Ambulatory Visit: Payer: Self-pay | Admitting: Neurosurgery

## 2020-09-28 ENCOUNTER — Ambulatory Visit: Admit: 2020-09-28 | Payer: No Typology Code available for payment source | Admitting: Neurosurgery

## 2020-09-28 SURGERY — ANTERIOR CERVICAL DECOMPRESSION/DISCECTOMY FUSION 1 LEVEL
Anesthesia: General

## 2020-10-11 ENCOUNTER — Ambulatory Visit: Admit: 2020-10-11 | Payer: No Typology Code available for payment source | Admitting: Neurosurgery

## 2020-10-11 SURGERY — ANTERIOR CERVICAL DECOMPRESSION/DISCECTOMY FUSION 1 LEVEL
Anesthesia: General

## 2020-10-25 ENCOUNTER — Emergency Department: Payer: Medicare Other

## 2020-10-25 ENCOUNTER — Other Ambulatory Visit: Payer: Self-pay

## 2020-10-25 ENCOUNTER — Emergency Department
Admission: EM | Admit: 2020-10-25 | Discharge: 2020-10-25 | Discharge status: Against Medical Advice | Payer: Medicare Other | Attending: Emergency Medicine | Admitting: Emergency Medicine

## 2020-10-25 DIAGNOSIS — F17210 Nicotine dependence, cigarettes, uncomplicated: Secondary | ICD-10-CM | POA: Insufficient documentation

## 2020-10-25 DIAGNOSIS — R0602 Shortness of breath: Secondary | ICD-10-CM | POA: Insufficient documentation

## 2020-10-25 DIAGNOSIS — J45909 Unspecified asthma, uncomplicated: Secondary | ICD-10-CM | POA: Insufficient documentation

## 2020-10-25 DIAGNOSIS — R059 Cough, unspecified: Secondary | ICD-10-CM | POA: Diagnosis not present

## 2020-10-25 DIAGNOSIS — R093 Abnormal sputum: Secondary | ICD-10-CM | POA: Insufficient documentation

## 2020-10-25 DIAGNOSIS — I10 Essential (primary) hypertension: Secondary | ICD-10-CM | POA: Diagnosis not present

## 2020-10-25 MED ORDER — LAMOTRIGINE 25 MG PO TABS: 25.0000 mg | ORAL_TABLET | Freq: Every day | ORAL | 3 refills | Status: DC

## 2020-10-25 MED ORDER — ALBUTEROL SULFATE HFA 108 (90 BASE) MCG/ACT IN AERS
2.0000 | INHALATION_SPRAY | Freq: Four times a day (QID) | RESPIRATORY_TRACT | 2 refills | Status: DC | PRN
Start: 2020-10-25 — End: 2021-08-27

## 2020-10-25 NOTE — ED Provider Notes (Signed)
[facility] Emergency Department Provider Note  ____________________________________________   Event Date/Time   First MD Initiated Contact with Patient 10/25/20 [phone]     (approximate)  I have reviewed the triage vital signs and the nursing notes.   HISTORY  Chief Complaint Cough    HPI Joshua [name] is a 30 y.o. male presents emergency department  with concerns of cough and shortness of breath.  Patient states that he has had a cough for 2 to 3 days.  Did have a cough around the time he had surgery and they had given him a steroid to help with his shortness of breath.  States he has continued to cough but has worsened over the past 3 days.  Mild shortness of breath he feels associated with cough.  Feels that he is coughing up yellow stuff.  However this could be due to smoking.  Patient is sedentary.  He is also very depressed but not suicidal or homicidal.  Patient states he is out of his Lamictal.  Does not have a regular doctor at this time.  Patient had surgery February 4 for C-spine problems   Past Medical History:  Diagnosis Date  . Acute renal failure (ARF) (HCC)    History of   . Aspiration pneumonia (HCC)    Hx of  . Asthma   . Bronchitis   . Cardiac arrest (HCC)    hx of  . Cardiogenic shock (HCC)    hx of  . Drug overdose   . Hernia cerebri (HCC)   . History of acute respiratory failure   . Hypertension   . Neuropathy   . Polysubstance abuse (HCC)   . Rhabdomyolysis   . Seizures [facility])     Patient Active Problem List   Diagnosis Date Noted  . Seasonal allergies 12/09/2019  . Skin lesion 12/09/2019  . Screening for blood or protein in urine 12/09/2019  . Body mass index 26.0-26.9, adult 12/09/2019  . Anxiety 10/21/2019  . Ear fullness, bilateral 10/21/2019  . History of seizures 10/21/2019  . MDD (major depressive disorder), single episode, moderate (HCC) 03/30/2019  . GAD (generalized anxiety disorder) 03/30/2019  . Tobacco use  disorder 03/30/2019  . Cocaine use disorder, severe, in sustained remission (HCC) 03/30/2019  . Opioid use disorder, moderate, in sustained remission (HCC) 03/30/2019  . Leg pain, anterior, left 12/06/2018  . 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 Impotence 12/08/2017  . Low back pain 12/08/2017  . Adjustment disorder with mixed anxiety and depressed mood 11/13/2017  . At risk for abuse of opiates 11/13/2017  . Acute kidney failure, unspecified (HCC) 11/02/2017  . Rhabdomyolysis 11/02/2017  . Acute respiratory failure (HCC) 11/02/2017  . Cardiogenic shock (HCC) 10/30/2017  . Cardiac arrest (HCC) 10/30/2017  . Drug abuse (HCC) 10/30/2017    Past Surgical History:  Procedure Laterality Date  . AMPUTATION Right 11/03/2017   Procedure: KNEE DISARTICULATION;  Surgeon: Renford Dills, MD;  Location: [facility] ORS;  Service: Vascular;  Laterality: Right;  . AMPUTATION Right 11/10/2017   Procedure: AMPUTATION ABOVE KNEE;  Surgeon: Renford Dills, MD;  Location: [facility] ORS;  Service: Vascular;  Laterality: Right;  . ankle/foot surgery with metal plates    . DIALYSIS/PERMA CATHETER INSERTION N/A 11/16/2017   Procedure: DIALYSIS/PERMA CATHETER INSERTION;  Surgeon: Annice Needy, MD;  Location: [facility] INVASIVE CV LAB;  Service: Cardiovascular;  Laterality: N/A;  . DIALYSIS/PERMA CATHETER REMOVAL N/A 12/14/2017   Procedure: DIALYSIS/PERMA CATHETER  REMOVAL;  Surgeon: Annice Needy, MD;  Location: [facility] INVASIVE CV LAB;  Service: Cardiovascular;  Laterality: N/A;  . permacath to right side     Scheduled to be removed    Prior to Admission medications   Medication Sig Start Date End Date Taking? Authorizing Provider  albuterol (VENTOLIN HFA) 108 (90 Base) MCG/ACT inhaler Inhale 2 puffs into the lungs every 6 (six) hours as needed for wheezing or shortness of breath. 10/25/20  Yes Ahmon Tosi, Roselyn Bering, PA-C  gabapentin (NEURONTIN) 300 MG capsule Take 1 capsule (300 mg total) by mouth 3 (three)  times daily. Patient not taking: Reported on 09/18/2020 12/06/18   Georgiana Spinner, NP  lamoTRIgine (LAMICTAL) 25 MG tablet Take 1 tablet (25 mg total) by mouth daily. 10/25/20   Faythe Ghee, PA-C  Misc. Devices MISC Right leg Prosthetic. Dx: Right AKA 05/05/19   Hoy Register, MD    Allergies Codeine  Family History  Problem Relation Age of Onset  . Depression Mother   . Bipolar disorder Mother   . Varicose Veins Neg Hx     Social History Social History   Tobacco Use  . Smoking status: Current Every Day Smoker    Packs/day: 0.50    Types: Cigarettes  . Smokeless tobacco: Never Used  Vaping Use  . Vaping Use: Never used  Substance Use Topics  . Alcohol use: No  . Drug use: Not Currently    Types: Cocaine, Methamphetamines    Review of Systems  Constitutional: No fever/chills Eyes: No visual changes. ENT: No sore throat. Respiratory: Positive cough and shortness of breath Cardiovascular: Denies chest pain Gastrointestinal: Denies abdominal pain Genitourinary: Negative for dysuria. Musculoskeletal: Negative for back pain. Skin: Negative for rash. Psychiatric: Positive depression    ____________________________________________   PHYSICAL EXAM:  VITAL SIGNS: ED Triage Vitals  Enc Vitals Group     BP 10/25/20 0529 (!) 162/103     Pulse Rate 10/25/20 0529 (!) 102     Resp 10/25/20 0529 20     Temp 10/25/20 0529 98.4 F (36.9 C)     Temp Source 10/25/20 0529 Oral     SpO2 10/25/20 0529 96 %     Weight 10/25/20 0530 164 lb (74.4 kg)     Height 10/25/20 0530 5\' 10"  (1.778 m)     Head Circumference --      Peak Flow --      Pain Score 10/25/20 0529 3     Pain Loc --      Pain Edu? --      Excl. in GC? --     Constitutional: Alert and oriented. Well appearing and in no acute distress. Eyes: Conjunctivae are normal.  Head: Atraumatic. Nose: No congestion/rhinnorhea. Mouth/Throat: Mucous membranes are moist.   Neck:  supple no lymphadenopathy  noted Cardiovascular: Tachycardic, regular rhythm. Heart sounds are normal Respiratory: Normal respiratory effort.  No retractions, lungs c t a  GU: deferred Musculoskeletal: FROM all extremities, warm and well perfused Neurologic:  Normal speech and language.  Skin:  Skin is warm, dry and intact. No rash noted. Psychiatric: Mood and affect are normal. Speech and behavior are normal.  ____________________________________________   LABS (all labs ordered are listed, but only abnormal results are displayed)  Labs Reviewed - No data to display ____________________________________________   ____________________________________________  RADIOLOGY  Chest x-ray   ____________________________________________   PROCEDURES  Procedure(s) performed: No  Procedures    ____________________________________________   INITIAL IMPRESSION / ASSESSMENT AND  PLAN / ED COURSE  Pertinent labs & imaging results that were available during my care of the patient were reviewed by me and considered in my medical decision making (see chart for details).   Patient is 30 year old male presents with cough and shortness of breath.  Patient had surgery February 4.  See HPI.  Physical exam shows patient to be tachycardic and blood pressure is elevated.  Remainder of exam is unremarkable  DDx: Acute bronchitis, Covid, PE  Chest x-ray is read as normal, unable to view the image due to downtime at the radiology department  I did order a CBC, metabolic panel and CTA for PE.  Patient is refusing all labs and further imaging.  Had a long discussion with him about the risk of a blood clot following a surgery.  Explained to him that a blood clot could possibly kill him.  The patient states he does not want to stay and does not want to have the test done.  He states he just wants a refill on his Lamictal and albuterol inhaler.  Once again cautioned him that a PE could kill him.  He states he does not care.  He  was discharged in stable condition and also refused TTS consult.   GOTTI ALWIN was evaluated in Emergency Department on 10/25/2020 for the symptoms described in the history of present illness. He was evaluated in the context of the global COVID-19 pandemic, which necessitated consideration that the patient might be at risk for infection with the SARS-CoV-2 virus that causes COVID-19. Institutional protocols and algorithms that pertain to the evaluation of patients at risk for COVID-19 are in a state of rapid change based on information released by regulatory bodies including the CDC and federal and state organizations. These policies and algorithms were followed during the patient's care in the ED.    As part of my medical decision making, I reviewed the following data within the electronic MEDICAL RECORD NUMBER Nursing notes reviewed and incorporated, Old chart reviewed, Notes from prior ED visits and [place] Controlled Substance Database  ____________________________________________   FINAL CLINICAL IMPRESSION(S) / ED DIAGNOSES  Final diagnoses:  Cough  Shortness of breath      NEW MEDICATIONS STARTED DURING THIS VISIT:  Discharge Medication List as of 10/25/2020  9:14 AM    START taking these medications   Details  albuterol (VENTOLIN HFA) 108 (90 Base) MCG/ACT inhaler Inhale 2 puffs into the lungs every 6 (six) hours as needed for wheezing or shortness of breath., Starting Thu 10/25/2020, Normal         Note:  This document was prepared using Dragon voice recognition software and may include unintentional dictation errors.    Faythe Ghee, PA-C 10/25/20 2025    Phineas Semen, MD 10/25/20 1125

## 2020-10-25 NOTE — ED Notes (Signed)
Pt refusing protocols at this time, states "I know it is bronchitis" I just need antibiotics.

## 2020-10-25 NOTE — ED Notes (Signed)
Pt refuses blood draw  States he was seen this week  Had several x-rays on shoulder and again this am  States he is concerned about too much radiation   Was placed on prednisone by the surgeon that did his neck surgery

## 2020-10-25 NOTE — ED Triage Notes (Signed)
Pt here with co cough, shob and chest pain. Hx of bronchitis and states feels the same. Pt still smokes, denies any fever.

## 2020-10-25 NOTE — ED Notes (Signed)
Encouraged pt to stay and have tests done  States he wanted to leave  Provider in with pt  Explained the risks

## 2020-10-25 NOTE — ED Notes (Signed)
See triage note  Presents with cough and chills  States hx of bronchitis in past and feels same  Having some discomfort to chest with cough

## 2020-11-28 ENCOUNTER — Ambulatory Visit: Payer: Medicare Other | Admitting: Urology

## 2020-11-28 ENCOUNTER — Encounter: Payer: Self-pay | Admitting: Urology

## 2020-12-09 ENCOUNTER — Emergency Department
Admission: EM | Admit: 2020-12-09 | Discharge: 2020-12-09 | Disposition: A | Discharge status: Home / Self Care | Payer: Medicare Other | Attending: Emergency Medicine | Admitting: Emergency Medicine

## 2020-12-09 ENCOUNTER — Emergency Department: Payer: Medicare Other

## 2020-12-09 DIAGNOSIS — J45909 Unspecified asthma, uncomplicated: Secondary | ICD-10-CM | POA: Diagnosis not present

## 2020-12-09 DIAGNOSIS — G8929 Other chronic pain: Secondary | ICD-10-CM | POA: Diagnosis not present

## 2020-12-09 DIAGNOSIS — M5442 Lumbago with sciatica, left side: Secondary | ICD-10-CM | POA: Diagnosis not present

## 2020-12-09 DIAGNOSIS — Y92410 Unspecified street and highway as the place of occurrence of the external cause: Secondary | ICD-10-CM | POA: Insufficient documentation

## 2020-12-09 DIAGNOSIS — F17210 Nicotine dependence, cigarettes, uncomplicated: Secondary | ICD-10-CM | POA: Diagnosis not present

## 2020-12-09 DIAGNOSIS — I10 Essential (primary) hypertension: Secondary | ICD-10-CM | POA: Diagnosis not present

## 2020-12-09 DIAGNOSIS — M5441 Lumbago with sciatica, right side: Secondary | ICD-10-CM | POA: Insufficient documentation

## 2020-12-09 DIAGNOSIS — M542 Cervicalgia: Secondary | ICD-10-CM | POA: Insufficient documentation

## 2020-12-09 MED ORDER — METHOCARBAMOL 500 MG PO TABS: 750.0000 mg | ORAL_TABLET | Freq: Once | ORAL | Status: DC

## 2020-12-09 MED ORDER — MELOXICAM 15 MG PO TABS: 15.0000 mg | ORAL_TABLET | Freq: Every day | ORAL | 0 refills | Status: AC

## 2020-12-09 MED ORDER — MELOXICAM 7.5 MG PO TABS: 15.0000 mg | ORAL_TABLET | Freq: Once | ORAL | Status: DC

## 2020-12-09 MED ORDER — METHOCARBAMOL 750 MG PO TABS: 750.0000 mg | ORAL_TABLET | Freq: Four times a day (QID) | ORAL | 0 refills | Status: AC | PRN

## 2020-12-09 MED ORDER — ACETAMINOPHEN 325 MG PO TABS
650.0000 mg | ORAL_TABLET | Freq: Once | ORAL | Status: AC
  Administered 2020-12-09: 650 mg via ORAL
  Filled 2020-12-09: qty 2
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
## 2020-12-09 NOTE — ED Notes (Signed)
Pt not in room when RN entered to review discharge instructions.  Attempted to call patient's cell phone number without answer.  Unable to obtain discharge vitals or signature d/t leaving prior.

## 2020-12-09 NOTE — Discharge Instructions (Addendum)
You have been prescribed Robaxin and Mobic.  Please take as directed.  You may also use this with Tylenol, up to 1000 units 4 times daily.  Please follow-up with your neurosurgeon since you are still in your postoperative period.  Return to the emergency department with any worsening.
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
## 2020-12-09 NOTE — ED Notes (Signed)
Patient taken to Xray at this time
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
## 2020-12-13 ENCOUNTER — Ambulatory Visit: Payer: Medicare Other | Admitting: Urology

## 2020-12-13 ENCOUNTER — Other Ambulatory Visit: Payer: Self-pay

## 2020-12-26 ENCOUNTER — Ambulatory Visit: Payer: Medicare Other | Admitting: Urology

## 2020-12-26 NOTE — Progress Notes (Deleted)
12/26/2020 7:17 AM   Joshua [name] 1991/02/07 [id number]  Referring provider: No referring provider defined for this encounter.  No chief complaint on file.   HPI: Joshua [name] is a 30 y.o. male referred for voiding difficulty.   PMH: Past Medical History:  Diagnosis Date  . Acute renal failure (ARF) (HCC)    History of   . Aspiration pneumonia (HCC)    Hx of  . Asthma   . Bronchitis   . Cardiac arrest (HCC)    hx of  . Cardiogenic shock (HCC)    hx of  . Drug overdose   . Hernia cerebri (HCC)   . History of acute respiratory failure   . Hypertension   . Neuropathy   . Polysubstance abuse (HCC)   . Rhabdomyolysis   . Seizures [facility])     Surgical History: Past Surgical History:  Procedure Laterality Date  . AMPUTATION Right 11/03/2017   Procedure: KNEE DISARTICULATION;  Surgeon: Renford Dills, MD;  Location: [facility] ORS;  Service: Vascular;  Laterality: Right;  . AMPUTATION Right 11/10/2017   Procedure: AMPUTATION ABOVE KNEE;  Surgeon: Renford Dills, MD;  Location: [facility] ORS;  Service: Vascular;  Laterality: Right;  . ankle/foot surgery with metal plates    . CERVICAL SPINE SURGERY    . DIALYSIS/PERMA CATHETER INSERTION N/A 11/16/2017   Procedure: DIALYSIS/PERMA CATHETER INSERTION;  Surgeon: Annice Needy, MD;  Location: [facility] INVASIVE CV LAB;  Service: Cardiovascular;  Laterality: N/A;  . DIALYSIS/PERMA CATHETER REMOVAL N/A 12/14/2017   Procedure: DIALYSIS/PERMA CATHETER REMOVAL;  Surgeon: Annice Needy, MD;  Location: [facility] INVASIVE CV LAB;  Service: Cardiovascular;  Laterality: N/A;  . permacath to right side     Scheduled to be removed    Home Medications:  Allergies as of 12/26/2020      Reactions   Codeine Other (See Comments)   seizures      Medication List       Accurate as of Dec 26, 2020  7:17 AM. If you have any questions, ask your nurse or doctor.        albuterol 108 (90 Base) MCG/ACT inhaler Commonly known as: VENTOLIN HFA Inhale 2 puffs into  the lungs every 6 (six) hours as needed for wheezing or shortness of breath.   gabapentin 300 MG capsule Commonly known as: NEURONTIN Take 1 capsule (300 mg total) by mouth 3 (three) times daily.   lamoTRIgine 25 MG tablet Commonly known as: LAMICTAL Take 1 tablet (25 mg total) by mouth daily.   Misc. Devices Misc Right leg Prosthetic. Dx: Right AKA       Allergies:  Allergies  Allergen Reactions  . Codeine Other (See Comments)    seizures    Family History: Family History  Problem Relation Age of Onset  . Depression Mother   . Bipolar disorder Mother   . Varicose Veins Neg Hx     Social History:  reports that he has been smoking cigarettes. He has been smoking about 0.50 packs per day. He has never used smokeless tobacco. He reports previous drug use. Drugs: Cocaine and Methamphetamines. He reports that he does not drink alcohol.   Physical Exam: There were no vitals taken for this visit.  Constitutional:  Alert and oriented, No acute distress. HEENT: [place] AT, moist mucus membranes.  Trachea midline, no masses. Cardiovascular: No clubbing, cyanosis, or edema. Respiratory: Normal respiratory effort, no increased work of breathing. GI: Abdomen is soft, nontender, nondistended, no abdominal masses  GU: No CVA tenderness Lymph: No cervical or inguinal lymphadenopathy. Skin: No rashes, bruises or suspicious lesions. Neurologic: Grossly intact, no focal deficits, moving all 4 extremities. Psychiatric: Normal mood and affect.  Laboratory Data: Lab Results  Component Value Date   WBC 14.5 (H) 10/19/2018   HGB 15.8 10/19/2018   HCT 48.0 10/19/2018   MCV 92.8 10/19/2018   PLT 291 10/19/2018    Lab Results  Component Value Date   CREATININE 1.04 10/19/2018    No results found for: PSA  No results found for: TESTOSTERONE  No results found for: HGBA1C  Urinalysis    Component Value Date/Time   COLORURINE YELLOW 10/19/2018 1740   APPEARANCEUR CLEAR 10/19/2018  1740   APPEARANCEUR Turbid 05/08/2014 2307   LABSPEC 1.014 10/19/2018 1740   LABSPEC 1.012 05/08/2014 2307   PHURINE 6.0 10/19/2018 1740   GLUCOSEU 150 (A) 10/19/2018 1740   GLUCOSEU Negative 05/08/2014 2307   HGBUR NEGATIVE 10/19/2018 1740   BILIRUBINUR NEGATIVE 10/19/2018 1740   BILIRUBINUR Negative 05/08/2014 2307   KETONESUR NEGATIVE 10/19/2018 1740   PROTEINUR NEGATIVE 10/19/2018 1740   NITRITE NEGATIVE 10/19/2018 1740   LEUKOCYTESUR NEGATIVE 10/19/2018 1740   LEUKOCYTESUR Negative 05/08/2014 2307    Lab Results  Component Value Date   BACTERIA RARE (A) 08/02/2018    Pertinent Imaging: *** No results found for this or any previous visit.  No results found for this or any previous visit.  No results found for this or any previous visit.  No results found for this or any previous visit.  No results found for this or any previous visit.  No results found for this or any previous visit.  No results found for this or any previous visit.  No results found for this or any previous visit.   Assessment & Plan:    There are no diagnoses linked to this encounter.  No follow-ups on file.  Riki Altes, MD  [facility] [address] [place], [place] [address] [phone]

## 2020-12-28 ENCOUNTER — Encounter: Payer: Self-pay | Admitting: Urology

## 2021-01-16 ENCOUNTER — Ambulatory Visit: Payer: Medicare Other

## 2021-01-17 ENCOUNTER — Ambulatory Visit: Payer: Medicare Other

## 2021-01-22 ENCOUNTER — Ambulatory Visit: Payer: Medicare Other

## 2021-01-23 ENCOUNTER — Ambulatory Visit: Payer: Medicare Other

## 2021-01-23 ENCOUNTER — Ambulatory Visit: Payer: Medicare Other | Admitting: Urology

## 2021-01-24 ENCOUNTER — Ambulatory Visit: Payer: Medicare Other | Attending: Neurosurgery

## 2021-01-24 ENCOUNTER — Ambulatory Visit: Payer: Medicare Other

## 2021-01-24 ENCOUNTER — Other Ambulatory Visit: Payer: Self-pay

## 2021-01-24 DIAGNOSIS — M6281 Muscle weakness (generalized): Secondary | ICD-10-CM | POA: Diagnosis not present

## 2021-01-24 DIAGNOSIS — M542 Cervicalgia: Secondary | ICD-10-CM | POA: Diagnosis present
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
## 2021-01-28 ENCOUNTER — Ambulatory Visit: Payer: Medicare Other

## 2021-01-30 ENCOUNTER — Ambulatory Visit: Payer: Medicare Other

## 2021-01-30 ENCOUNTER — Other Ambulatory Visit: Payer: Self-pay

## 2021-01-30 ENCOUNTER — Ambulatory Visit: Payer: Medicare Other | Admitting: Urology

## 2021-01-30 DIAGNOSIS — M6281 Muscle weakness (generalized): Secondary | ICD-10-CM

## 2021-01-30 DIAGNOSIS — M542 Cervicalgia: Secondary | ICD-10-CM
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
## 2021-01-31 ENCOUNTER — Ambulatory Visit: Payer: Medicare Other

## 2021-02-04 ENCOUNTER — Ambulatory Visit: Payer: Medicare Other

## 2021-02-06 ENCOUNTER — Encounter: Payer: Self-pay | Admitting: Urology

## 2021-02-06 ENCOUNTER — Ambulatory Visit (INDEPENDENT_AMBULATORY_CARE_PROVIDER_SITE_OTHER): Payer: Medicare Other | Admitting: Urology

## 2021-02-06 ENCOUNTER — Ambulatory Visit: Payer: Medicare Other | Admitting: Physical Therapy

## 2021-02-06 ENCOUNTER — Other Ambulatory Visit: Payer: Self-pay

## 2021-02-06 ENCOUNTER — Encounter: Payer: Self-pay | Admitting: Physical Therapy

## 2021-02-06 VITALS — BP 129/84 | HR 93 | Ht 70.0 in | Wt 173.8 lb

## 2021-02-06 DIAGNOSIS — A630 Anogenital (venereal) warts: Secondary | ICD-10-CM | POA: Diagnosis not present

## 2021-02-06 DIAGNOSIS — M542 Cervicalgia: Secondary | ICD-10-CM

## 2021-02-06 DIAGNOSIS — R350 Frequency of micturition: Secondary | ICD-10-CM

## 2021-02-06 DIAGNOSIS — M6281 Muscle weakness (generalized): Secondary | ICD-10-CM | POA: Diagnosis not present

## 2021-02-06 LAB — BLADDER SCAN AMB NON-IMAGING: Scan Result: 0
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
## 2021-02-06 NOTE — Progress Notes (Signed)
02/06/2021 10:28 AM   Joshua [name] 02/09/1991 [id number]  Referring provider: No referring provider defined for this encounter.  Chief Complaint  Patient presents with   Urinary Frequency    HPI: Joshua [name] is a 30 y.o. 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  Penile lesion since age 51 which have slowly increased in size over the years Does have intermittent irritation of a penile shaft lesion Denies dysuria, gross hematuria No prior history of urologic problems or prior urologic evaluation   PMH: Past Medical History:  Diagnosis Date   Acute renal failure (ARF) (HCC)    History of    Aspiration pneumonia (HCC)    Hx of   Asthma    Bronchitis    Cardiac arrest (HCC)    hx of   Cardiogenic shock (HCC)    hx of   Drug overdose    Hernia cerebri (HCC)    History of acute respiratory failure    Hypertension    Neuropathy    Polysubstance abuse (HCC)    Rhabdomyolysis    Seizures (HCC)     Surgical History: Past Surgical History:  Procedure Laterality Date   AMPUTATION Right 11/03/2017   Procedure: KNEE DISARTICULATION;  Surgeon: Renford Dills, MD;  Location: [facility];  Service: Vascular;  Laterality: Right;   AMPUTATION Right 11/10/2017   Procedure: AMPUTATION ABOVE KNEE;  Surgeon: Renford Dills, MD;  Location: [facility];  Service: Vascular;  Laterality: Right;   ankle/foot surgery with metal plates     CERVICAL SPINE SURGERY     DIALYSIS/PERMA CATHETER INSERTION N/A 11/16/2017   Procedure: DIALYSIS/PERMA CATHETER INSERTION;  Surgeon: Annice Needy, MD;  Location: [facility] IN[facility];  Service: Cardiovascular;  Laterality: N/A;   DIALYSIS/PERMA CATHETER REMOVAL N/A 12/14/2017   Procedure: DIALYSIS/PERMA CATHETER REMOVAL;  Surgeon: Annice Needy, MD;  Location: [facility];  Service: Cardiovascular;  Laterality: N/A;    permacath to right side     Scheduled to be removed    Home Medications:  Allergies as of 02/06/2021       Reactions   Codeine Other (See Comments)   seizures   Zoloft [sertraline]         Medication List        Accurate as of February 06, 2021 10:28 AM. If you have any questions, ask your nurse or doctor.          albuterol 108 (90 Base) MCG/ACT inhaler Commonly known as: VENTOLIN HFA Inhale 2 puffs into the lungs every 6 (six) hours as needed for wheezing or shortness of breath.   gabapentin 300 MG capsule Commonly known as: NEURONTIN Take 1 capsule (300 mg total) by mouth 3 (three) times daily.   lamoTRIgine 25 MG tablet Commonly known as: LAMICTAL Take by mouth. What changed: Another medication with the same name was removed. Continue taking this medication, and follow the directions you see here. Changed by: Riki Altes, MD   Misc. Devices Misc Right leg Prosthetic. Dx: Right AKA        Allergies:  Allergies  Allergen Reactions   Codeine Other (See Comments)    seizures   Zoloft [Sertraline]     Family History: Family History  Problem Relation Age of Onset   Depression Mother    Bipolar disorder Mother  Varicose Veins Neg Hx     Social History:  reports that he has been smoking cigarettes. He has been smoking an average of 0.50 packs per day. He has never used smokeless tobacco. He reports previous drug use. Drugs: Cocaine and Methamphetamines. He reports that he does not drink alcohol.   Physical Exam: BP 129/84   Pulse 93   Ht 5\' 10"  (1.778 m)   Wt 173 lb 12.8 oz (78.8 kg)   BMI 24.94 kg/m   Constitutional:  Alert and oriented, No acute distress. HEENT: [place] AT, moist mucus membranes.  Trachea midline, no masses. Cardiovascular: No clubbing, cyanosis, or edema. Respiratory: Normal respiratory effort, no increased work of breathing. GI: Abdomen is soft, nontender, nondistended, no abdominal masses GU: Scattered flat verrucoid  lesions penopubic area and scrotum with the largest measuring 5 mm.  Lesion left midshaft penis measuring approximately 3 mm Skin: No rashes, bruises or suspicious lesions. Neurologic: Grossly intact, no focal deficits, moving all 4 extremities. Psychiatric: Normal mood and affect.   Assessment & Plan:    1.  Condylomata acuminata external genitalia Management options were discussed including liquid nitrogen therapy by dermatology, excision and CO2 laser vaporization Will plan on CO2 laser vaporization with excision of at least 1 lesion for tissue diagnosis The procedure was discussed in detail occluding potential risks of bleeding and infection All questions were answered and he desires to proceed He will be contacted by for scheduling    [name], MD  [facility] [address] [place], [place] [phone]

## 2021-02-07 LAB — URINALYSIS, COMPLETE
Bilirubin, UA: NEGATIVE
Glucose, UA: NEGATIVE
Ketones, UA: NEGATIVE
Leukocytes,UA: NEGATIVE
Nitrite, UA: NEGATIVE
Protein,UA: NEGATIVE
RBC, UA: NEGATIVE
Specific Gravity, UA: <1.005 — ABNORMAL LOW (ref 1.005–1.030)
Urobilinogen, Ur: 0.2 mg/dL (ref 0.2–1.0)
pH, UA: 6.5 (ref 5.0–7.5)

## 2021-02-07 LAB — MICROSCOPIC EXAMINATION: RBC, Urine: NONE SEEN /hpf (ref 0–2)

## 2021-02-13 ENCOUNTER — Telehealth: Payer: Self-pay | Admitting: Urology

## 2021-02-13 NOTE — Telephone Encounter (Signed)
LMOM for pt. To return my call to discuss surgery date 

## 2021-02-14 ENCOUNTER — Other Ambulatory Visit: Payer: Self-pay | Admitting: Urology

## 2021-02-14 DIAGNOSIS — A630 Anogenital (venereal) warts: Secondary | ICD-10-CM

## 2021-02-15 ENCOUNTER — Other Ambulatory Visit: Payer: Self-pay

## 2021-02-15 ENCOUNTER — Other Ambulatory Visit
Admission: RE | Admit: 2021-02-15 | Discharge: 2021-02-15 | Disposition: A | Discharge status: Home / Self Care | Payer: Medicare Other | Source: Ambulatory Visit | Attending: Urology | Admitting: Urology

## 2021-02-15 NOTE — Patient Instructions (Addendum)
Your procedure is scheduled on: February 19, 2021 TUESDAY Report to the Registration Desk on the 1st floor of the [facility] To find out your arrival time, please call [phone] between 1PM - 3PM on: Monday February 18, 2021  REMEMBER: Instructions that are not followed completely may result in serious medical risk, up to and including death; or upon the discretion of your surgeon and anesthesiologist your surgery may need to be rescheduled.  Do not eat food after midnight the night before surgery.  No gum chewing, lozengers or hard candies.  TAKE THESE MEDICATIONS THE MORNING OF SURGERY WITH A SIP OF WATER: Gabapentin Lamictal Alprazolam if needed  Use inhalers on the day of surgery   One week prior to surgery: Stop Anti-inflammatories (NSAIDS) such as Advil, Aleve, Ibuprofen, Motrin, Naproxen, Naprosyn and  ASPIRIN OR Aspirin based products such as Excedrin, Goodys Powder, BC Powder. Stop ANY OVER THE COUNTER supplements until after surgery. You may however, continue to take Tylenol if needed for pain up until the day of surgery.  No Alcohol for 24 hours before or after surgery.  No Smoking including e-cigarettes for 24 hours prior to surgery.  No chewable tobacco products for at least 6 hours prior to surgery.  No nicotine patches on the day of surgery.  Do not use any "recreational" drugs for at least a week prior to your surgery.  Please be advised that the combination of cocaine and anesthesia may have negative outcomes, up to and including death. If you test positive for cocaine, your surgery will be cancelled.  On the morning of surgery brush your teeth with toothpaste and water, you may rinse your mouth with mouthwash if you wish. Do not swallow any toothpaste or mouthwash.  Do not wear jewelry, make-up, hairpins, clips or nail polish.  Do not wear lotions, powders, or perfumes or deodorant   Do not shave body from the neck down 48 hours prior to surgery just in case  you cut yourself which could leave a site for infection.  Also, freshly shaved skin may become irritated if using the CHG soap.  Contact lenses, hearing aids and dentures may not be worn into surgery.  Do not bring valuables to the hospital. [facility] is not responsible for any missing/lost belongings or valuables.   SHOWER MORNING OF SURGERY.  Notify your doctor if there is any change in your medical condition (cold, fever, infection).  Wear comfortable clothing (specific to your surgery type) to the hospital.  After surgery, you can help prevent lung complications by doing breathing exercises.  Take deep breaths and cough every 1-2 hours. Your doctor may order a device called an Incentive Spirometer to help you take deep breaths. When coughing or sneezing, hold a pillow firmly against your incision with both hands. This is called "splinting." Doing this helps protect your incision. It also decreases belly discomfort.   If you are being discharged the day of surgery, you will not be allowed to drive home. You will need a responsible adult (18 years or older) to drive you home and stay with you that night.   If you are taking public transportation, you will need to have a responsible adult (18 years or older) with you. Please confirm with your physician that it is acceptable to use public transportation.   Please call the Pre-admissions Testing Dept. at [phone] if you have any questions about these instructions.  Surgery Visitation Policy:  Patients undergoing a surgery or procedure may  have one family member or support person with them as long as that person is not COVID-19 positive or experiencing its symptoms.  That person may remain in the waiting area during the procedure. EVERYONE MUST WEAR A MASK REGARDLESS OF VACCINATION STATUS.

## 2021-02-18 ENCOUNTER — Other Ambulatory Visit: Payer: No Typology Code available for payment source

## 2021-02-19 ENCOUNTER — Ambulatory Visit: Payer: Medicare Other | Admitting: Physical Therapy

## 2021-02-19 ENCOUNTER — Other Ambulatory Visit: Payer: Self-pay

## 2021-02-19 ENCOUNTER — Ambulatory Visit
Admission: RE | Admit: 2021-02-19 | Discharge: 2021-02-19 | Disposition: A | Discharge status: Home / Self Care | Payer: Medicare Other | Attending: Urology | Admitting: Urology

## 2021-02-19 ENCOUNTER — Encounter: Payer: Self-pay | Admitting: Urology

## 2021-02-19 ENCOUNTER — Encounter: Payer: Self-pay | Admitting: Anesthesiology

## 2021-02-19 ENCOUNTER — Encounter
Admission: RE | Disposition: A | Discharge status: Home / Self Care | Payer: Self-pay | Source: Home / Self Care | Attending: Urology

## 2021-02-19 DIAGNOSIS — R825 Elevated urine levels of drugs, medicaments and biological substances: Secondary | ICD-10-CM | POA: Insufficient documentation

## 2021-02-19 DIAGNOSIS — A630 Anogenital (venereal) warts: Secondary | ICD-10-CM | POA: Diagnosis present

## 2021-02-19 DIAGNOSIS — Z538 Procedure and treatment not carried out for other reasons: Secondary | ICD-10-CM | POA: Insufficient documentation

## 2021-02-19 LAB — URINE DRUG SCREEN, QUALITATIVE (ARMC ONLY)
Amphetamines, Ur Screen: NOT DETECTED
Barbiturates, Ur Screen: NOT DETECTED
Benzodiazepine, Ur Scrn: NOT DETECTED
Cannabinoid 50 Ng, Ur ~~LOC~~: NOT DETECTED
Cocaine Metabolite,Ur ~~LOC~~: POSITIVE — AB
MDMA (Ecstasy)Ur Screen: NOT DETECTED
Methadone Scn, Ur: NOT DETECTED
Opiate, Ur Screen: NOT DETECTED
Phencyclidine (PCP) Ur S: NOT DETECTED
Tricyclic, Ur Screen: NOT DETECTED

## 2021-02-19 SURGERY — REMOVAL, CONDYLOMA
Anesthesia: Choice

## 2021-02-19 MED ORDER — CHLORHEXIDINE GLUCONATE 0.12 % MT SOLN
OROMUCOSAL | Status: AC
  Filled 2021-02-19: qty 15

## 2021-02-19 MED ORDER — ORAL CARE MOUTH RINSE: 15.0000 mL | Freq: Once | OROMUCOSAL | Status: DC

## 2021-02-19 MED ORDER — CEFAZOLIN SODIUM-DEXTROSE 2-4 GM/100ML-% IV SOLN: 2.0000 g | INTRAVENOUS | Status: DC

## 2021-02-19 MED ORDER — FAMOTIDINE 20 MG PO TABS: 20.0000 mg | ORAL_TABLET | Freq: Once | ORAL | Status: DC

## 2021-02-19 MED ORDER — CHLORHEXIDINE GLUCONATE 0.12 % MT SOLN: 15.0000 mL | Freq: Once | OROMUCOSAL | Status: DC

## 2021-02-19 MED ORDER — CEFAZOLIN SODIUM-DEXTROSE 2-4 GM/100ML-% IV SOLN
INTRAVENOUS | Status: AC
  Filled 2021-02-19: qty 100

## 2021-02-19 MED ORDER — LACTATED RINGERS IV SOLN: INTRAVENOUS | Status: DC

## 2021-02-19 SURGICAL SUPPLY — 18 items
BASIN GRAD PLASTIC 32OZ STRL (MISCELLANEOUS) ×3 IMPLANT
CANISTER SUCT 1200ML W/VALVE (MISCELLANEOUS) ×3 IMPLANT
COVER BACK TABLE REUSABLE LG (DRAPES) ×3 IMPLANT
COVER WAND RF STERILE (DRAPES) ×3 IMPLANT
DRAPE LAPAROTOMY 100X77 ABD (DRAPES) ×3 IMPLANT
GAUZE PETROLATUM 1 X8 (GAUZE/BANDAGES/DRESSINGS) ×3 IMPLANT
GAUZE SPONGE 4X4 12PLY STRL (GAUZE/BANDAGES/DRESSINGS) ×3 IMPLANT
GLOVE SURG UNDER POLY LF SZ7.5 (GLOVE) ×3 IMPLANT
GOWN STRL REUS W/ TWL LRG LVL3 (GOWN DISPOSABLE) ×2 IMPLANT
GOWN STRL REUS W/ TWL XL LVL3 (GOWN DISPOSABLE) ×1 IMPLANT
GOWN STRL REUS W/TWL LRG LVL3 (GOWN DISPOSABLE) ×4
GOWN STRL REUS W/TWL XL LVL3 (GOWN DISPOSABLE) ×2
GRADUATE 1200CC STRL 31836 (MISCELLANEOUS) ×3 IMPLANT
KIT TURNOVER KIT A (KITS) ×3 IMPLANT
MANIFOLD NEPTUNE II (INSTRUMENTS) ×3 IMPLANT
TUBING CONNECTING 10 (TUBING) ×2 IMPLANT
TUBING CONNECTING 10' (TUBING) ×1
WATER STERILE IRR 1000ML POUR (IV SOLUTION) ×3 IMPLANT

## 2021-02-19 NOTE — Progress Notes (Signed)
Patient's UDS came back positive for cocaine.  I notified Dr. Randa Ngo, he spoke with Dr. Lonna Cobb and the patient. It was decided the surgery would be canceled today and rescheduled for a later date. Patient get dressed and walked out.

## 2021-02-20 ENCOUNTER — Ambulatory Visit: Payer: Medicare Other | Admitting: Physical Therapy

## 2021-02-27 ENCOUNTER — Ambulatory Visit: Payer: Medicare Other | Attending: Neurosurgery | Admitting: Physical Therapy

## 2021-02-27 DIAGNOSIS — M6281 Muscle weakness (generalized): Secondary | ICD-10-CM | POA: Insufficient documentation

## 2021-02-27 DIAGNOSIS — M542 Cervicalgia: Secondary | ICD-10-CM | POA: Insufficient documentation

## 2021-02-28 MED ORDER — CHLORHEXIDINE GLUCONATE CLOTH 2 % EX PADS: 6.0000 | MEDICATED_PAD | Freq: Once | CUTANEOUS | Status: DC

## 2021-02-28 MED ORDER — CEFAZOLIN SODIUM-DEXTROSE 2-4 GM/100ML-% IV SOLN: 2.0000 g | INTRAVENOUS | Status: DC

## 2021-03-01 ENCOUNTER — Telehealth: Payer: Self-pay | Admitting: Physical Therapy

## 2021-03-01 ENCOUNTER — Ambulatory Visit: Payer: Medicare Other | Admitting: Physical Therapy

## 2021-03-01 NOTE — Telephone Encounter (Signed)
Pt reports he had to cancel appointment today, because of car trouble. He states he has been experiencing increased memory issues and that his PCP has referred him to a neurologist for futher evaluation. He has been able to do the exercises, but has not noticed much difference in his neck pain. Pt reports he plans on being to clinic on Tuesday for his appointment.

## 2021-03-05 ENCOUNTER — Ambulatory Visit: Payer: Medicare Other | Admitting: Physical Therapy

## 2021-03-05 DIAGNOSIS — M542 Cervicalgia: Secondary | ICD-10-CM

## 2021-03-05 DIAGNOSIS — M6281 Muscle weakness (generalized): Secondary | ICD-10-CM
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
## 2021-03-07 ENCOUNTER — Encounter: Payer: Self-pay | Admitting: Physical Therapy

## 2021-03-07 ENCOUNTER — Other Ambulatory Visit: Payer: Self-pay | Admitting: Physician Assistant

## 2021-03-07 ENCOUNTER — Ambulatory Visit: Payer: Medicare Other | Admitting: Physical Therapy

## 2021-03-07 ENCOUNTER — Other Ambulatory Visit (HOSPITAL_COMMUNITY): Payer: Self-pay | Admitting: Physician Assistant

## 2021-03-07 DIAGNOSIS — M542 Cervicalgia: Secondary | ICD-10-CM

## 2021-03-07 DIAGNOSIS — R413 Other amnesia: Secondary | ICD-10-CM

## 2021-03-07 DIAGNOSIS — M5440 Lumbago with sciatica, unspecified side: Secondary | ICD-10-CM

## 2021-03-07 DIAGNOSIS — R569 Unspecified convulsions: Secondary | ICD-10-CM | POA: Insufficient documentation

## 2021-03-07 DIAGNOSIS — M6281 Muscle weakness (generalized): Secondary | ICD-10-CM
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
## 2021-03-12 ENCOUNTER — Ambulatory Visit: Payer: Medicare Other | Admitting: Physical Therapy

## 2021-03-13 ENCOUNTER — Ambulatory Visit: Payer: Medicare Other | Admitting: Physical Therapy

## 2021-03-13 ENCOUNTER — Other Ambulatory Visit: Payer: Self-pay

## 2021-03-13 DIAGNOSIS — M6281 Muscle weakness (generalized): Secondary | ICD-10-CM

## 2021-03-13 DIAGNOSIS — M542 Cervicalgia: Secondary | ICD-10-CM | POA: Diagnosis not present
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
## 2021-03-14 ENCOUNTER — Ambulatory Visit: Payer: Medicare Other | Admitting: Physician Assistant

## 2021-03-14 ENCOUNTER — Ambulatory Visit: Payer: Medicare Other | Admitting: Physical Therapy

## 2021-03-14 ENCOUNTER — Ambulatory Visit
Admission: RE | Admit: 2021-03-14 | Discharge: 2021-03-14 | Disposition: A | Discharge status: Home / Self Care | Payer: Medicare Other | Source: Ambulatory Visit | Attending: Physician Assistant | Admitting: Physician Assistant

## 2021-03-14 DIAGNOSIS — M542 Cervicalgia: Secondary | ICD-10-CM | POA: Diagnosis present

## 2021-03-14 DIAGNOSIS — M5440 Lumbago with sciatica, unspecified side: Secondary | ICD-10-CM | POA: Insufficient documentation

## 2021-03-14 DIAGNOSIS — R413 Other amnesia: Secondary | ICD-10-CM

## 2021-03-14 DIAGNOSIS — M6281 Muscle weakness (generalized): Secondary | ICD-10-CM
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
## 2021-03-15 NOTE — Patient Instructions (Signed)
Your procedure is scheduled on: 03/19/21 Report to the Registration Desk on the 1st floor of the [facility]. To find out your arrival time, please call [phone] between 1PM - 3PM on: 03/18/21  REMEMBER: Instructions that are not followed completely may result in serious medical risk, up to and including death; or upon the discretion of your surgeon and anesthesiologist your surgery may need to be rescheduled.  Do not eat food after midnight the night before surgery.  No gum chewing, lozengers or hard candies.  TAKE THESE MEDICATIONS THE MORNING OF SURGERY WITH A SIP OF WATER: Gabapentin Lamictal Alprazolam if needed  Use inhalers on the day of surgery   One week prior to surgery: Stop Anti-inflammatories (NSAIDS) such as Advil, Aleve, Ibuprofen, Motrin, Naproxen, Naprosyn and  ASPIRIN OR Aspirin based products such as Excedrin, Goodys Powder, BC Powder. Stop ANY OVER THE COUNTER supplements until after surgery. You may however, continue to take Tylenol if needed for pain up until the day of surgery.  No Alcohol for 24 hours before or after surgery.  No Smoking including e-cigarettes for 24 hours prior to surgery.  No chewable tobacco products for at least 6 hours prior to surgery.  No nicotine patches on the day of surgery.  Do not use any "recreational" drugs for at least a week prior to your surgery.  Please be advised that the combination of cocaine and anesthesia may have negative outcomes, up to and including death. If you test positive for cocaine, your surgery will be cancelled.  On the morning of surgery brush your teeth with toothpaste and water, you may rinse your mouth with mouthwash if you wish. Do not swallow any toothpaste or mouthwash.  Do not wear jewelry, make-up, hairpins, clips or nail polish.  Do not wear lotions, powders, or perfumes or deodorant   Do not shave body from the neck down 48 hours prior to surgery just in case you cut yourself which could  leave a site for infection.  Also, freshly shaved skin may become irritated if using the CHG soap.  Contact lenses, hearing aids and dentures may not be worn into surgery.  Do not bring valuables to the hospital. [facility] is not responsible for any missing/lost belongings or valuables.   SHOWER MORNING OF SURGERY.  Notify your doctor if there is any change in your medical condition (cold, fever, infection).  Wear comfortable clothing (specific to your surgery type) to the hospital.  After surgery, you can help prevent lung complications by doing breathing exercises.  Take deep breaths and cough every 1-2 hours. Your doctor may order a device called an Incentive Spirometer to help you take deep breaths. When coughing or sneezing, hold a pillow firmly against your incision with both hands. This is called "splinting." Doing this helps protect your incision. It also decreases belly discomfort.   If you are being discharged the day of surgery, you will not be allowed to drive home. You will need a responsible adult (18 years or older) to drive you home and stay with you that night.   If you are taking public transportation, you will need to have a responsible adult (18 years or older) with you. Please confirm with your physician that it is acceptable to use public transportation.   Please call the Pre-admissions Testing Dept. at [phone] if you have any questions about these instructions.  Surgery Visitation Policy:  Patients undergoing a surgery or procedure may have one family member or support  person with them as long as that person is not COVID-19 positive or experiencing its symptoms.  That person may remain in the waiting area during the procedure. EVERYONE MUST WEAR A MASK REGARDLESS OF VACCINATION STATUS.

## 2021-03-18 ENCOUNTER — Other Ambulatory Visit
Admission: RE | Admit: 2021-03-18 | Discharge: 2021-03-18 | Disposition: A | Discharge status: Home / Self Care | Payer: Medicare Other | Source: Ambulatory Visit | Attending: Urology | Admitting: Urology

## 2021-03-19 ENCOUNTER — Encounter
Admission: RE | Disposition: A | Discharge status: Home / Self Care | Payer: Self-pay | Source: Home / Self Care | Attending: Urology

## 2021-03-19 ENCOUNTER — Other Ambulatory Visit: Payer: Self-pay

## 2021-03-19 ENCOUNTER — Ambulatory Visit: Payer: Medicare Other | Admitting: Anesthesiology

## 2021-03-19 ENCOUNTER — Encounter: Payer: Self-pay | Admitting: Urology

## 2021-03-19 ENCOUNTER — Ambulatory Visit: Payer: Medicare Other | Admitting: Physical Therapy

## 2021-03-19 ENCOUNTER — Ambulatory Visit
Admission: RE | Admit: 2021-03-19 | Discharge: 2021-03-19 | Disposition: A | Discharge status: Home / Self Care | Payer: Medicare Other | Attending: Urology | Admitting: Urology

## 2021-03-19 DIAGNOSIS — Z79899 Other long term (current) drug therapy: Secondary | ICD-10-CM | POA: Diagnosis not present

## 2021-03-19 DIAGNOSIS — F17210 Nicotine dependence, cigarettes, uncomplicated: Secondary | ICD-10-CM | POA: Insufficient documentation

## 2021-03-19 DIAGNOSIS — Z885 Allergy status to narcotic agent status: Secondary | ICD-10-CM | POA: Diagnosis not present

## 2021-03-19 DIAGNOSIS — Z888 Allergy status to other drugs, medicaments and biological substances status: Secondary | ICD-10-CM | POA: Diagnosis not present

## 2021-03-19 DIAGNOSIS — A630 Anogenital (venereal) warts: Secondary | ICD-10-CM

## 2021-03-19 DIAGNOSIS — Z89611 Acquired absence of right leg above knee: Secondary | ICD-10-CM | POA: Diagnosis not present

## 2021-03-19 HISTORY — PX: CONDYLOMA EXCISION/FULGURATION: SHX1389

## 2021-03-19 LAB — URINE DRUG SCREEN, QUALITATIVE (ARMC ONLY)
Amphetamines, Ur Screen: NOT DETECTED
Barbiturates, Ur Screen: NOT DETECTED
Benzodiazepine, Ur Scrn: NOT DETECTED
Cannabinoid 50 Ng, Ur ~~LOC~~: NOT DETECTED
Cocaine Metabolite,Ur ~~LOC~~: NOT DETECTED
MDMA (Ecstasy)Ur Screen: NOT DETECTED
Methadone Scn, Ur: NOT DETECTED
Opiate, Ur Screen: NOT DETECTED
Phencyclidine (PCP) Ur S: NOT DETECTED
Tricyclic, Ur Screen: NOT DETECTED

## 2021-03-19 SURGERY — REMOVAL, CONDYLOMA
Anesthesia: General | Site: Perineum

## 2021-03-19 MED ORDER — FAMOTIDINE 20 MG PO TABS: 20.0000 mg | ORAL_TABLET | Freq: Once | ORAL | Status: AC

## 2021-03-19 MED ORDER — FAMOTIDINE 20 MG PO TABS
ORAL_TABLET | ORAL | Status: AC
  Administered 2021-03-19: 20 mg via ORAL
  Filled 2021-03-19: qty 1

## 2021-03-19 MED ORDER — PROPOFOL 10 MG/ML IV BOLUS
INTRAVENOUS | Status: AC
  Filled 2021-03-19: qty 20

## 2021-03-19 MED ORDER — ORAL CARE MOUTH RINSE
15.0000 mL | Freq: Once | OROMUCOSAL | Status: AC
Start: 2021-03-19 — End: 2021-03-19

## 2021-03-19 MED ORDER — PROPOFOL 10 MG/ML IV BOLUS
INTRAVENOUS | Status: AC
  Filled 2021-03-19: qty 40

## 2021-03-19 MED ORDER — ACETAMINOPHEN 10 MG/ML IV SOLN
INTRAVENOUS | Status: DC | PRN
  Administered 2021-03-19: 1000 mg via INTRAVENOUS

## 2021-03-19 MED ORDER — CEFAZOLIN SODIUM-DEXTROSE 2-4 GM/100ML-% IV SOLN
INTRAVENOUS | Status: AC
  Filled 2021-03-19: qty 100

## 2021-03-19 MED ORDER — FENTANYL CITRATE (PF) 100 MCG/2ML IJ SOLN
INTRAMUSCULAR | Status: DC | PRN
  Administered 2021-03-19: 50 ug via INTRAVENOUS

## 2021-03-19 MED ORDER — ACETIC ACID 3 % SOLN
Status: AC
  Filled 2021-03-19: qty 500

## 2021-03-19 MED ORDER — ACETAMINOPHEN 10 MG/ML IV SOLN
INTRAVENOUS | Status: AC
  Filled 2021-03-19: qty 100

## 2021-03-19 MED ORDER — CEFAZOLIN SODIUM-DEXTROSE 2-4 GM/100ML-% IV SOLN
2.0000 g | Freq: Once | INTRAVENOUS | Status: AC
  Administered 2021-03-19: 2 g via INTRAVENOUS

## 2021-03-19 MED ORDER — LIDOCAINE HCL (CARDIAC) PF 100 MG/5ML IV SOSY
PREFILLED_SYRINGE | INTRAVENOUS | Status: DC | PRN
Start: 2021-03-19 — End: 2021-03-19
  Administered 2021-03-19: 100 mg via INTRAVENOUS

## 2021-03-19 MED ORDER — LACTATED RINGERS IV SOLN: INTRAVENOUS | Status: DC

## 2021-03-19 MED ORDER — CHLORHEXIDINE GLUCONATE 0.12 % MT SOLN
15.0000 mL | Freq: Once | OROMUCOSAL | Status: AC
Start: 2021-03-19 — End: 2021-03-19

## 2021-03-19 MED ORDER — SEVOFLURANE IN SOLN
RESPIRATORY_TRACT | Status: AC
  Filled 2021-03-19: qty 250

## 2021-03-19 MED ORDER — CHLORHEXIDINE GLUCONATE 0.12 % MT SOLN
OROMUCOSAL | Status: AC
  Administered 2021-03-19: 15 mL via OROMUCOSAL
  Filled 2021-03-19: qty 15

## 2021-03-19 MED ORDER — BACITRACIN ZINC 500 UNIT/GM EX OINT
TOPICAL_OINTMENT | CUTANEOUS | Status: DC | PRN
  Administered 2021-03-19: 1 via TOPICAL

## 2021-03-19 MED ORDER — BACITRACIN ZINC 500 UNIT/GM EX OINT
TOPICAL_OINTMENT | CUTANEOUS | Status: AC
  Filled 2021-03-19: qty 28.35

## 2021-03-19 MED ORDER — DEXAMETHASONE SODIUM PHOSPHATE 10 MG/ML IJ SOLN
INTRAMUSCULAR | Status: DC | PRN
  Administered 2021-03-19: 5 mg via INTRAVENOUS

## 2021-03-19 MED ORDER — FENTANYL CITRATE (PF) 100 MCG/2ML IJ SOLN
25.0000 ug | INTRAMUSCULAR | Status: DC | PRN
  Administered 2021-03-19: 50 ug via INTRAVENOUS

## 2021-03-19 MED ORDER — MIDAZOLAM HCL 2 MG/2ML IJ SOLN
INTRAMUSCULAR | Status: DC | PRN
  Administered 2021-03-19 (×2): 1 mg via INTRAVENOUS

## 2021-03-19 MED ORDER — HYDROCODONE-ACETAMINOPHEN 5-325 MG PO TABS: 1.0000 | ORAL_TABLET | Freq: Four times a day (QID) | ORAL | 0 refills | Status: DC | PRN

## 2021-03-19 MED ORDER — PROPOFOL 10 MG/ML IV BOLUS
INTRAVENOUS | Status: DC | PRN
  Administered 2021-03-19: 200 mg via INTRAVENOUS

## 2021-03-19 MED ORDER — FENTANYL CITRATE (PF) 100 MCG/2ML IJ SOLN
INTRAMUSCULAR | Status: AC
  Filled 2021-03-19: qty 2

## 2021-03-19 MED ORDER — FENTANYL CITRATE (PF) 100 MCG/2ML IJ SOLN
INTRAMUSCULAR | Status: AC
  Administered 2021-03-19: 50 ug via INTRAVENOUS
  Filled 2021-03-19: qty 2

## 2021-03-19 MED ORDER — PHENYLEPHRINE HCL (PRESSORS) 10 MG/ML IV SOLN
INTRAVENOUS | Status: DC | PRN
Start: 2021-03-19 — End: 2021-03-19
  Administered 2021-03-19: 100 ug via INTRAVENOUS

## 2021-03-19 MED ORDER — 0.9 % SODIUM CHLORIDE (POUR BTL) OPTIME
TOPICAL | Status: DC | PRN
  Administered 2021-03-19: 5 mL

## 2021-03-19 MED ORDER — MIDAZOLAM HCL 2 MG/2ML IJ SOLN
INTRAMUSCULAR | Status: AC
  Filled 2021-03-19: qty 2

## 2021-03-19 MED ORDER — ONDANSETRON HCL 4 MG/2ML IJ SOLN
INTRAMUSCULAR | Status: DC | PRN
  Administered 2021-03-19: 4 mg via INTRAVENOUS

## 2021-03-19 SURGICAL SUPPLY — 21 items
BASIN GRAD PLASTIC 32OZ STRL (MISCELLANEOUS) ×2 IMPLANT
BLADE SURG 15 STRL LF DISP TIS (BLADE) ×1 IMPLANT
BLADE SURG 15 STRL SS (BLADE) ×2
CANISTER SUCT 1200ML W/VALVE (MISCELLANEOUS) IMPLANT
COVER BACK TABLE REUSABLE LG (DRAPES) ×2 IMPLANT
COVER LIGHT HANDLE STERIS (MISCELLANEOUS) ×4 IMPLANT
DRAPE LAPAROTOMY 100X77 ABD (DRAPES) ×2 IMPLANT
GAUZE PETROLATUM 1 X8 (GAUZE/BANDAGES/DRESSINGS) ×2 IMPLANT
GAUZE SPONGE 4X4 12PLY STRL (GAUZE/BANDAGES/DRESSINGS) ×4 IMPLANT
GLOVE SURG UNDER POLY LF SZ7.5 (GLOVE) ×2 IMPLANT
GOWN STRL REUS W/ TWL LRG LVL3 (GOWN DISPOSABLE) ×2 IMPLANT
GOWN STRL REUS W/ TWL XL LVL3 (GOWN DISPOSABLE) ×1 IMPLANT
GOWN STRL REUS W/TWL LRG LVL3 (GOWN DISPOSABLE) ×4
GOWN STRL REUS W/TWL XL LVL3 (GOWN DISPOSABLE) ×2
GRADUATE 1200CC STRL 31836 (MISCELLANEOUS) ×2 IMPLANT
KIT TURNOVER KIT A (KITS) ×2 IMPLANT
MANIFOLD NEPTUNE II (INSTRUMENTS) IMPLANT
NS IRRIG 500ML POUR BTL (IV SOLUTION) ×2 IMPLANT
SUT CHROMIC 3 0 SH 27 (SUTURE) ×2 IMPLANT
TOWEL OR 17X26 4PK STRL BLUE (TOWEL DISPOSABLE) ×2 IMPLANT
TUBING CONNECTING 10 (TUBING) ×2 IMPLANT

## 2021-03-19 NOTE — Anesthesia Procedure Notes (Signed)
Procedure Name: LMA Insertion Date/Time: 03/19/2021 9:23 AM Performed by: Henrietta Hoover, CRNA Pre-anesthesia Checklist: Patient identified, Emergency Drugs available, Suction available and Patient being monitored Patient Re-evaluated:Patient Re-evaluated prior to induction Oxygen Delivery Method: Circle system utilized Preoxygenation: Pre-oxygenation with 100% oxygen Induction Type: IV induction Ventilation: Mask ventilation without difficulty LMA: LMA inserted LMA Size: 4.0 Number of attempts: 1 Placement Confirmation: positive ETCO2 and breath sounds checked- equal and bilateral Tube secured with: Tape Dental Injury: Teeth and Oropharynx as per pre-operative assessment

## 2021-03-19 NOTE — H&P (Signed)
Urology H&P   History of Present Illness: Joshua [name] is a 30 y.o. 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  Previously postponed and rescheduled secondary to + cocaine on urine drug screen.  UDS today clear  Past Medical History:  Diagnosis Date   Acute renal failure (ARF) (HCC)    History of    Aspiration pneumonia (HCC)    Hx of   Asthma    Bronchitis    Cardiac arrest (HCC)    hx of   Cardiogenic shock (HCC)    hx of   Drug overdose    Hernia cerebri (HCC)    History of acute respiratory failure    Hypertension    Neuropathy    Polysubstance abuse (HCC)    Rhabdomyolysis    Seizures (HCC)     Past Surgical History:  Procedure Laterality Date   AMPUTATION Right 11/03/2017   Procedure: KNEE DISARTICULATION;  Surgeon: Renford Dills, MD;  Location: [facility];  Service: Vascular;  Laterality: Right;   AMPUTATION Right 11/10/2017   Procedure: AMPUTATION ABOVE KNEE;  Surgeon: Renford Dills, MD;  Location: [facility];  Service: Vascular;  Laterality: Right;   ankle/foot surgery with metal plates     CERVICAL SPINE SURGERY     DIALYSIS/PERMA CATHETER INSERTION N/A 11/16/2017   Procedure: DIALYSIS/PERMA CATHETER INSERTION;  Surgeon: Annice Needy, MD;  Location: [facility];  Service: Cardiovascular;  Laterality: N/A;   DIALYSIS/PERMA CATHETER REMOVAL N/A 12/14/2017   Procedure: DIALYSIS/PERMA CATHETER REMOVAL;  Surgeon: Annice Needy, MD;  Location: [facility];  Service: Cardiovascular;  Laterality: N/A;   permacath to right side     Scheduled to be removed    Home Medications:  Current Meds  Medication Sig   albuterol (VENTOLIN HFA) 108 (90 Base) MCG/ACT inhaler Inhale 2 puffs into the lungs every 6 (six) hours as needed for wheezing or shortness of breath.   ALPRAZolam (XANAX) 0.25 MG tablet Take 0.25 mg by mouth daily as needed for anxiety (Depression).   gabapentin (NEURONTIN) 300  MG capsule Take 1 capsule (300 mg total) by mouth 3 (three) times daily. (Patient taking differently: Take 600 mg by mouth 4 (four) times daily.)   lamoTRIgine (LAMICTAL) 100 MG tablet Take 100 mg by mouth 2 (two) times daily.    Allergies:  Allergies  Allergen Reactions   Codeine Other (See Comments)    seizures   Zoloft [Sertraline] Hives and Rash    Family History  Problem Relation Age of Onset   Depression Mother    Bipolar disorder Mother    Varicose Veins Neg Hx     Social History:  reports that he has been smoking cigarettes. He has been smoking an average of .5 packs per day. He has never used smokeless tobacco. He reports previous drug use. Drugs: Cocaine and Methamphetamines. He reports that he does not drink alcohol.  ROS: A complete review of systems was performed.  All systems are negative except for pertinent findings as noted.  Physical Exam:  Vital signs in last 24 hours: Temp:  [97.4 F (36.3 C)] 97.4 F (36.3 C) (07/26 0758) Pulse Rate:  [91] 91 (07/26 0758) Resp:  [16] 16 (07/26 0758) BP: (143)/(87) 143/87 (07/26 0758) SpO2:  [100 %] 100 % (07/26 0758) Weight:  [79.4 kg] 79.4 kg (07/26 0758) Constitutional:  Alert and oriented, No acute distress HEENT: [place] AT, moist mucus membranes.  Trachea midline, no masses Cardiovascular: Regular rate and rhythm, no clubbing, cyanosis, or edema. Respiratory: Normal respiratory effort, lungs clear bilaterally Psychiatric: Normal mood and affect   Impression/Assessment:  Condyloma acuminata penis/scrotum  Plan:  Excision and CO2 laser vaporization   03/19/2021, 8:49 AM  Irineo Axon,  MD

## 2021-03-19 NOTE — Interval H&P Note (Signed)
History and Physical Interval Note:  03/19/2021 8:55 AM  Joshua [name]  has presented today for surgery, with the diagnosis of Condylomata acuminata external genitalia.  The various methods of treatment have been discussed with the patient and family. After consideration of risks, benefits and other options for treatment, the patient has consented to  Procedure(s): CONDYLOMA REMOVAL WITH Co2 LASER (N/A) as a surgical intervention.  The patient's history has been reviewed, patient examined, no change in status, stable for surgery.  I have reviewed the patient's chart and labs.  Questions were answered to the patient's satisfaction.     Alisson Rozell C Thersea Manfredonia

## 2021-03-19 NOTE — Transfer of Care (Signed)
Immediate Anesthesia Transfer of Care Note  Patient: Joshua [name]  Procedure(s) Performed: CONDYLOMA REMOVAL WITH Co2 LASER (Perineum)  Patient Location: PACU  Anesthesia Type:General  Level of Consciousness: awake, drowsy and patient cooperative  Airway & Oxygen Therapy: Patient Spontanous Breathing  Post-op Assessment: Report given to RN, Post -op Vital signs reviewed and stable and Patient moving all extremities  Post vital signs: Reviewed and stable  Last Vitals:  Vitals Value Taken Time  BP 153/92 03/19/21 1015  Temp 36.3 C 03/19/21 1009  Pulse 83 03/19/21 1018  Resp 16 03/19/21 1018  SpO2 98 % 03/19/21 1018  Vitals shown include unvalidated device data.  Last Pain:  Vitals:   03/19/21 1009  TempSrc:   PainSc: 0-No pain         Complications: No notable events documented.

## 2021-03-19 NOTE — Progress Notes (Signed)
Informed Dr. Lonna Cobb pt would like to speak to him about home pain medication.

## 2021-03-19 NOTE — Anesthesia Postprocedure Evaluation (Signed)
Anesthesia Post Note  Patient: Joshua [name]  Procedure(s) Performed: CONDYLOMA REMOVAL WITH Co2 LASER (Perineum)  Patient location during evaluation: PACU Anesthesia Type: General Level of consciousness: awake and alert Pain management: pain level controlled Vital Signs Assessment: post-procedure vital signs reviewed and stable Respiratory status: spontaneous breathing, nonlabored ventilation, respiratory function stable and patient connected to nasal cannula oxygen Cardiovascular status: blood pressure returned to baseline and stable Postop Assessment: no apparent nausea or vomiting Anesthetic complications: no   No notable events documented.   Last Vitals:  Vitals:   03/19/21 1115 03/19/21 1144  BP: 137/85 (!) 138/92  Pulse: 75 78  Resp: 15 18  Temp: 36.8 C   SpO2: 99% 99%    Last Pain:  Vitals:   03/19/21 1144  TempSrc:   PainSc: [address]

## 2021-03-19 NOTE — Discharge Instructions (Addendum)
Ibuprofen or Tylenol should suffice for pain Apply bacitracin ointment to treated areas twice daily Abstain from intercourse for 2 weeks Contact [facility] [phone] for any increasing redness, swelling or drainage from treated areas You may shower in 24 hours No tub bath, hot tub or pool for 2 weeks We will be contacted for a follow-up appointment in approximately 1 month    AMBULATORY SURGERY  DISCHARGE INSTRUCTIONS   The drugs that you were given will stay in your system until tomorrow so for the next 24 hours you should not:  Drive an automobile Make any legal decisions Drink any alcoholic beverage   You may resume regular meals tomorrow.  Today it is better to start with liquids and gradually work up to solid foods.  You may eat anything you prefer, but it is better to start with liquids, then soup and crackers, and gradually work up to solid foods.   Please notify your doctor immediately if you have any unusual bleeding, trouble breathing, redness and pain at the surgery site, drainage, fever, or pain not relieved by medication.    Additional Instructions:   Please contact your physician with any problems or Same Day Surgery at [phone], Monday through Friday 6 am to 4 pm, or [place] number at [phone].

## 2021-03-19 NOTE — Progress Notes (Signed)
Pt is very ill mannered and asking to take every thing off and not wanting to keep everything on him. Took BP cuff off nad ripped off tele monitors. Requested IV out or he would rip it out. He is cussing and getting mad bc he is requesting narcotics.

## 2021-03-19 NOTE — Progress Notes (Signed)
Arrived to postop very agitated. Refused to go over discharge instructions. Given as handout. Refused to wait to see Dr Lonna Cobb or to be wheeled out. Discharged to his mother Joshua [name]).

## 2021-03-19 NOTE — Anesthesia Preprocedure Evaluation (Signed)
Anesthesia Evaluation  Patient identified by MRN, date of birth, ID band Patient awake    Reviewed: Allergy & Precautions, NPO status , Patient's Chart, lab work & pertinent test results  Airway Mallampati: III  TM Distance: >3 FB Neck ROM: full    Dental no notable dental hx. (+) Teeth Intact   Pulmonary asthma , pneumonia, resolved, Current Smoker and Patient abstained from smoking.,    Pulmonary exam normal        Cardiovascular hypertension, negative cardio ROS Normal cardiovascular exam     Neuro/Psych Seizures -, Well Controlled,  PSYCHIATRIC DISORDERS Anxiety Depression    GI/Hepatic negative GI ROS, Neg liver ROS,   Endo/Other  negative endocrine ROS  Renal/GU Renal diseaseHistory or ARF     Musculoskeletal   Abdominal   Peds  Hematology negative hematology ROS (+)   Anesthesia Other Findings Past Medical History: No date: Acute renal failure (ARF) (HCC)     Comment:  History of  No date: Aspiration pneumonia (HCC)     Comment:  Hx of No date: Asthma No date: Bronchitis No date: Cardiac arrest (HCC)     Comment:  hx of No date: Cardiogenic shock (HCC)     Comment:  hx of No date: Drug overdose No date: Hernia cerebri (HCC) No date: History of acute respiratory failure No date: Hypertension No date: Neuropathy No date: Polysubstance abuse (HCC) No date: Rhabdomyolysis No date: Seizures [facility])  Past Surgical History: 11/03/2017: AMPUTATION; Right     Comment:  Procedure: KNEE DISARTICULATION;  Surgeon: Renford Dills, MD;  Location: [facility];  Service: Vascular;                Laterality: Right; 11/10/2017: AMPUTATION; Right     Comment:  Procedure: AMPUTATION ABOVE KNEE;  Surgeon: Renford Dills, MD;  Location: [facility];  Service: Vascular;                Laterality: Right; No date: ankle/foot surgery with metal plates No date: CERVICAL SPINE  SURGERY 11/16/2017: DIALYSIS/PERMA CATHETER INSERTION; N/A     Comment:  Procedure: DIALYSIS/PERMA CATHETER INSERTION;  Surgeon:               Annice Needy, MD;  Location: [facility];                Service: Cardiovascular;  Laterality: N/A; 12/14/2017: DIALYSIS/PERMA CATHETER REMOVAL; N/A     Comment:  Procedure: DIALYSIS/PERMA CATHETER REMOVAL;  Surgeon:               Annice Needy, MD;  Location: [facility];                Service: Cardiovascular;  Laterality: N/A; No date: permacath to right side     Comment:  Scheduled to be removed  BMI    Body Mass Index: 25.11 kg/m      Reproductive/Obstetrics negative OB ROS                             Anesthesia Physical Anesthesia Plan  ASA: 2  Anesthesia Plan: General ETT and General   Post-op Pain Management:    Induction: Intravenous  PONV Risk Score and Plan: Ondansetron, Dexamethasone, Midazolam and Treatment may vary due  to age or medical condition  Airway Management Planned: Oral ETT  Additional Equipment:   Intra-op Plan:   Post-operative Plan: Extubation in OR  Informed Consent: I have reviewed the patients History and Physical, chart, labs and discussed the procedure including the risks, benefits and alternatives for the proposed anesthesia with the patient or authorized representative who has indicated his/her understanding and acceptance.     Dental Advisory Given  Plan Discussed with: Anesthesiologist, CRNA and Surgeon  Anesthesia Plan Comments: (Patient consented for risks of anesthesia including but not limited to:  - adverse reactions to medications - damage to eyes, teeth, lips or other oral mucosa - nerve damage due to positioning  - sore throat or hoarseness - Damage to heart, brain, nerves, lungs, other parts of body or loss of life  Patient voiced understanding.)        Anesthesia Quick Evaluation

## 2021-03-19 NOTE — Op Note (Signed)
Preoperative diagnosis:  Penile/scrotal condyloma acuminata  Postoperative diagnosis:  Same  Procedure: Laser ablation of condyloma acuminata Excision stable penile condyloma acuminata  Surgeon: Riki Altes, MD  Anesthesia: General  Complications: None  Intraoperative findings:  5 scattered anterior/lateral scrotal lesions consistent with condyloma acuminata; largest measuring 3 mm 5 penile/suprapubic lesions, largest with an aggregate cluster penopubic region measuring ~ 1 cm  EBL: Minimal  Specimens: Penile condyloma acuminata  Indication: Joshua [name] is a 30 y.o. patient with a long history of penile skin/scrotal condyloma acuminata.  After reviewing the management options for treatment, he elected to proceed with the above surgical procedure(s). We have discussed the potential benefits and risks of the procedure, side effects of the proposed treatment, the likelihood of the patient achieving the goals of the procedure, and any potential problems that might occur during the procedure or recuperation. Informed consent has been obtained.  Description of procedure:  The patient was taken to the operating room and general anesthesia was induced.  The patient was placed in the supine, prepped and draped in the usual sterile fashion, and preoperative antibiotics were administered. A preoperative time-out was performed.   Using a continuous CO2 laser the smaller penile and scrotal lesions were ablated at initial setting of 4 W which was increased 8 W.  The cluster of lesions at the dorsal penopubic area was excised with a 15 blade via an ellipse.  Hemostasis was obtained with a defocused CO2 laser.  The skin was closed with interrupted 3-0 chromic suture  Bacitracin ointment was applied.  After anesthetic reversal he was transported to PACU in stable condition.  Plan: Office follow-up 1 month   Riki Altes, M.D.

## 2021-03-20 ENCOUNTER — Ambulatory Visit: Payer: Medicare Other | Admitting: Physical Therapy

## 2021-03-20 LAB — SURGICAL PATHOLOGY

## 2021-03-21 ENCOUNTER — Other Ambulatory Visit: Payer: Self-pay

## 2021-03-21 ENCOUNTER — Ambulatory Visit: Payer: Medicare Other | Admitting: Physical Therapy

## 2021-03-21 DIAGNOSIS — M542 Cervicalgia: Secondary | ICD-10-CM | POA: Diagnosis not present

## 2021-03-21 DIAGNOSIS — M6281 Muscle weakness (generalized): Secondary | ICD-10-CM
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
## 2021-03-24 ENCOUNTER — Other Ambulatory Visit: Payer: Self-pay

## 2021-03-24 ENCOUNTER — Emergency Department
Admission: EM | Admit: 2021-03-24 | Discharge: 2021-03-24 | Disposition: A | Discharge status: Home / Self Care | Payer: Medicare Other | Attending: Emergency Medicine | Admitting: Emergency Medicine

## 2021-03-24 ENCOUNTER — Encounter: Payer: Self-pay | Admitting: Emergency Medicine

## 2021-03-24 DIAGNOSIS — I10 Essential (primary) hypertension: Secondary | ICD-10-CM | POA: Insufficient documentation

## 2021-03-24 DIAGNOSIS — N5089 Other specified disorders of the male genital organs: Secondary | ICD-10-CM | POA: Insufficient documentation

## 2021-03-24 DIAGNOSIS — F17210 Nicotine dependence, cigarettes, uncomplicated: Secondary | ICD-10-CM | POA: Insufficient documentation

## 2021-03-24 DIAGNOSIS — T8131XA Disruption of external operation (surgical) wound, not elsewhere classified, initial encounter: Secondary | ICD-10-CM | POA: Diagnosis present

## 2021-03-24 DIAGNOSIS — J45909 Unspecified asthma, uncomplicated: Secondary | ICD-10-CM | POA: Diagnosis not present

## 2021-03-24 MED ORDER — HYDROCODONE-ACETAMINOPHEN 5-325 MG PO TABS
1.0000 | ORAL_TABLET | Freq: Once | ORAL | Status: AC
Start: 2021-03-24 — End: 2021-03-24
  Administered 2021-03-24: 1 via ORAL
  Filled 2021-03-24: qty 1

## 2021-03-24 NOTE — ED Triage Notes (Signed)
Pt to ED via POV, states recently had condyloma removal on 7/26, states tonight was bathing himself and "ripped it open". Pt states some bleeding noted at this time. Pt states pain 9/10.

## 2021-03-24 NOTE — ED Provider Notes (Signed)
[facility] Emergency Department Provider Note  ____________________________________________   Event Date/Time   First MD Initiated Contact with Patient 03/24/21 0405     (approximate)  I have reviewed the triage vital signs and the nursing notes.   HISTORY  Chief Complaint Post-op Problem    HPI Joshua [name] is a 30 y.o. 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  States that he noticed the surgical incision site just above the shaft of his penis opened up tonight when he was in the shower.  He states that he thinks that the stitch came out.  No bleeding or drainage.  No fever.  Requesting something for pain.     Op Note 03/19/21: Preoperative diagnosis:  Penile/scrotal condyloma acuminata   Postoperative diagnosis:  Same   Procedure: Laser ablation of condyloma acuminata Excision stable penile condyloma acuminata   Surgeon: Riki Altes, MD   Anesthesia: General   Complications: None   Intraoperative findings:  5 scattered anterior/lateral scrotal lesions consistent with condyloma acuminata; largest measuring 3 mm 5 penile/suprapubic lesions, largest with an aggregate cluster penopubic region measuring ~ 1 cm    Past Medical History:  Diagnosis Date   Acute renal failure (ARF) (HCC)    History of    Aspiration pneumonia (HCC)    Hx of   Asthma    Bronchitis    Cardiac arrest (HCC)    hx of   Cardiogenic shock (HCC)    hx of   Drug overdose    Hernia cerebri (HCC)    History of acute respiratory failure    Hypertension    Neuropathy    Polysubstance abuse (HCC)    Rhabdomyolysis    Seizures (HCC)     Patient Active Problem List   Diagnosis Date Noted   Seasonal allergies 12/09/2019   Skin lesion 12/09/2019   Screening for blood or protein in urine 12/09/2019   Body mass index 26.0-26.9, adult  12/09/2019   Anxiety 10/21/2019   Ear fullness, bilateral 10/21/2019   History of seizures 10/21/2019   MDD (major depressive disorder), single episode, moderate (HCC) 03/30/2019   GAD (generalized anxiety disorder) 03/30/2019   Tobacco use disorder 03/30/2019   Cocaine use disorder, severe, in sustained remission (HCC) 03/30/2019   Opioid use disorder, moderate, in sustained remission (HCC) 03/30/2019   Leg pain, anterior, left 12/06/2018   S/P AKA (above knee amputation), 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   Impotence 12/08/2017   Low back pain 12/08/2017   Adjustment disorder with mixed anxiety and depressed mood 11/13/2017   At risk for abuse of opiates 11/13/2017   Acute kidney failure, unspecified (HCC) 11/02/2017   Rhabdomyolysis 11/02/2017   Acute respiratory failure (HCC) 11/02/2017   Cardiogenic shock (HCC) 10/30/2017   Cardiac arrest (HCC) 10/30/2017   Drug abuse (HCC) 10/30/2017    Past Surgical History:  Procedure Laterality Date   AMPUTATION Right 11/03/2017   Procedure: KNEE DISARTICULATION;  Surgeon: Renford Dills, MD;  Location: [facility];  Service: Vascular;  Laterality: Right;   AMPUTATION Right 11/10/2017   Procedure: AMPUTATION ABOVE KNEE;  Surgeon: Renford Dills, MD;  Location: [facility];  Service: Vascular;  Laterality: Right;   ankle/foot surgery with metal plates     CERVICAL SPINE SURGERY     CONDYLOMA EXCISION/FULGURATION N/A 03/19/2021   Procedure: CONDYLOMA REMOVAL WITH Co2 LASER;  Surgeon: Riki Altes,  MD;  Location: [facility];  Service: Urology;  Laterality: N/A;   DIALYSIS/PERMA CATHETER INSERTION N/A 11/16/2017   Procedure: DIALYSIS/PERMA CATHETER INSERTION;  Surgeon: Annice Needyew, Jason S, MD;  Location: [facility];  Service: Cardiovascular;  Laterality: N/A;   DIALYSIS/PERMA CATHETER REMOVAL N/A 12/14/2017   Procedure: DIALYSIS/PERMA CATHETER REMOVAL;  Surgeon: Annice Needyew, Jason S, MD;  Location: [facility];  Service: Cardiovascular;  Laterality:  N/A;   permacath to right side     Scheduled to be removed    Prior to Admission medications   Medication Sig Start Date End Date Taking? Authorizing Provider  albuterol (VENTOLIN HFA) 108 (90 Base) MCG/ACT inhaler Inhale 2 puffs into the lungs every 6 (six) hours as needed for wheezing or shortness of breath. 10/25/20   Fisher, Roselyn BeringSusan W, PA-C  ALPRAZolam Prudy Feeler(XANAX) 0.25 MG tablet Take 0.25 mg by mouth daily as needed for anxiety (Depression). 01/29/21   [provider]  gabapentin (NEURONTIN) 300 MG capsule Take 1 capsule (300 mg total) by mouth 3 (three) times daily. Patient taking differently: Take 600 mg by mouth 4 (four) times daily. 12/06/18   Georgiana SpinnerBrown, Fallon E, NP  HYDROcodone-acetaminophen (NORCO/VICODIN) 5-325 MG tablet Take 1 tablet by mouth every 6 (six) hours as needed for moderate pain. 03/19/21   Stoioff, Verna CzechScott C, MD  lamoTRIgine (LAMICTAL) 100 MG tablet Take 100 mg by mouth 2 (two) times daily. 02/04/21   [provider]  Misc. Devices MISC Right leg Prosthetic. Dx: Right AKA 05/05/19   Hoy RegisterNewlin, Enobong, MD    Allergies Codeine and Zoloft [sertraline]  Family History  Problem Relation Age of Onset   Depression Mother    Bipolar disorder Mother    Varicose Veins Neg Hx     Social History Social History   Tobacco Use   Smoking status: Every Day    Packs/day: 0.50    Types: Cigarettes   Smokeless tobacco: Never  Vaping Use   Vaping Use: Never used  Substance Use Topics   Alcohol use: No   Drug use: Not Currently    Types: Cocaine, Methamphetamines    Comment: states "none since last time when surgery was canceled"    Review of Systems Constitutional: No fever. Eyes: No visual changes. ENT: No sore throat. Cardiovascular: Denies chest pain. Respiratory: Denies shortness of breath. Gastrointestinal: No nausea, vomiting, diarrhea. Genitourinary: Negative for dysuria. Musculoskeletal: Negative for back pain. Skin: Negative for rash. Neurological:  Negative for focal weakness or numbness.  ____________________________________________   PHYSICAL EXAM:  VITAL SIGNS: ED Triage Vitals  Enc Vitals Group     BP 03/24/21 0310 (!) 151/89     Pulse Rate 03/24/21 0310 93     Resp 03/24/21 0310 17     Temp 03/24/21 0310 98.7 F (37.1 C)     Temp Source 03/24/21 0310 Oral     SpO2 03/24/21 0310 97 %     Weight 03/24/21 0255 175 lb 0.7 oz (79.4 kg)     Height 03/24/21 0255 5\' 10"  (1.778 m)     Head Circumference --      Peak Flow --      Pain Score 03/24/21 0255 9     Pain Loc --      Pain Edu? --      Excl. in GC? --    CONSTITUTIONAL: Alert and oriented and responds appropriately to questions. Well-appearing; well-nourished HEAD: Normocephalic EYES: Conjunctivae clear, pupils appear equal, EOM appear intact ENT: normal nose; moist mucous  membranes NECK: Supple, normal ROM CARD: RRR; S1 and S2 appreciated; no murmurs, no clicks, no rubs, no gallops RESP: Normal chest excursion without splinting or tachypnea; breath sounds clear and equal bilaterally; no wheezes, no rhonchi, no rales, no hypoxia or respiratory distress, speaking full sentences ABD/GI: Normal bowel sounds; non-distended; soft, non-tender, no rebound, no guarding, no peritoneal signs, no hepatosplenomegaly GU: Patient has an approximately 0.5 cm area of wound dehiscence just above the shaft of the penis at the penopubic region that is superficial in nature without any bleeding, drainage, surrounding redness or warmth.  No fluctuance or induration. BACK: The back appears normal EXT: Normal ROM in all joints; no deformity noted, no edema; no cyanosis SKIN: Normal color for age and race; warm; no rash on exposed skin NEURO: Moves all extremities equally PSYCH: The patient's mood and manner are appropriate.  ____________________________________________   LABS (all labs ordered are listed, but only abnormal results are displayed)  Labs Reviewed - No data to  display ____________________________________________  EKG   ____________________________________________  RADIOLOGY I, Deforest Maiden, personally viewed and evaluated these images (plain radiographs) as part of my medical decision making, as well as reviewing the written report by the radiologist.  ED MD interpretation:    Official radiology report(s): No results found.  ____________________________________________   PROCEDURES  Procedure(s) performed (including Critical Care):  Procedures    ____________________________________________   INITIAL IMPRESSION / ASSESSMENT AND PLAN / ED COURSE  As part of my medical decision making, I reviewed the following data within the electronic MEDICAL RECORD NUMBER Nursing notes reviewed and incorporated, Old chart reviewed, and Notes from prior ED visits         Patient here with area of wound dehiscence.  No signs of superimposed infection or bleeding.  Area has been cleaned and closed using Steri-Strips.  Have recommended close follow-up with urology.  Discussed wound care instructions and return precautions.  Provided with pain medication here in the ED.  At this time, I do not feel there is any life-threatening condition present. I have reviewed, interpreted and discussed all results (EKG, imaging, lab, urine as appropriate) and exam findings with patient/family. I have reviewed nursing notes and appropriate previous records.  I feel the patient is safe to be discharged home without further emergent workup and can continue workup as an outpatient as needed. Discussed usual and customary return precautions. Patient/family verbalize understanding and are comfortable with this plan.  Outpatient follow-up has been provided as needed. All questions have been answered.  ____________________________________________   FINAL CLINICAL IMPRESSION(S) / ED DIAGNOSES  Final diagnoses:  Postoperative wound dehiscence, initial encounter     ED  Discharge Orders     None       *Please note:  Joshua [name] was evaluated in Emergency Department on 03/24/2021 for the symptoms described in the history of present illness. He was evaluated in the context of the global COVID-19 pandemic, which necessitated consideration that the patient might be at risk for infection with the SARS-CoV-2 virus that causes COVID-19. Institutional protocols and algorithms that pertain to the evaluation of patients at risk for COVID-19 are in a state of rapid change based on information released by regulatory bodies including the CDC and federal and state organizations. These policies and algorithms were followed during the patient's care in the ED.  Some ED evaluations and interventions may be delayed as a result of limited staffing during and the pandemic.*   Note:  This document was prepared  using Conservation officer, historic buildings and may include unintentional dictation errors.    Parv Manthey, Layla Maw, DO 03/24/21 [phone]

## 2021-03-24 NOTE — ED Notes (Signed)
2 steri strips applied to open wound

## 2021-03-25 ENCOUNTER — Ambulatory Visit (INDEPENDENT_AMBULATORY_CARE_PROVIDER_SITE_OTHER): Payer: Medicare Other | Admitting: Urology

## 2021-03-25 ENCOUNTER — Encounter: Payer: Self-pay | Admitting: Urology

## 2021-03-25 VITALS — BP 136/82 | HR 80 | Ht 70.0 in | Wt 170.0 lb

## 2021-03-25 DIAGNOSIS — T8149XA Infection following a procedure, other surgical site, initial encounter: Secondary | ICD-10-CM

## 2021-03-25 NOTE — Progress Notes (Signed)
03/25/2021 10:45 AM   Joshua [name] 12/02/1990 [id number]  Referring provider: Dorothey Baseman, MD [id number] [place],  [place] [address]  Chief Complaint  Patient presents with   Wound Check    HPI: On July 26 patient had 5 scrotal and penile and suprapubic condyloma removed.  Laser ablation and excision was utilized.  He came in today with wound issues.  He went to the emergency room yesterday with wound dehiscence.  It had just opened while he was showering that evening.  There was no signs of of infection and Steri-Strips were applied.  He had mild separation of a 1.5 cm incision at the base of the nose where it interface with his lower abdomen.  He had 1 similar separation that was more superficial in the pubic area.  No infections.  Incision looks healthy and granulating.  PMH: Past Medical History:  Diagnosis Date   Acute renal failure (ARF) (HCC)    History of    Aspiration pneumonia (HCC)    Hx of   Asthma    Bronchitis    Cardiac arrest (HCC)    hx of   Cardiogenic shock (HCC)    hx of   Drug overdose    Hernia cerebri (HCC)    History of acute respiratory failure    Hypertension    Neuropathy    Polysubstance abuse (HCC)    Rhabdomyolysis    Seizures (HCC)     Surgical History: Past Surgical History:  Procedure Laterality Date   AMPUTATION Right 11/03/2017   Procedure: KNEE DISARTICULATION;  Surgeon: Renford Dills, MD;  Location: [facility];  Service: Vascular;  Laterality: Right;   AMPUTATION Right 11/10/2017   Procedure: AMPUTATION ABOVE KNEE;  Surgeon: Renford Dills, MD;  Location: [facility];  Service: Vascular;  Laterality: Right;   ankle/foot surgery with metal plates     CERVICAL SPINE SURGERY     CONDYLOMA EXCISION/FULGURATION N/A 03/19/2021   Procedure: CONDYLOMA REMOVAL WITH Co2 LASER;  Surgeon: Riki Altes, MD;  Location: [facility];  Service: Urology;  Laterality: N/A;   DIALYSIS/PERMA CATHETER INSERTION N/A 11/16/2017   Procedure:  DIALYSIS/PERMA CATHETER INSERTION;  Surgeon: Annice Needy, MD;  Location: [facility];  Service: Cardiovascular;  Laterality: N/A;   DIALYSIS/PERMA CATHETER REMOVAL N/A 12/14/2017   Procedure: DIALYSIS/PERMA CATHETER REMOVAL;  Surgeon: Annice Needy, MD;  Location: [facility];  Service: Cardiovascular;  Laterality: N/A;   permacath to right side     Scheduled to be removed    Home Medications:  Allergies as of 03/25/2021       Reactions   Codeine Other (See Comments)   seizures   Zoloft [sertraline] Hives, Rash        Medication List        Accurate as of March 25, 2021 10:45 AM. If you have any questions, ask your nurse or doctor.          albuterol 108 (90 Base) MCG/ACT inhaler Commonly known as: VENTOLIN HFA Inhale 2 puffs into the lungs every 6 (six) hours as needed for wheezing or shortness of breath.   ALPRAZolam 0.25 MG tablet Commonly known as: XANAX Take 0.25 mg by mouth daily as needed for anxiety (Depression).   gabapentin 300 MG capsule Commonly known as: NEURONTIN Take 1 capsule (300 mg total) by mouth 3 (three) times daily. What changed:  how much to take when to take this   HYDROcodone-acetaminophen 5-325 MG  tablet Commonly known as: NORCO/VICODIN Take 1 tablet by mouth every 6 (six) hours as needed for moderate pain.   lamoTRIgine 100 MG tablet Commonly known as: LAMICTAL Take 100 mg by mouth 2 (two) times daily.   Misc. Devices Misc Right leg Prosthetic. Dx: Right AKA        Allergies:  Allergies  Allergen Reactions   Codeine Other (See Comments)    seizures   Zoloft [Sertraline] Hives and Rash    Family History: Family History  Problem Relation Age of Onset   Depression Mother    Bipolar disorder Mother    Varicose Veins Neg Hx     Social History:  reports that he has been smoking cigarettes. He has been smoking an average of .5 packs per day. He has never used smokeless tobacco. He reports previous drug use.  Drugs: Cocaine and Methamphetamines. He reports that he does not drink alcohol.  ROS:                                        Physical Exam: There were no vitals taken for this visit.  Constitutional:  Alert and oriented, No acute distress. HEENT: [place] AT, moist mucus membranes.  Trachea midline, no masses.  Laboratory Data: Lab Results  Component Value Date   WBC 14.5 (H) 10/19/2018   HGB 15.8 10/19/2018   HCT 48.0 10/19/2018   MCV 92.8 10/19/2018   PLT 291 10/19/2018    Lab Results  Component Value Date   CREATININE 1.04 10/19/2018    No results found for: PSA  No results found for: TESTOSTERONE  No results found for: HGBA1C  Urinalysis    Component Value Date/Time   COLORURINE YELLOW 10/19/2018 1740   APPEARANCEUR Clear 02/06/2021 1008   LABSPEC 1.014 10/19/2018 1740   LABSPEC 1.012 05/08/2014 2307   PHURINE 6.0 10/19/2018 1740   GLUCOSEU Negative 02/06/2021 1008   GLUCOSEU Negative 05/08/2014 2307   HGBUR NEGATIVE 10/19/2018 1740   BILIRUBINUR Negative 02/06/2021 1008   BILIRUBINUR Negative 05/08/2014 2307   KETONESUR NEGATIVE 10/19/2018 1740   PROTEINUR Negative 02/06/2021 1008   PROTEINUR NEGATIVE 10/19/2018 1740   NITRITE Negative 02/06/2021 1008   NITRITE NEGATIVE 10/19/2018 1740   LEUKOCYTESUR Negative 02/06/2021 1008   LEUKOCYTESUR NEGATIVE 10/19/2018 1740   LEUKOCYTESUR Negative 05/08/2014 2307    Pertinent Imaging:   Assessment & Plan: Assurance given.  Keep clean and dry.  Bacitracin ointment if needed twice a day  There are no diagnoses linked to this encounter.  No follow-ups on file.  Martina Sinner, MD  [facility] [address] [place], [place] [address] [phone]

## 2021-03-26 ENCOUNTER — Ambulatory Visit: Payer: Medicare Other | Attending: Neurosurgery | Admitting: Physical Therapy

## 2021-03-26 ENCOUNTER — Telehealth: Payer: Self-pay | Admitting: Physical Therapy

## 2021-03-26 DIAGNOSIS — M6281 Muscle weakness (generalized): Secondary | ICD-10-CM | POA: Insufficient documentation

## 2021-03-26 DIAGNOSIS — M542 Cervicalgia: Secondary | ICD-10-CM | POA: Insufficient documentation

## 2021-03-26 NOTE — Telephone Encounter (Signed)
Pt absent for appointment. Called and left VM inquiring about his absence.

## 2021-03-27 ENCOUNTER — Telehealth: Payer: Self-pay | Admitting: Physical Therapy

## 2021-03-27 ENCOUNTER — Ambulatory Visit: Payer: Medicare Other | Admitting: Physical Therapy

## 2021-03-27 NOTE — Telephone Encounter (Signed)
Called pt about his absence. Pt explained that he was absent because of his car breaking down and that he missed last apt because of memory issues. PT recommends he find way to remember apt times and offered to reprint schedule for him.

## 2021-03-28 ENCOUNTER — Ambulatory Visit: Payer: Medicare Other | Admitting: Physical Therapy

## 2021-04-02 ENCOUNTER — Ambulatory Visit: Payer: Medicare Other | Admitting: Physical Therapy

## 2021-04-02 DIAGNOSIS — M6281 Muscle weakness (generalized): Secondary | ICD-10-CM | POA: Diagnosis present

## 2021-04-02 DIAGNOSIS — M542 Cervicalgia: Secondary | ICD-10-CM
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
## 2021-04-04 ENCOUNTER — Ambulatory Visit: Payer: Medicare Other | Admitting: Physical Therapy

## 2021-04-04 DIAGNOSIS — M6281 Muscle weakness (generalized): Secondary | ICD-10-CM

## 2021-04-04 DIAGNOSIS — M542 Cervicalgia: Secondary | ICD-10-CM | POA: Diagnosis not present
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
## 2021-04-09 ENCOUNTER — Ambulatory Visit: Payer: Medicare Other | Admitting: Physical Therapy

## 2021-04-10 ENCOUNTER — Ambulatory Visit: Payer: Medicare Other | Admitting: Physical Therapy

## 2021-04-10 ENCOUNTER — Telehealth: Payer: Self-pay | Admitting: Physical Therapy

## 2021-04-10 NOTE — Telephone Encounter (Signed)
Called pt because of his absence from apt. He reports feeling sick and needing to cancel apt. PT also informed pt that he has no more apts scheduled and that he will need to schedule more. Pt reports that he will wait to do this tomorrow.

## 2021-04-11 ENCOUNTER — Ambulatory Visit: Payer: Medicare Other | Admitting: Physical Therapy

## 2021-04-11 DIAGNOSIS — M6281 Muscle weakness (generalized): Secondary | ICD-10-CM

## 2021-04-11 DIAGNOSIS — M542 Cervicalgia: Secondary | ICD-10-CM | POA: Diagnosis not present

## 2021-04-11 NOTE — Addendum Note (Signed)
Addended by: Johnn Hai on: 04/11/2021 04:28 PM   Modules accepted: Orders
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
## 2021-04-15 ENCOUNTER — Encounter: Payer: Medicare Other | Admitting: Physical Therapy

## 2021-04-15 ENCOUNTER — Telehealth: Payer: Self-pay | Admitting: Physical Therapy

## 2021-04-15 NOTE — Telephone Encounter (Signed)
Called pt about his absence from apt. He responded by saying he forgot and that he would like to be rescheduled for another time this week.

## 2021-04-16 ENCOUNTER — Ambulatory Visit: Payer: Medicare Other | Admitting: Physical Therapy

## 2021-04-16 DIAGNOSIS — M542 Cervicalgia: Secondary | ICD-10-CM

## 2021-04-16 DIAGNOSIS — M6281 Muscle weakness (generalized): Secondary | ICD-10-CM
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
## 2021-04-17 ENCOUNTER — Ambulatory Visit: Payer: Medicare Other | Admitting: Physical Therapy

## 2021-04-17 NOTE — Progress Notes (Deleted)
04/18/2021 8:18 AM   Joshua [name] 11/28/1990 [id number]  Referring provider: Dorothey Baseman, MD [phone] 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  HPI: Joshua [name] is a 30 y.o. male who presents today for wound recheck.      PMH: Past Medical History:  Diagnosis Date   Acute renal failure (ARF) (HCC)    History of    Aspiration pneumonia (HCC)    Hx of   Asthma    Bronchitis    Cardiac arrest (HCC)    hx of   Cardiogenic shock (HCC)    hx of   Drug overdose    Hernia cerebri (HCC)    History of acute respiratory failure    Hypertension    Neuropathy    Polysubstance abuse (HCC)    Rhabdomyolysis    Seizures (HCC)     Surgical History: Past Surgical History:  Procedure Laterality Date   AMPUTATION Right 11/03/2017   Procedure: KNEE DISARTICULATION;  Surgeon: Renford Dills, MD;  Location: [facility];  Service: Vascular;  Laterality: Right;   AMPUTATION Right 11/10/2017   Procedure: AMPUTATION ABOVE KNEE;  Surgeon: Renford Dills, MD;  Location: [facility];  Service: Vascular;  Laterality: Right;   ankle/foot surgery with metal plates     CERVICAL SPINE SURGERY     CONDYLOMA EXCISION/FULGURATION N/A 03/19/2021   Procedure: CONDYLOMA REMOVAL WITH Co2 LASER;  Surgeon: Riki Altes, MD;  Location: [facility];  Service: Urology;  Laterality: N/A;   DIALYSIS/PERMA CATHETER INSERTION N/A 11/16/2017   Procedure: DIALYSIS/PERMA CATHETER INSERTION;  Surgeon: Annice Needy, MD;  Location: [facility];  Service: Cardiovascular;  Laterality: N/A;   DIALYSIS/PERMA CATHETER REMOVAL N/A 12/14/2017   Procedure: DIALYSIS/PERMA CATHETER REMOVAL;  Surgeon: Annice Needy, MD;  Location: [facility];  Service: Cardiovascular;  Laterality: N/A;   permacath to right side      Scheduled to be removed    Home Medications:  Allergies as of 04/18/2021       Reactions   Codeine Other (See Comments)   seizures   Zoloft [sertraline] Hives, Rash        Medication List        Accurate as of April 17, 2021  8:18 AM. If you have any questions, ask your nurse or doctor.          albuterol 108 (90 Base) MCG/ACT inhaler Commonly known as: VENTOLIN HFA Inhale 2 puffs into the lungs every 6 (six) hours as needed for wheezing or shortness of breath.   ALPRAZolam 0.25 MG tablet Commonly known as: XANAX Take 0.25 mg by mouth daily as needed for anxiety (Depression).   gabapentin 300 MG capsule Commonly known as: NEURONTIN Take 1 capsule (300 mg total) by mouth 3 (three) times daily. What changed:  how much to take when to take this   HYDROcodone-acetaminophen 5-325 MG tablet Commonly known as: NORCO/VICODIN Take 1 tablet by mouth every 6 (six) hours as needed for moderate pain.   lamoTRIgine 100 MG tablet Commonly known as: LAMICTAL Take 100 mg by mouth 2 (two) times daily.   Misc. Devices Misc Right leg Prosthetic. Dx: Right AKA        Allergies:  Allergies  Allergen Reactions   Codeine Other (See Comments)  seizures   Zoloft [Sertraline] Hives and Rash    Family History: Family History  Problem Relation Age of Onset   Depression Mother    Bipolar disorder Mother    Varicose Veins Neg Hx     Social History:  reports that he has been smoking cigarettes. He has been smoking an average of .5 packs per day. He has never used smokeless tobacco. He reports that he does not currently use drugs after having used the following drugs: Cocaine and Methamphetamines. He reports that he does not drink alcohol.  ROS: Pertinent ROS in HPI  Physical Exam: There were no vitals taken for this visit.  Constitutional:  Well nourished. Alert and oriented, No acute distress. HEENT: Center AT, moist mucus membranes.  Trachea midline, no  masses. Cardiovascular: No clubbing, cyanosis, or edema. Respiratory: Normal respiratory effort, no increased work of breathing. GI: Abdomen is soft, non tender, non distended, no abdominal masses. Liver and spleen not palpable.  No hernias appreciated.  Stool sample for occult testing is not indicated.   GU: No CVA tenderness.  No bladder fullness or masses.  Patient with circumcised/uncircumcised phallus. ***Foreskin easily retracted***  Urethral meatus is patent.  No penile discharge. No penile lesions or rashes. Scrotum without lesions, cysts, rashes and/or edema.  Testicles are located scrotally bilaterally. No masses are appreciated in the testicles. Left and right epididymis are normal. Rectal: Patient with  normal sphincter tone. Anus and perineum without scarring or rashes. No rectal masses are appreciated. Prostate is approximately *** grams, *** nodules are appreciated. Seminal vesicles are normal. Skin: No rashes, bruises or suspicious lesions. Lymph: No cervical or inguinal adenopathy. Neurologic: Grossly intact, no focal deficits, moving all 4 extremities. Psychiatric: Normal mood and affect.  Laboratory Data: N/A   Pertinent Imaging: N/A  Assessment & Plan:  ***  1. Condyloma acuminata  -s/p laser and excision of lesions in 02/2021  No follow-ups on file.  These notes generated with voice recognition software. I apologize for typographical errors.  Michiel Cowboy, PA-C  [facility] [address]  [address] [place], [place] [address] [phone]

## 2021-04-18 ENCOUNTER — Encounter: Payer: Medicare Other | Admitting: Urology

## 2021-04-18 DIAGNOSIS — A630 Anogenital (venereal) warts: Secondary | ICD-10-CM

## 2021-04-23 ENCOUNTER — Ambulatory Visit: Payer: Medicare Other | Admitting: Physical Therapy

## 2021-04-23 ENCOUNTER — Telehealth: Payer: Self-pay | Admitting: Physical Therapy

## 2021-04-23 NOTE — Telephone Encounter (Signed)
Called pt because of his absence and did not reach him so VM left instructing him to call back to reschedule missed apt.

## 2021-04-25 ENCOUNTER — Ambulatory Visit: Payer: Medicare Other | Attending: Neurosurgery | Admitting: Physical Therapy

## 2021-04-25 ENCOUNTER — Telehealth: Payer: Self-pay | Admitting: Physical Therapy

## 2021-04-25 NOTE — Telephone Encounter (Signed)
Called pt and left VM to inform him that he has reached limit on No Show appointments and he will need to be removed from schedule. Instructed pt to call back and PT will discharge pt once pt has been informed about decision.

## 2021-04-30 ENCOUNTER — Ambulatory Visit: Payer: Medicare Other | Admitting: Physical Therapy

## 2021-05-02 ENCOUNTER — Encounter: Payer: Medicare Other | Admitting: Physical Therapy

## 2021-05-06 ENCOUNTER — Encounter: Payer: Medicare Other | Admitting: Physical Therapy

## 2021-05-08 ENCOUNTER — Encounter: Payer: Medicare Other | Admitting: Physical Therapy

## 2021-05-13 ENCOUNTER — Encounter: Payer: Medicare Other | Admitting: Physical Therapy

## 2021-05-22 ENCOUNTER — Encounter: Payer: Medicare Other | Admitting: Physical Therapy

## 2021-05-27 ENCOUNTER — Encounter: Payer: Medicare Other | Admitting: Physical Therapy

## 2021-05-30 ENCOUNTER — Encounter: Payer: Medicare Other | Admitting: Physical Therapy

## 2021-07-16 ENCOUNTER — Ambulatory Visit: Payer: Medicare Other | Admitting: Podiatry

## 2021-08-27 ENCOUNTER — Other Ambulatory Visit: Payer: Self-pay

## 2021-08-27 ENCOUNTER — Emergency Department
Admission: EM | Admit: 2021-08-27 | Discharge: 2021-08-27 | Disposition: A | Discharge status: Home / Self Care | Payer: Medicare Other | Attending: Emergency Medicine | Admitting: Emergency Medicine

## 2021-08-27 ENCOUNTER — Emergency Department: Payer: Medicare Other

## 2021-08-27 DIAGNOSIS — Z20822 Contact with and (suspected) exposure to covid-19: Secondary | ICD-10-CM | POA: Diagnosis not present

## 2021-08-27 DIAGNOSIS — J40 Bronchitis, not specified as acute or chronic: Secondary | ICD-10-CM | POA: Diagnosis not present

## 2021-08-27 DIAGNOSIS — R059 Cough, unspecified: Secondary | ICD-10-CM | POA: Diagnosis present

## 2021-08-27 DIAGNOSIS — I10 Essential (primary) hypertension: Secondary | ICD-10-CM | POA: Diagnosis not present

## 2021-08-27 LAB — CBC WITH DIFFERENTIAL/PLATELET
Abs Immature Granulocytes: 0.03 10*3/uL (ref 0.00–0.07)
Basophils Absolute: 0 10*3/uL (ref 0.0–0.1)
Basophils Relative: 0 %
Eosinophils Absolute: 0.4 10*3/uL (ref 0.0–0.5)
Eosinophils Relative: 4 %
HCT: 45 % (ref 39.0–52.0)
Hemoglobin: 15.2 g/dL (ref 13.0–17.0)
Immature Granulocytes: 0 %
Lymphocytes Relative: 23 %
Lymphs Abs: 2.4 10*3/uL (ref 0.7–4.0)
MCH: 30.1 pg (ref 26.0–34.0)
MCHC: 33.8 g/dL (ref 30.0–36.0)
MCV: 89.1 fL (ref 80.0–100.0)
Monocytes Absolute: 0.8 10*3/uL (ref 0.1–1.0)
Monocytes Relative: 7 %
Neutro Abs: 6.7 10*3/uL (ref 1.7–7.7)
Neutrophils Relative %: 66 %
Platelets: 347 10*3/uL (ref 150–400)
RBC: 5.05 MIL/uL (ref 4.22–5.81)
RDW: 11.5 % (ref 11.5–15.5)
WBC: 10.3 10*3/uL (ref 4.0–10.5)
nRBC: 0 % (ref 0.0–0.2)

## 2021-08-27 LAB — BASIC METABOLIC PANEL
Anion gap: 9 (ref 5–15)
BUN: 15 mg/dL (ref 6–20)
CO2: 25 mmol/L (ref 22–32)
Calcium: 9.1 mg/dL (ref 8.9–10.3)
Chloride: 103 mmol/L (ref 98–111)
Creatinine, Ser: 0.88 mg/dL (ref 0.61–1.24)
GFR, Estimated: >60 mL/min (ref >60)
Glucose, Bld: 116 mg/dL — ABNORMAL HIGH (ref 70–99)
Potassium: 4.1 mmol/L (ref 3.5–5.1)
Sodium: 137 mmol/L (ref 135–145)

## 2021-08-27 LAB — TROPONIN I (HIGH SENSITIVITY): Troponin I (High Sensitivity): 3 ng/L (ref <18)

## 2021-08-27 LAB — RESP PANEL BY RT-PCR (FLU A&B, COVID) ARPGX2
Influenza A by PCR: NEGATIVE
Influenza B by PCR: NEGATIVE
SARS Coronavirus 2 by RT PCR: NEGATIVE

## 2021-08-27 LAB — D-DIMER, QUANTITATIVE: D-Dimer, Quant: 0.4 ug/mL-FEU (ref 0.00–0.50)

## 2021-08-27 MED ORDER — IPRATROPIUM-ALBUTEROL 0.5-2.5 (3) MG/3ML IN SOLN
3.0000 mL | Freq: Once | RESPIRATORY_TRACT | Status: AC
  Administered 2021-08-27: 3 mL via RESPIRATORY_TRACT
  Filled 2021-08-27: qty 3

## 2021-08-27 MED ORDER — HYDROCORTISONE 1 % EX OINT: 1.0000 "application " | TOPICAL_OINTMENT | Freq: Two times a day (BID) | CUTANEOUS | 0 refills | Status: DC

## 2021-08-27 MED ORDER — AZITHROMYCIN 500 MG PO TABS
500.0000 mg | ORAL_TABLET | Freq: Once | ORAL | Status: AC
  Administered 2021-08-27: 500 mg via ORAL
  Filled 2021-08-27: qty 1

## 2021-08-27 MED ORDER — AZITHROMYCIN 250 MG PO TABS: ORAL_TABLET | ORAL | 0 refills | Status: DC

## 2021-08-27 MED ORDER — ALBUTEROL SULFATE HFA 108 (90 BASE) MCG/ACT IN AERS: 2.0000 | INHALATION_SPRAY | Freq: Four times a day (QID) | RESPIRATORY_TRACT | 2 refills | Status: DC | PRN

## 2021-08-27 MED ORDER — ADVAIR HFA 45-21 MCG/ACT IN AERO: 2.0000 | INHALATION_SPRAY | Freq: Two times a day (BID) | RESPIRATORY_TRACT | 12 refills | Status: DC

## 2021-08-27 MED ORDER — METHYLPREDNISOLONE SODIUM SUCC 125 MG IJ SOLR
125.0000 mg | Freq: Once | INTRAMUSCULAR | Status: AC
  Administered 2021-08-27: 125 mg via INTRAVENOUS
  Filled 2021-08-27: qty 2

## 2021-08-27 MED ORDER — ACETAMINOPHEN 500 MG PO TABS
1000.0000 mg | ORAL_TABLET | Freq: Once | ORAL | Status: AC
  Administered 2021-08-27: 1000 mg via ORAL
  Filled 2021-08-27: qty 2

## 2021-08-27 MED ORDER — PREDNISONE 20 MG PO TABS: 60.0000 mg | ORAL_TABLET | Freq: Every day | ORAL | 0 refills | Status: DC

## 2021-08-27 MED ORDER — PREDNISONE 20 MG PO TABS: 60.0000 mg | ORAL_TABLET | Freq: Every day | ORAL | 0 refills | Status: AC

## 2021-08-27 NOTE — ED Provider Notes (Signed)
[facility] Provider Note    Event Date/Time   First MD Initiated Contact with Patient 08/27/21 [phone]     (approximate)   History   Chest Pain and Cough   HPI  Joshua [name] is a 31 y.o. male with a history of polysubstance abuse complicated by cardiac arrest and cardiogenic shock in 2019, asthma, bronchitis, hypertension who presents for evaluation of chest pain, cough, shortness of breath and wheezing.  Patient reports that his symptoms have been getting progressively worse over the last 2 weeks.  He reports having an episode of bronchitis a month ago and was treated by his primary care doctor with some steroids and a Z-Pak.  He improved slightly but over the last 2 weeks have been getting progressively worse.  He reports over the last several days he has had severe chest tightness that radiates to his upper back, associated with shortness of breath, wheezing, cough productive of green phlegm and green nasal congestion.  No fever or chills.  He denies any prior history of PE or DVT, no recent travel immobilization, no leg pain or swelling, no hemoptysis or exogenous hormones.  He was a smoker for     Past Medical History:  Diagnosis Date   Acute renal failure (ARF) ([place])    History of    Aspiration pneumonia (HCC)    Hx of   Asthma    Bronchitis    Cardiac arrest ([place])    hx of   Cardiogenic shock (HCC)    hx of   Drug overdose    Hernia cerebri ([place])    History of acute respiratory failure    Hypertension    Neuropathy    Polysubstance abuse ([place])    Rhabdomyolysis    Seizures ([place])     Past Surgical History:  Procedure Laterality Date   AMPUTATION Right 11/03/2017   Procedure: KNEE DISARTICULATION;  Surgeon: Katha Cabal, MD;  Location: [facility];  Service: Vascular;  Laterality: Right;   AMPUTATION Right 11/10/2017   Procedure: AMPUTATION ABOVE KNEE;  Surgeon: Katha Cabal, MD;  Location: [facility];  Service: Vascular;  Laterality:  Right;   ankle/foot surgery with metal plates     CERVICAL SPINE SURGERY     CONDYLOMA EXCISION/FULGURATION N/A 03/19/2021   Procedure: CONDYLOMA REMOVAL WITH Co2 LASER;  Surgeon: Abbie Sons, MD;  Location: [facility];  Service: Urology;  Laterality: N/A;   DIALYSIS/PERMA CATHETER INSERTION N/A 11/16/2017   Procedure: DIALYSIS/PERMA CATHETER INSERTION;  Surgeon: Algernon Huxley, MD;  Location: [place] [facility];  Service: Cardiovascular;  Laterality: N/A;   DIALYSIS/PERMA CATHETER REMOVAL N/A 12/14/2017   Procedure: DIALYSIS/PERMA CATHETER REMOVAL;  Surgeon: Algernon Huxley, MD;  Location: [place] [facility];  Service: Cardiovascular;  Laterality: N/A;   permacath to right side     Scheduled to be removed     Physical Exam   Triage Vital Signs: ED Triage Vitals  Enc Vitals Group     BP 08/27/21 0144 (!) 143/93     Pulse Rate 08/27/21 0144 96     Resp 08/27/21 0144 18     Temp 08/27/21 0144 98.6 F (37 C)     Temp Source 08/27/21 0144 Oral     SpO2 08/27/21 0144 96 %     Weight 08/27/21 0145 170 lb (77.1 kg)     Height 08/27/21 0145 5\' 10"  (1.778 m)     Head Circumference --  Peak Flow --      Pain Score 08/27/21 0145 9     Pain Loc --      Pain Edu? --      Excl. in [place]? --     Most recent vital signs: Vitals:   08/27/21 0144 08/27/21 0401  BP: (!) 143/93   Pulse: 96 90  Resp: 18 18  Temp: 98.6 F (37 C)   SpO2: 96% 100%     Constitutional: Alert and oriented. Well appearing and in no apparent distress. HEENT:      Head: Normocephalic and atraumatic.         Eyes: Conjunctivae are normal. Sclera is non-icteric.       Mouth/Throat: Mucous membranes are moist.       Neck: Supple with no signs of meningismus. Cardiovascular: Regular rate and rhythm. No murmurs, gallops, or rubs. 2+ symmetrical distal pulses are present in all extremities.  Respiratory: Normal respiratory effort.  Wheezing bilaterally Gastrointestinal: Soft, non tender, and non distended with  positive bowel sounds. No rebound or guarding. Genitourinary: No CVA tenderness. Musculoskeletal:  No edema, cyanosis, or erythema of extremities. Neurologic: Normal speech and language. Face is symmetric. Moving all extremities. No gross focal neurologic deficits are appreciated. Skin: Skin is warm, dry and intact. No rash noted. Psychiatric: Mood and affect are normal. Speech and behavior are normal.  ED Results / Procedures / Treatments   Labs (all labs ordered are listed, but only abnormal results are displayed) Labs Reviewed  BASIC METABOLIC PANEL - Abnormal; Notable for the following components:      Result Value   Glucose, Bld 116 (*)    All other components within normal limits  RESP PANEL BY RT-PCR (FLU A&B, COVID) ARPGX2  CBC WITH DIFFERENTIAL/PLATELET  D-DIMER, QUANTITATIVE  TROPONIN I (HIGH SENSITIVITY)     EKG  ED ECG REPORT I, Rudene Re, the attending physician, personally viewed and interpreted this ECG.  Normal sinus rhythm with a rate of 86, normal intervals, normal axis, no ST elevations or depressions.   RADIOLOGY I have personally reviewed the images performed during this visit and I agree with the Radiologist's read.   Interpretation by Radiologist:  DG Chest 2 View  Result Date: 08/27/2021 CLINICAL DATA:  Initial evaluation for acute cough. EXAM: CHEST - 2 VIEW COMPARISON:  Prior radiograph from 10/25/2020. FINDINGS: The cardiac and mediastinal silhouettes are stable in size and contour, and remain within normal limits. The lungs are normally inflated. Subtle diffuse peribronchial thickening, suggesting possible bronchiolitis given history of cough. No focal infiltrates or consolidative disease to suggest bronchopneumonia. No edema or effusion. No pneumothorax. No acute osseous finding. No acute osseous abnormality. IMPRESSION: Subtle diffuse peribronchial thickening, suggesting bronchiolitis given history of cough. No focal infiltrates or  consolidative disease to suggest bronchopneumonia. Electronically Signed   By: Jeannine Boga M.D.   On: 08/27/2021 02:29      PROCEDURES:  Critical Care performed: No  Procedures    IMPRESSION / MDM / ASSESSMENT AND PLAN / ED COURSE  I reviewed the triage vital signs and the nursing notes.  31 y.o. male with a history of smoking, bronchitis, polysubstance abuse complicated by cardiac arrest and cardiogenic shock in 2019, asthma, hypertension who presents for evaluation of chest pain, cough, shortness of breath and wheezing x 2 weeks.  Complaining of tight chest pain radiating to the upper back and shortness of breath in the setting of productive cough and congestion.  He is otherwise well-appearing in  no distress with normal vital signs, normal work of breathing normal sats, he does have some faint wheezing bilaterally  Ddx: Bronchitis versus pneumonia versus COVID versus flu versus asthma exacerbation versus pericarditis versus myocarditis versus PE   Plan: Chest x-ray, COVID and flu swab, EKG, CBC, BMP, troponin, D-dimer.  DuoNeb x2, Solu-Medrol, azithromycin, Tylenol for chest pain.  Patient placed on telemetry for close monitoring of cardiorespiratory status.   MEDICATIONS GIVEN IN ED: Medications  ipratropium-albuterol (DUONEB) 0.5-2.5 (3) MG/3ML nebulizer solution 3 mL (3 mLs Nebulization Given 08/27/21 0430)  ipratropium-albuterol (DUONEB) 0.5-2.5 (3) MG/3ML nebulizer solution 3 mL (3 mLs Nebulization Given 08/27/21 0430)  methylPREDNISolone sodium succinate (SOLU-MEDROL) 125 mg/2 mL injection 125 mg (125 mg Intravenous Given 08/27/21 0430)  azithromycin (ZITHROMAX) tablet 500 mg (500 mg Oral Given 08/27/21 0429)  acetaminophen (TYLENOL) tablet 1,000 mg (1,000 mg Oral Given 08/27/21 0429)     ED COURSE: COVID and flu swab negative.  Chest x-ray visualized by me consistent with a viral pattern, confirmed by radiology.  EKG and troponin with no signs of ischemia, pericarditis or  myocarditis.  No leukocytosis, no anemia. D-dimer negative, BMP with no acute abnormalities other than mild hyperglycemia no evidence of DKA. Patient reassessed after treatments and feels markedly improved.  Remains with normal work of breathing normal sats, no longer having any wheezing. With a negative work-up, no respiratory distress, no oxygen requirement, and improvement in symptoms with treatment patient does not meet criteria for admission at this time.  Will discharge home on prednisone, butyryl and Z-Pak.  Recommended follow-up with PCP.  Discussed my standard return precautions for worsening shortness of breath, chest pain or fever.   Consults: none  EMR reviewed I reviewed the note from his visit to his primary care doctor 2 months ago for similar symptoms.  Also reviewed patient's chart from his prolonged hospitalization 2019 after patient sustained cardiac arrest from polysubstance abuse          FINAL CLINICAL IMPRESSION(S) / ED DIAGNOSES   Final diagnoses:  Bronchitis     Rx / DC Orders   ED Discharge Orders          Ordered    predniSONE (DELTASONE) 20 MG tablet  Daily with breakfast        08/27/21 0516    azithromycin (ZITHROMAX) 250 MG tablet        08/27/21 0516    albuterol (VENTOLIN HFA) 108 (90 Base) MCG/ACT inhaler  Every 6 hours PRN        08/27/21 0516             Note:  This document was prepared using Dragon voice recognition software and may include unintentional dictation errors.    Alfred Levins, [place], MD 08/27/21 [phone]

## 2021-08-27 NOTE — ED Triage Notes (Signed)
Pt in with co cough and congestion for 3 weeks, treated by PMD with zithromax with no relief.

## 2021-08-27 NOTE — ED Notes (Signed)
Patient discharged to home per MD order. Patient in stable condition, and deemed medically cleared by ED provider for discharge. Discharge instructions reviewed with patient/family using "Teach Back"; verbalized understanding of medication education and administration, and information about follow-up care. Denies further concerns. ° °

## 2021-08-27 NOTE — ED Triage Notes (Signed)
See paper chart for downtime 

## 2021-08-27 NOTE — ED Notes (Signed)
ED Provider at bedside. 

## 2021-10-20 ENCOUNTER — Emergency Department
Admission: EM | Admit: 2021-10-20 | Discharge: 2021-10-20 | Disposition: A | Discharge status: Home / Self Care | Payer: Medicare Other | Attending: Emergency Medicine | Admitting: Emergency Medicine

## 2021-10-20 ENCOUNTER — Other Ambulatory Visit: Payer: Self-pay

## 2021-10-20 ENCOUNTER — Emergency Department: Payer: Medicare Other

## 2021-10-20 DIAGNOSIS — R7309 Other abnormal glucose: Secondary | ICD-10-CM | POA: Insufficient documentation

## 2021-10-20 DIAGNOSIS — L249 Irritant contact dermatitis, unspecified cause: Secondary | ICD-10-CM | POA: Insufficient documentation

## 2021-10-20 DIAGNOSIS — R21 Rash and other nonspecific skin eruption: Secondary | ICD-10-CM | POA: Diagnosis present

## 2021-10-20 LAB — CBG MONITORING, ED: Glucose-Capillary: 106 mg/dL — ABNORMAL HIGH (ref 70–99)

## 2021-10-20 LAB — COMPREHENSIVE METABOLIC PANEL
ALT: 13 U/L (ref 0–44)
AST: 16 U/L (ref 15–41)
Albumin: 4.5 g/dL (ref 3.5–5.0)
Alkaline Phosphatase: 60 U/L (ref 38–126)
Anion gap: 8 (ref 5–15)
BUN: 16 mg/dL (ref 6–20)
CO2: 27 mmol/L (ref 22–32)
Calcium: 9.1 mg/dL (ref 8.9–10.3)
Chloride: 100 mmol/L (ref 98–111)
Creatinine, Ser: 1.09 mg/dL (ref 0.61–1.24)
GFR, Estimated: >60 mL/min (ref >60)
Glucose, Bld: 107 mg/dL — ABNORMAL HIGH (ref 70–99)
Potassium: 3.8 mmol/L (ref 3.5–5.1)
Sodium: 135 mmol/L (ref 135–145)
Total Bilirubin: 0.5 mg/dL (ref 0.3–1.2)
Total Protein: 7.4 g/dL (ref 6.5–8.1)

## 2021-10-20 LAB — CBC WITH DIFFERENTIAL/PLATELET
Abs Immature Granulocytes: 0.04 10*3/uL (ref 0.00–0.07)
Basophils Absolute: 0.1 10*3/uL (ref 0.0–0.1)
Basophils Relative: 1 %
Eosinophils Absolute: 0.4 10*3/uL (ref 0.0–0.5)
Eosinophils Relative: 3 %
HCT: 49.8 % (ref 39.0–52.0)
Hemoglobin: 16.7 g/dL (ref 13.0–17.0)
Immature Granulocytes: 0 %
Lymphocytes Relative: 29 %
Lymphs Abs: 3.3 10*3/uL (ref 0.7–4.0)
MCH: 29.4 pg (ref 26.0–34.0)
MCHC: 33.5 g/dL (ref 30.0–36.0)
MCV: 87.7 fL (ref 80.0–100.0)
Monocytes Absolute: 1.1 10*3/uL — ABNORMAL HIGH (ref 0.1–1.0)
Monocytes Relative: 10 %
Neutro Abs: 6.7 10*3/uL (ref 1.7–7.7)
Neutrophils Relative %: 57 %
Platelets: 438 10*3/uL — ABNORMAL HIGH (ref 150–400)
RBC: 5.68 MIL/uL (ref 4.22–5.81)
RDW: 12.1 % (ref 11.5–15.5)
WBC: 11.6 10*3/uL — ABNORMAL HIGH (ref 4.0–10.5)
nRBC: 0 % (ref 0.0–0.2)

## 2021-10-20 LAB — TROPONIN I (HIGH SENSITIVITY): Troponin I (High Sensitivity): <2 ng/L (ref <18)

## 2021-10-20 LAB — LACTIC ACID, PLASMA: Lactic Acid, Venous: 1 mmol/L (ref 0.5–1.9)

## 2021-10-20 MED ORDER — DEXAMETHASONE 6 MG PO TABS
10.0000 mg | ORAL_TABLET | Freq: Once | ORAL | Status: AC
  Administered 2021-10-20: 10 mg via ORAL
  Filled 2021-10-20: qty 1

## 2021-10-20 MED ORDER — DOXYCYCLINE HYCLATE 100 MG PO TABS
100.0000 mg | ORAL_TABLET | Freq: Once | ORAL | Status: AC
  Administered 2021-10-20: 100 mg via ORAL
  Filled 2021-10-20: qty 1

## 2021-10-20 MED ORDER — DOXYCYCLINE HYCLATE 100 MG PO CAPS: 100.0000 mg | ORAL_CAPSULE | Freq: Two times a day (BID) | ORAL | 0 refills | Status: AC

## 2021-10-20 MED ORDER — NYSTATIN-TRIAMCINOLONE 100000-0.1 UNIT/GM-% EX CREA
TOPICAL_CREAM | Freq: Two times a day (BID) | CUTANEOUS | Status: DC
  Filled 2021-10-20: qty 15

## 2021-10-20 MED ORDER — NYSTATIN-TRIAMCINOLONE 100000-0.1 UNIT/GM-% EX OINT: 1.0000 "application " | TOPICAL_OINTMENT | Freq: Two times a day (BID) | CUTANEOUS | 0 refills | Status: AC

## 2021-10-20 NOTE — ED Notes (Signed)
Red, lav, lt green, blue, grey, 1 set blood cultures sent to lab at this time.

## 2021-10-20 NOTE — ED Notes (Signed)
Pt was found to be at the nurses station requesting to leave due to Benedetto Goad being outside, pt educated that we still needed to give him his medications and this RN was waiting for them to come from pharmacy. Pt refused to go back to bed to give medications and so this RN could scan them. Pt was given oral decadron and oral doxy and was sent home with topical cream and instructions how and where to apply.   D/C, new RX, and reasons to return to ED discussed with pt, pt verbalized understanding. Pt also refused D/C vitals. Pt ambulatory with steady gait on D/C.

## 2021-10-20 NOTE — ED Triage Notes (Signed)
Pt reports increasing weakness/fatigue x several days. Pt reports feeling like an infection has gotten into his blood stream and is in his "heart area". Pt states has not been sleeping well. Pt states has had some urinary incontinence. Pt states hx of R leg amputation and feels like it is spreading to his L leg and throughout his body. Pt states has recently been exposed to a pregnant woman with a blood stream infection and a friend with an infection in his leg and feels like this is pertinent information.   Pt reports feeling he is confused, able to answer all questions appropriately without difficulty. Pt acknowledges that he is able to answer questions but just "feels confused".
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
## 2021-10-20 NOTE — ED Notes (Addendum)
See triage note  Pt concerned for infection to R amputated leg infection, pt states "it has been going on for a month". Pt denies fevers or chills. Pt states he has been exposed to cellulitis and a pregnant women who has recently had positive blood cultures. Pt asked to placed gown on so this RN and provider can assess infection concern at this time. Pt is A&Ox4 at this time. NAD noted. VSS.    Pt removed his prosthetic and pt does have some contact dermatis appearing rash to stump and upper thigh, pt states it itches and burns.

## 2022-01-09 ENCOUNTER — Other Ambulatory Visit: Payer: Self-pay

## 2022-01-09 ENCOUNTER — Emergency Department
Admission: EM | Admit: 2022-01-09 | Discharge: 2022-01-09 | Disposition: A | Discharge status: Home / Self Care | Payer: No Typology Code available for payment source | Attending: Emergency Medicine | Admitting: Emergency Medicine

## 2022-01-09 ENCOUNTER — Emergency Department: Payer: No Typology Code available for payment source

## 2022-01-09 DIAGNOSIS — S161XXA Strain of muscle, fascia and tendon at neck level, initial encounter: Secondary | ICD-10-CM | POA: Insufficient documentation

## 2022-01-09 DIAGNOSIS — S39012A Strain of muscle, fascia and tendon of lower back, initial encounter: Secondary | ICD-10-CM | POA: Diagnosis not present

## 2022-01-09 DIAGNOSIS — S0990XA Unspecified injury of head, initial encounter: Secondary | ICD-10-CM | POA: Insufficient documentation

## 2022-01-09 DIAGNOSIS — Y92410 Unspecified street and highway as the place of occurrence of the external cause: Secondary | ICD-10-CM | POA: Diagnosis not present

## 2022-01-09 DIAGNOSIS — S199XXA Unspecified injury of neck, initial encounter: Secondary | ICD-10-CM | POA: Diagnosis present

## 2022-01-09 MED ORDER — MELOXICAM 15 MG PO TABS: 15.0000 mg | ORAL_TABLET | Freq: Every day | ORAL | 0 refills | Status: AC

## 2022-01-09 MED ORDER — HYDROCODONE-ACETAMINOPHEN 5-325 MG PO TABS
1.0000 | ORAL_TABLET | Freq: Once | ORAL | Status: AC
  Administered 2022-01-09: 1 via ORAL
  Filled 2022-01-09: qty 1

## 2022-01-09 MED ORDER — METHOCARBAMOL 500 MG PO TABS: 500.0000 mg | ORAL_TABLET | Freq: Four times a day (QID) | ORAL | 0 refills | Status: DC

## 2022-01-09 MED ORDER — HYDROCODONE-ACETAMINOPHEN 5-325 MG PO TABS: 1.0000 | ORAL_TABLET | ORAL | 0 refills | Status: DC | PRN

## 2022-01-09 MED ORDER — MELOXICAM 7.5 MG PO TABS
15.0000 mg | ORAL_TABLET | Freq: Once | ORAL | Status: AC
  Administered 2022-01-09: 15 mg via ORAL
  Filled 2022-01-09: qty 2

## 2022-01-09 MED ORDER — METHOCARBAMOL 500 MG PO TABS
1000.0000 mg | ORAL_TABLET | Freq: Once | ORAL | Status: AC
  Administered 2022-01-09: 1000 mg via ORAL
  Filled 2022-01-09: qty 2
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
## 2022-06-17 ENCOUNTER — Emergency Department: Payer: Medicare Other

## 2022-06-17 ENCOUNTER — Encounter: Payer: Self-pay | Admitting: Emergency Medicine

## 2022-06-17 ENCOUNTER — Other Ambulatory Visit: Payer: Self-pay

## 2022-06-17 ENCOUNTER — Emergency Department
Admission: EM | Admit: 2022-06-17 | Discharge: 2022-06-17 | Disposition: A | Discharge status: Home / Self Care | Payer: Medicare Other | Attending: Emergency Medicine | Admitting: Emergency Medicine

## 2022-06-17 DIAGNOSIS — M542 Cervicalgia: Secondary | ICD-10-CM | POA: Insufficient documentation

## 2022-06-17 DIAGNOSIS — S0990XA Unspecified injury of head, initial encounter: Secondary | ICD-10-CM | POA: Insufficient documentation

## 2022-06-17 DIAGNOSIS — M549 Dorsalgia, unspecified: Secondary | ICD-10-CM | POA: Diagnosis not present

## 2022-06-17 DIAGNOSIS — M795 Residual foreign body in soft tissue: Secondary | ICD-10-CM

## 2022-06-17 DIAGNOSIS — S99922A Unspecified injury of left foot, initial encounter: Secondary | ICD-10-CM | POA: Diagnosis not present

## 2022-06-17 DIAGNOSIS — S6991XA Unspecified injury of right wrist, hand and finger(s), initial encounter: Secondary | ICD-10-CM | POA: Diagnosis present

## 2022-06-17 DIAGNOSIS — M7918 Myalgia, other site: Secondary | ICD-10-CM

## 2022-06-17 DIAGNOSIS — S61421A Laceration with foreign body of right hand, initial encounter: Secondary | ICD-10-CM | POA: Insufficient documentation

## 2022-06-17 DIAGNOSIS — W19XXXA Unspecified fall, initial encounter: Secondary | ICD-10-CM | POA: Insufficient documentation

## 2022-06-17 MED ORDER — OXYCODONE-ACETAMINOPHEN 5-325 MG PO TABS: 1.0000 | ORAL_TABLET | ORAL | 0 refills | Status: AC | PRN

## 2022-06-17 MED ORDER — LIDOCAINE HCL (PF) 1 % IJ SOLN
5.0000 mL | Freq: Once | INTRAMUSCULAR | Status: AC
  Administered 2022-06-17: 5 mL via INTRADERMAL
  Filled 2022-06-17: qty 5

## 2022-06-17 MED ORDER — ACETAMINOPHEN 500 MG PO TABS
1000.0000 mg | ORAL_TABLET | Freq: Once | ORAL | Status: AC
  Administered 2022-06-17: 1000 mg via ORAL
  Filled 2022-06-17: qty 2

## 2022-06-17 MED ORDER — OXYCODONE HCL 5 MG PO TABS
5.0000 mg | ORAL_TABLET | Freq: Once | ORAL | Status: AC
  Administered 2022-06-17: 5 mg via ORAL
  Filled 2022-06-17: qty 1

## 2022-06-17 MED ORDER — CYCLOBENZAPRINE HCL 10 MG PO TABS: 10.0000 mg | ORAL_TABLET | Freq: Three times a day (TID) | ORAL | 0 refills | Status: DC | PRN

## 2022-06-17 NOTE — ED Provider Notes (Signed)
[facility] Provider Note    Event Date/Time   First MD Initiated Contact with Patient 06/17/22 1739     (approximate)   History   Fall   HPI  Joshua [name] is a 31 y.o. male with history of right above-knee amputation and remaining history as listed in the EMR presents to the emergency department after mechanical, nonsyncopal fall prior to arrival.  Patient states that his artificial leg is not bending when it should.  This caused him to fall forward and on concrete floor.  He had a glass bottle in his hand when he fell and now has a laceration and feels like he has glass in his right hand.  He is also having headache, neck pain, generalized back pain, and left foot pain.  He denies loss of consciousness.     Physical Exam   Triage Vital Signs: ED Triage Vitals  Enc Vitals Group     BP 06/17/22 1720 (!) 146/86     Pulse Rate 06/17/22 1720 (!) 104     Resp 06/17/22 1720 20     Temp 06/17/22 1720 98.6 F (37 C)     Temp Source 06/17/22 1720 Oral     SpO2 06/17/22 1720 100 %     Weight 06/17/22 1719 175 lb (79.4 kg)     Height 06/17/22 1719 5\' 10"  (1.778 m)     Head Circumference --      Peak Flow --      Pain Score 06/17/22 1719 8     Pain Loc --      Pain Edu? --      Excl. in [place]? --     Most recent vital signs: Vitals:   06/17/22 1720 06/17/22 2025  BP: (!) 146/86 134/87  Pulse: (!) 104 87  Resp: 20 20  Temp: 98.6 F (37 C)   SpO2: 100% 95%     General: Awake, no distress. CV:  Good peripheral perfusion.  Resp:  Normal effort.  Abd:  No distention.  Other:  No deformity of the left foot. Motor and sensory function is intact.    No focal midline tendern   ED Results / Procedures / Treatments   Labs (all labs ordered are listed, but only abnormal results are displayed) Labs Reviewed - No data to display   EKG  Not indicated   RADIOLOGY  CT of the head and cervical spine are both negative for acute concerns.  Image of  the left foot reviewed and interpreted by me.  No acute findings.  Radiology report consistent with the same.  Images of the right hand viewed and interpreted by me.  Retained foreign body within the volar aspect of the base of the hand.  Radiology report consistent with the same.  PROCEDURES:  Critical Care performed: No  Procedures  Several attempts made to remove glass from palm of the right hand without success.  1% lidocaine was used to anesthetize the area.  Ultrasound used to attempt to locate foreign body.  Unable to safely retrieve the small piece of glass.  Bleeding well controlled and is approximately half centimeter.  No sutures required.  Antibiotic ointment and sterile dressing applied  MEDICATIONS ORDERED IN ED: Medications  lidocaine (PF) (XYLOCAINE) 1 % injection 5 mL (5 mLs Intradermal Given 06/17/22 1822)  acetaminophen (TYLENOL) tablet 1,000 mg (1,000 mg Oral Given 06/17/22 1822)  oxyCODONE (Oxy IR/ROXICODONE) immediate release tablet 5 mg (5 mg Oral Given 06/17/22 1821)  oxyCODONE (Oxy IR/ROXICODONE) immediate release tablet 5 mg (5 mg Oral Given 06/17/22 2025)     IMPRESSION / MDM / ASSESSMENT AND PLAN / ED COURSE  I reviewed the triage vital signs and the nursing notes.                              Differential diagnosis includes, but is not limited to head injury, cervical vertebral injury, foot fracture, retained foreign body, musculoskeletal pain.  Patient's presentation is most consistent with acute presentation with potential threat to life or bodily function.  31 year old male presenting to the emergency department after mechanical, nonsyncopal fall prior to arrival.  See HPI for further details.  Exam is reassuring with the exception of a laceration to the right hand.  Patient can feel glass but they were unable to find it to remove it before arrival.  CT of the head, cervical spine are both negative for acute concerns.  Image of the left foot is  negative for acute bony abnormality.  Image of the right hand confirms retained radiopaque foreign body.  Patient with a history of substance abuse but states that he needs something to help with pain.  He states that he has been clean for a long time and has required pain medication in the past and has not triggered addictive behaviors.   Patient to be discharged home and encouraged to follow-up with [facility] that performed his foot surgery.  He will also follow-up with the company that has programed his prosthesis. ER return precautions given.  FINAL CLINICAL IMPRESSION(S) / ED DIAGNOSES   Final diagnoses:  Retained foreign body of hand  Minor head injury, initial encounter  Foot injury, left, initial encounter  Musculoskeletal pain     Rx / DC Orders   ED Discharge Orders          Ordered    oxyCODONE-acetaminophen (PERCOCET) 5-325 MG tablet  Every 4 hours PRN        06/17/22 2019    cyclobenzaprine (FLEXERIL) 10 MG tablet  3 times daily PRN        06/17/22 2019             Note:  This document was prepared using Dragon voice recognition software and may include unintentional dictation errors.   Chinita Pester, FNP 06/17/22 2311    Merwyn Katos, MD 06/17/22 [phone]

## 2022-06-17 NOTE — ED Provider Notes (Incomplete)
[facility] Provider Note    Event Date/Time   First MD Initiated Contact with Patient 06/17/22 1739     (approximate)   History   Fall   HPI  Joshua [name] is a 31 y.o. male with history of right above-knee amputation and remaining history as listed in the EMR presents to the emergency department after mechanical, nonsyncopal fall prior to arrival.  Patient states that his artificial leg is not bending when it should.  This caused him to fall forward and on concrete floor.  He had a glass bottle in his hand when he fell and now has a laceration and feels like he has glass in his right hand.  He is also having headache, neck pain, generalized back pain, and left foot pain.  He denies loss of consciousness.     Physical Exam   Triage Vital Signs: ED Triage Vitals  Enc Vitals Group     BP 06/17/22 1720 (!) 146/86     Pulse Rate 06/17/22 1720 (!) 104     Resp 06/17/22 1720 20     Temp 06/17/22 1720 98.6 F (37 C)     Temp Source 06/17/22 1720 Oral     SpO2 06/17/22 1720 100 %     Weight 06/17/22 1719 175 lb (79.4 kg)     Height 06/17/22 1719 5\' 10"  (1.778 m)     Head Circumference --      Peak Flow --      Pain Score 06/17/22 1719 8     Pain Loc --      Pain Edu? --      Excl. in GC? --     Most recent vital signs: Vitals:   06/17/22 1720 06/17/22 2025  BP: (!) 146/86 134/87  Pulse: (!) 104 87  Resp: 20 20  Temp: 98.6 F (37 C)   SpO2: 100% 95%     General: Awake, no distress. CV:  Good peripheral perfusion.  Resp:  Normal effort.  Abd:  No distention.  Other:  No deformity of the left foot. Motor and sensory function is intact.    No focal midline tendern   ED Results / Procedures / Treatments   Labs (all labs ordered are listed, but only abnormal results are displayed) Labs Reviewed - No data to display   EKG  ***   RADIOLOGY *** {USE THE WORD "INTERPRETED"!! You MUST document your own interpretation of imaging, as well  as the fact that you reviewed the radiologist's report!:1}   PROCEDURES:  Critical Care performed: {CriticalCareYesNo:19197::"Yes, see critical care procedure note(s)","No"}  Procedures   MEDICATIONS ORDERED IN ED: Medications  lidocaine (PF) (XYLOCAINE) 1 % injection 5 mL (5 mLs Intradermal Given 06/17/22 1822)  acetaminophen (TYLENOL) tablet 1,000 mg (1,000 mg Oral Given 06/17/22 1822)  oxyCODONE (Oxy IR/ROXICODONE) immediate release tablet 5 mg (5 mg Oral Given 06/17/22 1821)  oxyCODONE (Oxy IR/ROXICODONE) immediate release tablet 5 mg (5 mg Oral Given 06/17/22 2025)     IMPRESSION / MDM / ASSESSMENT AND PLAN / ED COURSE  I reviewed the triage vital signs and the nursing notes.                              Differential diagnosis includes, but is not limited to, ***  Patient's presentation is most consistent with {EM COPA:27473}  {If the patient is on the monitor, remove the brackets and asterisks on  the sentence below and remember to document it as a Procedure as well. Otherwise delete the sentence below:1} {**The patient is on the cardiac monitor to evaluate for evidence of arrhythmia and/or significant heart rate changes.**} {Remember to include, when applicable, any/all of the following data: independent review of imaging independent review of labs (comment specifically on pertinent positives and negatives) review of specific prior hospitalizations, PCP/specialist notes, etc. discuss meds given and prescribed document any discussion with consultants (including hospitalists) any clinical decision tools you used and why (PECARN, NEXUS, etc.) did you consider admitting the patient? document social determinants of health affecting patient's care (homelessness, inability to follow up in a timely fashion, etc) document any pre-existing conditions increasing risk on current visit (e.g. diabetes and HTN increasing danger of high-risk chest pain/ACS) describes what meds you gave  (especially parenteral) and why any other interventions?:1}     FINAL CLINICAL IMPRESSION(S) / ED DIAGNOSES   Final diagnoses:  Retained foreign body of hand  Minor head injury, initial encounter  Foot injury, left, initial encounter  Musculoskeletal pain     Rx / DC Orders   ED Discharge Orders          Ordered    oxyCODONE-acetaminophen (PERCOCET) 5-325 MG tablet  Every 4 hours PRN        06/17/22 2019    cyclobenzaprine (FLEXERIL) 10 MG tablet  3 times daily PRN        06/17/22 2019             Note:  This document was prepared using Dragon voice recognition software and may include unintentional dictation errors.

## 2022-06-17 NOTE — ED Triage Notes (Signed)
Pt was in [facility] line states that his prosthetic R leg did not been causing him to fall. Pt c/o L foot and to pain. Pt also had a glass bottle in his hand that broke when he fell causing him to have a lac to his R palm. Pt is A&OX4 and NAD

## 2022-06-19 ENCOUNTER — Ambulatory Visit: Payer: Medicare Other | Admitting: Podiatry

## 2022-06-21 ENCOUNTER — Ambulatory Visit: Payer: Medicare Other | Admitting: Podiatry

## 2022-07-02 ENCOUNTER — Ambulatory Visit: Payer: Medicare Other | Admitting: Podiatry

## 2022-09-18 ENCOUNTER — Other Ambulatory Visit: Payer: Self-pay | Admitting: Neurosurgery

## 2022-09-18 DIAGNOSIS — M519 Unspecified thoracic, thoracolumbar and lumbosacral intervertebral disc disorder: Secondary | ICD-10-CM

## 2022-09-25 ENCOUNTER — Ambulatory Visit
Admission: RE | Admit: 2022-09-25 | Discharge: 2022-09-25 | Disposition: A | Discharge status: Home / Self Care | Payer: 59 | Source: Ambulatory Visit | Attending: Neurosurgery | Admitting: Neurosurgery

## 2022-09-25 DIAGNOSIS — M519 Unspecified thoracic, thoracolumbar and lumbosacral intervertebral disc disorder: Secondary | ICD-10-CM

## 2022-12-23 ENCOUNTER — Other Ambulatory Visit: Payer: Self-pay | Admitting: Orthopedic Surgery

## 2022-12-23 DIAGNOSIS — M5412 Radiculopathy, cervical region: Secondary | ICD-10-CM

## 2022-12-27 ENCOUNTER — Ambulatory Visit: Admission: RE | Admit: 2022-12-27 | Payer: 59 | Source: Ambulatory Visit

## 2022-12-27 ENCOUNTER — Ambulatory Visit
Admission: RE | Admit: 2022-12-27 | Discharge: 2022-12-27 | Disposition: A | Discharge status: Home / Self Care | Payer: 59 | Source: Ambulatory Visit | Attending: Orthopedic Surgery | Admitting: Orthopedic Surgery

## 2022-12-27 DIAGNOSIS — M5412 Radiculopathy, cervical region: Secondary | ICD-10-CM | POA: Diagnosis present

## 2023-02-13 ENCOUNTER — Emergency Department
Admission: EM | Admit: 2023-02-13 | Discharge: 2023-02-13 | Discharge status: Against Medical Advice | Payer: 59 | Source: Home / Self Care

## 2023-02-13 ENCOUNTER — Other Ambulatory Visit: Payer: Self-pay

## 2023-02-13 ENCOUNTER — Emergency Department
Admission: EM | Admit: 2023-02-13 | Discharge: 2023-02-13 | Discharge status: Against Medical Advice | Payer: 59 | Attending: Emergency Medicine | Admitting: Emergency Medicine

## 2023-02-13 DIAGNOSIS — Z5321 Procedure and treatment not carried out due to patient leaving prior to being seen by health care provider: Secondary | ICD-10-CM | POA: Insufficient documentation

## 2023-02-13 DIAGNOSIS — W19XXXA Unspecified fall, initial encounter: Secondary | ICD-10-CM | POA: Insufficient documentation

## 2023-02-13 DIAGNOSIS — R258 Other abnormal involuntary movements: Secondary | ICD-10-CM | POA: Diagnosis not present

## 2023-02-13 DIAGNOSIS — M79604 Pain in right leg: Secondary | ICD-10-CM | POA: Insufficient documentation

## 2023-02-13 DIAGNOSIS — M542 Cervicalgia: Secondary | ICD-10-CM | POA: Insufficient documentation

## 2023-02-13 NOTE — ED Triage Notes (Signed)
Pt presents to ER with c/o right leg pain, and some shaking in his hands that pt states has been ongoing for around a month, but states has been getting worse.  Pt states he has been having pain from his neck all down his spine, and states his right leg has had a numb and tingling sensation. Pt has hx of cervical radiculopathy and lumbar disc disease.  Pt ambulatory to triage and is otherwise A&O x4 and in NAD.

## 2023-02-13 NOTE — ED Notes (Signed)
Patient walked out to the parking lot, stating, "I need to check to see if my keys are in there."

## 2023-02-13 NOTE — ED Triage Notes (Addendum)
Pt sts that he fell this AM and eloped from triage due to the wait. Pt sts that he arrived from [facility] and sts that he has a lot of pain in his neck. Pt is a poor historian. Pt is having flights of ideas related to his complaint.

## 2023-03-09 ENCOUNTER — Ambulatory Visit: Payer: 59 | Admitting: Nurse Practitioner

## 2023-03-12 ENCOUNTER — Ambulatory Visit: Payer: 59 | Admitting: Nurse Practitioner

## 2023-03-17 ENCOUNTER — Encounter: Payer: Self-pay | Admitting: Nurse Practitioner

## 2023-03-17 ENCOUNTER — Telehealth: Payer: Self-pay

## 2023-03-17 ENCOUNTER — Ambulatory Visit (INDEPENDENT_AMBULATORY_CARE_PROVIDER_SITE_OTHER): Payer: 59 | Admitting: Nurse Practitioner

## 2023-03-17 VITALS — BP 130/74 | HR 96 | Temp 99.0°F | Ht 70.0 in | Wt 174.0 lb

## 2023-03-17 DIAGNOSIS — G8929 Other chronic pain: Secondary | ICD-10-CM | POA: Diagnosis not present

## 2023-03-17 DIAGNOSIS — M5440 Lumbago with sciatica, unspecified side: Secondary | ICD-10-CM

## 2023-03-17 NOTE — Telephone Encounter (Signed)
Called patient to inform them that he does not need to be seen until his [facility] appointment in 9/17.   Melina Copa, CMA
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
## 2023-03-17 NOTE — Telephone Encounter (Signed)
Called patient to inform that there is no follow up until his next [facility] appointment on 9/17.   Melina Copa, CMA

## 2023-03-17 NOTE — Patient Instructions (Signed)
Nice to see you today I have referred you to interventional pain management  Keep your appt with me as scheduled

## 2023-03-17 NOTE — Assessment & Plan Note (Signed)
Chronic longstanding issue.  Patient recently had a lumbar MRI through neurosurgery at the [facility] year.  Patient is also seeing [facility].  Patient states the pain is getting worse will refer patient to interventional pain management in  to see if he qualifies for injections to help with his discomfort as it does sound nerve related.  Patient is currently on gabapentin but states it does not help a whole lot.  Offered to place patient on duloxetine has had in the past was not quite beneficial.  At the end of the visit patient and patient's spouse asked me to refill alprazolam.  Patient is been out of medication for at least 5 days told patient that we will discuss all of his issues in greater detail when he establishes care with me officially.  The problem would be is if I sent in a refill who would refill it before he sees me again.

## 2023-04-01 ENCOUNTER — Telehealth: Payer: Self-pay | Admitting: Family Medicine

## 2023-04-01 DIAGNOSIS — G8929 Other chronic pain: Secondary | ICD-10-CM
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
## 2023-04-03 NOTE — Telephone Encounter (Signed)
Referral placed.

## 2023-04-03 NOTE — Telephone Encounter (Signed)
Patient was seen as acute visit for this. Has not had TOC appointment yet. Do you need to see in office to do referral?

## 2023-04-08 ENCOUNTER — Telehealth: Payer: Self-pay | Admitting: Neurosurgery

## 2023-04-08 NOTE — Telephone Encounter (Signed)
Can you find out if he has had PT or injections if not he can see one of the PA's. If so he will need to see MD. He has had imaging.

## 2023-04-08 NOTE — Telephone Encounter (Signed)
Pt called in, states he's had quite a few seizures in the past year or so. Says that he's started to have swelling and pain in his cervical area. Does have a standing referral to [facility] in [place] but wants [place] to review his history and see if it would be beneficial for him to be seen here as we are closer geographically to him.

## 2023-05-12 ENCOUNTER — Ambulatory Visit: Payer: 59 | Admitting: Nurse Practitioner

## 2023-05-13 ENCOUNTER — Encounter: Payer: Self-pay | Admitting: Family Medicine

## 2023-06-08 ENCOUNTER — Telehealth: Payer: Self-pay | Admitting: Family Medicine

## 2023-06-08 NOTE — Telephone Encounter (Signed)
Patient is scheduled for a new patient appointment 08/17/2023. He called in today stating that his previous doctors office shut down completley,and that he is out of ALPRAZolam (XANAX) 0.25 MG tablet and tiZANidine (ZANAFLEX) 4 MG tablet . Is it okay to schedule him a medication refill appointment ahead of his new patient?Please advise.

## 2023-06-09 NOTE — Telephone Encounter (Signed)
Contacted pt and relayed information regarding no fill until next establish are appointment. Advised to pt that he can go to urgent care if he needs to in the meantime.  Pt understand. Stated okay that is fine.

## 2023-06-09 NOTE — Telephone Encounter (Signed)
No we no longer do acute visit prior to the Establish care visit. He missed his last establish care visit with me. He will need to wait to be seen at his appointment time. He can be seen at urgent care if he feels like he needs to

## 2023-07-02 ENCOUNTER — Other Ambulatory Visit: Payer: Self-pay | Admitting: Student

## 2023-07-02 DIAGNOSIS — F0781 Postconcussional syndrome: Secondary | ICD-10-CM

## 2023-07-21 ENCOUNTER — Ambulatory Visit: Payer: Self-pay | Admitting: *Deleted

## 2023-07-21 ENCOUNTER — Emergency Department
Admission: EM | Admit: 2023-07-21 | Discharge: 2023-07-21 | Disposition: A | Discharge status: Home / Self Care | Payer: 59 | Attending: Emergency Medicine | Admitting: Emergency Medicine

## 2023-07-21 ENCOUNTER — Other Ambulatory Visit: Payer: Self-pay

## 2023-07-21 DIAGNOSIS — L739 Follicular disorder, unspecified: Secondary | ICD-10-CM | POA: Insufficient documentation

## 2023-07-21 DIAGNOSIS — L03115 Cellulitis of right lower limb: Secondary | ICD-10-CM | POA: Diagnosis not present

## 2023-07-21 DIAGNOSIS — R21 Rash and other nonspecific skin eruption: Secondary | ICD-10-CM | POA: Diagnosis present

## 2023-07-21 DIAGNOSIS — D72829 Elevated white blood cell count, unspecified: Secondary | ICD-10-CM | POA: Insufficient documentation

## 2023-07-21 DIAGNOSIS — Z89611 Acquired absence of right leg above knee: Secondary | ICD-10-CM | POA: Insufficient documentation

## 2023-07-21 LAB — CBC
HCT: 42.7 % (ref 39.0–52.0)
Hemoglobin: 14.7 g/dL (ref 13.0–17.0)
MCH: 30.6 pg (ref 26.0–34.0)
MCHC: 34.4 g/dL (ref 30.0–36.0)
MCV: 88.8 fL (ref 80.0–100.0)
Platelets: 417 10*3/uL — ABNORMAL HIGH (ref 150–400)
RBC: 4.81 MIL/uL (ref 4.22–5.81)
RDW: 11.7 % (ref 11.5–15.5)
WBC: 12.3 10*3/uL — ABNORMAL HIGH (ref 4.0–10.5)
nRBC: 0 % (ref 0.0–0.2)

## 2023-07-21 LAB — BASIC METABOLIC PANEL
Anion gap: 10 (ref 5–15)
BUN: 15 mg/dL (ref 6–20)
CO2: 23 mmol/L (ref 22–32)
Calcium: 8.8 mg/dL — ABNORMAL LOW (ref 8.9–10.3)
Chloride: 100 mmol/L (ref 98–111)
Creatinine, Ser: 1.17 mg/dL (ref 0.61–1.24)
GFR, Estimated: >60 mL/min (ref >60)
Glucose, Bld: 117 mg/dL — ABNORMAL HIGH (ref 70–99)
Potassium: 3.9 mmol/L (ref 3.5–5.1)
Sodium: 133 mmol/L — ABNORMAL LOW (ref 135–145)

## 2023-07-21 MED ORDER — LISINOPRIL 5 MG PO TABS: 5.0000 mg | ORAL_TABLET | Freq: Every day | ORAL | 0 refills | Status: DC

## 2023-07-21 MED ORDER — KETOROLAC TROMETHAMINE 30 MG/ML IJ SOLN
30.0000 mg | Freq: Once | INTRAMUSCULAR | Status: AC
  Administered 2023-07-21: 30 mg via INTRAMUSCULAR
  Filled 2023-07-21: qty 1

## 2023-07-21 MED ORDER — CEPHALEXIN 500 MG PO CAPS
500.0000 mg | ORAL_CAPSULE | Freq: Once | ORAL | Status: AC
  Administered 2023-07-21: 500 mg via ORAL
  Filled 2023-07-21: qty 1

## 2023-07-21 MED ORDER — DOXYCYCLINE HYCLATE 100 MG PO TABS
100.0000 mg | ORAL_TABLET | Freq: Once | ORAL | Status: AC
  Administered 2023-07-21: 100 mg via ORAL
  Filled 2023-07-21: qty 1

## 2023-07-21 MED ORDER — CEFADROXIL 500 MG PO CAPS: 500.0000 mg | ORAL_CAPSULE | Freq: Two times a day (BID) | ORAL | 0 refills | Status: AC

## 2023-07-21 MED ORDER — DOXYCYCLINE HYCLATE 100 MG PO CAPS: 100.0000 mg | ORAL_CAPSULE | Freq: Two times a day (BID) | ORAL | 0 refills | Status: AC

## 2023-07-21 NOTE — Telephone Encounter (Signed)
Summary: Missing medications because of Dr. Girtha Rm   Pt called for refills from Dr. Girtha Rm, says he "feels like sh**" says he is out of his alprazolam, lisinopril, nortriptyline, amlodipine.  Best contact: [phone]  Upset that nobody has made an attempt to assist him          Chief Complaint: Medication Symptoms: NA Frequency: NA Pertinent Negatives: Patient denies NA Disposition: [] ED /[] Urgent Care (no appt availability in office) / [] Appointment(In office/virtual)/ []  [place] [facility]/ [] Home Care/ [] Refused Recommended Disposition /[] [facility]/ []  Follow-up with PCP Additional Notes:  Pt of Dr. Jerald Kief calling for medication refills. Number provided [phone]  "Ask for Toniann Fail." Pt verbalizes understanding. Advised to call back if any issues with refills.  Reason for Disposition  [1] Caller has URGENT medicine question about med that PCP or specialist prescribed AND [2] triager unable to answer question  Answer Assessment - Initial Assessment Questions 1. NAME of MEDICINE: "What medicine(s) are you calling about?"     Multiple 2. QUESTION: "What is your question?" (e.g., double dose of medicine, side effect)     Needs refills 3. PRESCRIBER: "Who prescribed the medicine?" Reason: if prescribed by specialist, call should be referred to that group.     Dr. Girtha Rm  Protocols used: Medication Question Call-A-AH

## 2023-07-21 NOTE — Discharge Instructions (Signed)
Please take the full course of both antibiotics prescribed (doxycycline and cefadroxil).  They should help with the areas of infection on your abdomen and your leg.  Please continue taking the regular medications you have at home, and we also sent a prescription refill for your lisinopril.  However the other medications need to be either prescribed by your neurologist (they just recently refilled your gabapentin, for example), or through a psychiatrist or a new primary care doctor.  We have placed a referral to help get you established with a new primary care doctor, but you can also call any of the clinics listed in the paperwork provided to set up a follow-up appointment and to establish care.  Please take over-the-counter ibuprofen and/or Tylenol as needed according to label instructions, follow-up with your regular providers, and return to the emergency department if you develop new or worsening symptoms that concern you.

## 2023-07-21 NOTE — ED Triage Notes (Addendum)
Pt to ED via POV c/o lump on neck that has been there for a few months and rash/redness on groin, lower abdomen, and right limb (where limb meets prosthetic). Denies CP, SOB, fevers, dizziness

## 2023-07-21 NOTE — ED Provider Notes (Signed)
[facility] Provider Note    Event Date/Time   First MD Initiated Contact with Patient 07/21/23 0235     (approximate)   History   Mass and Rash   HPI Joshua [name] is a 32 y.o. male who presents for evaluation of multiple "bumps" that are primarily on his lower abdomen but also along his groin.  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  The patient denies any fever or chills and he has had no systemic symptoms such as nausea or vomiting or chest pain.  He said that first it is just started as 1 or 2 lesions in his groin but then he developed more on the lower abdomen.  There is no other rash.  He does not think he was bitten by anything.  They are a little bit painful but not too bad.  No drainage.  He reports that the stump of his right leg has been more swollen and painful than usual but he did not sustain any injury of which she is aware.  None of the lesions he described have been draining.     Physical Exam   Triage Vital Signs: ED Triage Vitals  Encounter Vitals Group     BP 07/21/23 0125 (!) 152/84     Systolic BP Percentile --      Diastolic BP Percentile --      Pulse Rate 07/21/23 0125 97     Resp 07/21/23 0125 19     Temp 07/21/23 0125 98.1 F (36.7 C)     Temp src --      SpO2 07/21/23 0125 100 %     Weight --      Height --      Head Circumference --      Peak Flow --      Pain Score 07/21/23 0125 0     Pain Loc --      Pain Education --      Exclude from Growth Chart --     Most recent vital signs: Vitals:   07/21/23 0125 07/21/23 0356  BP: (!) 152/84 (!) 148/78  Pulse: 97 (!) 18  Resp: 19 18  Temp: 98.1 F (36.7 C) 98.7 F (37.1 C)  SpO2: 100% 100%    General: Awake, no distress.  CV:  Good peripheral perfusion.  Regular rate and rhythm Resp:  Normal effort. Speaking easily and comfortably, no accessory muscle usage nor  intercostal retractions.   Abd:  No distention.  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  He has no edema that I can appreciate to the remaining part of his limb but he says it is swollen.  There is some mild erythema at the very bottom of the leg where it impacts his prosthesis, but there is no significant tenderness, and no induration nor fluctuance.  Additionally, the patient has multiple small, approximately 1 cm diameter lesions on his lower abdomen that appear most consistent with either insect bites or folliculitis.  No drainable abscesses are palpable including in his groin.  No evidence of candidal infection.  No spreading cellulitis.  No evidence of Fournier's gangrene with normal-appearing perineum and scrotum.   ED Results / Procedures / Treatments   Labs (all labs ordered are listed, but only abnormal results are displayed) Labs Reviewed  CBC - Abnormal; Notable for the following components:      Result Value   WBC 12.3 (*)    Platelets 417 (*)    All other components within normal limits  BASIC METABOLIC PANEL - Abnormal; Notable for the following components:   Sodium 133 (*)    Glucose, Bld 117 (*)    Calcium 8.8 (*)    All other components within normal limits     PROCEDURES:  Critical Care performed: No  Procedures    IMPRESSION / MDM / ASSESSMENT AND PLAN / ED COURSE  I reviewed the triage vital signs and the nursing notes.                              Differential diagnosis includes, but is not limited to, abscess, cellulitis, folliculitis, insect bites, medication or drug side effect.  Patient's presentation is most consistent with acute presentation with potential threat to life or bodily function.  Labs/studies ordered: BMP, CBC  Interventions/Medications given:  Medications  doxycycline (VIBRA-TABS) tablet 100 mg (100 mg Oral Given 07/21/23 0324)  cephALEXin (KEFLEX)  capsule 500 mg (500 mg Oral Given 07/21/23 0324)  ketorolac (TORADOL) 30 MG/ML injection 30 mg (30 mg Intramuscular Given 07/21/23 0323)    (Note:  hospital course my include additional interventions and/or labs/studies not listed above.)   Presentation appears most consistent with mild infection like folliculitis.  Labs are all essentially normal other than a mild leukocytosis of 12.3.  No drainable abscesses or fluid collections on any of the wounds.  I recommended empiric antibiotics with a cephalosporin and doxycycline and I also gave a dose of Toradol 30 mg intramuscular for pain control.  Patient is comfortable with the plan for discharge and outpatient follow-up.  I gave my usual follow-up recommendations and return precautions.         FINAL CLINICAL IMPRESSION(S) / ED DIAGNOSES   Final diagnoses:  Folliculitis  Cellulitis of right lower extremity     Rx / DC Orders   ED Discharge Orders          Ordered    lisinopril (ZESTRIL) 5 MG tablet  Daily        07/21/23 0326    cefadroxil (DURICEF) 500 MG capsule  2 times daily        07/21/23 0326    doxycycline (VIBRAMYCIN) 100 MG capsule  2 times daily        07/21/23 0326    Ambulatory Referral to Primary Care (Establish Care)       Comments: Prior PCP's office reported shut down, and patient needs help with multiple chronic conditions.   07/21/23 0326             Note:  This document was prepared using Dragon voice recognition software and may include unintentional dictation errors.   Loleta Rose, MD 07/21/23 [phone]

## 2023-07-28 ENCOUNTER — Ambulatory Visit: Payer: Self-pay | Admitting: *Deleted

## 2023-07-28 NOTE — Telephone Encounter (Signed)
  Chief Complaint: requesting medication refills and has been without medications x 1 week.  Symptoms: unknown BP . "Feels like sh" Frequency: 1 week  Pertinent Negatives: Patient denies na Disposition: [] ED /[] Urgent Care (no appt availability in office) / [] Appointment(In office/virtual)/ []  [place] [facility]/ [] Home Care/ [] Refused Recommended Disposition /[x] [facility]/ []  Follow-up with PCP Additional Notes:   Recommended patient go to Mobile bus  and gave address.   Summary: Missing medication, Dr. Girtha Rm. Symptomatic   Pt has medication needs, former patient of Dr. Girtha Rm. Symptomatic, and very upset.  From [facility] 07/21/2023: Summary: Missing medications because of Dr. Girtha Rm   Pt called for refills from Dr. Girtha Rm, says he "feels like sh**" says he is out of his alprazolam, lisinopril, nortriptyline, amlodipine.  Best contact: [phone]  Upset that nobody has made an attempt to assist him                  Reason for Disposition  [1] Caller has URGENT medicine question about med that PCP or specialist prescribed AND [2] triager unable to answer question  Answer Assessment - Initial Assessment Questions 1. NAME of MEDICINE: "What medicine(s) are you calling about?"     Xanax, lisinopril, nortriptyline, amlodipine 2. QUESTION: "What is your question?" (e.g., double dose of medicine, side effect)     Why can't I get my refills? 3. PRESCRIBER: "Who prescribed the medicine?" Reason: if prescribed by specialist, call should be referred to that group.     Dr. Girtha Rm 4. SYMPTOMS: "Do you have any symptoms?" If Yes, ask: "What symptoms are you having?"  "How bad are the symptoms (e.g., mild, moderate, severe)     Out of medications x 1 week 5. PREGNANCY:  "Is there any chance that you are pregnant?" "When was your last menstrual period?"     Na  Protocols used: Medication Question Call-A-AH

## 2023-08-17 ENCOUNTER — Telehealth: Payer: Self-pay | Admitting: Family Medicine

## 2023-08-17 ENCOUNTER — Ambulatory Visit: Payer: 59 | Admitting: Nurse Practitioner

## 2023-08-20 ENCOUNTER — Ambulatory Visit: Payer: Medicare Other | Admitting: Family Medicine

## 2023-08-22 ENCOUNTER — Other Ambulatory Visit: Payer: Self-pay

## 2023-08-22 ENCOUNTER — Emergency Department
Admission: EM | Admit: 2023-08-22 | Discharge: 2023-08-22 | Disposition: A | Discharge status: Home / Self Care | Payer: 59 | Attending: Emergency Medicine | Admitting: Emergency Medicine

## 2023-08-22 ENCOUNTER — Emergency Department: Payer: 59

## 2023-08-22 DIAGNOSIS — S7011XA Contusion of right thigh, initial encounter: Secondary | ICD-10-CM | POA: Insufficient documentation

## 2023-08-22 DIAGNOSIS — Y9301 Activity, walking, marching and hiking: Secondary | ICD-10-CM | POA: Insufficient documentation

## 2023-08-22 DIAGNOSIS — M79604 Pain in right leg: Secondary | ICD-10-CM

## 2023-08-22 DIAGNOSIS — W010XXA Fall on same level from slipping, tripping and stumbling without subsequent striking against object, initial encounter: Secondary | ICD-10-CM | POA: Diagnosis not present

## 2023-08-22 MED ORDER — HYDROCODONE-ACETAMINOPHEN 5-325 MG PO TABS: 1.0000 | ORAL_TABLET | Freq: Four times a day (QID) | ORAL | 0 refills | Status: DC | PRN

## 2023-08-22 MED ORDER — HYDROCODONE-ACETAMINOPHEN 5-325 MG PO TABS
1.0000 | ORAL_TABLET | Freq: Once | ORAL | Status: AC
  Administered 2023-08-22: 1 via ORAL
  Filled 2023-08-22: qty 1
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
## 2023-08-22 NOTE — Discharge Instructions (Signed)
You were seen in the emergency room today for evaluation of your leg pain.  Your x-Jessamine Barcia did not show any fractures, but you do have swelling in your soft tissues.  You can continue to take Tylenol and ibuprofen for your pain, have sent a short course of narcotic pain medicine to your pharmacy that you can take as needed for breakthrough pain.  Do not drive or operate machinery when taking this.  Keep your scheduled follow-up with your prosthetic team.  Return to the ER for new or worsening symptoms.

## 2023-08-22 NOTE — ED Provider Notes (Signed)
[facility] Provider Note    Event Date/Time   First MD Initiated Contact with Patient 08/22/23 2117     (approximate)   History   Fall   HPI  Joshua [name] is a 32 year old male with history of right above-the-knee amputation presenting to the Emergency Department for evaluation of leg pain.  A few days ago patient was walking without his prosthetic when he slipped and fell onto his right leg stump.  He has had significant pain in this area since that time, worsened with pressure against his prosthetic.  Did not hit his head and denies injury to other areas.     Physical Exam   Triage Vital Signs: ED Triage Vitals  Encounter Vitals Group     BP 08/22/23 1836 (!) 150/88     Systolic BP Percentile --      Diastolic BP Percentile --      Pulse Rate 08/22/23 1836 87     Resp 08/22/23 1836 16     Temp 08/22/23 1836 98 F (36.7 C)     Temp Source 08/22/23 1836 Oral     SpO2 08/22/23 1836 99 %     Weight --      Height --      Head Circumference --      Peak Flow --      Pain Score 08/22/23 1837 10     Pain Loc --      Pain Education --      Exclude from Growth Chart --     Most recent vital signs: Vitals:   08/22/23 1836  BP: (!) 150/88  Pulse: 87  Resp: 16  Temp: 98 F (36.7 C)  SpO2: 99%     General: Awake, interactive  CV:  Regular rate, good peripheral perfusion.  Resp:  Unlabored respirations.  Abd:  Nondistended.  Neuro:  Symmetric facial movement, fluid speech MSK:  Right above-the-knee amputation, at the location of the stump, there is ecchymosis and tenderness to palpation.  No active drainage.   ED Results / Procedures / Treatments   Labs (all labs ordered are listed, but only abnormal results are displayed) Labs Reviewed - No data to display   EKG EKG independently reviewed interpreted by myself (ER attending) demonstrates:    RADIOLOGY Imaging independently reviewed and interpreted by myself demonstrates:   X-Aniza Shor of the right femur with soft tissue swelling without acute fracture  PROCEDURES:  Critical Care performed: No  Procedures   MEDICATIONS ORDERED IN ED: Medications  HYDROcodone-acetaminophen (NORCO/VICODIN) 5-325 MG per tablet 1 tablet (1 tablet Oral Given 08/22/23 2203)     IMPRESSION / MDM / ASSESSMENT AND PLAN / ED COURSE  I reviewed the triage vital signs and the nursing notes.  Differential diagnosis includes, but is not limited to, soft tissue injury, fracture, no evidence of cellulitis on exam  Patient's presentation is most consistent with acute complicated illness / injury requiring diagnostic workup.  32 year old male presenting with right leg pain at the site of prior amputation.  Vital stable on presentation.  X-[name] demonstrating soft tissue injury without fracture.  Suspect that patient's soft tissue injury is unfortunately recurrently exacerbated in the setting of his prosthetic use.  He does have follow-up next week with his prosthetic team.  Uncontrolled pain with Tylenol and ibuprofen, will DC with short course of narcotic medicine.  Strict return precautions provided.  Patient discharged stable condition.      FINAL CLINICAL IMPRESSION(S) / ED  DIAGNOSES   Final diagnoses:  Pain of right lower extremity     Rx / DC Orders   ED Discharge Orders          Ordered    HYDROcodone-acetaminophen (NORCO) 5-325 MG tablet  Every 6 hours PRN        08/22/23 2216             Note:  This document was prepared using Dragon voice recognition software and may include unintentional dictation errors.   Trinna Post, MD 08/22/23 2219

## 2023-08-25 ENCOUNTER — Ambulatory Visit: Payer: 59 | Admitting: Family Medicine

## 2023-08-25 ENCOUNTER — Encounter: Payer: Self-pay | Admitting: Family Medicine

## 2023-08-25 VITALS — BP 126/89 | HR 96 | Temp 97.9°F | Resp 18 | Ht 70.0 in | Wt 182.8 lb

## 2023-08-25 DIAGNOSIS — F4323 Adjustment disorder with mixed anxiety and depressed mood: Secondary | ICD-10-CM

## 2023-08-25 DIAGNOSIS — T879 Unspecified complications of amputation stump: Secondary | ICD-10-CM

## 2023-08-25 MED ORDER — DOXYCYCLINE HYCLATE 100 MG PO TABS: 100.0000 mg | ORAL_TABLET | Freq: Two times a day (BID) | ORAL | 0 refills | Status: AC

## 2023-08-25 MED ORDER — HYDROCODONE-ACETAMINOPHEN 5-325 MG PO TABS
1.0000 | ORAL_TABLET | Freq: Four times a day (QID) | ORAL | Status: DC | PRN
Start: 2023-08-25 — End: 2023-08-25

## 2023-08-25 MED ORDER — HYDROCODONE-ACETAMINOPHEN 5-325 MG PO TABS: 1.0000 | ORAL_TABLET | Freq: Four times a day (QID) | ORAL | 0 refills | Status: AC | PRN

## 2023-08-25 MED ORDER — HYDROCODONE-ACETAMINOPHEN 5-325MG PREPACK (~~LOC~~: 6.0000 | ORAL_TABLET | Freq: Three times a day (TID) | ORAL | 0 refills | Status: DC
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
## 2023-08-25 NOTE — Progress Notes (Signed)
   Established Patient Office Visit  Subjective   Patient ID: Joshua [name], male    DOB: 06-28-1991  Age: 32 y.o. MRN: [id number]  Chief Complaint  Patient presents with   Medical Management of Chronic Issues    HPI fell on his stump when at home.  Has been to the ED on 12/28.  X-rays were negative.  Cannot put his prosthetic on because his stump is swollen.  Reports the pain is the worst she has ever had in his life 10 out of 10.  He was given hydrocodone  5 mg #7 from the ED and reports this is not strong enough and it is not helping.     ROS    Objective:     BP 126/89 (BP Location: Left Arm, Patient Position: Sitting, Cuff Size: Normal)   Pulse 96   Temp 97.9 F (36.6 C) (Oral)   Resp 18   Ht 5' 10 (1.778 m)   Wt 182 lb 12.8 oz (82.9 kg)   SpO2 95%   BMI 26.23 kg/m    Physical Exam Vitals and nursing note reviewed.  Constitutional:      Appearance: Normal appearance.  HENT:     Head: Normocephalic and atraumatic.  Eyes:     Conjunctiva/sclera: Conjunctivae normal.  Cardiovascular:     Rate and Rhythm: Normal rate and regular rhythm.  Pulmonary:     Effort: Pulmonary effort is normal.     Breath sounds: Normal breath sounds.  Musculoskeletal:     Right lower leg: No edema.     Left lower leg: No edema.  Skin:    General: Skin is warm and dry.     Findings: Bruising and erythema present. No rash.  Neurological:     Mental Status: He is alert and oriented to person, place, and time.  Psychiatric:        Mood and Affect: Mood normal.        Behavior: Behavior normal.        Thought Content: Thought content normal.        Judgment: Judgment normal.          No results found for any visits on 08/25/23.    The ASCVD Risk score (Arnett DK, et al., 2019) failed to calculate for the following reasons:   The 2019 ASCVD risk score is only valid for ages 31 to 35    Assessment & Plan:  AKA stump complication (HCC) Assessment & Plan: He was moving in his  house without a stop home and fell and landed directly on his AKA stump it has been terrifically painful.  He went to the ED on 12/28 and got 7 hydrocodone  5 mg.  He is crying today stating this is not enough pain medicine he has got a 10 out of 10 pain in his stump.  It has gotten erythematous in the last few days.  He is not taking antibiotics.  He denies fever chills or night sweats.  Cannot wear his prosthetic right now because his stump is swollen.      Return in about 3 days (around 08/28/2023).    Indy Prestwood K Traycen Goyer, MD

## 2023-08-25 NOTE — Addendum Note (Signed)
 Addended by: Alease Medina on: 08/25/2023 04:56 PM   Modules accepted: Orders

## 2023-08-25 NOTE — Assessment & Plan Note (Signed)
Is requesting a refill on Xanax.  Advised he cannot take narcotics and Xanax at the same time.  He would rather have pain medication.  Did discuss BuSpar but he is not willing to try this

## 2023-08-28 ENCOUNTER — Ambulatory Visit: Payer: 59 | Admitting: Family Medicine

## 2023-09-02 ENCOUNTER — Telehealth: Payer: Self-pay

## 2023-09-02 NOTE — Telephone Encounter (Signed)
 [facility] from [facility] is requesting refill for Alprazolam 0.5mg . Please advise.

## 2023-09-03 MED ORDER — ALPRAZOLAM 0.5 MG PO TABS: 0.5000 mg | ORAL_TABLET | Freq: Two times a day (BID) | ORAL | 0 refills | Status: DC | PRN

## 2023-09-03 NOTE — Addendum Note (Signed)
 Addended by: Alease Medina on: 09/03/2023 12:17 PM   Modules accepted: Orders

## 2023-09-09 ENCOUNTER — Other Ambulatory Visit: Payer: Self-pay | Admitting: Student

## 2023-09-09 ENCOUNTER — Encounter: Payer: Self-pay | Admitting: *Deleted

## 2023-09-09 DIAGNOSIS — R413 Other amnesia: Secondary | ICD-10-CM

## 2023-09-09 DIAGNOSIS — R569 Unspecified convulsions: Secondary | ICD-10-CM

## 2023-09-09 DIAGNOSIS — F0781 Postconcussional syndrome: Secondary | ICD-10-CM

## 2023-09-21 ENCOUNTER — Other Ambulatory Visit: Payer: 59

## 2023-09-23 ENCOUNTER — Other Ambulatory Visit: Payer: Self-pay | Admitting: Family Medicine

## 2023-09-24 ENCOUNTER — Telehealth: Payer: Self-pay

## 2023-09-24 ENCOUNTER — Ambulatory Visit: Payer: Self-pay | Admitting: Family Medicine

## 2023-09-24 NOTE — Telephone Encounter (Signed)
Copied from CRM [id number]. Topic: Clinical - Medication Question >> Sep 24, 2023  3:52 PM Yolanda T wrote: Reason for CRM: patient stated she would like to have 30 day supply of ALPRAZolam (XANAX) 0.5 MG tablet [Pharmacy Med Name: ALPRAZOLAM 0.5 MG TABLET] so it will last him a month.

## 2023-09-24 NOTE — Telephone Encounter (Signed)
Chief Complaint: wheezing chest pain , requesting increase quantity of alprazolam Symptoms: wheezing, chest pain at times esp with coughing. Productive cough yellow to green mucus. Runny nose. SOB at rest. Reports sweats with chest pain at times but denies chest pain now , requesting increase quantity of xanax to #60.  Frequency: 2-3 days  Pertinent Negatives: Patient denies chest pain now. No difficulty breathing denies fever.  Disposition: [] ED /[] Urgent Care (no appt availability in office) / [x] Appointment(In office/virtual)/ []  [facility]/ [] Home Care/ [] Refused Recommended Disposition /[] [place] [facility]/ []  Follow-up with PCP Additional Notes:  Patient poor historian regarding if chest pain present now or only with coughing. Denies dizziness or lightheadedness. No radiating pain left arm or neck or jaw.   Appt scheduled for tomorrow with PCP. Recommended if sx worsen this pm go to UC/ED or call 911. Patient reports he is completely out of alprazolam. Please advise .        Copied from CRM [id number]. Topic: Clinical - Red Word Triage >> Sep 24, 2023  3:54 PM Joshua [name] T wrote: Red Word that prompted transfer to Nurse Triage: patient called said is wheezing and chest pain Reason for Disposition  [1] MILD difficulty breathing (e.g., minimal/no SOB at rest, SOB with walking, pulse <100) AND [2] NEW-onset or WORSE than normal  Answer Assessment - Initial Assessment Questions 1. RESPIRATORY STATUS: "Describe your breathing?" (e.g., wheezing, shortness of breath, unable to speak, severe coughing)      Chest pain wheezing coughing SOB at rest  2. ONSET: "When did this breathing problem begin?"      2-3 days ago  3. PATTERN "Does the difficult breathing come and go, or has it been constant since it started?"      Worse with coughing  4. SEVERITY: "How bad is your breathing?" (e.g., mild, moderate, severe)    - MILD: No SOB at rest, mild SOB with walking, speaks normally in  sentences, can lie down, no retractions, pulse < 100.    - MODERATE: SOB at rest, SOB with minimal exertion and prefers to sit, cannot lie down flat, speaks in phrases, mild retractions, audible wheezing, pulse 100-120.    - SEVERE: Very SOB at rest, speaks in single words, struggling to breathe, sitting hunched forward, retractions, pulse > 120      SOB at rest  5. RECURRENT SYMPTOM: "Have you had difficulty breathing before?" If Yes, ask: "When was the last time?" and "What happened that time?"      Na  6. CARDIAC HISTORY: "Do you have any history of heart disease?" (e.g., heart attack, angina, bypass surgery, angioplasty)      na 7. LUNG HISTORY: "Do you have any history of lung disease?"  (e.g., pulmonary embolus, asthma, emphysema)     Hx asthma  8. CAUSE: "What do you think is causing the breathing problem?"      bronchitis 9. OTHER SYMPTOMS: "Do you have any other symptoms? (e.g., dizziness, runny nose, cough, chest pain, fever)     Feels similar to bronchitis. Runny nose , cough yellow mucus . SOB at rest chest pain at times denies now.  10. O2 SATURATION MONITOR:  "Do you use an oxygen saturation monitor (pulse oximeter) at home?" If Yes, ask: "What is your reading (oxygen level) today?" "What is your usual oxygen saturation reading?" (e.g., 95%)       na 11. PREGNANCY: "Is there any chance you are pregnant?" "When was your last menstrual period?"  na 12. TRAVEL: "Have you traveled out of the country in the last month?" (e.g., travel history, exposures)       na  Protocols used: Breathing Difficulty-A-AH

## 2023-09-25 ENCOUNTER — Encounter: Payer: Self-pay | Admitting: Family Medicine

## 2023-09-25 ENCOUNTER — Ambulatory Visit (INDEPENDENT_AMBULATORY_CARE_PROVIDER_SITE_OTHER): Payer: 59 | Admitting: Family Medicine

## 2023-09-25 VITALS — BP 161/100 | HR 97 | Temp 97.8°F | Resp 16 | Ht 70.0 in | Wt 196.0 lb

## 2023-09-25 DIAGNOSIS — F411 Generalized anxiety disorder: Secondary | ICD-10-CM

## 2023-09-25 DIAGNOSIS — F419 Anxiety disorder, unspecified: Secondary | ICD-10-CM | POA: Diagnosis not present

## 2023-09-25 DIAGNOSIS — I10 Essential (primary) hypertension: Secondary | ICD-10-CM | POA: Insufficient documentation

## 2023-09-25 DIAGNOSIS — M519 Unspecified thoracic, thoracolumbar and lumbosacral intervertebral disc disorder: Secondary | ICD-10-CM | POA: Insufficient documentation

## 2023-09-25 DIAGNOSIS — M5020 Other cervical disc displacement, unspecified cervical region: Secondary | ICD-10-CM | POA: Insufficient documentation

## 2023-09-25 MED ORDER — ALPRAZOLAM 0.5 MG PO TABS
0.5000 mg | ORAL_TABLET | Freq: Two times a day (BID) | ORAL | 0 refills | Status: DC | PRN
Start: 2023-10-02 — End: 2023-10-22

## 2023-09-25 MED ORDER — LISINOPRIL 5 MG PO TABS
5.0000 mg | ORAL_TABLET | Freq: Every day | ORAL | 3 refills | Status: AC
Start: 2023-09-25 — End: ?

## 2023-09-25 MED ORDER — AMLODIPINE BESYLATE 5 MG PO TABS
5.0000 mg | ORAL_TABLET | Freq: Every day | ORAL | 1 refills | Status: DC
Start: 2023-09-25 — End: 2024-03-10

## 2023-09-25 NOTE — Assessment & Plan Note (Signed)
He is not taking his lisinopril or amlodipine.  Discussed that HTN is the silent killer.  Sen his BP medications to the pharmacy
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
## 2023-09-28 ENCOUNTER — Ambulatory Visit
Admission: RE | Admit: 2023-09-28 | Discharge: 2023-09-28 | Disposition: A | Discharge status: Home / Self Care | Payer: 59 | Source: Ambulatory Visit | Attending: Student

## 2023-09-28 DIAGNOSIS — F0781 Postconcussional syndrome: Secondary | ICD-10-CM

## 2023-09-28 DIAGNOSIS — R569 Unspecified convulsions: Secondary | ICD-10-CM

## 2023-09-28 DIAGNOSIS — R413 Other amnesia: Secondary | ICD-10-CM

## 2023-10-18 ENCOUNTER — Other Ambulatory Visit: Payer: Self-pay | Admitting: Family Medicine

## 2023-10-18 DIAGNOSIS — F419 Anxiety disorder, unspecified: Secondary | ICD-10-CM

## 2023-10-20 ENCOUNTER — Ambulatory Visit (INDEPENDENT_AMBULATORY_CARE_PROVIDER_SITE_OTHER): Payer: 59 | Admitting: Family Medicine

## 2023-10-20 ENCOUNTER — Encounter: Payer: Self-pay | Admitting: Family Medicine

## 2023-10-20 ENCOUNTER — Other Ambulatory Visit: Payer: Self-pay | Admitting: Family Medicine

## 2023-10-20 ENCOUNTER — Ambulatory Visit: Payer: Self-pay | Admitting: Family Medicine

## 2023-10-20 VITALS — BP 137/83 | HR 97 | Temp 98.0°F | Resp 18 | Ht 70.0 in | Wt 200.0 lb

## 2023-10-20 DIAGNOSIS — J029 Acute pharyngitis, unspecified: Secondary | ICD-10-CM | POA: Diagnosis not present

## 2023-10-20 MED ORDER — TRIAMCINOLONE ACETONIDE 55 MCG/ACT NA AERO: 2.0000 | INHALATION_SPRAY | Freq: Every day | NASAL | 12 refills | Status: DC

## 2023-10-20 MED ORDER — LORATADINE 10 MG PO TABS: 10.0000 mg | ORAL_TABLET | Freq: Every day | ORAL | 3 refills | Status: DC

## 2023-10-20 NOTE — Patient Instructions (Signed)
 Vitamin Regimen:  Vitamin C 500mg  twice daily  Vitamin D 5000 units once daily  Zinc 50-75mg  once daily   Over the counter Medications (*unless allergic or contraindicated*):  Use Tylenol (acetaminophen) for fever/pain Use Advil (ibuprofen) for fever/pain/inflammation   Non-Medication Therapy:  Drink plenty of fluids and stay hydrated.  A teaspoon of honey may help ease coughing symptoms.  Cough drops or hard candy for coughing.   Over the Counter Medication Therapy:  Use a cough expectorant such as guaifenesin (Mucinex) if recommended by your doctor for a wet, congested cough. If you have high blood pressure, please ask your doctor first before using this.  Use a cough suppressant such as dextromethorphan (Robitussin/Delsym) for a dry cough. If you have high blood pressure, please ask your doctor first before using this.  If you have high blood pressure, medication such as Coricidin HBP is safe to take for your cough and will not increase your blood pressure.

## 2023-10-20 NOTE — Telephone Encounter (Signed)
  Chief Complaint: sore throat, difficulty/painful swallowing Symptoms: painful swallowing, sore throat, cough, mild SOB, wheezing in the morning when he wakes up Frequency: yesterday Pertinent Negatives: Patient denies fever, swelling to tongue or face, oral bumps or sores, runny nose, nasal congestion. Disposition: [] ED /[] Urgent Care (no appt availability in office) / [x] Appointment(In office/virtual)/ []  [facility] [facility]/ [] Home Care/ [] Refused Recommended Disposition /[] [place] [facility]/ []  Follow-up with PCP Additional Notes: Patient c/o difficulty swallowing, painful swallowing. States he is able to drink liquids and swallow food but it is painful. He states he has not look at his throat and is unsure if it is red, swollen or any pus on tonsils. He does state tonsils are painful. Patient states he thinks this is due to his living conditions in his home. Patient speaking in full sentences, no wheezing or labored breathing audible. Patient agreeable to acute visit today with PCP.  Copied from CRM [id number]. Topic: Clinical - Red Word Triage >> Oct 20, 2023 11:52 AM Shon Hale wrote: Red Word that prompted transfer to Nurse Triage: Difficulty swallowing Reason for Disposition  [1] Swallowing difficulty AND [2] cause unknown  (Exception: Difficulty swallowing is a chronic symptom.)  Answer Assessment - Initial Assessment Questions 1. DESCRIPTION: "Tell me more about this problem." "Are you  having trouble swallowing liquids, solids, or both?" "Any trouble with swallowing saliva (spit)?"     Patient states it feels like it is hard to swallow. States he can swallow liquids but it is painful. Patient states he has noticed drool on his pillows.  2. SEVERITY: "How bad is the swallowing difficulty?"  (e.g., Scale 1-10; or mild, moderate, severe)   - MILD (0-3): Occasional swallowing difficulty; has trouble swallowing certain types of foods or liquids.   - MODERATE (4-7): Frequent swallowing  difficulty; only able to swallow small amounts of foods and fluids.   - SEVERE (8-10): Unable to swallow any foods, fluids, or saliva; sensation of "lump in throat" or "something stuck in throat", and frequent drooling or spitting may be present.     Patient states it is painful to swallow but he is able to swallow liquids and food.  3. ONSET: "When did the swallowing problems begin?"      Yesterday.  4. CAUSE: "What do you think is causing the problem?"  (e.g., dry mouth, food or pill stuck in throat, mouth pain, sore throat, progression of disease process such as dementia or Parkinson's disease).      Patient states he thinks has to do with the house he is living in, states it is an old mill house and states it is falling apart.  5. CHRONIC or RECURRENT: "Is this a new problem for you?"  If No, ask: "How long have you had this problem?" (e.g., days, weeks, months)      New problem x 1 day.  6. OTHER SYMPTOMS: "Do you have any other symptoms?" (e.g., chest pain, difficulty breathing, mouth sores, sore throat, swollen tongue, chest pain)     Cold sweats, difficulty breathing "a little bit" states that started yesterday and states talking or walking he feels SOB.  Protocols used: Swallowing Difficulty-A-AH

## 2023-10-20 NOTE — Telephone Encounter (Signed)
 Last Fill: 10/02/23 30 tabs/0 RF  Last OV: 08/25/23 Next OV: 10/20/23 ACUTE  Routing to provider for review/authorization.

## 2023-10-20 NOTE — Telephone Encounter (Unsigned)
 Copied from CRM [id number]. Topic: Clinical - Medication Refill >> Oct 20, 2023  3:37 PM Tiffany B wrote: Most Recent Primary Care Visit:  Provider: Eden Emms  Department: [facility]  Visit Type: OFFICE VISIT  Date: 03/17/2023  Medication: ALPRAZolam Prudy Feeler) 0.5 MG tablet  Has the patient contacted their pharmacy? Yes   Is this the correct pharmacy for this prescription? Yes If no, delete pharmacy and type the correct one.  This is the patient's preferred pharmacy:  [facility] [address], [place] - [address] [place] [address] Phone: [phone] Fax: [phone]     Has the prescription been filled recently? Yes  Is the patient out of the medication? Yes  Has the patient been seen for an appointment in the last year OR does the patient have an upcoming appointment? Yes  Can we respond through MyChart? No  Agent: Please be advised that Rx refills may take up to 3 business days. We ask that you follow-up with your pharmacy.

## 2023-10-20 NOTE — Progress Notes (Signed)
 Acute Care Office Visit  Subjective:   Joshua [name] 1991/06/16 10/20/2023  Chief Complaint  Patient presents with   URI   Anxiety    HPI: Presents today for an acute visit with complaint of constant sore throat and difficult to swallow.  Symptoms have been present for the past 2 days.  Associated symptoms include: sinus pain/pressure, nasal congestion, muffled sounds (a little bit), chills/sweating.  Pertinent negatives: headaches, difficulty breathing, shortness of breath, chest pain  Pain severity: 7/10 Treatments tried include: nothing OTC  Treatment effective: unable to determine  Sick contacts: no   History of asthma & seasonal allergies  Does not take any OTC medications   The following portions of the patient's history were reviewed and updated as appropriate: past medical history, past surgical history, family history, social history, allergies, medications, and problem list.   Patient Active Problem List   Diagnosis Date Noted   HNP (herniated nucleus pulposus), cervical 09/25/2023   Lumbar disc disease 09/25/2023   Hypertension 09/25/2023   AKA stump complication (HCC) 08/25/2023   Seasonal allergies 12/09/2019   Body mass index 26.0-26.9, adult 12/09/2019   History of seizures 10/21/2019   MDD (major depressive disorder), single episode, moderate (HCC) 03/30/2019   GAD (generalized anxiety disorder) 03/30/2019   Cocaine use disorder, severe, in sustained remission (HCC) 03/30/2019   Opioid use disorder, moderate, in sustained remission (HCC) 03/30/2019   Tobacco user 03/30/2019   Anxiety, generalized 11/13/2018   History of renal failure 09/02/2018   Seizure disorder (HCC) 09/02/2018   S/P AKA (above knee amputation), 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   Impotence 12/08/2017   Erectile dysfunction 12/08/2017   Chronic bilateral low back pain without sciatica 12/08/2017   Opioid dependence (HCC) 12/04/2017   Polysubstance abuse (HCC) 12/03/2017   Adjustment  disorder with mixed anxiety and depressed mood 11/13/2017   At risk for abuse of opiates 11/13/2017   Disease characterized by destruction of skeletal muscle 11/02/2017   Cardiogenic shock (HCC) 10/30/2017   Cardiac arrest (HCC) 10/30/2017   Past Medical History:  Diagnosis Date   Acute renal failure (ARF) (HCC)    History of    Aspiration pneumonia (HCC)    Hx of   Asthma    Bronchitis    Cardiac arrest (HCC)    hx of   Cardiogenic shock (HCC)    hx of   Drug overdose    Hernia cerebri (HCC)    History of acute respiratory failure    Hypertension    Neuropathy    Polysubstance abuse (HCC)    Rhabdomyolysis    Seizures (HCC)    Past Surgical History:  Procedure Laterality Date   AMPUTATION Right 11/03/2017   Procedure: KNEE DISARTICULATION;  Surgeon: Renford Dills, MD;  Location: [facility];  Service: Vascular;  Laterality: Right;   AMPUTATION Right 11/10/2017   Procedure: AMPUTATION ABOVE KNEE;  Surgeon: Renford Dills, MD;  Location: [facility];  Service: Vascular;  Laterality: Right;   ankle/foot surgery with metal plates     CERVICAL SPINE SURGERY     CONDYLOMA EXCISION/FULGURATION N/A 03/19/2021   Procedure: CONDYLOMA REMOVAL WITH Co2 LASER;  Surgeon: Riki Altes, MD;  Location: [facility];  Service: Urology;  Laterality: N/A;   DIALYSIS/PERMA CATHETER INSERTION N/A 11/16/2017   Procedure: DIALYSIS/PERMA CATHETER INSERTION;  Surgeon: Annice Needy, MD;  Location: [facility];  Service: Cardiovascular;  Laterality: N/A;   DIALYSIS/PERMA CATHETER REMOVAL N/A 12/14/2017  Procedure: DIALYSIS/PERMA CATHETER REMOVAL;  Surgeon: Annice Needy, MD;  Location: [facility];  Service: Cardiovascular;  Laterality: N/A;   permacath to right side     Scheduled to be removed   Family History  Problem Relation Age of Onset   Depression Mother    Bipolar disorder Mother    Varicose Veins Neg Hx    Outpatient Medications Prior to Visit  Medication Sig  Dispense Refill   albuterol (VENTOLIN HFA) 108 (90 Base) MCG/ACT inhaler Inhale 2 puffs into the lungs every 6 (six) hours as needed for wheezing or shortness of breath. 8 g 2   ALPRAZolam (XANAX) 0.5 MG tablet Take 1 tablet (0.5 mg total) by mouth 2 (two) times daily as needed for anxiety. 30 tablet 0   ALPRAZolam (XANAX) 0.5 MG tablet Take 1 tablet (0.5 mg total) by mouth 2 (two) times daily as needed for anxiety. 30 tablet 0   amLODipine (NORVASC) 5 MG tablet Take 1 tablet (5 mg total) by mouth daily. 90 tablet 1   fluticasone-salmeterol (ADVAIR HFA) 45-21 MCG/ACT inhaler Inhale 2 puffs into the lungs 2 (two) times daily. 1 each 12   gabapentin (NEURONTIN) 300 MG capsule Take 1 capsule (300 mg total) by mouth 3 (three) times daily. (Patient taking differently: Take 600 mg by mouth 4 (four) times daily.) 90 capsule 3   hydrocortisone 1 % ointment Apply 1 application topically 2 (two) times daily. 30 g 0   lamoTRIgine (LAMICTAL) 100 MG tablet Take 100 mg by mouth 2 (two) times daily.     lisinopril (ZESTRIL) 5 MG tablet Take 1 tablet (5 mg total) by mouth daily. 30 tablet 0   lisinopril (ZESTRIL) 5 MG tablet Take 1 tablet (5 mg total) by mouth daily. 90 tablet 3   methocarbamol (ROBAXIN) 500 MG tablet Take 1 tablet (500 mg total) by mouth 4 (four) times daily. 30 tablet 0   Misc. Devices MISC Right leg Prosthetic. Dx: Right AKA 1 each 0   nortriptyline (PAMELOR) 10 MG capsule Take 30 mg nightly for 1 week, then increase to 40 mg nightly     tiZANidine (ZANAFLEX) 4 MG tablet Take 4 mg by mouth 3 (three) times daily.     cyclobenzaprine (FLEXERIL) 10 MG tablet Take 1 tablet (10 mg total) by mouth 3 (three) times daily as needed. (Patient not taking: Reported on 10/20/2023) 30 tablet 0   No facility-administered medications prior to visit.   Allergies  Allergen Reactions   Zoloft [Sertraline] Hives and Rash     ROS: A complete ROS was performed with pertinent positives/negatives noted in the  HPI. The remainder of the ROS are negative.    Objective:   Today's Vitals   10/20/23 1304  BP: 137/83  Pulse: 97  Resp: 18  Temp: 98 F (36.7 C)  TempSrc: Oral  SpO2: 95%  Weight: 200 lb (90.7 kg)  Height: 5\' 10"  (1.778 m)  PainSc: 7   PainLoc: Throat    GENERAL: Well-appearing, in NAD. Well nourished.  SKIN: Pink, warm and dry. No rash, lesion, ulceration, or ecchymoses.  Head: Normocephalic. NECK: Trachea midline. Full ROM w/o pain or tenderness. No lymphadenopathy.  EARS: Tympanic membranes are intact, translucent without bulging and without drainage. Appropriate landmarks visualized.  EYES: Conjunctiva clear without exudates. EOMI, PERRL, no drainage present.  NOSE: Septum midline w/o deformity. Nares patent. Mucosa slightly inflamed w/o drainage. Sinus tenderness in L maxillary area.  THROAT: Uvula midline. Oropharynx with erythema. Tonsils 2+  inflamed with white exudate. Mucous membranes pink and moist. No cervical lymphadenopathy present.  RESPIRATORY: Chest wall symmetrical. Respirations even and non-labored. Breath sounds clear to auscultation bilaterally.  CARDIAC: S1, S2 present, regular rate and rhythm without murmur or gallops. Peripheral pulses 2+ bilaterally.  MSK: Muscle tone and strength appropriate for age. Joints w/o tenderness, redness, or swelling.  EXTREMITIES: Without clubbing, cyanosis, or edema.  NEUROLOGIC: No motor or sensory deficits. Steady, even gait. C2-C12 intact.  PSYCH/MENTAL STATUS: Alert, oriented x 3. Cooperative, appropriate mood and affect.     Assessment & Plan:   1. Sore throat (Primary) Patient is a 33 year old male patient who presents today for concerns regarding sore throat and painful swallowing x2 days. Physical exam remarkable for erythema of posterior oropharynx with tonsils 2+ and white exudate present. No cervical lymphadenopathy present. Bilateral ears with TM intact, translucent without bulging or drainage. Lungs clear to  auscultation in all lung fields. Cardiovascular exam with regular rate and rhythm. Due to erythema, tonsillar swelling, and presence of exudate will send throat culture (no rapid strep tests available in office) and treat accordingly. Discussed symptomatic treatment. Will notify patient if antibiotics are necessary based on results.   - Strep Gp A Detection, NAA  Lab Orders         Strep Gp A Detection, NAA      Return in about 3 months (around 01/17/2024), or if symptoms worsen or fail to improve, for Mood f/u.    Patient to reach out to office if new, worrisome, or unresolved symptoms arise or if no improvement in patient's condition. Patient verbalized understanding and is agreeable to treatment plan. All questions answered to patient's satisfaction.    Alyson Reedy, FNP -

## 2023-10-22 ENCOUNTER — Telehealth: Payer: Self-pay

## 2023-10-22 ENCOUNTER — Ambulatory Visit: Payer: Self-pay | Admitting: Family Medicine

## 2023-10-22 ENCOUNTER — Other Ambulatory Visit: Payer: Self-pay | Admitting: Family Medicine

## 2023-10-22 DIAGNOSIS — F411 Generalized anxiety disorder: Secondary | ICD-10-CM

## 2023-10-22 MED ORDER — ALPRAZOLAM 0.5 MG PO TABS
0.5000 mg | ORAL_TABLET | Freq: Two times a day (BID) | ORAL | 0 refills | Status: DC | PRN
Start: 2023-10-22 — End: 2023-12-04

## 2023-10-22 NOTE — Telephone Encounter (Signed)
 Copied from CRM [id number]. Topic: Clinical - Medication Question >> Oct 21, 2023 11:56 AM Patsy Lager T wrote: Reason for CRM: patient called stated he is past due on his ALPRAZolam (XANAX) 0.5 MG tablet and he really need his meds. He is aware of the 3 day turn around time frame   Patient has refills at the pharmacy on file.

## 2023-10-22 NOTE — Telephone Encounter (Signed)
 E2C2 Nurse advised [facility]'s [facility] and pt is anxious and out of meds

## 2023-10-22 NOTE — Telephone Encounter (Signed)
  Chief Complaint: Medication refill  Disposition: [] ED /[] Urgent Care (no appt availability in office) / [] Appointment(In office/virtual)/ []  [place] [facility]/ [] Home Care/ [] Refused Recommended Disposition /[] [place] [facility]/ [x]  Follow-up with PCP Additional Notes: Patient calls reporting he needs his refill of xanax. [facility]. [facility], spoke to [place], who reports they do not have the refill. Contacted [facility], spoke with Laquita who advised she will forward information to team. Alerting PCP for review and f/u.   Copied from CRM [id number]. Topic: Clinical - Red Word Triage >> Oct 22, 2023 11:19 AM Hector Shade B wrote: [facility] that prompted transfer to Nurse Triage: patient is out of his alprazolam he states he gets only a 15 days supply and Is out of his medication for the past few days and anxiety through the roof. Reason for Disposition  [1] Caller has URGENT medicine question about med that PCP or specialist prescribed AND [2] triager unable to answer question  Answer Assessment - Initial Assessment Questions 1. NAME of MEDICINE: "What medicine(s) are you calling about?"     Xanax 2. QUESTION: "What is your question or concern?" (e.g., side effect, took wrong dose, how to take, need more medicine)     Patient reports he is out of this medication and there are no refills 3. PRESCRIBER: "Who prescribed the medicine?" The triager should check to see if the medicine is on the patient's hospice medication list.     PCP 4. REFILL NEEDED: If additional supply or refill of medicine is needed, ask: "How much medicine do you have left right now?" (e.g., how many pills, how many days' supply)     States he is out 5. SYMPTOMS: "Do you (your loved one; patient) have any symptoms?" If Yes, ask: "What symptoms are you having?" "How bad are the symptoms?" (e.g., mild, moderate, severe)     Increased anxity 6. CALLER's COPING: "How are you doing?" "How are other  family members and loved ones doing?"     Patient was cussing at this RN  Answer Assessment - Initial Assessment Questions 1. NAME of MEDICINE: "What medicine(s) are you calling about?"     Xanax 2. QUESTION: "What is your question?" (e.g., double dose of medicine, side effect)     Patient reports he is out of medication 3. PRESCRIBER: "Who prescribed the medicine?" Reason: if prescribed by specialist, call should be referred to that group.     PCP 4. SYMPTOMS: "Do you have any symptoms?" If Yes, ask: "What symptoms are you having?"  "How bad are the symptoms (e.g., mild, moderate, severe)     Increased anxiety  Protocols used: Hospice - Medication Question or Refill Call-A-AH, Medication Question Call-A-AH

## 2023-10-24 LAB — CULTURE, GROUP A STREP

## 2023-10-24 LAB — STREP GP A DETECTION, NAA

## 2023-10-24 LAB — SPECIMEN STATUS REPORT

## 2023-10-28 ENCOUNTER — Ambulatory Visit: Admitting: Family Medicine

## 2023-10-28 ENCOUNTER — Ambulatory Visit: Payer: Self-pay | Admitting: Family Medicine

## 2023-10-28 NOTE — Telephone Encounter (Signed)
 Copied from CRM [id number]. Topic: Clinical - Red Word Triage >> Oct 28, 2023 10:54 AM Joshua [name] wrote: Red Word that prompted transfer to Nurse Triage: hearing loss, shaking paralysis, headaches, dementia, bladder issues, seizures. Patient called to make an apt for the above issues.  States he was seen a couple weeks ago.

## 2023-10-29 ENCOUNTER — Ambulatory Visit: Admitting: Family Medicine

## 2023-10-30 ENCOUNTER — Encounter: Payer: Self-pay | Admitting: Family Medicine

## 2023-10-30 ENCOUNTER — Ambulatory Visit: Admitting: Family Medicine

## 2023-10-30 ENCOUNTER — Ambulatory Visit: Payer: Self-pay | Admitting: Family Medicine

## 2023-10-30 NOTE — Telephone Encounter (Signed)
 Copied from CRM [id number]. Topic: Clinical - Red Word Triage >> Oct 30, 2023  2:21 PM Joshua [name] wrote: Red Word that prompted transfer to Nurse Triage: Patient states he's sweating, limbs are shaking and experiencing  memory loss for 2 weeks.  Chief Complaint: Anxiety Symptoms: Shaking, memory loss, sweating Frequency: 2 weeks Pertinent Negatives: Patient denies SI Disposition: [x] ED /[] Urgent Care (no appt availability in office) / [x] Appointment(In office/virtual)/ []  [name]/ [] Home Care/ [] Refused Recommended Disposition /[] [place] [facility]/ []  Follow-up with PCP Additional Notes: Patient called in to report increased shaking, memory loss and sweating over the past 2 weeks. Patient reported that he believes these symptoms are due to increased anxiety. Patient was noticeably upset while on the phone with this RN. Patient had to take a second to calm down. This RN completed the triage with the patient's girlfriend. Patient denied thoughts of harming self or others. Patient is requesting to be prescribed a higher dosage of Xanax or another medication to help with anxiety. Patient also wanted to report that he has been having sharp pains in his brain. This RN attempted to schedule patient in the office. This RN received a notification that the patient had been dismissed from the practice. This RN called the [facility]. Betsy at the front desk informed this RN that the patient had been dismissed due to multiple back to back no shows. Betsy advised this RN to route this conversation to the clinic for review and consideration of scheduling an appointment. This RN advised patient that I would be routing this conversation to the clinic for their discretion on scheduling. This RN advised patient to seek care at the ED if symptoms worsen. Patient complied.   Reason for Disposition  Patient sounds very upset or troubled to the triager  Answer Assessment - Initial Assessment Questions 1. CONCERN:  "Did anything happen that prompted you to call today?"      Feels that anxiety has been worsening 2. ANXIETY SYMPTOMS: "Can you describe how you (your loved one; patient) have been feeling?" (e.g., tense, restless, panicky, anxious, keyed up, overwhelmed, sense of impending doom).      Sweating, shakiness, memory loss, staring off into space, patient was crying while on the phone with this RN 3. ONSET: "How long have you been feeling this way?" (e.g., hours, days, weeks)     2 weeks ago 4. SEVERITY: "How would you rate the level of anxiety?" (e.g., 0 - 10; or mild, moderate, severe).     Moderate-severe, states he cannot sit still, patient was crying while on the phone with this RN 5. FUNCTIONAL IMPAIRMENT: "How have these feelings affected your ability to do daily activities?" "Have you had more difficulty than usual doing your normal daily activities?" (e.g., getting better, same, worse; self-care, school, work, interactions)     States he has been experiencing memory loss 6. HISTORY: "Have you felt this way before?" "Have you ever been diagnosed with an anxiety problem in the past?" (e.g., generalized anxiety disorder, panic attacks, PTSD). If Yes, ask: "How was this problem treated?" (e.g., medicines, counseling, etc.)     History of anxiety 7. RISK OF HARM - SUICIDAL IDEATION: "Do you ever have thoughts of hurting or killing yourself?" If Yes, ask:  "Do you have these feelings now?" "Do you have a plan on how you would do this?"     Denies 8. TREATMENT:  "What has been done so far to treat this anxiety?" (e.g., medicines, relaxation strategies). "What  has helped?"     Xanax 9. TREATMENT - THERAPIST: "Do you have a counselor or therapist? Name?"     Denies, but expressed interest in seeing a therapist 10. POTENTIAL TRIGGERS: "Do you drink caffeinated beverages (e.g., coffee, colas, teas), and how much daily?" "Do you drink alcohol or use any drugs?" "Have you started any new medicines  recently?"       N/A 11. PATIENT SUPPORT: "Who is with you now?" "Who do you live with?" "Do you have family or friends who you can talk to?"        Girlfriend at home  49. OTHER SYMPTOMS: "Do you have any other symptoms?" (e.g., feeling depressed, trouble concentrating, trouble sleeping, trouble breathing, palpitations or fast heartbeat, chest pain, sweating, nausea, or diarrhea)       Shaking, sweating, staring off into space and memory loss  Protocols used: Anxiety and Panic Attack-A-AH

## 2023-11-02 ENCOUNTER — Ambulatory Visit: Payer: Self-pay | Admitting: Family Medicine

## 2023-11-02 NOTE — Telephone Encounter (Signed)
  Chief Complaint: pt with numerous Ccs-HA, numbness, Symptoms: memory loss, falls, brain pain, posterior head swelling, hearing loss, unilateral arm numbness, confusion, tremors, potential cancerous spot on neck,  Frequency: ongoing  Disposition: [x] ED /[] Urgent Care (no appt availability in office) / [] Appointment(In office/virtual)/ []  [place] [facility]/ [] Home Care/ [] Refused Recommended Disposition /[]  Mobile Bus/ []  Follow-up with PCP Additional Notes: Pt states that he has chronic brain conditions. Pt states that he feels it has gotten worse. Pt states that he has studied SS and believes he has it. Pt also has spot on his neck that he feels might be cancerous. Pt states that he has fallen a lot lately d/t his chronic brain condition. 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 Pt states that he is losing his hearing. Pt states he has intermittent HA, states it is like a light switch and he currently has it. Pt states that he has been having tremors. During call pt was reading off list of s/s that he had recorded. Pt states that he "would like an EGD with neuro".  Pt states that he has been having unilateral arm numbness. Pt states that he has been having confusion, memory issues, neck pain, "head pain, but not headache", head swelling. Pt states that the head swelling occurs only in the back of his head and it occurs prior to a sz. Pt states that he is having a HA 10/10 and R arm numbness during the call, pt advised to seek care at an ED at this time. Pt agreeable.   Reason for Disposition  [1] SEVERE headache (e.g., excruciating) AND [2] "worst headache" of life  Answer Assessment - Initial Assessment Questions 1. LOCATION: "Where does it hurt?"      In the back 2. ONSET: "When did the headache start?" (Minutes, hours or days)      Its been going on a long time, awhile,  3. PATTERN: "Does the pain come and go, or has it been constant since it started?"     intermittent 4. SEVERITY: "How bad  is the pain?" and "What does it keep you from doing?"  (e.g., Scale 1-10; mild, moderate, or severe)   - MILD (1-3): doesn't interfere with normal activities    - MODERATE (4-7): interferes with normal activities or awakens from sleep    - SEVERE (8-10): excruciating pain, unable to do any normal activities        10 5. RECURRENT SYMPTOM: "Have you ever had headaches before?" If Yes, ask: "When was the last time?" and "What happened that time?"      Recently just started,  6. CAUSE: "What do you think is causing the headache?"     unknown 7. MIGRAINE: "Have you been diagnosed with migraine headaches?" If Yes, ask: "Is this headache similar?"      denies 8. HEAD INJURY: "Has there been any recent injury to the head?"      Yes, fell, "I can't remember due to my memory loss" 9. OTHER SYMPTOMS: "Do you have any other symptoms?" (fever, stiff neck, eye pain, sore throat, cold symptoms)     Tremors, little eye pain,  Protocols used: Headache-A-AH

## 2023-11-05 ENCOUNTER — Ambulatory Visit: Payer: Self-pay | Admitting: Family Medicine

## 2023-11-05 NOTE — Telephone Encounter (Signed)
 Copied from CRM [id number]. Topic: Clinical - Red Word Triage >> Nov 05, 2023 10:53 AM Payton Doughty wrote: Red Word that prompted transfer to Nurse Triage: chest pain/poss bronchitis/cough/maybe PNA, every breathe chest hurts/headaches/cold chills  Chief Complaint: chest pain Symptoms: 5 to 6/10 chest pain all over chest, constant, chest is heavy, SOB, nausea, dizziness Frequency: x 3 days Pertinent Negatives: Patient denies fever Disposition: [] ED /[] Urgent Care (no appt availability in office) / [] Appointment(In office/virtual)/ []  [place] [facility]/ [] Home Care/ [] Refused Recommended Disposition /[x] [place] [facility]/ []  Follow-up with PCP Additional Notes: pt stated he feels "like an elephant is sitting on his chest".  Pt was very rude and kept saying I just have bronchitis and need abx, it don't take a smart person to figure it out and I ain't going to the ER because I do not have a lot of gas to be wasting. Pt hung up after he was referred to ER: no care advice given.  Reason for Disposition  [1] Chest pain lasts > 5 minutes AND [2] described as crushing, pressure-like, or heavy  Answer Assessment - Initial Assessment Questions 1. LOCATION: "Where does it hurt?"       Chest pain  - all over chest  2. RADIATION: "Does the pain go anywhere else?" (e.g., into neck, jaw, arms, back)     none 3. ONSET: "When did the chest pain begin?" (Minutes, hours or days)      X 3 days 4. PATTERN: "Does the pain come and go, or has it been constant since it started?"  "Does it get worse with exertion?"      Constant  5. DURATION: "How long does it last" (e.g., seconds, minutes, hours)     N/a 6. SEVERITY: "How bad is the pain?"  (e.g., Scale 1-10; mild, moderate, or severe)    - MILD (1-3): doesn't interfere with normal activities     - MODERATE (4-7): interferes with normal activities or awakens from sleep    - SEVERE (8-10): excruciating pain, unable to do any normal activities       Moderate  5 to 6/10 pain  7. CARDIAC RISK FACTORS: "Do you have any history of heart problems or risk factors for heart disease?" (e.g., angina, prior heart attack; diabetes, high blood pressure, high cholesterol, smoker, or strong family history of heart disease)     no 8. PULMONARY RISK FACTORS: "Do you have any history of lung disease?"  (e.g., blood clots in lung, asthma, emphysema, birth control pills)     asthma 9. CAUSE: "What do you think is causing the chest pain?"     bronchitis 10. OTHER SYMPTOMS: "Do you have any other symptoms?" (e.g., dizziness, nausea, vomiting, sweating, fever, difficulty breathing, cough)       Nausea, asthma, cough, SOB at times, dizziness 11. PREGNANCY: "Is there any chance you are pregnant?" "When was your last menstrual period?"       N/a  Protocols used: Chest Pain-A-AH

## 2023-11-10 ENCOUNTER — Other Ambulatory Visit: Payer: Self-pay | Admitting: Family Medicine

## 2023-11-10 DIAGNOSIS — F411 Generalized anxiety disorder: Secondary | ICD-10-CM

## 2023-11-10 NOTE — Progress Notes (Deleted)
   Letisia Schwalb T. Jerol Rufener, MD, CAQ Sports Medicine [facility] at [facility] [address] [place], [address]  Phone: [phone]  FAX: [phone]  Joshua [name] - 33 y.o. male  MRN [id number]  Date of Birth: 1990-09-23  Date: 11/12/2023  PCP: Ziglar, Susan K, MD  Referral: Ziglar, Susan K, MD  No chief complaint on file.  Subjective:   Joshua [name] is a 33 y.o. very pleasant male patient with There is no height or weight on file to calculate BMI. who presents with the following:  Patient presents as an acute work in for evaluation of questions of prostate issues versus kidney issues.  He chronically has been a patient of Dr. Ziglar, he has seen [facility] in the past, and he also was previously seen by [facility].    Review of Systems is noted in the HPI, as appropriate  Objective:   There were no vitals taken for this visit.  GEN: No acute distress; alert,appropriate. PULM: Breathing comfortably in no respiratory distress PSYCH: Normally interactive.   Laboratory and Imaging Data:  Assessment and Plan:   ***

## 2023-11-10 NOTE — Telephone Encounter (Unsigned)
 Copied from CRM [id number]. Topic: Clinical - Medication Refill >> Nov 10, 2023 11:21 AM Higinio Roger wrote: Most Recent Primary Care Visit:  Provider: Alyson Reedy  Department: [facility]  Visit Type: ACUTE  Date: 10/20/2023  Medication: ALPRAZolam Prudy Feeler) 0.5 MG tablet  Has the patient contacted their pharmacy? Yes (Agent: If no, request that the patient contact the pharmacy for the refill. If patient does not wish to contact the pharmacy document the reason why and proceed with request.) (Agent: If yes, when and what did the pharmacy advise?)  Is this the correct pharmacy for this prescription? Yes If no, delete pharmacy and type the correct one.  This is the patient's preferred pharmacy:   [facility] [address] [place] - [address] [place] [address] Phone: [phone] Fax: [phone]  Phone: [phone] Fax: [phone]   Has the prescription been filled recently? Yes  Is the patient out of the medication? Yes  Has the patient been seen for an appointment in the last year OR does the patient have an upcoming appointment? Yes  Can we respond through MyChart? Yes  Agent: Please be advised that Rx refills may take up to 3 business days. We ask that you follow-up with your pharmacy.

## 2023-11-12 ENCOUNTER — Ambulatory Visit: Admitting: Family Medicine

## 2023-11-13 ENCOUNTER — Telehealth: Payer: Self-pay | Admitting: Family Medicine

## 2023-11-13 NOTE — Telephone Encounter (Signed)
 Patient has been seen acute in past, but never came in for New Patient appt. Advised we needed to establish care and made apt 12/04/23 with Joshua [name].  Advised he would need to keep appt so he could establish care for medication refills

## 2023-11-14 ENCOUNTER — Emergency Department

## 2023-11-14 ENCOUNTER — Other Ambulatory Visit: Payer: Self-pay

## 2023-11-14 DIAGNOSIS — R361 Hematospermia: Secondary | ICD-10-CM | POA: Insufficient documentation

## 2023-11-14 DIAGNOSIS — R0789 Other chest pain: Secondary | ICD-10-CM | POA: Insufficient documentation

## 2023-11-14 LAB — BASIC METABOLIC PANEL
Anion gap: 8 (ref 5–15)
BUN: 12 mg/dL (ref 6–20)
CO2: 27 mmol/L (ref 22–32)
Calcium: 9.3 mg/dL (ref 8.9–10.3)
Chloride: 103 mmol/L (ref 98–111)
Creatinine, Ser: 1.1 mg/dL (ref 0.61–1.24)
GFR, Estimated: >60 mL/min (ref >60)
Glucose, Bld: 98 mg/dL (ref 70–99)
Potassium: 3.9 mmol/L (ref 3.5–5.1)
Sodium: 138 mmol/L (ref 135–145)

## 2023-11-14 LAB — CBC
HCT: 43.6 % (ref 39.0–52.0)
Hemoglobin: 14.4 g/dL (ref 13.0–17.0)
MCH: 29.3 pg (ref 26.0–34.0)
MCHC: 33 g/dL (ref 30.0–36.0)
MCV: 88.8 fL (ref 80.0–100.0)
Platelets: 363 10*3/uL (ref 150–400)
RBC: 4.91 MIL/uL (ref 4.22–5.81)
RDW: 12.6 % (ref 11.5–15.5)
WBC: 11.2 10*3/uL — ABNORMAL HIGH (ref 4.0–10.5)
nRBC: 0 % (ref 0.0–0.2)

## 2023-11-14 LAB — TROPONIN I (HIGH SENSITIVITY): Troponin I (High Sensitivity): 3 ng/L (ref <18)

## 2023-11-14 NOTE — ED Notes (Signed)
Pt given labelled UA cup and instructed on use for sample

## 2023-11-14 NOTE — ED Triage Notes (Addendum)
 Pt c/o blood in semen and rectum x 3 weeks with prostate pain. Pt c/o lump on left side of neck that he states radiates into the spinal cord x6 months. 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 Pt reports chills, sweats, CP x 2 weeks Wants it noted that he's had short term memory issues since brain injury in 2019. Reports a seizure a month ago, has a history of seizures.  Pt is AOX4, NAD noted.

## 2023-11-15 ENCOUNTER — Emergency Department
Admission: EM | Admit: 2023-11-15 | Discharge: 2023-11-15 | Disposition: A | Discharge status: Home / Self Care | Attending: Emergency Medicine | Admitting: Emergency Medicine

## 2023-11-15 DIAGNOSIS — R361 Hematospermia: Secondary | ICD-10-CM

## 2023-11-15 DIAGNOSIS — R0789 Other chest pain: Secondary | ICD-10-CM | POA: Diagnosis not present

## 2023-11-15 DIAGNOSIS — R079 Chest pain, unspecified: Secondary | ICD-10-CM

## 2023-11-15 LAB — URINALYSIS, ROUTINE W REFLEX MICROSCOPIC
Bilirubin Urine: NEGATIVE
Glucose, UA: NEGATIVE mg/dL
Hgb urine dipstick: NEGATIVE
Ketones, ur: NEGATIVE mg/dL
Leukocytes,Ua: NEGATIVE
Nitrite: NEGATIVE
Protein, ur: NEGATIVE mg/dL
Specific Gravity, Urine: 1.001 — ABNORMAL LOW (ref 1.005–1.030)
pH: 6 (ref 5.0–8.0)

## 2023-11-15 LAB — CHLAMYDIA/NGC RT PCR (ARMC ONLY)
Chlamydia Tr: NOT DETECTED
N gonorrhoeae: NOT DETECTED

## 2023-11-15 LAB — HIV ANTIBODY (ROUTINE TESTING W REFLEX): HIV Screen 4th Generation wRfx: NONREACTIVE

## 2023-11-15 NOTE — ED Notes (Signed)
 Pt lying on rt side appears to be resting quietly chest rise and fall noted friend @ bs

## 2023-11-15 NOTE — ED Provider Notes (Signed)
 [facility] Provider Note    Event Date/Time   First MD Initiated Contact with Patient 11/15/23 0008     (approximate)   History   Chest Pain and Mutliple Complaints   HPI  Joshua [name] is a 33 year old male presenting to the emerged department for evaluation of chest pain.  Reports that for the past few days he has had some chest pain described as a pressure in the left side of his chest.  Denies shortness of breath.  Denies current chest pain.  Additionally notes that he saw small amount of blood in his semen recently.  Denies testicular or penile pain.  Other complaints noted in triage, but patient does not complain of any of these to me.     Physical Exam   Triage Vital Signs: ED Triage Vitals  Encounter Vitals Group     BP 11/14/23 2156 (!) 160/102     Systolic BP Percentile --      Diastolic BP Percentile --      Pulse Rate 11/14/23 2156 97     Resp 11/14/23 2156 18     Temp 11/14/23 2156 98.4 F (36.9 C)     Temp Source 11/14/23 2156 Oral     SpO2 11/14/23 2156 97 %     Weight --      Height --      Head Circumference --      Peak Flow --      Pain Score 11/14/23 2159 10     Pain Loc --      Pain Education --      Exclude from Growth Chart --     Most recent vital signs: Vitals:   11/14/23 2156  BP: (!) 160/102  Pulse: 97  Resp: 18  Temp: 98.4 F (36.9 C)  SpO2: 97%     General: Awake, interactive  CV:  Regular rate, good peripheral perfusion.  Resp:  Unlabored respirations, lungs clear to auscultation  Abd:  Nondistended, soft, nontender to palpation Neuro:  Symmetric facial movement, fluid speech   ED Results / Procedures / Treatments   Labs (all labs ordered are listed, but only abnormal results are displayed) Labs Reviewed  CBC - Abnormal; Notable for the following components:      Result Value   WBC 11.2 (*)    All other components within normal limits  URINALYSIS, ROUTINE W REFLEX MICROSCOPIC - Abnormal;  Notable for the following components:   Color, Urine COLORLESS (*)    APPearance CLEAR (*)    Specific Gravity, Urine 1.001 (*)    All other components within normal limits  CHLAMYDIA/NGC RT PCR ([facility] ONLY)            BASIC METABOLIC PANEL  RPR  HIV ANTIBODY (ROUTINE TESTING W REFLEX)  TROPONIN I (HIGH SENSITIVITY)     EKG EKG independently reviewed interpreted by myself (ER attending) demonstrates:  EKG demonstrate sinus rhythm rate of 100, PR 146, QRS of 88, QTc 397, no acute ST changes  RADIOLOGY Imaging independently reviewed and interpreted by myself demonstrates:  CXR without focal consolidation  PROCEDURES:  Critical Care performed: No  Procedures   MEDICATIONS ORDERED IN ED: Medications - No data to display   IMPRESSION / MDM / ASSESSMENT AND PLAN / ED COURSE  I reviewed the triage vital signs and the nursing notes.  Differential diagnosis includes, but is not limited to, ACS, pneumonia, pneumothorax, viral illness, low risk PE and PERC negative, UTI,  STD  Patient's presentation is most consistent with acute presentation with potential threat to life or bodily function.  33 year old male presenting with chest pain.  Stable vitals on presentation.  Labs overall reassuring.  Negative prednisone well over 3 hours of symptoms.  Low risk heart score.  X-Permelia Bamba and EKG reassuring.  Urinalysis reassuring.  STD testing pending, patient notified that he will be contacted if this returns positive.  Did think he is stable for discharge at this time.  Strict return precautions provided.     FINAL CLINICAL IMPRESSION(S) / ED DIAGNOSES   Final diagnoses:  Nonspecific chest pain  Blood in semen     Rx / DC Orders   ED Discharge Orders     None        Note:  This document was prepared using Dragon voice recognition software and may include unintentional dictation errors.   Trinna Post, MD 11/15/23 [phone]

## 2023-11-15 NOTE — Discharge Instructions (Signed)
 Your testing today was fortunately reassuring.  Follow-up with a primary care doctor for further evaluation.  Return to the ER for new or worsening symptoms.

## 2023-11-16 ENCOUNTER — Other Ambulatory Visit: Payer: Self-pay | Admitting: Family Medicine

## 2023-11-16 ENCOUNTER — Telehealth (HOSPITAL_BASED_OUTPATIENT_CLINIC_OR_DEPARTMENT_OTHER): Payer: Self-pay

## 2023-11-16 DIAGNOSIS — F411 Generalized anxiety disorder: Secondary | ICD-10-CM

## 2023-11-16 LAB — RPR: RPR Ser Ql: NONREACTIVE

## 2023-11-16 NOTE — Telephone Encounter (Signed)
 Copied from CRM [id number]. Topic: Clinical - Prescription Issue >> Nov 16, 2023 12:59 PM Victorino Dike T wrote: Reason for CRM: ALPRAZolam Prudy Feeler) 0.5 MG tablet- needs refill until he can get into new doctor

## 2023-11-16 NOTE — Telephone Encounter (Signed)
 Copied from CRM [id number]. Topic: Clinical - Medication Refill >> Nov 16, 2023 12:55 PM Shelah Lewandowsky wrote: Most Recent Primary Care Visit:  Provider: Alyson Reedy  Department: [facility]  Visit Type: ACUTE  Date: 10/20/2023  Medication: ALPRAZolam Prudy Feeler) 0.5 MG tablet  Has the patient contacted their pharmacy? Yes (Agent: If no, request that the patient contact the pharmacy for the refill. If patient does not wish to contact the pharmacy document the reason why and proceed with request.) (Agent: If yes, when and what did the pharmacy advise?)  Is this the correct pharmacy for this prescription? Yes If no, delete pharmacy and type the correct one.  This is the patient's preferred pharmacy:  [facility] [address] [place] - [address] [place] [address] Phone: [phone] Fax: [phone]     Has the prescription been filled recently? Yes  Is the patient out of the medication? Yes  Has the patient been seen for an appointment in the last year OR does the patient have an upcoming appointment? Yes  Can we respond through MyChart? Yes  Agent: Please be advised that Rx refills may take up to 3 business days. We ask that you follow-up with your pharmacy.

## 2023-11-25 ENCOUNTER — Other Ambulatory Visit: Payer: Self-pay | Admitting: Nurse Practitioner

## 2023-11-25 NOTE — Telephone Encounter (Unsigned)
 Copied from CRM [id number]. Topic: Clinical - Medication Refill >> Nov 25, 2023  5:55 PM Shereese L wrote: Most Recent Primary Care Visit:  Provider: Alyson Reedy  Department: [facility]  Visit Type: ACUTE  Date: 10/20/2023  Medication:ALPRAZolam Prudy Feeler) 0.5 MG tablet  Has the patient contacted their pharmacy? Yes (Agent: If no, request that the patient contact the pharmacy for the refill. If patient does not wish to contact the pharmacy document the reason why and proceed with request.) (Agent: If yes, when and what did the pharmacy advise?)  Is this the correct pharmacy for this prescription? Yes If no, delete pharmacy and type the correct one.  This is the patient's preferred pharmacy:  [facility] [address] [place] - [address] [place] [address] Phone: [phone] Fax: [phone]  [id number] [place], [place] - [address] [place] [address] Phone: [phone] Fax: [phone]  [id number] [place] [place], [place] - [address] [place] [address] Phone: [phone] Fax: [phone]   Has the prescription been filled recently? Yes  Is the patient out of the medication? Yes  Has the patient been seen for an appointment in the last year OR does the patient have an upcoming appointment? Yes  Can we respond through MyChart? Yes  Agent: Please be advised that Rx refills may take up to 3 business days. We ask that you follow-up with your pharmacy.

## 2023-11-26 ENCOUNTER — Other Ambulatory Visit: Payer: Self-pay | Admitting: Family Medicine

## 2023-11-26 DIAGNOSIS — F411 Generalized anxiety disorder: Secondary | ICD-10-CM

## 2023-12-04 ENCOUNTER — Encounter: Payer: Self-pay | Admitting: Nurse Practitioner

## 2023-12-04 ENCOUNTER — Ambulatory Visit (INDEPENDENT_AMBULATORY_CARE_PROVIDER_SITE_OTHER): Admitting: Nurse Practitioner

## 2023-12-04 VITALS — BP 138/80 | HR 95 | Temp 98.2°F | Ht 67.0 in | Wt 187.4 lb

## 2023-12-04 DIAGNOSIS — I10 Essential (primary) hypertension: Secondary | ICD-10-CM | POA: Diagnosis not present

## 2023-12-04 DIAGNOSIS — T879 Unspecified complications of amputation stump: Secondary | ICD-10-CM

## 2023-12-04 DIAGNOSIS — G40909 Epilepsy, unspecified, not intractable, without status epilepticus: Secondary | ICD-10-CM

## 2023-12-04 DIAGNOSIS — F1911 Other psychoactive substance abuse, in remission: Secondary | ICD-10-CM

## 2023-12-04 DIAGNOSIS — F321 Major depressive disorder, single episode, moderate: Secondary | ICD-10-CM | POA: Diagnosis not present

## 2023-12-04 DIAGNOSIS — Z720 Tobacco use: Secondary | ICD-10-CM | POA: Diagnosis not present

## 2023-12-04 DIAGNOSIS — E663 Overweight: Secondary | ICD-10-CM

## 2023-12-04 DIAGNOSIS — F411 Generalized anxiety disorder: Secondary | ICD-10-CM

## 2023-12-04 DIAGNOSIS — Z0000 Encounter for general adult medical examination without abnormal findings: Secondary | ICD-10-CM

## 2023-12-04 DIAGNOSIS — Z87898 Personal history of other specified conditions: Secondary | ICD-10-CM

## 2023-12-04 DIAGNOSIS — G8929 Other chronic pain: Secondary | ICD-10-CM

## 2023-12-04 DIAGNOSIS — Z13220 Encounter for screening for lipoid disorders: Secondary | ICD-10-CM | POA: Diagnosis not present

## 2023-12-04 DIAGNOSIS — Z131 Encounter for screening for diabetes mellitus: Secondary | ICD-10-CM

## 2023-12-04 DIAGNOSIS — M545 Low back pain, unspecified: Secondary | ICD-10-CM

## 2023-12-04 LAB — CBC
HCT: 44.7 % (ref 39.0–52.0)
Hemoglobin: 15.2 g/dL (ref 13.0–17.0)
MCHC: 34.1 g/dL (ref 30.0–36.0)
MCV: 88 fl (ref 78.0–100.0)
Platelets: 352 10*3/uL (ref 150.0–400.0)
RBC: 5.08 Mil/uL (ref 4.22–5.81)
RDW: 13.3 % (ref 11.5–15.5)
WBC: 10.8 10*3/uL — ABNORMAL HIGH (ref 4.0–10.5)

## 2023-12-04 LAB — COMPREHENSIVE METABOLIC PANEL WITH GFR
ALT: 18 U/L (ref 0–53)
AST: 16 U/L (ref 0–37)
Albumin: 4.9 g/dL (ref 3.5–5.2)
Alkaline Phosphatase: 54 U/L (ref 39–117)
BUN: 16 mg/dL (ref 6–23)
CO2: 27 meq/L (ref 19–32)
Calcium: 9.3 mg/dL (ref 8.4–10.5)
Chloride: 102 meq/L (ref 96–112)
Creatinine, Ser: 1.07 mg/dL (ref 0.40–1.50)
GFR: 90.32 mL/min (ref >60.00)
Glucose, Bld: 95 mg/dL (ref 70–99)
Potassium: 4.1 meq/L (ref 3.5–5.1)
Sodium: 137 meq/L (ref 135–145)
Total Bilirubin: 0.3 mg/dL (ref 0.2–1.2)
Total Protein: 7.8 g/dL (ref 6.0–8.3)

## 2023-12-04 LAB — LIPID PANEL
Cholesterol: 185 mg/dL (ref 0–200)
HDL: 37.7 mg/dL — ABNORMAL LOW (ref >39.00)
LDL Cholesterol: 129 mg/dL — ABNORMAL HIGH (ref 0–99)
NonHDL: 147.66
Total CHOL/HDL Ratio: 5
Triglycerides: 95 mg/dL (ref 0.0–149.0)
VLDL: 19 mg/dL (ref 0.0–40.0)

## 2023-12-04 LAB — URINALYSIS, MICROSCOPIC ONLY: RBC / HPF: NONE SEEN (ref 0–?)

## 2023-12-04 LAB — HEMOGLOBIN A1C: Hgb A1c MFr Bld: 5.5 % (ref 4.6–6.5)

## 2023-12-04 MED ORDER — ALPRAZOLAM 0.5 MG PO TABS: 0.5000 mg | ORAL_TABLET | Freq: Two times a day (BID) | ORAL | 0 refills | Status: DC | PRN

## 2023-12-04 MED ORDER — TRIAMCINOLONE ACETONIDE 55 MCG/ACT NA AERO: 2.0000 | INHALATION_SPRAY | Freq: Every day | NASAL | 12 refills | Status: DC

## 2023-12-04 NOTE — Assessment & Plan Note (Signed)
 Pending TSH, A1c, lipid panel.

## 2023-12-04 NOTE — Assessment & Plan Note (Signed)
Discussed age-appropriate immunizations and screening exams.  Did review patient's personal, surgical, social, family histories.  Patient is up-to-date on all age-appropriate vaccinations he would like.  Patient is too young for CRC screening or prostate cancer screening.  Patient was given information at discharge about preventative healthcare maintenance with anticipatory guidance.

## 2023-12-04 NOTE — Assessment & Plan Note (Signed)
 Patient currently maintained on amlodipine 5 mg lisinopril 5 mg.  Patient's blood pressure well-controlled continue with medication as prescribed.  Pending labs today

## 2023-12-04 NOTE — Assessment & Plan Note (Signed)
 Patient currently maintained on gabapentin this is being followed and filled by neurology

## 2023-12-04 NOTE — Progress Notes (Signed)
 New Patient Office Visit  Subjective    Patient ID: Joshua [name], male    DOB: [birth year]  Age: 33 y.o. MRN: [id number]  CC:  Chief Complaint  Patient presents with   Establish Care    Pt complains of need of having his thyroid levels checked. Pt states he sweats excessively.     Medication Refill    Alprazolam. Pt has been out for a couple weeks.  Nasacort     HPI Joshua [name] presents to establish care  HTN: amlodipine 5mg  and lisinopril 5mg . States that he does not check blood pressure at home   Seizure disorder: he is followed [facility] neurology. Recenlty had an EEG. He does have a tremor on the great side greater than left side    Asthma: he use the albuterol everyday. He states that he will use  MDD/GAD: history of the same he is maintained on alprazolam  History of substance use disorder: this was an intentional overdose during a hard time through his life   for complete physical and follow up of chronic conditions.  Immunizations: -Tetanus: Completed in 2017 -Influenza: refused  -Shingles: too young  -Pneumonia: too young   Diet: Fair diet. He tries to eat 2 meals a day that are smaller. He does snack. He will drink coffee in the am and juices. Some water  Exercise: No regular exercise. He does a stationary bike and treamill. 5-20 minutes a day   Eye exam: PRN Dental exam: Completes semi-annually    Colonoscopy: too young   Lung Cancer Screening: too young    PSA: too young  Sleep: goes to bed around  11-12 and will get up around 8-9. Feels rested      Outpatient Encounter Medications as of 12/04/2023  Medication Sig   albuterol (VENTOLIN HFA) 108 (90 Base) MCG/ACT inhaler Inhale 2 puffs into the lungs every 6 (six) hours as needed for wheezing or shortness of breath.   amLODipine (NORVASC) 5 MG tablet Take 1 tablet (5 mg total) by mouth daily.   divalproex (DEPAKOTE) 250 MG DR tablet Take 250 mg once daily for one week and then increase to 250 mg twice  daily for one week and then increase to 250 mg in the morning and 500 mg   fluticasone-salmeterol (ADVAIR HFA) 45-21 MCG/ACT inhaler Inhale 2 puffs into the lungs 2 (two) times daily.   gabapentin (NEURONTIN) 300 MG capsule Take 1 capsule (300 mg total) by mouth 3 (three) times daily. (Patient taking differently: Take 600 mg by mouth 4 (four) times daily.)   lisinopril (ZESTRIL) 5 MG tablet Take 1 tablet (5 mg total) by mouth daily.   loratadine (CLARITIN) 10 MG tablet Take 1 tablet (10 mg total) by mouth daily.   Misc. Devices MISC Right leg Prosthetic. Dx: Right AKA   nortriptyline (PAMELOR) 50 MG capsule Take 1 capsule by mouth at bedtime.   [DISCONTINUED] ALPRAZolam (XANAX) 0.5 MG tablet Take 1 tablet (0.5 mg total) by mouth 2 (two) times daily as needed for anxiety.   [DISCONTINUED] lisinopril (ZESTRIL) 5 MG tablet Take 1 tablet (5 mg total) by mouth daily.   [DISCONTINUED] nortriptyline (PAMELOR) 10 MG capsule Take 30 mg nightly for 1 week, then increase to 40 mg nightly   [DISCONTINUED] triamcinolone (NASACORT) 55 MCG/ACT AERO nasal inhaler Place 2 sprays into the nose daily.   ALPRAZolam (XANAX) 0.5 MG tablet Take 1 tablet (0.5 mg total) by mouth 2 (two) times daily as needed for  anxiety.   triamcinolone (NASACORT) 55 MCG/ACT AERO nasal inhaler Place 2 sprays into the nose daily.   [DISCONTINUED] cyclobenzaprine (FLEXERIL) 10 MG tablet Take 1 tablet (10 mg total) by mouth 3 (three) times daily as needed. (Patient not taking: Reported on 09/25/2023)   [DISCONTINUED] hydrocortisone 1 % ointment Apply 1 application topically 2 (two) times daily. (Patient not taking: Reported on 12/04/2023)   [DISCONTINUED] lamoTRIgine (LAMICTAL) 100 MG tablet Take 100 mg by mouth 2 (two) times daily. (Patient not taking: Reported on 12/04/2023)   [DISCONTINUED] methocarbamol (ROBAXIN) 500 MG tablet Take 1 tablet (500 mg total) by mouth 4 (four) times daily.   [DISCONTINUED] tiZANidine (ZANAFLEX) 4 MG tablet Take 4  mg by mouth 3 (three) times daily. (Patient not taking: Reported on 12/04/2023)   No facility-administered encounter medications on file as of 12/04/2023.    Past Medical History:  Diagnosis Date   Acute renal failure (ARF) (HCC)    History of    Aspiration pneumonia (HCC)    Hx of   Asthma    Bronchitis    Cardiac arrest (HCC)    hx of   Cardiogenic shock (HCC)    hx of   Drug overdose    Hernia cerebri (HCC)    History of acute respiratory failure    Hypertension    Neuropathy    Polysubstance abuse (HCC)    Rhabdomyolysis    Seizures (HCC)     Past Surgical History:  Procedure Laterality Date   AMPUTATION Right 11/03/2017   Procedure: KNEE DISARTICULATION;  Surgeon: Renford Dills, MD;  Location: [facility];  Service: Vascular;  Laterality: Right;   AMPUTATION Right 11/10/2017   Procedure: AMPUTATION ABOVE KNEE;  Surgeon: Renford Dills, MD;  Location: [facility];  Service: Vascular;  Laterality: Right;   ankle/foot surgery with metal plates     CERVICAL SPINE SURGERY     CONDYLOMA EXCISION/FULGURATION N/A 03/19/2021   Procedure: CONDYLOMA REMOVAL WITH Co2 LASER;  Surgeon: Riki Altes, MD;  Location: [facility];  Service: Urology;  Laterality: N/A;   DIALYSIS/PERMA CATHETER INSERTION N/A 11/16/2017   Procedure: DIALYSIS/PERMA CATHETER INSERTION;  Surgeon: Annice Needy, MD;  Location: [facility];  Service: Cardiovascular;  Laterality: N/A;   DIALYSIS/PERMA CATHETER REMOVAL N/A 12/14/2017   Procedure: DIALYSIS/PERMA CATHETER REMOVAL;  Surgeon: Annice Needy, MD;  Location: [facility];  Service: Cardiovascular;  Laterality: N/A;   permacath to right side     Scheduled to be removed    Family History  Problem Relation Age of Onset   Depression Mother    Bipolar disorder Mother    Early death Father        Motorcycle wreck   Cancer Maternal Grandfather        leukemia   Varicose Veins Neg Hx     Social History   Socioeconomic History    Marital status: Married    Spouse name: nohemi   Number of children: 0   Years of education: Not on file   Highest education level: 11th grade  Occupational History   Not on file  Tobacco Use   Smoking status: Every Day    Average packs/day: 0.5 packs/day for 16.0 years (8.0 ttl pk-yrs)    Types: Cigarettes    Start date: 12/04/2003    Passive exposure: Current   Smokeless tobacco: Never  Vaping Use   Vaping status: Never Used  Substance and Sexual Activity   Alcohol use: No  Drug use: Not Currently    Types: Cocaine, Methamphetamines    Comment: states "none since last time when surgery was canceled"   Sexual activity: Not on file  Other Topics Concern   Not on file  Social History Narrative   Pt is right handed   Lives in single story home with his mother   11th grade education   Last employment was at [employer] in [place]   Right leg amputated   Social Drivers of Health   Financial Resource Strain: High Risk (07/29/2023)   [facility]   Overall Financial Resource Strain (CARDIA)    Difficulty of Paying Living Expenses: Hard  Food Insecurity: Food Insecurity Present (07/29/2023)   Received from [facility]   Hunger Vital Sign    Worried About Running Out of Food in the Last Year: Sometimes true    Ran Out of Food in the Last Year: Sometimes true  Transportation Needs: Unmet Transportation Needs (07/29/2023)   [facility]   PRAPARE - Transportation    In the past 12 months, has lack of transportation kept you from medical appointments or from getting medications?: Yes    Lack of Transportation (Non-Medical): Yes  Physical Activity: Not on file  Stress: Not on file  Social Connections: Unknown (02/20/2022)   [facility]   Social Network    Social Network: Not on file  Intimate Partner Violence: Not At Risk (11/17/2023)   [facility]    Humiliation, Afraid, Rape, and Kick questionnaire    Fear of Current or Ex-Partner: No    Emotionally Abused: No    Physically Abused: No    Sexually Abused: No    Review of Systems  Constitutional:  Positive for chills. Negative for fever.  Respiratory:  Negative for shortness of breath.   Cardiovascular:  Negative for chest pain and leg swelling.  Gastrointestinal:  Negative for abdominal pain, blood in stool, constipation, diarrhea, nausea and vomiting.       Bm every other day   Genitourinary:  Negative for dysuria and hematuria.  Neurological:  Positive for tremors. Negative for tingling and headaches.  Psychiatric/Behavioral:  Positive for memory loss. Negative for hallucinations and suicidal ideas.         Objective    BP 138/80   Pulse 95   Temp 98.2 F (36.8 C) (Oral)   Ht 5\' 7"  (1.702 m)   Wt 187 lb 6.4 oz (85 kg)   SpO2 97%   BMI 29.35 kg/m   Physical Exam Vitals and nursing note reviewed.  Constitutional:      Appearance: Normal appearance.  HENT:     Right Ear: Tympanic membrane, ear canal and external ear normal.     Left Ear: Tympanic membrane, ear canal and external ear normal.     Mouth/Throat:     Mouth: Mucous membranes are moist.     Pharynx: Oropharynx is clear.  Eyes:     Extraocular Movements: Extraocular movements intact.     Pupils: Pupils are equal, round, and reactive to light.  Cardiovascular:     Rate and Rhythm: Normal rate and regular rhythm.     Pulses:          Dorsalis pedis pulses are 0 on the right side.       Posterior tibial pulses are 0 on the right side.     Heart sounds: Normal heart  sounds.     Comments: R AKA Pulmonary:     Effort: Pulmonary effort is normal.     Breath sounds: Normal breath sounds.  Abdominal:     General: Bowel sounds are normal. There is no distension.     Palpations: There is no mass.     Tenderness: There is no abdominal tenderness.     Hernia: No hernia is present.  Genitourinary:     Comments: deferred Musculoskeletal:     Right lower leg: No edema.     Left lower leg: No edema.  Lymphadenopathy:     Cervical: No cervical adenopathy.  Skin:    General: Skin is warm.  Neurological:     General: No focal deficit present.     Mental Status: He is alert. Mental status is at baseline.     Deep Tendon Reflexes:     Reflex Scores:      Bicep reflexes are 2+ on the right side and 2+ on the left side.      Patellar reflexes are 0 on the right side and 2+ on the left side.    Comments: Bilateral upper and lower extremity strength 5/5  R AKA  Psychiatric:        Mood and Affect: Mood normal.        Behavior: Behavior normal.        Thought Content: Thought content normal.        Judgment: Judgment normal.         Assessment & Plan:   Problem List Items Addressed This Visit       Cardiovascular and Mediastinum   Hypertension - Primary   Patient currently maintained on amlodipine 5 mg lisinopril 5 mg.  Patient's blood pressure well-controlled continue with medication as prescribed.  Pending labs today      Relevant Orders   CBC   Comprehensive metabolic panel with GFR     Nervous and Auditory   Seizure disorder (HCC)   History of the same.  Patient is followed by neurology Dr. Theora Master.  Patient is currently maintained onDepakote and nortriptyline.      Relevant Medications   divalproex (DEPAKOTE) 250 MG DR tablet     Other   MDD (major depressive disorder), single episode, moderate (HCC)   History of the same.  Patient currently just maintained on alprazolam 0.5 twice daily as needed.  Patient denies HI/SI/AVH      Relevant Medications   nortriptyline (PAMELOR) 50 MG capsule   ALPRAZolam (XANAX) 0.5 MG tablet   GAD (generalized anxiety disorder)   Relevant Medications   nortriptyline (PAMELOR) 50 MG capsule   ALPRAZolam (XANAX) 0.5 MG tablet   History of seizures   Preventative health care   Discussed age-appropriate immunizations and  screening exams.  Did review patient's personal, surgical, social, family histories.  Patient is up-to-date on all age-appropriate vaccinations he would like.  Patient is too young for CRC screening or prostate cancer screening.  Patient was given information at discharge about preventative healthcare maintenance with anticipatory guidance.      AKA stump complication (HCC)   Patient currently maintained on gabapentin this is being followed and filled by neurology      Chronic bilateral low back pain without sciatica   Relevant Medications   nortriptyline (PAMELOR) 50 MG capsule   divalproex (DEPAKOTE) 250 MG DR tablet   Tobacco user   History of the same.  Continue microscopy rule out microscopic hematuria  Relevant Orders   Urine Microscopic   Overweight   Pending TSH, A1c, lipid panel.      History of substance abuse (HCC)   Given history of substance abuse and patient being on controlled substance we will do a urine drug screen in office.  Pending results      Relevant Orders   DRUG MONITORING, PANEL 8 WITH CONFIRMATION, URINE   Other Visit Diagnoses       Screening for lipid disorders       Relevant Orders   Lipid panel     Screening for diabetes mellitus       Relevant Orders   Hemoglobin A1c       Return in about 6 months (around 06/04/2024) for MDD/GAD recheck .   Audria Nine, NP

## 2023-12-04 NOTE — Assessment & Plan Note (Signed)
 Given history of substance abuse and patient being on controlled substance we will do a urine drug screen in office.  Pending results

## 2023-12-04 NOTE — Assessment & Plan Note (Addendum)
 History of the same.  Patient is followed by neurology Dr. Theora Master.  Patient is currently maintained onDepakote and nortriptyline.

## 2023-12-04 NOTE — Assessment & Plan Note (Signed)
 History of the same.  Continue microscopy rule out microscopic hematuria

## 2023-12-04 NOTE — Assessment & Plan Note (Signed)
 History of the same.  Patient currently just maintained on alprazolam 0.5 twice daily as needed.  Patient denies HI/SI/AVH

## 2023-12-04 NOTE — Patient Instructions (Signed)
 Nice to see you today I will be in touch with the labs  I want to see you in 6 months, sooner if you need me

## 2023-12-07 LAB — DRUG MONITORING, PANEL 8 WITH CONFIRMATION, URINE
6 Acetylmorphine: NEGATIVE ng/mL (ref <10)
Alcohol Metabolites: NEGATIVE ng/mL (ref <500)
Alphahydroxyalprazolam: 125 ng/mL — ABNORMAL HIGH (ref <25)
Alphahydroxymidazolam: NEGATIVE ng/mL (ref <50)
Alphahydroxytriazolam: NEGATIVE ng/mL (ref <50)
Aminoclonazepam: NEGATIVE ng/mL (ref <25)
Amphetamine: 13492 ng/mL — ABNORMAL HIGH (ref <250)
Amphetamines: POSITIVE ng/mL — AB (ref <500)
Benzodiazepines: POSITIVE ng/mL — AB (ref <100)
Buprenorphine, Urine: NEGATIVE ng/mL (ref <5)
Cocaine Metabolite: NEGATIVE ng/mL (ref <150)
Creatinine: 133.7 mg/dL (ref >=20.0)
Hydroxyethylflurazepam: NEGATIVE ng/mL (ref <50)
Lorazepam: NEGATIVE ng/mL (ref <50)
MDMA: NEGATIVE ng/mL (ref <500)
Marijuana Metabolite: NEGATIVE ng/mL (ref <20)
Methamphetamine: >15000 ng/mL — ABNORMAL HIGH (ref <250)
Nordiazepam: NEGATIVE ng/mL (ref <50)
Opiates: NEGATIVE ng/mL (ref <100)
Oxazepam: NEGATIVE ng/mL (ref <50)
Oxidant: NEGATIVE ug/mL (ref <200)
Oxycodone: NEGATIVE ng/mL (ref <100)
Temazepam: NEGATIVE ng/mL (ref <50)
pH: 6 (ref 4.5–9.0)

## 2023-12-07 LAB — DM TEMPLATE

## 2023-12-08 ENCOUNTER — Encounter: Payer: Self-pay | Admitting: Nurse Practitioner

## 2023-12-10 ENCOUNTER — Telehealth: Payer: Self-pay | Admitting: Nurse Practitioner

## 2023-12-10 NOTE — Telephone Encounter (Signed)
 Called pt. No answer and Unable to leave voicemail.

## 2023-12-10 NOTE — Telephone Encounter (Signed)
-----   Message from [facility] T sent at 12/09/2023  8:39 AM EDT ----- Added from CRM   Reason for CRM: Patient called in regarding lab results. I relayed message to him from provider.He stated that albuterol does have methamphetamine it was checked? es in it and he stated that he take albuterol all day every day to help him breathe. He also asked about his thyroid,and was it checked?He stated that he has been sweating really bad.He can just be sitting down and began to sweat.

## 2023-12-10 NOTE — Telephone Encounter (Signed)
 Added from CRM:   CommunicationPatient/patient representative is calling to schedule an appointment. Refer to attachments for appointment information.Patient returning a call to the office and I told him that it was regarding labs and the doctor sent a mychart message. I read the message to the patient and scheduled him lab appointment

## 2023-12-10 NOTE — Telephone Encounter (Signed)
 Albuterol does not have methamphetamine in it. I missed checking his thyroid. That was my mistake. We can set up a lab visit just for that if he would like

## 2023-12-14 ENCOUNTER — Other Ambulatory Visit

## 2023-12-16 ENCOUNTER — Ambulatory Visit: Payer: Self-pay

## 2023-12-16 NOTE — Telephone Encounter (Signed)
 Noted agree with patient needing to be evaluated for a prescription

## 2023-12-16 NOTE — Telephone Encounter (Signed)
 Copied from CRM [id number]. Topic: Clinical - Red Word Triage >> Dec 16, 2023  2:38 PM Jethro Morrison wrote: Red Word that prompted transfer to Nurse Triage: PT STATED HE IS IN PAIN, STATED HEADACHE , AND BLOWING OUT DARK GREEN MUCUS.  Chief Complaint: cough - productive Symptoms: productive cough w/dark green mucous,10/10 headache across forehead, SOB, 10/10 body aches Frequency: x couple days ago and worsening Pertinent Negatives: Patient denies fever Disposition: [] ED /[] Urgent Care (no appt availability in office) / [x] Appointment(In office/virtual)/ []  [facility]/ [] Home Care/ [] Refused Recommended Disposition /[]  Mobile Bus/ []  Follow-up with PCP Additional Notes: pt called in today to demand a Rx for a sinus infection be sent to pharmacy for him: informed pt that he would need to be seen by a provider for evaluation and tx: pt was a little resistant about scheduling an appt & expressed who I should tell and/or demand the doctor write a Rx as he used choice curse words- therefore I providing guidelines for us  for the duration of the call.  Eventually pt agreed to schedule an appt  Reason for Disposition  SEVERE coughing spells (e.g., whooping sound after coughing, vomiting after coughing)  Answer Assessment - Initial Assessment Questions 1. ONSET: "When did the cough begin?"      X couple days ago 2. SEVERITY: "How bad is the cough today?"      severe 3. SPUTUM: "Describe the color of your sputum" (none, dry cough; clear, white, yellow, green)     Dark green mucus 4. HEMOPTYSIS: "Are you coughing up any blood?" If so ask: "How much?" (flecks, streaks, tablespoons, etc.)     N/a 5. DIFFICULTY BREATHING: "Are you having difficulty breathing?" If Yes, ask: "How bad is it?" (e.g., mild, moderate, severe)    - MILD: No SOB at rest, mild SOB with walking, speaks normally in sentences, can lie down, no retractions, pulse < 100.    - MODERATE: SOB at rest, SOB with minimal  exertion and prefers to sit, cannot lie down flat, speaks in phrases, mild retractions, audible wheezing, pulse 100-120.    - SEVERE: Very SOB at rest, speaks in single words, struggling to breathe, sitting hunched forward, retractions, pulse > 120      Mild to moderate 6. FEVER: "Do you have a fever?" If Yes, ask: "What is your temperature, how was it measured, and when did it start?"     no 7. CARDIAC HISTORY: "Do you have any history of heart disease?" (e.g., heart attack, congestive heart failure)  N/a 8. LUNG HISTORY: "Do you have any history of lung disease?"  (e.g., pulmonary embolus, asthma, emphysema)    Lung issues, asthma,  9. PE RISK FACTORS: "Do you have a history of blood clots?" (or: recent major surgery, recent prolonged travel, bedridden)     N /a 10. OTHER SYMPTOMS: "Do you have any other symptoms?" (e.g., runny nose, wheezing, chest pain)       10/10 body aches, 10/10 headache - a lot of pain in back of head 11. PREGNANCY: "Is there any chance you are pregnant?" "When was your last menstrual period?"       N/a 12. TRAVEL: "Have you traveled out of the country in the last month?" (e.g., travel history, exposures)       N/a  Protocols used: Cough - Acute Productive-A-AH

## 2023-12-16 NOTE — Telephone Encounter (Signed)
 I will check him at the appointment

## 2023-12-17 ENCOUNTER — Ambulatory Visit: Admitting: Internal Medicine

## 2023-12-18 ENCOUNTER — Telehealth: Payer: Self-pay | Admitting: Nurse Practitioner

## 2023-12-18 ENCOUNTER — Ambulatory Visit: Admitting: Family

## 2023-12-18 NOTE — Telephone Encounter (Signed)
 Documented as FYI only: Patient walked into office requesting an antibiotic. We informed the patient that he missed his appointment this morning. Patient stated "can't the provider come out and look at me for two minutes, there is something green coming out of my nose".  We informed the patient that there were no more appointments this afternoon  Patient left the office visibly upset that he could not be seen

## 2023-12-19 ENCOUNTER — Other Ambulatory Visit: Payer: Self-pay

## 2023-12-19 ENCOUNTER — Emergency Department
Admission: EM | Admit: 2023-12-19 | Discharge: 2023-12-19 | Disposition: A | Discharge status: Home / Self Care | Attending: Emergency Medicine | Admitting: Emergency Medicine

## 2023-12-19 DIAGNOSIS — S59912A Unspecified injury of left forearm, initial encounter: Secondary | ICD-10-CM | POA: Diagnosis present

## 2023-12-19 DIAGNOSIS — T22131A Burn of first degree of right upper arm, initial encounter: Secondary | ICD-10-CM | POA: Insufficient documentation

## 2023-12-19 DIAGNOSIS — T22212A Burn of second degree of left forearm, initial encounter: Secondary | ICD-10-CM | POA: Diagnosis not present

## 2023-12-19 DIAGNOSIS — T22191A Burn of first degree of multiple sites of right shoulder and upper limb, except wrist and hand, initial encounter: Secondary | ICD-10-CM

## 2023-12-19 DIAGNOSIS — T2010XA Burn of first degree of head, face, and neck, unspecified site, initial encounter: Secondary | ICD-10-CM | POA: Diagnosis not present

## 2023-12-19 DIAGNOSIS — X17XXXA Contact with hot engines, machinery and tools, initial encounter: Secondary | ICD-10-CM | POA: Diagnosis not present

## 2023-12-19 DIAGNOSIS — Y9389 Activity, other specified: Secondary | ICD-10-CM | POA: Insufficient documentation

## 2023-12-19 DIAGNOSIS — T22132A Burn of first degree of left upper arm, initial encounter: Secondary | ICD-10-CM | POA: Insufficient documentation

## 2023-12-19 DIAGNOSIS — T2220XA Burn of second degree of shoulder and upper limb, except wrist and hand, unspecified site, initial encounter: Secondary | ICD-10-CM

## 2023-12-19 MED ORDER — MORPHINE SULFATE (PF) 4 MG/ML IV SOLN
4.0000 mg | Freq: Once | INTRAVENOUS | Status: AC
  Administered 2023-12-19: 4 mg via INTRAVENOUS
  Filled 2023-12-19: qty 1

## 2023-12-19 MED ORDER — OXYCODONE-ACETAMINOPHEN 5-325 MG PO TABS: 1.0000 | ORAL_TABLET | ORAL | 0 refills | Status: DC | PRN

## 2023-12-19 MED ORDER — ONDANSETRON HCL 4 MG/2ML IJ SOLN
4.0000 mg | Freq: Once | INTRAMUSCULAR | Status: AC
  Administered 2023-12-19: 4 mg via INTRAVENOUS
  Filled 2023-12-19: qty 2

## 2023-12-19 MED ORDER — OXYCODONE-ACETAMINOPHEN 5-325 MG PO TABS
2.0000 | ORAL_TABLET | Freq: Once | ORAL | Status: AC
  Administered 2023-12-19: 2 via ORAL
  Filled 2023-12-19: qty 2

## 2023-12-19 MED ORDER — BACITRACIN ZINC 500 UNIT/GM EX OINT
TOPICAL_OINTMENT | Freq: Once | CUTANEOUS | Status: AC
  Filled 2023-12-19: qty 4.5
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
## 2023-12-21 ENCOUNTER — Other Ambulatory Visit: Payer: Self-pay | Admitting: Nurse Practitioner

## 2023-12-21 DIAGNOSIS — F411 Generalized anxiety disorder: Secondary | ICD-10-CM

## 2023-12-23 ENCOUNTER — Other Ambulatory Visit: Payer: Self-pay | Admitting: Nurse Practitioner

## 2023-12-23 DIAGNOSIS — F411 Generalized anxiety disorder: Secondary | ICD-10-CM

## 2023-12-23 NOTE — Telephone Encounter (Signed)
 Copied from CRM [id number]. Topic: Clinical - Medication Refill >> Dec 23, 2023  9:31 AM Artemio Larry wrote: Most Recent Primary Care Visit:  Provider: Dorothe Gaster  Department: [facility]  Visit Type: NEW PT - OFFICE VISIT  Date: 12/04/2023  Medication: ALPRAZolam    Has the patient contacted their pharmacy? Yes (Agent: If no, request that the patient contact the pharmacy for the refill. If patient does not wish to contact the pharmacy document the reason why and proceed with request.) (Agent: If yes, when and what did the pharmacy advise?)  Is this the correct pharmacy for this prescription? Yes If no, delete pharmacy and type the correct one.  This is the patient's preferred pharmacy:   [facility] [address] [place] - [address] [place] [address] Phone: [phone] Fax: [phone]  Has the prescription been filled recently? Yes  Is the patient out of the medication? Yes  Has the patient been seen for an appointment in the last year OR does the patient have an upcoming appointment? Yes  Can we respond through MyChart? No  Agent: Please be advised that Rx refills may take up to 3 business days. We ask that you follow-up with your pharmacy.

## 2023-12-25 ENCOUNTER — Ambulatory Visit: Payer: Self-pay

## 2023-12-25 NOTE — Telephone Encounter (Signed)
 Pt states he is out of Xanax  as he only received 30 tabs for 30 days, pt notes prescription is written for twice daily. This RN advised as it is PRN it may have been written this way. Pt requesting 60 tabs per 30 days. Routing to provider for review.  Copied from CRM [id number]. Topic: Clinical - Prescription Issue >> Dec 25, 2023  2:13 PM Kita Perish H wrote: Reason for CRM: Patient called in upset using foul language checking the status of his refill request for his ALPRAZolam  (XANAX ) 0.5 MG tablet, patient states he is out and needs it before something bad happens upset stating it's taking too long, please reach out to patient.  Grigoriy [phone] Reason for Disposition  Caller requesting a CONTROLLED substance prescription refill (e.g., narcotics, ADHD medicines)  Answer Assessment - Initial Assessment Questions 1. DRUG NAME: "What medicine do you need to have refilled?"     Xanax  2. REFILLS REMAINING: "How many refills are remaining?" (Note: The label on the medicine or pill bottle will show how many refills are remaining. If there are no refills remaining, then a renewal may be needed.)     0 4. PRESCRIBING HCP: "Who prescribed it?" Reason: If prescribed by specialist, call should be referred to that group.     Winthrop Hawks, NP  Protocols used: Medication Refill and Renewal Call-A-AH

## 2023-12-28 NOTE — Telephone Encounter (Signed)
 I did a drug test on patient and it came back with illicti substances I informed him at that time that I will no longer be prescribing controlled substances for him

## 2023-12-28 NOTE — Telephone Encounter (Signed)
 Called pt. No answer and Unable to leave voicemail.   Sent mychart message to patient.

## 2023-12-29 ENCOUNTER — Telehealth: Payer: Self-pay

## 2023-12-29 NOTE — Telephone Encounter (Signed)
 Copied from CRM [id number]. Topic: Clinical - Prescription Issue >> Dec 29, 2023  4:06 PM Luane Rumps D wrote: Reason for CRM: Patient calling because his ALPRAZolam  (XANAX ) 0.5 MG tablet had not yet been refilled. Notified patient per MyChart message from CMA, that his xanax  will not be refilled due to the positive drug screen for illicit substances. Patient said that it was positive because he took previously prescribed adderall and is wondering if there's a way to go about getting the xanax  re-prescribed. He stated that his test will come back clean, told patient it's up to his PCP's discretion but he would like a follow-up on this question via MyChart.

## 2023-12-30 ENCOUNTER — Encounter: Payer: Self-pay | Admitting: Nurse Practitioner

## 2023-12-30 NOTE — Telephone Encounter (Signed)
 See my chart message

## 2024-01-04 ENCOUNTER — Other Ambulatory Visit: Payer: Self-pay | Admitting: Nurse Practitioner

## 2024-01-04 ENCOUNTER — Telehealth: Payer: Self-pay | Admitting: *Deleted

## 2024-01-04 DIAGNOSIS — F411 Generalized anxiety disorder: Secondary | ICD-10-CM

## 2024-01-04 MED ORDER — ALPRAZOLAM 0.5 MG PO TABS: ORAL_TABLET | ORAL | 0 refills | Status: AC

## 2024-01-04 NOTE — Telephone Encounter (Signed)
 Called and talked to patient. He first told me he had been a week out of his alprazolam  for a week. When I mentioned that he was out of the withdrawal period he told me that he was taking a friends medication He mentions that he is established with neurology and had a seizure last weekend. He has not told the neurologist nor got evaluated. Told him that he needs to let them know and if he is having seizures that he needs to be evaluated.   He offered to do a repeat drug screen as he had done adderall that he had around that use to be prescribed to him. I explained that medication is not on his list and if we as his healthcare providers are not aware of his medications that it could cause harm or unintentional overdose.   I advised him I would write a tapering off prescription of the alprazolam  medication and I would not refill it again  I also questioned how often he was taking the medication and he said twice a day. He should have been out of the medication longer than a week.   I no longer feel comfortable writing him controlled substances and no longer will. I told him that several times on the phone and also he has been notified twice before this that I would no longer write his alprazolam 

## 2024-01-04 NOTE — Telephone Encounter (Unsigned)
 Copied from CRM [id number]. Topic: Clinical - Medication Refill >> Jan 04, 2024  5:03 PM Shereese L wrote: Medication: albuterol  (VENTOLIN  HFA) 108 (90 Base) MCG/ACT inhaler fluticasone-salmeterol (ADVAIR  HFA) 45-21 MCG/ACT inhaler  Has the patient contacted their pharmacy? No (Agent: If no, request that the patient contact the pharmacy for the refill. If patient does not wish to contact the pharmacy document the reason why and proceed with request.) (Agent: If yes, when and what did the pharmacy advise?)  This is the patient's preferred pharmacy:  [facility] [address] [place] - [address] [place] [address] Phone: [phone] Fax: [phone]  [id number] [place] [place] - [address] [place] [address] Phone: [phone] Fax: [phone]  [id number], [place] - [address] [place] [address] Phone: [phone] Fax: [phone]  Is this the correct pharmacy for this prescription? Yes If no, delete pharmacy and type the correct one.   Has the prescription been filled recently? No  Is the patient out of the medication? Yes  Has the patient been seen for an appointment in the last year OR does the patient have an upcoming appointment? Yes  Can we respond through MyChart? Yes  Agent: Please be advised that Rx refills may take up to 3 business days. We ask that you follow-up with your pharmacy.

## 2024-01-04 NOTE — Telephone Encounter (Signed)
 Copied from CRM [id number]. Topic: Clinical - Medication Question >> Jan 04, 2024  3:17 PM Taleah C wrote: Reason for CRM: Patient called requesting an appt for medication refills. I informed him that Matt's next available appt Is on May 21st . He then clarified that the medication in question is alprazolam  and expressed  urgency, stating he recently had a seizure due to not having his medication and is currently experiencing related symptoms.  The patient declined to schedule the appt and also refused to be transferred to a triage nurse. After reviewing his chart, I asked if he received the message from matt on 05/07 regarding the alprazolam . Before I could relay the contents of the message, he stated that he has not read it, as he assumed Jolan Natal had denied the refill request. He also mentioned that he hasn't taken any other drugs or substances and believes his albuterol  may have caused a false positive in his test results.   He requested that I send a message to the clinic on his behalf. Additionally, he asked em to relay that if neither matt or another provider will refill the med, he intends to pursue legal action against matt. He requested a call back at [phone].

## 2024-01-05 ENCOUNTER — Other Ambulatory Visit: Payer: Self-pay | Admitting: Nurse Practitioner

## 2024-01-05 DIAGNOSIS — F411 Generalized anxiety disorder: Secondary | ICD-10-CM

## 2024-01-05 NOTE — Progress Notes (Signed)
 Orders only

## 2024-01-06 ENCOUNTER — Other Ambulatory Visit: Payer: Self-pay | Admitting: Nurse Practitioner

## 2024-01-07 ENCOUNTER — Encounter: Payer: Self-pay | Admitting: Family Medicine

## 2024-01-07 ENCOUNTER — Ambulatory Visit (INDEPENDENT_AMBULATORY_CARE_PROVIDER_SITE_OTHER): Admitting: Family Medicine

## 2024-01-07 ENCOUNTER — Other Ambulatory Visit: Payer: Self-pay | Admitting: Nurse Practitioner

## 2024-01-07 VITALS — BP 140/84 | HR 99 | Temp 98.9°F | Ht 68.0 in | Wt 200.1 lb

## 2024-01-07 DIAGNOSIS — J301 Allergic rhinitis due to pollen: Secondary | ICD-10-CM

## 2024-01-07 DIAGNOSIS — Z89611 Acquired absence of right leg above knee: Secondary | ICD-10-CM | POA: Diagnosis not present

## 2024-01-07 MED ORDER — TRIAMCINOLONE ACETONIDE 55 MCG/ACT NA AERO: 2.0000 | INHALATION_SPRAY | Freq: Every day | NASAL | 1 refills | Status: DC

## 2024-01-07 MED ORDER — LEVOCETIRIZINE DIHYDROCHLORIDE 5 MG PO TABS: 5.0000 mg | ORAL_TABLET | Freq: Every evening | ORAL | 1 refills | Status: DC

## 2024-01-07 NOTE — Progress Notes (Addendum)
 Samani Deal T. Kaylah Chiasson, MD, CAQ Sports Medicine [facility] [address] [place], [address]  Phone: [phone]  FAX: [phone]  Joshua [name] - 33 y.o. male  MRN [id number]  Date of Birth: 03/24/1991  Date: 01/07/2024  PCP: Wendee Lynwood HERO, NP  Referral: Wendee Lynwood HERO, NP  Chief Complaint  Patient presents with   Fall    10 x a day   Allergies    Asking for Rx for sinuses that insurance will cover   Subjective:   Joshua [name] is a 33 y.o. very pleasant male patient with Body mass index is 30.43 kg/m. who presents with the following:  Patient presents for evaluation for additional prosthetic prescription for his right-sided above-knee amputation.  He is current prosthetic does not fit, is causing him discomfort and is not working all that well.  He is repeatedly falling right now and he is also traumatized the prosthetic.  He does have some pain in his AKA stump.  He thinks a lot of this is due to the prosthetic no longer fitting.  He also has some significant allergies and requests prescription management for allergy symptoms.  He brings up additional symptoms.  These include seizure-like activity, tremor, which she is managed by neurology regularly.  Looks like he has also seen neurosurgery, pain management, and other community physicians.  Review of Systems is noted in the HPI, as appropriate  Objective:   BP (!) 140/84   Pulse 99   Temp 98.9 F (37.2 C) (Temporal)   Ht 5' 8 (1.727 m)   Wt 200 lb 2 oz (90.8 kg)   SpO2 95%   BMI 30.43 kg/m   GEN: No acute distress; alert,appropriate. PULM: Breathing comfortably in no respiratory distress PSYCH: Normally interactive.  Tangential, requires redirection.  Right-sided knee above-knee amputation  Laboratory and Imaging Data:  Assessment and Plan:     ICD-10-CM   1. S/P AKA (above knee amputation), right (HCC)  Z89.611     2. Seasonal allergic rhinitis due to pollen   J30.1      I wrote the patient a prescription for a specific type of prosthetic device to the [facility].  We confirmed the type of device verbally with the [facility] while he was in the office.  Chronic allergies.  It is probably reasonable to see if his insurance will cover Xyzal  and Nasacort .  He requested additional refill request sent to his primary care doctor.  DME prosthetic need: Joshua [name] is a right transfemoral amputee  He does not have any comorbidities that will impact his mobility or his ability to function with a prosthesis  Joshua [name] current microprocessor knee is broken and not functioning in his foot is worn out, he will benefit from a replacement microprocessor knee and K3 foot as none microprocessor knees cause marked to fall frequently.  Joshua [name] is a high K3 level ambulator that spends a lot of time walking around on uneven terrain, over obstacles, up and down stairs and ladders, and often must walk backwards or take sidesteps.  Medication Management during today's office visit: Meds ordered this encounter  Medications   levocetirizine (XYZAL ) 5 MG tablet    Sig: Take 1 tablet (5 mg total) by mouth every evening.    Dispense:  90 tablet    Refill:  1   triamcinolone  (NASACORT ) 55 MCG/ACT AERO nasal inhaler    Sig: Place 2 sprays into the nose daily.  Dispense:  3 each    Refill:  1   Medications Discontinued During This Encounter  Medication Reason   gabapentin  (NEURONTIN ) 300 MG capsule Dose change   loratadine  (CLARITIN ) 10 MG tablet    triamcinolone  (NASACORT ) 55 MCG/ACT AERO nasal inhaler Reorder    Orders placed today for conditions managed today: No orders of the defined types were placed in this encounter.   Disposition: No follow-ups on file.  [employer] One speech-to-text software was used for transcription in this dictation.  Possible transcriptional errors can occur using Animal nutritionist.   Signed,  Joshua [name]. Bertrice Leder,  MD   Outpatient Encounter Medications as of 01/07/2024  Medication Sig   albuterol  (VENTOLIN  HFA) 108 (90 Base) MCG/ACT inhaler Inhale 2 puffs into the lungs every 6 (six) hours as needed for wheezing or shortness of breath.   ALPRAZolam  (XANAX ) 0.5 MG tablet Take 1 tablet (0.5 mg total) by mouth 2 (two) times daily as needed for 7 days for anxiety, THEN 1 tablet (0.5 mg total) daily as needed for 7 days for anxiety, THEN 1 tablet (0.5 mg total) every other day for 7 days.   amLODipine  (NORVASC ) 5 MG tablet Take 1 tablet (5 mg total) by mouth daily.   divalproex  (DEPAKOTE ) 250 MG DR tablet Take 250 mg once daily for one week and then increase to 250 mg twice daily for one week and then increase to 250 mg in the morning and 500 mg   fluticasone -salmeterol (ADVAIR  HFA) 45-21 MCG/ACT inhaler Inhale 2 puffs into the lungs 2 (two) times daily.   gabapentin  (NEURONTIN ) 600 MG tablet Take 600 mg by mouth 2 (two) times daily.   levocetirizine (XYZAL ) 5 MG tablet Take 1 tablet (5 mg total) by mouth every evening.   lisinopril  (ZESTRIL ) 5 MG tablet Take 1 tablet (5 mg total) by mouth daily.   Misc. Devices MISC Right leg Prosthetic. Dx: Right AKA   nortriptyline (PAMELOR) 50 MG capsule Take 1 capsule by mouth at bedtime.   oxyCODONE -acetaminophen  (PERCOCET) 5-325 MG tablet Take 1 tablet by mouth every 4 (four) hours as needed for severe pain (pain score 7-10).   [DISCONTINUED] loratadine  (CLARITIN ) 10 MG tablet Take 1 tablet (10 mg total) by mouth daily.   [DISCONTINUED] triamcinolone  (NASACORT ) 55 MCG/ACT AERO nasal inhaler Place 2 sprays into the nose daily.   triamcinolone  (NASACORT ) 55 MCG/ACT AERO nasal inhaler Place 2 sprays into the nose daily.   [DISCONTINUED] gabapentin  (NEURONTIN ) 300 MG capsule Take 1 capsule (300 mg total) by mouth 3 (three) times daily. (Patient taking differently: Take 600 mg by mouth 4 (four) times daily.)   No facility-administered encounter medications on file as of  01/07/2024.

## 2024-01-08 ENCOUNTER — Other Ambulatory Visit: Payer: Self-pay | Admitting: Nurse Practitioner

## 2024-01-08 MED ORDER — ALBUTEROL SULFATE HFA 108 (90 BASE) MCG/ACT IN AERS
2.0000 | INHALATION_SPRAY | Freq: Four times a day (QID) | RESPIRATORY_TRACT | 1 refills | Status: DC | PRN
Start: 2024-01-08 — End: 2024-03-10

## 2024-01-08 MED ORDER — FLUTICASONE-SALMETEROL 45-21 MCG/ACT IN AERO: 2.0000 | INHALATION_SPRAY | Freq: Two times a day (BID) | RESPIRATORY_TRACT | 3 refills | Status: DC

## 2024-01-11 ENCOUNTER — Telehealth: Payer: Self-pay

## 2024-01-11 NOTE — Telephone Encounter (Signed)
 I have placed a referral for psych. Looks like patient prefers [facility]

## 2024-01-11 NOTE — Telephone Encounter (Signed)
 Copied from CRM [id number]. Topic: Referral - Request for Referral >> Jan 11, 2024  1:42 PM Adonis Hoot wrote: Did the patient discuss referral with their provider in the last year? Yes (If No - schedule appointment) (If Yes - send message)  Appointment offered? Yes  Type of order/referral and detailed reason for visit: [facility]/medication management  Preference of office, provider, location: [facility] Fax#: [phone] If referral order, have you been seen by this specialty before? No (If Yes, this issue or another issue? When? Where?  Can we respond through MyChart? Yes

## 2024-01-12 ENCOUNTER — Encounter: Payer: Self-pay | Admitting: *Deleted

## 2024-01-12 NOTE — Telephone Encounter (Addendum)
 Patient is scheduled with [facility] on 02/03/24 If he is no longer wanting this appt he will need to contact their office back and cancel the appt.   I need a new referral for [facility] if the patient is still wanting to be referred there. He will need to cancel his appt with [facility] first.   I also sent the patient a Mychart message with referral information.  Let me know how you would like to proceed

## 2024-01-12 NOTE — Telephone Encounter (Signed)
 Noted.   I will update the referral and get everything faxed over.

## 2024-01-13 ENCOUNTER — Telehealth: Payer: Self-pay | Admitting: Nurse Practitioner

## 2024-01-13 NOTE — Telephone Encounter (Signed)
 Relay this information to the patient please

## 2024-01-13 NOTE — Telephone Encounter (Signed)
 Called pt. No answer and Unable to leave voicemail.  Voicemail box not set up.

## 2024-01-13 NOTE — Telephone Encounter (Signed)
 Copied from CRM [id number]. Topic: General - Other >> Jan 13, 2024 11:22 AM Chuck Crater wrote: Reason for CRM: Patient states that he will find psychiatrist in the [facility] area and will call clinic back when he finds one.

## 2024-01-14 NOTE — Procedures (Signed)
 [facility], Department of [facility] Department of Neurology, Neurophysiology Laboratory [address]  [place], [place] [address]  Office:  [phone] / Fax:  [phone]  3 HOUR CONTINUOUSLY MONITORED VIDEO EEG  Name:  Joshua [name]                           Date of Birth:  07/19/1991    Referring Physician:  Self Account No:  [id number]    Recording Date: 01/14/2024 Leads On Tech:  Rosaline   Duration:  2 hours 21 minutes     Leads Off Tech:  Rosaline      Leads On Time:  2:05 pm Leads Off Time:  4:40 pm   The patient's identity was confirmed with two factors.  The EEG order was verified. The procedure, equipment, and anticipated test duration were reviewed with the patient.  Time was allowed for any questions and the patient agreed to proceed with the test.  Twenty-five electrodes were placed and the study was initiated.  CLINICAL HISTORY AND INDICATION/S:  33 y.o. male is referred for EEG due to evaluation for seizures. Episode occurred while sleeping and woke with soreness about the entire body, confusion, and urinary incontinence.    Epilepsy medications: depakote   Current Outpatient Medications:  .  albuterol  90 mcg/actuation inhaler, Inhale 2 inhalations into the lungs every 4 (four) hours as needed for Wheezing, Disp: 1 each, Rfl: 1 .  ALPRAZolam  (XANAX ) 0.5 MG tablet, Take 0.5 mg by mouth 3 (three) times daily as needed, Disp: , Rfl:  .  amLODIPine  (NORVASC ) 5 MG tablet, Take 5 mg by mouth once daily, Disp: , Rfl:  .  ARIPiprazole (ABILIFY) 10 MG tablet, Take 1 tablet (10 mg total) by mouth once daily, Disp: 90 tablet, Rfl: 3 .  dextroamphetamine-amphetamine (ADDERALL XR) 30 MG XR capsule, Take 30 mg by mouth every morning Takes BID for seizures (Patient not taking: Reported on 11/04/2023), Disp: , Rfl:  .  divalproex  (DEPAKOTE ) 250 MG DR tablet, TAKE 250 MG ONCE DAILY FOR ONE WEEK AND THEN INCREASE TO 250 MG TWICE DAILY FOR ONE WEEK AND THEN INCREASE TO 250 MG IN THE MORNING AND  500 MG AT NIGHT, Disp: 90 tablet, Rfl: 1 .  famotidine  (PEPCID ) 20 MG tablet, Take 20 mg by mouth once daily, Disp: , Rfl:  .  fluticasone  propion-salmeteroL (ADVAIR  HFA) 45-21 mcg/actuation inhaler, Inhale 2 inhalations into the lungs every 12 (twelve) hours, Disp: , Rfl:  .  fluticasone  propionate (FLONASE) 50 mcg/actuation nasal spray, Place 1 spray into both nostrils 2 (two) times daily, Disp: 16 g, Rfl: 0 .  gabapentin  (NEURONTIN ) 600 MG tablet, Take 1 tablet (600 mg total) by mouth 3 (three) times daily, Disp: 270 tablet, Rfl: 3 .  lamoTRIgine  (LAMICTAL ) 100 MG tablet, Take 100 mg by mouth every 12 (twelve) hours, Disp: , Rfl:  .  lisinopriL  (ZESTRIL ) 5 MG tablet, Take 1 tablet (5 mg total) by mouth once daily for 180 days, Disp: 30 tablet, Rfl: 5 .  methocarbamoL  (ROBAXIN ) 500 MG tablet, Take 500 mg by mouth 4 (four) times daily, Disp: , Rfl:  .  nortriptyline (PAMELOR) 10 MG capsule, Take 30 mg nightly for 1 week, then increase to 40 mg nightly (Patient taking differently: 40 mg Take 30 mg nightly for 1 week, then increase to 40 mg nightly), Disp: 120 capsule, Rfl: 3 .  nortriptyline (PAMELOR) 50 MG capsule, TAKE 1 CAPSULE BY MOUTH AT BEDTIME.,  Disp: 90 capsule, Rfl: 1 .  tiZANidine (ZANAFLEX) 4 MG tablet, Take 4 mg by mouth 3 (three) times daily (Patient not taking: Reported on 11/04/2023), Disp: , Rfl:   REASON:  Evaluate for ictal versus interictal epileptiform activity.  Characterize possible seizure disorder.  TECHNICAL: Electroencephalogram of the above-mentioned patient was acquired with [employer] equipment.  Electrodes were placed according to the internationally standardized 10-20 system, after individualized distance measurement.  The study was recorded digitally with a bandpass of 1-70 Hz and a sampling rate of at least 200 Hz and was reviewed with the possibility of multiple reformatting.  An EKG channel is utilized.  The patient is monitored on video synchronized to the EEG during this  recording and is continuously monitored for the duration of this study.  The patient was intermittently monitored by the technologist for the duration of this study.  DESCRIPTION: The posterior dominant rhythm is a 10 Hz activity that attenuates with eye opening.  The background is comprised primarily of alpha and beta range frequencies.    The patient enters into stage II sleep as seen by the appearance of sleep spindles.    There are no abnormal activities captured on video.  There are no patient pushbuttons indicating occurrence of a typical event or abnormal behaviors observed by the technologist.  There are no definite ictal or interictal epileptiform activities identified.  IMPRESSION: This 3 hour intermittently  monitored video EEG in the awake and asleep states is within normal limits.   I have reviewed, edited and added to the note as needed to reflect my best personal medical judgment.    Dr. Arthea Farrow, MD [facility] [place], [place] Ph:  [phone] Fax:  [phone]   2:05pm start 2:12pm awake on phone 2:22pm awake looking around 2:32pm reclined covered up 2:42pm movement 2:52pm laying on right side facing away from camera 3:02pm bathroom break 3:07pm family back in room bringing food 3:12pm eating  3:22pm girlfriend in room 3:32pm girlfriend got in the recliner with patient 3:42pm  pt holding phone in one hand and girlfriend in lap 3:52pm looking at phone, girl still in recliner 4:02pm watching phone 4:12pm  watching phone 4:22pm take off

## 2024-02-01 ENCOUNTER — Ambulatory Visit: Payer: Self-pay

## 2024-02-01 NOTE — Telephone Encounter (Signed)
 FYI Only or Action Required?: FYI only for provider  Patient was last seen in primary care on 01/07/2024 by Scherrie Curt, MD. Called Nurse Triage reporting Facial Swelling, Headache, Tremors, and Difficulty Walking. Symptoms began yesterday. Interventions attempted: Rest, hydration, or home remedies. Symptoms are: unchanged.  Triage Disposition: Go to ED or PCP/Alternative with Approval  Patient/caregiver understands and will follow disposition?: Yes                          Copied from CRM [id number]. Topic: Clinical - Red Word Triage >> Feb 01, 2024 11:46 AM Magdalene School wrote: Red Word that prompted transfer to Nurse Triage: head swelling in front from ear to ear sharp pain in the back of head hand and head tremors Reason for Disposition  Patient sounds very sick or weak to the triager  Answer Assessment - Initial Assessment Questions 1. ONSET: "When did the swelling start?" (e.g., minutes, hours, days)     "This started yesterday" 2. LOCATION: "What part of the face is swollen?"     "Pain is all the way around to my ears", "swelling was around my ears but it went down a bit", "mostly around my head" 3. SEVERITY: "How swollen is it?"     "It went down a little bit"; "a little" swelling to the front of his face, "feels like it's all over" 4. ITCHING: "Is there any itching?" If Yes, ask: "How much?"   (Scale 1-10; mild, moderate or severe)     "Yes, the top of my head, the side of my head" 5. PAIN: "Is the swelling painful to touch?" If Yes, ask: "How painful is it?"   (Scale 1-10; mild, moderate or severe)   - NONE (0): no pain   - MILD (1-3): doesn't interfere with normal activities    - MODERATE (4-7): interferes with normal activities or awakens from sleep    - SEVERE (8-10): excruciating pain, unable to do any normal activities      7/10 pain, to the touch  6. FEVER: "Do you have a fever?" If Yes, ask: "What is it, how was it measured, and when did it start?"       No  7. CAUSE: "What do you think is causing the face swelling?"     Not sure  8. RECURRENT SYMPTOM: "Have you had face swelling before?" If Yes, ask: "When was the last time?" "What happened that time?"     Yes  9. OTHER SYMPTOMS: "Do you have any other symptoms?" (e.g., toothache, leg swelling)  Denies difficulty swallowing. "It feels like I can't breathe, like I need air." Endorses SOB at rest. "I also need to have my thyroid checked, I have a lump to the side of my neck."  "My speech has been a little off, I did have an EEG done at [facility] and I called them today to see if they had the results and they didn't", states he had a seizure 2 wks ago ---- "here lately I have been talking out loud, I know I have said something, but I can't remember what I have said" (speech is not slurred), states his memory is worsening  "my mouth stays really dry", tongue looks swollen "kind of, sort of", states he bit his tongue      "Gets real hot", "sharp pains in the back of my head, shooting down my spine", "messing with my balance" -- endorses difficulty walking, states he falls every day ("  the leg I have is not any good"), pt states he may have hit the back of his head on a brick when he fell off the porch approx 3 wks ago. Pt states he fell down 5 flights of steps as well   Headache comes and goes - 6-7/10  Tremors to his hands, arms -- few months  Protocols used: Face Swelling-A-AH

## 2024-02-01 NOTE — Telephone Encounter (Signed)
Agree with ED disposition.

## 2024-02-03 ENCOUNTER — Ambulatory Visit: Admitting: Psychiatry

## 2024-02-04 ENCOUNTER — Ambulatory Visit: Admitting: Family Medicine

## 2024-02-04 NOTE — Progress Notes (Deleted)
     Natalin Bible T. Elchanan Bob, MD, CAQ Sports Medicine [facility] [address] [place], [address]  Phone: [phone]  FAX: [phone]  Joshua [name] - 33 y.o. male  MRN [id number]  Date of Birth: 09-Jan-1991  Date: 02/04/2024  PCP: Dorothe Gaster, NP  Referral: Dorothe Gaster, NP  No chief complaint on file.  Subjective:   Joshua [name] is a 33 y.o. very pleasant male patient with There is no height or weight on file to calculate BMI. who presents with the following:  The patient is here to be worked in urgently.    Review of Systems is noted in the HPI, as appropriate  Objective:   There were no vitals taken for this visit.  GEN: No acute distress; alert,appropriate. PULM: Breathing comfortably in no respiratory distress PSYCH: Normally interactive.   Laboratory and Imaging Data:  Assessment and Plan:   ***

## 2024-02-05 ENCOUNTER — Ambulatory Visit: Admitting: Family Medicine

## 2024-02-08 ENCOUNTER — Telehealth: Payer: Self-pay | Admitting: Nurse Practitioner

## 2024-02-08 NOTE — Telephone Encounter (Unsigned)
 Copied from CRM [id number]. Topic: Clinical - Medication Refill >> Feb 08, 2024  3:21 PM Baldo Levan wrote: Medication: ALPRAZolam  (XANAX ) 0.5 MG tablet [[id number]]  Has the patient contacted their pharmacy? Yes (Agent: If no, request that the patient contact the pharmacy for the refill. If patient does not wish to contact the pharmacy document the reason why and proceed with request.) (Agent: If yes, when and what did the pharmacy advise?)  This is the patient's preferred pharmacy:  [facility] [address] [place] - [address] [place] [address] Phone: [phone] Fax: [phone]  Is this the correct pharmacy for this prescription? Yes If no, delete pharmacy and type the correct one.   Has the prescription been filled recently? No  Is the patient out of the medication? Yes  Has the patient been seen for an appointment in the last year OR does the patient have an upcoming appointment? Yes  Can we respond through MyChart? Yes  Agent: Please be advised that Rx refills may take up to 3 business days. We ask that you follow-up with your pharmacy.

## 2024-02-08 NOTE — Telephone Encounter (Signed)
 I have had the discussion with patient that I would no longer fill this medication for him. I wrote a tapering dose the last time.

## 2024-02-09 ENCOUNTER — Ambulatory Visit (INDEPENDENT_AMBULATORY_CARE_PROVIDER_SITE_OTHER)
Admission: RE | Admit: 2024-02-09 | Discharge: 2024-02-09 | Disposition: A | Discharge status: Home / Self Care | Source: Ambulatory Visit | Attending: Nurse Practitioner | Admitting: Nurse Practitioner

## 2024-02-09 ENCOUNTER — Ambulatory Visit: Admitting: Nurse Practitioner

## 2024-02-09 VITALS — BP 130/60 | HR 98 | Temp 98.2°F | Ht 68.0 in | Wt 196.4 lb

## 2024-02-09 DIAGNOSIS — M546 Pain in thoracic spine: Secondary | ICD-10-CM | POA: Diagnosis not present

## 2024-02-09 DIAGNOSIS — M5440 Lumbago with sciatica, unspecified side: Secondary | ICD-10-CM

## 2024-02-09 DIAGNOSIS — W19XXXA Unspecified fall, initial encounter: Secondary | ICD-10-CM | POA: Diagnosis not present

## 2024-02-09 DIAGNOSIS — Z1329 Encounter for screening for other suspected endocrine disorder: Secondary | ICD-10-CM

## 2024-02-09 LAB — TSH: TSH: 1.23 u[IU]/mL (ref 0.35–5.50)

## 2024-02-09 MED ORDER — PREDNISONE 20 MG PO TABS: ORAL_TABLET | ORAL | 0 refills | Status: AC

## 2024-02-09 MED ORDER — KETOROLAC TROMETHAMINE 60 MG/2ML IM SOLN
60.0000 mg | Freq: Once | INTRAMUSCULAR | Status: AC
  Administered 2024-02-09: 60 mg via INTRAMUSCULAR

## 2024-02-09 NOTE — Assessment & Plan Note (Signed)
 Sequela of a fall.  Patient has not tolerated muscle relaxers in the past so we will forego those.  Patient can use over-the-counter analgesics such as Tylenol  and Toradol  60 mg IM pain x 1 dose.  Prednisone  taper as prescribed.  Pending x-ray.  Reviewed signs and symptoms when to seek emergent health care

## 2024-02-09 NOTE — Assessment & Plan Note (Signed)
 Pending x-ray right ankle taper as prescribed Toradol  60 mg IM x 1 dose.  Signs and symptoms reviewed when to seek emergent health care

## 2024-02-09 NOTE — Assessment & Plan Note (Signed)
 Neurologically intact pending x-rays

## 2024-02-09 NOTE — Progress Notes (Signed)
 Acute Office Visit  Subjective:     Patient ID: Joshua [name], male    DOB: 03/26/1991, 33 y.o.   MRN: [id number]  Chief Complaint  Patient presents with   Back Pain    Pt complains of mid to lower back pain that started a couple days ago when falling off back porch. Back is stabbing/dull pain. Pt states of numbness and tingling.      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  At that point he had recently had a lumbar MRI for neurosurgery.  Patient was also seeing [facility].  Patient states the pain was getting worse.  Patient was referred to interventional pain management in [place] to see if he qualified for injections. Do not see where patient went to interventional pain clinic.  Last time patient saw pain medicine through [facility] was 05/06/2022 but I can see.  Patient was prescribed tizanidine 4 mg.  Patient also noted to [facility]  States that you taking in groceries in and fell off the top steps. States that there are 6 steps He strcuk back and back of the head. He landed on the ground and gravel. He did not LOC. States that it hurts with rest and movement. States sharp and dull pain that does not change with movement. States that he is having numbness and tingling in the back but nothing in the leg or stump. No B&B involvement.   States that his left ear that has hurt nightly since the fall. No blood or discharge   States that he has been using ibuprofen  and tylenol . States he did a dose of ibuprofen  this monring around 8am  Review of Systems  Constitutional:  Negative for chills and fever.  Respiratory:  Negative for shortness of breath.   Cardiovascular:  Negative for chest pain.  Musculoskeletal:  Positive for back pain.  Neurological:   Positive for tingling. Negative for weakness.        Objective:    BP 130/60   Pulse 98   Temp 98.2 F (36.8 C) (Oral)   Ht 5' 8 (1.727 m)   Wt 196 lb 6.4 oz (89.1 kg)   SpO2 98%   BMI 29.86 kg/m    Physical Exam Vitals and nursing note reviewed.  Constitutional:      Appearance: Normal appearance.  HENT:     Right Ear: Tympanic membrane, ear canal and external ear normal.     Left Ear: Tympanic membrane, ear canal and external ear normal.   Eyes:     Extraocular Movements: Extraocular movements intact.     Pupils: Pupils are equal, round, and reactive to light.    Cardiovascular:     Rate and Rhythm: Normal rate and regular rhythm.     Heart sounds: Normal heart sounds.  Pulmonary:     Effort: Pulmonary effort is normal.     Breath sounds: Normal breath sounds.   Musculoskeletal:        General: Tenderness present.     Thoracic back: Bony tenderness present.     Lumbar back: Spasms, tenderness and bony tenderness present. Negative right straight leg raise test and negative left straight leg raise test.   Neurological:     Mental Status: He is alert.  Deep Tendon Reflexes:     Reflex Scores:      Bicep reflexes are 2+ on the right side and 2+ on the left side.      Patellar reflexes are 0 on the right side and 2+ on the left side.    Comments: Bilateral upper and lower extremity strength 5/5    No results found for any visits on 02/09/24.      Assessment & Plan:   Problem List Items Addressed This Visit       Nervous and Auditory   Acute bilateral low back pain with sciatica   Sequela of a fall.  Patient has not tolerated muscle relaxers in the past so we will forego those.  Patient can use over-the-counter analgesics such as Tylenol  and Toradol  60 mg IM pain x 1 dose.  Prednisone  taper as prescribed.  Pending x-ray.  Reviewed signs and symptoms when to seek emergent health care      Relevant Medications   predniSONE  (DELTASONE ) 20 MG tablet    Other Relevant Orders   DG Lumbar Spine Complete     Other   Fall - Primary   Neurologically intact pending x-rays      Acute midline thoracic back pain   Pending x-ray right ankle taper as prescribed Toradol  60 mg IM x 1 dose.  Signs and symptoms reviewed when to seek emergent health care      Relevant Medications   predniSONE  (DELTASONE ) 20 MG tablet   Other Relevant Orders   DG Thoracic Spine W/Swimmers   Other Visit Diagnoses       Screening for endocrine disorder       Relevant Orders   TSH       Meds ordered this encounter  Medications   predniSONE  (DELTASONE ) 20 MG tablet    Sig: Take 2 tablets (40 mg total) by mouth daily with breakfast for 3 days, THEN 1 tablet (20 mg total) daily with breakfast for 3 days. Avoid NSAIDs.    Dispense:  9 tablet    Refill:  0    Supervising Provider:   Deri Fleet A [[id number]]   ketorolac  (TORADOL ) injection 60 mg    Return if symptoms worsen or fail to improve.  Margarie Shay, NP

## 2024-02-09 NOTE — Telephone Encounter (Signed)
 Pt being seen in office today.

## 2024-02-09 NOTE — Patient Instructions (Signed)
 Nice to see you today I will be in touch with the xrays  While on prednisone  Avoid NSAIDS like Ibuprofen , Motrin , Aleve , Naproxen , BC/Goody powders

## 2024-02-10 ENCOUNTER — Ambulatory Visit: Payer: Self-pay | Admitting: Nurse Practitioner

## 2024-02-11 ENCOUNTER — Encounter: Payer: Self-pay | Admitting: Nurse Practitioner

## 2024-02-15 ENCOUNTER — Encounter: Payer: Self-pay | Admitting: Nurse Practitioner

## 2024-02-22 ENCOUNTER — Ambulatory Visit: Payer: Self-pay

## 2024-02-22 ENCOUNTER — Encounter: Payer: Self-pay | Admitting: Nurse Practitioner

## 2024-02-22 ENCOUNTER — Telehealth: Payer: Self-pay

## 2024-02-22 NOTE — Telephone Encounter (Signed)
 Copied from CRM [id number]. Topic: General - Other >> Feb 22, 2024  1:22 PM Joshua [name] wrote: Reason for CRM: patient would like to be called regarding his lab results. I read the lab results from 6/17 about his thyroids and the patient stated that those labs could not be right because he is sweating very bad and he has a lump in his throat and  his prostate has been bothering him. CB [phone]

## 2024-02-22 NOTE — Telephone Encounter (Signed)
  FYI Only or Action Required?: FYI only for provider.  Patient was last seen in primary care on 02/09/2024 by Wendee Lynwood HERO, NP. Called Nurse Triage reporting Prostate Concerns. Symptoms began last night. Interventions attempted: Nothing. Symptoms are: gradually worsening.  Triage Disposition: Go to ED Now (Notify PCP)  Patient/caregiver understands and will follow disposition?: Yes                      Copied from CRM [id number]. Topic: Clinical - Red Word Triage >> Feb 22, 2024  1:27 PM Rosina BIRCH wrote: Red Word that prompted transfer to Nurse Triage: patient called stating he is having pain with his prostate. Patient stated when he is about to get off, its something like pushes out. Patient stated it just started Reason for Disposition  Swollen scrotum  Answer Assessment - Initial Assessment Questions 1. LOCATION and RADIATION: Where is the pain located?      Scrotum area 2. QUALITY: What does the pain feel like?  (e.g., sharp, dull, aching, burning)     Not sharp but I can feel it 3. SEVERITY: How bad is the pain?  (Scale 1-10; or mild, moderate, severe)   - MILD (1-3): doesn't interfere with normal activities    - MODERATE (4-7): interferes with normal activities (e.g., work or school) or awakens from sleep   - SEVERE (8-10): excruciating pain, unable to do any normal activities, difficulty walking     7 4. ONSET: When did the pain start?     Last night during intercourse when patient was about to ejaculate---he states that he felt an uncomfortable feeling/pressure in his prostate/scrotum area and now has constant discomfort and swelling in this area. 5. PATTERN: Does it come and go, or has it been constant since it started?     constant 6. SCROTAL APPEARANCE: What does the scrotum look like? Is there any swelling or redness?      Swelling 7. HERNIA: Has a doctor ever told you that you have a hernia?     I think so 8. OTHER SYMPTOMS: Do you  have any other symptoms? (e.g., abdomen pain, difficulty passing urine, fever, vomiting)     Sweating really bad   Given that the patient is in pain and having swelling in his scrotum area---he is advised that the recommendation at this time is for him to go to the Emergency Room. Patient is also advised that if anything worsens at any point, he can call 911. Patient verbalized understanding and states he will go to the ER.  Protocols used: Scrotum Pain-A-AH

## 2024-02-22 NOTE — Telephone Encounter (Signed)
 Agree with immediate evaluation

## 2024-02-25 ENCOUNTER — Telehealth: Payer: Self-pay

## 2024-02-25 NOTE — Telephone Encounter (Signed)
 Copied from CRM [id number]. Topic: General - Other >> Feb 25, 2024  2:40 PM Martinique E wrote: Reason for CRM: Patient called in stating that he needs his PCP to send over a Micro processing type leg order for his right leg (above knee) over to [employer]. Patient also needs a molding order sent over as well. Patient did not have the fax number for [employer] but their phone number is [phone].

## 2024-02-29 NOTE — Telephone Encounter (Signed)
 Paperwork faxed

## 2024-02-29 NOTE — Telephone Encounter (Signed)
Addendum placed.

## 2024-02-29 NOTE — Telephone Encounter (Signed)
 Can we call the [facility] and have them send up an order for me to sign please

## 2024-02-29 NOTE — Telephone Encounter (Unsigned)
 Copied from CRM [id number]. Topic: General - Other >> Feb 29, 2024  1:03 PM Ernestene P wrote: Reason for CRM: Dawn- checking status of fax with letter outlining insurance requirements for pt Micro processing type leg order for his right leg (above knee) over to [employer]. Patient also needs a molding order sent over as well.

## 2024-03-10 ENCOUNTER — Ambulatory Visit
Admission: RE | Admit: 2024-03-10 | Discharge: 2024-03-10 | Disposition: A | Discharge status: Home / Self Care | Source: Ambulatory Visit | Attending: Nurse Practitioner | Admitting: Nurse Practitioner

## 2024-03-10 ENCOUNTER — Ambulatory Visit (INDEPENDENT_AMBULATORY_CARE_PROVIDER_SITE_OTHER): Admitting: Nurse Practitioner

## 2024-03-10 VITALS — BP 126/82 | HR 98 | Temp 97.9°F | Ht 68.0 in | Wt 202.4 lb

## 2024-03-10 DIAGNOSIS — R1084 Generalized abdominal pain: Secondary | ICD-10-CM | POA: Insufficient documentation

## 2024-03-10 DIAGNOSIS — F411 Generalized anxiety disorder: Secondary | ICD-10-CM

## 2024-03-10 DIAGNOSIS — I10 Essential (primary) hypertension: Secondary | ICD-10-CM | POA: Diagnosis not present

## 2024-03-10 LAB — CBC WITH DIFFERENTIAL/PLATELET
Basophils Absolute: 0.1 K/uL (ref 0.0–0.1)
Basophils Relative: 1.1 % (ref 0.0–3.0)
Eosinophils Absolute: 0.3 K/uL (ref 0.0–0.7)
Eosinophils Relative: 3.2 % (ref 0.0–5.0)
HCT: 47.4 % (ref 39.0–52.0)
Hemoglobin: 15.8 g/dL (ref 13.0–17.0)
Lymphocytes Relative: 18.5 % (ref 12.0–46.0)
Lymphs Abs: 1.7 K/uL (ref 0.7–4.0)
MCHC: 33.3 g/dL (ref 30.0–36.0)
MCV: 90.3 fl (ref 78.0–100.0)
Monocytes Absolute: 0.7 K/uL (ref 0.1–1.0)
Monocytes Relative: 7.5 % (ref 3.0–12.0)
Neutro Abs: 6.6 K/uL (ref 1.4–7.7)
Neutrophils Relative %: 69.7 % (ref 43.0–77.0)
Platelets: 339 K/uL (ref 150.0–400.0)
RBC: 5.25 Mil/uL (ref 4.22–5.81)
RDW: 13.9 % (ref 11.5–15.5)
WBC: 9.4 K/uL (ref 4.0–10.5)

## 2024-03-10 LAB — COMPREHENSIVE METABOLIC PANEL WITH GFR
ALT: 36 U/L (ref 0–53)
AST: 23 U/L (ref 0–37)
Albumin: 4.7 g/dL (ref 3.5–5.2)
Alkaline Phosphatase: 59 U/L (ref 39–117)
BUN: 12 mg/dL (ref 6–23)
CO2: 27 meq/L (ref 19–32)
Calcium: 9.3 mg/dL (ref 8.4–10.5)
Chloride: 104 meq/L (ref 96–112)
Creatinine, Ser: 0.92 mg/dL (ref 0.40–1.50)
GFR: 108.07 mL/min (ref >60.00)
Glucose, Bld: 91 mg/dL (ref 70–99)
Potassium: 4.2 meq/L (ref 3.5–5.1)
Sodium: 139 meq/L (ref 135–145)
Total Bilirubin: 0.4 mg/dL (ref 0.2–1.2)
Total Protein: 7.8 g/dL (ref 6.0–8.3)

## 2024-03-10 LAB — LIPASE: Lipase: 46 U/L (ref 11.0–59.0)

## 2024-03-10 MED ORDER — ALBUTEROL SULFATE HFA 108 (90 BASE) MCG/ACT IN AERS: 2.0000 | INHALATION_SPRAY | Freq: Four times a day (QID) | RESPIRATORY_TRACT | 1 refills | Status: DC | PRN

## 2024-03-10 MED ORDER — AMLODIPINE BESYLATE 5 MG PO TABS: 5.0000 mg | ORAL_TABLET | Freq: Every day | ORAL | 1 refills | Status: AC

## 2024-03-10 MED ORDER — BUSPIRONE HCL 5 MG PO TABS: 5.0000 mg | ORAL_TABLET | Freq: Two times a day (BID) | ORAL | 1 refills | Status: DC

## 2024-03-10 MED ORDER — IOHEXOL 300 MG/ML  SOLN
100.0000 mL | Freq: Once | INTRAMUSCULAR | Status: AC | PRN
  Administered 2024-03-10: 100 mL via INTRAVENOUS

## 2024-03-10 MED ORDER — IOHEXOL 9 MG/ML PO SOLN
500.0000 mL | ORAL | Status: AC
  Administered 2024-03-10 (×2): 500 mL via ORAL

## 2024-03-10 NOTE — Patient Instructions (Addendum)
 Nice to see you today I will be in touch with the labs and CT scan once I have it  I have ordered buspar  to help with anxiety Follow up with me in 4 weeks  Call and make an appointment with psychiatrist   [facility] [address] [place], [place] [address] Office: [phone] Fax: [phone]

## 2024-03-10 NOTE — Assessment & Plan Note (Signed)
 Hx of the same. He is requesting his alprazolam . I have told him many times that I would not prescribe that any longer as his drug screen had methamphetamine, he has been referred to pyschiarty and never made an appointment. Information given to him to make the appointment. Will do buspar  5mg  BID in the interim to help with anxiety  He mentions the alprazolam  also helps with his seizures. He was encouraged to reach out to his neurology provider to see if they would continue it

## 2024-03-10 NOTE — Assessment & Plan Note (Signed)
 3 weeks of generalized abdominal pain that is worse with eating. Ok with fluids. Has tried otc antacids. Check cbc/diff, cmp, lipase and stat CT abdomin and pelvis to r/o colitis, abscess, peritonitis. Will do a stool sample for H pylori as he needs to be NPO for the scan

## 2024-03-10 NOTE — Progress Notes (Signed)
 Acute Office Visit  Subjective:     Patient ID: Joshua [name], male    DOB: [birth year], 33 y.o.   MRN: [id number]  Chief Complaint  Patient presents with   GI Problem    Pt complains of swelled up stomach for 3 weeks. Pt states of abdominal pain and cramping with slight nausea. Pt complains of acid reflux     HPI Patient is in today for abdominal pain with a history of cardiac arrest, htn, seziures, anxiety, chronic back pain, tobacco abuse.  Symtpoms started approx 3 weeks. Staet that after he eats he feels bloated and feels it coming back up . Staets that he does get food up and will have abdominal pain. He does have burning in his chest. Tried tums and rolaids and that has not helped. He had a BM this moring. States that he is daily and todays was normal.   Anxiety and depression: states that he is having a hard time with it lately. States that when he has alprazolam  it helps with depression and anxiety and seizures. States that he has been feeling anixous and crying a lot. 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 He has been referred to psychiatry or if his neurologist wants to prescribe it that is a converstation that he can have with them      03/10/2024   12:09 PM 10/20/2023    1:34 PM 09/25/2023    9:52 AM  PHQ9 SCORE ONLY  PHQ-9 Total Score 9 5 8        03/10/2024   12:08 PM 10/20/2023    1:34 PM 09/25/2023    9:52 AM 08/25/2023    4:01 PM  GAD 7 : Generalized Anxiety Score  Nervous, Anxious, on Edge 3 1 2 3   Control/stop worrying 3 0 2 0  Worry too much - different things 3 1 2  0  Trouble relaxing 3 1 1 1   Restless 3 1 2 1   Easily annoyed or irritable 3 1 2 1   Afraid - awful might happen 2 0 2 1  Total GAD 7 Score 20 5 13 7   Anxiety Difficulty  Somewhat difficult Very difficult Extremely difficult       Review of Systems  Constitutional:  Positive for  chills. Negative for fever.  Gastrointestinal:  Positive for abdominal pain and nausea. Negative for constipation and vomiting.  Genitourinary:  Positive for frequency. Negative for dysuria and hematuria.       Difficulty starting stream         Objective:    BP 126/82   Pulse 98   Temp 97.9 F (36.6 C) (Oral)   Ht 5' 8 (1.727 m)   Wt 202 lb 6.4 oz (91.8 kg)   SpO2 98%   BMI 30.77 kg/m    Physical Exam Vitals and nursing note reviewed.  Constitutional:      Appearance: Normal appearance.  Cardiovascular:     Rate and Rhythm: Normal rate and regular rhythm.     Heart sounds: Normal heart sounds.  Pulmonary:     Effort: Pulmonary effort is normal.     Breath sounds: Normal breath sounds.  Abdominal:     General: Bowel sounds are normal. There is no distension.     Palpations: There is no mass.     Tenderness: There is abdominal tenderness.     Hernia: No hernia  is present.  Neurological:     Mental Status: He is alert.  Psychiatric:        Attention and Perception: Attention normal.        Mood and Affect: Affect is tearful.        Speech: Speech normal.        Behavior: Behavior normal.        Thought Content: Thought content normal.        Cognition and Memory: Cognition normal.        Judgment: Judgment normal.     No results found for any visits on 03/10/24.      Assessment & Plan:   Problem List Items Addressed This Visit       Other   GAD (generalized anxiety disorder)   Hx of the same. He is requesting his alprazolam . I have told him many times that I would not prescribe that any longer as his drug screen had methamphetamine, he has been referred to pyschiarty and never made an appointment. Information given to him to make the appointment. Will do buspar  5mg  BID in the interim to help with anxiety  He mentions the alprazolam  also helps with his seizures. He was encouraged to reach out to his neurology provider to see if they would continue it       Relevant Medications   busPIRone  (BUSPAR ) 5 MG tablet   Generalized abdominal pain - Primary   3 weeks of generalized abdominal pain that is worse with eating. Ok with fluids. Has tried otc antacids. Check cbc/diff, cmp, lipase and stat CT abdomin and pelvis to r/o colitis, abscess, peritonitis. Will do a stool sample for H pylori as he needs to be NPO for the scan       Relevant Orders   CBC with Differential/Platelet   Comprehensive metabolic panel with GFR   Lipase   Helicobacter Pylori Special Antigen, Stool   CT ABDOMEN PELVIS W CONTRAST   Urinalysis w microscopic + reflex cultur    Meds ordered this encounter  Medications   busPIRone  (BUSPAR ) 5 MG tablet    Sig: Take 1 tablet (5 mg total) by mouth 2 (two) times daily.    Dispense:  60 tablet    Refill:  1    Supervising Provider:   RANDEEN HARDY A [[id number]]    Return in about 4 weeks (around 04/07/2024) for GAD.  Adina Crandall, NP

## 2024-03-11 ENCOUNTER — Ambulatory Visit: Payer: Self-pay | Admitting: Nurse Practitioner

## 2024-03-11 DIAGNOSIS — K529 Noninfective gastroenteritis and colitis, unspecified: Secondary | ICD-10-CM

## 2024-03-11 MED ORDER — AMOXICILLIN-POT CLAVULANATE 875-125 MG PO TABS: 1.0000 | ORAL_TABLET | Freq: Two times a day (BID) | ORAL | 0 refills | Status: AC

## 2024-03-11 NOTE — Telephone Encounter (Signed)
 Copied from CRM [id number]. Topic: Clinical - Lab/Test Results >> Mar 11, 2024  2:09 PM Mia F wrote: Reason for CRM: Lab results from pt CT were given and read word from word. Pt understood and did not have any questions or concerns at this time.

## 2024-03-15 ENCOUNTER — Emergency Department
Admission: EM | Admit: 2024-03-15 | Discharge: 2024-03-15 | Discharge status: Against Medical Advice | Attending: Emergency Medicine | Admitting: Emergency Medicine

## 2024-03-15 ENCOUNTER — Other Ambulatory Visit: Payer: Self-pay

## 2024-03-15 ENCOUNTER — Ambulatory Visit: Payer: Self-pay

## 2024-03-15 DIAGNOSIS — R1030 Lower abdominal pain, unspecified: Secondary | ICD-10-CM | POA: Diagnosis present

## 2024-03-15 DIAGNOSIS — Z5321 Procedure and treatment not carried out due to patient leaving prior to being seen by health care provider: Secondary | ICD-10-CM | POA: Diagnosis not present

## 2024-03-15 DIAGNOSIS — I10 Essential (primary) hypertension: Secondary | ICD-10-CM | POA: Diagnosis not present

## 2024-03-15 DIAGNOSIS — K529 Noninfective gastroenteritis and colitis, unspecified: Secondary | ICD-10-CM | POA: Insufficient documentation

## 2024-03-15 LAB — CBC
HCT: 45.6 % (ref 39.0–52.0)
Hemoglobin: 15 g/dL (ref 13.0–17.0)
MCH: 29.9 pg (ref 26.0–34.0)
MCHC: 32.9 g/dL (ref 30.0–36.0)
MCV: 91 fL (ref 80.0–100.0)
Platelets: 347 K/uL (ref 150–400)
RBC: 5.01 MIL/uL (ref 4.22–5.81)
RDW: 12.8 % (ref 11.5–15.5)
WBC: 13.4 K/uL — ABNORMAL HIGH (ref 4.0–10.5)
nRBC: 0 % (ref 0.0–0.2)

## 2024-03-15 LAB — COMPREHENSIVE METABOLIC PANEL WITH GFR
ALT: 26 U/L (ref 0–44)
AST: 22 U/L (ref 15–41)
Albumin: 3.9 g/dL (ref 3.5–5.0)
Alkaline Phosphatase: 50 U/L (ref 38–126)
Anion gap: 12 (ref 5–15)
BUN: 13 mg/dL (ref 6–20)
CO2: 25 mmol/L (ref 22–32)
Calcium: 9.5 mg/dL (ref 8.9–10.3)
Chloride: 105 mmol/L (ref 98–111)
Creatinine, Ser: 1.14 mg/dL (ref 0.61–1.24)
GFR, Estimated: >60 mL/min (ref >60)
Glucose, Bld: 88 mg/dL (ref 70–99)
Potassium: 4.1 mmol/L (ref 3.5–5.1)
Sodium: 142 mmol/L (ref 135–145)
Total Bilirubin: 0.5 mg/dL (ref 0.0–1.2)
Total Protein: 7.2 g/dL (ref 6.5–8.1)

## 2024-03-15 LAB — LIPASE, BLOOD: Lipase: 46 U/L (ref 11–51)

## 2024-03-15 NOTE — ED Triage Notes (Signed)
 Pt to ED via POV c/o lower abd pain and swelling x1 month. Pt reports pain has been getting worse and he always feels full. Had CT done 7/17 and showed colitis. Was prescribed abx. Hx renal failure, HTN.

## 2024-03-15 NOTE — Telephone Encounter (Signed)
 FYI Only or Action Required?: FYI only for provider.  Patient was last seen in primary care on 03/10/2024 by Wendee Lynwood HERO, NP.  Called Nurse Triage reporting Abdominal Pain.  Symptoms began about a month ago.  Interventions attempted: Prescription medications: antibiotics.  Symptoms are: rapidly worsening.  Triage Disposition: Go to ED Now (Notify PCP)  Patient/caregiver understands and will follow disposition?: Yes  Copied from CRM [id number]. Topic: Clinical - Red Word Triage >> Mar 15, 2024  4:41 PM Winona R wrote: Pt having lower right stomach pain, prostate pain and lack of energy been on antibiotics for about 5 days for It does show inflammation of the large bowel (colon) for possible infection. However symptoms are not going away. Reason for Disposition  [1] SEVERE pain (e.g., excruciating) AND [2] present > 1 hour  Answer Assessment - Initial Assessment Questions 1. LOCATION: Where does it hurt?      Right sided abdomen 2. RADIATION: Does the pain shoot anywhere else? (e.g., chest, back)     Prostate and down legs  3. ONSET: When did the pain begin? (Minutes, hours or days ago)      One month ago 4. SUDDEN: Gradual or sudden onset?     Gradual 5. PATTERN Does the pain come and go, or is it constant?     Constant but more intense with eating. Now 'looking like he is pregnant from the swelling' 6. SEVERITY: How bad is the pain?  (e.g., Scale 1-10; mild, moderate, or severe)     Severe 7. RECURRENT SYMPTOM: Have you ever had this type of stomach pain before? If Yes, ask: When was the last time? and What happened that time?      Yes ongoing for one month. Had similar symptoms as a child he is unsure the treatment.  8. CAUSE: What do you think is causing the stomach pain? (e.g., gallstones, recent abdominal surgery)     Unsure 9. RELIEVING/AGGRAVATING FACTORS: What makes it better or worse? (e.g., antacids, bending or twisting motion, bowel movement)      Eating makes it worse he immediately has swelling.  10. OTHER SYMPTOMS: Do you have any other symptoms? (e.g., back pain, diarrhea, fever, urination pain, vomiting)       Nausea. New difficulty ambulating due to radiating pain to leg.   Additional info: Evaluated on 03/10/24 and started antibiotics which he has been taking as prescribed, called today to schedule follow up for symptoms not improving and new symptoms. Now have abdominal bloating-severe after small amount of food, new ambulation issue due to radiating pain.  Protocols used: Abdominal Pain - Male-A-AH

## 2024-03-16 NOTE — Telephone Encounter (Signed)
 Agree with disposition.

## 2024-03-21 ENCOUNTER — Ambulatory Visit: Payer: Self-pay

## 2024-03-21 NOTE — Telephone Encounter (Signed)
 FYI Only or Action Required?: FYI only for provider.  Patient was last seen in primary care on 03/10/2024 by Wendee Lynwood HERO, NP.  Called Nurse Triage reporting Abdominal Pain.  Symptoms began about a month ago.  Interventions attempted: Other: pt evaluated in ED 07/25.  Symptoms are: unchanged.  Triage Disposition: See PCP Within 2 Weeks  Patient/caregiver understands and will follow disposition?: Yes         Copied from CRM [id number]. Topic: Clinical - Red Word Triage >> Mar 21, 2024  8:33 AM Emylou G wrote: [facility] that prompted transfer to Nurse Triage: Needs referral GI.. something found in his prostate area, liquid leakage, abdominal bed, hasn't been to sleep yet - feels like it wants to come up in his throat.. feels swollen down there too.. feeling fatigue and confusion Reason for Disposition  Abdominal pain is a chronic symptom (recurrent or ongoing AND present > 4 weeks)  Answer Assessment - Initial Assessment Questions 1. LOCATION: Where does it hurt?      Low Abd 2. RADIATION: Does the pain shoot anywhere else? (e.g., chest, back)     Pt reports pain to anus/scrotum intermittently 3. ONSET: When did the pain begin? (Minutes, hours or days ago)      Ongoing 7. RECURRENT SYMPTOM: Have you ever had this type of stomach pain before? If Yes, ask: When was the last time? and What happened that time?      Yes, pt reports he was advised to see GI in ED on 07/25 8. CAUSE: What do you think is causing the stomach pain? (e.g., gallstones, recent abdominal surgery)     Unknown 9. RELIEVING/AGGRAVATING FACTORS: What makes it better or worse? (e.g., antacids, bending or twisting motion, bowel movement)     NA 10. OTHER SYMPTOMS: Do you have any other symptoms? (e.g., back pain, diarrhea, fever, urination pain, vomiting)       Denies fever, N/V  Protocols used: Abdominal Pain - Male-A-AH

## 2024-03-21 NOTE — Telephone Encounter (Signed)
 Appreciate Dr. Graham evaluation

## 2024-03-22 NOTE — ED Notes (Signed)
 I supervised care provided by the resident. We have discussed the case, I have reviewed the note and I agree with the plan of treatment.  I personally interviewed the patient and examined the patient.   I have reviewed the nursing notes.  Pertinent labs & imaging results which were available during my care of the patient were reviewed by me. See chart and resident documentation for details.  Portions of this record have been created using Scientist, clinical (histocompatibility and immunogenetics). Dictation errors have been sought, but may not have been identified and corrected.     Final diagnoses:  Abdominal pain, unspecified abdominal location (Primary)  Common bile duct dilation    Disposition: Repeat abdominal exams benign, tolerating p.o.  Discussed findings and strict return precautions. I discussed my evaluation of the patient's symptoms, my clinical impression, and my proposed outpatient treatment plan. We have discussed anticipatory guidance, scheduled follow-up, and careful return precautions. I provided an opportunity and answered any questions the patient had. The patient express understanding and are comfortable with the discharge plan.

## 2024-03-25 ENCOUNTER — Inpatient Hospital Stay: Admitting: Family Medicine

## 2024-03-27 ENCOUNTER — Emergency Department
Admission: EM | Admit: 2024-03-27 | Discharge: 2024-03-27 | Disposition: A | Discharge status: Home / Self Care | Attending: Emergency Medicine | Admitting: Emergency Medicine

## 2024-03-27 ENCOUNTER — Emergency Department

## 2024-03-27 ENCOUNTER — Other Ambulatory Visit: Payer: Self-pay

## 2024-03-27 DIAGNOSIS — F1320 Sedative, hypnotic or anxiolytic dependence, uncomplicated: Secondary | ICD-10-CM | POA: Diagnosis present

## 2024-03-27 DIAGNOSIS — Z720 Tobacco use: Secondary | ICD-10-CM | POA: Diagnosis not present

## 2024-03-27 DIAGNOSIS — Z89611 Acquired absence of right leg above knee: Secondary | ICD-10-CM | POA: Diagnosis not present

## 2024-03-27 DIAGNOSIS — I10 Essential (primary) hypertension: Secondary | ICD-10-CM | POA: Diagnosis not present

## 2024-03-27 LAB — CBC WITH DIFFERENTIAL/PLATELET
Abs Immature Granulocytes: 0.02 K/uL (ref 0.00–0.07)
Basophils Absolute: 0.1 K/uL (ref 0.0–0.1)
Basophils Relative: 1 %
Eosinophils Absolute: 0.4 K/uL (ref 0.0–0.5)
Eosinophils Relative: 5 %
HCT: 45.9 % (ref 39.0–52.0)
Hemoglobin: 15.5 g/dL (ref 13.0–17.0)
Immature Granulocytes: 0 %
Lymphocytes Relative: 23 %
Lymphs Abs: 1.9 K/uL (ref 0.7–4.0)
MCH: 29.6 pg (ref 26.0–34.0)
MCHC: 33.8 g/dL (ref 30.0–36.0)
MCV: 87.8 fL (ref 80.0–100.0)
Monocytes Absolute: 0.6 K/uL (ref 0.1–1.0)
Monocytes Relative: 8 %
Neutro Abs: 5.1 K/uL (ref 1.7–7.7)
Neutrophils Relative %: 63 %
Platelets: 392 K/uL (ref 150–400)
RBC: 5.23 MIL/uL (ref 4.22–5.81)
RDW: 11.9 % (ref 11.5–15.5)
WBC: 8 K/uL (ref 4.0–10.5)
nRBC: 0 % (ref 0.0–0.2)

## 2024-03-27 LAB — BASIC METABOLIC PANEL WITH GFR
Anion gap: 10 (ref 5–15)
BUN: 11 mg/dL (ref 6–20)
CO2: 23 mmol/L (ref 22–32)
Calcium: 9 mg/dL (ref 8.9–10.3)
Chloride: 103 mmol/L (ref 98–111)
Creatinine, Ser: 0.95 mg/dL (ref 0.61–1.24)
GFR, Estimated: >60 mL/min (ref >60)
Glucose, Bld: 92 mg/dL (ref 70–99)
Potassium: 4.1 mmol/L (ref 3.5–5.1)
Sodium: 136 mmol/L (ref 135–145)

## 2024-03-27 LAB — RESP PANEL BY RT-PCR (RSV, FLU A&B, COVID)  RVPGX2
Influenza A by PCR: NEGATIVE
Influenza B by PCR: NEGATIVE
Resp Syncytial Virus by PCR: NEGATIVE
SARS Coronavirus 2 by RT PCR: NEGATIVE

## 2024-03-27 NOTE — ED Triage Notes (Signed)
 Pt comes in via pov with complaints of headache for the past 2 weeks. Pt complains of pain 8/10. Pt states that he was previously treated for an infection in his prostate. Pt complains that he just doesn't feel right. Pt was on Xanax  at one point, and was taken off of it abruptly. Pt has a history of anxiety, and seizures.

## 2024-03-27 NOTE — ED Notes (Signed)
 Pt refused CT scan and is still demanding Xanax .

## 2024-03-27 NOTE — Discharge Instructions (Signed)
 You may follow-up with your outpatient providers for further management given that you did not wish to complete your emergency department workup today.  Please return if you change your mind or if you develop any new, worsening, or change in symptoms or other concerns.

## 2024-03-27 NOTE — ED Notes (Signed)
 Pt asked for Xanax . Provider was messaged. Then about 5 minutes later, pt called on call bell demanding Xanax . Informed pt that provider had been notified. Pt became irritable and irrational, asking well what is she doing right now?! And stating if ya'll had someone dying, you would rush in there. Informed him again that nurses do not order Xanax  and that message had been relayed. Pt has not provided UA sample yet.

## 2024-03-27 NOTE — ED Notes (Signed)
 Gave AVS to pt who did not respond and continued talking to his partner, stating If I have a seizure, make sure you sue this goddamn place.

## 2024-03-27 NOTE — ED Notes (Signed)
 Provider was at bedside again. Pt again demanding Xanax . Provider informed pt that Xanax  not going to be ordered, and had already been discontinued by PCP and neurologist. Pt told provider well that's evil! And continues to refuse rest of workup.

## 2024-03-27 NOTE — ED Provider Notes (Signed)
 [facility] Provider Note    Event Date/Time   First MD Initiated Contact with Patient 03/27/24 1052     (approximate)   History   Headache   HPI  Joshua [name] is a 33 y.o. male with a past medical history of AKA, cocaine use disorder, opiate use disorder, tobacco use, seizure disorder, who presents today for evaluation of feeling unwell ever since being taken off of his benzodiazepine 1 month ago.  Minus.  Patient reports that he also has had headaches.  He reports that he feels anxious.  He would like to be put back onto his Xanax .  He also notes that he is having runny nose for the past several days to several weeks, he is not sure how long it has been ongoing. He also notes sleep disturbance, tremors, anxiety, headaches, irritability, sweating since being taken off his xanax .   Patient Active Problem List   Diagnosis Date Noted   Generalized abdominal pain 03/10/2024   Fall 02/09/2024   Acute midline thoracic back pain 02/09/2024   Overweight 12/04/2023   History of substance abuse (HCC) 12/04/2023   HNP (herniated nucleus pulposus), cervical 09/25/2023   Lumbar disc disease 09/25/2023   Hypertension 09/25/2023   AKA stump complication (HCC) 08/25/2023   Seasonal allergies 12/09/2019   Preventative health care 12/09/2019   Body mass index 26.0-26.9, adult 12/09/2019   History of seizures 10/21/2019   Acute bilateral low back pain with sciatica 06/13/2019   MDD (major depressive disorder), single episode, moderate (HCC) 03/30/2019   GAD (generalized anxiety disorder) 03/30/2019   Cocaine use disorder, severe, in sustained remission (HCC) 03/30/2019   Opioid use disorder, moderate, in sustained remission (HCC) 03/30/2019   Tobacco user 03/30/2019   History of renal failure 09/02/2018   Seizure disorder (HCC) 09/02/2018   S/P AKA (above knee amputation), 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   Impotence 12/08/2017   Erectile dysfunction 12/08/2017   Chronic  bilateral low back pain without sciatica 12/08/2017   Polysubstance abuse (HCC) 12/03/2017   Adjustment disorder with mixed anxiety and depressed mood 11/13/2017   Cardiogenic shock (HCC) 10/30/2017   Cardiac arrest (HCC) 10/30/2017          Physical Exam   Triage Vital Signs: ED Triage Vitals  Encounter Vitals Group     BP 03/27/24 1038 (!) 144/89     Girls Systolic BP Percentile --      Girls Diastolic BP Percentile --      Boys Systolic BP Percentile --      Boys Diastolic BP Percentile --      Pulse Rate 03/27/24 1038 96     Resp 03/27/24 1038 19     Temp 03/27/24 1038 98.2 F (36.8 C)     Temp src --      SpO2 03/27/24 1038 100 %     Weight 03/27/24 1033 200 lb (90.7 kg)     Height 03/27/24 1033 5' 10 (1.778 m)     Head Circumference --      Peak Flow --      Pain Score 03/27/24 1033 10     Pain Loc --      Pain Education --      Exclude from Growth Chart --     Most recent vital signs: Vitals:   03/27/24 1038  BP: (!) 144/89  Pulse: 96  Resp: 19  Temp: 98.2 F (36.8 C)  SpO2: 100%    Physical Exam Vitals and  nursing note reviewed.  Constitutional:      General: Awake and alert. No acute distress. Unkempt    Appearance: Normal appearance. The patient is normal weight.  HENT:     Head: Normocephalic and atraumatic.     Mouth: Mucous membranes are moist.  Rhinorrhea present Eyes:     General: PERRL. Normal EOMs        Right eye: No discharge.        Left eye: No discharge.     Conjunctiva/sclera: Conjunctivae normal.  Cardiovascular:     Rate and Rhythm: Normal rate and regular rhythm.     Pulses: Normal pulses.  Pulmonary:     Effort: Pulmonary effort is normal. No respiratory distress.     Breath sounds: Normal breath sounds.  Abdominal:     Abdomen is soft. There is no abdominal tenderness. No rebound or guarding. No distention. Musculoskeletal:        General: No swelling. Normal range of motion.     Cervical back: Normal range of motion  and neck supple.  Skin:    General: Skin is warm and dry.     Capillary Refill: Capillary refill takes less than 2 seconds.     Findings: No rash.  Neurological:     Mental Status: The patient is awake and alert.  Neurological: GCS 15 alert and oriented x3 Normal speech, no expressive or receptive aphasia or dysarthria Cranial nerves II through XII intact Normal visual fields 5 out of 5 strength in all 4 extremities with intact sensation throughout No extremity drift Normal finger-to-nose testing, no limb or truncal ataxia     ED Results / Procedures / Treatments   Labs (all labs ordered are listed, but only abnormal results are displayed) Labs Reviewed  RESP PANEL BY RT-PCR (RSV, FLU A&B, COVID)  [id number]  BASIC METABOLIC PANEL WITH GFR  CBC WITH DIFFERENTIAL/PLATELET  URINALYSIS, ROUTINE W REFLEX MICROSCOPIC  URINE DRUG SCREEN, QUALITATIVE ([facility] ONLY)     EKG     RADIOLOGY     PROCEDURES:  Critical Care performed:   Procedures   MEDICATIONS ORDERED IN ED: Medications - No data to display   IMPRESSION / MDM / ASSESSMENT AND PLAN / ED COURSE  I reviewed the triage vital signs and the nursing notes.   Differential diagnosis includes, but is not limited to, benzodiazepine withdrawal, COVID-19, drug dependence, infection, electrolyte disarray, dehydration.  I reviewed the patient's chart.  Patient follows with outpatient tonsils, no erythema James cable and has requested his alprazolam  multiple times, however per the NP note on 03/10/2024, he is no longer going to prescribe that medication as his drug screen had methamphetamines.  He was referred to psychiatry for continued medication management and in the meantime patient was started on BuSpar  5 mg twice daily to help with anxiety.  Patient also reported that the medicine helps with his seizures and he was encouraged to follow-up with his neurologist to see if they would continue it for him.  Per note from Dr.  Lane on 03/15/2024, patient is on Depakote  for seizure control.  Patient is awake and alert, hemodynamically stable and afebrile.  Patient is demanding benzodiazepines from the moment that he arrives.  He has been off of benzodiazepines for nearly a month, I am not concerned for acute benzodiazepine withdrawal seizure.  However, I did recommend further workup including labs, CT of his head given that he reports that he has had chronic headaches, and urinalysis.  However, after I  told patient that I was not going to give him benzodiazepines today he became very irritable and made the RN uncomfortable.  Patient does see neurology for seizures, and he is on Depakote  for seizure control.  He had a EEG on 01/14/2024 which was normal.  I advised that we continue patient's workup today given his symptoms, however patient did not wish to complete his workup after he was told that he was not going to get benzodiazepines today.  He was discharged in the emergency department.  However I advised that he may return to the emergency department at any time if he changes his mind, or for any new, worsening, or change in symptoms or other concerns.  Patient's presentation is most consistent with acute complicated illness / injury requiring diagnostic workup.   Clinical Course as of 03/27/24 1521  Sun Mar 27, 2024  1212 Patient is refusing further workup until he gets Xanax .  Discussed with patient that I am not going to give him Xanax  because he has been taken off of it by all of his outpatient providers.  Patient will not give urine and will not go to CAT scan.  Discussed with him the reasons for ordering these tests.  Patient is requesting to be discharged [JP]    Clinical Course User Index [JP] Kyliah Deanda E, PA-C     FINAL CLINICAL IMPRESSION(S) / ED DIAGNOSES   Final diagnoses:  Benzodiazepine dependence (HCC)     Rx / DC Orders   ED Discharge Orders     None        Note:  This document was  prepared using Dragon voice recognition software and may include unintentional dictation errors.   Aiden Helzer E, PA-C 03/27/24 1521    Suzanne Kirsch, MD 03/28/24 [phone]

## 2024-03-30 ENCOUNTER — Ambulatory Visit (INDEPENDENT_AMBULATORY_CARE_PROVIDER_SITE_OTHER): Admitting: Family Medicine

## 2024-03-30 ENCOUNTER — Encounter: Payer: Self-pay | Admitting: Family Medicine

## 2024-03-30 VITALS — BP 126/86 | HR 111 | Temp 98.1°F | Ht 70.0 in | Wt 201.2 lb

## 2024-03-30 DIAGNOSIS — R300 Dysuria: Secondary | ICD-10-CM | POA: Insufficient documentation

## 2024-03-30 DIAGNOSIS — F4323 Adjustment disorder with mixed anxiety and depressed mood: Secondary | ICD-10-CM

## 2024-03-30 NOTE — Progress Notes (Signed)
 Subjective:    Patient ID: Joshua [name], male    DOB: 03-15-1991, 33 y.o.   MRN: [id number]  HPI  Wt Readings from Last 3 Encounters:  03/30/24 201 lb 4 oz (91.3 kg)  03/27/24 200 lb (90.7 kg)  03/15/24 202 lb (91.6 kg)   28.88 kg/m  Vitals:   03/30/24 1202  BP: 126/86  Pulse: (!) 111  Temp: 98.1 F (36.7 C)  SpO2: 95%    33 yo pt of [facility] presents for follow up of recent hospitalization 8/3   Pmx remarkable for  Cocaine and opiate use disorder, bobacco use, seizure disorder  Presented to hospital feeling unwell after being taken off his benzodiazepine 1 month ago  Headaches and anxiety  Wanted to be put back on xanax    Prior to that pcp declined refill of xanax  due to drug screen being positive for methamphetamines  Was ref to psychiatry and wtarted on buspar  5 mg bid for anxiety  Sees Dr Lane (neuro) for seizures -on depakote   Last EEG 01/14/24 -normal   He refuses to return to [facility] based on his refusal to prescribe xanax    Benzodiazapine medication was declined in ER  Labs Results for orders placed or performed during the hospital encounter of 03/27/24  Resp panel by RT-PCR (RSV, Flu A&B, Covid) Anterior Nasal Swab   Collection Time: 03/27/24 10:59 AM   Specimen: Anterior Nasal Swab  Result Value Ref Range   SARS Coronavirus 2 by RT PCR NEGATIVE NEGATIVE   Influenza A by PCR NEGATIVE NEGATIVE   Influenza B by PCR NEGATIVE NEGATIVE   Resp Syncytial Virus by PCR NEGATIVE NEGATIVE  Basic metabolic panel   Collection Time: 03/27/24 11:09 AM  Result Value Ref Range   Sodium 136 135 - 145 mmol/L   Potassium 4.1 3.5 - 5.1 mmol/L   Chloride 103 98 - 111 mmol/L   CO2 23 22 - 32 mmol/L   Glucose, Bld 92 70 - 99 mg/dL   BUN 11 6 - 20 mg/dL   Creatinine, Ser 9.04 0.61 - 1.24 mg/dL   Calcium  9.0 8.9 - 10.3 mg/dL   GFR, Estimated >39 >39 mL/min   Anion gap 10 5 - 15  CBC with Differential   Collection Time: 03/27/24 11:09 AM  Result Value Ref Range   WBC  8.0 4.0 - 10.5 K/uL   RBC 5.23 4.22 - 5.81 MIL/uL   Hemoglobin 15.5 13.0 - 17.0 g/dL   HCT 54.0 60.9 - 47.9 %   MCV 87.8 80.0 - 100.0 fL   MCH 29.6 26.0 - 34.0 pg   MCHC 33.8 30.0 - 36.0 g/dL   RDW 88.0 88.4 - 84.4 %   Platelets 392 150 - 400 K/uL   nRBC 0.0 0.0 - 0.2 %   Neutrophils Relative % 63 %   Neutro Abs 5.1 1.7 - 7.7 K/uL   Lymphocytes Relative 23 %   Lymphs Abs 1.9 0.7 - 4.0 K/uL   Monocytes Relative 8 %   Monocytes Absolute 0.6 0.1 - 1.0 K/uL   Eosinophils Relative 5 %   Eosinophils Absolute 0.4 0.0 - 0.5 K/uL   Basophils Relative 1 %   Basophils Absolute 0.1 0.0 - 0.1 K/uL   Immature Granulocytes 0 %   Abs Immature Granulocytes 0.02 0.00 - 0.07 K/uL      Last CT of abd/pelvis 7/17 last month  IMPRESSION: 1. Some evidence of long segment colon wall thickening, particularly involving the distal transverse and  descending colon, suggesting nonspecific infectious or inflammatory colitis.   2.  No other acute findings in the abdomen or pelvis.  MR brain 09/2023 noted normal exam    Med list includes  Nortriptyline  Oxycodone  8 tals from April Gabapentin   Buprenorphine-hcl-naloxone  from June (per state database refilled 6/5)   Today notes  Anxiety is worse -wants xanax  and upset we will not give it to him  Does not understand why we won't give him xanax   Feels like his face is wrinkled and he is aging too fast  Tired  Per pt [facility] told him something was wrong with prostate ?  Prostate is bothering him  Angry his xanax  was stopped    Sharp pain in prostate area  Does not think he can give a urine sample  Unsure when his upcoming psychiatry appointment is   Here with girlfriend today She drove him  Says she supportive somewhat Tough childhood          Patient Active Problem List   Diagnosis Date Noted   Dysuria 03/30/2024   Generalized abdominal pain 03/10/2024   Fall 02/09/2024   Acute midline thoracic back pain 02/09/2024    Overweight 12/04/2023   History of substance abuse (HCC) 12/04/2023   HNP (herniated nucleus pulposus), cervical 09/25/2023   Lumbar disc disease 09/25/2023   Hypertension 09/25/2023   AKA stump complication (HCC) 08/25/2023   Seasonal allergies 12/09/2019   Preventative health care 12/09/2019   Body mass index 26.0-26.9, adult 12/09/2019   History of seizures 10/21/2019   Acute bilateral low back pain with sciatica 06/13/2019   MDD (major depressive disorder), single episode, moderate (HCC) 03/30/2019   GAD (generalized anxiety disorder) 03/30/2019   Cocaine use disorder, severe, in sustained remission (HCC) 03/30/2019   Opioid use disorder, moderate, in sustained remission (HCC) 03/30/2019   Tobacco user 03/30/2019   History of renal failure 09/02/2018   Seizure disorder (HCC) 09/02/2018   S/P AKA (above knee amputation), 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   Impotence 12/08/2017   Erectile dysfunction 12/08/2017   Chronic bilateral low back pain without sciatica 12/08/2017   Polysubstance abuse (HCC) 12/03/2017   Adjustment disorder with mixed anxiety and depressed mood 11/13/2017   Cardiogenic shock (HCC) 10/30/2017   Cardiac arrest (HCC) 10/30/2017   Past Medical History:  Diagnosis Date   Acute renal failure (ARF) (HCC)    History of    Aspiration pneumonia (HCC)    Hx of   Asthma    Bronchitis    Cardiac arrest (HCC)    hx of   Cardiogenic shock (HCC)    hx of   Drug overdose    Hernia cerebri (HCC)    History of acute respiratory failure    Hypertension    Neuropathy    Polysubstance abuse (HCC)    Rhabdomyolysis    Seizures (HCC)    Past Surgical History:  Procedure Laterality Date   AMPUTATION Right 11/03/2017   Procedure: KNEE DISARTICULATION;  Surgeon: Jama Cordella MATSU, MD;  Location: [facility];  Service: Vascular;  Laterality: Right;   AMPUTATION Right 11/10/2017   Procedure: AMPUTATION ABOVE KNEE;  Surgeon: Jama Cordella MATSU, MD;  Location: [facility];  Service:  Vascular;  Laterality: Right;   ankle/foot surgery with metal plates     CERVICAL SPINE SURGERY     CONDYLOMA EXCISION/FULGURATION N/A 03/19/2021   Procedure: CONDYLOMA REMOVAL WITH Co2 LASER;  Surgeon: Twylla Glendia BROCKS, MD;  Location: [facility];  Service: Urology;  Laterality:  N/A;   DIALYSIS/PERMA CATHETER INSERTION N/A 11/16/2017   Procedure: DIALYSIS/PERMA CATHETER INSERTION;  Surgeon: Marea Selinda RAMAN, MD;  Location: [facility];  Service: Cardiovascular;  Laterality: N/A;   DIALYSIS/PERMA CATHETER REMOVAL N/A 12/14/2017   Procedure: DIALYSIS/PERMA CATHETER REMOVAL;  Surgeon: Marea Selinda RAMAN, MD;  Location: [facility];  Service: Cardiovascular;  Laterality: N/A;   permacath to right side     Scheduled to be removed   Social History   Tobacco Use   Smoking status: Every Day    Average packs/day: 0.5 packs/day for 16.0 years (8.0 ttl pk-yrs)    Types: Cigarettes    Start date: 12/04/2003    Passive exposure: Current   Smokeless tobacco: Never  Vaping Use   Vaping status: Never Used  Substance Use Topics   Alcohol use: No   Drug use: Not Currently    Types: Cocaine, Methamphetamines    Comment: states none since last time when surgery was canceled   Family History  Problem Relation Age of Onset   Depression Mother    Bipolar disorder Mother    Early death Father        Motorcycle wreck   Cancer Maternal Grandfather        leukemia   Varicose Veins Neg Hx    Allergies  Allergen Reactions   Zoloft [Sertraline] Hives and Rash   Current Outpatient Medications on File Prior to Visit  Medication Sig Dispense Refill   albuterol  (VENTOLIN  HFA) 108 (90 Base) MCG/ACT inhaler Inhale 2 puffs into the lungs every 6 (six) hours as needed for wheezing or shortness of breath. 8 g 1   amLODipine  (NORVASC ) 5 MG tablet Take 1 tablet (5 mg total) by mouth daily. 90 tablet 1   budesonide-formoterol (SYMBICORT) 80-4.5 MCG/ACT inhaler Inhale 2 puffs into the lungs 2 (two) times  daily. 10.2 g 2   Buprenorphine HCl-Naloxone  HCl 8-2 MG FILM Place 1 each under the tongue 2 (two) times daily.     busPIRone  (BUSPAR ) 5 MG tablet Take 1 tablet (5 mg total) by mouth 2 (two) times daily. 60 tablet 1   divalproex  (DEPAKOTE ) 250 MG DR tablet Take 250 mg once daily for one week and then increase to 250 mg twice daily for one week and then increase to 250 mg in the morning and 500 mg     gabapentin  (NEURONTIN ) 600 MG tablet Take 600 mg by mouth 2 (two) times daily.     levocetirizine (XYZAL ) 5 MG tablet Take 1 tablet (5 mg total) by mouth every evening. 90 tablet 1   lisinopril  (ZESTRIL ) 5 MG tablet Take 1 tablet (5 mg total) by mouth daily. 90 tablet 3   Misc. Devices MISC Right leg Prosthetic. Dx: Right AKA 1 each 0   nortriptyline (PAMELOR) 50 MG capsule Take 1 capsule by mouth at bedtime.     oxyCODONE -acetaminophen  (PERCOCET) 5-325 MG tablet Take 1 tablet by mouth every 4 (four) hours as needed for severe pain (pain score 7-10). 8 tablet 0   triamcinolone  (NASACORT ) 55 MCG/ACT AERO nasal inhaler Place 2 sprays into the nose daily. 3 each 1   No current facility-administered medications on file prior to visit.    Review of Systems  Constitutional:  Positive for fatigue.  Genitourinary:  Positive for dysuria. Negative for flank pain and hematuria.       Urinary/prostate pain  Skin:        Feels like skin is wrinkling too much  Psychiatric/Behavioral:  Positive for sleep disturbance. The patient is nervous/anxious.        Objective:   Physical Exam Constitutional:      General: He is not in acute distress.    Appearance: Normal appearance. He is well-developed and normal weight. He is not ill-appearing or diaphoretic.  HENT:     Head: Normocephalic and atraumatic.  Eyes:     Conjunctiva/sclera: Conjunctivae normal.     Pupils: Pupils are equal, round, and reactive to light.  Neck:     Thyroid : No thyromegaly.     Vascular: No carotid bruit or JVD.  Cardiovascular:      Rate and Rhythm: Normal rate and regular rhythm.     Heart sounds: Normal heart sounds.     No gallop.  Pulmonary:     Effort: Pulmonary effort is normal. No respiratory distress.     Breath sounds: Normal breath sounds. No wheezing or rales.  Abdominal:     General: There is no distension or abdominal bruit.     Palpations: Abdomen is soft. There is no mass.     Tenderness: There is no abdominal tenderness. There is no right CVA tenderness, left CVA tenderness, guarding or rebound.  Musculoskeletal:     Cervical back: Normal range of motion and neck supple.     Right lower leg: No edema.     Left lower leg: No edema.  Lymphadenopathy:     Cervical: No cervical adenopathy.  Skin:    General: Skin is warm and dry.     Coloration: Skin is not pale.     Findings: No rash.  Neurological:     Mental Status: He is alert.     Coordination: Coordination normal.     Deep Tendon Reflexes: Reflexes are normal and symmetric. Reflexes normal.  Psychiatric:        Attention and Perception: Attention normal.        Mood and Affect: Mood is anxious.        Behavior: Behavior is not agitated or aggressive.     Comments: Seems sedated  Mildly slurred speech (unsure of his baseline)             Assessment & Plan:   Problem List Items Addressed This Visit       Other   Dysuria - Primary   Pt declined urinalysis when I told him that we could not prescribe xanas  I did not see mention of prostate problem in care everywhere   Call back and Er precautions noted in detail today        Adjustment disorder with mixed anxiety and depressed mood   Pt continues to demand xanax  in ER and here  Argued with me  I explained why we could not give it  Reviewed hospital records, lab results and studies in detail  (where they also declined it)   He declines further care and says he will not return to [facility] since he will not refill this  I encouraged him to keep appointment with  psychiatrist if he has one   Pt does seem somewhat sedated today but I am unsure of his baseline   No charge for visit today due to his dissatisfaction  Pt stated that girlfriend was here to drive him home

## 2024-03-30 NOTE — Assessment & Plan Note (Addendum)
 Pt continues to demand xanax  in ER and here  Argued with me  I explained why we could not give it  Reviewed hospital records, lab results and studies in detail  (where they also declined it)   He declines further care and says he will not return to [facility] since he will not refill this  I encouraged him to keep appointment with psychiatrist if he has one   Pt does seem somewhat sedated today but I am unsure of his baseline   No charge for visit today due to his dissatisfaction  Pt stated that girlfriend was here to drive him home

## 2024-03-30 NOTE — Patient Instructions (Addendum)
 I cannot give you any xanax  or other benzodiazapine medicine today   Since you are unsatisfied we will no charge for this visit

## 2024-03-30 NOTE — Telephone Encounter (Signed)
 Patient has expressed that he did not want to see me any longer. He had an appointment with another provider in office that was not favorable. I move to dismiss the patient from the practice

## 2024-03-30 NOTE — Assessment & Plan Note (Addendum)
 Pt declined urinalysis when I told him that we could not prescribe xanas  I did not see mention of prostate problem in care everywhere   Call back and Er precautions noted in detail today

## 2024-04-02 ENCOUNTER — Other Ambulatory Visit: Payer: Self-pay | Admitting: Nurse Practitioner

## 2024-04-02 DIAGNOSIS — F411 Generalized anxiety disorder: Secondary | ICD-10-CM

## 2024-04-06 ENCOUNTER — Encounter: Payer: Self-pay | Admitting: Gastroenterology

## 2024-04-13 ENCOUNTER — Encounter: Payer: Self-pay | Admitting: Gastroenterology

## 2024-04-13 ENCOUNTER — Encounter: Payer: Self-pay | Admitting: Anesthesiology

## 2024-04-13 ENCOUNTER — Encounter
Admission: RE | Disposition: A | Discharge status: Home / Self Care | Payer: Self-pay | Source: Home / Self Care | Attending: Gastroenterology

## 2024-04-13 ENCOUNTER — Ambulatory Visit: Payer: Self-pay | Admitting: Anesthesiology

## 2024-04-13 ENCOUNTER — Other Ambulatory Visit: Payer: Self-pay

## 2024-04-13 ENCOUNTER — Encounter: Payer: Self-pay | Admitting: Nurse Practitioner

## 2024-04-13 ENCOUNTER — Ambulatory Visit
Admission: RE | Admit: 2024-04-13 | Discharge: 2024-04-13 | Disposition: A | Discharge status: Home / Self Care | Attending: Gastroenterology | Admitting: Gastroenterology

## 2024-04-13 DIAGNOSIS — K3189 Other diseases of stomach and duodenum: Secondary | ICD-10-CM | POA: Insufficient documentation

## 2024-04-13 DIAGNOSIS — R194 Change in bowel habit: Secondary | ICD-10-CM | POA: Diagnosis not present

## 2024-04-13 DIAGNOSIS — J45909 Unspecified asthma, uncomplicated: Secondary | ICD-10-CM | POA: Diagnosis not present

## 2024-04-13 DIAGNOSIS — R1084 Generalized abdominal pain: Secondary | ICD-10-CM | POA: Insufficient documentation

## 2024-04-13 DIAGNOSIS — R140 Abdominal distension (gaseous): Secondary | ICD-10-CM | POA: Insufficient documentation

## 2024-04-13 DIAGNOSIS — Z89611 Acquired absence of right leg above knee: Secondary | ICD-10-CM | POA: Insufficient documentation

## 2024-04-13 DIAGNOSIS — I10 Essential (primary) hypertension: Secondary | ICD-10-CM | POA: Insufficient documentation

## 2024-04-13 DIAGNOSIS — R102 Pelvic and perineal pain: Secondary | ICD-10-CM | POA: Insufficient documentation

## 2024-04-13 DIAGNOSIS — F17210 Nicotine dependence, cigarettes, uncomplicated: Secondary | ICD-10-CM | POA: Insufficient documentation

## 2024-04-13 DIAGNOSIS — K208 Other esophagitis without bleeding: Secondary | ICD-10-CM | POA: Diagnosis not present

## 2024-04-13 DIAGNOSIS — R569 Unspecified convulsions: Secondary | ICD-10-CM | POA: Diagnosis not present

## 2024-04-13 DIAGNOSIS — F4323 Adjustment disorder with mixed anxiety and depressed mood: Secondary | ICD-10-CM | POA: Insufficient documentation

## 2024-04-13 HISTORY — PX: COLONOSCOPY: SHX5424

## 2024-04-13 HISTORY — DX: Adjustment disorder with mixed anxiety and depressed mood: F43.23

## 2024-04-13 HISTORY — PX: ESOPHAGOGASTRODUODENOSCOPY: SHX5428

## 2024-04-13 HISTORY — DX: Generalized anxiety disorder: F41.1

## 2024-04-13 SURGERY — COLONOSCOPY
Anesthesia: General

## 2024-04-13 MED ORDER — OMEPRAZOLE 40 MG PO CPDR: 40.0000 mg | DELAYED_RELEASE_CAPSULE | Freq: Every day | ORAL | 2 refills | Status: AC

## 2024-04-13 MED ORDER — MIDAZOLAM HCL 2 MG/2ML IJ SOLN
INTRAMUSCULAR | Status: DC | PRN
  Administered 2024-04-13 (×2): .5 mg via INTRAVENOUS
  Administered 2024-04-13: 1 mg via INTRAVENOUS

## 2024-04-13 MED ORDER — LIDOCAINE HCL (PF) 2 % IJ SOLN
INTRAMUSCULAR | Status: AC
  Filled 2024-04-13: qty 5

## 2024-04-13 MED ORDER — MIDAZOLAM HCL 2 MG/2ML IJ SOLN
INTRAMUSCULAR | Status: AC
  Filled 2024-04-13: qty 2

## 2024-04-13 MED ORDER — DEXMEDETOMIDINE HCL IN NACL 80 MCG/20ML IV SOLN
INTRAVENOUS | Status: DC | PRN
  Administered 2024-04-13: 8 ug via INTRAVENOUS
  Administered 2024-04-13: 12 ug via INTRAVENOUS

## 2024-04-13 MED ORDER — PROPOFOL 500 MG/50ML IV EMUL
INTRAVENOUS | Status: DC | PRN
  Administered 2024-04-13: 150 ug/kg/min via INTRAVENOUS

## 2024-04-13 MED ORDER — GLYCOPYRROLATE 0.2 MG/ML IJ SOLN
INTRAMUSCULAR | Status: AC
  Filled 2024-04-13: qty 1

## 2024-04-13 MED ORDER — SODIUM CHLORIDE 0.9 % IV SOLN
INTRAVENOUS | Status: DC
  Administered 2024-04-13: 500 mL via INTRAVENOUS

## 2024-04-13 MED ORDER — LIDOCAINE HCL (CARDIAC) PF 100 MG/5ML IV SOSY
PREFILLED_SYRINGE | INTRAVENOUS | Status: DC | PRN
  Administered 2024-04-13: 80 mg via INTRAVENOUS

## 2024-04-13 MED ORDER — PROPOFOL 10 MG/ML IV BOLUS
INTRAVENOUS | Status: DC | PRN
  Administered 2024-04-13: 50 mg via INTRAVENOUS
  Administered 2024-04-13: 30 mg via INTRAVENOUS

## 2024-04-13 NOTE — Transfer of Care (Signed)
 Immediate Anesthesia Transfer of Care Note  Patient: Joshua [name]  Procedure(s) Performed: COLONOSCOPY EGD (ESOPHAGOGASTRODUODENOSCOPY)  Patient Location: PACU  Anesthesia Type:General  Level of Consciousness: sedated  Airway & Oxygen Therapy: Patient Spontanous Breathing  Post-op Assessment: Report given to RN and Post -op Vital signs reviewed and stable  Post vital signs: Reviewed and stable  Last Vitals:  Vitals Value Taken Time  BP    Temp    Pulse    Resp    SpO2      Last Pain:  Vitals:   04/13/24 0845  TempSrc:   PainSc: Asleep      Patients Stated Pain Goal: 8 (04/13/24 0752)  Complications: No notable events documented.

## 2024-04-13 NOTE — H&P (Signed)
 Corinn JONELLE Brooklyn, MD [facility], DHIP [address]  [place], [place] [address]  Main: [phone] Fax:  [phone] Pager: [phone]   Primary Care Physician:  Wendee Lynwood HERO, NP Primary Gastroenterologist:  Dr. Corinn JONELLE Brooklyn  Pre-Procedure History & Physical: HPI:  Joshua [name] is a 33 y.o. male is here for an endoscopy and colonoscopy.   Past Medical History:  Diagnosis Date   Acute renal failure (ARF) (HCC)    History of    Adjustment disorder with mixed anxiety and depressed mood    Aspiration pneumonia (HCC)    Hx of   Asthma    Bronchitis    Cardiac arrest (HCC)    hx of   Cardiogenic shock (HCC)    hx of   Drug overdose    Generalized anxiety disorder    Hernia cerebri (HCC)    History of acute respiratory failure    Hypertension    Neuropathy    Polysubstance abuse (HCC)    Rhabdomyolysis    Seizures (HCC)     Past Surgical History:  Procedure Laterality Date   AMPUTATION Right 11/03/2017   Procedure: KNEE DISARTICULATION;  Surgeon: Jama Cordella MATSU, MD;  Location: [facility];  Service: Vascular;  Laterality: Right;   AMPUTATION Right 11/10/2017   Procedure: AMPUTATION ABOVE KNEE;  Surgeon: Jama Cordella MATSU, MD;  Location: [facility];  Service: Vascular;  Laterality: Right;   ankle/foot surgery with metal plates Bilateral    CERVICAL SPINE SURGERY     CONDYLOMA EXCISION/FULGURATION N/A 03/19/2021   Procedure: CONDYLOMA REMOVAL WITH Co2 LASER;  Surgeon: Twylla Glendia BROCKS, MD;  Location: [facility];  Service: Urology;  Laterality: N/A;   DIALYSIS/PERMA CATHETER INSERTION N/A 11/16/2017   Procedure: DIALYSIS/PERMA CATHETER INSERTION;  Surgeon: Marea Selinda RAMAN, MD;  Location: [facility];  Service: Cardiovascular;  Laterality: N/A;   DIALYSIS/PERMA CATHETER REMOVAL N/A 12/14/2017   Procedure: DIALYSIS/PERMA CATHETER REMOVAL;  Surgeon: Marea Selinda RAMAN, MD;  Location: [facility];  Service: Cardiovascular;  Laterality:  N/A;   permacath to right side     Scheduled to be removed    Prior to Admission medications   Medication Sig Start Date End Date Taking? Authorizing Provider  albuterol  (VENTOLIN  HFA) 108 (90 Base) MCG/ACT inhaler Inhale 2 puffs into the lungs every 6 (six) hours as needed for wheezing or shortness of breath. 03/10/24  Yes Wendee Lynwood HERO, NP  amLODipine  (NORVASC ) 5 MG tablet Take 1 tablet (5 mg total) by mouth daily. 03/10/24  Yes Wendee Lynwood HERO, NP  budesonide-formoterol (SYMBICORT) 80-4.5 MCG/ACT inhaler Inhale 2 puffs into the lungs 2 (two) times daily. 01/11/24  Yes Wendee Lynwood HERO, NP  busPIRone  (BUSPAR ) 5 MG tablet TAKE 1 TABLET BY MOUTH TWICE A DAY 04/04/24  Yes Wendee Lynwood HERO, NP  divalproex  (DEPAKOTE ) 250 MG DR tablet Take 250 mg once daily for one week and then increase to 250 mg twice daily for one week and then increase to 250 mg in the morning and 500 mg 11/04/23  Yes [provider]  gabapentin  (NEURONTIN ) 600 MG tablet Take 600 mg by mouth 2 (two) times daily.   Yes [provider]  levocetirizine (XYZAL ) 5 MG tablet Take 1 tablet (5 mg total) by mouth every evening. 01/07/24  Yes Copland, Jacques, MD  lisinopril  (ZESTRIL ) 5 MG tablet Take 1 tablet (5 mg total) by mouth daily. 09/25/23  Yes Ziglar, Susan K, MD  nortriptyline (PAMELOR) 50  MG capsule Take 1 capsule by mouth at bedtime. 12/03/23  Yes [provider]  triamcinolone  (NASACORT ) 55 MCG/ACT AERO nasal inhaler Place 2 sprays into the nose daily. 01/07/24  Yes Copland, Jacques, MD  Buprenorphine HCl-Naloxone  HCl 8-2 MG FILM Place 1 each under the tongue 2 (two) times daily. 01/28/24   [provider]  Misc. Devices MISC Right leg Prosthetic. Dx: Right AKA 05/05/19   Delbert Clam, MD  oxyCODONE -acetaminophen  (PERCOCET) 5-325 MG tablet Take 1 tablet by mouth every 4 (four) hours as needed for severe pain (pain score 7-10). Patient not taking: Reported on 04/13/2024 12/19/23   Jossie Artist POUR, MD     Allergies as of 04/05/2024 - Review Complete 03/30/2024  Allergen Reaction Noted   Zoloft [sertraline] Hives and Rash 02/06/2021    Family History  Problem Relation Age of Onset   Depression Mother    Bipolar disorder Mother    Early death Father        Motorcycle wreck   Cancer Maternal Grandfather        leukemia   Varicose Veins Neg Hx     Social History   Socioeconomic History   Marital status: Married    Spouse name: nohemi   Number of children: 0   Years of education: Not on file   Highest education level: 11th grade  Occupational History   Not on file  Tobacco Use   Smoking status: Every Day    Average packs/day: 0.5 packs/day for 16.0 years (8.0 ttl pk-yrs)    Types: Cigarettes    Start date: 12/04/2003    Passive exposure: Current   Smokeless tobacco: Never  Vaping Use   Vaping status: Some Days  Substance and Sexual Activity   Alcohol use: No   Drug use: Not Currently    Types: Cocaine, Methamphetamines    Comment: states none since last time when surgery was canceled   Sexual activity: Not on file  Other Topics Concern   Not on file  Social History Narrative   Pt is right handed   Lives in single story home with his mother   11th grade education   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   Overall Financial Resource Strain (CARDIA)    Difficulty of Paying Living Expenses: Hard  Food Insecurity: Food Insecurity Present (07/29/2023)   [facility] from [facility]   Hunger Vital Sign    Within the past 12 months, you worried that your food would run out before you got the money to buy more.: Sometimes true    Within the past 12 months, the food you bought just didn't last and you didn't have money to get more.: Sometimes true  Transportation Needs: Unmet Transportation Needs  (07/29/2023)   Received from [facility]   PRAPARE - Transportation    Lack of Transportation (Non-Medical): Yes    In the past 12 months, has lack of transportation kept you from medical appointments or from getting medications?: Yes  Physical Activity: Not on file  Stress: Not on file  Social Connections: Unknown (02/20/2022)   Received from [facility]   Social Network    Social Network: Not on file  Intimate Partner Violence: Not At Risk (11/17/2023)   Received from [facility]   Humiliation, Afraid, Rape, and Kick  questionnaire    Within the last year, have you been afraid of your partner or ex-partner?: No    Within the last year, have you been humiliated or emotionally abused in other ways by your partner or ex-partner?: No    Within the last year, have you been kicked, hit, slapped, or otherwise physically hurt by your partner or ex-partner?: No    Within the last year, have you been raped or forced to have any kind of sexual activity by your partner or ex-partner?: No    Review of Systems: See HPI, otherwise negative ROS  Physical Exam: BP 127/78   Pulse 76   Temp (!) 96.7 F (35.9 C) (Tympanic)   Resp 15   Ht 5' 10 (1.778 m)   Wt 90.5 kg   SpO2 97%   BMI 28.64 kg/m  General:   Alert,  pleasant and cooperative in NAD Head:  Normocephalic and atraumatic. Neck:  Supple; no masses or thyromegaly. Lungs:  Clear throughout to auscultation.    Heart:  Regular rate and rhythm. Abdomen:  Soft, nontender and nondistended. Normal bowel sounds, without guarding, and without rebound.   Neurologic:  Alert and  oriented x4;  grossly normal neurologically.  Impression/Plan: Oneil DELENA Pack is here for an endoscopy and colonoscopy to be performed for chronic abdominal bloating, pelvic pain, change in bowel habits   Risks, benefits, limitations, and alternatives regarding  endoscopy and colonoscopy have been reviewed with the patient.  Questions have been  answered.  All parties agreeable.   Corinn Brooklyn, MD  04/13/2024, 8:18 AM

## 2024-04-13 NOTE — Telephone Encounter (Signed)
-----   Message from Lynwood CHRISTELLA Crandall, NP sent at 03/30/2024  1:57 PM EDT -----

## 2024-04-13 NOTE — Anesthesia Postprocedure Evaluation (Signed)
 Anesthesia Post Note  Patient: Joshua [name]  Procedure(s) Performed: COLONOSCOPY EGD (ESOPHAGOGASTRODUODENOSCOPY)  Patient location during evaluation: PACU Anesthesia Type: General Level of consciousness: awake and alert, oriented and patient cooperative Pain management: pain level controlled Vital Signs Assessment: post-procedure vital signs reviewed and stable Respiratory status: spontaneous breathing, nonlabored ventilation and respiratory function stable Cardiovascular status: blood pressure returned to baseline and stable Postop Assessment: adequate PO intake Anesthetic complications: no   No notable events documented.   Last Vitals:  Vitals:   04/13/24 0906 04/13/24 0917  BP: 105/70 109/86  Pulse: 74 76  Resp: 11 14  Temp: (!) 36.1 C   SpO2: 100% 100%    Last Pain:  Vitals:   04/13/24 0906  TempSrc: Temporal  PainSc: 5                  Jesicca Dipierro

## 2024-04-13 NOTE — Anesthesia Preprocedure Evaluation (Addendum)
 Anesthesia Evaluation  Patient identified by MRN, date of birth, ID band Patient awake    Reviewed: Allergy & Precautions, NPO status , Patient's Chart, lab work & pertinent test results  History of Anesthesia Complications Negative for: history of anesthetic complications  Airway Mallampati: IV   Neck ROM: Full    Dental  (+) Chipped   Pulmonary asthma , Current Smoker (1/2 ppd)Patient did not abstain from smoking.   Pulmonary exam normal breath sounds clear to auscultation       Cardiovascular hypertension, Normal cardiovascular exam Rhythm:Regular Rate:Normal  ECG 03/18/24: NSR   Neuro/Psych Seizures - (last sz 1 month ago),  PSYCHIATRIC DISORDERS Anxiety Depression    Hx polysubstance use disorder including amphetamines and benzodiazepines, no use in months  Neuromuscular disease (neuropathy)    GI/Hepatic ,GERD  ,,  Endo/Other  negative endocrine ROS    Renal/GU negative Renal ROS     Musculoskeletal S/p AKA   Abdominal   Peds  Hematology negative hematology ROS (+)   Anesthesia Other Findings   Reproductive/Obstetrics                              Anesthesia Physical Anesthesia Plan  ASA: 3  Anesthesia Plan: General   Post-op Pain Management:    Induction: Intravenous  PONV Risk Score and Plan: 1 and Propofol  infusion, TIVA and Treatment may vary due to age or medical condition  Airway Management Planned: Natural Airway  Additional Equipment:   Intra-op Plan:   Post-operative Plan:   Informed Consent: I have reviewed the patients History and Physical, chart, labs and discussed the procedure including the risks, benefits and alternatives for the proposed anesthesia with the patient or authorized representative who has indicated his/her understanding and acceptance.       Plan Discussed with: CRNA  Anesthesia Plan Comments: (LMA/GETA backup discussed.  Patient  consented for risks of anesthesia including but not limited to:  - adverse reactions to medications - damage to eyes, teeth, lips or other oral mucosa - nerve damage due to positioning  - sore throat or hoarseness - damage to heart, brain, nerves, lungs, other parts of body or loss of life  Informed patient about role of CRNA in peri- and intra-operative care.  Patient voiced understanding.)         Anesthesia Quick Evaluation

## 2024-04-13 NOTE — Op Note (Signed)
 [facility] Patient Name: Joshua [name] Procedure Date: 04/13/2024 8:19 AM MRN: [id number] Account #: [id number] Date of Birth: Dec 31, 1990 Admit Type: Outpatient Age: 33 Room: [facility] ENDO ROOM 4 Gender: Male Note Status: Finalized Instrument Name: Upper GI Scope [id number] Procedure:             Upper GI endoscopy Indications:           Generalized abdominal pain Providers:             Corinn Jess Brooklyn MD, MD Referring MD:          Lynwood HERO. Wendee (Referring MD) Medicines:             General Anesthesia Complications:         No immediate complications. Estimated blood loss: None. Procedure:             Pre-Anesthesia Assessment:                        - Prior to the procedure, a History and Physical was                         performed, and patient medications and allergies were                         reviewed. The patient is competent. The risks and                         benefits of the procedure and the sedation options and                         risks were discussed with the patient. All questions                         were answered and informed consent was obtained.                         Patient identification and proposed procedure were                         verified by the physician, the nurse, the                         anesthesiologist, the anesthetist and the technician                         in the pre-procedure area in the procedure room in the                         endoscopy suite. Mental Status Examination: alert and                         oriented. Airway Examination: normal oropharyngeal                         airway and neck mobility. Respiratory Examination:                         clear to auscultation. CV Examination: normal.  Prophylactic Antibiotics: The patient does not require                         prophylactic antibiotics. Prior Anticoagulants: The                         patient has taken  no anticoagulant or antiplatelet                         agents. ASA Grade Assessment: III - A patient with                         severe systemic disease. After reviewing the risks and                         benefits, the patient was deemed in satisfactory                         condition to undergo the procedure. The anesthesia                         plan was to use general anesthesia. Immediately prior                         to administration of medications, the patient was                         re-assessed for adequacy to receive sedatives. The                         heart rate, respiratory rate, oxygen saturations,                         blood pressure, adequacy of pulmonary ventilation, and                         response to care were monitored throughout the                         procedure. The physical status of the patient was                         re-assessed after the procedure.                        After obtaining informed consent, the endoscope was                         passed under direct vision. Throughout the procedure,                         the patient's blood pressure, pulse, and oxygen                         saturations were monitored continuously. The Endoscope                         was introduced through the mouth, and advanced to the  second part of duodenum. The upper GI endoscopy was                         accomplished without difficulty. The patient tolerated                         the procedure well. Findings:      The duodenal bulb and second portion of the duodenum were normal.       Biopsies were taken with a cold forceps for histology.      Multiple dispersed medium erosions with no bleeding and no stigmata of       recent bleeding were found in the gastric body. Biopsies were taken with       a cold forceps for histology.      The cardia and gastric fundus were normal on retroflexion.      The incisura and  gastric antrum were normal. Biopsies were taken with a       cold forceps for histology.      LA Grade A (one or more mucosal breaks less than 5 mm, not extending       between tops of 2 mucosal folds) esophagitis with no bleeding was found       at the gastroesophageal junction.      The examined esophagus was normal. Biopsies were taken with a cold       forceps for histology. Impression:            - Normal duodenal bulb and second portion of the                         duodenum. Biopsied.                        - Erosive gastropathy with no bleeding and no stigmata                         of recent bleeding. Biopsied.                        - Normal incisura and antrum. Biopsied.                        - LA Grade A esophagitis with no bleeding.                        - Normal esophagus. Biopsied. Recommendation:        - Await pathology results.                        - Follow an antireflux regimen.                        - Use a proton pump inhibitor PO daily for 3 months.                        - Proceed with colonoscopy as scheduled                        See colonoscopy report Procedure Code(s):     --- Professional ---  56760, Esophagogastroduodenoscopy, flexible,                         transoral; with biopsy, single or multiple Diagnosis Code(s):     --- Professional ---                        K31.89, Other diseases of stomach and duodenum                        K20.90, Esophagitis, unspecified without bleeding                        R10.84, Generalized abdominal pain CPT copyright 2022 American Medical Association. All rights reserved. The codes documented in this report are preliminary and upon coder review may  be revised to meet current compliance requirements. Dr. Corinn Brooklyn Corinn Jess Brooklyn MD, MD 04/13/2024 8:35:47 AM This report has been signed electronically. Number of Addenda: 0 Note Initiated On: 04/13/2024 8:19 AM Estimated Blood Loss:   Estimated blood loss: none.      [facility]

## 2024-04-13 NOTE — Op Note (Signed)
 [facility] Patient Name: Joshua [name] Procedure Date: 04/13/2024 8:17 AM MRN: [id number] Account #: [id number] Date of Birth: 04-Aug-1991 Admit Type: Outpatient Age: 33 Room: [facility] ENDO ROOM 4 Gender: Male Note Status: Finalized Instrument Name: Colon Scope [id number] Procedure:             Colonoscopy Indications:           This is the patient's first colonoscopy, Generalized                         abdominal pain, Change in bowel habits Providers:             Corinn Jess Brooklyn MD, MD Referring MD:          Lynwood HERO. Wendee (Referring MD) Medicines:             General Anesthesia Complications:         No immediate complications. Estimated blood loss: None. Procedure:             Pre-Anesthesia Assessment:                        - Prior to the procedure, a History and Physical was                         performed, and patient medications and allergies were                         reviewed. The patient is competent. The risks and                         benefits of the procedure and the sedation options and                         risks were discussed with the patient. All questions                         were answered and informed consent was obtained.                         Patient identification and proposed procedure were                         verified by the physician, the nurse, the                         anesthesiologist, the anesthetist and the technician                         in the pre-procedure area in the procedure room in the                         endoscopy suite. Mental Status Examination: alert and                         oriented. Airway Examination: normal oropharyngeal                         airway and neck mobility. Respiratory Examination:  clear to auscultation. CV Examination: normal.                         Prophylactic Antibiotics: The patient does not require                         prophylactic  antibiotics. Prior Anticoagulants: The                         patient has taken no anticoagulant or antiplatelet                         agents. ASA Grade Assessment: III - A patient with                         severe systemic disease. After reviewing the risks and                         benefits, the patient was deemed in satisfactory                         condition to undergo the procedure. The anesthesia                         plan was to use general anesthesia. Immediately prior                         to administration of medications, the patient was                         re-assessed for adequacy to receive sedatives. The                         heart rate, respiratory rate, oxygen saturations,                         blood pressure, adequacy of pulmonary ventilation, and                         response to care were monitored throughout the                         procedure. The physical status of the patient was                         re-assessed after the procedure.                        After obtaining informed consent, the colonoscope was                         passed under direct vision. Throughout the procedure,                         the patient's blood pressure, pulse, and oxygen                         saturations were monitored continuously. The  Colonoscope was introduced through the anus and                         advanced to the the cecum, identified by appendiceal                         orifice and ileocecal valve. The colonoscopy was                         extremely difficult due to inadequate bowel prep. The                         patient tolerated the procedure well. The quality of                         the bowel preparation was poor. The ileocecal valve,                         appendiceal orifice, and rectum were photographed. Findings:      The perianal and digital rectal examinations were normal. Pertinent       negatives  include normal sphincter tone and no palpable rectal lesions.      A large amount of semi-solid stool was found in the transverse colon, in       the ascending colon and in the cecum, precluding visualization.      Normal mucosa was found in the left colon.      The retroflexed view of the distal rectum and anal verge was normal and       showed no anal or rectal abnormalities. Impression:            - Preparation of the colon was poor.                        - Stool in the transverse colon, in the ascending                         colon and in the cecum.                        - Normal mucosa in the left colon.                        - The distal rectum and anal verge are normal on                         retroflexion view.                        - No specimens collected. Recommendation:        - Discharge patient to home (with escort).                        - Resume previous diet today.                        - Continue present medications.                        - Return to my office as previously  scheduled. Procedure Code(s):     --- Professional ---                        [phone], Colonoscopy, flexible; diagnostic, including                         collection of specimen(s) by brushing or washing, when                         performed (separate procedure) Diagnosis Code(s):     --- Professional ---                        R10.84, Generalized abdominal pain                        R19.4, Change in bowel habit CPT copyright 2022 American Medical Association. All rights reserved. The codes documented in this report are preliminary and upon coder review may  be revised to meet current compliance requirements. Dr. Corinn Brooklyn Corinn Jess Brooklyn MD, MD 04/13/2024 8:44:43 AM This report has been signed electronically. Number of Addenda: 0 Note Initiated On: 04/13/2024 8:17 AM Scope Withdrawal Time: 0 hours 1 minute 54 seconds  Total Procedure Duration: 0 hours 5 minutes 8 seconds   Estimated Blood Loss:  Estimated blood loss: none.      [facility]

## 2024-04-14 ENCOUNTER — Ambulatory Visit: Payer: Self-pay | Admitting: Gastroenterology

## 2024-04-14 LAB — SURGICAL PATHOLOGY

## 2024-04-16 ENCOUNTER — Other Ambulatory Visit: Payer: Self-pay | Admitting: Nurse Practitioner

## 2024-05-02 ENCOUNTER — Ambulatory Visit: Payer: Self-pay

## 2024-05-02 NOTE — Telephone Encounter (Signed)
 FYI Only or Action Required?: Action required by provider: called 911.  Patient was last seen in primary care on 03/30/2024 by Randeen Laine LABOR, MD.  Called Nurse Triage reporting infection.  Symptoms began several days ago.  Interventions attempted: Nothing.  Symptoms are: gradually worsening.  Triage Disposition: No disposition on file.  Patient/caregiver understands and will follow disposition?:      Copied from CRM [id number]. Topic: Clinical - Red Word Triage >> May 02, 2024  3:48 PM Suzen RAMAN wrote: Red Word that prompted transfer to Nurse Triage: excessive sweating, prostate leaking,memory loss, numbness in head & hand and muscle pain. Would like to transfer care to another provider within the [facility]) Answer Assessment - Initial Assessment Questions Called 911. Unable to triage patient; patient exhibits mental health disorder traits possibly r/t to infection. EMS arrived.   1. ONSET: When did the sweating start?      Entire bed was wet. It's been going on for a week.  2. FEVER: Have you been having fevers? If Yes, ask: What is your temperature, how was it measured, and when did it start?     Denies fever and chills, n/v Hx: seizures,  Denies chest pain, reports back of head going numb, electric shots to brain,   Patient reports Difficulty breathing, memory problem,  Do you feel safe? Saying house isn't safe, house falling apart, demonic spirits.  Patient denies suidical ideation or homicidal ideation.  Unable to complete triage with patient; patient rambling and displayed paranoid, mental health concerns.Girlfriend reports that's not normal for him, he got like that before when he had an infection.   Patient's girlfriend, Leotis Din reports he need to be seen he has infection in his body again. Bronchitis makes him get delusional; Sweating, has not checked fever, greenish brown color. Issue with prostate; amoxicillin  given and completed,  didn't work; couple of weeks ago. Prostate still leaking fluids; dark brown color, scrotom hurts.  Denies V/D, reports nausea and possible dehydration.  Protocols used: [facility]

## 2024-05-03 ENCOUNTER — Telehealth: Payer: Self-pay | Admitting: *Deleted

## 2024-05-03 NOTE — Telephone Encounter (Signed)
 Will route to management since pt has been dismissed

## 2024-05-03 NOTE — Telephone Encounter (Signed)
 Copied from CRM #[id number]. Topic: Appointments - Scheduling Inquiry for Clinic >> May 03, 2024  1:29 PM Mesmerise C wrote: Reason for CRM: Patient requesting to switch providers and be with Dr. Bennett instead showing he's dismissed when trying to schedule would like asap

## 2024-05-03 NOTE — Telephone Encounter (Signed)
 Attempted to reach pt by calling number listed in chart and number listed on DPR. Number listed on DPR not in service and no answer on phone in chart. Sent MyChart message advising pt we can see him for an urgent medical need until 9/20. He may not establish care with any provider at [facility].

## 2024-05-03 NOTE — Telephone Encounter (Signed)
 Agree with being evaluated. Patient has been dismissed from the practice and needs to establish care else where

## 2024-05-04 NOTE — Telephone Encounter (Signed)
 I am more than happy to see him. In the triage note he wanted to switch providers. That was the reason I said he as been discharged

## 2024-05-08 ENCOUNTER — Emergency Department

## 2024-05-08 ENCOUNTER — Emergency Department: Admission: EM | Admit: 2024-05-08 | Discharge: 2024-05-08 | Disposition: A | Discharge status: Home / Self Care

## 2024-05-08 ENCOUNTER — Other Ambulatory Visit: Payer: Self-pay

## 2024-05-08 ENCOUNTER — Encounter: Payer: Self-pay | Admitting: Emergency Medicine

## 2024-05-08 DIAGNOSIS — F1920 Other psychoactive substance dependence, uncomplicated: Secondary | ICD-10-CM | POA: Insufficient documentation

## 2024-05-08 DIAGNOSIS — F0390 Unspecified dementia without behavioral disturbance: Secondary | ICD-10-CM | POA: Insufficient documentation

## 2024-05-08 DIAGNOSIS — R4182 Altered mental status, unspecified: Secondary | ICD-10-CM | POA: Diagnosis present

## 2024-05-08 DIAGNOSIS — R059 Cough, unspecified: Secondary | ICD-10-CM | POA: Diagnosis not present

## 2024-05-08 DIAGNOSIS — I10 Essential (primary) hypertension: Secondary | ICD-10-CM | POA: Insufficient documentation

## 2024-05-08 DIAGNOSIS — R7309 Other abnormal glucose: Secondary | ICD-10-CM | POA: Insufficient documentation

## 2024-05-08 LAB — URINE DRUG SCREEN, QUALITATIVE (ARMC ONLY)
Amphetamines, Ur Screen: POSITIVE — AB
Barbiturates, Ur Screen: NOT DETECTED
Benzodiazepine, Ur Scrn: POSITIVE — AB
Cannabinoid 50 Ng, Ur ~~LOC~~: POSITIVE — AB
Cocaine Metabolite,Ur ~~LOC~~: NOT DETECTED
MDMA (Ecstasy)Ur Screen: NOT DETECTED
Methadone Scn, Ur: NOT DETECTED
Opiate, Ur Screen: NOT DETECTED
Phencyclidine (PCP) Ur S: NOT DETECTED
Tricyclic, Ur Screen: POSITIVE — AB

## 2024-05-08 LAB — URINALYSIS, ROUTINE W REFLEX MICROSCOPIC
Bilirubin Urine: NEGATIVE
Glucose, UA: NEGATIVE mg/dL
Hgb urine dipstick: NEGATIVE
Ketones, ur: NEGATIVE mg/dL
Leukocytes,Ua: NEGATIVE
Nitrite: NEGATIVE
Protein, ur: NEGATIVE mg/dL
Specific Gravity, Urine: 1.028 (ref 1.005–1.030)
pH: 5 (ref 5.0–8.0)

## 2024-05-08 LAB — CBC
HCT: 41.3 % (ref 39.0–52.0)
Hemoglobin: 14.5 g/dL (ref 13.0–17.0)
MCH: 30.2 pg (ref 26.0–34.0)
MCHC: 35.1 g/dL (ref 30.0–36.0)
MCV: 86 fL (ref 80.0–100.0)
Platelets: 323 K/uL (ref 150–400)
RBC: 4.8 MIL/uL (ref 4.22–5.81)
RDW: 11.8 % (ref 11.5–15.5)
WBC: 10.2 K/uL (ref 4.0–10.5)
nRBC: 0 % (ref 0.0–0.2)

## 2024-05-08 LAB — COMPREHENSIVE METABOLIC PANEL WITH GFR
ALT: 14 U/L (ref 0–44)
AST: 20 U/L (ref 15–41)
Albumin: 4.5 g/dL (ref 3.5–5.0)
Alkaline Phosphatase: 44 U/L (ref 38–126)
Anion gap: 11 (ref 5–15)
BUN: 16 mg/dL (ref 6–20)
CO2: 24 mmol/L (ref 22–32)
Calcium: 9 mg/dL (ref 8.9–10.3)
Chloride: 101 mmol/L (ref 98–111)
Creatinine, Ser: 1.09 mg/dL (ref 0.61–1.24)
GFR, Estimated: >60 mL/min (ref >60)
Glucose, Bld: 116 mg/dL — ABNORMAL HIGH (ref 70–99)
Potassium: 3.9 mmol/L (ref 3.5–5.1)
Sodium: 136 mmol/L (ref 135–145)
Total Bilirubin: 0.8 mg/dL (ref 0.0–1.2)
Total Protein: 8 g/dL (ref 6.5–8.1)

## 2024-05-08 LAB — CBG MONITORING, ED: Glucose-Capillary: 122 mg/dL — ABNORMAL HIGH (ref 70–99)

## 2024-05-08 LAB — RESP PANEL BY RT-PCR (RSV, FLU A&B, COVID)  RVPGX2
Influenza A by PCR: NEGATIVE
Influenza B by PCR: NEGATIVE
Resp Syncytial Virus by PCR: NEGATIVE
SARS Coronavirus 2 by RT PCR: NEGATIVE

## 2024-05-08 LAB — ETHANOL: Alcohol, Ethyl (B): <15 mg/dL (ref <15)

## 2024-05-08 NOTE — Discharge Instructions (Addendum)
 Your testing today was overall reassuring.  Follow-up with your primary care doctor for further evaluation of your symptoms.  Return to the ER for new or worsening symptoms.

## 2024-05-08 NOTE — ED Triage Notes (Signed)
 Patient states I have dementia, I think I'm in the next stage of dementia.  I feel like I'm in a race car and have a bunch of air blowing at me.  Patient's visitor thinks he has an infection because confusion is a sign of infection.  Patient's visitor thinks patient has bronchitis because he's been coughing with yellow phlegm x 2 weeks. Patient laughing in triage, flailing arms around.

## 2024-05-08 NOTE — ED Notes (Signed)
 Pt and girlfriend are sleeping.

## 2024-05-08 NOTE — ED Provider Notes (Signed)
 Care of this patient assumed from prior physician at 0730 pending metabolization and anticipated discharge. Please see prior physician note for further details.  Briefly this is a 33 year old male who presented with poor responsiveness.  Labs with reassuring CBC, CMP, negative EtOH and viral swab.  Urine without evidence of infection.  UDS positive for multiple substances including TCAs, amphetamines, THC, benzodiazepines.  CXR without focal consolidation.  CT head without acute findings.  Patient remained somnolent for several hours, but did have improving mental status.  On reassessment he is awake, able to tell me his name.  Expressed concerns about long-term memory loss, no acute symptoms.  Discussed results of workup and patient is reassured.  Will give information to follow with primary care.  Do think patient is stable for discharge home.  Strict return precautions provided.   Levander Slate, MD 05/08/24 [phone]

## 2024-05-08 NOTE — ED Notes (Signed)
 PT concerned about cough and green snot and wanting antibiotic. EDP made aware and came to room to speak with pt. PT IV removed, pt wheeled outside to wait for ride to take him home.

## 2024-05-08 NOTE — ED Provider Notes (Signed)
 [facility] Provider Note    Event Date/Time   First MD Initiated Contact with Patient 05/08/24 0127     (approximate)   History   Altered Mental Status and Cough  Patient states I have dementia, I think I'm in the next stage of dementia.  I feel like I'm in a race car and have a bunch of air blowing at me.  Patient's visitor thinks he has an infection because confusion is a sign of infection.  Patient's visitor thinks patient has bronchitis because he's been coughing with yellow phlegm x 2 weeks. Patient laughing in triage, flailing arms around.    HPI JEFTE CARITHERS is a 33 y.o. male PMH polysubstance use, hypertension, seizure-like activity, right AKA, anxiety presents for unclear complaint -Patient was reportedly depression concerned about the possibility of dementia at triage and his partner at bedside notes that he has been coughing some recently.  Notes that he is intermittently less interactive than usual.  Patient was reportedly laughing and interactive at triage. -At time of my evaluation, patient is very sleepy.  It is about 2 AM.  Difficult to arouse, does open eyes and follows some basic commands though not responding to questions.  Is moving all extremities spontaneously. -Partner says she does not know if he uses any recreational drugs -No preceding trauma     Physical Exam   Triage Vital Signs: ED Triage Vitals [05/08/24 0012]  Encounter Vitals Group     BP 107/82     Girls Systolic BP Percentile      Girls Diastolic BP Percentile      Boys Systolic BP Percentile      Boys Diastolic BP Percentile      Pulse Rate 96     Resp 20     Temp 98.2 F (36.8 C)     Temp src      SpO2 97 %     Weight 200 lb 9.9 oz (91 kg)     Height      Head Circumference      Peak Flow      Pain Score 10     Pain Loc      Pain Education      Exclude from Growth Chart     Most recent vital signs: Vitals:   05/08/24 0012 05/08/24 0400  BP: 107/82  106/66  Pulse: 96 79  Resp: 20 18  Temp: 98.2 F (36.8 C) 97.9 F (36.6 C)  SpO2: 97% 95%     General: Awake, no distress.  HEENT: Normocephalic, atraumatic, no meningismus, pupils somewhat dilated bilaterally CV:  Good peripheral perfusion. RRR, RP 2+ Resp:  Normal effort. CTAB Abd:  No distention. Nontender to deep palpation throughout Neuro:  Sleepy but arousable although does not respond to questions, does follow some basic commands, does move all extremities spontaneously, no focal motor deficit appreciated Other:  S/p R AKA   ED Results / Procedures / Treatments   Labs (all labs ordered are listed, but only abnormal results are displayed) Labs Reviewed  COMPREHENSIVE METABOLIC PANEL WITH GFR - Abnormal; Notable for the following components:      Result Value   Glucose, Bld 116 (*)    All other components within normal limits  URINALYSIS, ROUTINE W REFLEX MICROSCOPIC - Abnormal; Notable for the following components:   Color, Urine YELLOW (*)    APPearance CLEAR (*)    All other components within normal limits  URINE DRUG SCREEN, QUALITATIVE (  [facility] ONLY) - Abnormal; Notable for the following components:   Tricyclic, Ur Screen POSITIVE (*)    Amphetamines, Ur Screen POSITIVE (*)    Cannabinoid 50 Ng, Ur [place] POSITIVE (*)    Benzodiazepine, Ur Scrn POSITIVE (*)    All other components within normal limits  CBG MONITORING, ED - Abnormal; Notable for the following components:   Glucose-Capillary 122 (*)    All other components within normal limits  RESP PANEL BY RT-PCR (RSV, FLU A&B, COVID)  [id number]  CBC  ETHANOL     EKG  N/a   RADIOLOGY Radiology interpreted by myself and radiology report reviewed.  No acute pathology identified.    PROCEDURES:  Critical Care performed: No  Procedures   MEDICATIONS ORDERED IN ED: Medications - No data to display   IMPRESSION / MDM / ASSESSMENT AND PLAN / ED COURSE  I reviewed the triage vital signs and the nursing  notes.                              DDX/MDM/AP: Differential diagnosis includes, but is not limited to, substance intoxication/washout, highly doubt acute intracranial pathology, do not clinically suspect meningitis.  Consider electrolyte abnormality.  With regards to his recent cough, consider underlying pneumonia or viral syndrome including COVID-19 or influenza.  Given patient is such a poor historian and is more difficult to arouse than anticipated, we will get a screening CT head out of abundance of precaution.  Plan: - Labs - CT head - Chest x-ray - Reassess, allow time to metabolize  Patient's presentation is most consistent with acute presentation with potential threat to life or bodily function.  The patient is on the cardiac monitor to evaluate for evidence of arrhythmia and/or significant heart rate changes.  ED course below.  Workup unremarkable beyond multiple positive substances on UDS.  Presentation most consistent with substance use washout.  Mentation is slowly improving, workup unremarkable including CT head.  I believe will require further time to metabolize.  Signed out to oncoming ED provider pending further metabolization and reassessment.  Clinical Course as of 05/08/24 0735  Sun May 08, 2024  0323 [facility]: IMPRESSION: No acute intracranial abnormality noted.   [MM]  0323 CXR: IMPRESSION: 1. No acute process.   [MM]  0348 Etoh undetectable [MM]  0407 UA w/ no e/o infxn [MM]  0416 Tricyclic, Ur Screen(!): POSITIVE [MM]  0417 Amphetamines, Ur Screen(!): POSITIVE [MM]  0417 Cannabinoid 50 Ng, Ur [place](!): POSITIVE [MM]  0417 Benzodiazepine, Ur Scrn(!): POSITIVE [MM]  0417 UDS broadly positive.   [MM]  0447 Viral swab neg [MM]  0615 Patient reevaluated, is more easily arousable, does respond to some basic questions though then falls immediately back to sleep.  Not awake enough to ambulate or p.o. challenge.  Presentation most consistent with meth or other  substance washout.  Will allow further time to metabolize prior to disposition decision. [MM]    Clinical Course User Index [MM] Clarine Ozell LABOR, MD     FINAL CLINICAL IMPRESSION(S) / ED DIAGNOSES   Final diagnoses:  Altered mental status, unspecified altered mental status type  Polysubstance use disorder     Rx / DC Orders   ED Discharge Orders     None        Note:  This document was prepared using Dragon voice recognition software and may include unintentional dictation errors.   Clarine Ozell LABOR, MD 05/08/24 [phone]

## 2024-05-08 NOTE — ED Notes (Signed)
 Pt awaken to get temperature. Pt followed commands. Requested bed to go down to increase comfort. Visitor at bedside. Call bell in reach, bed in lowest locked position. Lights off no other request at this time.

## 2024-05-08 NOTE — ED Notes (Signed)
 Pt continues to sleep.

## 2024-05-10 ENCOUNTER — Ambulatory Visit (INDEPENDENT_AMBULATORY_CARE_PROVIDER_SITE_OTHER): Admitting: Family Medicine

## 2024-05-10 ENCOUNTER — Ambulatory Visit: Admitting: Family Medicine

## 2024-05-10 ENCOUNTER — Encounter: Payer: Self-pay | Admitting: Family Medicine

## 2024-05-10 VITALS — BP 114/72 | HR 95 | Temp 98.6°F | Ht 70.0 in | Wt 196.1 lb

## 2024-05-10 DIAGNOSIS — F1910 Other psychoactive substance abuse, uncomplicated: Secondary | ICD-10-CM | POA: Diagnosis not present

## 2024-05-10 DIAGNOSIS — Z720 Tobacco use: Secondary | ICD-10-CM

## 2024-05-10 DIAGNOSIS — J019 Acute sinusitis, unspecified: Secondary | ICD-10-CM | POA: Diagnosis not present

## 2024-05-10 DIAGNOSIS — J683 Other acute and subacute respiratory conditions due to chemicals, gases, fumes and vapors: Secondary | ICD-10-CM | POA: Insufficient documentation

## 2024-05-10 DIAGNOSIS — R251 Tremor, unspecified: Secondary | ICD-10-CM | POA: Insufficient documentation

## 2024-05-10 DIAGNOSIS — F321 Major depressive disorder, single episode, moderate: Secondary | ICD-10-CM

## 2024-05-10 MED ORDER — AZITHROMYCIN 250 MG PO TABS: ORAL_TABLET | ORAL | 0 refills | Status: DC

## 2024-05-10 MED ORDER — FLUTICASONE-SALMETEROL 100-50 MCG/ACT IN AEPB: 1.0000 | INHALATION_SPRAY | Freq: Two times a day (BID) | RESPIRATORY_TRACT | 3 refills | Status: AC

## 2024-05-10 NOTE — Assessment & Plan Note (Signed)
 Discussed with patient how this can affect memory and cause other neurologic symptoms. Advised him to stop illegal substances and to only use current prescription medications.

## 2024-05-10 NOTE — Assessment & Plan Note (Signed)
 Encouraged patient to quit smoking as this is [place] contributing to respiratory issues.

## 2024-05-10 NOTE — Patient Instructions (Addendum)
 B12 1000 mcg daily.  Complete course of antibiotics. Change Symbicort to Advair . Call Psychiatry [facility] [phone]
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
## 2024-05-10 NOTE — Assessment & Plan Note (Signed)
 Chronic issue, offered B12 testing as was not done in ER.  Encouraged patient to at least take B12 supplement 1000 mcg daily.

## 2024-05-10 NOTE — Assessment & Plan Note (Signed)
 Chronic, discussed how mental health issues and substance abuse can affect memory and neurologic system. Phone number given to patient for recent psychiatric referral for him to call to reschedule.  Encouraged him to be open to a significant weight.  Encouraged him to use his smart phone to set an alarm prior to the day of the office visit.

## 2024-05-10 NOTE — Assessment & Plan Note (Signed)
 Chronic, patient states that Symbicort does not help as much is Advair .  I have resent Advair  for patient to return to.  No clear acute exacerbation.  Patient does have albuterol  2 puffs every 6 hours as needed to use for wheeze.  Return and ER precautions provided.

## 2024-05-10 NOTE — Assessment & Plan Note (Signed)
 Acute, given symptoms greater than 7 to 10 days will treat with antibiotic for possible viral infection now with bacterial superinfection. Azithromycin  course sent to pharmacy.

## 2024-05-17 ENCOUNTER — Ambulatory Visit (INDEPENDENT_AMBULATORY_CARE_PROVIDER_SITE_OTHER): Admitting: Urology

## 2024-05-17 ENCOUNTER — Encounter: Payer: Self-pay | Admitting: Urology

## 2024-05-17 VITALS — BP 125/77 | HR 85 | Ht 70.0 in | Wt 196.2 lb

## 2024-05-17 DIAGNOSIS — N5082 Scrotal pain: Secondary | ICD-10-CM

## 2024-05-17 DIAGNOSIS — A630 Anogenital (venereal) warts: Secondary | ICD-10-CM

## 2024-05-17 MED ORDER — ALFUZOSIN HCL ER 10 MG PO TB24: 10.0000 mg | ORAL_TABLET | Freq: Every day | ORAL | 1 refills | Status: AC

## 2024-05-17 NOTE — Progress Notes (Signed)
 05/17/2024 3:57 PM   Joshua [name] 06-Oct-1990 [id number]  Referring provider: Unk Corinn Skiff, MD [address] [place],  [place] [address]  Chief Complaint  Patient presents with   Testicle Pain    HPI: Joshua [name] is a 33 y.o. male referred for evaluation of scrotal pain.  Long history of bilateral scrotal pain which he states typically occurs at the initiation of voiding.  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 and excision Seen in the ED 05/08/2024 with altered mental status and states he was catheterized which was associated with significant discomfort however his urinalysis was unremarkable   PMH: Past Medical History:  Diagnosis Date   Acute renal failure (ARF)    History of    Adjustment disorder with mixed anxiety and depressed mood    Aspiration pneumonia (HCC)    Hx of   Asthma    Bronchitis    Cardiac arrest (HCC)    hx of   Cardiogenic shock (HCC)    hx of   Drug overdose    Generalized anxiety disorder    Hernia cerebri (HCC)    History of acute respiratory failure    Hypertension    Neuropathy    Polysubstance abuse (HCC)    Rhabdomyolysis    Seizures (HCC)     Surgical History: Past Surgical History:  Procedure Laterality Date   AMPUTATION Right 11/03/2017   Procedure: KNEE DISARTICULATION;  Surgeon: Jama Cordella MATSU, MD;  Location: [facility];  Service: Vascular;  Laterality: Right;   AMPUTATION Right 11/10/2017   Procedure: AMPUTATION ABOVE KNEE;  Surgeon: Jama Cordella MATSU, MD;  Location: [facility];  Service: Vascular;  Laterality: Right;   ankle/foot surgery with metal plates Bilateral    CERVICAL SPINE SURGERY     COLONOSCOPY N/A 04/13/2024   Procedure: COLONOSCOPY;  Surgeon: Unk Corinn Skiff, MD;  Location: [facility];  Service: Endoscopy;  Laterality: N/A;   CONDYLOMA EXCISION/FULGURATION  N/A 03/19/2021   Procedure: CONDYLOMA REMOVAL WITH Co2 LASER;  Surgeon: Twylla Glendia BROCKS, MD;  Location: [facility];  Service: Urology;  Laterality: N/A;   DIALYSIS/PERMA CATHETER INSERTION N/A 11/16/2017   Procedure: DIALYSIS/PERMA CATHETER INSERTION;  Surgeon: Marea Selinda RAMAN, MD;  Location: [facility];  Service: Cardiovascular;  Laterality: N/A;   DIALYSIS/PERMA CATHETER REMOVAL N/A 12/14/2017   Procedure: DIALYSIS/PERMA CATHETER REMOVAL;  Surgeon: Marea Selinda RAMAN, MD;  Location: [facility];  Service: Cardiovascular;  Laterality: N/A;   ESOPHAGOGASTRODUODENOSCOPY N/A 04/13/2024   Procedure: EGD (ESOPHAGOGASTRODUODENOSCOPY);  Surgeon: Unk Corinn Skiff, MD;  Location: [facility] ENDOSCOPY;  Service: Endoscopy;  Laterality: N/A;   permacath to right side     Scheduled to be removed    Home Medications:  Allergies as of 05/17/2024       Reactions   Zoloft [sertraline] Hives, Rash        Medication List        Accurate as of May 17, 2024  3:57 PM. If you have any questions, ask your nurse or doctor.          albuterol  108 (90 Base) MCG/ACT inhaler Commonly known as: VENTOLIN  HFA Inhale 2 puffs into the lungs every 6 (six) hours as needed for wheezing or shortness of breath.   alfuzosin  10 MG 24 hr tablet Commonly known as: UROXATRAL  Take 1 tablet (10 mg total) by mouth  daily with breakfast. Started by: Glendia JAYSON Barba   amLODipine  5 MG tablet Commonly known as: NORVASC  Take 1 tablet (5 mg total) by mouth daily.   azithromycin  250 MG tablet Commonly known as: ZITHROMAX  2 tab po x 1 day then 1 tab po daily   Buprenorphine HCl-Naloxone  HCl 8-2 MG Film Place 1 each under the tongue 2 (two) times daily.   busPIRone  5 MG tablet Commonly known as: BUSPAR  TAKE 1 TABLET BY MOUTH TWICE A DAY   divalproex  250 MG DR tablet Commonly known as: DEPAKOTE  Take 250 mg once daily for one week and then increase to 250 mg twice daily for one week and then increase to  250 mg in the morning and 500 mg   fluticasone -salmeterol 100-50 MCG/ACT Aepb Commonly known as: ADVAIR  Inhale 1 puff into the lungs 2 (two) times daily.   gabapentin  600 MG tablet Commonly known as: NEURONTIN  Take 600 mg by mouth 2 (two) times daily.   levocetirizine 5 MG tablet Commonly known as: XYZAL  Take 1 tablet (5 mg total) by mouth every evening.   lisinopril  5 MG tablet Commonly known as: ZESTRIL  Take 1 tablet (5 mg total) by mouth daily.   Misc. Devices Misc Right leg Prosthetic. Dx: Right AKA   nortriptyline 50 MG capsule Commonly known as: PAMELOR Take 1 capsule by mouth at bedtime.   omeprazole  40 MG capsule Commonly known as: PRILOSEC Take 1 capsule (40 mg total) by mouth daily before breakfast.   triamcinolone  55 MCG/ACT Aero nasal inhaler Commonly known as: NASACORT  Place 2 sprays into the nose daily.        Allergies:  Allergies  Allergen Reactions   Zoloft [Sertraline] Hives and Rash    Family History: Family History  Problem Relation Age of Onset   Depression Mother    Bipolar disorder Mother    Early death Father        Motorcycle wreck   Cancer Maternal Grandfather        leukemia   Varicose Veins Neg Hx     Social History:  reports that he has been smoking cigarettes. He started smoking about 20 years ago. He has a 8 pack-year smoking history. He has been exposed to tobacco smoke. He has never used smokeless tobacco. He reports that he does not currently use drugs after having used the following drugs: Cocaine and Methamphetamines. He reports that he does not drink alcohol.   Physical Exam: BP 125/77   Pulse 85   Ht 5' 10 (1.778 m)   Wt 196 lb 3.2 oz (89 kg)   BMI 28.15 kg/m   Constitutional:  Alert, No acute distress. HEENT: [place] AT Respiratory: Normal respiratory effort, no increased work of breathing. GU: Phallus circumcised without lesions.  Meatus normal in appearance.  No meatal condyloma identified.  Small condylomatous  left upper and mid scrotum largest <5 mm.  Testes descended bilaterally without masses or tenderness   Assessment & Plan:    1.  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

## 2024-05-19 ENCOUNTER — Telehealth: Payer: Self-pay | Admitting: *Deleted

## 2024-05-19 NOTE — Telephone Encounter (Signed)
 Copied from CRM [id number]. Topic: Clinical - Red Word Triage >> May 18, 2024  4:21 PM Chiquita SQUIBB wrote: Red Word that prompted transfer to Nurse Triage: Patient is calling in stating that he is having some memory loss and confusion. Patient also states his prostate is causing him a lot of pain as well. Patient seemed confused on the phone as well. Patient is refusing nurse triage, patient stated they will just tell him to go back to the hospital and they never help him. Patient would like to be scheduled for a visit.

## 2024-05-19 NOTE — Telephone Encounter (Signed)
 2 separate CRMs were sent on pt, see below, will route to management since pt has been dismissed

## 2024-05-19 NOTE — Telephone Encounter (Signed)
 On 01/07/2024 he was seen by Dr. Watt and new script was sent over. Is he needing something additional?

## 2024-05-19 NOTE — Telephone Encounter (Signed)
 Copied from CRM #[id number]. Topic: General - Other >> May 18, 2024  3:18 PM Joshua [name] wrote: Reason for CRM: Patient states he needs a prescription sent over to [facility] in [place] to have a new fitting for his limb, his right leg.  Joshua [name] [phone]

## 2024-05-20 ENCOUNTER — Encounter: Payer: Self-pay | Admitting: Emergency Medicine

## 2024-05-20 ENCOUNTER — Emergency Department
Admission: EM | Admit: 2024-05-20 | Discharge: 2024-05-20 | Disposition: A | Discharge status: Home / Self Care | Attending: Emergency Medicine | Admitting: Emergency Medicine

## 2024-05-20 ENCOUNTER — Other Ambulatory Visit: Payer: Self-pay

## 2024-05-20 DIAGNOSIS — E860 Dehydration: Secondary | ICD-10-CM | POA: Insufficient documentation

## 2024-05-20 DIAGNOSIS — I10 Essential (primary) hypertension: Secondary | ICD-10-CM | POA: Diagnosis not present

## 2024-05-20 DIAGNOSIS — L259 Unspecified contact dermatitis, unspecified cause: Secondary | ICD-10-CM | POA: Insufficient documentation

## 2024-05-20 DIAGNOSIS — M6282 Rhabdomyolysis: Secondary | ICD-10-CM | POA: Diagnosis not present

## 2024-05-20 DIAGNOSIS — R21 Rash and other nonspecific skin eruption: Secondary | ICD-10-CM | POA: Diagnosis present

## 2024-05-20 LAB — COMPREHENSIVE METABOLIC PANEL WITH GFR
ALT: 18 U/L (ref 0–44)
AST: 30 U/L (ref 15–41)
Albumin: 4.5 g/dL (ref 3.5–5.0)
Alkaline Phosphatase: 43 U/L (ref 38–126)
Anion gap: 13 (ref 5–15)
BUN: 10 mg/dL (ref 6–20)
CO2: 25 mmol/L (ref 22–32)
Calcium: 9.2 mg/dL (ref 8.9–10.3)
Chloride: 102 mmol/L (ref 98–111)
Creatinine, Ser: 1.15 mg/dL (ref 0.61–1.24)
GFR, Estimated: >60 mL/min (ref >60)
Glucose, Bld: 105 mg/dL — ABNORMAL HIGH (ref 70–99)
Potassium: 4.2 mmol/L (ref 3.5–5.1)
Sodium: 140 mmol/L (ref 135–145)
Total Bilirubin: 0.8 mg/dL (ref 0.0–1.2)
Total Protein: 7.5 g/dL (ref 6.5–8.1)

## 2024-05-20 LAB — CBC WITH DIFFERENTIAL/PLATELET
Abs Immature Granulocytes: 0.03 K/uL (ref 0.00–0.07)
Basophils Absolute: 0.1 K/uL (ref 0.0–0.1)
Basophils Relative: 1 %
Eosinophils Absolute: 0.5 K/uL (ref 0.0–0.5)
Eosinophils Relative: 4 %
HCT: 43.5 % (ref 39.0–52.0)
Hemoglobin: 14.9 g/dL (ref 13.0–17.0)
Immature Granulocytes: 0 %
Lymphocytes Relative: 26 %
Lymphs Abs: 2.9 K/uL (ref 0.7–4.0)
MCH: 30 pg (ref 26.0–34.0)
MCHC: 34.3 g/dL (ref 30.0–36.0)
MCV: 87.7 fL (ref 80.0–100.0)
Monocytes Absolute: 1.1 K/uL — ABNORMAL HIGH (ref 0.1–1.0)
Monocytes Relative: 10 %
Neutro Abs: 6.6 K/uL (ref 1.7–7.7)
Neutrophils Relative %: 59 %
Platelets: 346 K/uL (ref 150–400)
RBC: 4.96 MIL/uL (ref 4.22–5.81)
RDW: 12.1 % (ref 11.5–15.5)
WBC: 11.1 K/uL — ABNORMAL HIGH (ref 4.0–10.5)
nRBC: 0 % (ref 0.0–0.2)

## 2024-05-20 LAB — URINALYSIS, ROUTINE W REFLEX MICROSCOPIC
Bilirubin Urine: NEGATIVE
Glucose, UA: NEGATIVE mg/dL
Hgb urine dipstick: NEGATIVE
Ketones, ur: NEGATIVE mg/dL
Leukocytes,Ua: NEGATIVE
Nitrite: NEGATIVE
Protein, ur: NEGATIVE mg/dL
Specific Gravity, Urine: 1.028 (ref 1.005–1.030)
pH: 5 (ref 5.0–8.0)

## 2024-05-20 LAB — CK: Total CK: 514 U/L — ABNORMAL HIGH (ref 49–397)

## 2024-05-20 MED ORDER — SODIUM CHLORIDE 0.9 % IV BOLUS
1000.0000 mL | Freq: Once | INTRAVENOUS | Status: AC
  Administered 2024-05-20: 1000 mL via INTRAVENOUS

## 2024-05-20 NOTE — ED Triage Notes (Addendum)
 Pt brought in by police for medical clearance after being taken to jail and jail nurse did not feel comfortable due pt talking about his kidney issues. Pt states he was taking omeprazole  and thinks it was shutting his kidneys down, dark urine and back pain. Pt states he noted discoloration in his left lower leg and rash on right upper leg yesterday or tonight.  Pt reports lower back pain and intermittent prostate pain. Normal color of LLE w/ + pedal pules; redness noted to upper right extremity where sleeve for prosthetic limb is worn.

## 2024-05-20 NOTE — ED Notes (Signed)
 Pt discharged to [facility].  Instructions reviewed with pt and with officer.  Both verbalized understanding, no questions at this time.

## 2024-05-20 NOTE — ED Provider Notes (Signed)
 [facility] Provider Note    Event Date/Time   First MD Initiated Contact with Patient 05/20/24 [phone]     (approximate)   History   Medical Clearance   HPI  Joshua [name] is a 33 y.o. male with a history of polysubstance abuse, hypertension, seizure-like activity, and a right AKA who presents with multiple complaints.  The patient is currently in police custody and was sent in by the RN at the detention facility.  The patient states he has generalized weakness.  He reports some intermittent back pain, decreased urination, and dark urine.  He feels that his kidneys may be shutting down due to being started on omeprazole .  He also has a rash to his right thigh which is itchy.  It is where the liner for his prosthetic limb attaches.  He denies any recent change in that liner or the limb itself.  He has a few scattered areas very mild redness to his right arm.  There is no rash on his palms.  He also has some mild swelling to his left ankle which is subacute.  He denies any chest pain or difficulty breathing.  He has no vomiting or diarrhea.  He is not having any urinary retention.  I reviewed the past medical records.  The patient was seen in the ED on 9/14 with multiple nonspecific complaints.  UDS was positive for multiple substances including TCAs, amphetamines, THC, and benzodiazepines.  He was also seen in urology on 9/23 for follow-up of scrotal pain.   Physical Exam   Triage Vital Signs: ED Triage Vitals [05/20/24 0023]  Encounter Vitals Group     BP 134/89     Girls Systolic BP Percentile      Girls Diastolic BP Percentile      Boys Systolic BP Percentile      Boys Diastolic BP Percentile      Pulse Rate 94     Resp 16     Temp 98.2 F (36.8 C)     Temp Source Oral     SpO2 96 %     Weight      Height      Head Circumference      Peak Flow      Pain Score 7     Pain Loc      Pain Education      Exclude from Growth Chart     Most recent vital  signs: Vitals:   05/20/24 0023 05/20/24 0509  BP: 134/89 118/74  Pulse: 94 81  Resp: 16   Temp: 98.2 F (36.8 C) (!) 97.4 F (36.3 C)  SpO2: 96% 96%     General: Alert, slightly anxious appearing, no distress.  CV:  Good peripheral perfusion.  Resp:  Normal effort.  Abd:  No distention.  Other:  Faint erythematous papular rash to the right thigh with a line of demarcation corresponding to the top edge of his prosthesis lining.  The rash is blanching and nontender.  Trace left lower extremity edema.     ED Results / Procedures / Treatments   Labs (all labs ordered are listed, but only abnormal results are displayed) Labs Reviewed  URINALYSIS, ROUTINE W REFLEX MICROSCOPIC - Abnormal; Notable for the following components:      Result Value   Color, Urine YELLOW (*)    APPearance CLEAR (*)    All other components within normal limits  CBC WITH DIFFERENTIAL/PLATELET - Abnormal; Notable for the following  components:   WBC 11.1 (*)    Monocytes Absolute 1.1 (*)    All other components within normal limits  COMPREHENSIVE METABOLIC PANEL WITH GFR - Abnormal; Notable for the following components:   Glucose, Bld 105 (*)    All other components within normal limits  CK - Abnormal; Notable for the following components:   Total CK 514 (*)    All other components within normal limits     EKG    RADIOLOGY    PROCEDURES:  Critical Care performed: No  Procedures   MEDICATIONS ORDERED IN ED: Medications  sodium chloride  0.9 % bolus 1,000 mL (1,000 mLs Intravenous New Bag/Given 05/20/24 0324)     IMPRESSION / MDM / ASSESSMENT AND PLAN / ED COURSE  I reviewed the triage vital signs and the nursing notes.  33 year old male with PMH as noted above is in police custody and presents with multiple complaints, including generalized weakness, dark urine, and a rash on his right leg.  On exam the patient is overall well-appearing.  Vital signs are normal.  Physical exam is as  described above.  Differential diagnosis includes, but is not limited to, dehydration, AKI, rhabdomyolysis, UTI, electrolyte abnormality, other metabolic cause, polysubstance abuse, anxiety.  The rash is most consistent with a contact dermatitis.  We will obtain basic labs, CK, urinalysis, and reassess.  Patient's presentation is most consistent with acute complicated illness / injury requiring diagnostic workup.  ----------------------------------------- 5:17 AM on 05/20/2024 -----------------------------------------  CMP and CBC show no acute findings.  CK is elevated consistent with mild rhabdomyolysis.  The patient has received a fluid bolus.  He is feeling better.  At this time, there is no indication for inpatient admission.  The patient is stable for discharge and is cleared for discharge to jail.  I counseled him on the results of the workup and plan of care.  I recommend using over-the-counter hydrocortisone  cream for the likely contact dermatitis.  I gave strict return precautions, and the patient expressed understanding.  FINAL CLINICAL IMPRESSION(S) / ED DIAGNOSES   Final diagnoses:  Non-traumatic rhabdomyolysis  Dehydration  Contact dermatitis, unspecified contact dermatitis type, unspecified trigger     Rx / DC Orders   ED Discharge Orders     None        Note:  This document was prepared using Dragon voice recognition software and may include unintentional dictation errors.    Jacolyn Pae, MD 05/20/24 [phone]

## 2024-05-20 NOTE — Discharge Instructions (Signed)
 Make sure to drink plenty of fluids.  You may use over-the-counter hydrocortisone  1% cream or ointment on the rash on your leg.  Return to the ER for new, worsening, or persistent weakness, rash, fever, vomiting, difficulty urinating, or any other new or worsening symptoms that concern you.

## 2024-05-22 LAB — LAB REPORT - SCANNED

## 2024-05-23 NOTE — Telephone Encounter (Signed)
Faxed script to [facility]

## 2024-05-23 NOTE — Telephone Encounter (Signed)
 Script placed on Joshua [name]'s desk to be faxed

## 2024-06-08 ENCOUNTER — Ambulatory Visit: Admitting: Nurse Practitioner

## 2024-06-15 ENCOUNTER — Other Ambulatory Visit: Payer: Self-pay | Admitting: Urology

## 2024-06-17 ENCOUNTER — Ambulatory Visit: Admitting: Urology

## 2024-06-17 ENCOUNTER — Encounter: Payer: Self-pay | Admitting: Urology

## 2024-06-17 VITALS — BP 125/87 | HR 111 | Ht 70.0 in | Wt 186.0 lb

## 2024-06-17 DIAGNOSIS — G894 Chronic pain syndrome: Secondary | ICD-10-CM

## 2024-06-17 DIAGNOSIS — N5082 Scrotal pain: Secondary | ICD-10-CM

## 2024-06-17 DIAGNOSIS — N411 Chronic prostatitis: Secondary | ICD-10-CM | POA: Diagnosis not present

## 2024-06-17 DIAGNOSIS — A630 Anogenital (venereal) warts: Secondary | ICD-10-CM | POA: Diagnosis not present

## 2024-06-17 LAB — URINALYSIS, COMPLETE
Bilirubin, UA: NEGATIVE
Glucose, UA: NEGATIVE
Ketones, UA: NEGATIVE
Leukocytes,UA: NEGATIVE
Nitrite, UA: NEGATIVE
Protein,UA: NEGATIVE
RBC, UA: NEGATIVE
Specific Gravity, UA: 1.03 (ref 1.005–1.030)
Urobilinogen, Ur: 0.2 mg/dL (ref 0.2–1.0)
pH, UA: 6 (ref 5.0–7.5)

## 2024-06-17 LAB — MICROSCOPIC EXAMINATION

## 2024-06-18 ENCOUNTER — Encounter: Payer: Self-pay | Admitting: Urology

## 2024-06-18 NOTE — Progress Notes (Signed)
 06/17/2024 7:24 PM   Joshua [name] Jul 19, 1991 [id number]  Referring provider: Wendee Lynwood HERO, NP [address] [place],  [place] [address]  Chief Complaint  Patient presents with   Establish Care   Dizziness   Seizures    HPI: Joshua [name] is a 33 y.o. male presents for follow-up visit.  Refer to my prior office note 05/17/2024.  He was started on alfuzosin  and presents for follow-up for recheck and for treatment of penile condylomata. He had multiple somatic complaints today including dizziness, nausea, vomiting, balance issues and seizures.  Recent ED visit for evaluation of seizures. He complains of a painful lump on his prostate. Variable lower urinary tract symptoms   PMH: Past Medical History:  Diagnosis Date   Acute renal failure (ARF)    History of    Adjustment disorder with mixed anxiety and depressed mood    Aspiration pneumonia (HCC)    Hx of   Asthma    Bronchitis    Cardiac arrest (HCC)    hx of   Cardiogenic shock (HCC)    hx of   Drug overdose    Generalized anxiety disorder    Hernia cerebri (HCC)    History of acute respiratory failure    Hypertension    Neuropathy    Polysubstance abuse (HCC)    Rhabdomyolysis    Seizures (HCC)     Surgical History: Past Surgical History:  Procedure Laterality Date   AMPUTATION Right 11/03/2017   Procedure: KNEE DISARTICULATION;  Surgeon: Jama Cordella MATSU, MD;  Location: [facility];  Service: Vascular;  Laterality: Right;   AMPUTATION Right 11/10/2017   Procedure: AMPUTATION ABOVE KNEE;  Surgeon: Jama Cordella MATSU, MD;  Location: [facility];  Service: Vascular;  Laterality: Right;   ankle/foot surgery with metal plates Bilateral    CERVICAL SPINE SURGERY     COLONOSCOPY N/A 04/13/2024   Procedure: COLONOSCOPY;  Surgeon: Unk Corinn Skiff, MD;  Location: [facility] ENDOSCOPY;  Service: Endoscopy;  Laterality: N/A;   CONDYLOMA EXCISION/FULGURATION N/A 03/19/2021   Procedure: CONDYLOMA REMOVAL WITH Co2 LASER;   Surgeon: Twylla Joshua BROCKS, MD;  Location: [facility];  Service: Urology;  Laterality: N/A;   DIALYSIS/PERMA CATHETER INSERTION N/A 11/16/2017   Procedure: DIALYSIS/PERMA CATHETER INSERTION;  Surgeon: Marea Selinda RAMAN, MD;  Location: [facility];  Service: Cardiovascular;  Laterality: N/A;   DIALYSIS/PERMA CATHETER REMOVAL N/A 12/14/2017   Procedure: DIALYSIS/PERMA CATHETER REMOVAL;  Surgeon: Marea Selinda RAMAN, MD;  Location: [facility];  Service: Cardiovascular;  Laterality: N/A;   ESOPHAGOGASTRODUODENOSCOPY N/A 04/13/2024   Procedure: EGD (ESOPHAGOGASTRODUODENOSCOPY);  Surgeon: Unk Corinn Skiff, MD;  Location: [facility] ENDOSCOPY;  Service: Endoscopy;  Laterality: N/A;   permacath to right side     Scheduled to be removed    Home Medications:  Allergies as of 06/17/2024       Reactions   Zoloft [sertraline] Hives, Rash        Medication List        Accurate as of June 17, 2024 11:59 PM. If you have any questions, ask your nurse or doctor.          albuterol  108 (90 Base) MCG/ACT inhaler Commonly known as: VENTOLIN  HFA Inhale 2 puffs into the lungs every 6 (six) hours as needed for wheezing or shortness of breath.   alfuzosin  10 MG 24 hr tablet Commonly known as: UROXATRAL  Take 1 tablet (10 mg total) by mouth daily with breakfast.   amLODipine   5 MG tablet Commonly known as: NORVASC  Take 1 tablet (5 mg total) by mouth daily.   azithromycin  250 MG tablet Commonly known as: ZITHROMAX  2 tab po x 1 day then 1 tab po daily   Buprenorphine HCl-Naloxone  HCl 8-2 MG Film Place 1 each under the tongue 2 (two) times daily.   busPIRone  5 MG tablet Commonly known as: BUSPAR  TAKE 1 TABLET BY MOUTH TWICE A DAY   divalproex  250 MG DR tablet Commonly known as: DEPAKOTE  Take 250 mg once daily for one week and then increase to 250 mg twice daily for one week and then increase to 250 mg in the morning and 500 mg   fluticasone -salmeterol 100-50 MCG/ACT Aepb Commonly known  as: ADVAIR  Inhale 1 puff into the lungs 2 (two) times daily.   gabapentin  600 MG tablet Commonly known as: NEURONTIN  Take 600 mg by mouth 2 (two) times daily.   levocetirizine 5 MG tablet Commonly known as: XYZAL  Take 1 tablet (5 mg total) by mouth every evening.   lisinopril  5 MG tablet Commonly known as: ZESTRIL  Take 1 tablet (5 mg total) by mouth daily.   Misc. Devices Misc Right leg Prosthetic. Dx: Right AKA   nortriptyline 50 MG capsule Commonly known as: PAMELOR Take 1 capsule by mouth at bedtime.   omeprazole  40 MG capsule Commonly known as: PRILOSEC Take 1 capsule (40 mg total) by mouth daily before breakfast.   triamcinolone  55 MCG/ACT Aero nasal inhaler Commonly known as: NASACORT  Place 2 sprays into the nose daily.        Allergies:  Allergies  Allergen Reactions   Zoloft [Sertraline] Hives and Rash    Family History: Family History  Problem Relation Age of Onset   Depression Mother    Bipolar disorder Mother    Early death Father        Motorcycle wreck   Cancer Maternal Grandfather        leukemia   Varicose Veins Neg Hx     Social History:  reports that he has been smoking cigarettes. He started smoking about 20 years ago. He has a 8 [name]-year smoking history. He has been exposed to tobacco smoke. He has never used smokeless tobacco. He reports that he does not currently use drugs after having used the following drugs: Marijuana. He reports that he does not drink alcohol.   Physical Exam: BP 125/87 (BP Location: Left Arm, Patient Position: Sitting, Cuff Size: Large)   Pulse (!) 111   Ht 5' 10 (1.778 m)   Wt 186 lb (84.4 kg)   SpO2 99%   BMI 26.69 kg/m   Constitutional:  Alert, No acute distress. HEENT: [place] AT Respiratory: Normal respiratory effort, no increased work of breathing. GU: ~5 mm mildly tender perianal mass; probable hemorrhoid.  The previously noted condylomata were treated with Histofreeze   Assessment & Plan:    1.   Chronic prostatitis/chronic pelvic pain syndrome Continue alfuzosin  PCP follow-up for his nonurologic complaints  2.  Genital condyloma He will call for follow-up retreatment with PA and ~1 month if any visible lesions remain   Joshua JAYSON Barba, MD  [facility] [address] [place], [place] [address] [phone]

## 2024-06-21 NOTE — Progress Notes (Signed)
 Chief Complaint  Patient presents with  . Dizziness  . Seizures  . Hemorrhoids  . weight loss    Has lost about 25 lbs in 3 weeks  . Fatigue    HPI  Joshua [name] is a 33 year old male with history of seizure disorder, anxiety, polysubstance abuse returning to the clinic after last being seen in December 2024.  Since then he has been following with couple other providers in the area.  Patient open admits that has been kicked out of other clinics.  His main issue is that has been having some intermittent dizziness with the room spinning with certain head movements.  Blood pressure stable today.  Dizziness only lasts for a few moments then subsides.  Will associate some nausea during the episodes.  Occasionally will feel some chest tightness and difficulty breathing and feels similar to his prior anxiety and panic.  He does request Xanax  refill.  Has had some recurrent seizures over the past few months follows with Dr. Lane here in neurology.  States he had a fall and hit his head and had some neck pain since which was 4 weeks ago.  When looking in Care Everywhere in the emergency room on 05/08/2024 CT head was stable.  Does have history of spinal surgery of the cervical spine.  No new weakness or numbness.  He states his seizures have been a little bit worse lately and wonders if he needs to pursue MRI.  He also concerned he may have a UTI.  Having some decreased urine output.  Struggles with lower back pain at baseline.  Denies any blood in the urine.  ROS  Review of systems is unremarkable for any active cardiac, respiratory, GI, GU, hematologic, neurologic, dermatologic, HEENT, or psychiatric symptoms except as noted above.  No fevers, chills, or constitutional symptoms.   Current Outpatient Medications  Medication Sig Dispense Refill  . albuterol  90 mcg/actuation inhaler Inhale 2 inhalations into the lungs every 4 (four) hours as needed for Wheezing 1 each 1  . ALPRAZolam  (XANAX ) 0.5 MG tablet  Take 0.5 mg by mouth 3 (three) times daily as needed    . amLODIPine  (NORVASC ) 5 MG tablet Take 5 mg by mouth once daily    . ARIPiprazole (ABILIFY) 5 MG tablet Take 1 tablet (5 mg total) by mouth once daily for 60 days 30 tablet 1  . divalproex  (DEPAKOTE ) 250 MG DR tablet TAKE 250 MG ONCE DAILY FOR ONE WEEK AND THEN INCREASE TO 250 MG TWICE DAILY FOR ONE WEEK AND THEN INCREASE TO 250 MG IN THE MORNING AND 500 MG AT NIGHT (Patient taking differently: Take 250 mg by mouth once daily) 90 tablet 1  . divalproex  (DEPAKOTE ) 250 MG DR tablet Take 250 mg daily for 1 week, then increase 250 mg twice a day 60 tablet 1  . famotidine  (PEPCID ) 20 MG tablet Take 20 mg by mouth once daily    . fluticasone  propion-salmeteroL (ADVAIR  HFA) 45-21 mcg/actuation inhaler Inhale 2 inhalations into the lungs every 12 (twelve) hours    . gabapentin  (NEURONTIN ) 600 MG tablet Take 1 tablet (600 mg total) by mouth 3 (three) times daily 270 tablet 3  . gabapentin  (NEURONTIN ) 600 MG tablet Take 600 mg by mouth 2 (two) times daily    . levocetirizine (XYZAL ) 5 MG tablet Take 5 mg by mouth every evening    . lisinopriL  (ZESTRIL ) 5 MG tablet Take 1 tablet (5 mg total) by mouth once daily for 180 days 30 tablet 5  .  nortriptyline (PAMELOR) 10 MG capsule Take 30 mg nightly for 1 week, then increase to 40 mg nightly (Patient taking differently: 40 mg Take 30 mg nightly for 1 week, then increase to 40 mg nightly) 120 capsule 3  . triamcinolone  (NASACORT  AQ) 55 mcg nasal spray Place 2 sprays into both nostrils once daily    . ARIPiprazole (ABILIFY) 10 MG tablet Take 1 tablet (10 mg total) by mouth once daily (Patient not taking: Reported on 06/21/2024) 90 tablet 3  . budesonide-formoteroL (SYMBICORT) 80-4.5 mcg/actuation inhaler Inhale 2 inhalations into the lungs 2 (two) times daily (Patient not taking: Reported on 06/21/2024)    . buprenorphine-naloxone  (SUBOXONE) 8-2 mg SL film Place 1 Film under the tongue 2 (two) times daily  (Patient not taking: Reported on 06/21/2024)    . busPIRone  (BUSPAR ) 5 MG tablet Take 5 mg by mouth 2 (two) times daily (Patient not taking: Reported on 06/21/2024)    . busPIRone  (BUSPAR ) 5 MG tablet Take 5 mg by mouth 2 (two) times daily (Patient not taking: Reported on 06/21/2024)    . dextroamphetamine-amphetamine (ADDERALL XR) 30 MG XR capsule Take 30 mg by mouth every morning Takes BID for seizures (Patient not taking: Reported on 06/21/2024)    . fluticasone  propionate (FLONASE) 50 mcg/actuation nasal spray Place 1 spray into both nostrils 2 (two) times daily (Patient not taking: Reported on 04/05/2024) 16 g 0  . lamoTRIgine  (LAMICTAL ) 100 MG tablet Take 100 mg by mouth every 12 (twelve) hours (Patient not taking: Reported on 06/21/2024)    . linaCLOtide (LINZESS) 290 mcg capsule Take 1 capsule (290 mcg total) by mouth once daily (Patient not taking: Reported on 06/21/2024) 30 capsule 1  . meclizine (ANTIVERT) 25 mg tablet Take 1 tablet (25 mg total) by mouth 3 (three) times daily as needed for Dizziness for up to 10 days 30 tablet 0  . methocarbamoL  (ROBAXIN ) 500 MG tablet Take 500 mg by mouth 4 (four) times daily (Patient not taking: Reported on 06/21/2024)    . nortriptyline (PAMELOR) 50 MG capsule TAKE 1 CAPSULE BY MOUTH EVERYDAY AT BEDTIME (Patient not taking: Reported on 06/21/2024) 90 capsule 1  . oxyCODONE -acetaminophen  (PERCOCET) 5-325 mg tablet Take 1 tablet by mouth every 6 (six) hours as needed for Pain (Patient not taking: Reported on 06/21/2024)    . sodium, potassium, and magnesium  (SUPREP) oral solution Take 1 Bottle by mouth as directed One kit contains 2 bottles.  Take both bottles at the times instructed by your provider. (Patient not taking: Reported on 06/21/2024) 354 mL 0  . tiZANidine (ZANAFLEX) 4 MG tablet Take 4 mg by mouth 3 (three) times daily (Patient not taking: Reported on 06/21/2024)     No current facility-administered medications for this visit.    Allergies as  of 06/21/2024 - Reviewed 06/21/2024  Allergen Reaction Noted  . Codeine  Other (See Comments) 08/02/2018  . Tramadol  Unknown 12/23/2023  . Sertraline Hives and Rash 02/06/2021    Patient Active Problem List  Diagnosis  . Acute kidney failure, unspecified ()  . Adjustment disorder with mixed anxiety and depressed mood  . At risk for abuse of opiates  . Chronic bilateral low back pain without sciatica  . Drug abuse (CMS-HCC)  . Impotence  . Rhabdomyolysis  . S/P AKA (above knee amputation), right (CMS/HHS-HCC)  . Loss of memory  . Neck pain  . History of depression  . Anxiety, generalized  . Seizure-like activity (CMS/HHS-HCC)    Past Medical History:  Diagnosis Date  .  Acute renal failure (ARF) () 09/02/2018  . Anxiety   . Asthma (HHS-HCC)   . Cardiac arrest (CMS/HHS-HCC) 10/30/2017  . Cardiogenic shock (CMS/HHS-HCC) 10/30/2017  . Cocaine use disorder (CMS-HCC)   . Depression   . Drug overdose   . Hernia cerebri (CMS/HHS-HCC)   . History of acute respiratory failure   . History of aspiration pneumonia   . History of asthma   . History of substance abuse (CMS-HCC)   . Hypertension   . Neuropathy   . Opioid use disorder   . Polysubstance abuse (CMS-HCC)    opioids, cocaine, THC, benzodiazepines, amphetamines  . Rhabdomyolysis   . S/P AKA (above knee amputation) (CMS/HHS-HCC) 12/08/2017   right  . Seizure-like activity (CMS/HHS-HCC)   . Tobacco user     Past Surgical History:  Procedure Laterality Date  . Lower extremity surgery  2013  . AMPUTATION LEG ABOVE KNEE Right 11/03/2017   Surgeon: Jama Cordella MATSU, MD; Location: [facility] ORS; Service: Vascular; Laterality: Right;  . DISARTICULATION AT KNEE  11/03/2017   Surgeon: Jama Cordella MATSU, MD; Location: [facility] ORS; Service: Vascular; Laterality: Right;  . DIALYSIS/PERMA CATHETER INSERTION   11/16/2017   Surgeon: Marea Selinda RAMAN, MD; Location: [facility] INVASIVE CV LAB; Service: Cardiovascular; Laterality: N/A;  .  DIALYSIS/PERMA CATHETER REMOVAL  12/14/2017   Surgeon: Marea Selinda RAMAN, MD; Location: [facility] INVASIVE CV LAB; Service: Cardiovascular; Laterality: N/A;  permacath to right side  . CONDYLOMA REMOVAL WITH Co2 LASER  03/19/2021   Surgeon: Twylla Glendia BROCKS, MD; Location: [facility] ORS; Service: Urology; Laterality: N/A;  . COLON@[facility]   04/13/2024   PoorPrep/RptPRN/RRV  . EGD@[facility]   04/13/2024   NML/RptPRN/RRV  . ANTERIOR FUSION CLIVUS-C2 TRANSORAL W/O ODONTOID EXCISION     with hardware per patient    Vitals:   06/21/24 1053  BP: (!) 140/88  Pulse: 96  SpO2: 97%  Weight: 82.7 kg (182 lb 6.4 oz)  Height: 172.7 cm (5' 8)  PainSc:   8  PainLoc: Back   Body mass index is 27.73 kg/m.  Exam BP (!) 140/88 (BP Location: Left upper arm, Patient Position: Sitting, BP Cuff Size: Adult)   Pulse 96   Ht 172.7 cm (5' 8)   Wt 82.7 kg (182 lb 6.4 oz)   SpO2 97%   BMI 27.73 kg/m   General. Well appearing; NAD; VS reviewed     Eyes. Sclera and conjunctiva clear Oropharynx. No suspicious lesions Neck. Supple. No swelling, masses, thyroid  normal size, no masses palpated.  No carotid bruits. Lungs. Respirations unlabored; clear to auscultation bilaterally Cardiovascular. Heart regular rate and rhythm without murmurs, gallops, or rubs Extremities:  no edema; AKA of the right leg. Skin. Normal color and turgor Neurologic. Alert and oriented x3; CN 2-12 grossly intact; no focal deficits  Assessment & Plan   Vertigo Acute; lung exam and heart exam are reassuring.  EKG today normal sinus rhythm with no significant ST or T wave changes.; checking CBC, CMP, UA.  TSH stable at the ER visit on 12 October.  Does not appear to be any neurological deficit and would suspect if there was a head bleed from 4 weeks ago there would be significant neurological deficits.  We did discuss head imaging but given he is have progressive seizures we discussed that he needs a follow-up with neurology and determine if MRI is  necessary at this time.  Will refer him to ENT as I do believe he has some vertigo issues and will send in meclizine 25 mg  3 times a day as needed for dizziness.  Hx of seizure disorder Ongoing; has had several seizure activity.  Advised him to reach out to neurology today to get a follow-up appointment to further manage.  Anxiety, generalized Ongoing; anxiety could be stimulating some of the intermittent chest pain and shortness of breath as he struggles with panic.  Do not feel comfortable prescribing Xanax  as I do not think this is the best course of action.  Has not tolerated SSRIs in the past.  When looking in the [facility] it appears Xanax  was last prescribed May of this year from prior PCP he was following.  I will refer him to behavioral health at [facility].  Have given him information for [facility] and [facility] behavioral health urgent care if needed.  Per reviewing the chart he has probably substance abuse and history of benzodiazepine dependence and will avoid a Xanax  today.  Decreased urine output Ongoing; checking labs and urine today.  Could be hydration related.  Will have him push fluids.  Loss of memory Ongoing; continue to follow-up with neurology.  Chronic bilateral low back pain without sciatica Ongoing; no need for acute imaging today but ruling out UTI today.  Atypical chest pain Ongoing; appears to be more muscular on exam.  Mostly tender over the anterior pectoralis and costal cartilage.  Will obtain chest x-ray today and checking labs.  If stable we can treat conservatively with Tylenol  or ibuprofen .  EKG as above.  Low suspicion for PE or heart failure or ACS.  Cervicalgia Ongoing; had a fall 4 weeks ago.  No neurological symptoms but pain along the back of the neck.  Will obtain x-ray films today.  F/U: Patient follow-up to be determined based on response of meclizine and labs and imaging today.  JASON HESTLE WHITAKER, PA  This note has been  created using automated tools and reviewed for accuracy by JASON HESTLE WHITAKER.   Note: This dictation was prepared with Dragon dictation along with smaller phrase technology. Any transcriptional errors that result from this process are unintentional.

## 2024-07-03 ENCOUNTER — Other Ambulatory Visit: Payer: Self-pay

## 2024-07-03 ENCOUNTER — Emergency Department

## 2024-07-03 ENCOUNTER — Emergency Department: Admission: EM | Admit: 2024-07-03 | Discharge: 2024-07-03 | Discharge status: Against Medical Advice

## 2024-07-03 DIAGNOSIS — J45901 Unspecified asthma with (acute) exacerbation: Secondary | ICD-10-CM | POA: Diagnosis not present

## 2024-07-03 DIAGNOSIS — R300 Dysuria: Secondary | ICD-10-CM | POA: Diagnosis not present

## 2024-07-03 DIAGNOSIS — Z89611 Acquired absence of right leg above knee: Secondary | ICD-10-CM | POA: Insufficient documentation

## 2024-07-03 DIAGNOSIS — R109 Unspecified abdominal pain: Secondary | ICD-10-CM | POA: Insufficient documentation

## 2024-07-03 DIAGNOSIS — R079 Chest pain, unspecified: Secondary | ICD-10-CM | POA: Diagnosis not present

## 2024-07-03 DIAGNOSIS — Z7951 Long term (current) use of inhaled steroids: Secondary | ICD-10-CM | POA: Insufficient documentation

## 2024-07-03 DIAGNOSIS — R059 Cough, unspecified: Secondary | ICD-10-CM | POA: Diagnosis present

## 2024-07-03 DIAGNOSIS — Z8674 Personal history of sudden cardiac arrest: Secondary | ICD-10-CM | POA: Diagnosis not present

## 2024-07-03 DIAGNOSIS — R0602 Shortness of breath: Secondary | ICD-10-CM | POA: Diagnosis present

## 2024-07-03 DIAGNOSIS — R10A3 Flank pain, bilateral: Secondary | ICD-10-CM | POA: Insufficient documentation

## 2024-07-03 LAB — URINALYSIS, COMPLETE (UACMP) WITH MICROSCOPIC
Bacteria, UA: NONE SEEN
Bilirubin Urine: NEGATIVE
Glucose, UA: NEGATIVE mg/dL
Hgb urine dipstick: NEGATIVE
Ketones, ur: NEGATIVE mg/dL
Leukocytes,Ua: NEGATIVE
Nitrite: NEGATIVE
Protein, ur: NEGATIVE mg/dL
Specific Gravity, Urine: 1.014 (ref 1.005–1.030)
Squamous Epithelial / HPF: 0 /HPF (ref 0–5)
pH: 9 — ABNORMAL HIGH (ref 5.0–8.0)

## 2024-07-03 LAB — CBC WITH DIFFERENTIAL/PLATELET
Abs Immature Granulocytes: 0.02 K/uL (ref 0.00–0.07)
Basophils Absolute: 0.1 K/uL (ref 0.0–0.1)
Basophils Relative: 1 %
Eosinophils Absolute: 0.3 K/uL (ref 0.0–0.5)
Eosinophils Relative: 4 %
HCT: 45.2 % (ref 39.0–52.0)
Hemoglobin: 15 g/dL (ref 13.0–17.0)
Immature Granulocytes: 0 %
Lymphocytes Relative: 18 %
Lymphs Abs: 1.7 K/uL (ref 0.7–4.0)
MCH: 30.1 pg (ref 26.0–34.0)
MCHC: 33.2 g/dL (ref 30.0–36.0)
MCV: 90.6 fL (ref 80.0–100.0)
Monocytes Absolute: 0.5 K/uL (ref 0.1–1.0)
Monocytes Relative: 6 %
Neutro Abs: 6.7 K/uL (ref 1.7–7.7)
Neutrophils Relative %: 71 %
Platelets: 364 K/uL (ref 150–400)
RBC: 4.99 MIL/uL (ref 4.22–5.81)
RDW: 12.2 % (ref 11.5–15.5)
WBC: 9.4 K/uL (ref 4.0–10.5)
nRBC: 0 % (ref 0.0–0.2)

## 2024-07-03 LAB — COMPREHENSIVE METABOLIC PANEL WITH GFR
ALT: 25 U/L (ref 0–44)
AST: 36 U/L (ref 15–41)
Albumin: 4 g/dL (ref 3.5–5.0)
Alkaline Phosphatase: 43 U/L (ref 38–126)
Anion gap: 12 (ref 5–15)
BUN: 9 mg/dL (ref 6–20)
CO2: 26 mmol/L (ref 22–32)
Calcium: 8.7 mg/dL — ABNORMAL LOW (ref 8.9–10.3)
Chloride: 100 mmol/L (ref 98–111)
Creatinine, Ser: 0.89 mg/dL (ref 0.61–1.24)
GFR, Estimated: >60 mL/min (ref >60)
Glucose, Bld: 94 mg/dL (ref 70–99)
Potassium: 3.8 mmol/L (ref 3.5–5.1)
Sodium: 138 mmol/L (ref 135–145)
Total Bilirubin: 0.6 mg/dL (ref 0.0–1.2)
Total Protein: 7.4 g/dL (ref 6.5–8.1)

## 2024-07-03 LAB — BRAIN NATRIURETIC PEPTIDE: B Natriuretic Peptide: 2.4 pg/mL (ref 0.0–100.0)

## 2024-07-03 LAB — PROTIME-INR
INR: 1 (ref 0.8–1.2)
Prothrombin Time: 13.6 s (ref 11.4–15.2)

## 2024-07-03 LAB — RESP PANEL BY RT-PCR (RSV, FLU A&B, COVID)  RVPGX2
Influenza A by PCR: NEGATIVE
Influenza B by PCR: NEGATIVE
Resp Syncytial Virus by PCR: NEGATIVE
SARS Coronavirus 2 by RT PCR: NEGATIVE

## 2024-07-03 LAB — TROPONIN I (HIGH SENSITIVITY)
Troponin I (High Sensitivity): 3 ng/L (ref <18)
Troponin I (High Sensitivity): 4 ng/L (ref <18)

## 2024-07-03 LAB — D-DIMER, QUANTITATIVE: D-Dimer, Quant: 0.45 ug{FEU}/mL (ref 0.00–0.50)

## 2024-07-03 LAB — LIPASE, BLOOD: Lipase: 32 U/L (ref 11–51)

## 2024-07-03 MED ORDER — ALUM & MAG HYDROXIDE-SIMETH 200-200-20 MG/5ML PO SUSP
30.0000 mL | Freq: Once | ORAL | Status: AC
  Administered 2024-07-03: 30 mL via ORAL
  Filled 2024-07-03: qty 30

## 2024-07-03 MED ORDER — IPRATROPIUM-ALBUTEROL 0.5-2.5 (3) MG/3ML IN SOLN
3.0000 mL | Freq: Once | RESPIRATORY_TRACT | Status: AC
  Administered 2024-07-03: 3 mL via RESPIRATORY_TRACT
  Filled 2024-07-03: qty 3

## 2024-07-03 MED ORDER — FAMOTIDINE 20 MG PO TABS
20.0000 mg | ORAL_TABLET | Freq: Once | ORAL | Status: AC
  Administered 2024-07-03: 20 mg via ORAL
  Filled 2024-07-03: qty 1

## 2024-07-03 MED ORDER — LIDOCAINE VISCOUS HCL 2 % MT SOLN
15.0000 mL | Freq: Once | OROMUCOSAL | Status: AC
  Administered 2024-07-03: 15 mL via ORAL
  Filled 2024-07-03: qty 15

## 2024-07-03 MED ORDER — ACETAMINOPHEN 500 MG PO TABS
1000.0000 mg | ORAL_TABLET | Freq: Once | ORAL | Status: AC
  Administered 2024-07-03: 1000 mg via ORAL
  Filled 2024-07-03: qty 2

## 2024-07-03 MED ORDER — ALBUTEROL SULFATE HFA 108 (90 BASE) MCG/ACT IN AERS: 2.0000 | INHALATION_SPRAY | Freq: Four times a day (QID) | RESPIRATORY_TRACT | 2 refills | Status: AC | PRN

## 2024-07-03 NOTE — ED Provider Notes (Signed)
 Trop #2 is normal (patient left AMA)   Dicky Anes, MD 07/04/24 [phone]

## 2024-07-03 NOTE — ED Provider Notes (Signed)
 [facility] Provider Note    Event Date/Time   First MD Initiated Contact with Patient 07/03/24 1412     (approximate)   History   Shortness of Breath  Pt arrives via [facility] from home for SOB. Per fire, SpO2 was 86%, improved to 100% on 3 lpm via [place]. EMS reports diminished lung sounds. Pt arrives alert, no distress at this time. Pt c/o pleuritic pain with inspiration. Pt reports the dyspnea has been worsening over a few days. Pt is not conversationally dyspneic.   HPI Joshua [name] is a 33 y.o. male PMH asthma, seizures, substance use, right AKA, prior cardiac arrest and cardiogenic shock, frequent ED utilization presents for evaluation of multiple complaints - Appears patient primarily called an ambulance because he was feeling short of breath over the past few days.  No fevers, unclear if he is having cough. - Also notes chest pain and bodyaches - Per EMS was reportedly hypoxic on scene with diminished lung sounds throughout       Physical Exam   Triage Vital Signs: ED Triage Vitals  Encounter Vitals Group     BP 07/03/24 1402 (!) 136/92     Girls Systolic BP Percentile --      Girls Diastolic BP Percentile --      Boys Systolic BP Percentile --      Boys Diastolic BP Percentile --      Pulse Rate 07/03/24 1403 94     Resp 07/03/24 1403 19     Temp --      Temp src --      SpO2 07/03/24 1403 99 %     Weight 07/03/24 1401 185 lb (83.9 kg)     Height 07/03/24 1401 5' 10 (1.778 m)     Head Circumference --      Peak Flow --      Pain Score 07/03/24 1401 8     Pain Loc --      Pain Education --      Exclude from Growth Chart --     Most recent vital signs: Vitals:   07/03/24 1403 07/03/24 1422  BP:    Pulse: 94   Resp: 19   Temp:  98.3 F (36.8 C)  SpO2: 99%    Satting 100% on room air at time of my eval.  General: Awake, no distress.  CV:  Good peripheral perfusion. RRR, RP 2+ Resp:  Normal effort.  Diminished breath sounds  bilaterally. Abd:  No distention. Nontender to deep palpation throughout Other:  S/p R AKA   ED Results / Procedures / Treatments   Labs (all labs ordered are listed, but only abnormal results are displayed) Labs Reviewed  COMPREHENSIVE METABOLIC PANEL WITH GFR - Abnormal; Notable for the following components:      Result Value   Calcium  8.7 (*)    All other components within normal limits  RESP PANEL BY RT-PCR (RSV, FLU A&B, COVID)  RVPGX2  CBC WITH DIFFERENTIAL/PLATELET  LIPASE, BLOOD  PROTIME-INR  BRAIN NATRIURETIC PEPTIDE  D-DIMER, QUANTITATIVE  URINALYSIS, COMPLETE (UACMP) WITH MICROSCOPIC  TROPONIN I (HIGH SENSITIVITY)     EKG Ecg = sinus rhythm, rate 81, no gross ST elevation or depression, no significant repolarization abnormality, normal axis, normal intervals.  No clear evidence of ischemia nor arrhythmia on my interpretation.    RADIOLOGY Chest x-ray interpreted by myself and radiology report reviewed.  No acute pathology identified.    PROCEDURES:  Critical Care  performed: No  Procedures   MEDICATIONS ORDERED IN ED: Medications  alum & mag hydroxide-simeth (MAALOX/MYLANTA) 200-200-20 MG/5ML suspension 30 mL (has no administration in time range)    And  lidocaine  (XYLOCAINE ) 2 % viscous mouth solution 15 mL (has no administration in time range)  famotidine  (PEPCID ) tablet 20 mg (has no administration in time range)  acetaminophen  (TYLENOL ) tablet 1,000 mg (has no administration in time range)  ipratropium-albuterol  (DUONEB) 0.5-2.5 (3) MG/3ML nebulizer solution 3 mL (3 mLs Nebulization Given 07/03/24 1437)  ipratropium-albuterol  (DUONEB) 0.5-2.5 (3) MG/3ML nebulizer solution 3 mL (3 mLs Nebulization Given 07/03/24 1437)     IMPRESSION / MDM / ASSESSMENT AND PLAN / ED COURSE  I reviewed the triage vital signs and the nursing notes.                              DDX/MDM/AP: Differential diagnosis includes, but is not limited to, likely asthma  exacerbation, consider underlying viral syndrome, pneumonia, CHF, ACS, PE.  Consider substance use contributing.  Plan: - DuoNebs -chest x-ray - EKG - Reassess  Patient's presentation is most consistent with acute presentation with potential threat to life or bodily function.  The patient is on the cardiac monitor to evaluate for evidence of arrhythmia and/or significant heart rate changes.  ED course below.  Initial workup very reassuring open-troponin normal, dimer normal, BNP normal, hepatobiliary labs normal.  Chest x-ray with no acute pathology.  EKG nonischemic.  Remains satting well on room air.  Repeat lung exam notably improved with good aeration throughout, no wheezing.  Suspect asthma exacerbation likely etiology of presentation.  This is endorsing ongoing chest discomfort, GI cocktail ordered for possible GERD/dyspepsia, will plan to trend troponin.  Patient also now complaining of bilateral flank discomfort and notes history of UTIs, says he has foul-smelling urine-urinalysis added to ensure no underlying UTI.  Abdominal exam benign, no clinical concern for intra-abdominal pathology at this time.  Signed out to oncoming ED provider pending repeat troponin, urinalysis, results of viral swab.  If unremarkable, stable for discharge home from my perspective.  Rx albuterol .   Clinical Course as of 07/03/24 1524  Sun Jul 03, 2024  1439 Cbc wnl [MM]  1513 CXR: IMPRESSION: 1. No acute cardiopulmonary process.   [MM]  1513 Troponin, dimer, BNP normal  CMP reviewed, unremarkable [MM]  1515 Patient reevaluated, still complaining of some persistent chest discomfort but reassured by unremarkable workup  Abdomen benign, no clinical concern for acute intra-abdominal process.  Consider GERD, will add GI cocktail and Tylenol .  Satting 100% on room air  Lung aeration notably improved on repeat exam, do suspect some component of asthma contributing to presentation  He is now noting that  he is been having bilateral flank pain as well as some foul-smelling urine, will add urinalysis to ensure no underlying UTI [MM]    Clinical Course User Index [MM] Clarine Ozell LABOR, MD     FINAL CLINICAL IMPRESSION(S) / ED DIAGNOSES   Final diagnoses:  Exacerbation of asthma, unspecified asthma severity, unspecified whether persistent  SOB (shortness of breath)  Bilateral flank pain  Nonspecific chest pain     Rx / DC Orders   ED Discharge Orders          Ordered    albuterol  (VENTOLIN  HFA) 108 (90 Base) MCG/ACT inhaler  Every 6 hours PRN        07/03/24 1518  Note:  This document was prepared using Dragon voice recognition software and may include unintentional dictation errors.   Clarine Ozell LABOR, MD 07/03/24 1524

## 2024-07-03 NOTE — ED Provider Notes (Signed)
 Multiple medical co morbidities.  Has been doing well with treatment for reactive airway disease like symptoms. Advising need for delta trop, if normal likely dc. UA also pending.   ----------------------------------------- 6:34 PM on 07/03/2024 ----------------------------------------- Patient's initial troponin is normal his urinalysis normal.  We were awaiting these results.  He has a second troponin currently in the lab, but is advising that he must leave on account of outside extrinsic factors.  He is unable to stay to receive results of his troponin  Discussed with patient notified him we are awaiting a second heart test to make certain his chest pain pressure does not seem high risk for cardiac causes he has a strong history of heart disease he reports he knows that he has a history of heart disease but he is feeling better and needs to leave urgently.  Discussed with patient, he advises he will come back right away if he starts having any worsening symptoms severe symptoms has starts having trouble breathing again or otherwise he is awake alert well-oriented without distress.  Understanding of my recommendation he stay for additional testing rule out heart problems, or risk of heart attack but patient declines this.     Dicky Anes, MD 07/03/24 [phone]

## 2024-07-03 NOTE — ED Triage Notes (Signed)
 Pt arrives via [facility] from home for SOB. Per fire, SpO2 was 86%, improved to 100% on 3 lpm via [place]. EMS reports diminished lung sounds. Pt arrives alert, no distress at this time. Pt c/o pleuritic pain with inspiration. Pt reports the dyspnea has been worsening over a few days. Pt is not conversationally dyspneic.

## 2024-07-05 ENCOUNTER — Other Ambulatory Visit: Payer: Self-pay | Admitting: Family Medicine

## 2024-07-19 ENCOUNTER — Other Ambulatory Visit: Payer: Self-pay | Admitting: Urology

## 2024-07-27 ENCOUNTER — Other Ambulatory Visit: Payer: Self-pay | Admitting: Family Medicine

## 2024-07-27 DIAGNOSIS — R413 Other amnesia: Secondary | ICD-10-CM

## 2024-07-29 ENCOUNTER — Telehealth: Payer: Self-pay | Admitting: Family Medicine

## 2024-07-29 ENCOUNTER — Ambulatory Visit: Payer: Self-pay

## 2024-07-29 ENCOUNTER — Ambulatory Visit
Admission: RE | Admit: 2024-07-29 | Discharge: 2024-07-29 | Disposition: A | Source: Ambulatory Visit | Attending: Family Medicine | Admitting: Family Medicine

## 2024-07-29 DIAGNOSIS — R413 Other amnesia: Secondary | ICD-10-CM
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
## 2024-07-29 NOTE — Telephone Encounter (Unsigned)
 Copied from CRM #[id number]. Topic: Medical Record Request - Records Request >> Jul 29, 2024 12:25 PM Tiffini S wrote: Reason for CRM: Patient called asking for a printout of all doctor notes and patient care summary dated from 12/01/2021 to current- states that he cannot wait up to 30 days for medical records/ need all documents today and asked to pick up    Please call the patient back at [phone] for update.
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
## 2024-08-01 ENCOUNTER — Ambulatory Visit: Admitting: Family Medicine

## 2024-08-12 ENCOUNTER — Emergency Department

## 2024-08-12 ENCOUNTER — Emergency Department
Admission: EM | Admit: 2024-08-12 | Discharge: 2024-08-12 | Disposition: A | Discharge status: Home / Self Care | Attending: Emergency Medicine | Admitting: Emergency Medicine

## 2024-08-12 ENCOUNTER — Other Ambulatory Visit: Payer: Self-pay

## 2024-08-12 ENCOUNTER — Ambulatory Visit: Payer: Self-pay

## 2024-08-12 DIAGNOSIS — D72829 Elevated white blood cell count, unspecified: Secondary | ICD-10-CM | POA: Insufficient documentation

## 2024-08-12 DIAGNOSIS — I10 Essential (primary) hypertension: Secondary | ICD-10-CM | POA: Diagnosis not present

## 2024-08-12 DIAGNOSIS — K6289 Other specified diseases of anus and rectum: Secondary | ICD-10-CM | POA: Insufficient documentation

## 2024-08-12 DIAGNOSIS — N419 Inflammatory disease of prostate, unspecified: Secondary | ICD-10-CM | POA: Insufficient documentation

## 2024-08-12 DIAGNOSIS — R350 Frequency of micturition: Secondary | ICD-10-CM | POA: Diagnosis present

## 2024-08-12 LAB — URINALYSIS, ROUTINE W REFLEX MICROSCOPIC
Bilirubin Urine: NEGATIVE
Glucose, UA: NEGATIVE mg/dL
Hgb urine dipstick: NEGATIVE
Ketones, ur: NEGATIVE mg/dL
Leukocytes,Ua: NEGATIVE
Nitrite: NEGATIVE
Protein, ur: NEGATIVE mg/dL
Specific Gravity, Urine: 1.023 (ref 1.005–1.030)
pH: 5 (ref 5.0–8.0)

## 2024-08-12 LAB — CBC WITH DIFFERENTIAL/PLATELET
Abs Immature Granulocytes: 0.03 K/uL (ref 0.00–0.07)
Basophils Absolute: 0.1 K/uL (ref 0.0–0.1)
Basophils Relative: 0 %
Eosinophils Absolute: 0.3 K/uL (ref 0.0–0.5)
Eosinophils Relative: 3 %
HCT: 38.9 % — ABNORMAL LOW (ref 39.0–52.0)
Hemoglobin: 13.5 g/dL (ref 13.0–17.0)
Immature Granulocytes: 0 %
Lymphocytes Relative: 29 %
Lymphs Abs: 3.5 K/uL (ref 0.7–4.0)
MCH: 30.2 pg (ref 26.0–34.0)
MCHC: 34.7 g/dL (ref 30.0–36.0)
MCV: 87 fL (ref 80.0–100.0)
Monocytes Absolute: 1.1 K/uL — ABNORMAL HIGH (ref 0.1–1.0)
Monocytes Relative: 9 %
Neutro Abs: 7 K/uL (ref 1.7–7.7)
Neutrophils Relative %: 59 %
Platelets: 295 K/uL (ref 150–400)
RBC: 4.47 MIL/uL (ref 4.22–5.81)
RDW: 11.7 % (ref 11.5–15.5)
WBC: 11.9 K/uL — ABNORMAL HIGH (ref 4.0–10.5)
nRBC: 0 % (ref 0.0–0.2)

## 2024-08-12 LAB — BASIC METABOLIC PANEL WITH GFR
Anion gap: 13 (ref 5–15)
BUN: 13 mg/dL (ref 6–20)
CO2: 23 mmol/L (ref 22–32)
Calcium: 8.4 mg/dL — ABNORMAL LOW (ref 8.9–10.3)
Chloride: 102 mmol/L (ref 98–111)
Creatinine, Ser: 1.16 mg/dL (ref 0.61–1.24)
GFR, Estimated: >60 mL/min
Glucose, Bld: 107 mg/dL — ABNORMAL HIGH (ref 70–99)
Potassium: 3.8 mmol/L (ref 3.5–5.1)
Sodium: 138 mmol/L (ref 135–145)

## 2024-08-12 MED ORDER — IOHEXOL 300 MG/ML  SOLN
100.0000 mL | Freq: Once | INTRAMUSCULAR | Status: AC | PRN
  Administered 2024-08-12: 100 mL via INTRAVENOUS

## 2024-08-12 MED ORDER — CIPROFLOXACIN HCL 500 MG PO TABS: 500.0000 mg | ORAL_TABLET | Freq: Two times a day (BID) | ORAL | 0 refills | Status: AC

## 2024-08-12 MED ORDER — HYDROCODONE-ACETAMINOPHEN 5-325 MG PO TABS
1.0000 | ORAL_TABLET | Freq: Once | ORAL | Status: AC
  Administered 2024-08-12: 1 via ORAL
  Filled 2024-08-12: qty 1

## 2024-08-12 MED ORDER — ONDANSETRON HCL 4 MG/2ML IJ SOLN
4.0000 mg | Freq: Once | INTRAMUSCULAR | Status: AC
  Administered 2024-08-12: 4 mg via INTRAVENOUS
  Filled 2024-08-12: qty 2

## 2024-08-12 MED ORDER — SODIUM CHLORIDE 0.9 % IV BOLUS
1000.0000 mL | Freq: Once | INTRAVENOUS | Status: AC
  Administered 2024-08-12: 1000 mL via INTRAVENOUS

## 2024-08-12 MED ORDER — MORPHINE SULFATE (PF) 4 MG/ML IV SOLN
4.0000 mg | Freq: Once | INTRAVENOUS | Status: AC
  Administered 2024-08-12: 4 mg via INTRAVENOUS
  Filled 2024-08-12: qty 1

## 2024-08-12 MED ORDER — HYDROCODONE-ACETAMINOPHEN 5-325 MG PO TABS: 1.0000 | ORAL_TABLET | Freq: Four times a day (QID) | ORAL | 0 refills | Status: DC | PRN

## 2024-08-12 MED ORDER — CIPROFLOXACIN HCL 500 MG PO TABS
500.0000 mg | ORAL_TABLET | Freq: Once | ORAL | Status: AC
  Administered 2024-08-12: 500 mg via ORAL
  Filled 2024-08-12: qty 1

## 2024-08-12 NOTE — ED Provider Notes (Signed)
 "  [facility] Provider Note    Event Date/Time   First MD Initiated Contact with Patient 08/12/24 1836     (approximate)   History   Urinary Frequency   HPI  Joshua [name] is a 33 y.o. 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  No chest pain or shortness of breath, also having brownish-red stools for quite some time.  States he supposed follow back up with GI but has not.      Physical Exam   Triage Vital Signs: ED Triage Vitals  Encounter Vitals Group     BP 08/12/24 1538 136/79     Girls Systolic BP Percentile --      Girls Diastolic BP Percentile --      Boys Systolic BP Percentile --      Boys Diastolic BP Percentile --      Pulse Rate 08/12/24 1538 90     Resp 08/12/24 1538 16     Temp 08/12/24 1538 98.5 F (36.9 C)     Temp Source 08/12/24 1538 Oral     SpO2 08/12/24 1538 99 %     Weight 08/12/24 1540 185 lb (83.9 kg)     Height 08/12/24 1540 5' 10 (1.778 m)     Head Circumference --      Peak Flow --      Pain Score 08/12/24 1540 10     Pain Loc --      Pain Education --      Exclude from Growth Chart --     Most recent vital signs: Vitals:   08/12/24 1904 08/12/24 2027  BP: 136/74 109/63  Pulse: 88 85  Resp: 18 18  Temp:  98.5 F (36.9 C)  SpO2: 100% 93%     General: Awake, no distress.   CV:  Good peripheral perfusion. Resp:  Normal effort. Abd:  No distention.  Tender in right lower quadrant Other:  Rectal exam shows extreme tenderness of the prostate   ED Results / Procedures / Treatments   Labs (all labs ordered are listed, but only abnormal results are displayed) Labs Reviewed  URINALYSIS, ROUTINE W REFLEX MICROSCOPIC - Abnormal; Notable for the following components:      Result Value   Color, Urine YELLOW (*)    APPearance  HAZY (*)    All other components within normal limits  CBC WITH DIFFERENTIAL/PLATELET - Abnormal; Notable for the following components:   WBC 11.9 (*)    HCT 38.9 (*)    Monocytes Absolute 1.1 (*)    All other components within normal limits  BASIC METABOLIC PANEL WITH GFR - Abnormal; Notable for the following components:   Glucose, Bld 107 (*)    Calcium  8.4 (*)    All other components within normal limits     EKG     RADIOLOGY CT abdomen pelvis IV contrast    PROCEDURES:   Procedures  Critical Care:  no Chief Complaint  Patient presents with   Urinary Frequency      MEDICATIONS ORDERED IN ED: Medications  sodium chloride  0.9 % bolus 1,000 mL (0 mLs Intravenous Stopped 08/12/24 2036)  morphine  (PF) 4 MG/ML injection 4 mg (4 mg Intravenous Given 08/12/24 1930)  ondansetron  (ZOFRAN ) injection 4 mg (4 mg Intravenous Given 08/12/24 1930)  iohexol  (OMNIPAQUE ) 300 MG/ML  solution 100 mL (100 mLs Intravenous Contrast Given 08/12/24 1947)  ciprofloxacin  (CIPRO ) tablet 500 mg (500 mg Oral Given 08/12/24 2037)  HYDROcodone -acetaminophen  (NORCO/VICODIN) 5-325 MG per tablet 1 tablet (1 tablet Oral Given 08/12/24 2037)     IMPRESSION / MDM / ASSESSMENT AND PLAN / ED COURSE  I reviewed the triage vital signs and the nursing notes.                              Differential diagnosis includes, but is not limited to, UTI, STD, prostatitis, GI bleed, acute appendicitis, acute cholecystitis, pancreatitis  Patient's presentation is most consistent with acute illness / injury with system symptoms.    Medications given: Normal saline 1 L IV, morphine  4 mg IV, Zofran  4 mg IV, Cipro  500 mg p.o., [place] in 1 PM for discharge  Patient has not been taking his Suboxone.  This may be causing some of his discomfort.  I did have a discussion with him about this.  Patient does have a mild elevation in his WBC of 11.9, remainder of his labs are reassuring.  Due to the right lower  quadrant tenderness, prostate tenderness, we will go ahead and do CT abdomen pelvis.  CT abdomen pelvis independent review interpretation by me is negative for any acute abnormality  I will go ahead and treat the patient for prostatitis due to the exquisite prostate tenderness on exam.  Gave him a dose here in the ED prior to discharge.  I only gave him 5 pain pills.  I told him I had great concerns about giving him any pain medication due to the Suboxone use.  He states he has not been taking the Suboxone for several days.  He is to follow-up with his doctor to get his Suboxone refilled.  He is also to follow-up with his neurologist, GI specialist etc.  Now he needs to also follow-up with a neurologist.  He is in agreement with this treatment plan.  Discharged stable condition.      FINAL CLINICAL IMPRESSION(S) / ED DIAGNOSES   Final diagnoses:  Prostatitis, unspecified prostatitis type     Rx / DC Orders   ED Discharge Orders          Ordered    ciprofloxacin  (CIPRO ) 500 MG tablet  2 times daily        08/12/24 2026    HYDROcodone -acetaminophen  (NORCO/VICODIN) 5-325 MG tablet  Every 6 hours PRN        08/12/24 2026             Note:  This document was prepared using Dragon voice recognition software and may include unintentional dictation errors.    Gasper Devere ORN, PA-C 08/12/24 2334    Dicky Anes, MD 08/22/24 2049  "

## 2024-08-12 NOTE — Discharge Instructions (Signed)
 Follow-up with urology, follow-up with your neurology, after you finish the pain medication you should start back on your Suboxone.

## 2024-08-12 NOTE — ED Triage Notes (Signed)
 Pt c/o urinary frequency, dark colored urine and malodorous urine. Pt concerned for kidney infection.

## 2024-08-12 NOTE — Telephone Encounter (Signed)
 Attempted to contact pt. Pt is currently busy. Will attempt to contact pt at a later time per pt's request to call back in 20 min.   Copied from CRM #[id number]. Topic: Clinical - Red Word Triage >> Aug 12, 2024  2:59 PM Wess RAMAN wrote: Red Word that prompted transfer to Nurse Triage: Tremors in neck and hand. Back of head and arms going numb. Blood coming from rear.  No PCP. Would like an appt at [facility] >> Aug 12, 2024  3:12 PM Montie POUR wrote: He was on hold a long time and hung up. Now calling back and He wants a triage nurse to call at [phone]

## 2024-08-12 NOTE — Telephone Encounter (Signed)
Patient is currently in the ED for evaluation.

## 2024-08-19 ENCOUNTER — Other Ambulatory Visit: Payer: Self-pay | Admitting: Family

## 2024-08-22 ENCOUNTER — Encounter: Payer: Self-pay | Admitting: Emergency Medicine

## 2024-08-22 ENCOUNTER — Other Ambulatory Visit: Payer: Self-pay

## 2024-08-22 ENCOUNTER — Emergency Department
Admission: EM | Admit: 2024-08-22 | Discharge: 2024-08-22 | Disposition: A | Discharge status: Home / Self Care | Attending: Emergency Medicine | Admitting: Emergency Medicine

## 2024-08-22 DIAGNOSIS — K649 Unspecified hemorrhoids: Secondary | ICD-10-CM | POA: Insufficient documentation

## 2024-08-22 DIAGNOSIS — K6289 Other specified diseases of anus and rectum: Secondary | ICD-10-CM | POA: Diagnosis present

## 2024-08-22 LAB — CBC
HCT: 43.5 % (ref 39.0–52.0)
Hemoglobin: 14.5 g/dL (ref 13.0–17.0)
MCH: 30.1 pg (ref 26.0–34.0)
MCHC: 33.3 g/dL (ref 30.0–36.0)
MCV: 90.2 fL (ref 80.0–100.0)
Platelets: 300 K/uL (ref 150–400)
RBC: 4.82 MIL/uL (ref 4.22–5.81)
RDW: 11.9 % (ref 11.5–15.5)
WBC: 8.6 K/uL (ref 4.0–10.5)
nRBC: 0 % (ref 0.0–0.2)

## 2024-08-22 LAB — COMPREHENSIVE METABOLIC PANEL WITH GFR
ALT: 21 U/L (ref 0–44)
AST: 51 U/L — ABNORMAL HIGH (ref 15–41)
Albumin: 4.5 g/dL (ref 3.5–5.0)
Alkaline Phosphatase: 51 U/L (ref 38–126)
Anion gap: 13 (ref 5–15)
BUN: 10 mg/dL (ref 6–20)
CO2: 25 mmol/L (ref 22–32)
Calcium: 9 mg/dL (ref 8.9–10.3)
Chloride: 100 mmol/L (ref 98–111)
Creatinine, Ser: 1 mg/dL (ref 0.61–1.24)
GFR, Estimated: >60 mL/min
Glucose, Bld: 87 mg/dL (ref 70–99)
Potassium: 3.5 mmol/L (ref 3.5–5.1)
Sodium: 138 mmol/L (ref 135–145)
Total Bilirubin: 0.2 mg/dL (ref 0.0–1.2)
Total Protein: 7.1 g/dL (ref 6.5–8.1)

## 2024-08-22 LAB — TYPE AND SCREEN
ABO/RH(D): A POS
Antibody Screen: NEGATIVE

## 2024-08-22 MED ORDER — DOCUSATE SODIUM 100 MG PO CAPS: 100.0000 mg | ORAL_CAPSULE | Freq: Two times a day (BID) | ORAL | 2 refills | Status: AC

## 2024-08-22 MED ORDER — NAPROXEN 500 MG PO TABS: 500.0000 mg | ORAL_TABLET | Freq: Two times a day (BID) | ORAL | 2 refills | Status: DC

## 2024-08-22 MED ORDER — POLYETHYLENE GLYCOL 3350 17 G PO PACK: 17.0000 g | PACK | Freq: Every day | ORAL | 0 refills | Status: DC

## 2024-08-22 NOTE — ED Triage Notes (Addendum)
 C?O rectal pain for a while.  Intermittent rectal bleeding x 1 year.  Since yesterday c/o worsening rectal pain.  Currently taking antibiotics for prostatitis.  AAOx3. Skin warm and dry. Patient with multiple complaints.

## 2024-08-22 NOTE — ED Provider Notes (Signed)
 "  [facility] Provider Note    Event Date/Time   First MD Initiated Contact with Patient 08/22/24 1030     (approximate)   History   Rectal pain   HPI  Joshua [name] is a 32 y.o. male with extensive past medical history as noted in the chart who presents with complaints of rectal pain.  This worsened last night, was seen for this several weeks ago and had extensive workup in the emergency department per review of records.  He reports he feels something bulging out of his rectum and it is painful     Physical Exam   Triage Vital Signs: ED Triage Vitals  Encounter Vitals Group     BP 08/22/24 1016 (!) 163/99     Girls Systolic BP Percentile --      Girls Diastolic BP Percentile --      Boys Systolic BP Percentile --      Boys Diastolic BP Percentile --      Pulse Rate 08/22/24 1016 (!) 110     Resp 08/22/24 1016 16     Temp 08/22/24 1016 98.7 F (37.1 C)     Temp Source 08/22/24 1016 Oral     SpO2 --      Weight 08/22/24 1016 83.9 kg (184 lb 15.5 oz)     Height 08/22/24 1033 1.778 m (5' 10)     Head Circumference --      Peak Flow --      Pain Score 08/22/24 1016 10     Pain Loc --      Pain Education --      Exclude from Growth Chart --     Most recent vital signs: Vitals:   08/22/24 1016  BP: (!) 163/99  Pulse: (!) 110  Resp: 16  Temp: 98.7 F (37.1 C)     General: Awake, no distress.  CV:  Good peripheral perfusion.  Resp:  Normal effort.  Abd:  No distention.  Rectal exam demonstrates large hemorrhoid, 12:00     ED Results / Procedures / Treatments   Labs (all labs ordered are listed, but only abnormal results are displayed) Labs Reviewed  CBC  COMPREHENSIVE METABOLIC PANEL WITH GFR  URINALYSIS, ROUTINE W REFLEX MICROSCOPIC  POC OCCULT BLOOD, ED  TYPE AND SCREEN     EKG     RADIOLOGY     PROCEDURES:  Critical Care performed:   Procedures   MEDICATIONS ORDERED IN ED: Medications - No data to  display   IMPRESSION / MDM / ASSESSMENT AND PLAN / ED COURSE  I reviewed the triage vital signs and the nursing notes. Patient's presentation is most consistent with acute illness / injury with system symptoms.  Patient has hemorrhoid on exam, this is all certainly the cause of his pain.  Recommend stool softeners, MiraLAX , analgesics, close follow-up with surgery        FINAL CLINICAL IMPRESSION(S) / ED DIAGNOSES   Final diagnoses:  Hemorrhoids, unspecified hemorrhoid type     Rx / DC Orders   ED Discharge Orders          Ordered    polyethylene glycol (MIRALAX ) 17 g packet  Daily        08/22/24 1055    naproxen  (NAPROSYN ) 500 MG tablet  2 times daily with meals        08/22/24 1055    docusate sodium  (COLACE) 100 MG capsule  2 times daily  08/22/24 1055             Note:  This document was prepared using Dragon voice recognition software and may include unintentional dictation errors.   Arlander [place], MD 08/22/24 1101  "

## 2024-08-22 NOTE — ED Notes (Signed)
 See triage note  Presents with rectal pain  States pain started a couple of days ago On exam with provider small hemorrhoid noted No active bleeding

## 2024-09-05 ENCOUNTER — Ambulatory Visit: Payer: Self-pay | Admitting: Surgery

## 2024-09-05 NOTE — H&P (Signed)
 Subjective:  CC: Hemorrhoids, external [K64.4]   HPI:  referrred by Arlander  for above.   History of Present Illness Joshua [name] is a 34 year old male with asthma, seizure disorder, substance use disorder, hypertension, prior cardiac arrest, and right above-knee amputation who presents with rectal pain and new onset urinary and fecal incontinence.  He was seen in the emergency department on December 29 for rectal pain and since then has developed new urinary and fecal incontinence. The rectal pain worsens with standing. He strains with both bowel movements and urination.  He does not take pain medications and has no known drug allergies.  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  Past Surgical History:  has a past surgical history that includes Lower extremity surgery (2013); Amputation Leg Above Knee (Right, 11/03/2017); Anterior fusion clivus-C2 transoral w/o odontoid excision; disarticulation at knee (11/03/2017); DIALYSIS/PERMA CATHETER INSERTION  (11/16/2017); DIALYSIS/PERMA CATHETER REMOVAL (12/14/2017); COLON@[facility]  (04/13/2024); EGD@[facility]  (04/13/2024); and CONDYLOMA REMOVAL WITH Co2 LASER (03/19/2021).  Family History: family history includes Bipolar disorder in his mother; Depression in his mother.  Social History:  reports that he has been smoking cigarettes. He started smoking about 20 years ago. He has a  10 pack-year smoking history. He has been exposed to tobacco smoke. He has never used smokeless tobacco. He reports that he does not currently use alcohol. He reports that he does not currently use drugs after having used the following drugs: Other-see comments.  Current Medications: has a current medication list which includes the following prescription(s): buprenorphine-naloxone , dextroamphetamine-amphetamine, famotidine , gabapentin , lamotrigine , lisinopril , nortriptyline, albuterol  mdi (proventil , ventolin , proair ) hfa, alfuzosin , amlodipine , aripiprazole, aripiprazole, aripiprazole, lamotrigine , and nortriptyline.  Allergies:  Allergies as of 09/05/2024 - Reviewed 09/05/2024  Allergen Reaction Noted   Codeine  Other (See Comments) 08/02/2018   Sertraline hcl Unknown 07/27/2024   Tramadol  Unknown 12/23/2023   Sertraline Hives and Rash 02/06/2021    ROS:  A 15 point review of systems was performed and pertinent positives and negatives noted in HPI  Objective:     BP 126/82   Pulse 106   Ht 172.7 cm (5' 8)   Wt 85.3 kg (188 lb)   BMI 28.59 kg/m   Constitutional :  No distress, cooperative, alert  Lymphatics/Throat:  Supple with no lymphadenopathy  Respiratory:  Clear to auscultation bilaterally  Cardiovascular:  Regular rate and rhythm  Gastrointestinal: Soft, non-tender, non-distended, no organomegaly.  Musculoskeletal: Steady gait and movement  Skin: Cool and moist  Psychiatric: Normal affect, non-agitated, not confused  Rectal: Chaperone present for exam. External exam consistent with hemorrhoids that is TTP.  Further exam deferred secondary to pain.  No obvious clot formation.      LABS:  N/a   RADS: N/a  Assessment:     Hemorrhoids, external [K64.4] - due to symptomatic nature, recommended hemorrhoidectomy  Plan:    1. Hemorrhoids, external [K64.4] Discussed risks/benefits/alternatives to surgery.  Alternatives include the options of observation, medical  management.  Benefits include symptomatic relief.  I discussed  in detail and the complications related to the operation and the anesthesia, including bleeding, infection, recurrence,  remote possibility of temporary or permanent fecal incontinence, poor/delayed wound healing, chronic pain, and additional procedures to address said risks. The risks of general anesthetic, if used, includes MI, CVA, sudden death or even reaction to anesthetic medications also discussed.   We also discussed typical post operative recovery which includes weeks to potentially months of anal pain, drainage, occasional bleeding, and sense of fecal urgency.    ED return precautions given for sudden increase in pain, bleeding, with possible accompanying fever, nausea, and/or vomiting.  The patient understands the risks, any and all questions were answered to the patient's satisfaction.  2. Patient has elected to proceed with surgical treatment. Procedure will be scheduled. PRONE jackknife position due to hx of AKA.  labs/images/medications/previous chart entries reviewed personally and relevant changes/updates noted above.

## 2024-09-05 NOTE — H&P (View-Only) (Signed)
 Subjective:  CC: Hemorrhoids, external [K64.4]   HPI:  referrred by Arlander  for above.   History of Present Illness Joshua [name] is a 34 year old male with asthma, seizure disorder, substance use disorder, hypertension, prior cardiac arrest, and right above-knee amputation who presents with rectal pain and new onset urinary and fecal incontinence.  He was seen in the emergency department on December 29 for rectal pain and since then has developed new urinary and fecal incontinence. The rectal pain worsens with standing. He strains with both bowel movements and urination.  He does not take pain medications and has no known drug allergies.  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  Past Surgical History:  has a past surgical history that includes Lower extremity surgery (2013); Amputation Leg Above Knee (Right, 11/03/2017); Anterior fusion clivus-C2 transoral w/o odontoid excision; disarticulation at knee (11/03/2017); DIALYSIS/PERMA CATHETER INSERTION  (11/16/2017); DIALYSIS/PERMA CATHETER REMOVAL (12/14/2017); COLON@[facility]  (04/13/2024); EGD@[facility]  (04/13/2024); and CONDYLOMA REMOVAL WITH Co2 LASER (03/19/2021).  Family History: family history includes Bipolar disorder in his mother; Depression in his mother.  Social History:  reports that he has been smoking cigarettes. He started smoking about 20 years ago. He has a  10 pack-year smoking history. He has been exposed to tobacco smoke. He has never used smokeless tobacco. He reports that he does not currently use alcohol. He reports that he does not currently use drugs after having used the following drugs: Other-see comments.  Current Medications: has a current medication list which includes the following prescription(s): buprenorphine-naloxone , dextroamphetamine-amphetamine, famotidine , gabapentin , lamotrigine , lisinopril , nortriptyline, albuterol  mdi (proventil , ventolin , proair ) hfa, alfuzosin , amlodipine , aripiprazole, aripiprazole, aripiprazole, lamotrigine , and nortriptyline.  Allergies:  Allergies as of 09/05/2024 - Reviewed 09/05/2024  Allergen Reaction Noted   Codeine  Other (See Comments) 08/02/2018   Sertraline hcl Unknown 07/27/2024   Tramadol  Unknown 12/23/2023   Sertraline Hives and Rash 02/06/2021    ROS:  A 15 point review of systems was performed and pertinent positives and negatives noted in HPI  Objective:     BP 126/82   Pulse 106   Ht 172.7 cm (5' 8)   Wt 85.3 kg (188 lb)   BMI 28.59 kg/m   Constitutional :  No distress, cooperative, alert  Lymphatics/Throat:  Supple with no lymphadenopathy  Respiratory:  Clear to auscultation bilaterally  Cardiovascular:  Regular rate and rhythm  Gastrointestinal: Soft, non-tender, non-distended, no organomegaly.  Musculoskeletal: Steady gait and movement  Skin: Cool and moist  Psychiatric: Normal affect, non-agitated, not confused  Rectal: Chaperone present for exam. External exam consistent with hemorrhoids that is TTP.  Further exam deferred secondary to pain.  No obvious clot formation.      LABS:  N/a   RADS: N/a  Assessment:     Hemorrhoids, external [K64.4] - due to symptomatic nature, recommended hemorrhoidectomy  Plan:    1. Hemorrhoids, external [K64.4] Discussed risks/benefits/alternatives to surgery.  Alternatives include the options of observation, medical  management.  Benefits include symptomatic relief.  I discussed  in detail and the complications related to the operation and the anesthesia, including bleeding, infection, recurrence,  remote possibility of temporary or permanent fecal incontinence, poor/delayed wound healing, chronic pain, and additional procedures to address said risks. The risks of general anesthetic, if used, includes MI, CVA, sudden death or even reaction to anesthetic medications also discussed.   We also discussed typical post operative recovery which includes weeks to potentially months of anal pain, drainage, occasional bleeding, and sense of fecal urgency.    ED return precautions given for sudden increase in pain, bleeding, with possible accompanying fever, nausea, and/or vomiting.  The patient understands the risks, any and all questions were answered to the patient's satisfaction.  2. Patient has elected to proceed with surgical treatment. Procedure will be scheduled. PRONE jackknife position due to hx of AKA.  labs/images/medications/previous chart entries reviewed personally and relevant changes/updates noted above.

## 2024-09-07 ENCOUNTER — Encounter
Admission: RE | Admit: 2024-09-07 | Discharge: 2024-09-07 | Disposition: A | Discharge status: Home / Self Care | Source: Ambulatory Visit | Attending: Surgery | Admitting: Surgery

## 2024-09-07 ENCOUNTER — Other Ambulatory Visit: Payer: Self-pay

## 2024-09-07 HISTORY — DX: Male erectile dysfunction, unspecified: N52.9

## 2024-09-07 HISTORY — DX: Opioid dependence, in remission: F11.21

## 2024-09-07 HISTORY — DX: Anemia, unspecified: D64.9

## 2024-09-07 HISTORY — DX: Tremor, unspecified: R25.1

## 2024-09-07 HISTORY — DX: Tobacco use: Z72.0

## 2024-09-07 HISTORY — DX: Other complications of anesthesia, initial encounter: T88.59XA

## 2024-09-07 HISTORY — DX: Acquired absence of right leg above knee: Z89.611

## 2024-09-07 HISTORY — DX: Major depressive disorder, single episode, unspecified: F32.9

## 2024-09-07 HISTORY — DX: Cocaine abuse, uncomplicated: F14.10

## 2024-09-07 HISTORY — DX: Unspecified hemorrhoids: K64.9

## 2024-09-07 NOTE — Pre-Procedure Instructions (Signed)
 Pt states he is not able to get into my chart. Surgery Instructions emailed to [email or web address] per pt request and instructions reviewed over phone as well

## 2024-09-07 NOTE — Patient Instructions (Addendum)
 Your procedure is scheduled on:09-08-24 Thursday Report to the Registration Desk on the 1st floor of the [facility].Then proceed to the 2nd floor Surgery Desk To find out your arrival time, please call [phone] between 1PM - 3PM on:09-07-24 Wednesday If your arrival time is 6:00 am, do not arrive before that time as the [facility] entrance doors do not open until 6:00 am.  REMEMBER: Instructions that are not followed completely may result in serious medical risk, up to and including death; or upon the discretion of your surgeon and anesthesiologist your surgery may need to be rescheduled.  Do not eat food after midnight the night before surgery.  No gum chewing or hard candies.  You may however, drink CLEAR liquids up to 2 hours before you are scheduled to arrive for your surgery. Do not drink anything within 2 hours of your scheduled arrival time.  Clear liquids include: - water   - apple juice without pulp - gatorade (not RED colors) - black coffee or tea (Do NOT add milk or creamers to the coffee or tea) Do NOT drink anything that is not on this list.  One week prior to surgery:Stop NOW (09-07-24) Stop Anti-inflammatories (NSAIDS) such as Advil , Aleve , Ibuprofen , Motrin , Naproxen , Naprosyn  and Aspirin based products such as Excedrin, Goody's Powder, BC Powder.  You may however, continue to take Tylenol  if needed for pain up until the day of surgery  Continue taking all of your other prescription medications up until the day of surgery.  ON THE DAY OF SURGERY ONLY TAKE THESE MEDICATIONS WITH SIPS OF WATER : -amLODipine  (NORVASC )  -busPIRone  (BUSPAR )  -divalproex  (DEPAKOTE )  -gabapentin  (NEURONTIN )  -lamoTRIgine  (LAMICTAL )  -omeprazole  (PRILOSEC)   Use your Advair  Inhaler the day of surgery and bring your Albuterol  Inhaler to the hospital  No Alcohol for 24 hours before or after surgery.  No Smoking including e-cigarettes for 24 hours before surgery.  No chewable tobacco  products for at least 6 hours before surgery.  No nicotine  patches on the day of surgery.  Do not use any recreational drugs for at least a week (preferably 2 weeks) before your surgery.  Please be advised that the combination of cocaine and anesthesia may have negative outcomes, up to and including death. If you test positive for cocaine, your surgery will be cancelled.  On the morning of surgery brush your teeth with toothpaste and water , you may rinse your mouth with mouthwash if you wish. Do not swallow any toothpaste or mouthwash.  Do not wear jewelry, make-up, hairpins, clips or nail polish.  For welded (permanent) jewelry: bracelets, anklets, waist bands, etc.  Please have this removed prior to surgery.  If it is not removed, there is a chance that hospital personnel will need to cut it off on the day of surgery.  Do not wear lotions, powders, or perfumes.   Do not shave body hair from the neck down 48 hours before surgery.  Contact lenses, hearing aids and dentures may not be worn into surgery.  Do not bring valuables to the hospital. [facility] is not responsible for any missing/lost belongings or valuables.   Notify your doctor if there is any change in your medical condition (cold, fever, infection).  Wear comfortable clothing (specific to your surgery type) to the hospital.  After surgery, you can help prevent lung complications by doing breathing exercises.  Take deep breaths and cough every 1-2 hours. Your doctor may order a device called an Incentive Spirometer to help you  take deep breaths. When coughing or sneezing, hold a pillow firmly against your incision with both hands. This is called splinting. Doing this helps protect your incision. It also decreases belly discomfort.  If you are being admitted to the hospital overnight, leave your suitcase in the car. After surgery it may be brought to your room.  In case of increased patient census, it may be necessary  for you, the patient, to continue your postoperative care in the Same Day Surgery department.  If you are being discharged the day of surgery, you will not be allowed to drive home. You will need a responsible individual to drive you home and stay with you for 24 hours after surgery.   If you are taking public transportation, you will need to have a responsible individual with you.  Please call the Pre-admissions Testing Dept. at [phone] if you have any questions about these instructions.  Surgery Visitation Policy:  Patients having surgery or a procedure may have two visitors.  Children under the age of 12 must have an adult with them who is not the patient   Merchandiser, Retail to address health-related social needs:  [email or web address]

## 2024-09-08 ENCOUNTER — Other Ambulatory Visit: Payer: Self-pay

## 2024-09-08 ENCOUNTER — Ambulatory Visit: Admitting: Anesthesiology

## 2024-09-08 ENCOUNTER — Ambulatory Visit
Admission: RE | Admit: 2024-09-08 | Discharge: 2024-09-08 | Disposition: A | Discharge status: Home / Self Care | Attending: Surgery | Admitting: Surgery

## 2024-09-08 ENCOUNTER — Encounter: Payer: Self-pay | Admitting: Surgery

## 2024-09-08 ENCOUNTER — Encounter: Admission: RE | Disposition: A | Payer: Self-pay | Source: Home / Self Care | Attending: Surgery

## 2024-09-08 ENCOUNTER — Ambulatory Visit: Admitting: Urgent Care

## 2024-09-08 DIAGNOSIS — F1410 Cocaine abuse, uncomplicated: Secondary | ICD-10-CM | POA: Insufficient documentation

## 2024-09-08 DIAGNOSIS — G40909 Epilepsy, unspecified, not intractable, without status epilepticus: Secondary | ICD-10-CM | POA: Diagnosis not present

## 2024-09-08 DIAGNOSIS — K642 Third degree hemorrhoids: Secondary | ICD-10-CM | POA: Diagnosis present

## 2024-09-08 DIAGNOSIS — F17210 Nicotine dependence, cigarettes, uncomplicated: Secondary | ICD-10-CM | POA: Insufficient documentation

## 2024-09-08 DIAGNOSIS — K644 Residual hemorrhoidal skin tags: Secondary | ICD-10-CM | POA: Insufficient documentation

## 2024-09-08 DIAGNOSIS — G709 Myoneural disorder, unspecified: Secondary | ICD-10-CM | POA: Diagnosis not present

## 2024-09-08 DIAGNOSIS — J45909 Unspecified asthma, uncomplicated: Secondary | ICD-10-CM | POA: Insufficient documentation

## 2024-09-08 DIAGNOSIS — I10 Essential (primary) hypertension: Secondary | ICD-10-CM | POA: Insufficient documentation

## 2024-09-08 HISTORY — PX: HEMORRHOID SURGERY: SHX153

## 2024-09-08 MED ORDER — KETAMINE HCL 50 MG/5ML IJ SOSY
PREFILLED_SYRINGE | INTRAMUSCULAR | Status: DC | PRN
  Administered 2024-09-08: 30 mg via INTRAVENOUS

## 2024-09-08 MED ORDER — MIDAZOLAM HCL 2 MG/2ML IJ SOLN
INTRAMUSCULAR | Status: AC
  Filled 2024-09-08: qty 2

## 2024-09-08 MED ORDER — DOCUSATE SODIUM 100 MG PO CAPS
100.0000 mg | ORAL_CAPSULE | Freq: Two times a day (BID) | ORAL | 0 refills | Status: AC | PRN
  Filled 2024-09-08: qty 20, 10d supply, fill #0

## 2024-09-08 MED ORDER — IPRATROPIUM-ALBUTEROL 0.5-2.5 (3) MG/3ML IN SOLN
RESPIRATORY_TRACT | Status: AC
  Filled 2024-09-08: qty 3

## 2024-09-08 MED ORDER — LIDOCAINE HCL (CARDIAC) PF 100 MG/5ML IV SOSY
PREFILLED_SYRINGE | INTRAVENOUS | Status: DC | PRN
  Administered 2024-09-08: 100 mg via INTRAVENOUS

## 2024-09-08 MED ORDER — CEFAZOLIN SODIUM-DEXTROSE 2-4 GM/100ML-% IV SOLN
2.0000 g | INTRAVENOUS | Status: AC
  Administered 2024-09-08: 2 g via INTRAVENOUS

## 2024-09-08 MED ORDER — SUGAMMADEX SODIUM 200 MG/2ML IV SOLN
INTRAVENOUS | Status: DC | PRN
  Administered 2024-09-08: 200 mg via INTRAVENOUS

## 2024-09-08 MED ORDER — KETOROLAC TROMETHAMINE 30 MG/ML IJ SOLN
INTRAMUSCULAR | Status: DC | PRN
  Administered 2024-09-08: 30 mg via INTRAVENOUS

## 2024-09-08 MED ORDER — BUPIVACAINE-EPINEPHRINE (PF) 0.5% -1:200000 IJ SOLN
INTRAMUSCULAR | Status: DC | PRN
  Administered 2024-09-08: 10 mL

## 2024-09-08 MED ORDER — GLYCOPYRROLATE 0.2 MG/ML IJ SOLN
INTRAMUSCULAR | Status: DC | PRN
  Administered 2024-09-08: .2 mg via INTRAVENOUS

## 2024-09-08 MED ORDER — GELATIN ABSORBABLE 100 EX MISC
CUTANEOUS | Status: DC | PRN
  Administered 2024-09-08: 1

## 2024-09-08 MED ORDER — GLYCOPYRROLATE 0.2 MG/ML IJ SOLN
INTRAMUSCULAR | Status: AC
  Filled 2024-09-08: qty 1

## 2024-09-08 MED ORDER — CEFAZOLIN SODIUM-DEXTROSE 2-4 GM/100ML-% IV SOLN
INTRAVENOUS | Status: AC
  Filled 2024-09-08: qty 100

## 2024-09-08 MED ORDER — IBUPROFEN 800 MG PO TABS
800.0000 mg | ORAL_TABLET | Freq: Three times a day (TID) | ORAL | 0 refills | Status: AC | PRN
  Filled 2024-09-08: qty 30, 10d supply, fill #0

## 2024-09-08 MED ORDER — PROPOFOL 10 MG/ML IV BOLUS
INTRAVENOUS | Status: DC | PRN
  Administered 2024-09-08: 200 mg via INTRAVENOUS

## 2024-09-08 MED ORDER — DEXAMETHASONE SOD PHOSPHATE PF 10 MG/ML IJ SOLN
INTRAMUSCULAR | Status: DC | PRN
  Administered 2024-09-08: 10 mg via INTRAVENOUS

## 2024-09-08 MED ORDER — ACETAMINOPHEN 500 MG PO TABS
ORAL_TABLET | ORAL | Status: AC
  Filled 2024-09-08: qty 2

## 2024-09-08 MED ORDER — FENTANYL CITRATE (PF) 100 MCG/2ML IJ SOLN
INTRAMUSCULAR | Status: DC | PRN
  Administered 2024-09-08: 50 ug via INTRAVENOUS

## 2024-09-08 MED ORDER — MIDAZOLAM HCL (PF) 2 MG/2ML IJ SOLN
INTRAMUSCULAR | Status: DC | PRN
  Administered 2024-09-08: 2 mg via INTRAVENOUS

## 2024-09-08 MED ORDER — GELATIN ABSORBABLE 12-7 MM EX MISC
CUTANEOUS | Status: AC
  Filled 2024-09-08: qty 1

## 2024-09-08 MED ORDER — ONDANSETRON HCL 4 MG/2ML IJ SOLN
INTRAMUSCULAR | Status: DC | PRN
  Administered 2024-09-08: 4 mg via INTRAVENOUS

## 2024-09-08 MED ORDER — DEXMEDETOMIDINE HCL IN NACL 80 MCG/20ML IV SOLN
INTRAVENOUS | Status: DC | PRN
  Administered 2024-09-08: 12 ug via INTRAVENOUS

## 2024-09-08 MED ORDER — IPRATROPIUM-ALBUTEROL 0.5-2.5 (3) MG/3ML IN SOLN
3.0000 mL | Freq: Once | RESPIRATORY_TRACT | Status: AC
  Administered 2024-09-08: 3 mL via RESPIRATORY_TRACT

## 2024-09-08 MED ORDER — KETAMINE HCL 50 MG/5ML IJ SOSY
PREFILLED_SYRINGE | INTRAMUSCULAR | Status: AC
  Filled 2024-09-08: qty 5

## 2024-09-08 MED ORDER — CHLORHEXIDINE GLUCONATE CLOTH 2 % EX PADS: 6.0000 | MEDICATED_PAD | Freq: Once | CUTANEOUS | Status: DC

## 2024-09-08 MED ORDER — CHLORHEXIDINE GLUCONATE 0.12 % MT SOLN
OROMUCOSAL | Status: AC
  Filled 2024-09-08: qty 15

## 2024-09-08 MED ORDER — OXYCODONE-ACETAMINOPHEN 5-325 MG PO TABS
1.0000 | ORAL_TABLET | Freq: Three times a day (TID) | ORAL | 0 refills | Status: AC | PRN
  Filled 2024-09-08: qty 6, 2d supply, fill #0

## 2024-09-08 MED ORDER — ACETAMINOPHEN 325 MG PO TABS
650.0000 mg | ORAL_TABLET | Freq: Three times a day (TID) | ORAL | 0 refills | Status: AC | PRN
  Filled 2024-09-08: qty 40, 7d supply, fill #0

## 2024-09-08 MED ORDER — PROPOFOL 10 MG/ML IV BOLUS
INTRAVENOUS | Status: AC
  Filled 2024-09-08: qty 20

## 2024-09-08 MED ORDER — ORAL CARE MOUTH RINSE: 15.0000 mL | Freq: Once | OROMUCOSAL | Status: AC

## 2024-09-08 MED ORDER — FENTANYL CITRATE (PF) 100 MCG/2ML IJ SOLN
INTRAMUSCULAR | Status: AC
  Filled 2024-09-08: qty 2

## 2024-09-08 MED ORDER — LACTATED RINGERS IV SOLN: INTRAVENOUS | Status: DC

## 2024-09-08 MED ORDER — 0.9 % SODIUM CHLORIDE (POUR BTL) OPTIME
TOPICAL | Status: DC | PRN
  Administered 2024-09-08: 1000 mL

## 2024-09-08 MED ORDER — LIDOCAINE 5 % EX OINT
1.0000 | TOPICAL_OINTMENT | Freq: Three times a day (TID) | CUTANEOUS | 0 refills | Status: AC | PRN
  Filled 2024-09-08: qty 50, 30d supply, fill #0

## 2024-09-08 MED ORDER — FENTANYL CITRATE (PF) 100 MCG/2ML IJ SOLN: 25.0000 ug | INTRAMUSCULAR | Status: DC | PRN

## 2024-09-08 MED ORDER — OXYCODONE HCL 5 MG PO TABS: 5.0000 mg | ORAL_TABLET | Freq: Once | ORAL | Status: DC | PRN

## 2024-09-08 MED ORDER — BUPIVACAINE-EPINEPHRINE (PF) 0.5% -1:200000 IJ SOLN
INTRAMUSCULAR | Status: AC
  Filled 2024-09-08: qty 30

## 2024-09-08 MED ORDER — BUPIVACAINE LIPOSOME 1.3 % IJ SUSP
INTRAMUSCULAR | Status: DC | PRN
  Administered 2024-09-08: 20 mL

## 2024-09-08 MED ORDER — CHLORHEXIDINE GLUCONATE 0.12 % MT SOLN
15.0000 mL | Freq: Once | OROMUCOSAL | Status: AC
  Administered 2024-09-08: 15 mL via OROMUCOSAL

## 2024-09-08 MED ORDER — ROCURONIUM BROMIDE 100 MG/10ML IV SOLN
INTRAVENOUS | Status: DC | PRN
  Administered 2024-09-08: 70 mg via INTRAVENOUS

## 2024-09-08 MED ORDER — ACETAMINOPHEN 500 MG PO TABS
1000.0000 mg | ORAL_TABLET | ORAL | Status: AC
  Administered 2024-09-08: 1000 mg via ORAL

## 2024-09-08 MED ORDER — BUPIVACAINE LIPOSOME 1.3 % IJ SUSP
INTRAMUSCULAR | Status: AC
  Filled 2024-09-08: qty 20

## 2024-09-08 MED ORDER — ALBUTEROL SULFATE HFA 108 (90 BASE) MCG/ACT IN AERS
INHALATION_SPRAY | RESPIRATORY_TRACT | Status: AC
  Filled 2024-09-08: qty 6.7

## 2024-09-08 MED ORDER — SUGAMMADEX SODIUM 200 MG/2ML IV SOLN: INTRAVENOUS | Status: DC | PRN

## 2024-09-08 MED ORDER — OXYCODONE HCL 5 MG/5ML PO SOLN: 5.0000 mg | Freq: Once | ORAL | Status: DC | PRN

## 2024-09-08 NOTE — Transfer of Care (Signed)
 Immediate Anesthesia Transfer of Care Note  Patient: Joshua [name]  Procedure(s) Performed: HEMORRHOIDECTOMY (Rectum)  Patient Location: PACU  Anesthesia Type:General  Level of Consciousness: drowsy and patient cooperative  Airway & Oxygen Therapy: Patient Spontanous Breathing and Patient connected to face mask oxygen  Post-op Assessment: Report given to RN, Post -op Vital signs reviewed and stable, and Patient moving all extremities X 4  Post vital signs: Reviewed and stable  Last Vitals:  Vitals Value Taken Time  BP 137/92 09/08/24 10:45  Temp    Pulse 96 09/08/24 10:47  Resp 11 09/08/24 10:47  SpO2 100 % 09/08/24 10:47  Vitals shown include unfiled device data.  Last Pain:  Vitals:   09/08/24 0828  TempSrc:   PainSc: 6       Patients Stated Pain Goal: 2 (09/08/24 0825)  Complications: No notable events documented.

## 2024-09-08 NOTE — Anesthesia Postprocedure Evaluation (Signed)
"   Anesthesia Post Note  Patient: Joshua [name]  Procedure(s) Performed: HEMORRHOIDECTOMY (Rectum)  Patient location during evaluation: PACU Anesthesia Type: General Level of consciousness: awake and alert Pain management: pain level controlled Vital Signs Assessment: post-procedure vital signs reviewed and stable Respiratory status: spontaneous breathing, nonlabored ventilation, respiratory function stable and patient connected to nasal cannula oxygen Cardiovascular status: blood pressure returned to baseline and stable Postop Assessment: no apparent nausea or vomiting Anesthetic complications: no   No notable events documented.   Last Vitals:  Vitals:   09/08/24 1100 09/08/24 1115  BP: 139/83 122/71  Pulse: 91 92  Resp: 13 13  Temp:    SpO2: 100% 100%    Last Pain:  Vitals:   09/08/24 1040  TempSrc:   PainSc: Asleep                 [name]      "

## 2024-09-08 NOTE — Op Note (Signed)
 Preoperative diagnosis: Third-degree degree hemorrhoids.   Postoperative diagnosis: Same  Procedure: exam under anesthesia, 2 column hemorrhoidectomy.  Surgeon: Tye  Anesthesia: general  Specimen: hemorrhoids x2  Complications: none  EBL: 15mL  Wound classification: Clean Contaminated  Indications: Patient is a 34 y.o. male was found to have symptomatic hemorrhoids refractory to medical management.   Findings: 1. third  degree hemorrhoids 2. Internal and external anal sphincter palpated and preserved 3. Adequate hemostasis  Description of procedure: The patient was brought to the operating room and general anesthesia was induced. Patient was placed high lithotomy position. A time-out was completed verifying correct patient, procedure, site, positioning, and implant(s) and/or special equipment prior to beginning this procedure. The perineum was prepped and draped in standard sterile fashion. Local anesthetic was injected as a perianal block. An anoscope was introduced and hemorrhoidal pedicles identified.  A harmonic was placed across the base of the right posterior pedicle and excess hemorrhoidal tissue removed.  Specimen was passed off operative field pending pathology.  3-0 Vicryl then used to close the open wound.  A harmonic was placed across the base of the left lateral pedicle and excess hemorrhoidal tissue removed.  Specimen was passed off operative field pending pathology.  3-0 Vicryl then used to close the open wound.  Last inspection of the anal canal did not note any additional hemorrhoidal tissue and no other pathology. Exparel  injected as a perianal block. A gauze pad was tucked between the gluteal folds, and secured in place with mesh underwear.  The patient tolerated the procedure well. Sponge and instrument count correct at end of procedure.

## 2024-09-08 NOTE — Anesthesia Procedure Notes (Signed)
 Procedure Name: Intubation Date/Time: 09/08/2024 9:35 AM  Performed by: Lennie Lamarr HERO, CRNAPre-anesthesia Checklist: Patient identified, Emergency Drugs available, Suction available and Patient being monitored Patient Re-evaluated:Patient Re-evaluated prior to induction Oxygen Delivery Method: Circle System Utilized Preoxygenation: Pre-oxygenation with 100% oxygen Induction Type: IV induction Ventilation: Mask ventilation without difficulty Laryngoscope Size: McGrath and 4 Grade View: Grade I Tube type: Oral Tube size: 7.5 mm Number of attempts: 1 Airway Equipment and Method: Stylet and Oral airway Placement Confirmation: ETT inserted through vocal cords under direct vision, positive ETCO2 and breath sounds checked- equal and bilateral Secured at: 23 cm Tube secured with: Tape Dental Injury: Teeth and Oropharynx as per pre-operative assessment

## 2024-09-08 NOTE — Discharge Instructions (Signed)
 Hemorrhoids, Care After This sheet gives you information about how to care for yourself after your procedure. Your health care provider may also give you more specific instructions. If you have problems or questions, contact your health care provider. What can I expect after the procedure? After the procedure, it is common to have: Soreness. Bruising. Itching.  Follow these instructions at home: site care Follow instructions from your health care provider about how to take care of your site. Make sure you: LEAVE packing in place until it falls out on its own.  No need to replace afterwards Leave stitches (sutures), skin glue, or adhesive strips in place.  If the area bleeds or bruises, apply gentle pressure for 10 minutes. OK TO SHOWER IN 24HRS  General instructions Rest and then return to your normal activities as told by your health care provider. tylenol and advil as needed for discomfort.  Please alternate between the two every four hours as needed for pain.    Use narcotics, if prescribed, only when tylenol and motrin is not enough to control pain.  325-650mg  every 8hrs to max of 3000mg /24hrs (including the 325mg  in every norco dose) for the tylenol.    Advil up to 800mg  per dose every 8hrs as needed for pain.   Keep all follow-up visits as told by your health care provider. This is important. Contact a health care provider if you have excessive: redness, swelling, or pain around your site. blood coming from your site. pus or a bad smell coming from your site. You have a fever.  Get help right away if: You have bleeding that does not stop with pressure or a dressing. Summary After the procedure, it is common to have some soreness, bruising, and itching at the site. Follow instructions from your health care provider about how to take care of your site. Keep all follow-up visits as told by your health care provider. This is important. This information is not intended to replace  advice given to you by your health care provider. Make sure you discuss any questions you have with your health care provider. Document Released: 09/07/2015 Document Revised: 02/08/2018 Document Reviewed: 02/08/2018 Elsevier Interactive Patient Education  [employer].

## 2024-09-08 NOTE — Anesthesia Preprocedure Evaluation (Signed)
 "                                  Anesthesia Evaluation  Patient identified by MRN, date of birth, ID band Patient awake    Reviewed: Allergy & Precautions, NPO status , Patient's Chart, lab work & pertinent test results  History of Anesthesia Complications (+) AWARENESS UNDER ANESTHESIA and history of anesthetic complications  Airway Mallampati: III  TM Distance: >3 FB Neck ROM: full    Dental  (+) Chipped   Pulmonary asthma , Current Smoker and Patient abstained from smoking.   Pulmonary exam normal        Cardiovascular hypertension, On Medications negative cardio ROS Normal cardiovascular exam     Neuro/Psych Seizures -, Poorly Controlled,  PSYCHIATRIC DISORDERS Anxiety Depression     Neuromuscular disease    GI/Hepatic negative GI ROS,,,(+)     substance abuse  cocaine use  Endo/Other  negative endocrine ROS    Renal/GU      Musculoskeletal  (+)  narcotic dependent  Abdominal   Peds  Hematology negative hematology ROS (+)   Anesthesia Other Findings Past Medical History: 2019: Acute renal failure (ARF) No date: Adjustment disorder with mixed anxiety and depressed mood No date: Anemia 2019: Aspiration pneumonia (HCC) No date: Asthma No date: Bronchitis 10/2017: Cardiac arrest (HCC)     Comment:  found unresponsive in hotel room,with a white powder               crushed up on a table that he had apparently been               snorting through a rolled up dollar bill. Went into               Cardiac arrest with EMS while en route to hospital 2019: Cardiogenic shock (HCC) No date: Cocaine abuse (HCC) No date: Complication of anesthesia     Comment:  woke up during Right AKA No date: Drug overdose No date: ED (erectile dysfunction) 10/2017: Gangrene (HCC)     Comment:  due to severe ischemia and rhabdomyolysis. No date: Generalized anxiety disorder No date: Hemorrhoids No date: Hernia cerebri (HCC) No date: History of acute  respiratory failure No date: Hypertension No date: MDD (major depressive disorder) No date: Neuropathy No date: Opioid use disorder, moderate, in sustained remission (HCC) No date: Polysubstance abuse (HCC) 2019: Rhabdomyolysis No date: S/P AKA (above knee amputation), right (HCC) No date: Seizures (HCC)     Comment:  last seizure was sometime in December 2025 No date: Tobacco abuse No date: Tremor 10/2017: Ventricular fibrillation [facility])  Past Surgical History: 11/03/2017: AMPUTATION; Right     Comment:  Procedure: KNEE DISARTICULATION;  Surgeon: Jama Cordella MATSU, MD;  Location: [facility] ORS;  Service: Vascular;                Laterality: Right; 11/10/2017: AMPUTATION; Right     Comment:  Procedure: AMPUTATION ABOVE KNEE;  Surgeon: Jama Cordella MATSU, MD;  Location: [facility] ORS;  Service: Vascular;                Laterality: Right; No date: ankle/foot surgery with metal plates; Bilateral No date: CERVICAL SPINE SURGERY 04/13/2024: COLONOSCOPY; N/A     Comment:  Procedure: COLONOSCOPY;  Surgeon: Unk Corinn Skiff,               MD;  Location: [facility] ENDOSCOPY;  Service: Endoscopy;                Laterality: N/A; 03/19/2021: CONDYLOMA EXCISION/FULGURATION; N/A     Comment:  Procedure: CONDYLOMA REMOVAL WITH Co2 LASER;  Surgeon:               Twylla Glendia BROCKS, MD;  Location: [facility] ORS;  Service:               Urology;  Laterality: N/A; 11/16/2017: DIALYSIS/PERMA CATHETER INSERTION; N/A     Comment:  Procedure: DIALYSIS/PERMA CATHETER INSERTION;  Surgeon:               Marea Selinda RAMAN, MD;  Location: [facility] INVASIVE CV LAB;                Service: Cardiovascular;  Laterality: N/A; 12/14/2017: DIALYSIS/PERMA CATHETER REMOVAL; N/A     Comment:  Procedure: DIALYSIS/PERMA CATHETER REMOVAL;  Surgeon:               Marea Selinda RAMAN, MD;  Location: [facility] INVASIVE CV LAB;                Service: Cardiovascular;  Laterality: N/A; 04/13/2024: ESOPHAGOGASTRODUODENOSCOPY; N/A      Comment:  Procedure: EGD (ESOPHAGOGASTRODUODENOSCOPY);  Surgeon:               Unk Corinn Skiff, MD;  Location: [facility] ENDOSCOPY;                Service: Endoscopy;  Laterality: N/A; No date: permacath to right side     Comment:  Scheduled to be removed  BMI    Body Mass Index: 26.95 kg/m      Reproductive/Obstetrics negative OB ROS                              Anesthesia Physical Anesthesia Plan  ASA: 3  Anesthesia Plan: General ETT   Post-op Pain Management: Tylenol  PO (pre-op)* and Toradol  IV (intra-op)*   Induction: Intravenous  PONV Risk Score and Plan: 2 and Ondansetron , Dexamethasone , Midazolam  and Treatment may vary due to age or medical condition  Airway Management Planned: Oral ETT  Additional Equipment:   Intra-op Plan:   Post-operative Plan: Extubation in OR  Informed Consent: I have reviewed the patients History and Physical, chart, labs and discussed the procedure including the risks, benefits and alternatives for the proposed anesthesia with the patient or authorized representative who has indicated his/her understanding and acceptance.     Dental Advisory Given  Plan Discussed with: Anesthesiologist, CRNA and Surgeon  Anesthesia Plan Comments: (Patient consented for risks of anesthesia including but not limited to:  - adverse reactions to medications - damage to eyes, teeth, lips or other oral mucosa - nerve damage due to positioning  - sore throat or hoarseness - Damage to heart, brain, nerves, lungs, other parts of body or loss of life  Patient voiced understanding and assent.)        Anesthesia Quick Evaluation  "

## 2024-09-08 NOTE — Interval H&P Note (Signed)
 History and Physical Interval Note:  09/08/2024 9:21 AM  Joshua [name]  has presented today for surgery, with the diagnosis of K64.4  hemorrhoids.  The various methods of treatment have been discussed with the patient and family. After consideration of risks, benefits and other options for treatment, the patient has consented to  Procedures: HEMORRHOIDECTOMY (N/A) as a surgical intervention.  The patient's history has been reviewed, patient examined, no change in status, stable for surgery.  I have reviewed the patient's chart and labs.  Questions were answered to the patient's satisfaction.     [name]

## 2024-09-09 ENCOUNTER — Encounter: Payer: Self-pay | Admitting: Surgery

## 2024-09-09 LAB — SURGICAL PATHOLOGY

## 2024-09-11 ENCOUNTER — Other Ambulatory Visit: Payer: Self-pay

## 2024-09-29 ENCOUNTER — Telehealth: Payer: Self-pay

## 2024-09-29 NOTE — Telephone Encounter (Signed)
 Patient called stating he believed he missed his appointment. He reported having a seizure yesterday and stated he has difficulty remembering things. Patient was informed that his upcoming appointment is scheduled for 10/03/2024 at 8:20 AM. He was advised to contact [facility] for any additional questions and was provided the phone number [phone]. An offer was made to transfer the call; however, the patient disconnected the call before transfer could be completed.
